# Patient Record
Sex: Female | Born: 1960 | Race: White | Hispanic: No | Marital: Married | Smoking: Current every day smoker
Health system: Southern US, Community
[De-identification: ages and names within clinical notes are randomized; demographics above are authoritative.]

## PROBLEM LIST (undated history)

## (undated) DIAGNOSIS — K219 Gastro-esophageal reflux disease without esophagitis: Secondary | ICD-10-CM

## (undated) DIAGNOSIS — E039 Hypothyroidism, unspecified: Secondary | ICD-10-CM

## (undated) DIAGNOSIS — Z8719 Personal history of other diseases of the digestive system: Secondary | ICD-10-CM

## (undated) DIAGNOSIS — IMO0002 Reserved for concepts with insufficient information to code with codable children: Secondary | ICD-10-CM

## (undated) DIAGNOSIS — N823 Fistula of vagina to large intestine: Secondary | ICD-10-CM

## (undated) DIAGNOSIS — N183 Chronic kidney disease, stage 3 unspecified: Secondary | ICD-10-CM

## (undated) DIAGNOSIS — T451X5A Adverse effect of antineoplastic and immunosuppressive drugs, initial encounter: Secondary | ICD-10-CM

## (undated) DIAGNOSIS — M4302 Spondylolysis, cervical region: Secondary | ICD-10-CM

## (undated) DIAGNOSIS — F1120 Opioid dependence, uncomplicated: Secondary | ICD-10-CM

## (undated) DIAGNOSIS — R234 Changes in skin texture: Secondary | ICD-10-CM

## (undated) DIAGNOSIS — I671 Cerebral aneurysm, nonruptured: Secondary | ICD-10-CM

## (undated) DIAGNOSIS — C50919 Malignant neoplasm of unspecified site of unspecified female breast: Secondary | ICD-10-CM

## (undated) DIAGNOSIS — M47816 Spondylosis without myelopathy or radiculopathy, lumbar region: Secondary | ICD-10-CM

## (undated) DIAGNOSIS — M542 Cervicalgia: Secondary | ICD-10-CM

## (undated) DIAGNOSIS — Z87442 Personal history of urinary calculi: Secondary | ICD-10-CM

## (undated) DIAGNOSIS — J189 Pneumonia, unspecified organism: Secondary | ICD-10-CM

## (undated) DIAGNOSIS — Z972 Presence of dental prosthetic device (complete) (partial): Secondary | ICD-10-CM

## (undated) DIAGNOSIS — R0600 Dyspnea, unspecified: Secondary | ICD-10-CM

## (undated) DIAGNOSIS — Z973 Presence of spectacles and contact lenses: Secondary | ICD-10-CM

## (undated) DIAGNOSIS — R239 Unspecified skin changes: Secondary | ICD-10-CM

## (undated) DIAGNOSIS — I2510 Atherosclerotic heart disease of native coronary artery without angina pectoris: Secondary | ICD-10-CM

## (undated) DIAGNOSIS — U071 COVID-19: Secondary | ICD-10-CM

## (undated) DIAGNOSIS — F419 Anxiety disorder, unspecified: Secondary | ICD-10-CM

## (undated) DIAGNOSIS — G620 Drug-induced polyneuropathy: Secondary | ICD-10-CM

## (undated) DIAGNOSIS — G43709 Chronic migraine without aura, not intractable, without status migrainosus: Secondary | ICD-10-CM

## (undated) DIAGNOSIS — J439 Emphysema, unspecified: Secondary | ICD-10-CM

## (undated) DIAGNOSIS — F17210 Nicotine dependence, cigarettes, uncomplicated: Secondary | ICD-10-CM

## (undated) DIAGNOSIS — Z9221 Personal history of antineoplastic chemotherapy: Secondary | ICD-10-CM

## (undated) HISTORY — PX: OTHER SURGICAL HISTORY: SHX169

## (undated) HISTORY — PX: EYE SURGERY: SHX253

## (undated) HISTORY — PX: CYSTOSCOPY/URETEROSCOPY/HOLMIUM LASER/STENT PLACEMENT: SHX6546

---

## 1990-07-20 HISTORY — PX: APPENDECTOMY: SHX54

## 1994-07-20 HISTORY — PX: TOTAL ABDOMINAL HYSTERECTOMY W/ BILATERAL SALPINGOOPHORECTOMY: SHX83

## 1997-07-20 HISTORY — PX: CHOLECYSTECTOMY: SHX55

## 1997-12-26 ENCOUNTER — Ambulatory Visit (HOSPITAL_COMMUNITY): Admission: RE | Admit: 1997-12-26 | Discharge: 1997-12-26 | Payer: Self-pay | Admitting: Obstetrics and Gynecology

## 1998-01-02 ENCOUNTER — Other Ambulatory Visit: Admission: RE | Admit: 1998-01-02 | Discharge: 1998-01-02 | Payer: Self-pay | Admitting: Obstetrics and Gynecology

## 1999-07-21 DIAGNOSIS — Z8719 Personal history of other diseases of the digestive system: Secondary | ICD-10-CM

## 1999-07-21 HISTORY — DX: Personal history of other diseases of the digestive system: Z87.19

## 1999-09-17 ENCOUNTER — Inpatient Hospital Stay (HOSPITAL_COMMUNITY): Admission: EM | Admit: 1999-09-17 | Discharge: 1999-09-18 | Payer: Self-pay | Admitting: Emergency Medicine

## 1999-11-07 ENCOUNTER — Ambulatory Visit (HOSPITAL_COMMUNITY): Admission: RE | Admit: 1999-11-07 | Discharge: 1999-11-07 | Payer: Self-pay | Admitting: Gastroenterology

## 1999-11-07 ENCOUNTER — Encounter: Payer: Self-pay | Admitting: Gastroenterology

## 1999-11-26 ENCOUNTER — Encounter: Payer: Self-pay | Admitting: Gastroenterology

## 1999-11-26 ENCOUNTER — Ambulatory Visit (HOSPITAL_COMMUNITY): Admission: RE | Admit: 1999-11-26 | Discharge: 1999-11-26 | Payer: Self-pay | Admitting: Gastroenterology

## 2000-01-04 ENCOUNTER — Emergency Department (HOSPITAL_COMMUNITY): Admission: EM | Admit: 2000-01-04 | Discharge: 2000-01-04 | Payer: Self-pay

## 2000-01-15 ENCOUNTER — Encounter: Admission: RE | Admit: 2000-01-15 | Discharge: 2000-01-15 | Payer: Self-pay | Admitting: Otolaryngology

## 2000-01-15 ENCOUNTER — Encounter: Payer: Self-pay | Admitting: Otolaryngology

## 2000-05-06 ENCOUNTER — Ambulatory Visit (HOSPITAL_COMMUNITY): Admission: RE | Admit: 2000-05-06 | Discharge: 2000-05-06 | Payer: Self-pay | Admitting: Gastroenterology

## 2000-05-19 ENCOUNTER — Encounter: Admission: RE | Admit: 2000-05-19 | Discharge: 2000-05-19 | Payer: Self-pay | Admitting: Family Medicine

## 2000-05-19 ENCOUNTER — Encounter: Payer: Self-pay | Admitting: Family Medicine

## 2000-08-16 ENCOUNTER — Ambulatory Visit (HOSPITAL_COMMUNITY): Admission: RE | Admit: 2000-08-16 | Discharge: 2000-08-16 | Payer: Self-pay | Admitting: Gastroenterology

## 2000-10-07 ENCOUNTER — Encounter: Payer: Self-pay | Admitting: Surgery

## 2000-10-11 HISTORY — PX: LAPAROSCOPIC NISSEN FUNDOPLICATION: SHX1932

## 2000-10-12 ENCOUNTER — Inpatient Hospital Stay (HOSPITAL_COMMUNITY): Admission: EM | Admit: 2000-10-12 | Discharge: 2000-10-13 | Payer: Self-pay | Admitting: Surgery

## 2001-04-18 ENCOUNTER — Encounter: Payer: Self-pay | Admitting: Otolaryngology

## 2001-04-18 ENCOUNTER — Encounter: Admission: RE | Admit: 2001-04-18 | Discharge: 2001-04-18 | Payer: Self-pay | Admitting: Otolaryngology

## 2001-04-19 ENCOUNTER — Encounter: Payer: Self-pay | Admitting: Internal Medicine

## 2001-04-19 ENCOUNTER — Encounter: Admission: RE | Admit: 2001-04-19 | Discharge: 2001-04-19 | Payer: Self-pay | Admitting: Internal Medicine

## 2001-04-21 ENCOUNTER — Ambulatory Visit (HOSPITAL_COMMUNITY): Admission: RE | Admit: 2001-04-21 | Discharge: 2001-04-21 | Payer: Self-pay | Admitting: Internal Medicine

## 2001-04-26 ENCOUNTER — Ambulatory Visit: Admission: RE | Admit: 2001-04-26 | Discharge: 2001-04-26 | Payer: Self-pay | Admitting: Pulmonary Disease

## 2001-07-07 ENCOUNTER — Ambulatory Visit: Admission: RE | Admit: 2001-07-07 | Discharge: 2001-07-07 | Payer: Self-pay | Admitting: Pulmonary Disease

## 2002-08-23 ENCOUNTER — Ambulatory Visit (HOSPITAL_BASED_OUTPATIENT_CLINIC_OR_DEPARTMENT_OTHER): Admission: RE | Admit: 2002-08-23 | Discharge: 2002-08-23 | Payer: Self-pay | Admitting: Internal Medicine

## 2003-01-18 ENCOUNTER — Inpatient Hospital Stay (HOSPITAL_COMMUNITY): Admission: EM | Admit: 2003-01-18 | Discharge: 2003-01-21 | Payer: Self-pay | Admitting: Emergency Medicine

## 2003-01-18 ENCOUNTER — Encounter: Payer: Self-pay | Admitting: Internal Medicine

## 2003-01-19 ENCOUNTER — Encounter: Payer: Self-pay | Admitting: Internal Medicine

## 2003-01-21 ENCOUNTER — Encounter (INDEPENDENT_AMBULATORY_CARE_PROVIDER_SITE_OTHER): Payer: Self-pay | Admitting: Specialist

## 2003-02-20 ENCOUNTER — Observation Stay (HOSPITAL_COMMUNITY): Admission: EM | Admit: 2003-02-20 | Discharge: 2003-02-21 | Payer: Self-pay | Admitting: Emergency Medicine

## 2005-01-28 ENCOUNTER — Inpatient Hospital Stay (HOSPITAL_COMMUNITY): Admission: AD | Admit: 2005-01-28 | Discharge: 2005-01-29 | Payer: Self-pay | Admitting: Internal Medicine

## 2005-01-29 ENCOUNTER — Ambulatory Visit: Payer: Self-pay | Admitting: Psychiatry

## 2005-12-25 ENCOUNTER — Emergency Department (HOSPITAL_COMMUNITY): Admission: EM | Admit: 2005-12-25 | Discharge: 2005-12-25 | Payer: Self-pay | Admitting: Emergency Medicine

## 2006-09-25 ENCOUNTER — Emergency Department (HOSPITAL_COMMUNITY): Admission: EM | Admit: 2006-09-25 | Discharge: 2006-09-25 | Payer: Self-pay | Admitting: Emergency Medicine

## 2009-07-28 ENCOUNTER — Emergency Department (HOSPITAL_COMMUNITY): Admission: EM | Admit: 2009-07-28 | Discharge: 2009-07-28 | Payer: Self-pay | Admitting: Family Medicine

## 2009-07-28 ENCOUNTER — Emergency Department (HOSPITAL_COMMUNITY): Admission: EM | Admit: 2009-07-28 | Discharge: 2009-07-28 | Payer: Self-pay | Admitting: Emergency Medicine

## 2010-12-05 NOTE — Procedures (Signed)
[place]  Patient:    Stacy Fernandez, Stacy Fernandez                  MRN: [id number] Proc. Date: 08/16/00 Adm. Date:  [id number] Disc. Date: [id number] Attending:  Charmaine Downs CC:         Ulyess Mort, M.D. [facility]   Procedure Report  PROCEDURE PERFORMED:  Esophageal manometry.  Upper esophageal sphincter:  There appears to be normal coordination between pharyngeal contractions and cricopharyngeal relaxation.  Lower esophageal sphincter:  Lower esophageal sphincter pressure is elevated to 49 mmHg with normal relaxation to swallowing.  Motility pattern:  There is a mixture of aperistaltic and peristaltic waves (approximately 50%) throughout the length of the esohpagus with wet and dry swallows.  Mean amplitude of contractions is elevated to 195 mmHg, normal being less than 180.  There were no repetitive, spontaneous, or prolonged contractions to suggest esophageal spasm.  ASSESSMENT:  This is a nonspecific esophageal motility disorder which is probably secondary to the patients using metoclopramide therapy. DD:  09/13/00 TD:  09/14/00 Job: [id number]

## 2010-12-05 NOTE — Procedures (Signed)
[facility]  Patient:    Stacy Fernandez, Stacy Fernandez Visit Number: [id number] MRN: [id number]          Service Type: DSU Location: CARD Attending Physician:  Merwyn Katos Dictated by:   Oley Balm Sung Amabile, M.D. [facility] Proc. Date: 07/07/01 Admit Date:  07/07/2001 Discharge Date: 07/07/2001                             Procedure Report  PROCEDURE:  Bronchoscopy.  DESCRIPTION OF PROCEDURE:  1960/08/08  INDICATIONS:  Persistent atelectasis versus scarring adjacent to the horizontal fissure--rule out endobronchial obstruction.  PREMEDICATION:  Fentanyl 100 mcg IV, Versed 4 mg IV.  ANESTHESIA:  Topical anesthesia was applied to the nose and throat. 60 cc of 1% lidocaine were used during the course of the procedure.  DESCRIPTION OF PROCEDURE:  After adequate sedation and anesthesia, the bronchoscope was introduced via the right naris and advanced into the posterior pharynx. This demonstrated a normal upper airway anatomy. The vocal cords moved symmetrically. Further anesthesia was achieved with 1% lidocaine and the scope was advanced into the trachea. Complete airway anesthesia was achieved with 1% lidocaine and a thorough airway examination was performed. This demonstrated normal segmental airway anatomy. Bronchial mucosal was notably normal. There were no endobronchial lesions such as tumors or foreign bodies. There was no evidence of bleeding. There were scant secretions. After completion of the airway examination, the procedure was terminated. No specimens were submitted. Dictated by:   Oley Balm Sung Amabile, M.D. [facility] Attending Physician:  Merwyn Katos DD:  07/15/01 TD:  07/16/01 Job: [id number]

## 2010-12-05 NOTE — Op Note (Signed)
Stacy Fernandez, Stacy Fernandez                  ACCOUNT NO.:  [id number]   MEDICAL RECORD NO.:  [id number]                   PATIENT TYPE:  INP   LOCATION:  0342                                 FACILITY:  [facility]   PHYSICIAN:  Lina Sar, M.D. [facility]               DATE OF BIRTH:  Nov 24, 1960   DATE OF PROCEDURE:  01/21/2003  DATE OF DISCHARGE:                                 OPERATIVE REPORT   NAME OF PROCEDURE:  Colonoscopy.   ENDOSCOPIST:  Hedwig Morton. Juanda Chance, M.D. [facility]   INDICATIONS:  This 50 year old white female was admitted with severe lower  abdominal pain mostly in the left lower quadrant and hematochezia which has  continued throughout the hospitalization.  A CT scan of the abdomen was  negative.  All her chemistries were negative.  She gave a history of  questionable Crohn's disease.  She is undergoing colonoscopy because of  continued abdominal pain, but negative laboratory tests with radiographic  studies.   ENDOSCOPE:  Olympus single-channel video endoscope.   SEDATION:  1. Versed 5 mg IV.  2. Fentanyl 50 mcg IV.   DESCRIPTION OF PROCEDURE:  The Olympus single-channel video endoscope was  passed under direct vision in the rectum to the sigmoid colon.  The patient  was monitored by pulse oximetry.  Oxygen saturations were 89-95% on 2 L of  nasal oxygen.  Her prep was excellent.   Ther anal canal showed erosions in the anal canal which were most likely  related to the prep.  There was no active bleeding.  Retroflexion of the  endoscope in the rectum showed no significant hemorrhoids.   The sigmoid colon mucosa was normal.  The colonoscope passed easily through  the sigmoid colon which showed normal appearing folds, no diverticulosis.  The descending colon, splenic flexure, transverse colon, and hepatic flexure  were unremarkable.  There was normal submucosal vascular pattern.  There was  no abnormal tortuosity.  The right colon was reached without difficulty.  The cecal  pouch appeared normal.  It was easily distensible.  The appendical  opening was identified.  The ileocecal valve appeared normal.  The terminal  ileum was not entered.   The colonoscope was then retracted and the colon decompressed.  Random  biopsies were taken from the right transverse colon to rule out microscopic  colitis.   The patient tolerated the procedure well.   IMPRESSION:  1. Essentially normal colonoscopy to the cecum, status post random biopsies     of the left and right colon.  2. Minor anal erosions.    PLAN:  There is no structural abnormality of the colon to explain the  patient's symptoms which may due to irritable bowel syndrome.  Since all her  chemistries sent and CT scan are normal, she will be treated for IBS with  antispasmodics, dietary modifications of high-fiber diet and fiber  supplements.  She will follow up with Dr. Lovell Sheehan.  Lina Sar, M.D. [facility]    DB/MEDQ  D:  01/21/2003  T:  01/21/2003  Job:  [id number]   cc:   Della Goo, M.D.

## 2010-12-05 NOTE — Op Note (Signed)
[facility]  Patient:    Stacy Fernandez, Stacy Fernandez               MRN: [id number] Proc. Date: 10/11/00 Adm. Date:  [id number] Attending:  Katha Cabal CC:         Tammy R. Collins Scotland, M.D.  Ulyess Mort, M.D. 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  PROCEDURE:  Laparoscopic Nissen fundoplication over a #56 dilator, closure of the crura.  SURGEON:  Dr. Daphine Deutscher.  ASSISTANT:  Dr. Derrell Lolling.  DESCRIPTION OF PROCEDURE:  The patient was taken to room one, given general anesthesia. The abdomen was prepped with Betadine and draped sterilely. A longitudinal incision was made just above the umbilicus and through a pursestring suture and using Hasson technique, the Hasson cannula was inserted. The abdomen was insufflated. There were some adhesions in the right upper quadrant from a previous cholecystectomy, these were taken down. A 5 mm was placed in the upper abdomen, 10/11 in the left upper quadrant, another 10/11 on the left side for the camera and a right sided port for the operation.  I incised the gastrohepatic ligament first and went up and then dissected the right crus. I carried this anteriorly across the esophagus and down and dissected the left crus.  Next I went over and took down short gastric vessels putting clips on some large short gastrics and then using the harmonic on the remaining portion. I carried this up to the EG junction and had completely dissected the hiatus at this point. I easily created a window and we enlarged the window and put a Penrose drain around this and used that to retract the esophagus. With that in place, I put a single suture through the crura but the knot broke in tying. A second one was placed in the same area. Again  this was a simple suture placed with the  endostitch and this was tied down extracorporeally and approximated the crura nicely. There was essentially no hiatal hernia present. A 56 dilator was passed by Dr. Dorathy Kinsman easily. The wrap was brought around and a contiguous portion of the fundus was identified. I then sutured this to itself and to the esophagus with three sutures using the endostitch. The upper two were tied intracorporeally and the third one was tied extracorporeally. These had clips put on each of these. There was noted to be a small capsular evulsion on the lower portion of the spleen and a piece of Surgicel had been placed on this and it was not bleeding. The wrap looked good and healthy and appeared to be in good position. The patient tolerated the procedure well. I then closed the Hasson port under direct vision. We then removed the remaining ports and injected them with Marcaine. The skin was closed with 4-0 Vicryl with benzoin and Steri-Strips. The patient seemed to tolerate the procedure well and was taken to the recovery room in satisfactory condition. DD:  10/11/00 TD:  10/11/00 Job: [id number]

## 2010-12-05 NOTE — Discharge Summary (Signed)
   Stacy Fernandez, Stacy Fernandez                  ACCOUNT NO.:  [id number]   MEDICAL RECORD NO.:  [id number]                   PATIENT TYPE:  INP   LOCATION:  0342                                 FACILITY:  [facility]   PHYSICIAN:  Della Goo, M.D.              DATE OF BIRTH:  02-Feb-1961   DATE OF ADMISSION:  01/18/2003  DATE OF DISCHARGE:  01/21/2003                                 DISCHARGE SUMMARY   FINAL DIAGNOSES:  1. Irritable bowel syndrome.  2. Rectal bleeding due to rectal area ulcers seen on colonoscopy January 21, 2003.  3. Anemia of iron deficiency.  4. Chronic obstructive pulmonary disease/emphysema.  5. Tobacco abuse.  6. Anxiety/depression.   HOSPITAL COURSE:  A 50 year old Caucasian female with complaints of  worsening abdominal pain and episodic rectal bleeding referred from the  office for direct admission due to her symptoms.  The patient also had  complaints of dizziness, weakness, and orthostasis and was unable to hold  down foods and liquids.  The patient was admitted to [facility].  A CT scan of the abdomen and KUB were performed.  KUB revealed nonspecific  bowel gas pattern, no ileus, no obstruction.  CT scan performed also was  unremarkable.  Lina Sar, M.D. [facility] was consulted for evaluation who saw  patient on January 18, 2003 and made recommendations and plans for colonoscopy.  The patient was placed on bowel rest, IV fluids, pain medications to control  pain symptoms.  She was also placed on nausea medications and a clear liquid  diet initially which was advanced as tolerated.  The patient was made n.p.o.  prior to her colonoscopy which was performed on the a.m. of January 21, 2003.  This procedure was performed without complications, findings of which were  negative except for findings of distal rectal area ulcerations x3.  Biopsies  were obtained and pathology was pending on discharge.  The patient was  discharged from the hospital on January 21, 2003  to follow up in the office for  reevaluation in five to seven days.  The patient's symptoms were consistent  with irritable bowel syndrome.  Medical therapy was started with  glycopyrrolate for antispasmodic therapy.  The patient was also placed on  Anusol hydrocortisone ointment for the rectal ulcerations.  The patient's  hemoglobin level was stable.  Her vitals were stable and she was tolerating  a regular diet and was discharged to home January 21, 2003.                                               Della Goo, M.D.    HJ/MEDQ  D:  02/08/2003  T:  02/08/2003  Job:  [id number]

## 2010-12-05 NOTE — Procedures (Signed)
[place]  Patient:    Stacy Fernandez, Stacy Fernandez                  MRN: [id number] Proc. Date: 08/23/00 Adm. Date:  [id number] Disc. Date: [id number] Attending:  Charmaine Downs CC:         Ulyess Mort, M.D. [facility]             Matthew B. Daphine Deutscher, M.D.                           Procedure Report  PROCEDURE:  Esophageal Manometry was performed in the usual pull-through technique.  FINDINGS: 1. Upper esophageal sphincter pressure, contractions, and relaxation were    normal. 2. In the esophageal body, there were normal peristaltic contractions in the    upper esophageal body.  In the lower esophageal body, there were four out    of eight nontransmitted contractions.  Three were retrograde.  Amplitude    and duration were normal. 3. LES resting pressure was 48.6 with 98% relaxation.  IMPRESSION:  Very nonspecific motor disorder of the esophagus. DD:  08/23/00 TD:  08/23/00 Job: [id number]

## 2012-01-27 ENCOUNTER — Other Ambulatory Visit: Payer: Self-pay | Admitting: Family Medicine

## 2012-01-27 DIAGNOSIS — Z1231 Encounter for screening mammogram for malignant neoplasm of breast: Secondary | ICD-10-CM

## 2012-02-09 ENCOUNTER — Ambulatory Visit
Admission: RE | Admit: 2012-02-09 | Discharge: 2012-02-09 | Disposition: A | Discharge status: Home / Self Care | Payer: BC Managed Care – PPO | Source: Ambulatory Visit | Attending: Family Medicine | Admitting: Family Medicine

## 2012-02-09 DIAGNOSIS — Z1231 Encounter for screening mammogram for malignant neoplasm of breast: Secondary | ICD-10-CM

## 2012-03-11 ENCOUNTER — Other Ambulatory Visit: Payer: Self-pay | Admitting: Gastroenterology

## 2012-09-29 ENCOUNTER — Other Ambulatory Visit: Payer: Self-pay | Admitting: Neurology

## 2012-10-03 ENCOUNTER — Other Ambulatory Visit: Payer: Self-pay | Admitting: Neurology

## 2012-10-03 DIAGNOSIS — R51 Headache: Secondary | ICD-10-CM

## 2012-10-06 ENCOUNTER — Ambulatory Visit (INDEPENDENT_AMBULATORY_CARE_PROVIDER_SITE_OTHER): Payer: BC Managed Care – PPO

## 2012-10-06 ENCOUNTER — Other Ambulatory Visit: Payer: Self-pay | Admitting: Neurology

## 2012-10-06 DIAGNOSIS — R51 Headache: Secondary | ICD-10-CM

## 2012-10-06 DIAGNOSIS — R42 Dizziness and giddiness: Secondary | ICD-10-CM

## 2012-10-06 DIAGNOSIS — Z0289 Encounter for other administrative examinations: Secondary | ICD-10-CM

## 2012-10-07 ENCOUNTER — Telehealth: Payer: Self-pay | Admitting: Neurology

## 2012-10-07 NOTE — Procedures (Signed)
[facility]  NEUROIMAGING REPORT   STUDY DATE: 10/06/12 PATIENT NAME: Stacy Fernandez DOB: [birth year] MRN: [id number]  ORDERING CLINICIAN: York Spaniel, MD  CLINICAL HISTORY: 52 year old female with headaches and dizziness.  EXAM: MRA head (without)  TECHNIQUE: MR angiogram of the head was obtained utilizing 3D time of flight sequences from below the vertebrobasilar junction up to the intracranial vasculature without contrast.  Computerized reconstructions were obtained. CONTRAST: no IMAGING SITE: [facility] [address]   FINDINGS:  This study is of adequate technical quality. Flow signal of the bilateral internal carotid arteries have no stenosis. The bilateral middle and anterior cerebral arteries have no stenosis. Trifurcation of the anterior cerebral arteries (A2 segments) is a normal variant. The bilateral vertebral, basilar, bilateral posterior cerebral arteries have no stenosis. Small 2mm projection near the left supraclinoid internal carotid artery may represent small aneurysm vs. infundibulum of hypoplastic posterior communicating artery.   IMPRESSION:  Abnormal MRA head (without) demonstrating: 1. Small 2mm projection of the left supraclinoid internal carotid artery may represent small aneurysm vs. infundibulum.  2. Remainder of medium-large sized intracranial arteries are unremarkable.   INTERPRETING PHYSICIAN:  Suanne Marker, MD Certified in Neurology, Neurophysiology and Neuroimaging  [facility] [address], [address] [place], [place] [address] [phone]

## 2012-10-07 NOTE — Procedures (Signed)
[facility]  NEUROIMAGING REPORT   STUDY DATE: 10/06/12 PATIENT NAME: Stacy Fernandez DOB: 10-10-1960 MRN: [id number]  ORDERING CLINICIAN: York Spaniel, MD  CLINICAL HISTORY: 52 year old female with headaches and dizziness.  EXAM: MRI brain (without)  TECHNIQUE: MRI of the brain without contrast was obtained utilizing 5 mm axial slices with T1, T2, T2 flair, T2 star gradient echo and diffusion weighted views.  T1 sagittal and T2 coronal views were obtained. CONTRAST: no IMAGING SITE: [facility] [address]   FINDINGS:  No abnormal lesions are seen on diffusion-weighted views to suggest acute ischemia. The cortical sulci, fissures and cisterns are normal in size and appearance. Lateral, third and fourth ventricle are normal in size and appearance. No extra-axial fluid collections are seen. No evidence of mass effect or midline shift.    On sagittal views the posterior fossa, pituitary gland and corpus callosum are unremarkable. No evidence of intracranial hemorrhage on gradient-echo views. The orbits and their contents, paranasal sinuses and calvarium are unremarkable.  Intracranial flow voids are present.  IMPRESSION:  Normal MRI brain (without).   INTERPRETING PHYSICIAN:  Suanne Marker, MD Certified in Neurology, Neurophysiology and Neuroimaging  [facility] [address], [address] [place], [place] [address] [phone]

## 2012-10-07 NOTE — Telephone Encounter (Signed)
I called the patient. She has had MRI evaluation of the brain, and this is normal. The patient is to have a carotid Doppler study in the near future. I'll contact her when I get that result.

## 2012-10-08 ENCOUNTER — Telehealth: Payer: Self-pay | Admitting: Neurology

## 2012-10-08 NOTE — Telephone Encounter (Signed)
I called the patient. The MRI brain is normal. 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 Doppler is pending.

## 2012-10-11 ENCOUNTER — Other Ambulatory Visit: Payer: BC Managed Care – PPO

## 2012-10-18 ENCOUNTER — Ambulatory Visit (INDEPENDENT_AMBULATORY_CARE_PROVIDER_SITE_OTHER): Payer: Self-pay

## 2012-10-18 DIAGNOSIS — R42 Dizziness and giddiness: Secondary | ICD-10-CM

## 2012-10-24 ENCOUNTER — Telehealth: Payer: Self-pay | Admitting: Neurology

## 2012-10-24 MED ORDER — PROPRANOLOL HCL 10 MG PO TABS: ORAL_TABLET | ORAL | Status: DC

## 2012-10-24 NOTE — Telephone Encounter (Signed)
I called patient. The carotid Doppler study is unremarkable. The patient continues to have headaches 2 or 3 times a week. I will start propranolol. The patient was on Topamax without benefit. She is also on gabapentin. The increase in the Effexor has not helped her.

## 2012-11-15 ENCOUNTER — Telehealth: Payer: Self-pay

## 2012-11-15 MED ORDER — PROPRANOLOL HCL 40 MG PO TABS: 40.0000 mg | ORAL_TABLET | Freq: Two times a day (BID) | ORAL | Status: DC

## 2012-11-15 NOTE — Telephone Encounter (Signed)
I called patient. The patient continues to have relatively frequent headaches. The patient also has some left-sided neck and shoulder discomfort. The patient got some benefit from the low-dose propranolol, I'll increase the dose to 40 mg twice daily. The patient may be a candidate for Botox injections in the future.

## 2012-11-15 NOTE — Telephone Encounter (Signed)
Patient called and left message in clinic saying the medication prescribed is not helping and she is having frequent headaches.  She would like something else prescribed or suggestions as to what else she needs to do.

## 2012-12-13 ENCOUNTER — Encounter (INDEPENDENT_AMBULATORY_CARE_PROVIDER_SITE_OTHER): Payer: BC Managed Care – PPO

## 2012-12-13 DIAGNOSIS — R079 Chest pain, unspecified: Secondary | ICD-10-CM

## 2012-12-13 DIAGNOSIS — R0602 Shortness of breath: Secondary | ICD-10-CM

## 2012-12-13 LAB — PULMONARY FUNCTION TEST

## 2012-12-22 ENCOUNTER — Encounter: Payer: Self-pay | Admitting: Cardiovascular Disease

## 2012-12-25 ENCOUNTER — Inpatient Hospital Stay (HOSPITAL_COMMUNITY)
Admission: EM | Admit: 2012-12-25 | Discharge: 2012-12-27 | DRG: 124 | Disposition: A | Discharge status: Home / Self Care | Payer: BC Managed Care – PPO | Attending: Cardiovascular Disease | Admitting: Cardiovascular Disease

## 2012-12-25 ENCOUNTER — Inpatient Hospital Stay (HOSPITAL_COMMUNITY): Payer: BC Managed Care – PPO

## 2012-12-25 ENCOUNTER — Emergency Department (HOSPITAL_COMMUNITY): Payer: BC Managed Care – PPO

## 2012-12-25 ENCOUNTER — Encounter (HOSPITAL_COMMUNITY): Payer: Self-pay | Admitting: Emergency Medicine

## 2012-12-25 DIAGNOSIS — E039 Hypothyroidism, unspecified: Secondary | ICD-10-CM | POA: Diagnosis present

## 2012-12-25 DIAGNOSIS — I200 Unstable angina: Principal | ICD-10-CM | POA: Diagnosis present

## 2012-12-25 DIAGNOSIS — Z8249 Family history of ischemic heart disease and other diseases of the circulatory system: Secondary | ICD-10-CM

## 2012-12-25 DIAGNOSIS — R079 Chest pain, unspecified: Secondary | ICD-10-CM

## 2012-12-25 DIAGNOSIS — Z8679 Personal history of other diseases of the circulatory system: Secondary | ICD-10-CM

## 2012-12-25 DIAGNOSIS — F17200 Nicotine dependence, unspecified, uncomplicated: Secondary | ICD-10-CM | POA: Diagnosis present

## 2012-12-25 DIAGNOSIS — Z720 Tobacco use: Secondary | ICD-10-CM | POA: Diagnosis present

## 2012-12-25 DIAGNOSIS — Z7982 Long term (current) use of aspirin: Secondary | ICD-10-CM

## 2012-12-25 HISTORY — DX: Hypothyroidism, unspecified: E03.9

## 2012-12-25 LAB — URINALYSIS, ROUTINE W REFLEX MICROSCOPIC
Bilirubin Urine: NEGATIVE
Glucose, UA: NEGATIVE mg/dL
Ketones, ur: NEGATIVE mg/dL
Nitrite: NEGATIVE
Protein, ur: NEGATIVE mg/dL
Specific Gravity, Urine: 1.027 (ref 1.005–1.030)
Urobilinogen, UA: 0.2 mg/dL (ref 0.0–1.0)
pH: 5 (ref 5.0–8.0)

## 2012-12-25 LAB — POCT I-STAT, CHEM 8
Chloride: 108 mEq/L (ref 96–112)
Glucose, Bld: 93 mg/dL (ref 70–99)
HCT: 33 % — ABNORMAL LOW (ref 36.0–46.0)
Potassium: 3.8 mEq/L (ref 3.5–5.1)
Sodium: 142 mEq/L (ref 135–145)

## 2012-12-25 LAB — COMPREHENSIVE METABOLIC PANEL
ALT: 15 U/L (ref 0–35)
AST: 19 U/L (ref 0–37)
CO2: 25 mEq/L (ref 19–32)
Calcium: 9.2 mg/dL (ref 8.4–10.5)
Chloride: 108 mEq/L (ref 96–112)
GFR calc non Af Amer: 79 mL/min — ABNORMAL LOW (ref >90)
Sodium: 140 mEq/L (ref 135–145)

## 2012-12-25 LAB — CBC
HCT: 34.1 % — ABNORMAL LOW (ref 36.0–46.0)
Hemoglobin: 12.2 g/dL (ref 12.0–15.0)
MCH: 32.3 pg (ref 26.0–34.0)
MCHC: 35.8 g/dL (ref 30.0–36.0)
MCV: 90.2 fL (ref 78.0–100.0)
Platelets: 179 10*3/uL (ref 150–400)
RBC: 3.78 MIL/uL — ABNORMAL LOW (ref 3.87–5.11)
RDW: 12.4 % (ref 11.5–15.5)
WBC: 8.5 10*3/uL (ref 4.0–10.5)

## 2012-12-25 LAB — POCT I-STAT TROPONIN I
Troponin i, poc: 0.01 ng/mL (ref 0.00–0.08)
Troponin i, poc: 0.01 ng/mL (ref 0.00–0.08)

## 2012-12-25 LAB — APTT: aPTT: 30 seconds (ref 24–37)

## 2012-12-25 LAB — URINE MICROSCOPIC-ADD ON

## 2012-12-25 LAB — TROPONIN I: Troponin I: <0.3 ng/mL (ref <0.30)

## 2012-12-25 MED ORDER — HEPARIN (PORCINE) IN NACL 100-0.45 UNIT/ML-% IJ SOLN
800.0000 [IU]/h | INTRAMUSCULAR | Status: DC
  Administered 2012-12-26: 750 [IU]/h via INTRAVENOUS
  Filled 2012-12-25 (×2): qty 250

## 2012-12-25 MED ORDER — NITROGLYCERIN IN D5W 200-5 MCG/ML-% IV SOLN
5.0000 ug/min | INTRAVENOUS | Status: DC
  Administered 2012-12-25: 5 ug/min via INTRAVENOUS
  Administered 2012-12-26: 20 ug/min via INTRAVENOUS
  Filled 2012-12-25: qty 250

## 2012-12-25 MED ORDER — HEPARIN BOLUS VIA INFUSION
3000.0000 [IU] | Freq: Once | INTRAVENOUS | Status: AC
  Administered 2012-12-26: 3000 [IU] via INTRAVENOUS

## 2012-12-25 MED ORDER — MORPHINE SULFATE 4 MG/ML IJ SOLN
4.0000 mg | Freq: Once | INTRAMUSCULAR | Status: AC
  Administered 2012-12-25: 4 mg via INTRAVENOUS
  Filled 2012-12-25: qty 1

## 2012-12-25 MED ORDER — NITROGLYCERIN 0.4 MG SL SUBL
0.4000 mg | SUBLINGUAL_TABLET | SUBLINGUAL | Status: DC | PRN
  Filled 2012-12-25: qty 25

## 2012-12-25 MED ORDER — ASPIRIN 81 MG PO CHEW: 324.0000 mg | CHEWABLE_TABLET | Freq: Once | ORAL | Status: DC

## 2012-12-25 MED ORDER — SODIUM CHLORIDE 0.9 % IV SOLN
1000.0000 mL | INTRAVENOUS | Status: DC
  Administered 2012-12-25: 1000 mL via INTRAVENOUS

## 2012-12-25 NOTE — ED Provider Notes (Signed)
History    CSN: [id number] Arrival date & time 12/25/12  1941 First MD Initiated Contact with Patient 12/25/12 1954      Chief Complaint  Patient presents with  . Chest Pain    HPI Comments: Chest pain started about 6:30 pm suddenly while at rest.  Patient is a 52 y.o. female presenting with chest pain. The history is provided by the patient.  Chest Pain Pain location:  L chest Pain radiates to:  Upper back and L arm Pain radiates to the back: yes   Pain severity:  Severe Onset quality:  Sudden Timing:  Constant Progression:  Worsening Chronicity:  Recurrent (Pt had a stress test two weeks ago.  sHe is scheduled to follow up with her cardiologist on tuesday.) Relieved by:  Nothing Ineffective treatments:  Aspirin Associated symptoms: nausea and shortness of breath   Associated symptoms: no cough and not vomiting   Risk factors: smoking   Risk factors: no coronary artery disease and no hypertension    the symptoms have been constant today. Nothing seems to make it any better.   Past Medical History  Diagnosis Date  . Hypothyroidism     No past surgical history on file.  No family history on file.  History  Substance Use Topics  . Smoking status: Pt smokes cigarettes  . Smokeless tobacco: Not on file  . Alcohol Use: Not on file    OB History   Grav Para Term Preterm Abortions TAB SAB Ect Mult Living                  Review of Systems  Respiratory: Positive for shortness of breath. Negative for cough.   Cardiovascular: Positive for chest pain.  Gastrointestinal: Positive for nausea. Negative for vomiting.  All other systems reviewed and are negative.    Allergies  Contrast media  Home Medications   Current Outpatient Rx  Name  Route  Sig  Dispense  Refill  . gabapentin (NEURONTIN) 100 MG capsule   Oral   Take 300 mg by mouth at bedtime.         Marland Kitchen HYDROcodone-acetaminophen (NORCO/VICODIN) 5-325 MG per tablet   Oral   Take 1-2 tablets by mouth  every 6 (six) hours as needed for pain.         Marland Kitchen levothyroxine (SYNTHROID, LEVOTHROID) 75 MCG tablet   Oral   Take 75 mcg by mouth daily before breakfast.         . nitroGLYCERIN (NITROSTAT) 0.4 MG SL tablet   Sublingual   Place 0.4 mg under the tongue every 5 (five) minutes as needed for chest pain.         . pantoprazole (PROTONIX) 40 MG tablet   Oral   Take 40 mg by mouth 2 (two) times daily.         . propranolol (INDERAL) 40 MG tablet   Oral   Take 1 tablet (40 mg total) by mouth 2 (two) times daily.   60 tablet   1   . venlafaxine XR (EFFEXOR-XR) 150 MG 24 hr capsule   Oral   Take 150 mg by mouth daily.           BP 118/77  Pulse 69  Temp(Src) 97.6 F (36.4 C) (Oral)  Resp 16  Ht 5' 6.5" (1.689 m)  Wt 130 lb (58.968 kg)  BMI 20.67 kg/m2  SpO2 100%  Physical Exam  Nursing note and vitals reviewed. Constitutional: She appears well-developed and well-nourished. No  distress.  HENT:  Head: Normocephalic and atraumatic.  Right Ear: External ear normal.  Left Ear: External ear normal.  Eyes: Conjunctivae are normal. Right eye exhibits no discharge. Left eye exhibits no discharge. No scleral icterus.  Neck: Neck supple. No tracheal deviation present.  Cardiovascular: Normal rate, regular rhythm and intact distal pulses.   Pulmonary/Chest: Effort normal and breath sounds normal. No stridor. No respiratory distress. She has no wheezes. She has no rales.  Abdominal: Soft. Bowel sounds are normal. She exhibits no distension. There is no tenderness. There is no rebound and no guarding.  Musculoskeletal: She exhibits no edema and no tenderness.  Neurological: She is alert. She has normal strength. No sensory deficit. Cranial nerve deficit:  no gross defecits noted. She exhibits normal muscle tone. She displays no seizure activity. Coordination normal.  Skin: Skin is warm and dry. No rash noted. She is not diaphoretic.  Psychiatric: She has a normal mood and  affect.    ED Course  Procedures (including critical care time)  Rate: 70  Rhythm: normal sinus rhythm  QRS Axis: normal  Intervals: normal  ST/T Wave abnormalities: normal  Conduction Disutrbances:none  Narrative Interpretation: nl  Old EKG Reviewed: none available Medications  0.9 %  sodium chloride infusion (1,000 mLs Intravenous New Bag/Given 12/25/12 2033)  aspirin chewable tablet 324 mg (324 mg Oral Not Given 12/25/12 2034)  nitroGLYCERIN (NITROSTAT) SL tablet 0.4 mg (not administered)  morphine 4 MG/ML injection 4 mg (not administered)  nitroGLYCERIN 0.2 mg/mL in dextrose 5 % infusion (not administered)  morphine 4 MG/ML injection 4 mg (4 mg Intravenous Given 12/25/12 2034)    Labs Reviewed  CBC - Abnormal; Notable for the following:    RBC 3.78 (*)    HCT 34.1 (*)    All other components within normal limits  COMPREHENSIVE METABOLIC PANEL - Abnormal; Notable for the following:    Total Bilirubin 0.2 (*)    GFR calc non Af Amer 79 (*)    All other components within normal limits  POCT I-STAT, CHEM 8 - Abnormal; Notable for the following:    Hemoglobin 11.2 (*)    HCT 33.0 (*)    All other components within normal limits  PROTIME-INR  APTT  D-DIMER, QUANTITATIVE  URINALYSIS, ROUTINE W REFLEX MICROSCOPIC  POCT I-STAT TROPONIN I   Dg Chest Portable 1 View  12/25/2012   *RADIOLOGY REPORT*  Clinical Data: Chest pain and shortness of breath.  PORTABLE CHEST - 1 VIEW  Comparison: 02/11/2012  Findings: Stable emphysematous lung disease.  No infiltrate, edema, pneumothorax or pleural fluid is identified.  Heart size and mediastinal contours are within normal limits.  IMPRESSION: Stable emphysema.  No acute findings.   Original Report Authenticated By: Irish Lack, M.D.    1. Chest pain     MDM  Her initial evaluation in the emergency Department is unremarkable. However her symptoms are concerning for acute coronary syndrome. Patient has been given nitroglycerin, morphine  and I will start her on heparin infusion. I will add on a d-dimer to screen for pulmonary embolism,  She'll be admitted to the hospital with plans for cardiac consultation.        Celene Kras, MD 12/25/12 2236

## 2012-12-25 NOTE — H&P (Signed)
[facility] History and Physical  Stacy Fernandez ZOX:[id number] DOB: Mar 19, 1961 DOA: 12/25/2012  Referring physician: Dr. Lynelle Doctor. PCP: Herb Grays, MD  Specialists: [facility].  Chief Complaint: Chest pain.  HPI: Stacy Fernandez is a 52 y.o. female with known history of hypothyroidism and ongoing tobacco abuse presented to the ER because of chest pain. Patient states she has been having chest pain off and on for last month and a half. She gets pressure-like symptoms of her known but this evening she started having chest pain radiating to the back and left arm with dizziness and diaphoresis. Since the pain is persistent and more than usual patient came to the ER. Chest x-ray EKG and cardiac enzymes are unremarkable. Due to persistent chest pain patient was given morphine and at this time patient has been started on nitroglycerin infusion and heparin will be started after CT head is negative. Patient states she has had a stress test done 2 weeks ago at [facility] during which she had chest pain and had to be stopped. Her primary care physician had told her that she may need cardiac catheter last week and patient is to follow with a cardiologist in 2 days. Patient otherwise denies any nausea vomiting abdominal pain diarrhea focal deficits.  Review of Systems: As presented in the history of presenting illness, rest negative.  Past Medical History  Diagnosis Date  . Hypothyroidism    Past Surgical History  Procedure Laterality Date  . Cholecystectomy    . Abdominal hysterectomy    . Appendectomy     Social History:  reports that she has been smoking.  She does not have any smokeless tobacco history on file. She reports that she does not drink alcohol or use illicit drugs. At home. where does patient live-- Can do ADLs. Can patient participate in ADLs?  Allergies  Allergen Reactions  . Contrast Media (Iodinated Diagnostic Agents)     unknown     Family History  Problem Relation Age of Onset  . CAD Mother   . Colon cancer Sister   . Lung cancer Sister   . Lung cancer Brother       Prior to Admission medications   Medication Sig Start Date End Date Taking? Authorizing Provider  gabapentin (NEURONTIN) 100 MG capsule Take 300 mg by mouth at bedtime.   Yes Historical Provider, MD  HYDROcodone-acetaminophen (NORCO/VICODIN) 5-325 MG per tablet Take 1-2 tablets by mouth every 6 (six) hours as needed for pain.   Yes Historical Provider, MD  levothyroxine (SYNTHROID, LEVOTHROID) 75 MCG tablet Take 75 mcg by mouth daily before breakfast.   Yes Historical Provider, MD  nitroGLYCERIN (NITROSTAT) 0.4 MG SL tablet Place 0.4 mg under the tongue every 5 (five) minutes as needed for chest pain.   Yes Historical Provider, MD  pantoprazole (PROTONIX) 40 MG tablet Take 40 mg by mouth 2 (two) times daily.   Yes Historical Provider, MD  propranolol (INDERAL) 40 MG tablet Take 1 tablet (40 mg total) by mouth 2 (two) times daily. 11/15/12  Yes York Spaniel, MD  venlafaxine XR (EFFEXOR-XR) 150 MG 24 hr capsule Take 150 mg by mouth daily.   Yes Historical Provider, MD   Physical Exam: Filed Vitals:   12/25/12 2115 12/25/12 2130 12/25/12 2215 12/25/12 2245  BP: 104/81 109/76 118/77 111/69  Pulse: 72 71 69 67  Temp:      TempSrc:      Resp: 19 18 16 14   Height:  Weight:      SpO2: 100% 100% 100% 100%     General:  Well-developed and nourished.  Eyes: Anicteric no pallor.  ENT: No discharge from the ears eyes nose mouth.  Neck: No mass felt.  Cardiovascular: S1-S2 heard.  Respiratory: No rhonchi or crepitations.  Abdomen: Soft nontender bowel sounds present. No guarding rigidity.  Skin: No rash.  Musculoskeletal: No edema.  Psychiatric: Appears normal.  Neurologic: Alert awake oriented to time place and person. Moves all extremities.  Labs on Admission:  Basic Metabolic Panel:  Recent Labs Lab 12/25/12 2017  12/25/12 2030  NA 140 142  K 3.7 3.8  CL 108 108  CO2 25  --   GLUCOSE 96 93  BUN 18 18  CREATININE 0.84 0.90  CALCIUM 9.2  --    Liver Function Tests:  Recent Labs Lab 12/25/12 2017  AST 19  ALT 15  ALKPHOS 103  BILITOT 0.2*  PROT 6.6  ALBUMIN 3.7   No results found for this basename: LIPASE, AMYLASE,  in the last 168 hours No results found for this basename: AMMONIA,  in the last 168 hours CBC:  Recent Labs Lab 12/25/12 1956 12/25/12 2030  WBC 8.5  --   HGB 12.2 11.2*  HCT 34.1* 33.0*  MCV 90.2  --   PLT 179  --    Cardiac Enzymes: No results found for this basename: CKTOTAL, CKMB, CKMBINDEX, TROPONINI,  in the last 168 hours  BNP (last 3 results) No results found for this basename: PROBNP,  in the last 8760 hours CBG: No results found for this basename: GLUCAP,  in the last 168 hours  Radiological Exams on Admission: Dg Chest Portable 1 View  12/25/2012   *RADIOLOGY REPORT*  Clinical Data: Chest pain and shortness of breath.  PORTABLE CHEST - 1 VIEW  Comparison: 02/11/2012  Findings: Stable emphysematous lung disease.  No infiltrate, edema, pneumothorax or pleural fluid is identified.  Heart size and mediastinal contours are within normal limits.  IMPRESSION: Stable emphysema.  No acute findings.   Original Report Authenticated By: Irish Lack, M.D.    EKG: Independently reviewed. Normal sinus rhythm.  Assessment/Plan Principal Problem:   Unstable angina Active Problems:   Hypothyroidism   Tobacco abuse   1. Persistent chest pain concerning for unstable angina - patient will be placed on nitroglycerin infusion and heparin infusion(once CT head is negative for bleed given her history of aneurysm in the brain and dizziness today). Cycle cardiac markers. Aspirin. Patient will be kept n.p.o. in anticipation of possible cardiac catheter. I have consulted on call cardiologist Dr. Tresa Endo. 2. Hypothyroidism - continue Synthroid. Check TSH. 3. Tobacco abuse -  Tobacco cessation counseling requested. 4. History of brain aneurysm - followed by Dr. Anne Hahn of neurology. Check CT head given that patient had some dizziness today and patient is going to be started heparin infusion.    Code Status: Full code.  Family Communication: Patient's husband and son and daughter at the bedside.  Disposition Plan: Admit to inpatient.    Kajuan Guyton N. [facility] Pager [phone].  If 7PM-7AM, please contact night-coverage [email or web address] Password [facility] 12/25/2012, 11:09 PM

## 2012-12-25 NOTE — Consult Note (Signed)
Reason for Consult: Chest Pain Referring Physician: Dr. Armandina Stammer is an 52 y.o. female.  HPI: Stacy Fernandez is a 52 yo woman with PMH of hypothyroidism and tobacco abuse who has been having intermittent chest pain for the last 1.5 months. She recently had a CPET on 12/13/12 when dr. Tresa Endo came in because she has CP and told her she might need a LHC. Today, she presents she started having chest pain radiating to her left arm and back with associated lightheadedness and diaphoresis beginning around 17:xx.   In the ER, her initial troponin and ECG were unremarkable and she was admitted to the hospitalist service; however, she continued to have some pain and when a nitroglycerin gtt was initiated, cardiology was consulted. She has had mention of questionable 2mm aneurysm vs. Other structure so pending CT head ordered by primary team prior to initiation of heparin. CTH unremarkable and heparin started. Her pain was easing off when I was evaluating her with NTG gtt up to 20 mcg. The pain is pressure-like, substernal with some radiation to left arm and upper shoulder/back as described above. She has had 6 days without cigarettes but did some today. She is requesting 14 mg nicotine patch.    Past Medical History  Diagnosis Date  . Hypothyroidism     Past Surgical History  Procedure Laterality Date  . Cholecystectomy    . Abdominal hysterectomy    . Appendectomy      Family History  Problem Relation Age of Onset  . CAD Mother   . Colon cancer Sister   . Lung cancer Sister   . Lung cancer Brother     Social History:  reports that she has been smoking.  She does not have any smokeless tobacco history on file. She reports that she does not drink alcohol or use illicit drugs.  Allergies:  Allergies  Allergen Reactions  . Contrast Media (Iodinated Diagnostic Agents)     unknown    Medications:  I have reviewed the patient's current medications.  Prior to Admission:  Prescriptions prior  to admission  Medication Sig Dispense Refill  . gabapentin (NEURONTIN) 100 MG capsule Take 300 mg by mouth at bedtime.      Marland Kitchen HYDROcodone-acetaminophen (NORCO/VICODIN) 5-325 MG per tablet Take 1-2 tablets by mouth every 6 (six) hours as needed for pain.      Marland Kitchen levothyroxine (SYNTHROID, LEVOTHROID) 75 MCG tablet Take 75 mcg by mouth daily before breakfast.      . nitroGLYCERIN (NITROSTAT) 0.4 MG SL tablet Place 0.4 mg under the tongue every 5 (five) minutes as needed for chest pain.      . pantoprazole (PROTONIX) 40 MG tablet Take 40 mg by mouth 2 (two) times daily.      . propranolol (INDERAL) 40 MG tablet Take 1 tablet (40 mg total) by mouth 2 (two) times daily.  60 tablet  1  . venlafaxine XR (EFFEXOR-XR) 150 MG 24 hr capsule Take 150 mg by mouth daily.       Scheduled: . aspirin  324 mg Oral Once  . aspirin EC  325 mg Oral Daily  . gabapentin  300 mg Oral QHS  . levothyroxine  75 mcg Oral QAC breakfast  . nicotine  14 mg Transdermal Daily  . pantoprazole  40 mg Oral BID  . propranolol  40 mg Oral BID  . sodium chloride  3 mL Intravenous Q12H  . venlafaxine XR  150 mg Oral Daily  Results for orders placed during the hospital encounter of 12/25/12 (from the past 48 hour(s))  CBC     Status: Abnormal   Collection Time    12/25/12  7:56 PM      Result Value Range   WBC 8.5  4.0 - 10.5 K/uL   RBC 3.78 (*) 3.87 - 5.11 MIL/uL   Hemoglobin 12.2  12.0 - 15.0 g/dL   HCT 29.5 (*) 62.1 - 30.8 %   MCV 90.2  78.0 - 100.0 fL   MCH 32.3  26.0 - 34.0 pg   MCHC 35.8  30.0 - 36.0 g/dL   RDW 65.7  84.6 - 96.2 %   Platelets 179  150 - 400 K/uL  COMPREHENSIVE METABOLIC PANEL     Status: Abnormal   Collection Time    12/25/12  8:17 PM      Result Value Range   Sodium 140  135 - 145 mEq/L   Potassium 3.7  3.5 - 5.1 mEq/L   Chloride 108  96 - 112 mEq/L   CO2 25  19 - 32 mEq/L   Glucose, Bld 96  70 - 99 mg/dL   BUN 18  6 - 23 mg/dL   Creatinine, Ser 9.52  0.50 - 1.10 mg/dL   Calcium 9.2   8.4 - 84.1 mg/dL   Total Protein 6.6  6.0 - 8.3 g/dL   Albumin 3.7  3.5 - 5.2 g/dL   AST 19  0 - 37 U/L   ALT 15  0 - 35 U/L   Alkaline Phosphatase 103  39 - 117 U/L   Total Bilirubin 0.2 (*) 0.3 - 1.2 mg/dL   GFR calc non Af Amer 79 (*) >90 mL/min   GFR calc Af Amer >90  >90 mL/min   Comment:            The eGFR has been calculated     using the CKD EPI equation.     This calculation has not been     validated in all clinical     situations.     eGFR's persistently     <90 mL/min signify     possible Chronic Kidney Disease.  PROTIME-INR     Status: None   Collection Time    12/25/12  8:17 PM      Result Value Range   Prothrombin Time 13.1  11.6 - 15.2 seconds   INR 1.00  0.00 - 1.49  APTT     Status: None   Collection Time    12/25/12  8:17 PM      Result Value Range   aPTT 30  24 - 37 seconds  D-DIMER, QUANTITATIVE     Status: None   Collection Time    12/25/12  8:17 PM      Result Value Range   D-Dimer, Quant <0.27  0.00 - 0.48 ug/mL-FEU   Comment:            AT THE INHOUSE ESTABLISHED CUTOFF     VALUE OF 0.48 ug/mL FEU,     THIS ASSAY HAS BEEN DOCUMENTED     IN THE LITERATURE TO HAVE     A SENSITIVITY AND NEGATIVE     PREDICTIVE VALUE OF AT LEAST     98 TO 99%.  THE TEST RESULT     SHOULD BE CORRELATED WITH     AN ASSESSMENT OF THE CLINICAL     PROBABILITY OF DVT / VTE.  POCT I-STAT TROPONIN I  Status: None   Collection Time    12/25/12  8:29 PM      Result Value Range   Troponin i, poc 0.01  0.00 - 0.08 ng/mL   Comment 3            Comment: Due to the release kinetics of cTnI,     a negative result within the first hours     of the onset of symptoms does not rule out     myocardial infarction with certainty.     If myocardial infarction is still suspected,     repeat the test at appropriate intervals.  POCT I-STAT, CHEM 8     Status: Abnormal   Collection Time    12/25/12  8:30 PM      Result Value Range   Sodium 142  135 - 145 mEq/L   Potassium  3.8  3.5 - 5.1 mEq/L   Chloride 108  96 - 112 mEq/L   BUN 18  6 - 23 mg/dL   Creatinine, Ser 4.09  0.50 - 1.10 mg/dL   Glucose, Bld 93  70 - 99 mg/dL   Calcium, Ion 8.11  1.12 - 1.23 mmol/L   TCO2 24  0 - 100 mmol/L   Hemoglobin 11.2 (*) 12.0 - 15.0 g/dL   HCT 91.4 (*) 78.2 - 95.6 %  URINALYSIS, ROUTINE W REFLEX MICROSCOPIC     Status: Abnormal   Collection Time    12/25/12 10:25 PM      Result Value Range   Color, Urine YELLOW  YELLOW   APPearance CLOUDY (*) CLEAR   Specific Gravity, Urine 1.027  1.005 - 1.030   pH 5.0  5.0 - 8.0   Glucose, UA NEGATIVE  NEGATIVE mg/dL   Hgb urine dipstick LARGE (*) NEGATIVE   Bilirubin Urine NEGATIVE  NEGATIVE   Ketones, ur NEGATIVE  NEGATIVE mg/dL   Protein, ur NEGATIVE  NEGATIVE mg/dL   Urobilinogen, UA 0.2  0.0 - 1.0 mg/dL   Nitrite NEGATIVE  NEGATIVE   Leukocytes, UA TRACE (*) NEGATIVE  URINE MICROSCOPIC-ADD ON     Status: Abnormal   Collection Time    12/25/12 10:25 PM      Result Value Range   Squamous Epithelial / LPF FEW (*) RARE   WBC, UA 7-10  <3 WBC/hpf   RBC / HPF 11-20  <3 RBC/hpf   Bacteria, UA RARE  RARE   Casts HYALINE CASTS (*) NEGATIVE   Urine-Other MUCOUS PRESENT    TROPONIN I     Status: None   Collection Time    12/25/12 10:57 PM      Result Value Range   Troponin I <0.30  <0.30 ng/mL   Comment:            Due to the release kinetics of cTnI,     a negative result within the first hours     of the onset of symptoms does not rule out     myocardial infarction with certainty.     If myocardial infarction is still suspected,     repeat the test at appropriate intervals.  POCT I-STAT TROPONIN I     Status: None   Collection Time    12/25/12 11:02 PM      Result Value Range   Troponin i, poc 0.01  0.00 - 0.08 ng/mL   Comment 3            Comment: Due to the release kinetics of cTnI,  a negative result within the first hours     of the onset of symptoms does not rule out     myocardial infarction with  certainty.     If myocardial infarction is still suspected,     repeat the test at appropriate intervals.    Ct Head Wo Contrast  12/25/2012   *RADIOLOGY REPORT*  Clinical Data: Dizziness and left frontal headache.  CT HEAD WITHOUT CONTRAST  Technique:  Contiguous axial images were obtained from the base of the skull through the vertex without contrast.  Comparison: 07/28/2009  Findings: The brain demonstrates no evidence of hemorrhage, infarction, edema, mass effect, extra-axial fluid collection, hydrocephalus or mass lesion.  The skull is unremarkable.  IMPRESSION: Normal head CT.   Original Report Authenticated By: Irish Lack, M.D.   Dg Chest Portable 1 View  12/25/2012   *RADIOLOGY REPORT*  Clinical Data: Chest pain and shortness of breath.  PORTABLE CHEST - 1 VIEW  Comparison: 02/11/2012  Findings: Stable emphysematous lung disease.  No infiltrate, edema, pneumothorax or pleural fluid is identified.  Heart size and mediastinal contours are within normal limits.  IMPRESSION: Stable emphysema.  No acute findings.   Original Report Authenticated By: Irish Lack, M.D.    Review of Systems  Constitutional: Negative for fever and chills.  HENT: Negative for hearing loss and tinnitus.   Eyes: Negative for double vision, photophobia and pain.  Respiratory: Positive for shortness of breath. Negative for cough and hemoptysis.   Cardiovascular: Positive for chest pain. Negative for orthopnea, claudication and leg swelling.  Gastrointestinal: Negative for heartburn, nausea and vomiting.  Genitourinary: Negative for dysuria and frequency.  Musculoskeletal: Negative for myalgias and falls.  Skin: Negative for itching and rash.  Neurological: Negative for tingling, tremors and sensory change.  Endo/Heme/Allergies: Negative for environmental allergies and polydipsia. Does not bruise/bleed easily.  Psychiatric/Behavioral: Negative for suicidal ideas, hallucinations and substance abuse.   Blood  pressure 110/66, pulse 68, temperature 97.6 F (36.4 C), temperature source Oral, resp. rate 18, height 5' 6.5" (1.689 m), weight 58.968 kg (130 lb), SpO2 100.00%. Physical Exam  Nursing note and vitals reviewed. Constitutional: She is oriented to person, place, and time. She appears well-developed and well-nourished. No distress.  HENT:  Head: Normocephalic and atraumatic.  Nose: Nose normal.  Mouth/Throat: No oropharyngeal exudate.  Eyes: Conjunctivae and EOM are normal. Pupils are equal, round, and reactive to light. No scleral icterus.  Neck: Normal range of motion. Neck supple. No tracheal deviation present. No thyromegaly present.  JVP 1-2 cm above clavicle at 45 degrees  Cardiovascular: Normal rate, regular rhythm and normal heart sounds.  Exam reveals no gallop.   No murmur heard. Respiratory: Effort normal. No respiratory distress. She has no wheezes. She has rales.  Scattered occasional rales at the bases  GI: Soft. Bowel sounds are normal. She exhibits no distension. There is no tenderness. There is no rebound.  Musculoskeletal: Normal range of motion. She exhibits no edema and no tenderness.  Neurological: She is alert and oriented to person, place, and time. She has normal reflexes. No cranial nerve deficit.  Skin: Skin is warm and dry. No rash noted. She is not diaphoretic. No erythema.  Psychiatric: She has a normal mood and affect. Her behavior is normal.    Labs reviewed; wbc 8.5, h/h 11.2/32, plt 179, na 142, K 3.8, bun/cr 18/0.9, troponin < 0.3, urinalysis with 7-10 wbc, 11-20 rbc, 1.027 sp gravity, rare bacteria Chest x-ray: no acute process ECG: NSR  Problem List Chest Pain Tobacco use Anemia Hypothyroidism  Assessment/Plan: 52 yo woman with hypothyroidism, intermittent CP over the last 1.5 months and ongoing tobacco use now being treated for unstable angina by the hospitalist service. Differential diagnosis continues to be UA/ACS, atypical chest pain,  musculoskeletal chest pain, GERD, esophageal spasm among other etiologies. I favor a diagnosis of unstable angina given pain responsive to NTG, tobacco use. Trend troponins overnight, on telemetry, heparin/nitroglycerin gtt, large aspirin received.  - aspirin/heparin gtt - trend troponins - smoking cessation counseling; nicotine patch ordered by me - NPO for potential LHC - NTG gtt for pain - atorva 80 mg qHS - tsh, bnp, hba1c, lipid panel - on beta-blocker - could switch to metoprolol (on propranolol for headaches) - consider iron studies if not evaluated in recent past  Elisea Khader 12/26/2012, 1:00 AM

## 2012-12-25 NOTE — ED Notes (Signed)
If CT shows no hemophage. Start Heparin.

## 2012-12-25 NOTE — Progress Notes (Signed)
ANTICOAGULATION CONSULT NOTE - Initial Consult  Pharmacy Consult for heparin Indication: chest pain/ACS  Allergies  Allergen Reactions  . Contrast Media (Iodinated Diagnostic Agents)     unknown    Patient Measurements: Height: 5' 6.5" (168.9 cm) Weight: 130 lb (58.968 kg) IBW/kg (Calculated) : 60.45  Vital Signs: Temp: 97.6 F (36.4 C) (06/08 1952) Temp src: Oral (06/08 1952) BP: 118/77 mmHg (06/08 2215) Pulse Rate: 69 (06/08 2215)  Labs:  Recent Labs  12/25/12 1956 12/25/12 2017 12/25/12 2030  HGB 12.2  --  11.2*  HCT 34.1*  --  33.0*  PLT 179  --   --   APTT  --  30  --   LABPROT  --  13.1  --   INR  --  1.00  --   CREATININE  --  0.84 0.90    Estimated Creatinine Clearance: 68.9 ml/min (by C-G formula based on Cr of 0.9).   Medical History: Past Medical History  Diagnosis Date  . Hypothyroidism     Assessment: 52yo female c/o sudden onset of CP at rest, had a stress test 2wk ago with plan to f/u w/ cards this week, D-dimer negative, to begin heparin for possible ACS.  Goal of Therapy:  Heparin level 0.3-0.7 units/ml Monitor platelets by anticoagulation protocol: Yes   Plan:  Will give heparin 3000 units IV bolus x1 followed by gtt at 750 units/hr and monitor heparin levels and CBC.  Vernard Gambles, PharmD, BCPS  12/25/2012,10:59 PM

## 2012-12-25 NOTE — ED Notes (Signed)
1 month ago she had a a adenosine stress test which showed she probably had a blockage. She is for Cath lab on Tues. With a Dr. Tresa Endo.

## 2012-12-25 NOTE — ED Notes (Signed)
Pt. Was in car that had [place] over in a church parking lot d/t Pt. C/O centralize chest pain radiating to her left arm with SOB, and lightheadedness.

## 2012-12-25 NOTE — ED Notes (Signed)
EMS gave her 324mg  of Asprin and held nitro due to B'P of 104-118 Sysytolic.

## 2012-12-26 ENCOUNTER — Encounter (HOSPITAL_COMMUNITY)
Admission: EM | Disposition: A | Discharge status: Home / Self Care | Payer: Self-pay | Source: Home / Self Care | Attending: Cardiovascular Disease

## 2012-12-26 ENCOUNTER — Encounter (HOSPITAL_COMMUNITY): Payer: Self-pay | Admitting: *Deleted

## 2012-12-26 DIAGNOSIS — R079 Chest pain, unspecified: Secondary | ICD-10-CM

## 2012-12-26 DIAGNOSIS — I2510 Atherosclerotic heart disease of native coronary artery without angina pectoris: Secondary | ICD-10-CM

## 2012-12-26 HISTORY — PX: LEFT HEART CATHETERIZATION WITH CORONARY ANGIOGRAM: SHX5451

## 2012-12-26 LAB — COMPREHENSIVE METABOLIC PANEL
ALT: 26 U/L (ref 0–35)
Alkaline Phosphatase: 83 U/L (ref 39–117)
CO2: 24 mEq/L (ref 19–32)
Calcium: 8.8 mg/dL (ref 8.4–10.5)
GFR calc Af Amer: >90 mL/min (ref >90)
GFR calc non Af Amer: >90 mL/min (ref >90)
Glucose, Bld: 89 mg/dL (ref 70–99)
Sodium: 140 mEq/L (ref 135–145)

## 2012-12-26 LAB — MRSA PCR SCREENING: MRSA by PCR: NEGATIVE

## 2012-12-26 LAB — CBC WITH DIFFERENTIAL/PLATELET
Basophils Absolute: 0 10*3/uL (ref 0.0–0.1)
Basophils Relative: 0 % (ref 0–1)
Hemoglobin: 10.6 g/dL — ABNORMAL LOW (ref 12.0–15.0)
MCHC: 35.5 g/dL (ref 30.0–36.0)
Neutro Abs: 2.6 10*3/uL (ref 1.7–7.7)
Neutrophils Relative %: 52 % (ref 43–77)
RDW: 12.3 % (ref 11.5–15.5)

## 2012-12-26 LAB — LIPID PANEL
Cholesterol: 149 mg/dL (ref 0–200)
HDL: 34 mg/dL — ABNORMAL LOW (ref >39)
Triglycerides: 86 mg/dL (ref <150)

## 2012-12-26 LAB — PRO B NATRIURETIC PEPTIDE: Pro B Natriuretic peptide (BNP): 326.1 pg/mL — ABNORMAL HIGH (ref 0–125)

## 2012-12-26 LAB — HEMOGLOBIN A1C
Hgb A1c MFr Bld: 5 % (ref <5.7)
Mean Plasma Glucose: 97 mg/dL (ref <117)

## 2012-12-26 SURGERY — LEFT HEART CATHETERIZATION WITH CORONARY ANGIOGRAM
Anesthesia: LOCAL

## 2012-12-26 MED ORDER — PROPRANOLOL HCL 40 MG PO TABS
40.0000 mg | ORAL_TABLET | Freq: Two times a day (BID) | ORAL | Status: DC
  Administered 2012-12-26 – 2012-12-27 (×2): 40 mg via ORAL
  Filled 2012-12-26 (×6): qty 1

## 2012-12-26 MED ORDER — PANTOPRAZOLE SODIUM 40 MG PO TBEC
40.0000 mg | DELAYED_RELEASE_TABLET | Freq: Two times a day (BID) | ORAL | Status: DC
  Administered 2012-12-26 – 2012-12-27 (×3): 40 mg via ORAL
  Filled 2012-12-26 (×3): qty 1

## 2012-12-26 MED ORDER — VENLAFAXINE HCL ER 150 MG PO CP24
150.0000 mg | ORAL_CAPSULE | Freq: Every day | ORAL | Status: DC
Start: 2012-12-26 — End: 2012-12-27
  Administered 2012-12-26 – 2012-12-27 (×2): 150 mg via ORAL
  Filled 2012-12-26 (×2): qty 1

## 2012-12-26 MED ORDER — SODIUM CHLORIDE 0.9 % IV SOLN: 250.0000 mL | INTRAVENOUS | Status: DC | PRN

## 2012-12-26 MED ORDER — SODIUM CHLORIDE 0.9 % IJ SOLN: 3.0000 mL | Freq: Two times a day (BID) | INTRAMUSCULAR | Status: DC

## 2012-12-26 MED ORDER — FAMOTIDINE IN NACL 20-0.9 MG/50ML-% IV SOLN
20.0000 mg | INTRAVENOUS | Status: AC
  Administered 2012-12-26: 20 mg via INTRAVENOUS
  Filled 2012-12-26: qty 50

## 2012-12-26 MED ORDER — ATORVASTATIN CALCIUM 80 MG PO TABS
80.0000 mg | ORAL_TABLET | Freq: Every day | ORAL | Status: DC
  Administered 2012-12-26: 22:00:00 80 mg via ORAL
  Filled 2012-12-26 (×4): qty 1

## 2012-12-26 MED ORDER — SODIUM CHLORIDE 0.9 % IV SOLN
INTRAVENOUS | Status: AC
  Administered 2012-12-26: 14:00:00 via INTRAVENOUS

## 2012-12-26 MED ORDER — SODIUM CHLORIDE 0.9 % IV SOLN
INTRAVENOUS | Status: DC
  Administered 2012-12-26: via INTRAVENOUS

## 2012-12-26 MED ORDER — NICOTINE 14 MG/24HR TD PT24
14.0000 mg | MEDICATED_PATCH | Freq: Every day | TRANSDERMAL | Status: DC
  Administered 2012-12-27: 14 mg via TRANSDERMAL
  Filled 2012-12-26 (×3): qty 1

## 2012-12-26 MED ORDER — ASPIRIN 81 MG PO CHEW: 324.0000 mg | CHEWABLE_TABLET | ORAL | Status: DC

## 2012-12-26 MED ORDER — SODIUM CHLORIDE 0.9 % IJ SOLN: 3.0000 mL | INTRAMUSCULAR | Status: DC | PRN

## 2012-12-26 MED ORDER — ALPRAZOLAM 0.25 MG PO TABS
0.2500 mg | ORAL_TABLET | Freq: Once | ORAL | Status: AC
  Administered 2012-12-26: 0.25 mg via ORAL
  Filled 2012-12-26: qty 1

## 2012-12-26 MED ORDER — ONDANSETRON HCL 4 MG PO TABS: 4.0000 mg | ORAL_TABLET | Freq: Four times a day (QID) | ORAL | Status: DC | PRN

## 2012-12-26 MED ORDER — MORPHINE SULFATE 2 MG/ML IJ SOLN
2.0000 mg | INTRAMUSCULAR | Status: DC | PRN
  Administered 2012-12-26: 2 mg via INTRAVENOUS
  Filled 2012-12-26: qty 1

## 2012-12-26 MED ORDER — LEVOTHYROXINE SODIUM 75 MCG PO TABS
75.0000 ug | ORAL_TABLET | Freq: Every day | ORAL | Status: DC
  Administered 2012-12-26 – 2012-12-27 (×2): 75 ug via ORAL
  Filled 2012-12-26 (×4): qty 1

## 2012-12-26 MED ORDER — HYDROCODONE-ACETAMINOPHEN 5-325 MG PO TABS
1.0000 | ORAL_TABLET | Freq: Four times a day (QID) | ORAL | Status: DC | PRN
  Administered 2012-12-26: 2 via ORAL
  Filled 2012-12-26: qty 2

## 2012-12-26 MED ORDER — VERAPAMIL HCL 2.5 MG/ML IV SOLN
INTRAVENOUS | Status: AC
  Filled 2012-12-26: qty 2

## 2012-12-26 MED ORDER — MIDAZOLAM HCL 2 MG/2ML IJ SOLN
INTRAMUSCULAR | Status: AC
  Filled 2012-12-26: qty 2

## 2012-12-26 MED ORDER — ASPIRIN EC 325 MG PO TBEC
325.0000 mg | DELAYED_RELEASE_TABLET | Freq: Every day | ORAL | Status: DC
  Filled 2012-12-26: qty 1

## 2012-12-26 MED ORDER — MORPHINE SULFATE 2 MG/ML IJ SOLN: 1.0000 mg | INTRAMUSCULAR | Status: DC | PRN

## 2012-12-26 MED ORDER — LIDOCAINE HCL (PF) 1 % IJ SOLN
INTRAMUSCULAR | Status: AC
  Filled 2012-12-26: qty 30

## 2012-12-26 MED ORDER — HEPARIN SODIUM (PORCINE) 1000 UNIT/ML IJ SOLN
INTRAMUSCULAR | Status: AC
  Filled 2012-12-26: qty 1

## 2012-12-26 MED ORDER — ACETAMINOPHEN 325 MG PO TABS: 650.0000 mg | ORAL_TABLET | ORAL | Status: DC | PRN

## 2012-12-26 MED ORDER — GABAPENTIN 300 MG PO CAPS
300.0000 mg | ORAL_CAPSULE | Freq: Every day | ORAL | Status: DC
  Administered 2012-12-26: 300 mg via ORAL
  Filled 2012-12-26 (×4): qty 1

## 2012-12-26 MED ORDER — METHYLPREDNISOLONE SODIUM SUCC 125 MG IJ SOLR
125.0000 mg | INTRAMUSCULAR | Status: AC
  Administered 2012-12-26: 125 mg via INTRAVENOUS
  Filled 2012-12-26: qty 2

## 2012-12-26 MED ORDER — ASPIRIN EC 325 MG PO TBEC
325.0000 mg | DELAYED_RELEASE_TABLET | Freq: Every day | ORAL | Status: DC
  Administered 2012-12-27: 10:00:00 325 mg via ORAL
  Filled 2012-12-26: qty 1

## 2012-12-26 MED ORDER — ACETAMINOPHEN 325 MG PO TABS: 650.0000 mg | ORAL_TABLET | Freq: Four times a day (QID) | ORAL | Status: DC | PRN

## 2012-12-26 MED ORDER — ACETAMINOPHEN 650 MG RE SUPP: 650.0000 mg | Freq: Four times a day (QID) | RECTAL | Status: DC | PRN

## 2012-12-26 MED ORDER — HEPARIN (PORCINE) IN NACL 2-0.9 UNIT/ML-% IJ SOLN
INTRAMUSCULAR | Status: AC
  Filled 2012-12-26: qty 1000

## 2012-12-26 MED ORDER — ASPIRIN 81 MG PO CHEW
324.0000 mg | CHEWABLE_TABLET | ORAL | Status: AC
  Administered 2012-12-26: 324 mg via ORAL
  Filled 2012-12-26: qty 4

## 2012-12-26 MED ORDER — ONDANSETRON HCL 4 MG/2ML IJ SOLN: 4.0000 mg | Freq: Four times a day (QID) | INTRAMUSCULAR | Status: DC | PRN

## 2012-12-26 MED ORDER — DIPHENHYDRAMINE HCL 50 MG/ML IJ SOLN
25.0000 mg | INTRAMUSCULAR | Status: AC
  Administered 2012-12-26: 25 mg via INTRAVENOUS
  Filled 2012-12-26: qty 1

## 2012-12-26 NOTE — Progress Notes (Signed)
ANTICOAGULATION CONSULT NOTE - Follow Up Consult  Pharmacy Consult for heparin Indication: chest pain/ACS  Labs:  Recent Labs  12/25/12 1956 12/25/12 2017 12/25/12 2030 12/25/12 2257 12/26/12 0119 12/26/12 0340 12/26/12 0553  HGB 12.2  --  11.2*  --   --  10.6*  --   HCT 34.1*  --  33.0*  --   --  29.9*  --   PLT 179  --   --   --   --  156  --   APTT  --  30  --   --   --   --   --   LABPROT  --  13.1  --   --   --   --   --   INR  --  1.00  --   --   --   --   --   CREATININE  --  0.84 0.90  --   --  0.77  --   TROPONINI  --   --   --  <0.30 <0.30  --  <0.30    Assessment: 52yo female slightly subtherapeutic on heparin with initial dosing for CP.  Goal of Therapy:  Heparin level 0.3-0.7 units/ml   Plan:  Will increase heparin gtt slightly to 800 units/hr and check level in 6hr.  Vernard Gambles, PharmD, BCPS  12/26/2012,6:47 AM

## 2012-12-26 NOTE — CV Procedure (Signed)
Stacy Fernandez is a 52 y.o. female    [id number] LOCATION:  FACILITY: [facility]  PHYSICIAN: Nanetta Batty, M.D. [birth year]   DATE OF PROCEDURE:  12/26/2012  DATE OF DISCHARGE:   CARDIAC CATHETERIZATION     History obtained from chart review.[name] is a 52 yo woman with PMH of hypothyroidism and tobacco abuse who has been having intermittent chest pain for the last 1.5 months. She recently had a CPET on 12/13/12 when dr. Tresa Endo came in because she has CP and told her she might need a LHC. Today, she presents she started having chest pain radiating to her left arm and back with associated lightheadedness and diaphoresis beginning around 17:xx.  In the ER, her initial troponin and ECG were unremarkable and she was admitted to the hospitalist service; however, she continued to have some pain and when a nitroglycerin gtt was initiated, cardiology was consulted. She has had mention of questionable 2mm aneurysm vs. Other structure so pending CT head ordered by primary team prior to initiation of heparin. CTH unremarkable and heparin started. Her pain was easing off when I was evaluating her with NTG gtt up to 20 mcg. The pain is pressure-like, substernal with some radiation to left arm and upper shoulder/back as described above.      PROCEDURE DESCRIPTION:    The patient was brought to the second floor [facility] cath lab in the postabsorptive state. She was premedicated with Valium 5 mg by mouth, and IV Versed. She also received 25 mg of Benadryl, 20 mg of Pepcid and 125 mm milligrams of Solu-Medrol for contrast allergy prophylaxis. Her right wrist was prepped and shaved in usual sterile fashion. Xylocaine 1% was used for local anesthesia. A 5 French sheath was inserted into the right radial  artery using standard Seldinger technique. The patient received 3000 units  of heparin  intravenously.  A 5 [place] TIG catheter as well as a 5 French pigtail catheter were used for selective coronary  angiography and left ventriculography respectively. Visipaque dye was used for the entirety of the case. Retrograde aorta, left ventricular and pulmonary pressures were recorded.    HEMODYNAMICS:    AO SYSTOLIC/AO DIASTOLIC: 98/60   LV SYSTOLIC/LV DIASTOLIC: 100/9  ANGIOGRAPHIC RESULTS:   1. Left main; normal  2. LAD; normal, there was a 40-50% ostial first diagonal branch stenosis 3. Left circumflex; normal.  4. Right coronary artery; dominant and normal 5. Left ventriculography; RAO left ventriculogram was performed using  25 mL of Visipaque dye at 12 mL/second. The overall LVEF estimated  60 %  With wall motion abnormalities  IMPRESSION:[name] has essentially normal coronary arteries and normal left ventricular function. I believe her chest pain is noncardiac. The sheath was removed and a TR band  was placed on the right wrist to achieve patent hemostasis. The patient left the Cath Lab in stable condition. The the plan will be removed in 2 hours, the patient will be gently hydrated and sent home. She'll see mid-level provider in one to 2 weeks.  Runell Gess MD, [facility] 12/26/2012 2:10 PM

## 2012-12-26 NOTE — Progress Notes (Signed)
TR BAND REMOVAL  LOCATION:  right radial  DEFLATED PER PROTOCOL:  yes  TIME BAND OFF / DRESSING APPLIED:   1800   SITE UPON ARRIVAL:   Level 1  SITE AFTER BAND REMOVAL:  Level 1  REVERSE ALLEN'S TEST:    positive  CIRCULATION SENSATION AND MOVEMENT:  Within Normal Limits  yes  COMMENTS:  bruise

## 2012-12-26 NOTE — ED Notes (Signed)
Spoke with Admit MD RE: results of Head CT. Ok to start heparin orders at this time.

## 2012-12-26 NOTE — Progress Notes (Signed)
[facility]  Subjective: No further chest pain.   Objective: Vital signs in last 24 hours: Temp:  [97.6 F (36.4 C)-98.1 F (36.7 C)] 98.1 F (36.7 C) (06/09 0100) Pulse Rate:  [65-97] 68 (06/09 0700) Resp:  [11-28] 15 (06/09 0700) BP: (82-135)/(55-81) 82/56 mmHg (06/09 0700) SpO2:  [99 %-100 %] 100 % (06/09 0700) FiO2 (%):  [28 %] 28 % (06/08 2017) Weight:  [130 lb (58.968 kg)-141 lb 15.6 oz (64.4 kg)] 141 lb 15.6 oz (64.4 kg) (06/09 0100)    Intake/Output from previous day: 06/08 0701 - 06/09 0700 In: 191.8 [I.V.:191.8] Out: -  Intake/Output this shift:    Medications Current Facility-Administered Medications  Medication Dose Route Frequency Provider Last Rate Last Dose  . 0.9 %  sodium chloride infusion   Intravenous Continuous Eduard Clos, MD 20 mL/hr at 12/26/12 0028    . acetaminophen (TYLENOL) tablet 650 mg  650 mg Oral Q6H PRN Eduard Clos, MD       Or  . acetaminophen (TYLENOL) suppository 650 mg  650 mg Rectal Q6H PRN Eduard Clos, MD      . aspirin chewable tablet 324 mg  324 mg Oral Once Celene Kras, MD      . aspirin EC tablet 325 mg  325 mg Oral Daily Eduard Clos, MD      . atorvastatin (LIPITOR) tablet 80 mg  80 mg Oral q1800 Leeann Must, MD      . gabapentin (NEURONTIN) capsule 300 mg  300 mg Oral QHS Eduard Clos, MD      . heparin ADULT infusion 100 units/mL (25000 units/250 mL)  800 Units/hr Intravenous Continuous Colleen Can, [facility] 8 mL/hr at 12/26/12 0644 800 Units/hr at 12/26/12 0644  . HYDROcodone-acetaminophen (NORCO/VICODIN) 5-325 MG per tablet 1-2 tablet  1-2 tablet Oral Q6H PRN Eduard Clos, MD      . levothyroxine (SYNTHROID, LEVOTHROID) tablet 75 mcg  75 mcg Oral QAC breakfast Eduard Clos, MD      . morphine 2 MG/ML injection 2 mg  2 mg Intravenous Q3H PRN Leeann Must, MD   2 mg at 12/26/12 0200  . nicotine (NICODERM CQ - dosed in mg/24 hours) patch 14 mg   14 mg Transdermal Daily Leeann Must, MD      . nitroGLYCERIN 0.2 mg/mL in dextrose 5 % infusion  5 mcg/min Intravenous Titrated Celene Kras, MD 7.5 mL/hr at 12/26/12 0100 25 mcg/min at 12/26/12 0100  . ondansetron (ZOFRAN) tablet 4 mg  4 mg Oral Q6H PRN Eduard Clos, MD       Or  . ondansetron [facility]) injection 4 mg  4 mg Intravenous Q6H PRN Eduard Clos, MD      . pantoprazole (PROTONIX) EC tablet 40 mg  40 mg Oral BID Eduard Clos, MD      . propranolol (INDERAL) tablet 40 mg  40 mg Oral BID Eduard Clos, MD      . sodium chloride 0.9 % injection 3 mL  3 mL Intravenous Q12H Eduard Clos, MD      . venlafaxine XR (EFFEXOR-XR) 24 hr capsule 150 mg  150 mg Oral Daily Eduard Clos, MD        PE: General appearance: alert, cooperative and no distress Lungs: clear to auscultation bilaterally Heart: regular rate and rhythm, S1, S2 normal, no murmur, click, rub or gallop Extremities: no LEE Pulses: 2+ and symmetric Skin:  warm and dry Neurologic: Grossly normal  Lab Results:   Recent Labs  12/25/12 1956 12/25/12 2030 12/26/12 0340  WBC 8.5  --  5.1  HGB 12.2 11.2* 10.6*  HCT 34.1* 33.0* 29.9*  PLT 179  --  156   BMET  Recent Labs  12/25/12 2017 12/25/12 2030 12/26/12 0340  NA 140 142 140  K 3.7 3.8 4.0  CL 108 108 110  CO2 25  --  24  GLUCOSE 96 93 89  BUN 18 18 14   CREATININE 0.84 0.90 0.77  CALCIUM 9.2  --  8.8   PT/INR  Recent Labs  12/25/12 2017  LABPROT 13.1  INR 1.00   Cholesterol  Recent Labs  12/26/12 0340  CHOL 149   Cardiac Enzymes Cardiac Panel (last 3 results)  Recent Labs  12/25/12 2257 12/26/12 0119 12/26/12 0553  TROPONINI <0.30 <0.30 <0.30    Assessment/Plan  Principal Problem:   Unstable angina Active Problems:   Hypothyroidism   Tobacco abuse  Plan: Troponin's negative x 3. No current chest pain. Plan for diagnostic cath this afternoon to evaluate for unstable angina. Pt may have a  clear liquid diet for breakfast, followed by NPO. Continue on IV heparin and NTG. Renal function is stable. INR is WNL. Plan to do cath radially.  Pt states that she has had a severe reaction to contrast dye in the past that led to a CODE Blue. Will notify Dr. Allyson Sabal and cath lab. Will order contrast dye prophylaxis.     LOS: 1 day    Brittainy M. Delmer Islam 12/26/2012 8:02 AM  Agree with note written by Boyce Medici  [facility]  Admitted with CP worrisome for [place]. Enz neg. EKG w/o acute changes. Recent + MET test in our office. Exam benign. For cath today.   Runell Gess 12/26/2012 3:33 PM

## 2012-12-26 NOTE — Care Management Note (Signed)
    Page 1 of 1   12/26/2012     8:44:38 AM   CARE MANAGEMENT NOTE 12/26/2012  Patient:  Stacy Fernandez, Stacy Fernandez   Account Number:  [id number]  Date Initiated:  12/26/2012  Documentation initiated by:  Junius Creamer  Subjective/Objective Assessment:   adm w angina     Action/Plan:   lives w fam, pcp dr Babette Relic spear   Anticipated DC Date:     Anticipated DC Plan:        DC Planning Services  CM consult      Choice offered to / List presented to:             Status of service:   Medicare Important Message given?   (If response is "NO", the following Medicare IM given date fields will be blank) Date Medicare IM given:   Date Additional Medicare IM given:    Discharge Disposition:    Per UR Regulation:  Reviewed for med. necessity/level of care/duration of stay  If discussed at Long Length of Stay Meetings, dates discussed:    Comments:

## 2012-12-26 NOTE — Progress Notes (Signed)
ANTICOAGULATION CONSULT NOTE - Follow Up Consult  Pharmacy Consult for heparin Indication: chest pain/ACS  Labs:  Recent Labs  12/25/12 1956 12/25/12 2017 12/25/12 2030  12/26/12 0119 12/26/12 0340 12/26/12 0540 12/26/12 0553 12/26/12 1210  HGB 12.2  --  11.2*  --   --  10.6*  --   --   --   HCT 34.1*  --  33.0*  --   --  29.9*  --   --   --   PLT 179  --   --   --   --  156  --   --   --   APTT  --  30  --   --   --   --   --   --   --   LABPROT  --  13.1  --   --   --   --   --   --   --   INR  --  1.00  --   --   --   --   --   --   --   HEPARINUNFRC  --   --   --   --   --   --  0.29*  --  0.19*  CREATININE  --  0.84 0.90  --   --  0.77  --   --   --   TROPONINI  --   --   --   < > <0.30  --   --  <0.30 <0.30  < > = values in this interval not displayed.  . sodium chloride 20 mL/hr at 12/26/12 0028  . heparin 800 Units/hr (12/26/12 0644)  . [facility] HOLD] nitroGLYCERIN 25 mcg/min (12/26/12 0100)    Assessment: 52yo female slightly subtherapeutic on heparin at 800 units/hr.  Now in cath lab.  Goal of Therapy:  Heparin level 0.3-0.7 units/ml   Plan:  1. Will f/u plans for anticoagulation after cath lab.  Tad Moore, BCPS  Clinical Pharmacist Pager [phone]  12/26/2012 2:11 PM

## 2012-12-26 NOTE — ED Notes (Addendum)
Cardiology at bedside to see Pt. Wanted Nitro Gtt. Set at 17mcg/min.Stacy Fernandez Pt. States her C/P was 6 prior to adjustment.

## 2012-12-26 NOTE — Progress Notes (Signed)
[facility] TEAM 1 - Stepdown/ICU TEAM  Noted attending changed to Dr. Nanetta Batty w/ [facility].    Hx reviewed.  No active complicated medical issues outside of presenting complaint of chest pain.   Will sign off.  Please call if we can assist in the care of this patient in any way.  Lonia Blood, MD [facility] Office  [phone] Pager [phone]  On-Call/Text Page:      [email or web address]      password [facility]

## 2012-12-26 NOTE — H&P (Signed)
    Pt was reexamined and existing H & P reviewed. No changes found.  Runell Gess, MD [facility] 12/26/2012 1:41 PM

## 2012-12-27 ENCOUNTER — Ambulatory Visit: Payer: BC Managed Care – PPO | Admitting: Cardiovascular Disease

## 2012-12-27 LAB — CBC
Hemoglobin: 11.3 g/dL — ABNORMAL LOW (ref 12.0–15.0)
RBC: 3.57 MIL/uL — ABNORMAL LOW (ref 3.87–5.11)

## 2012-12-27 MED ORDER — ATORVASTATIN CALCIUM 40 MG PO TABS: 40.0000 mg | ORAL_TABLET | Freq: Every day | ORAL | Status: DC

## 2012-12-27 MED ORDER — ASPIRIN EC 81 MG PO TBEC: 81.0000 mg | DELAYED_RELEASE_TABLET | Freq: Every day | ORAL | Status: DC

## 2012-12-27 MED ORDER — ASPIRIN 325 MG PO TBEC: 81.0000 mg | DELAYED_RELEASE_TABLET | Freq: Every day | ORAL | Status: DC

## 2012-12-27 NOTE — Progress Notes (Signed)
Subjective: No Complaints  Objective: Vital signs in last 24 hours: Temp:  [97.7 F (36.5 C)-98.1 F (36.7 C)] 97.7 F (36.5 C) (06/10 0813) Pulse Rate:  [68-85] 78 (06/10 0813) Resp:  [12-20] 18 (06/10 0813) BP: (82-123)/(52-69) 123/60 mmHg (06/10 0813) SpO2:  [93 %-100 %] 100 % (06/10 0813) Weight:  [143 lb 4.8 oz (65 kg)] 143 lb 4.8 oz (65 kg) (06/10 0020) Last BM Date: 03/27/13  Intake/Output from previous day: 06/09 0701 - 06/10 0700 In: 2108.8 [P.O.:1680; I.V.:428.8] Out: 3700 [Urine:3700] Intake/Output this shift:    Medications Current Facility-Administered Medications  Medication Dose Route Frequency Provider Last Rate Last Dose  . acetaminophen (TYLENOL) tablet 650 mg  650 mg Oral Q6H PRN Eduard Clos, MD       Or  . acetaminophen (TYLENOL) suppository 650 mg  650 mg Rectal Q6H PRN Eduard Clos, MD      . aspirin chewable tablet 324 mg  324 mg Oral Once Celene Kras, MD      . aspirin EC tablet 325 mg  325 mg Oral Daily Runell Gess, MD      . atorvastatin (LIPITOR) tablet 80 mg  80 mg Oral q1800 Leeann Must, MD   80 mg at 12/26/12 2130  . gabapentin (NEURONTIN) capsule 300 mg  300 mg Oral QHS Eduard Clos, MD   300 mg at 12/26/12 2130  . HYDROcodone-acetaminophen (NORCO/VICODIN) 5-325 MG per tablet 1-2 tablet  1-2 tablet Oral Q6H PRN Eduard Clos, MD   2 tablet at 12/26/12 0825  . levothyroxine (SYNTHROID, LEVOTHROID) tablet 75 mcg  75 mcg Oral QAC breakfast Eduard Clos, MD   75 mcg at 12/26/12 0825  . nicotine (NICODERM CQ - dosed in mg/24 hours) patch 14 mg  14 mg Transdermal Daily Leeann Must, MD      . ondansetron [facility]) tablet 4 mg  4 mg Oral Q6H PRN Eduard Clos, MD       Or  . ondansetron (ZOFRAN) injection 4 mg  4 mg Intravenous Q6H PRN Eduard Clos, MD      . pantoprazole (PROTONIX) EC tablet 40 mg  40 mg Oral BID Eduard Clos, MD   40 mg at 12/26/12 2130  . propranolol (INDERAL) tablet 40  mg  40 mg Oral BID Eduard Clos, MD   40 mg at 12/26/12 2130  . venlafaxine XR (EFFEXOR-XR) 24 hr capsule 150 mg  150 mg Oral Daily Eduard Clos, MD   150 mg at 12/26/12 0959    PE: General appearance: alert, cooperative and no distress Lungs: clear to auscultation bilaterally Heart: regular rate and rhythm, S1, S2 normal, no murmur, click, rub or gallop Extremities: No LEE Pulses: 2+ and symmetric Neurologic: Grossly normal Skin:  Right wrist- no hematoma or ecchymosis.  Lab Results:   Recent Labs  12/25/12 1956 12/25/12 2030 12/26/12 0340 12/27/12 0610  WBC 8.5  --  5.1 6.7  HGB 12.2 11.2* 10.6* 11.3*  HCT 34.1* 33.0* 29.9* 32.2*  PLT 179  --  156 165   BMET  Recent Labs  12/25/12 2017 12/25/12 2030 12/26/12 0340  NA 140 142 140  K 3.7 3.8 4.0  CL 108 108 110  CO2 25  --  24  GLUCOSE 96 93 89  BUN 18 18 14   CREATININE 0.84 0.90 0.77  CALCIUM 9.2  --  8.8   PT/INR  Recent Labs  12/25/12 2017  LABPROT 13.1  INR 1.00   Cholesterol  Recent Labs  12/26/12 0340  CHOL 149   Lipid Panel     Component Value Date/Time   CHOL 149 12/26/2012 0340   TRIG 86 12/26/2012 0340   HDL 34* 12/26/2012 0340   CHOLHDL 4.4 12/26/2012 0340   VLDL 17 12/26/2012 0340   LDLCALC 98 12/26/2012 0340    Cardiac Enzymes Cardiac Panel (last 3 results)  Recent Labs  12/26/12 0119 12/26/12 0553 12/26/12 1210  TROPONINI <0.30 <0.30 <0.30   Studies/Results: Coronary angiogram  HEMODYNAMICS:  AO SYSTOLIC/AO DIASTOLIC: 98/60  LV SYSTOLIC/LV DIASTOLIC: 100/9  ANGIOGRAPHIC RESULTS:  1. Left main; normal  2. LAD; normal, there was a 40-50% ostial first diagonal branch stenosis  3. Left circumflex; normal.  4. Right coronary artery; dominant and normal  5. Left ventriculography; RAO left ventriculogram was performed using  25 mL of Visipaque dye at 12 mL/second. The overall LVEF estimated  60 % With wall motion abnormalities  IMPRESSION:Mrs. Gruhn has essentially  normal coronary arteries and normal left ventricular function. I believe her chest pain is noncardiac. The sheath was removed and a TR band was placed on the right wrist to achieve patent hemostasis. The patient left the Cath Lab in stable condition. The the plan will be removed in 2 hours, the patient will be gently hydrated and sent home. She'll see mid-level provider in one to 2 weeks.  Runell Gess MD, [facility]  12/26/2012  Assessment/Plan   Principal Problem:   Unstable angina Active Problems:   Hypothyroidism   Tobacco abuse  Plan:   SP left heart cath revealing 40-50% ostial first diag and EF of 60%.  BP and HR stable.  Groin stable.  DC home to day with follow up in 2 weeks.    LOS: 2 days    HAGER, BRYAN 12/27/2012 9:27 AM   Agree with note written by Jones Skene PAC  S/P LHC done radially revealing essentially nl cors and LV. Etiology of CP non cardiac. Exam benign. No further symptoms. OK to D/C home today on PPI. ROV with MLP 1-2 weeks. Pt to F/U with PCP (Dr Collins [place]) to discuss other potential diagnoses.   Runell Gess 12/27/2012 11:31 AM

## 2012-12-27 NOTE — Discharge Summary (Signed)
Physician Discharge Summary  Patient ID: Stacy Fernandez MRN: [id number] DOB/AGE: May 24, 1961 52 y.o.  Admit date: 12/25/2012 Discharge date: 12/27/2012  Admission Diagnoses: Unstable angina  Discharge Diagnoses:  Principal Problem:   Chest pain Active Problems:   Hypothyroidism   Tobacco abuse   Discharged Condition: stable  Hospital Course:  Stacy Fernandez is a 52 yo woman with PMH of hypothyroidism and tobacco abuse who has been having intermittent chest pain for the last 1.5 months. She recently had a CPET on 12/13/12 when dr. Tresa Endo came in because she has CP and told her she might need a LHC.  She presented with chest pain radiating to her left arm and back with associated lightheadedness and diaphoresis beginning around 1700hrs.   In the ER, her initial troponin and ECG were unremarkable and she was admitted to the hospitalist service; however, she continued to have some pain and a nitroglycerin gtt was initiated. She has had mention of questionable 2mm aneurysm vs. other structure so pending CT head ordered by primary team prior to initiation of heparin.  Heparin started.   The patient ruled out for MI with three negative troponins.  She was taken to the cath lab on 12/26/12 for coronary angiography.  This revealed essentially normal coronary arteries with the exception of a 40-50% ostial first diagonal branch stenosis.  LVEF was estimated at 60%.  Lipitor was increased.  The patient was seen by Dr. Allyson Sabal who felt she was stable for DC home.   Consults: None  Significant Diagnostic Studies:   Left heart cath HEMODYNAMICS:  AO SYSTOLIC/AO DIASTOLIC: 98/60  LV SYSTOLIC/LV DIASTOLIC: 100/9  ANGIOGRAPHIC RESULTS:  1. Left main; normal  2. LAD; normal, there was a 40-50% ostial first diagonal branch stenosis  3. Left circumflex; normal.  4. Right coronary artery; dominant and normal  5. Left ventriculography; RAO left ventriculogram was performed using  25 mL of Visipaque dye at 12  mL/second. The overall LVEF estimated  60 % With wall motion abnormalities  IMPRESSION:Stacy Fernandez has essentially normal coronary arteries and normal left ventricular function. I believe her chest pain is noncardiac. The sheath was removed and a TR band was placed on the right wrist to achieve patent hemostasis. The patient left the Cath Lab in stable condition. The the plan will be removed in 2 hours, the patient will be gently hydrated and sent home. She'll see mid-level provider in one to 2 weeks.  Runell Gess MD, [facility]  12/26/2012  Lipid Panel     Component Value Date/Time   CHOL 149 12/26/2012 0340   TRIG 86 12/26/2012 0340   HDL 34* 12/26/2012 0340   CHOLHDL 4.4 12/26/2012 0340   VLDL 17 12/26/2012 0340   LDLCALC 98 12/26/2012 0340   CBC    Component Value Date/Time   WBC 6.7 12/27/2012 0610   RBC 3.57* 12/27/2012 0610   HGB 11.3* 12/27/2012 0610   HCT 32.2* 12/27/2012 0610   PLT 165 12/27/2012 0610   MCV 90.2 12/27/2012 0610   MCH 31.7 12/27/2012 0610   MCHC 35.1 12/27/2012 0610   RDW 12.2 12/27/2012 0610   LYMPHSABS 2.1 12/26/2012 0340   MONOABS 0.3 12/26/2012 0340   EOSABS 0.1 12/26/2012 0340   BASOSABS 0.0 12/26/2012 0340    BMET    Component Value Date/Time   NA 140 12/26/2012 0340   K 4.0 12/26/2012 0340   CL 110 12/26/2012 0340   CO2 24 12/26/2012 0340   GLUCOSE 89 12/26/2012 0340  BUN 14 12/26/2012 0340   CREATININE 0.77 12/26/2012 0340   CALCIUM 8.8 12/26/2012 0340   GFRNONAA >90 12/26/2012 0340   GFRAA >90 12/26/2012 0340    Treatments: See above.  Discharge Exam: Blood pressure 123/60, pulse 78, temperature 97.7 F (36.5 C), temperature source Oral, resp. rate 18, height 5\' 6"  (1.676 m), weight 143 lb 4.8 oz (65 kg), SpO2 100.00%.   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    Increase activity slowly  As directed         Medication List    TAKE these medications       aspirin EC 81 MG tablet  Take 1 tablet (81 mg total) by mouth daily.     atorvastatin 40 MG tablet  Commonly known as:  LIPITOR  Take 1 tablet (40 mg total) by mouth daily at 6 PM.     gabapentin 100 MG capsule  Commonly known as:  NEURONTIN  Take 300 mg by mouth at bedtime.     HYDROcodone-acetaminophen 5-325 MG per tablet  Commonly known as:  NORCO/VICODIN  Take 1-2 tablets by mouth every 6 (six) hours as needed for pain.     levothyroxine 75 MCG tablet  Commonly known as:  SYNTHROID, LEVOTHROID  Take 75 mcg by mouth daily before breakfast.     nitroGLYCERIN 0.4 MG SL tablet  Commonly known as:  NITROSTAT  Place 0.4 mg under the tongue every 5 (five) minutes as needed for chest pain.     pantoprazole 40 MG tablet  Commonly known as:  PROTONIX  Take 40 mg by mouth 2 (two) times daily.     propranolol 40 MG tablet  Commonly known as:  INDERAL  Take 1 tablet (40 mg total) by mouth 2 (two) times daily.     venlafaxine XR 150 MG 24 hr capsule  Commonly known as:  EFFEXOR-XR  Take 150 mg by mouth daily.       Follow-up Information   Follow up with Abelino Derrick, PA-C On 01/06/2013. (3:00 pm)    Contact information:   [address] [place] [address] [phone]      Duration of Discharge Encounter: Less than 30 minutes including physician and PA time.  SignedWilburt Finlay 12/27/2012, 1:59 PM

## 2013-01-02 ENCOUNTER — Institutional Professional Consult (permissible substitution): Payer: BC Managed Care – PPO | Admitting: Pulmonary Disease

## 2013-01-02 ENCOUNTER — Telehealth: Payer: Self-pay | Admitting: Pulmonary Disease

## 2013-01-02 NOTE — Telephone Encounter (Signed)
appt scheduled with RA in [place] 01/03/13. Pt aware of directions. Nothing further was needed

## 2013-01-03 ENCOUNTER — Encounter: Payer: Self-pay | Admitting: Pulmonary Disease

## 2013-01-03 ENCOUNTER — Ambulatory Visit (INDEPENDENT_AMBULATORY_CARE_PROVIDER_SITE_OTHER): Payer: BC Managed Care – PPO | Admitting: Pulmonary Disease

## 2013-01-03 VITALS — BP 112/70 | HR 86 | Temp 98.3°F | Ht 66.5 in | Wt 142.0 lb

## 2013-01-03 DIAGNOSIS — R079 Chest pain, unspecified: Secondary | ICD-10-CM

## 2013-01-03 DIAGNOSIS — F17200 Nicotine dependence, unspecified, uncomplicated: Secondary | ICD-10-CM

## 2013-01-03 DIAGNOSIS — J449 Chronic obstructive pulmonary disease, unspecified: Secondary | ICD-10-CM | POA: Insufficient documentation

## 2013-01-03 DIAGNOSIS — Z720 Tobacco use: Secondary | ICD-10-CM

## 2013-01-03 NOTE — Assessment & Plan Note (Signed)
Smoking cessation primary intervention for COPD

## 2013-01-03 NOTE — Progress Notes (Signed)
Subjective:    Patient ID: Stacy Fernandez, female    DOB: May 30, 1961, 52 y.o.   MRN: [id number]  HPI  PCP- Tammy Spears  52 year old heavy smoker referred for evaluation of chest pain. She reports intermittent substernal chest pain radiating to left shoulder for the last 6 weeks. This is not related to exertion or meals or body position. She reports associated lightheadedness and diaphoresis. She was hospitalized from 6/8-6/10/14, cardiac Showed essentially normal coronary arteries, LVEF was estimated at 60%, troponins were negative. Of note, she has an allergy listed as anaphylaxis to contrast dye and was premedicated prior to the procedure. Nitroglycerin does not provide any relief, he uses propranolol for migraine prophylaxis and Protonix twice a day. She is a remote history of Nissen's fundoplication in 2001 but denies obvious heartburn She underwent cardiopulmonary stress testing in 12/13/12 showed mildly reduced functional status with O2 of 76% predicted. The test was stopped due to chest pain after 8 minutes. There was cardiovascular limitation to exercise with a peak heart rate of 169 and slightly decreased stroke output with increasing heart rate. His is no ventilatory limitation to exercise, but there is mildly increased space Spirometry showed moderate airway obstruction with FEV1 of 58% FVC of 71% and ratio 65, diffusion was 38% EKG was normal and chest x-ray showed hyperinflation without any infiltrates. She also reports chronic headaches for which she sees Dr. Anne Hahn She is currently on medical leave, he is a heavy smoker about one pack per day since her teenage years and has now cut down to half pack per day using an electronic cigarette She did not desaturate on walking 3 laps around the office today      Past Medical History  Diagnosis Date  . Hypothyroidism   . Shortness of breath     2D ECHO, 04/20/2012 - EF >55%, normal; EXERCISE STRESS TEST, 04/20/2012 - normal, EKG  negative for ischemia, no ECG changes  . COPD (chronic obstructive pulmonary disease)   . Emphysema     Past Surgical History  Procedure Laterality Date  . Cholecystectomy    . Abdominal hysterectomy    . Appendectomy      Allergies  Allergen Reactions  . Contrast Media (Iodinated Diagnostic Agents)     Heart stops and breathing stops    History   Social History  . Marital Status: Widowed    Spouse Name: N/A    Number of Children: 4  . Years of Education: N/A   Occupational History  .     Social History Main Topics  . Smoking status: Current Every Day Smoker -- 0.50 packs/day for 37 years    Types: Cigarettes  . Smokeless tobacco: Not on file     Comment: uses e-cig 01/03/13  . Alcohol Use: No  . Drug Use: No  . Sexually Active: Not on file   Other Topics Concern  . Not on file   Social History Narrative  . No narrative on file    Family History  Problem Relation Age of Onset  . Heart failure Mother   . Colon cancer Father   . Throat cancer Brother   . Heart attack Maternal Grandmother   . Lung cancer Maternal Grandfather   . Colon cancer Paternal Grandmother   . Cancer Paternal Grandfather     kidney and bladder  . Lung cancer Brother   . Throat cancer Brother   . Lung cancer Brother      Review of Systems  Constitutional:  Positive for unexpected weight change. Negative for appetite change.  HENT: Positive for sore throat and trouble swallowing. Negative for ear pain, congestion, sneezing and dental problem.   Respiratory: Positive for cough and shortness of breath.   Cardiovascular: Positive for chest pain. Negative for palpitations and leg swelling.  Gastrointestinal: Negative for abdominal pain.  Musculoskeletal: Positive for joint swelling.  Neurological: Positive for headaches.  Psychiatric/Behavioral: The patient is not nervous/anxious.        Objective:   Physical Exam  Gen. Pleasant, well-nourished, in no distress, normal affect ENT  - no lesions, no post nasal drip Neck: No JVD, no thyromegaly, no carotid bruits Lungs: no use of accessory muscles, no dullness to percussion, clear without rales or rhonchi  Cardiovascular: Rhythm regular, heart sounds  normal, no murmurs or gallops, no peripheral edema Abdomen: soft and non-tender, no hepatosplenomegaly, BS normal. Musculoskeletal: No deformities, no cyanosis or clubbing Neuro:  alert, non focal        Assessment & Plan:

## 2013-01-03 NOTE — Patient Instructions (Addendum)
You have moderate COPD Trial of dulera -2 puffs twice daily Swallowing study for motility of food pipe Blood work, if abnormal, will schedule scan of lungs

## 2013-01-03 NOTE — Assessment & Plan Note (Signed)
Unclear cause - coronary disease ruled out. Doubt pleurisy or pulmonary embolism - no risk factors. No clear zosteriform rash. Esophageal dysmotility - years after Nissen's may be a possibility.  Swallowing study for motility of food pipe - GI referral is planned Chk d-dimer, if abnormal, will schedule scan of lungs, If Ct angio - will need pre-medication prior to contrast - have asked her to hydrate well since she received dye 1 wk ago for cath

## 2013-01-03 NOTE — Assessment & Plan Note (Signed)
You have moderate COPD Trial of dulera -2 puffs twice daily

## 2013-01-04 ENCOUNTER — Telehealth: Payer: Self-pay | Admitting: Internal Medicine

## 2013-01-04 LAB — D-DIMER, QUANTITATIVE: D-Dimer, Quant: 0.95 ug/mL-FEU — ABNORMAL HIGH (ref 0.00–0.48)

## 2013-01-04 NOTE — Telephone Encounter (Signed)
Pt states she was in the hospital recently and also seen by her pulmonary doc. Per pt she states she needs to be seen due to possible esophageal motility problems. Pt scheduled to see Mike Gip PA tomorrow at 10am. Pt aware of appt date and time.

## 2013-01-05 ENCOUNTER — Ambulatory Visit (HOSPITAL_COMMUNITY)
Admission: RE | Admit: 2013-01-05 | Discharge: 2013-01-05 | Disposition: A | Discharge status: Home / Self Care | Payer: BC Managed Care – PPO | Source: Ambulatory Visit | Attending: Adult Health | Admitting: Adult Health

## 2013-01-05 ENCOUNTER — Encounter: Payer: Self-pay | Admitting: Physician Assistant

## 2013-01-05 ENCOUNTER — Encounter (HOSPITAL_COMMUNITY): Payer: Self-pay

## 2013-01-05 ENCOUNTER — Other Ambulatory Visit: Payer: Self-pay | Admitting: Adult Health

## 2013-01-05 ENCOUNTER — Observation Stay (HOSPITAL_COMMUNITY)
Admission: AD | Admit: 2013-01-05 | Discharge: 2013-01-06 | Disposition: A | Discharge status: Home / Self Care | Payer: BC Managed Care – PPO | Source: Ambulatory Visit | Attending: Pulmonary Disease | Admitting: Pulmonary Disease

## 2013-01-05 ENCOUNTER — Other Ambulatory Visit: Payer: Self-pay

## 2013-01-05 ENCOUNTER — Ambulatory Visit (INDEPENDENT_AMBULATORY_CARE_PROVIDER_SITE_OTHER): Payer: BC Managed Care – PPO | Admitting: Physician Assistant

## 2013-01-05 ENCOUNTER — Other Ambulatory Visit (HOSPITAL_COMMUNITY): Payer: Self-pay

## 2013-01-05 VITALS — BP 90/60 | HR 72 | Ht 66.5 in | Wt 142.0 lb

## 2013-01-05 DIAGNOSIS — I2699 Other pulmonary embolism without acute cor pulmonale: Secondary | ICD-10-CM

## 2013-01-05 DIAGNOSIS — Z720 Tobacco use: Secondary | ICD-10-CM

## 2013-01-05 DIAGNOSIS — E785 Hyperlipidemia, unspecified: Secondary | ICD-10-CM | POA: Insufficient documentation

## 2013-01-05 DIAGNOSIS — K219 Gastro-esophageal reflux disease without esophagitis: Secondary | ICD-10-CM | POA: Insufficient documentation

## 2013-01-05 DIAGNOSIS — Z9889 Other specified postprocedural states: Secondary | ICD-10-CM | POA: Insufficient documentation

## 2013-01-05 DIAGNOSIS — Z09 Encounter for follow-up examination after completed treatment for conditions other than malignant neoplasm: Secondary | ICD-10-CM

## 2013-01-05 DIAGNOSIS — R079 Chest pain, unspecified: Secondary | ICD-10-CM

## 2013-01-05 DIAGNOSIS — J4489 Other specified chronic obstructive pulmonary disease: Secondary | ICD-10-CM | POA: Insufficient documentation

## 2013-01-05 DIAGNOSIS — Z79899 Other long term (current) drug therapy: Secondary | ICD-10-CM | POA: Insufficient documentation

## 2013-01-05 DIAGNOSIS — R0609 Other forms of dyspnea: Secondary | ICD-10-CM

## 2013-01-05 DIAGNOSIS — R0989 Other specified symptoms and signs involving the circulatory and respiratory systems: Secondary | ICD-10-CM

## 2013-01-05 DIAGNOSIS — J449 Chronic obstructive pulmonary disease, unspecified: Secondary | ICD-10-CM

## 2013-01-05 DIAGNOSIS — F17200 Nicotine dependence, unspecified, uncomplicated: Secondary | ICD-10-CM | POA: Insufficient documentation

## 2013-01-05 DIAGNOSIS — R1310 Dysphagia, unspecified: Secondary | ICD-10-CM

## 2013-01-05 DIAGNOSIS — R0600 Dyspnea, unspecified: Secondary | ICD-10-CM

## 2013-01-05 DIAGNOSIS — E039 Hypothyroidism, unspecified: Secondary | ICD-10-CM

## 2013-01-05 LAB — CBC WITH DIFFERENTIAL/PLATELET
Basophils Absolute: 0 10*3/uL (ref 0.0–0.1)
Basophils Relative: 1 % (ref 0–1)
Eosinophils Relative: 1 % (ref 0–5)
HCT: 33.8 % — ABNORMAL LOW (ref 36.0–46.0)
Hemoglobin: 11.8 g/dL — ABNORMAL LOW (ref 12.0–15.0)
MCHC: 34.9 g/dL (ref 30.0–36.0)
MCV: 90.6 fL (ref 78.0–100.0)
Monocytes Absolute: 0.5 10*3/uL (ref 0.1–1.0)
Monocytes Relative: 9 % (ref 3–12)
Neutro Abs: 3.2 10*3/uL (ref 1.7–7.7)
RDW: 12.7 % (ref 11.5–15.5)

## 2013-01-05 LAB — BASIC METABOLIC PANEL
BUN: 15 mg/dL (ref 6–23)
CO2: 27 mEq/L (ref 19–32)
Chloride: 104 mEq/L (ref 96–112)
Creatinine, Ser: 0.81 mg/dL (ref 0.50–1.10)
Glucose, Bld: 89 mg/dL (ref 70–99)
Potassium: 4.4 mEq/L (ref 3.5–5.1)

## 2013-01-05 MED ORDER — BUDESONIDE-FORMOTEROL FUMARATE 80-4.5 MCG/ACT IN AERO
2.0000 | INHALATION_SPRAY | Freq: Two times a day (BID) | RESPIRATORY_TRACT | Status: DC
Start: 2013-01-06 — End: 2013-01-06
  Filled 2013-01-05: qty 6.9

## 2013-01-05 MED ORDER — HEPARIN BOLUS VIA INFUSION
4000.0000 [IU] | Freq: Once | INTRAVENOUS | Status: AC
  Administered 2013-01-05: 4000 [IU] via INTRAVENOUS
  Filled 2013-01-05: qty 4000

## 2013-01-05 MED ORDER — TECHNETIUM TO 99M ALBUMIN AGGREGATED
5.1000 | Freq: Once | INTRAVENOUS | Status: AC | PRN
  Administered 2013-01-05: 5.1 via INTRAVENOUS

## 2013-01-05 MED ORDER — HYDROCODONE-ACETAMINOPHEN 10-325 MG PO TABS
1.0000 | ORAL_TABLET | ORAL | Status: DC | PRN
  Filled 2013-01-05: qty 1

## 2013-01-05 MED ORDER — NICOTINE 21 MG/24HR TD PT24
21.0000 mg | MEDICATED_PATCH | Freq: Every day | TRANSDERMAL | Status: DC
  Administered 2013-01-05 – 2013-01-06 (×2): 21 mg via TRANSDERMAL
  Filled 2013-01-05 (×2): qty 1

## 2013-01-05 MED ORDER — DIPHENHYDRAMINE HCL 50 MG PO CAPS
50.0000 mg | ORAL_CAPSULE | Freq: Once | ORAL | Status: AC
  Administered 2013-01-06: 50 mg via ORAL
  Filled 2013-01-05: qty 1

## 2013-01-05 MED ORDER — METHYLPREDNISOLONE SODIUM SUCC 40 MG IJ SOLR
40.0000 mg | Freq: Four times a day (QID) | INTRAMUSCULAR | Status: AC
  Administered 2013-01-05 – 2013-01-06 (×3): 40 mg via INTRAVENOUS
  Filled 2013-01-05 (×3): qty 1

## 2013-01-05 MED ORDER — TECHNETIUM TC 99M DIETHYLENETRIAME-PENTAACETIC ACID: 45.0000 | Freq: Once | INTRAVENOUS | Status: DC | PRN

## 2013-01-05 MED ORDER — HEPARIN (PORCINE) IN NACL 100-0.45 UNIT/ML-% IJ SOLN
1000.0000 [IU]/h | INTRAMUSCULAR | Status: DC
  Administered 2013-01-05: 900 [IU]/h via INTRAVENOUS
  Filled 2013-01-05 (×2): qty 250

## 2013-01-05 MED ORDER — DIPHENHYDRAMINE HCL 50 MG PO CAPS
50.0000 mg | ORAL_CAPSULE | Freq: Once | ORAL | Status: DC
  Filled 2013-01-05: qty 1

## 2013-01-05 NOTE — Patient Instructions (Addendum)
You have been scheduled for an endoscopy with propofol. Please follow written instructions given to you at your visit today. If you use inhalers (even only as needed), please bring them with you on the day of your procedure. Your physician has requested that you go to www.startemmi.com and enter the access code given to you at your visit today. This web site gives a general overview about your procedure. However, you should still follow specific instructions given to you by our office regarding your preparation for the procedure. Go to Pulmonary 2nd floor and see Tammy Belia Heman

## 2013-01-05 NOTE — H&P (Signed)
PULMONARY  / CRITICAL CARE MEDICINE  Name: Stacy Fernandez MRN: [id number] DOB: August 02, 1960    ADMISSION DATE:  01/05/2013 CONSULTATION DATE:  01/05/2013  REFERRING MD :  Vassie Loll PRIMARY SERVICE: PCCM  CHIEF COMPLAINT:  Chest pain  BRIEF PATIENT DESCRIPTION: 52 y/o female with COPD was admitted on 6/19 for probably non-massive pulmonary embolism.  SIGNIFICANT EVENTS / STUDIES:  2002 CT chest >> extensive emphysema in the RUL, some in the LUL, plate-like atelectasis in the RML 6/19 V/Q scan > intermediate prob PE  LINES / TUBES:   CULTURES:   ANTIBIOTICS:   HISTORY OF PRESENT ILLNESS:  52 y/o female with COPD was admitted on 6/19 for probably non-massive pulmonary embolism. She noted 6 weeks of chest pain when seen by Dr. Vassie Loll in the office on 6/17.  A cardiac catheterization had been performed earlier in June without evidence of obstructive CAD or LV dysfunction.  She describes the chest pain as substernal and radiating to the left chest.  She had some "stiffness" and pain in the right chest as well, but this has not been as prominent.  She underwent a cardiopulmonary stress test during which was stopped for chest pain after the patient only reached 80 watts (150 predicted) and VO2 max of 77% predicted.   After seeing Dr. Vassie Loll she had a d-dimer which was positive.  A V/Q scan performed on 6/19 showed intermediate probability for PE so she was admitted and started on heparin.  PAST MEDICAL HISTORY :  Past Medical History  Diagnosis Date  . Hypothyroidism   . Shortness of breath     2D ECHO, 04/20/2012 - EF >55%, normal; EXERCISE STRESS TEST, 04/20/2012 - normal, EKG negative for ischemia, no ECG changes  . COPD (chronic obstructive pulmonary disease)   . Emphysema    Past Surgical History  Procedure Laterality Date  . Cholecystectomy    . Abdominal hysterectomy    . Appendectomy    . Endo wrap     Prior to Admission medications   Medication Sig Start Date End Date Taking?  Authorizing Provider  aspirin 81 MG tablet Take 1 tablet (81 mg total) by mouth daily. 12/27/12   Wilburt Finlay, PA-C  atorvastatin (LIPITOR) 40 MG tablet Take 1 tablet (40 mg total) by mouth daily at 6 PM. 12/27/12   Wilburt Finlay, PA-C  gabapentin (NEURONTIN) 100 MG capsule Take 300 mg by mouth at bedtime.    Historical Provider, MD  HYDROcodone-acetaminophen (NORCO/VICODIN) 5-325 MG per tablet Take 1-2 tablets by mouth every 6 (six) hours as needed for pain.    Historical Provider, MD  ketorolac (TORADOL) 10 MG tablet  11/28/12   Historical Provider, MD  levothyroxine (SYNTHROID, LEVOTHROID) 75 MCG tablet Take 75 mcg by mouth daily before breakfast.    Historical Provider, MD  meclizine (ANTIVERT) 25 MG tablet Take 25 mg by mouth 3 (three) times daily as needed.    Historical Provider, MD  nitroGLYCERIN (NITROSTAT) 0.4 MG SL tablet Place 0.4 mg under the tongue every 5 (five) minutes as needed for chest pain.    Historical Provider, MD  pantoprazole (PROTONIX) 40 MG tablet Take 40 mg by mouth 2 (two) times daily.    Historical Provider, MD  propranolol (INDERAL) 40 MG tablet Take 1 tablet (40 mg total) by mouth 2 (two) times daily. 11/15/12   York Spaniel, MD  venlafaxine XR (EFFEXOR-XR) 150 MG 24 hr capsule Take 150 mg by mouth daily.    Historical Provider, MD  Allergies  Allergen Reactions  . Contrast Media (Iodinated Diagnostic Agents)     Heart stops and breathing stops    FAMILY HISTORY:  Family History  Problem Relation Age of Onset  . Heart failure Mother   . Colon cancer Father   . Throat cancer Brother   . Heart attack Maternal Grandmother   . Lung cancer Maternal Grandfather   . Colon cancer Paternal Grandmother   . Cancer Paternal Grandfather     kidney and bladder  . Lung cancer Brother   . Throat cancer Brother   . Lung cancer Brother    SOCIAL HISTORY:  reports that she has been smoking Cigarettes.  She has a 18.5 pack-year smoking history. She uses smokeless  tobacco. She reports that she does not drink alcohol or use illicit drugs.  REVIEW OF SYSTEMS:   Gen: Denies fever, chills, weight change, fatigue, night sweats HEENT: Denies blurred vision, double vision, hearing loss, tinnitus, sinus congestion, rhinorrhea, sore throat, neck stiffness, dysphagia PULM: per HPI CV: + chest pain, edema, denies orthopnea, paroxysmal nocturnal dyspnea, palpitations GI: Denies abdominal pain, nausea, vomiting, diarrhea, hematochezia, melena, constipation, change in bowel habits GU: Denies dysuria, hematuria, polyuria, oliguria, urethral discharge Endocrine: Denies hot or cold intolerance, polyuria, polyphagia or appetite change Derm: Denies rash, dry skin, scaling or peeling skin change Heme: Denies easy bruising, bleeding, bleeding gums Neuro: Denies headache, numbness, weakness, slurred speech, loss of memory or consciousness   SUBJECTIVE:   VITAL SIGNS: Temp:  [98 F (36.7 C)-98.5 F (36.9 C)] 98 F (36.7 C) (06/19 2100) Pulse Rate:  [71-73] 73 (06/19 2100) Resp:  [18] 18 (06/19 2100) BP: (90-101)/(60-68) 101/60 mmHg (06/19 2100) SpO2:  [98 %-99 %] 99 % (06/19 2100) Weight:  [64.411 kg (142 lb)] 64.411 kg (142 lb) (06/19 1001) HEMODYNAMICS:   VENTILATOR SETTINGS:   INTAKE / OUTPUT: Intake/Output   None     PHYSICAL EXAMINATION:  Gen: well appearing, no acute distress HEENT: NCAT, PERRL, EOMi, OP clear, neck supple without masses PULM: Poor air movement RUL, normal otherwise CV: RRR, no mgr, no JVD AB: BS+, soft, nontender, no hsm Ext: warm, no edema, no clubbing, no cyanosis Derm: no rash or skin breakdown Neuro: A&Ox4, CN II-XII intact, strength 5/5 in all 4 extremities   LABS:  Recent Labs Lab 01/05/13 1823 01/05/13 1830  HGB  --  11.8*  WBC  --  6.0  PLT  --  257  NA  --  139  K  --  4.4  CL  --  104  CO2  --  27  GLUCOSE  --  89  BUN  --  15  CREATININE  --  0.81  CALCIUM  --  9.5  TROPONINI <0.30  --    No  results found for this basename: GLUCAP,  in the last 168 hours  CXR: Emphysema RUL EKG: NSR, No ST wave changes  ASSESSMENT / PLAN:  PULMONARY A: Matched ventilatory and perfusion deficit in the RUL likely due to severe emphysema;  Elevated D-dimer without definitive evidence of pulmonary embolism at this point Modified wells criteria for PE is low Moderate COPD without exacerbation Ongoing tobacco abuse P:   -CT angio chest 6/20 am after premedication for contrast dye allergy -heparin gtt until results of CT angio back -LE doppler ultrasound to look for DVT -d/c heparin if no PE -symbicort bid in hospital, resume dulera as outpatient  CARDIOVASCULAR A: Chest pain, etiology uncertain P:  -if CT angio  chest negative, consider GI evaluation   GASTROINTESTINAL A:  GERD, s/p NISSEN P:   -outpatient GI evaluation/swallow evaluation  TODAY'S SUMMARY: 52 y/o female with upper lobe emphysema admitted for possible pulmonary embolism after intermediate prob V/Q scan.  Doubt PE, but reasonable to treat with heparin until CT angio chest back.  Needs pre-medications for CT angio.  May need outpatient GI evaluation if no PE.  MCQUAID, DOUGLAS,MD Pulmonary and Critical Care Medicine [facility] Pager: [phone]  01/05/2013, 10:35 PM

## 2013-01-05 NOTE — Progress Notes (Signed)
Subjective:    Patient ID: Stacy Fernandez, female    DOB: 09/10/1960, 52 y.o.   MRN: [id number]  HPI  Stacy Fernandez is a pleasant 52 year old white female referred today by [facility] for evaluation of chest pain. She has history of a remote Nissen fundoplication done in 2001.. She reports very remote EGD with Dr. Marina Fernandez or Dr. Corinda Fernandez  just prior to that time She also has mild COPD. Patient says she has been having intermittent chest pain over the past 2 months without any discrete pattern. She does not feel that the pain is necessarily exertional or related to eating movement et Karie Soda. She complains of pain in her mid and left chest with radiation up into her left shoulder. She says over the past 2 weeks her pain has been constant. She has also had an associated sense of air hunger or shortness of breath. He has not had any cough or sputum production no fever or chills. She has no complaints of abdominal pain appear she was hospitalized 12/26/2011 12/27/2012 and underwent cardiac workup to rule out unstable angina. Troponin was negative as was her initial EKG. She ruled out for MI and then had cardiac catheter on 12/26/2012 which showed essentially normal coronary arteries with the exception of a 40-50% ostial first diagonal branch stenosis left ventricular ejection fraction was estimated at 60%. She was discharged home to pursue remainder of workup outpatient. After seeing Stacy Fernandez  yesterday she was scheduled for barium swallow and had a d-dimer drawn. Plan was for CT angio  Chest if  d-dimer was elevated. Patient has been on twice-daily Protonix over the past couple of weeks with absolutely no changes in her symptoms She does mention that she has a sensation of food sticking at times and this is occurring with solids and liquids though not on a daily basis. She has not had any episodes of regurgitation.    Review of Systems  Constitutional: Positive for activity change.  HENT: Positive for trouble swallowing.    Eyes: Negative.   Respiratory: Positive for chest tightness and shortness of breath.   Cardiovascular: Positive for chest pain.  Gastrointestinal: Negative.   Endocrine: Negative.   Genitourinary: Negative.   Musculoskeletal: Negative.   Skin: Negative.   Allergic/Immunologic: Negative.   Neurological: Negative.   Hematological: Negative.   Psychiatric/Behavioral: Negative.    Outpatient Prescriptions Prior to Visit  Medication Sig Dispense Refill  . aspirin 81 MG tablet Take 1 tablet (81 mg total) by mouth daily.  30 tablet  0  . atorvastatin (LIPITOR) 40 MG tablet Take 1 tablet (40 mg total) by mouth daily at 6 PM.  30 tablet  5  . gabapentin (NEURONTIN) 100 MG capsule Take 300 mg by mouth at bedtime.      Marland Kitchen HYDROcodone-acetaminophen (NORCO/VICODIN) 5-325 MG per tablet Take 1-2 tablets by mouth every 6 (six) hours as needed for pain.      Marland Kitchen levothyroxine (SYNTHROID, LEVOTHROID) 75 MCG tablet Take 75 mcg by mouth daily before breakfast.      . nitroGLYCERIN (NITROSTAT) 0.4 MG SL tablet Place 0.4 mg under the tongue every 5 (five) minutes as needed for chest pain.      . pantoprazole (PROTONIX) 40 MG tablet Take 40 mg by mouth 2 (two) times daily.      . propranolol (INDERAL) 40 MG tablet Take 1 tablet (40 mg total) by mouth 2 (two) times daily.  60 tablet  1  . venlafaxine XR (EFFEXOR-XR) 150 MG 24 hr  capsule Take 150 mg by mouth daily.       No facility-administered medications prior to visit.   Allergies  Allergen Reactions  . Contrast Media (Iodinated Diagnostic Agents)     Heart stops and breathing stops     Patient Active Problem List   Diagnosis Date Noted  . S/P Nissen fundoplication (without gastrostomy tube) procedure 01/05/2013  . COPD (chronic obstructive pulmonary disease) 01/03/2013  . Chest pain 12/25/2012  . Hypothyroidism 12/25/2012  . Tobacco abuse 12/25/2012   History  Substance Use Topics  . Smoking status: Current Every Day Smoker -- 0.50 packs/day  for 37 years    Types: Cigarettes  . Smokeless tobacco: Current User     Comment: uses e-cig 01/03/13  . Alcohol Use: No   family history includes Cancer in her paternal grandfather; Colon cancer in her father and paternal grandmother; Heart attack in her maternal grandmother; Heart failure in her mother; Lung cancer in her brothers and maternal grandfather; and Throat cancer in her brothers.     Objective:   Physical Exam  Well-developed white female in no acute distress, uncomfortable appearing blood pressure 90/60 pulse 72 height 5 foot 6 weight 142. HEENT; nontraumatic normocephalic EOMI PERRLA sclera anicteric, Neck; supple no JVD, Cardiovascular ;regular rate and rhythm with S1-S2 no murmur or gallop there is no tenderness to palpation of the chest wall or anterior ribs., Pulmonary; clear bilaterally, Abdomen soft nontender no palpable mass or hepatosplenomegaly bowel sounds are active, Rectal; exam not done, Extremities; no clubbing cyanosis or edema skin warm and dry, Psych; mood and affect normal and appropriate       Assessment & Plan:  #88  52 year old white female with 2 month history of intermittent chest pain and two-week history of constant chest pain with associated dyspnea Cardiac workup negative including cardiac cath She did have an elevated d-dimer yesterday raising the possibility of pulmonary embolism. I have discussed with Stacy Spearing NP  In  Pulmonary; regarding proceeding with VQ scan versus CT angio and  They will pursue  this within the next 24 hours Patient will continue Protonix 40 mg by mouth twice daily Have tentatively scheduled for upper endoscopy with Dr. Marina Fernandez with possible esophageal dilation-this may need to be canceled  Or delayed if she has positive findings on CT  Angio.

## 2013-01-05 NOTE — Progress Notes (Addendum)
ANTICOAGULATION CONSULT NOTE - Initial Consult  Pharmacy Consult for Heparin Indication: rule out pulmonary embolus  Allergies  Allergen Reactions  . Contrast Media (Iodinated Diagnostic Agents)     Heart stops and breathing stops    Patient Measurements:  Weight 64.4 kg on 01/05/13 Height 169 cm on 01/05/13 IBW 60.5 kg Heparin Dosing Weight: 64 kg  Vital Signs: BP: 90/60 mmHg (06/19 1001) Pulse Rate: 72 (06/19 1001)  Labs: No results found for this basename: HGB, HCT, PLT, APTT, LABPROT, INR, HEPARINUNFRC, CREATININE, CKTOTAL, CKMB, TROPONINI,  in the last 72 hours  The CrCl is unknown because both a height and weight (above a minimum accepted value) are required for this calculation.   Medical History: Past Medical History  Diagnosis Date  . Hypothyroidism   . Shortness of breath     2D ECHO, 04/20/2012 - EF >55%, normal; EXERCISE STRESS TEST, 04/20/2012 - normal, EKG negative for ischemia, no ECG changes  . COPD (chronic obstructive pulmonary disease)   . Emphysema     Medications:  Prescriptions prior to admission  Medication Sig Dispense Refill  . aspirin 81 MG tablet Take 1 tablet (81 mg total) by mouth daily.  30 tablet  0  . atorvastatin (LIPITOR) 40 MG tablet Take 1 tablet (40 mg total) by mouth daily at 6 PM.  30 tablet  5  . gabapentin (NEURONTIN) 100 MG capsule Take 300 mg by mouth at bedtime.      Marland Kitchen HYDROcodone-acetaminophen (NORCO/VICODIN) 5-325 MG per tablet Take 1-2 tablets by mouth every 6 (six) hours as needed for pain.      Marland Kitchen ketorolac (TORADOL) 10 MG tablet       . levothyroxine (SYNTHROID, LEVOTHROID) 75 MCG tablet Take 75 mcg by mouth daily before breakfast.      . meclizine (ANTIVERT) 25 MG tablet Take 25 mg by mouth 3 (three) times daily as needed.      . nitroGLYCERIN (NITROSTAT) 0.4 MG SL tablet Place 0.4 mg under the tongue every 5 (five) minutes as needed for chest pain.      . pantoprazole (PROTONIX) 40 MG tablet Take 40 mg by mouth 2 (two)  times daily.      . propranolol (INDERAL) 40 MG tablet Take 1 tablet (40 mg total) by mouth 2 (two) times daily.  60 tablet  1  . venlafaxine XR (EFFEXOR-XR) 150 MG 24 hr capsule Take 150 mg by mouth daily.        Assessment: 52 YO female s/p recent admission from 6/8-6/11/14 for chest pain evaluation in which patient received IV heparin now readmitted to start IV heparin for rule out PE due to persistent chest pain and newly elevated d-dimer. In previous admission, patient never reached therapeutic level on 800 units/hr prior to drug discontinuation and cardiac workup was negative. INR on last admission was 1. Last CBC from 6/10- H/H were slightly low but stable and platelets were 165. Patient has not taken anticoagulants as an outpatient prior to this admission. Plan for CT Angio of chest and LE Dopplers.   Goal of Therapy:  Heparin level 0.3-0.7 units/ml Monitor platelets by anticoagulation protocol: Yes   Plan:  1. Heparin bolus of 4000 units IV x1 followed by a heparin drip rate of 900 units/hr. 2. Heparin level  in 6 hours to assess. 3. Daily heparin level and CBC while on therapy.  4. Follow-up CT Angio of chest and LE dopplers.  Link Snuffer, PharmD, BCPS Clinical Pharmacist [phone]  01/05/2013,6:06 PM

## 2013-01-06 ENCOUNTER — Ambulatory Visit: Payer: BC Managed Care – PPO | Admitting: Cardiology

## 2013-01-06 ENCOUNTER — Observation Stay (HOSPITAL_COMMUNITY): Payer: BC Managed Care – PPO

## 2013-01-06 ENCOUNTER — Encounter (HOSPITAL_COMMUNITY): Payer: Self-pay | Admitting: *Deleted

## 2013-01-06 ENCOUNTER — Encounter: Payer: Self-pay | Admitting: Internal Medicine

## 2013-01-06 ENCOUNTER — Other Ambulatory Visit (HOSPITAL_COMMUNITY): Payer: BC Managed Care – PPO

## 2013-01-06 DIAGNOSIS — Z09 Encounter for follow-up examination after completed treatment for conditions other than malignant neoplasm: Secondary | ICD-10-CM

## 2013-01-06 DIAGNOSIS — R079 Chest pain, unspecified: Secondary | ICD-10-CM

## 2013-01-06 LAB — HEPARIN LEVEL (UNFRACTIONATED): Heparin Unfractionated: 0.47 IU/mL (ref 0.30–0.70)

## 2013-01-06 MED ORDER — HEPARIN SODIUM (PORCINE) 5000 UNIT/ML IJ SOLN
5000.0000 [IU] | Freq: Three times a day (TID) | INTRAMUSCULAR | Status: DC
  Filled 2013-01-06 (×3): qty 1

## 2013-01-06 MED ORDER — KETOROLAC TROMETHAMINE 10 MG PO TABS: 10.0000 mg | ORAL_TABLET | Freq: Four times a day (QID) | ORAL | Status: DC | PRN

## 2013-01-06 MED ORDER — SUCRALFATE 1 GM/10ML PO SUSP
1.0000 g | Freq: Four times a day (QID) | ORAL | Status: DC
  Filled 2013-01-06 (×3): qty 10

## 2013-01-06 MED ORDER — SUCRALFATE 1 GM/10ML PO SUSP: 1.0000 g | Freq: Four times a day (QID) | ORAL | Status: DC

## 2013-01-06 MED ORDER — IOHEXOL 350 MG/ML SOLN
100.0000 mL | Freq: Once | INTRAVENOUS | Status: AC | PRN
  Administered 2013-01-06: 78 mL via INTRAVENOUS

## 2013-01-06 NOTE — Progress Notes (Signed)
ANTICOAGULATION CONSULT NOTE - Initial Consult  Pharmacy Consult for Heparin Indication: rule out pulmonary embolus  Allergies  Allergen Reactions  . Contrast Media (Iodinated Diagnostic Agents)     Heart stops and breathing stops    Patient Measurements: Weight: 143 lb 15.4 oz (65.3 kg)Weight 64.4 kg on 01/05/13 Height 169 cm on 01/05/13 IBW 60.5 kg Heparin Dosing Weight: 64 kg  Vital Signs: Temp: 97.8 F (36.6 C) (06/20 0500) Temp src: Oral (06/20 0500) BP: 101/65 mmHg (06/20 0500) Pulse Rate: 74 (06/20 0500)  Labs:  Recent Labs  01/05/13 1823 01/05/13 1830 01/06/13 0050 01/06/13 0853  HGB  --  11.8*  --   --   HCT  --  33.8*  --   --   PLT  --  257  --   --   HEPARINUNFRC  --   --  0.35 0.47  CREATININE  --  0.81  --   --   TROPONINI <0.30  --   --   --     The CrCl is unknown because both a height and weight (above a minimum accepted value) are required for this calculation.   Medical History: Past Medical History  Diagnosis Date  . Hypothyroidism   . Shortness of breath     2D ECHO, 04/20/2012 - EF >55%, normal; EXERCISE STRESS TEST, 04/20/2012 - normal, EKG negative for ischemia, no ECG changes  . COPD (chronic obstructive pulmonary disease)   . Emphysema     Medications:  Prescriptions prior to admission  Medication Sig Dispense Refill  . aspirin 81 MG tablet Take 1 tablet (81 mg total) by mouth daily.  30 tablet  0  . atorvastatin (LIPITOR) 40 MG tablet Take 1 tablet (40 mg total) by mouth daily at 6 PM.  30 tablet  5  . gabapentin (NEURONTIN) 100 MG capsule Take 300 mg by mouth at bedtime.      Marland Kitchen HYDROcodone-acetaminophen (NORCO/VICODIN) 5-325 MG per tablet Take 1-2 tablets by mouth every 6 (six) hours as needed for pain.      Marland Kitchen ketorolac (TORADOL) 10 MG tablet       . levothyroxine (SYNTHROID, LEVOTHROID) 75 MCG tablet Take 75 mcg by mouth daily before breakfast.      . meclizine (ANTIVERT) 25 MG tablet Take 25 mg by mouth 3 (three) times daily as  needed.      . nitroGLYCERIN (NITROSTAT) 0.4 MG SL tablet Place 0.4 mg under the tongue every 5 (five) minutes as needed for chest pain.      . pantoprazole (PROTONIX) 40 MG tablet Take 40 mg by mouth 2 (two) times daily.      . propranolol (INDERAL) 40 MG tablet Take 1 tablet (40 mg total) by mouth 2 (two) times daily.  60 tablet  1  . venlafaxine XR (EFFEXOR-XR) 150 MG 24 hr capsule Take 150 mg by mouth daily.        Assessment: 52 YO female s/p recent admission from 6/8-6/11/14 for chest pain evaluation which was negative, now readmitted to start IV heparin for rule out PE due to persistent chest pain and newly elevated d-dimer.  VQ revealing intermediate probability for PE.  HL=0.47, at goal.  No bleeding noted.    Goal of Therapy:  Heparin level 0.3-0.7 units/ml Monitor platelets by anticoagulation protocol: Yes   Plan:  Continue IV heparin at 1000units/hr. Check heparin level/cbc daily.  Wendie Simmer, PharmD, BCPS Clinical Pharmacist  Pager: [phone]

## 2013-01-06 NOTE — Progress Notes (Signed)
IV heparin discontinued per orders. Pt discharged per orders. IV sites discontinued. All discharge orders/instructions discussed with patient. Pt verbalized understanding of orders/instructions including her follow up appt. At [facility] long for her endoscopic procedure. Time allowed for questions and concerns. Pt requested information regarding paperwork to submit for her disability claim. Pt advised to contact medical records Monday to obtain necessary documents.  Alwyn Ren RN

## 2013-01-06 NOTE — Progress Notes (Signed)
ANTICOAGULATION CONSULT NOTE - Follow Up Consult  Pharmacy Consult for heparin Indication: r/o PE  Labs:  Recent Labs  01/05/13 1823 01/05/13 1830 01/06/13 0050  HGB  --  11.8*  --   HCT  --  33.8*  --   PLT  --  257  --   HEPARINUNFRC  --   --  0.35  CREATININE  --  0.81  --   TROPONINI <0.30  --   --     Assessment: 52yo female therapeutic on heparin with initial dosing though at low end of goal with VQ revealing intermediate probability for PE.  Goal of Therapy:  Heparin level 0.3-0.7 units/ml   Plan:  Will increase heparin gtt to 1000 units/hr and check level in 6hr.  Vernard Gambles, PharmD, BCPS  01/06/2013,1:39 AM

## 2013-01-06 NOTE — Progress Notes (Signed)
PULMONARY  / CRITICAL CARE MEDICINE  Name: Stacy Fernandez MRN: [id number] DOB: [birth year]    ADMISSION DATE:  01/05/2013 CONSULTATION DATE:  01/05/2013  REFERRING MD :  Vassie Loll PRIMARY SERVICE: PCCM  CHIEF COMPLAINT:  Chest pain  BRIEF PATIENT DESCRIPTION: 52 y/o female with COPD was admitted on 6/19 for probably non-massive pulmonary embolism.  SIGNIFICANT EVENTS / STUDIES:  2002 CT chest >> extensive emphysema in the RUL, some in the LUL, plate-like atelectasis in the RML 6/19 V/Q scan > intermediate prob PE  LINES / TUBES: PIV  SUBJECTIVE:  Still having chest pain  VITAL SIGNS: Temp:  [97.8 F (36.6 C)-98.5 F (36.9 C)] 97.8 F (36.6 C) (06/20 0500) Pulse Rate:  [71-74] 74 (06/20 0500) Resp:  [18] 18 (06/20 0500) BP: (90-101)/(60-68) 101/65 mmHg (06/20 0500) SpO2:  [98 %-99 %] 99 % (06/20 0500) Weight:  [64.411 kg (142 lb)-65.3 kg (143 lb 15.4 oz)] 65.3 kg (143 lb 15.4 oz) (06/20 0500)   INTAKE / OUTPUT: Intake/Output     06/19 0701 - 06/20 0700 06/20 0701 - 06/21 0700   Urine (mL/kg/hr) 300    Total Output 300     Net -300            PHYSICAL EXAMINATION:  Gen: well appearing, no acute distress HEENT: NCAT, PERRL, EOMi, OP clear, neck supple without masses PULM: Poor air movement RUL, normal otherwise CV: RRR, no mgr, no JVD AB: BS+, soft, nontender, no hsm Ext: warm, no edema, no clubbing, no cyanosis Derm: no rash or skin breakdown Neuro: A&Ox4, CN II-XII intact, strength 5/5 in all 4 extremities   LABS:  Recent Labs Lab 01/05/13 1823 01/05/13 1830  HGB  --  11.8*  WBC  --  6.0  PLT  --  257  NA  --  139  K  --  4.4  CL  --  104  CO2  --  27  GLUCOSE  --  89  BUN  --  15  CREATININE  --  0.81  CALCIUM  --  9.5  TROPONINI <0.30  --    No results found for this basename: GLUCAP,  in the last 168 hours  CXR: Emphysema RUL EKG: NSR, No ST wave changes  ASSESSMENT / PLAN:  PULMONARY A: Matched ventilatory and perfusion deficit in the RUL  likely due to severe emphysema;  Elevated D-dimer without definitive evidence of pulmonary embolism at this point Modified wells criteria for PE is low Moderate COPD without exacerbation Ongoing tobacco abuse P:   -CT angio chest 6/20 after premedication for contrast dye allergy -heparin gtt until results of CT angio back -LE doppler ultrasound to look for DVT -d/c heparin if no PE -symbicort bid in hospital, resume dulera as outpatient  CARDIOVASCULAR A: Chest pain, etiology uncertain P:  -if CT angio chest negative, consider GI evaluation   GASTROINTESTINAL A:  GERD, s/p NISSEN P:   -outpatient GI evaluation/swallow evaluation  TODAY'S SUMMARY: 52 y/o female with upper lobe emphysema admitted for possible pulmonary embolism after intermediate prob V/Q scan.  Doubt PE, but reasonable to treat with heparin until CT angio chest back.  Needs pre-medications for CT angio.  May need outpatient GI evaluation if no PE.  Adella Hare Beeper  [phone]  Cell  [phone]  If no response or cell goes to voicemail, call beeper [phone]  Pulmonary and Critical Care Medicine [facility] Pager: [phone]  01/06/2013, 9:59 AM

## 2013-01-06 NOTE — Progress Notes (Signed)
Dr Delford Field asked if GI coudl do EGD with esophageal dilatation over the weekend.  Pt's chest pain, rule out PE workup is complete. CT angio negative, DVT study negative.  Now off Heparin.   Dr Loreta Ave is covering the GI service and the procedure is not emergent, so will not be done tomorrow.  I added Carafate slurry to the BID Protonix. Pt has EGD with dilatation with Propofol sedation set up for 7/2.  She is to keep this appt.    Jennye Moccasin PA-C  Elective EGD/possible dilation with Dr. Yancey Flemings as previously scheduled is appropriate.   Venita Lick. Russella Dar MD

## 2013-01-06 NOTE — Progress Notes (Signed)
*  PRELIMINARY RESULTS* Vascular Ultrasound Lower extremity venous duplex has been completed.  Preliminary findings: Negative for DVT.    Farrel Demark, RDMS, RVT  01/06/2013, 11:04 AM

## 2013-01-06 NOTE — Progress Notes (Signed)
PCCM   CT Angio Neg for PE.  DVT study neg.  Heparin stopped.  I will ask GI to see the pt in hosp.    If they wish to delay EGD til next week will plan d/c home this PM  [facility]

## 2013-01-06 NOTE — Discharge Summary (Signed)
Physician Discharge Summary  Patient ID: Stacy Fernandez MRN: [id number] DOB/AGE: 1961/04/27 52 y.o.  Admit date: 01/05/2013 Discharge date: 01/06/2013    Discharge Diagnoses:  Matched ventilatory and perfusion deficit in the RUL likely due to severe emphysema; neg CTA Chest. Moderate COPD without exacerbation  Ongoing tobacco abuse Chest Pain GERD Hx of Nissen Fundoplication  Hypothyroidism  Hyperlipidemia                                                                      DISCHARGE PLAN BY DIAGNOSIS     Matched ventilatory and perfusion deficit in the RUL likely due to severe emphysema; neg CTA Chest. Moderate COPD without exacerbation  Ongoing tobacco abuse Chest Pain - negative chest, thought likely related to GI stricture  Discharge Plan: -continue previous home dulera  -tobacco cessation -Toradol for pain    GERD Hx of Nissen Fundoplication   Discharge Plan: -plan for follow up with GI as outpatient for EGD and possible dilatation  -BID protonix -Carafate Q6    Hypothyroidism  Hyperlipidemia -No changes made during this admit to medications.                  DISCHARGE SUMMARY    Stacy Fernandez is a 52 y.o. y/o female with a PMH of COPD who was admitted on 6/19 for questionable non-massive pulmonary embolism.  She endorsed 6 weeks of chest pain when seen by Dr. Vassie Loll in the office on 6/17.  A cardiac catheterization had been performed earlier in June without evidence of obstructive CAD or LV dysfunction. She described the chest pain as substernal and radiating to the left chest. She had some "stiffness" and pain in the right chest as well. She underwent a cardiopulmonary stress test during which was stopped for chest pain after the patient only reached 80 watts (150 predicted) and VO2 max of 77% predicted.  After seeing Dr. Vassie Loll she had a d-dimer which was positive. A V/Q scan performed on 6/19 showed intermediate probability for PE so she was admitted and  started on heparin.  Post admission, she was pre-medicated in the setting of contrast media allergy and then CTA of chest was completed which ruled out pulmonary embolism.  She was seen by GI and felt her chest pain could be related to esophageal stricture.  She carries a hx of stricture post Nissen and has required dilation in past.               SIGNIFICANT DIAGNOSTIC STUDIES 6/19 VQ Scan>>intermediate probability PE 6/20 CTA Chest >>No evidence of pulmonary embolism. Severe emphysematous change, most severely involving the right upper lobe. Indeterminate punctate (<3 mm) nodules within the trachea and proximal aspect of the right main stem bronchus. Further evaluation with bronchoscopy may be performed as clinically indicated.   CONSULTS GI - Dr. Marina Goodell    Discharge Exam: Gen: well appearing, no acute distress  HEENT: NCAT, PERRL, EOMi, OP clear, neck supple without masses  PULM: Poor air movement RUL, normal otherwise  CV: RRR, no mgr, no JVD  AB: BS+, soft, nontender, no hsm  Ext: warm, no edema, no clubbing, no cyanosis  Derm: no rash or skin breakdown  Neuro: A&Ox4, CN II-XII intact, strength 5/5 in all  4 extremities   Filed Vitals:   01/05/13 2100 01/06/13 0500 01/06/13 1403 01/06/13 1500  BP: 101/60 101/65 103/48   Pulse: 73 74 95   Temp: 98 F (36.7 C) 97.8 F (36.6 C) 98.7 F (37.1 C)   TempSrc: Oral Oral Oral   Resp: 18 18 16    Height:    5' 6.5" (1.689 m)  Weight:  143 lb 15.4 oz (65.3 kg)  143 lb 15.4 oz (65.3 kg)  SpO2: 99% 99% 97%      Discharge Labs  BMET  Recent Labs Lab 01/05/13 1830  NA 139  K 4.4  CL 104  CO2 27  GLUCOSE 89  BUN 15  CREATININE 0.81  CALCIUM 9.5    CBC  Recent Labs Lab 01/05/13 1830  HGB 11.8*  HCT 33.8*  WBC 6.0  PLT 257        Discharge Orders   Future Appointments Provider Department Dept Phone   01/10/2013 9:45 AM Oretha Milch, MD [facility] [place] [phone]   01/18/2013 3:30 PM Hilarie Fredrickson, MD  [facility] [phone]   Future Orders Complete By Expires     Call MD for:  difficulty breathing, headache or visual disturbances  As directed     Call MD for:  persistant dizziness or light-headedness  As directed     Call MD for:  persistant nausea and vomiting  As directed     Call MD for:  severe uncontrolled pain  As directed     Diet - low sodium heart healthy  As directed     Increase activity slowly  As directed          Medication List    TAKE these medications       aspirin EC 81 MG tablet  Take 1 tablet (81 mg total) by mouth daily.     atorvastatin 40 MG tablet  Commonly known as:  LIPITOR  Take 1 tablet (40 mg total) by mouth daily at 6 PM.     DULERA 100-5 MCG/ACT Aero  Generic drug:  mometasone-formoterol  Inhale 2 puffs into the lungs 2 (two) times daily.     gabapentin 100 MG capsule  Commonly known as:  NEURONTIN  Take 200 mg by mouth at bedtime.     HYDROcodone-acetaminophen 5-325 MG per tablet  Commonly known as:  NORCO/VICODIN  Take 1-2 tablets by mouth every 6 (six) hours as needed for pain.     ketorolac 10 MG tablet  Commonly known as:  TORADOL  Take 1 tablet (10 mg total) by mouth every 6 (six) hours as needed (headache).     levothyroxine 75 MCG tablet  Commonly known as:  SYNTHROID, LEVOTHROID  Take 75 mcg by mouth daily before breakfast.     meclizine 25 MG tablet  Commonly known as:  ANTIVERT  Take 25 mg by mouth 3 (three) times daily as needed.     nicotine 14 mg/24hr patch  Commonly known as:  NICODERM CQ - dosed in mg/24 hours  Place 1 patch onto the skin daily.     nitroGLYCERIN 0.4 MG SL tablet  Commonly known as:  NITROSTAT  Place 0.4 mg under the tongue every 5 (five) minutes as needed for chest pain.     pantoprazole 40 MG tablet  Commonly known as:  PROTONIX  Take 40 mg by mouth 2 (two) times daily.     propranolol 40 MG tablet  Commonly known as:  INDERAL  Take 1 tablet (40 mg  total) by mouth 2 (two) times daily.     sucralfate 1 GM/10ML suspension  Commonly known as:  CARAFATE  Take 10 mLs (1 g total) by mouth every 6 (six) hours.     venlafaxine XR 150 MG 24 hr capsule  Commonly known as:  EFFEXOR-XR  Take 150 mg by mouth daily.          Disposition: Discharge to Home.   Discharged Condition: Stacy Fernandez has met maximum benefit of inpatient care and is medically stable and cleared for discharge.  Patient is pending follow up as above.      Time spent on disposition:  Greater than 35 minutes.   Signed: Canary Brim, NP-C  Pulmonary & Critical Care Pgr: [phone] Office: [phone]    I have seen and examined this patient and agree with this discharge plan Caryl Bis  [phone]  Cell  [phone]  If no response or cell goes to voicemail, call beeper [phone]

## 2013-01-09 ENCOUNTER — Telehealth: Payer: Self-pay | Admitting: Internal Medicine

## 2013-01-09 NOTE — Telephone Encounter (Signed)
Spoke with pt and she is aware.

## 2013-01-09 NOTE — Telephone Encounter (Signed)
Pt was in the hospital and discharged Friday. Pt is currently scheduled for an EGD with Dr. Marina Goodell 01/18/13. Pt states she was told to call the office and see if she could be worked in sooner for the procedure. Reviewed hospital notes and did not see anything stating this. Dr. Marina Goodell do you want to leave pt scheduled 01/18/13? Please advise.

## 2013-01-09 NOTE — Telephone Encounter (Signed)
I reviewed chart as well. I appreciate her call. Keep on schedule as is. Thanks

## 2013-01-09 NOTE — Progress Notes (Signed)
Agree with initial assessment and plans as outlined 

## 2013-01-10 ENCOUNTER — Encounter: Payer: Self-pay | Admitting: *Deleted

## 2013-01-10 ENCOUNTER — Ambulatory Visit (INDEPENDENT_AMBULATORY_CARE_PROVIDER_SITE_OTHER): Payer: BC Managed Care – PPO | Admitting: Pulmonary Disease

## 2013-01-10 ENCOUNTER — Encounter: Payer: Self-pay | Admitting: Pulmonary Disease

## 2013-01-10 VITALS — BP 100/62 | HR 67 | Temp 98.0°F | Ht 66.5 in | Wt 143.0 lb

## 2013-01-10 DIAGNOSIS — J449 Chronic obstructive pulmonary disease, unspecified: Secondary | ICD-10-CM

## 2013-01-10 DIAGNOSIS — R079 Chest pain, unspecified: Secondary | ICD-10-CM

## 2013-01-10 MED ORDER — NICOTINE 21 MG/24HR TD PT24: 1.0000 | MEDICATED_PATCH | TRANSDERMAL | Status: DC

## 2013-01-10 NOTE — Assessment & Plan Note (Signed)
Appears to be esophageal origin - by exlcusion EGD planned for 7/2 - may need manometry studies if dysmotility Letter for work given until 7/7

## 2013-01-10 NOTE — Assessment & Plan Note (Addendum)
Smoking cessation -most important intervention here Unclear what tracheal lesions represent -? Papillomas - schedule bscopy to assess - discussed risks & benefits This is not cause of pain

## 2013-01-10 NOTE — Patient Instructions (Addendum)
Bronchoscopy scheduled for Monday 01/16/13 at 0800 - we discussed risks  Smoking cessation Note for work until July 7- Further notes from GI & Dr Collins [place]

## 2013-01-10 NOTE — Progress Notes (Signed)
  Subjective:    Patient ID: Stacy Fernandez, female    DOB: 23-Aug-1960, 52 y.o.   MRN: [id number]  HPI  PCP- Tammy Spears  52 year old heavy smoker referred for evaluation of chest pain.  She reports intermittent substernal chest pain radiating to left shoulder for the last 6 weeks. This is not related to exertion or meals or body position. She reports associated lightheadedness and diaphoresis. She was hospitalized from 6/8-6/10/14, cardiac Showed essentially normal coronary arteries, LVEF was estimated at 60%, troponins were negative.  Of note, she has an allergy listed as anaphylaxis to contrast dye and was premedicated prior to the procedure.  Nitroglycerin does not provide any relief, he uses propranolol for migraine prophylaxis and Protonix twice a day. She is a remote history of Nissen's fundoplication in 2001 but denies obvious heartburn  She underwent cardiopulmonary stress testing in 12/13/12 showed mildly reduced functional status with O2 of 76% predicted. The test was stopped due to chest pain after 8 minutes. There was cardiovascular limitation to exercise with a peak heart rate of 169 and slightly decreased stroke output with increasing heart rate. His is no ventilatory limitation to exercise, but there is mildly increased space  Spirometry showed moderate airway obstruction with FEV1 of 58% FVC of 71% and ratio 65, diffusion was 38%  EKG was normal and chest x-ray showed hyperinflation without any infiltrates.  She also reports chronic headaches for which she sees Dr. Anne Hahn  She is currently on medical leave, he is a heavy smoker about one pack per day since her teenage years and has now cut down to half pack per day using an electronic cigarette  She did not desaturate on walking 3 laps around the office today  01/10/2013 Admitted for chest pain, intermed prob VQ scan, CT angio was neg (after premedication)-severe centrilobular and paraseptal emphysema noted, Indeterminate punctate (<3  mm) nodules within the trachea and  proximal aspect of the right main stem bronchus   Pt still has some CP but not as worse. She stated she has notice it after she eats something. the liquid coated carafate has helped. She is scheduled for  EGD 01/18/13. Pt is needing work until her EGD is done.  Review of Systems neg for any significant sore throat, dysphagia, itching, sneezing, nasal congestion or excess/ purulent secretions, fever, chills, sweats, unintended wt loss, pleuritic or exertional cp, hempoptysis, orthopnea pnd or change in chronic leg swelling. Also denies presyncope, palpitations, heartburn, abdominal pain, nausea, vomiting, diarrhea or change in bowel or urinary habits, dysuria,hematuria, rash, arthralgias, visual complaints, headache, numbness weakness or ataxia.     Objective:   Physical Exam  Gen. Pleasant, well-nourished, in no distress ENT - no lesions, no post nasal drip Neck: No JVD, no thyromegaly, no carotid bruits Lungs: no use of accessory muscles, no dullness to percussion, clear without rales or rhonchi  Cardiovascular: Rhythm regular, heart sounds  normal, no murmurs or gallops, no peripheral edema Musculoskeletal: No deformities, no cyanosis or clubbing        Assessment & Plan:

## 2013-01-13 ENCOUNTER — Encounter (HOSPITAL_COMMUNITY): Payer: Self-pay

## 2013-01-16 ENCOUNTER — Ambulatory Visit (HOSPITAL_COMMUNITY)
Admission: RE | Admit: 2013-01-16 | Discharge: 2013-01-16 | Disposition: A | Discharge status: Home / Self Care | Payer: BC Managed Care – PPO | Source: Ambulatory Visit | Attending: Pulmonary Disease | Admitting: Pulmonary Disease

## 2013-01-16 ENCOUNTER — Encounter (HOSPITAL_COMMUNITY): Payer: Self-pay | Admitting: Respiratory Therapy

## 2013-01-16 ENCOUNTER — Encounter (HOSPITAL_COMMUNITY)
Admission: RE | Disposition: A | Discharge status: Home / Self Care | Payer: Self-pay | Source: Ambulatory Visit | Attending: Pulmonary Disease

## 2013-01-16 DIAGNOSIS — J398 Other specified diseases of upper respiratory tract: Secondary | ICD-10-CM | POA: Insufficient documentation

## 2013-01-16 DIAGNOSIS — F17200 Nicotine dependence, unspecified, uncomplicated: Secondary | ICD-10-CM | POA: Insufficient documentation

## 2013-01-16 DIAGNOSIS — J988 Other specified respiratory disorders: Secondary | ICD-10-CM

## 2013-01-16 DIAGNOSIS — R079 Chest pain, unspecified: Secondary | ICD-10-CM | POA: Insufficient documentation

## 2013-01-16 DIAGNOSIS — Z91041 Radiographic dye allergy status: Secondary | ICD-10-CM | POA: Insufficient documentation

## 2013-01-16 HISTORY — PX: VIDEO BRONCHOSCOPY: SHX5072

## 2013-01-16 SURGERY — VIDEO BRONCHOSCOPY WITHOUT FLUORO
Anesthesia: Moderate Sedation | Laterality: Bilateral

## 2013-01-16 MED ORDER — FENTANYL CITRATE 0.05 MG/ML IJ SOLN
INTRAMUSCULAR | Status: AC
  Filled 2013-01-16: qty 4

## 2013-01-16 MED ORDER — LIDOCAINE HCL 2 % EX GEL
CUTANEOUS | Status: DC | PRN
  Administered 2013-01-16: 1

## 2013-01-16 MED ORDER — PHENYLEPHRINE HCL 0.25 % NA SOLN
NASAL | Status: DC | PRN
  Administered 2013-01-16: 1 via NASAL

## 2013-01-16 MED ORDER — MIDAZOLAM HCL 10 MG/2ML IJ SOLN
INTRAMUSCULAR | Status: DC | PRN
  Administered 2013-01-16 (×3): 1 mg via INTRAVENOUS

## 2013-01-16 MED ORDER — LIDOCAINE HCL 1 % IJ SOLN
INTRAMUSCULAR | Status: DC | PRN
  Administered 2013-01-16: 6 mL via RESPIRATORY_TRACT

## 2013-01-16 MED ORDER — MIDAZOLAM HCL 10 MG/2ML IJ SOLN
INTRAMUSCULAR | Status: AC
  Filled 2013-01-16: qty 4

## 2013-01-16 MED ORDER — SODIUM CHLORIDE 0.9 % IV SOLN
INTRAVENOUS | Status: DC
  Administered 2013-01-16: 07:00:00 via INTRAVENOUS

## 2013-01-16 MED ORDER — FENTANYL CITRATE 0.05 MG/ML IJ SOLN
INTRAMUSCULAR | Status: DC | PRN
  Administered 2013-01-16 (×3): 25 ug via INTRAVENOUS

## 2013-01-16 NOTE — H&P (View-Only) (Signed)
  Subjective:    Patient ID: Stacy Fernandez, female    DOB: Jan 09, 1961, 52 y.o.   MRN: [id number]  HPI  PCP- Tammy Spears  52 year old heavy smoker referred for evaluation of chest pain.  She reports intermittent substernal chest pain radiating to left shoulder for the last 6 weeks. This is not related to exertion or meals or body position. She reports associated lightheadedness and diaphoresis. She was hospitalized from 6/8-6/10/14, cardiac Showed essentially normal coronary arteries, LVEF was estimated at 60%, troponins were negative.  Of note, she has an allergy listed as anaphylaxis to contrast dye and was premedicated prior to the procedure.  Nitroglycerin does not provide any relief, he uses propranolol for migraine prophylaxis and Protonix twice a day. She is a remote history of Nissen's fundoplication in 2001 but denies obvious heartburn  She underwent cardiopulmonary stress testing in 12/13/12 showed mildly reduced functional status with O2 of 76% predicted. The test was stopped due to chest pain after 8 minutes. There was cardiovascular limitation to exercise with a peak heart rate of 169 and slightly decreased stroke output with increasing heart rate. His is no ventilatory limitation to exercise, but there is mildly increased space  Spirometry showed moderate airway obstruction with FEV1 of 58% FVC of 71% and ratio 65, diffusion was 38%  EKG was normal and chest x-ray showed hyperinflation without any infiltrates.  She also reports chronic headaches for which she sees Dr. Anne Hahn  She is currently on medical leave, he is a heavy smoker about one pack per day since her teenage years and has now cut down to half pack per day using an electronic cigarette  She did not desaturate on walking 3 laps around the office today  01/10/2013 Admitted for chest pain, intermed prob VQ scan, CT angio was neg (after premedication)-severe centrilobular and paraseptal emphysema noted, Indeterminate punctate (<3  mm) nodules within the trachea and  proximal aspect of the right main stem bronchus   Pt still has some CP but not as worse. She stated she has notice it after she eats something. the liquid coated carafate has helped. She is scheduled for  EGD 01/18/13. Pt is needing work until her EGD is done.  Review of Systems neg for any significant sore throat, dysphagia, itching, sneezing, nasal congestion or excess/ purulent secretions, fever, chills, sweats, unintended wt loss, pleuritic or exertional cp, hempoptysis, orthopnea pnd or change in chronic leg swelling. Also denies presyncope, palpitations, heartburn, abdominal pain, nausea, vomiting, diarrhea or change in bowel or urinary habits, dysuria,hematuria, rash, arthralgias, visual complaints, headache, numbness weakness or ataxia.     Objective:   Physical Exam  Gen. Pleasant, well-nourished, in no distress ENT - no lesions, no post nasal drip Neck: No JVD, no thyromegaly, no carotid bruits Lungs: no use of accessory muscles, no dullness to percussion, clear without rales or rhonchi  Cardiovascular: Rhythm regular, heart sounds  normal, no murmurs or gallops, no peripheral edema Musculoskeletal: No deformities, no cyanosis or clubbing        Assessment & Plan:

## 2013-01-16 NOTE — Op Note (Signed)
Indication:  Indeterminate punctate (<3 mm) nodules within the trachea and  proximal aspect of the right main stem bronchus seen on Ct chest in the 52 -year-old smoker with chest pain  Written informed consent was obtained from the patient prior to the procedure. The risks of the procedure including coughing, bleeding and a small chance of lung cancer requiring a chest tube were discussed with the patient in great detail and evidenced understanding.  3 mg of Versed and  of fentanyl were used in divided doses during the procedure. Bronchoscope was inserted from the right Nare. The upper airway appeared normal. Vocal cord showed normal appearance in motion. The trachea bronchial tree was then examined to the subsegmental level. Mucosa had an inflamed appearance. Minimal secretions were noted. Nodular appearing whitich sputum noted in the right main stem which washed away with saline. Similar tag noted in the posterior wall of trachea which was brushed.   Bronchoalveolar lavage was obtained from the right lower lobe with good return.  The patient tolerated procedure well with minimal bleeding. There was some nasal bleeding.  She was awake and alert in the end of the procedure.

## 2013-01-16 NOTE — Progress Notes (Signed)
Video Bronchoscopy Done  Intervention bronchial brushing done  Procedure tolerated well

## 2013-01-16 NOTE — Interval H&P Note (Signed)
History and Physical Interval Note:  01/16/2013 7:20 AM  Stacy Fernandez  has presented today for surgery, with the diagnosis of RUL perfusion  The various methods of treatment have been discussed with the patient and family. After consideration of risks, benefits and other options for treatment, the patient has consented to  Procedure(s): VIDEO BRONCHOSCOPY WITHOUT FLUORO (Bilateral) as a surgical intervention .  The patient's history has been reviewed, patient examined, no change in status, stable for surgery.  I have reviewed the patient's chart and labs.  Questions were answered to the patient's satisfaction.     Casper Pagliuca V.

## 2013-01-17 ENCOUNTER — Encounter (HOSPITAL_COMMUNITY): Payer: Self-pay | Admitting: Pulmonary Disease

## 2013-01-17 ENCOUNTER — Other Ambulatory Visit: Payer: Self-pay

## 2013-01-17 MED ORDER — PANTOPRAZOLE SODIUM 40 MG PO TBEC: 40.0000 mg | DELAYED_RELEASE_TABLET | Freq: Two times a day (BID) | ORAL | Status: DC

## 2013-01-17 NOTE — Telephone Encounter (Signed)
Rx was sent to pharmacy electronically. 

## 2013-01-18 ENCOUNTER — Other Ambulatory Visit: Payer: Self-pay

## 2013-01-18 ENCOUNTER — Ambulatory Visit (AMBULATORY_SURGERY_CENTER): Payer: BC Managed Care – PPO | Admitting: Internal Medicine

## 2013-01-18 ENCOUNTER — Encounter: Payer: Self-pay | Admitting: Internal Medicine

## 2013-01-18 VITALS — BP 119/76 | HR 73 | Temp 97.0°F | Resp 20 | Ht 66.0 in | Wt 142.0 lb

## 2013-01-18 DIAGNOSIS — R1310 Dysphagia, unspecified: Secondary | ICD-10-CM

## 2013-01-18 DIAGNOSIS — R079 Chest pain, unspecified: Secondary | ICD-10-CM

## 2013-01-18 HISTORY — PX: ESOPHAGOGASTRODUODENOSCOPY (EGD) WITH PROPOFOL: SHX5813

## 2013-01-18 LAB — CULTURE, BAL-QUANTITATIVE W GRAM STAIN

## 2013-01-18 MED ORDER — PROPRANOLOL HCL 40 MG PO TABS: 40.0000 mg | ORAL_TABLET | Freq: Two times a day (BID) | ORAL | Status: DC

## 2013-01-18 MED ORDER — SODIUM CHLORIDE 0.9 % IV SOLN: 500.0000 mL | INTRAVENOUS | Status: DC

## 2013-01-18 MED ORDER — VENLAFAXINE HCL ER 150 MG PO CP24: 150.0000 mg | ORAL_CAPSULE | Freq: Every day | ORAL | Status: DC

## 2013-01-18 NOTE — Progress Notes (Signed)
Patient did not have preoperative order for IV antibiotic SSI prophylaxis. (G8918)   

## 2013-01-18 NOTE — Progress Notes (Signed)
Brennan Bailey, CRNA walked back into the recovery room and asked Weston Brass, RN how the pt was doing.  She said her cough had subsided. Maw

## 2013-01-18 NOTE — Patient Instructions (Addendum)
Stop smoking packet given to pt.  Return to your care of your primary care physician.  You may resume other medications today.  Please call if any questions or concerns.    YOU HAD AN ENDOSCOPIC PROCEDURE TODAY AT THE [facility]: Refer to the procedure report that was given to you for any specific questions about what was found during the examination.  If the procedure report does not answer your questions, please call your gastroenterologist to clarify.  If you requested that your care partner not be given the details of your procedure findings, then the procedure report has been included in a sealed envelope for you to review at your convenience later.  YOU SHOULD EXPECT: Some feelings of bloating in the abdomen. Passage of more gas than usual.  Walking can help get rid of the air that was put into your GI tract during the procedure and reduce the bloating. If you had a lower endoscopy (such as a colonoscopy or flexible sigmoidoscopy) you may notice spotting of blood in your stool or on the toilet paper. If you underwent a bowel prep for your procedure, then you may not have a normal bowel movement for a few days.  DIET: Your first meal following the procedure should be a light meal and then it is ok to progress to your normal diet.  A half-sandwich or bowl of soup is an example of a good first meal.  Heavy or fried foods are harder to digest and may make you feel nauseous or bloated.  Likewise meals heavy in dairy and vegetables can cause extra gas to form and this can also increase the bloating.  Drink plenty of fluids but you should avoid alcoholic beverages for 24 hours.  ACTIVITY: Your care partner should take you home directly after the procedure.  You should plan to take it easy, moving slowly for the rest of the day.  You can resume normal activity the day after the procedure however you should NOT DRIVE or use heavy machinery for 24 hours (because of the sedation medicines used  during the test).    SYMPTOMS TO REPORT IMMEDIATELY: A gastroenterologist can be reached at any hour.  During normal business hours, 8:30 AM to 5:00 PM Monday through Friday, call [phone].  After hours and on weekends, please call the GI answering service at [phone] who will take a message and have the physician on call contact you.     Following upper endoscopy (EGD)  Vomiting of blood or coffee ground material  New chest pain or pain under the shoulder blades  Painful or persistently difficult swallowing  New shortness of breath  Fever of 100F or higher  Black, tarry-looking stools  FOLLOW UP: If any biopsies were taken you will be contacted by phone or by letter within the next 1-3 weeks.  Call your gastroenterologist if you have not heard about the biopsies in 3 weeks.  Our staff will call the home number listed on your records the next business day following your procedure to check on you and address any questions or concerns that you may have at that time regarding the information given to you following your procedure. This is a courtesy call and so if there is no answer at the home number and we have not heard from you through the emergency physician on call, we will assume that you have returned to your regular daily activities without incident.  SIGNATURES/CONFIDENTIALITY: You and/or your care partner have signed  paperwork which will be entered into your electronic medical record.  These signatures attest to the fact that that the information above on your After Visit Summary has been reviewed and is understood.  Full responsibility of the confidentiality of this discharge information lies with you and/or your care-partner.

## 2013-01-18 NOTE — Progress Notes (Signed)
Pt's coughing spell subsided.  O2 sats are above 92%. Pt sitting in high fowlers position and encouraged her to take slow, deep breaths.  She is doing so. Maw

## 2013-01-18 NOTE — Progress Notes (Signed)
Pt wet her underwear and pants from urination.  We cleaned her with baby wipes and pt wore 2 gowns home with a depend.  She agreed to this.  No complaints noted at discharge.  She thanked everyone for giving her great care.  Maw

## 2013-01-18 NOTE — Progress Notes (Signed)
Weston Brass, RN gave the pt a stop smoking packet.  She accepted it. Maw

## 2013-01-18 NOTE — Progress Notes (Signed)
Pt has her dulera inhaler with her today.  She is coughing repeatedly.  She did cough up small amount of clear colored mucous.  She points to her throat and I asked if it felt like it was a tickle in her throat.  Pt nodded yes.  Dr. Marina Goodell in to speak with the pt and her family.   Weston Brass, RN asked Brennan Bailey, CRNA to come in to see the pt.  He asked from [facility], RN's office if she wanted the pt to use her [facility] inhaler, and Clydie Braun said yes.  He replied from [place] office yes she may use her in haler.   Maw   4:18 the pt used her dulera inhaler 2 puffs. maw

## 2013-01-18 NOTE — Progress Notes (Signed)
Lidocaine-40mg IV prior to Propofol InductionPropofol given over incremental dosages 

## 2013-01-18 NOTE — Progress Notes (Signed)
Tympanic temperature taken 98.0, VSS while pt has been in the recovery room. Maw

## 2013-01-18 NOTE — Op Note (Signed)
[facility] [address] [place], [address]   ENDOSCOPY PROCEDURE REPORT  PATIENT: Stacy Fernandez, Stacy Fernandez  MR#: [id number] BIRTHDATE: 1961/03/31 , 52  yrs. old GENDER: Female ENDOSCOPIST: Roxy Cedar, MD REFERRED BY:  Oretha Milch, M.D. PROCEDURE DATE:  01/18/2013 PROCEDURE:  EGD, diagnostic ASA CLASS:     Class II INDICATIONS:  Chest pain, constant. Negative cardiac and pulmonary evaluations. Recent GI office visit. No response to PPI.  Dysphagia (solid and liquid - mild / infrequent) MEDICATIONS: MAC sedation, administered by CRNA and propofol (Diprivan) 100mg  IV TOPICAL ANESTHETIC: none  DESCRIPTION OF PROCEDURE: After the risks benefits and alternatives of the procedure were thoroughly explained, informed consent was obtained.  The LB [id number] endoscope was introduced through the mouth and advanced to the second portion of the duodenum. Without limitations.  The instrument was slowly withdrawn as the mucosa was fully examined.     EXAM: The upper, middle and distal third of the esophagus were carefully inspected and no abnormalities were noted.  The z-line was well seen at the GEJ.  The endoscope was pushed into the fundus which was normal including a retroflexed view.  The antrum, gastric body, first and second part of the duodenum were unremarkable. Retroflexed views revealed no abnormalities with prior [employer] wrap noted.     The scope was then withdrawn from the patient and the procedure completed.  COMPLICATIONS: There were no complications. ENDOSCOPIC IMPRESSION: 1. Normal EGD post fundoplication 2. Chest pain. No GI cause found or suspected  RECOMMENDATIONS: 1. Stop smoking 2. Reurn to the care of your PCP  REPEAT EXAM:  eSigned:  Roxy Cedar, MD 01/18/2013 4:05 PM   FA:[id number] Wallace Keller, MD, Herb Grays, MD, and The Patient

## 2013-01-18 NOTE — Progress Notes (Signed)
Patient did not have preoperative order for IV antibiotic SSI prophylaxis. (G8918)  Patient did not experience any of the following events: a burn prior to discharge; a fall within the facility; wrong site/side/patient/procedure/implant event; or a hospital transfer or hospital admission upon discharge from the facility. (G8907)  

## 2013-01-19 ENCOUNTER — Telehealth: Payer: Self-pay

## 2013-01-19 NOTE — Telephone Encounter (Signed)
  Follow up Call-  Call back number 01/18/2013  Post procedure Call Back phone  # [phone]  Permission to leave phone message Yes     Patient questions:  Do you have a fever, pain , or abdominal swelling? no Pain Score  0 *  Have you tolerated food without any problems? yes  Have you been able to return to your normal activities? yes  Do you have any questions about your discharge instructions: Diet   no Medications  no Follow up visit  no  Do you have questions or concerns about your Care? no  Actions: * If pain score is 4 or above: No action needed, pain <4.

## 2013-01-23 ENCOUNTER — Encounter: Payer: Self-pay | Admitting: Pulmonary Disease

## 2013-01-23 ENCOUNTER — Telehealth: Payer: Self-pay | Admitting: Pulmonary Disease

## 2013-01-23 NOTE — Telephone Encounter (Signed)
Spoke with patient,reqesting biopsy results Notes Recorded by Oretha Milch, MD on 01/19/2013 at 3:15 PM bscopy results neg      Results given as listed above, however patient wants to know exactly what was found Dr. Vassie Loll please advise thank you

## 2013-01-24 NOTE — Telephone Encounter (Signed)
No evidence of bacteria, mold or cancer

## 2013-01-24 NOTE — Telephone Encounter (Signed)
Pt was made aware this AM of this. Will sign message

## 2013-01-31 ENCOUNTER — Telehealth: Payer: Self-pay | Admitting: Pulmonary Disease

## 2013-01-31 NOTE — Telephone Encounter (Signed)
Pl have her see TP or other MD tomorrow

## 2013-01-31 NOTE — Telephone Encounter (Signed)
Patient Information:  Caller Name: Stacy Fernandez  Phone: [phone]  Patient: Stacy Fernandez, Stacy Fernandez  Gender: Female  DOB: 30-Apr-1961  Age: 52 Years  PCP: Stacy Fernandez (Adults only)  Pregnant: No  Office Follow Up:  Does the office need to follow up with this patient?: Yes  Instructions For The Office: She is waiting for direction from Stacy Fernandez in regards to MODERATE SOB and refused ER, which RN/CAN advised.  RN Note:  She called earlier stating she had increase SOB, cough w/ brown phlem, slight wheezing, rib cage sore from all the coughing, "I am fighting to breath".Pt went to PCP last week and was giving Augmentin (now finished), proair (doing 8-10 puffs daily), and cough syrup. Pt stated this has been going on since the 2 procedures she had done in the same week. Stacy Fernandez still have not called back and looking for direction. She considers herself in the MODERATE category for SOB. "Stacy Fernandez" from the office will ask Stacy Fernandez for direction and will call back Stacy Fernandez. RN/CAN advised Stacy Fernandez to dial 911 if any respiratory distress.  Symptoms  Reason For Call & Symptoms: cough, wheezing, SOB, diagnosed with Emphysema  Reviewed Health History In EMR: Yes  Reviewed Medications In EMR: Yes  Reviewed Allergies In EMR: Yes  Reviewed Surgeries / Procedures: Yes  Date of Onset of Symptoms: 01/31/2013  Treatments Tried: Codeine syrup and inhaler  Treatments Tried Worked: No OB / GYN:  LMP: Unknown  Guideline(s) Used:  Breathing Difficulty  Disposition Per Guideline:   Go to ED Now  Reason For Disposition Reached:   Moderate difficulty breathing (e.g., speaks in phrases, SOB even at rest, pulse 100-120) of new onset or worse than normal  Advice Given:  General Care Advice for Breathing Difficulty:  Find position of greatest comfort. For most patients the best position is semi-upright (e.g., sitting up in a comfortable chair or lying back against pillows).  Elevate head of bed (e.g., use pillows  or place blocks under bed).  Avoid smoke or fume exposure.  Limit activities or space activities apart during the day. Prioritize activities.  Use a humidifier.  Call Back If:  Severe difficulty breathing occurs  You become worse.  RN Overrode Recommendation:  Follow Up With Office Later  "Stacy Fernandez" from the office is aware RN/CAN has an ER disposition due to moderate breathing issues, same as she noted earlier on her note. She states she will talk to Stacy Fernandez now and will call back Stacy Fernandez  herself. Stacy Fernandez aware to dial 911 with any distress.

## 2013-01-31 NOTE — Telephone Encounter (Signed)
I spoke with pt. She is scheduled to see KC in the AM at 9:15 at the [facility] office. She is aware of the location to the [facility] office. She needed nothing further

## 2013-01-31 NOTE — Telephone Encounter (Signed)
I spoke with pt. She c/o increase SOB, cough w/ brown phlem, slight wheezing, rib cage sore from all the coughing, chest sore from coughing. Pt went to PCP last week and was giving augmentin (now finished), proair (doing 8-10 puffs daily), and hycodan cough syrup. Pt stated this has been going on since the 2 procedures she had done in the same week. Pt is requesting recs. Please advise Dr. Vassie Loll thanks  Allergies  Allergen Reactions  . Contrast Media (Iodinated Diagnostic Agents)     Heart stops and breathing stops

## 2013-01-31 NOTE — Telephone Encounter (Signed)
appt made to see dr Shelle Iron 7/16

## 2013-02-01 ENCOUNTER — Ambulatory Visit (INDEPENDENT_AMBULATORY_CARE_PROVIDER_SITE_OTHER)
Admission: RE | Admit: 2013-02-01 | Discharge: 2013-02-01 | Disposition: A | Discharge status: Home / Self Care | Payer: BC Managed Care – PPO | Source: Ambulatory Visit | Attending: Pulmonary Disease | Admitting: Pulmonary Disease

## 2013-02-01 ENCOUNTER — Encounter: Payer: Self-pay | Admitting: Pulmonary Disease

## 2013-02-01 ENCOUNTER — Telehealth: Payer: Self-pay | Admitting: Pulmonary Disease

## 2013-02-01 ENCOUNTER — Ambulatory Visit (INDEPENDENT_AMBULATORY_CARE_PROVIDER_SITE_OTHER): Payer: BC Managed Care – PPO | Admitting: Pulmonary Disease

## 2013-02-01 VITALS — BP 120/80 | HR 74 | Temp 97.6°F | Ht 66.0 in | Wt 144.6 lb

## 2013-02-01 DIAGNOSIS — Z720 Tobacco use: Secondary | ICD-10-CM

## 2013-02-01 DIAGNOSIS — J441 Chronic obstructive pulmonary disease with (acute) exacerbation: Secondary | ICD-10-CM

## 2013-02-01 DIAGNOSIS — F17200 Nicotine dependence, unspecified, uncomplicated: Secondary | ICD-10-CM

## 2013-02-01 MED ORDER — LEVOFLOXACIN 750 MG PO TABS: 750.0000 mg | ORAL_TABLET | Freq: Every day | ORAL | Status: DC

## 2013-02-01 MED ORDER — METHYLPREDNISOLONE 16 MG PO TABS: ORAL_TABLET | ORAL | Status: DC

## 2013-02-01 NOTE — Telephone Encounter (Signed)
ATC patient-- no answer msg on machine that this has been taken care of  OV note from today has been faxed to provided number  Nothing further needed

## 2013-02-01 NOTE — Progress Notes (Signed)
  Subjective:    Patient ID: Stacy Fernandez, female    DOB: October 22, 1960, 52 y.o.   MRN: [id number]  HPI The patient comes in today for an acute sick visit.  She is normally followed by Dr. Vassie Loll with mild COPD, as well as ongoing tobacco abuse.  She recently had bronchoscopy as well as upper endoscopy, and states 2 weeks ago began to have increasing cough with congestion, as well as purulent mucus.  She also developed worsening shortness of breath.  She has been treated by her primary doctor with Augmentin, but has continued to bring up purulent mucus.  She also feels that her breathing is worsening with exertion and at rest.  Because of all of her coughing, she has developed left-sided rib discomfort.  She has a cough medicine from her primary physician.  She denies any hemoptysis or chest pressure.  Unfortunately she has continued to smoke.  She is supposed to be on dulera for maintenance, but ran out of her sample inhaler   Review of Systems  Constitutional: Negative for fever and unexpected weight change.  HENT: Negative for ear pain, nosebleeds, congestion, sore throat, rhinorrhea, sneezing, trouble swallowing, dental problem, postnasal drip and sinus pressure.   Eyes: Negative for redness and itching.  Respiratory: Positive for cough, chest tightness, shortness of breath and wheezing.   Cardiovascular: Positive for chest pain ( rib pain from SOB and cough). Negative for palpitations and leg swelling.  Gastrointestinal: Negative for nausea and vomiting.  Genitourinary: Negative for dysuria.  Musculoskeletal: Negative for joint swelling.  Skin: Negative for rash.  Neurological: Negative for headaches.  Hematological: Does not bruise/bleed easily.  Psychiatric/Behavioral: Negative for dysphoric mood. The patient is not nervous/anxious.        Objective:   Physical Exam Well-developed female in no acute distress Nose without purulence or discharge noted Oropharynx clear Neck is supple  without lymphadenopathy or thyromegaly Chest with decreased breath sounds, and a few rhonchi.  No active wheezing or crackles Cardiac exam with regular rate and rhythm Lower extremities without edema, no cyanosis Alert and oriented, moves all 4 extremities.       Assessment & Plan:

## 2013-02-01 NOTE — Telephone Encounter (Signed)
I spoke with lauren and will forward to her. She knows what pt is talking about.

## 2013-02-01 NOTE — Patient Instructions (Addendum)
Will treat with levaquin 750mg  one a day for 5 days Will treat with a short course of medrol to help with airway inflammation Will check chest xray today, and call you with results. Smoking cessation is the most important treatment for you at this point.  Start back on dulera 100/5  2 inhalations am and pm everyday.  Rinse mouth well.

## 2013-02-01 NOTE — Assessment & Plan Note (Addendum)
The patient's history is most consistent with a COPD exacerbation.  She may have an acute bronchitis that did not respond to Augmentin, and we'll therefore brought in her coverage to Levaquin and treat her with a course of steroids.  She believes that she is intolerant to prednisone, and we'll therefore use Medrol.  I will also get her back on her maintenance inhaler, and check a chest x-ray for completeness.  Finally, I stressed to her importance of total smoking cessation, and that she will not improve until she is able to quit.  She is currently using a nicotine patch, and may have to consider Chantix.  Finally, I noted that she is on propranolol, and this may need to be changed to a more selective beta blocker.  She will discuss this with her neurologist.

## 2013-02-15 LAB — FUNGUS CULTURE W SMEAR

## 2013-02-16 ENCOUNTER — Ambulatory Visit (INDEPENDENT_AMBULATORY_CARE_PROVIDER_SITE_OTHER): Payer: BC Managed Care – PPO | Admitting: Neurology

## 2013-02-16 ENCOUNTER — Encounter: Payer: Self-pay | Admitting: Neurology

## 2013-02-16 VITALS — BP 93/64 | HR 65 | Ht 65.0 in | Wt 142.0 lb

## 2013-02-16 DIAGNOSIS — G43019 Migraine without aura, intractable, without status migrainosus: Secondary | ICD-10-CM

## 2013-02-16 DIAGNOSIS — R079 Chest pain, unspecified: Secondary | ICD-10-CM

## 2013-02-16 MED ORDER — VERAPAMIL HCL ER 120 MG PO TBCR: 120.0000 mg | EXTENDED_RELEASE_TABLET | Freq: Every day | ORAL | Status: DC

## 2013-02-16 NOTE — Progress Notes (Signed)
Reason for visit: Chest pains  Stacy Fernandez is a 52 y.o. female  History of present illness:  Stacy Fernandez is a 52 year old right-handed white female who has been seen through this office previously for headaches. The patient is referred back to this office for evaluation of chest pains that developed suddenly on 12/25/2012. The patient was sitting on a couch, and she had sudden onset of a chest pressure and pain sensation coming up the middle of the chest, and then going into the left shoulder and down into the upper part of the left arm. The patient developed sweats, and shortness of breath at the same time. The patient immediately went to the hospital, and she was worked up for possible angina. The patient has undergone a coronary catheterization that was unremarkable. A CT scan of the chest was done, and this revealed emphysema, but no evidence of a pulmonary embolism was noted. The patient has undergone upper endoscopy that did not show evidence of significant reflux issues. The patient has had bronchoscopy. The etiology of the chest pain has not been determined. The patient does report some neck discomfort, and some discomfort into the left shoulder. The chest pain, and shortness of breath has persisted, and it is present at all times. The severity of the pain has improved slightly since its onset. The patient continues to have headaches once or twice a week. The patient has gained essentially no benefit from propranolol or Effexor or Topamax. The patient is on low-dose gabapentin without much benefit. The patient is unable to tolerate doses greater than 200 mg at night. The patient denies that any particular activity worsens or ameliorates the chest pain. The patient denies any rash on the neck, shoulder, or arm.  Past Medical History  Diagnosis Date  . Hypothyroidism   . Shortness of breath     2D ECHO, 04/20/2012 - EF >55%, normal; EXERCISE STRESS TEST, 04/20/2012 - normal, EKG negative for  ischemia, no ECG changes  . COPD (chronic obstructive pulmonary disease)   . Emphysema   . Migraine   . Hematuria     Past Surgical History  Procedure Laterality Date  . Cholecystectomy    . Abdominal hysterectomy    . Appendectomy    . Endo wrap    . Video bronchoscopy Bilateral 01/16/2013    Procedure: VIDEO BRONCHOSCOPY WITHOUT FLUORO;  Surgeon: Oretha Milch, MD;  Location: [facility];  Service: Cardiopulmonary;  Laterality: Bilateral;    Family History  Problem Relation Age of Onset  . Heart failure Mother   . Colon cancer Father   . Throat cancer Brother   . Heart attack Maternal Grandmother   . Lung cancer Maternal Grandfather   . Colon cancer Paternal Grandmother   . Cancer Paternal Grandfather     kidney and bladder  . Lung cancer Brother   . Throat cancer Brother   . Lung cancer Brother     Social history:  reports that she has been smoking Cigarettes.  She has a 18.5 pack-year smoking history. She uses smokeless tobacco. She reports that she does not drink alcohol or use illicit drugs.  Medications:  Current Outpatient Prescriptions on File Prior to Visit  Medication Sig Dispense Refill  . aspirin 81 MG tablet Take 1 tablet (81 mg total) by mouth daily.  30 tablet  0  . atorvastatin (LIPITOR) 40 MG tablet Take 1 tablet (40 mg total) by mouth daily at 6 PM.  30 tablet  5  .  gabapentin (NEURONTIN) 100 MG capsule Take 200 mg by mouth at bedtime.       Marland Kitchen ketorolac (TORADOL) 10 MG tablet Take 1 tablet (10 mg total) by mouth every 6 (six) hours as needed (headache).  30 tablet  0  . levothyroxine (SYNTHROID, LEVOTHROID) 75 MCG tablet Take 75 mcg by mouth daily before breakfast.      . meclizine (ANTIVERT) 25 MG tablet Take 25 mg by mouth 3 (three) times daily as needed.      . mometasone-formoterol (DULERA) 100-5 MCG/ACT AERO Inhale 2 puffs into the lungs 2 (two) times daily.      . nitroGLYCERIN (NITROSTAT) 0.4 MG SL tablet Place 0.4 mg under the tongue every 5  (five) minutes as needed for chest pain.      Marland Kitchen PROAIR HFA 108 (90 BASE) MCG/ACT inhaler Inhale 2 puffs into the lungs every 4 (four) hours as needed.      . propranolol (INDERAL) 40 MG tablet Take 1 tablet (40 mg total) by mouth 2 (two) times daily.  180 tablet  1  . venlafaxine XR (EFFEXOR-XR) 150 MG 24 hr capsule Take 1 capsule (150 mg total) by mouth daily.  90 capsule  1   No current facility-administered medications on file prior to visit.    Allergies:  Allergies  Allergen Reactions  . Contrast Media (Iodinated Diagnostic Agents)     Heart stops and breathing stops    ROS:  Out of a complete 14 system review of symptoms, the patient complains only of the following symptoms, and all other reviewed systems are negative.  Weight gain, fatigue Chest pains, swelling in the legs Shortness of breath, cough, wheezing, snoring Blood in the urine Easy bruising Joint pain, achy muscles Headache, weakness, sleepiness  Blood pressure 93/64, pulse 65, height 5\' 5"  (1.651 m), weight 142 lb (64.411 kg).  Physical Exam  General: The patient is alert and cooperative at the time of the examination.  Head: Pupils are equal, round, and reactive to light. Discs are flat bilaterally.  Neck: The neck is supple, no carotid bruits are noted.  Respiratory: The respiratory examination is clear.  Cardiovascular: The cardiovascular examination reveals a regular rate and rhythm, no obvious murmurs or rubs are noted.  Neuromuscular: The patient has good range of movement of the cervical spine.  Skin: Extremities are without significant edema.  Neurologic Exam  Mental status:  Cranial nerves: Facial symmetry is present. There is good sensation of the face to pinprick and soft touch bilaterally. The strength of the facial muscles and the muscles to head turning and shoulder shrug are normal bilaterally. Speech is well enunciated, no aphasia or dysarthria is noted. Extraocular movements are  full. Visual fields are full.  Motor: The motor testing reveals 5 over 5 strength of all 4 extremities. Good symmetric motor tone is noted throughout.  Sensory: Sensory testing is intact to pinprick, soft touch, vibration sensation, and position sense on all 4 extremities. No evidence of extinction is noted.  Coordination: Cerebellar testing reveals good finger-nose-finger and heel-to-shin bilaterally.  Gait and station: Gait is normal. Tandem gait is normal. Romberg is negative. No drift is seen.  Reflexes: Deep tendon reflexes are symmetric, but are slightly depressed bilaterally. Toes are downgoing bilaterally.   Assessment/Plan:  1. Chest pain, left shoulder and arm discomfort  2. History of headache  Previously, the patient has had a very thorough workup for the chest pain, and an etiology has not been determined. The patient will undergo  MRI of the cervical spine to exclude this as a possible etiology. The description of onset, type of symptoms with shortness of breath, diaphoresis, and chest pressure would be unlikely to be secondary to a neurologic cause. Cervical spine disease however, can sometimes result in shoulder pain and upper chest pain. The patient continues to have ongoing headaches. The patient has failed multiple medications, and she may be a candidate for Botox injections in the future. The patient will be taken off of the propranolol given her shortness of breath, and she will be given a trial on Calan at 120 mg daily. The patient will followup in 3-4 months. MRI evaluation the brain done previously was normal. 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 A carotid Doppler study done previously was normal. The patient was given a note to keep her at work until 03/06/2013.  Marlan Palau MD 02/16/2013 9:07 AM  [facility] [address] [place], [place] [address]  Phone [phone] Fax [phone]

## 2013-02-16 NOTE — Patient Instructions (Signed)
Reduce the propranolol to 40 mg daily for one week, and then stop the medication. THEN start the calan (verapamil) 120 mg daily.

## 2013-02-23 ENCOUNTER — Ambulatory Visit (INDEPENDENT_AMBULATORY_CARE_PROVIDER_SITE_OTHER): Payer: BC Managed Care – PPO

## 2013-02-23 DIAGNOSIS — G43019 Migraine without aura, intractable, without status migrainosus: Secondary | ICD-10-CM

## 2013-02-23 DIAGNOSIS — R079 Chest pain, unspecified: Secondary | ICD-10-CM

## 2013-02-24 ENCOUNTER — Telehealth: Payer: Self-pay | Admitting: Neurology

## 2013-02-24 NOTE — Telephone Encounter (Signed)
I called the patient. The MRI the cervical spine shows minimal spondylosis, no spinal cord compression or nerve root compression. This does not explain her chest pain. The patient will be converted to Calan, and we will see if this helps her chest pain. Questionable Prinzmetal's angina. I do not think the chest pain is related to a neurologic problem.

## 2013-02-27 ENCOUNTER — Telehealth: Payer: Self-pay | Admitting: Neurology

## 2013-02-28 NOTE — Telephone Encounter (Signed)
Patient is requesting a work release note. She says her job will let her go back on the 14th rather than the 18th if MD feels that is fine. Patient says she just started med on 02/25/13 and job needs this work release note to confirm ok to return and when.

## 2013-02-28 NOTE — Telephone Encounter (Signed)
I'll write a note indicating that the patient may return to work and full capacity on 03/02/2013.

## 2013-03-02 LAB — AFB CULTURE WITH SMEAR (NOT AT ARMC): Acid Fast Smear: NONE SEEN

## 2013-03-04 ENCOUNTER — Other Ambulatory Visit: Payer: Self-pay | Admitting: *Deleted

## 2013-03-04 MED ORDER — VERAPAMIL HCL ER 120 MG PO TBCR: 120.0000 mg | EXTENDED_RELEASE_TABLET | Freq: Every day | ORAL | Status: DC

## 2013-04-13 ENCOUNTER — Telehealth: Payer: Self-pay | Admitting: Neurology

## 2013-04-13 ENCOUNTER — Encounter: Payer: Self-pay | Admitting: Cardiovascular Disease

## 2013-04-13 ENCOUNTER — Ambulatory Visit (INDEPENDENT_AMBULATORY_CARE_PROVIDER_SITE_OTHER): Payer: BC Managed Care – PPO | Admitting: Cardiovascular Disease

## 2013-04-13 VITALS — BP 120/80 | HR 74 | Ht 66.0 in | Wt 151.9 lb

## 2013-04-13 DIAGNOSIS — R079 Chest pain, unspecified: Secondary | ICD-10-CM

## 2013-04-13 DIAGNOSIS — J449 Chronic obstructive pulmonary disease, unspecified: Secondary | ICD-10-CM

## 2013-04-13 DIAGNOSIS — F17200 Nicotine dependence, unspecified, uncomplicated: Secondary | ICD-10-CM

## 2013-04-13 DIAGNOSIS — Z720 Tobacco use: Secondary | ICD-10-CM

## 2013-04-13 DIAGNOSIS — I2510 Atherosclerotic heart disease of native coronary artery without angina pectoris: Secondary | ICD-10-CM

## 2013-04-13 NOTE — Patient Instructions (Addendum)
Your physician recommends that you schedule a follow-up appointment in: 6 months  

## 2013-04-14 MED ORDER — TIZANIDINE HCL 2 MG PO TABS: 2.0000 mg | ORAL_TABLET | Freq: Two times a day (BID) | ORAL | Status: DC

## 2013-04-14 NOTE — Telephone Encounter (Signed)
Patient would like to try Botox.

## 2013-04-14 NOTE — Telephone Encounter (Signed)
I called patient. The patient is still having very frequent headaches. The patient is averaging about 12-15 headaches a month. I will start tizanidine. The patient has failed multiple medications for her headache. The patient will come back to signed consent form for Botox injections.

## 2013-04-16 ENCOUNTER — Encounter: Payer: Self-pay | Admitting: Cardiovascular Disease

## 2013-04-16 DIAGNOSIS — I2510 Atherosclerotic heart disease of native coronary artery without angina pectoris: Secondary | ICD-10-CM | POA: Insufficient documentation

## 2013-04-16 NOTE — Progress Notes (Signed)
Patient ID: Stacy Fernandez, female   DOB: 1960/08/19, 52 y.o.   MRN: [id number]     HPI: Stacy Fernandez, is a 52 y.o. female who presents for cardiology evaluation.  Ms. Stacy Fernandez is a 52 year old female who has a long-standing history of tobacco use in excess of 36 years. She also has a history of migraine headaches as well as COPD/emphysema. In October 2013 a nuclear perfusion study showed normal perfusion and function. Additionally she also has a history of GERD with reflux and remotely had undergone esophageal manometry and had undergone a procedure to her esophagus she had recently undergone a cardiopulmonary met  In May and became significantly short of breath during the procedure per apparently recently, she [facility] with chest pain radiating down her left arm and back associated with lightheadedness and diaphoresis on 12/25/2012 29 2014 she underwent cardiac catheterization by Dr. Allyson Sabal which showed an a 40% ostial first diagonal branch stenosis of the LAD. Her vessels were normal. Ejection fraction was normal at 60%. She continues to smoke cigarettes. She denies further chest pain. She does admit to a cough. She presents for evaluation.  Past Medical History  Diagnosis Date  . Hypothyroidism   . Shortness of breath     2D ECHO, 04/20/2012 - EF >55%, normal; EXERCISE STRESS TEST, 04/20/2012 - normal, EKG negative for ischemia, no ECG changes  . COPD (chronic obstructive pulmonary disease)   . Emphysema   . Migraine   . Hematuria     Past Surgical History  Procedure Laterality Date  . Cholecystectomy    . Abdominal hysterectomy    . Appendectomy    . Endo wrap    . Video bronchoscopy Bilateral 01/16/2013    Procedure: VIDEO BRONCHOSCOPY WITHOUT FLUORO;  Surgeon: Oretha Milch, MD;  Location: [facility];  Service: Cardiopulmonary;  Laterality: Bilateral;    Allergies  Allergen Reactions  . Contrast Media [Iodinated Diagnostic Agents]     Heart stops and  breathing stops    Current Outpatient Prescriptions  Medication Sig Dispense Refill  . aspirin 81 MG tablet Take 1 tablet (81 mg total) by mouth daily.  30 tablet  0  . gabapentin (NEURONTIN) 100 MG capsule Take 200 mg by mouth at bedtime.       Marland Kitchen ketorolac (TORADOL) 10 MG tablet Take 1 tablet (10 mg total) by mouth every 6 (six) hours as needed (headache).  30 tablet  0  . levothyroxine (SYNTHROID, LEVOTHROID) 75 MCG tablet Take 75 mcg by mouth daily before breakfast.      . meclizine (ANTIVERT) 25 MG tablet Take 25 mg by mouth 3 (three) times daily as needed.      . mometasone-formoterol (DULERA) 100-5 MCG/ACT AERO Inhale 2 puffs into the lungs 2 (two) times daily.      . nitroGLYCERIN (NITROSTAT) 0.4 MG SL tablet Place 0.4 mg under the tongue every 5 (five) minutes as needed for chest pain.      Marland Kitchen PROAIR HFA 108 (90 BASE) MCG/ACT inhaler Inhale 2 puffs into the lungs every 4 (four) hours as needed.      . venlafaxine XR (EFFEXOR-XR) 150 MG 24 hr capsule Take 1 capsule (150 mg total) by mouth daily.  90 capsule  1  . verapamil (CALAN SR) 120 MG CR tablet Take 1 tablet (120 mg total) by mouth at bedtime.  90 tablet  2  . tiZANidine (ZANAFLEX) 2 MG tablet Take 1 tablet (2 mg total) by  mouth 2 (two) times daily.  60 tablet  2   No current facility-administered medications for this visit.    History   Social History  . Marital Status: Widowed    Spouse Name: N/A    Number of Children: 4  . Years of Education: GED   Occupational History  .     Social History Main Topics  . Smoking status: Current Every Day Smoker -- 0.50 packs/day for 37 years    Types: Cigarettes  . Smokeless tobacco: Current User     Comment: uses e-cig 01/03/13  . Alcohol Use: No  . Drug Use: No  . Sexual Activity: Not on file   Other Topics Concern  . Not on file   Social History Narrative  . No narrative on file    Family History  Problem Relation Age of Onset  . Heart failure Mother   . Colon  cancer Father   . Throat cancer Brother   . Heart attack Maternal Grandmother   . Lung cancer Maternal Grandfather   . Colon cancer Paternal Grandmother   . Cancer Paternal Grandfather     kidney and bladder  . Lung cancer Brother   . Throat cancer Brother   . Lung cancer Brother    Socially she is married. She has been smoking cigarettes for many years. There is no alcohol. She has 4 children 5 grandchildren.  ROS is negative for fevers, chills or night sweats. She does admit to a cough. She sees Dr. Hilary Hertz for pulmonary care. She also seen Dr. Anne Hahn of neurology. She does have GERD intermittently. She denies palpitations. There is no presyncope. She denies exertional chest tightness. She denies abdominal pain. She denies claudication. She denies edema. There is no tremor.   Other system review is negative.  PE BP 120/80  Pulse 74  Ht 5\' 6"  (1.676 m)  Wt 151 lb 14.4 oz (68.901 kg)  BMI 24.53 kg/m2  General: Alert, oriented, no distress.  Skin: normal turgor, no rashes HEENT: Normocephalic, atraumatic. Pupils round and reactive; sclera anicteric;no lid lag.  Nose without nasal septal hypertrophy Mouth/Parynx benign; Mallinpatti scale 2 Neck: No JVD, no carotid briuts Lungs: Decreased breath no wheezing or rales Heart: RRR, s1 s2 normal 1/6 systolic murmur Abdomen: soft, nontender; no hepatosplenomehaly, BS+; abdominal aorta nontender and not dilated by palpation. Pulses 2+ Extremities: no clubbing cyanosis or edema, Homan's sign negative  Neurologic: grossly nonfocal  ECG: Sinus rhythm at 74 beats per minute.  LABS:  BMET    Component Value Date/Time   NA 139 01/05/2013 1830   K 4.4 01/05/2013 1830   CL 104 01/05/2013 1830   CO2 27 01/05/2013 1830   GLUCOSE 89 01/05/2013 1830   BUN 15 01/05/2013 1830   CREATININE 0.81 01/05/2013 1830   CALCIUM 9.5 01/05/2013 1830   GFRNONAA 83* 01/05/2013 1830   GFRAA >90 01/05/2013 1830     Hepatic Function Panel     Component Value  Date/Time   PROT 5.7* 12/26/2012 0340   ALBUMIN 3.3* 12/26/2012 0340   AST 50* 12/26/2012 0340   ALT 26 12/26/2012 0340   ALKPHOS 83 12/26/2012 0340   BILITOT 0.3 12/26/2012 0340     CBC    Component Value Date/Time   WBC 6.0 01/05/2013 1830   RBC 3.73* 01/05/2013 1830   HGB 11.8* 01/05/2013 1830   HCT 33.8* 01/05/2013 1830   PLT 257 01/05/2013 1830   MCV 90.6 01/05/2013 1830   MCH 31.6 01/05/2013  1830   MCHC 34.9 01/05/2013 1830   RDW 12.7 01/05/2013 1830   LYMPHSABS 2.1 01/05/2013 1830   MONOABS 0.5 01/05/2013 1830   EOSABS 0.1 01/05/2013 1830   BASOSABS 0.0 01/05/2013 1830     BNP    Component Value Date/Time   PROBNP 326.1* 12/26/2012 0553    Lipid Panel     Component Value Date/Time   CHOL 149 12/26/2012 0340   TRIG 86 12/26/2012 0340   HDL 34* 12/26/2012 0340   CHOLHDL 4.4 12/26/2012 0340   VLDL 17 12/26/2012 0340   LDLCALC 98 12/26/2012 0340     RADIOLOGY: No results found.    ASSESSMENT AND PLAN: I did review Ms. Burge recent cardiac catheterization. She was found to have 45% ostial first diagonal branch stenosis. It was felt that her chest pain most likely was noncardiac. She does have a history of GE RD as well as esophageal issues. She also has COPD and continues to smoke. She now is on verapamil 120 mg. I recommended she discontinue the propranolol 40 twice a day regimen. She does have a cough. She's not on any sedation. Is possible this may be related to reflux or her COPD. I discussed the importance of smoking cessation. I will see her in 6 months followup evaluation.     Lennette Bihari, MD, [facility]  04/16/2013 8:42 PM

## 2013-05-03 ENCOUNTER — Ambulatory Visit (INDEPENDENT_AMBULATORY_CARE_PROVIDER_SITE_OTHER): Payer: BC Managed Care – PPO | Admitting: Neurology

## 2013-05-03 ENCOUNTER — Encounter: Payer: Self-pay | Admitting: Neurology

## 2013-05-03 VITALS — BP 103/70 | HR 84 | Ht 65.0 in | Wt 151.0 lb

## 2013-05-03 DIAGNOSIS — R51 Headache: Secondary | ICD-10-CM

## 2013-05-03 DIAGNOSIS — G43719 Chronic migraine without aura, intractable, without status migrainosus: Secondary | ICD-10-CM

## 2013-05-03 MED ORDER — ONABOTULINUMTOXINA 100 UNITS IJ SOLR
150.0000 [IU] | Freq: Once | INTRAMUSCULAR | Status: AC
  Administered 2013-05-03: 150 [IU] via INTRAMUSCULAR

## 2013-05-03 NOTE — Progress Notes (Signed)
History of present illness:  Stacy Fernandez is a 52 year old right-handed white female referred by Dr. Anne Hahn for Botox injection as migraine prevention  She had frequent headaches since May 2013, she woke up in May, notice left-sided headache, across her left forehead, feel likes that her left eye is going to pop out, she has no visual loss, first severe headache lasts about 2 days, tried numerous over-the-counter medications, including Excedrin Migraine, Tylenol, Aleve without help,  Ever since the initial episode, she has frequent headaches, most on the left side, occasionally on the right side, excruciating pounding headache, with associated light noise sensitivity, nauseous, she often woke up from sleep with headaches  CT head without contrast was normal, MRI cervical showed mild spondylitic disease, but no acute lesions MRI of the brain done previously was normal. 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 A carotid Doppler study done previously was normal.    Over the past 2 years, she has tried different preventative medications this including propranolol, Topamax, Effexor, without helping,  She still has 6 headaches in the weeks, moderately severe, lasting for few days  Past Medical History  Diagnosis Date  . Hypothyroidism   . Shortness of breath     2D ECHO, 04/20/2012 - EF >55%, normal; EXERCISE STRESS TEST, 04/20/2012 - normal, EKG negative for ischemia, no ECG changes  . COPD (chronic obstructive pulmonary disease)   . Emphysema   . Migraine   . Hematuria     Past Surgical History  Procedure Laterality Date  . Cholecystectomy    . Abdominal hysterectomy    . Appendectomy    . Endo wrap    . Video bronchoscopy Bilateral 01/16/2013    Procedure: VIDEO BRONCHOSCOPY WITHOUT FLUORO;  Surgeon: Oretha Milch, MD;  Location: [facility];  Service: Cardiopulmonary;  Laterality: Bilateral;    Family History  Problem Relation Age of Onset  . Heart  failure Mother   . Colon cancer Father   . Throat cancer Brother   . Heart attack Maternal Grandmother   . Lung cancer Maternal Grandfather   . Colon cancer Paternal Grandmother   . Cancer Paternal Grandfather     kidney and bladder  . Lung cancer Brother   . Throat cancer Brother   . Lung cancer Brother     Social history:  reports that she has been smoking Cigarettes.  She has a 18.5 pack-year smoking history. She uses smokeless tobacco. She reports that she does not drink alcohol or use illicit drugs.  Medications:  Current Outpatient Prescriptions on File Prior to Visit  Medication Sig Dispense Refill  . aspirin 81 MG tablet Take 1 tablet (81 mg total) by mouth daily.  30 tablet  0  . gabapentin (NEURONTIN) 100 MG capsule Take 200 mg by mouth at bedtime.       Marland Kitchen ketorolac (TORADOL) 10 MG tablet Take 1 tablet (10 mg total) by mouth every 6 (six) hours as needed (headache).  30 tablet  0  . levothyroxine (SYNTHROID, LEVOTHROID) 75 MCG tablet Take 75 mcg by mouth daily before breakfast.      . mometasone-formoterol (DULERA) 100-5 MCG/ACT AERO Inhale 2 puffs into the lungs 2 (two) times daily.      . nitroGLYCERIN (NITROSTAT) 0.4 MG SL tablet Place 0.4 mg under the tongue every 5 (five) minutes as needed for chest pain.      Marland Kitchen PROAIR HFA 108 (90 BASE) MCG/ACT inhaler Inhale 2 puffs into the  lungs every 4 (four) hours as needed.      Marland Kitchen tiZANidine (ZANAFLEX) 2 MG tablet Take 1 tablet (2 mg total) by mouth 2 (two) times daily.  60 tablet  2  . venlafaxine XR (EFFEXOR-XR) 150 MG 24 hr capsule Take 1 capsule (150 mg total) by mouth daily.  90 capsule  1  . verapamil (CALAN SR) 120 MG CR tablet Take 1 tablet (120 mg total) by mouth at bedtime.  90 tablet  2   No current facility-administered medications on file prior to visit.    Allergies:  Allergies  Allergen Reactions  . Contrast Media [Iodinated Diagnostic Agents]     Heart stops and breathing stops    ROS:  Out of a complete  14 system review of symptoms, the patient complains only of the following symptoms, and all other reviewed systems are negative.  Weight gain, fatigue Chest pains, swelling in the legs Shortness of breath, cough, wheezing, snoring Blood in the urine Easy bruising Joint pain, achy muscles Headache, weakness, sleepiness  Blood pressure 103/70, pulse 84, height 5\' 5"  (1.651 m), weight 151 lb (68.493 kg).  PHYSICAL EXAMINATOINS:  Generalized: In no acute distress  Neck: Supple, no carotid bruits   Cardiac: Regular rate rhythm  Pulmonary: Clear to auscultation bilaterally  Musculoskeletal: No deformity  Neurological examination  Mentation: Alert oriented to time, place, history taking, and causual conversation  Cranial nerve II-XII: Pupils were equal round reactive to light extraocular movements were full, visual field were full on confrontational test. facial sensation and strength were normal. hearing was intact to finger rubbing bilaterally. Uvula tongue midline.  head turning and shoulder shrug and were normal and symmetric.Tongue protrusion into cheek strength was normal.  Motor: normal tone, bulk and strength.  Sensory: Intact to fine touch, pinprick, preserved vibratory sensation, and proprioception at toes.  Coordination: Normal finger to nose, heel-to-shin bilaterally there was no truncal ataxia  Gait: Rising up from seated position without assistance, normal stance, without trunk ataxia, moderate stride, good arm swing, smooth turning, able to perform tiptoe, and heel walking without difficulty.   Romberg signs: Negative  Deep tendon reflexes: Brachioradialis 2/2, biceps 2/2, triceps 2/2, patellar 2/2, Achilles 2/2, plantar responses were flexor bilaterally.  Assessment/Plan:  52 years old Caucasian female, with frequent headaches, with migraine features, came in for Botox injection as migraine prevention. BOTOX injection was performed according to protocol by  [employer]. 100 units of BOTOX was dissolved into 2 cc NS. (Lot No.[id number] May 2017).  Total of 150 units   Corrugator 2 sites, 10 units Frontalis 4 sites,  20 units, Temporalis 8 sites,  40 units  Occipitalis 6 sites, 30 units Cervical Paraspinal, 4 sites, 20 units Trapezius, 6 sites, 30 units  Patient tolerate the injection well. Will return for repeat injection in 3 months.

## 2013-05-25 ENCOUNTER — Other Ambulatory Visit: Payer: Self-pay

## 2013-06-05 ENCOUNTER — Other Ambulatory Visit: Payer: Self-pay | Admitting: Neurology

## 2013-06-16 ENCOUNTER — Other Ambulatory Visit: Payer: Self-pay | Admitting: Neurology

## 2013-07-17 ENCOUNTER — Other Ambulatory Visit: Payer: Self-pay | Admitting: Neurology

## 2013-08-09 ENCOUNTER — Telehealth: Payer: Self-pay | Admitting: *Deleted

## 2013-08-09 ENCOUNTER — Encounter: Payer: Self-pay | Admitting: Neurology

## 2013-08-09 ENCOUNTER — Ambulatory Visit (INDEPENDENT_AMBULATORY_CARE_PROVIDER_SITE_OTHER): Payer: BC Managed Care – PPO | Admitting: Neurology

## 2013-08-09 VITALS — BP 118/85 | HR 85 | Ht 65.0 in | Wt 160.0 lb

## 2013-08-09 DIAGNOSIS — J441 Chronic obstructive pulmonary disease with (acute) exacerbation: Secondary | ICD-10-CM

## 2013-08-09 DIAGNOSIS — G43719 Chronic migraine without aura, intractable, without status migrainosus: Secondary | ICD-10-CM

## 2013-08-09 DIAGNOSIS — I2510 Atherosclerotic heart disease of native coronary artery without angina pectoris: Secondary | ICD-10-CM

## 2013-08-09 DIAGNOSIS — E039 Hypothyroidism, unspecified: Secondary | ICD-10-CM

## 2013-08-09 DIAGNOSIS — G43019 Migraine without aura, intractable, without status migrainosus: Secondary | ICD-10-CM

## 2013-08-09 DIAGNOSIS — J449 Chronic obstructive pulmonary disease, unspecified: Secondary | ICD-10-CM

## 2013-08-09 MED ORDER — ONABOTULINUMTOXINA 100 UNITS IJ SOLR
150.0000 [IU] | Freq: Once | INTRAMUSCULAR | Status: AC
  Administered 2013-08-09: 150 [IU] via INTRAMUSCULAR

## 2013-08-09 MED ORDER — RIZATRIPTAN BENZOATE 5 MG PO TBDP: 5.0000 mg | ORAL_TABLET | ORAL | Status: DC | PRN

## 2013-08-09 NOTE — Progress Notes (Signed)
History of present illness:  Stacy Fernandez is a 53 year old right-handed white female referred by Dr. Jannifer Franklin for Botox injection as migraine prevention  She had frequent headaches since May 2013, she woke up in May, notice left-sided headache, across her left forehead, feel likes that her left eye is going to pop out, she has no visual loss, first severe headache lasts about 2 days, tried numerous over-the-counter medications, including Excedrin Migraine, Tylenol, Aleve without help,  Ever since the initial episode, she has frequent headaches, most on the left side, occasionally on the right side, excruciating pounding headache, with associated light noise sensitivity, nauseous, she often woke up from sleep with headaches  CT head without contrast was normal, MRI cervical showed mild spondylitic disease, but no acute lesions MRI of the brain done previously was normal. 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    Over the past 2 years, she has tried different preventative medications this including propranolol, Topamax, Effexor, without helping,  She still has 6 headaches in a week , moderately severe, lasting for few days  UPDATE Aug 09, 2013:  She received first Botox injection as migraine prevention in May 03 2013, responded very well, reported 50% improvement, she has only has half of the headache she used to have, but is more painful, she is taking Toradol 10 mg as needed for migraine treatment, per patient, she has never tried triptan treatment in the past, she denies history of strokes, coronary artery disease,    Past Medical History  Diagnosis Date  . Hypothyroidism   . Shortness of breath     2D ECHO, 04/20/2012 - EF >55%, normal; EXERCISE STRESS TEST, 04/20/2012 - normal, EKG negative for  ischemia, no ECG changes  . COPD (chronic obstructive pulmonary disease)   . Emphysema   . Migraine   . Hematuria     Past Surgical History  Procedure Laterality Date  . Cholecystectomy    . Abdominal hysterectomy    . Appendectomy    . Endo wrap    . Video bronchoscopy Bilateral 01/16/2013    Procedure: VIDEO BRONCHOSCOPY WITHOUT FLUORO;  Surgeon: [name], MD;  Location: [facility];  Service: Cardiopulmonary;  Laterality: Bilateral;    Family History  Problem Relation Age of Onset  . Heart failure Mother   . Colon cancer Father   . Throat cancer Brother   . Heart attack Maternal Grandmother   . Lung cancer Maternal Grandfather   . Colon cancer Paternal Grandmother   . Cancer Paternal Grandfather     kidney and bladder  . Lung cancer Brother   . Throat cancer Brother   . Lung cancer Brother     Social history:  reports that she has been smoking Cigarettes.  She has a 18.5 pack-year smoking history. She uses smokeless tobacco. She reports that she does not drink alcohol or use illicit drugs.  Medications:  Current Outpatient Prescriptions on File Prior to Visit  Medication Sig Dispense Refill  . aspirin 81 MG tablet Take 1 tablet (81 mg total) by mouth daily.  30 tablet  0  . gabapentin (NEURONTIN) 100 MG capsule Take 200 mg by mouth at bedtime.       Marland Kitchen ketorolac (TORADOL) 10 MG tablet Take 1 tablet (10 mg total) by mouth every 6 (six) hours as needed (headache).  30 tablet  0  . levothyroxine (SYNTHROID, LEVOTHROID) 75 MCG tablet Take 75 mcg by mouth daily before breakfast.      . mometasone-formoterol (DULERA) 100-5 MCG/ACT AERO Inhale 2 puffs into the lungs 2 (two) times daily.      . nitroGLYCERIN (NITROSTAT) 0.4 MG SL tablet Place 0.4 mg under the tongue every 5 (five) minutes as needed for chest pain.      Marland Kitchen PROAIR HFA 108 (90 BASE) MCG/ACT inhaler Inhale 2 puffs into the lungs every 4 (four) hours as needed.      Marland Kitchen tiZANidine (ZANAFLEX) 2 MG tablet Take 1  tablet (2 mg total) by mouth 2 (two) times daily.  60 tablet  2  . venlafaxine XR (EFFEXOR-XR) 150 MG 24 hr capsule Take 1 capsule (150 mg total) by mouth daily.  90 capsule  1  . verapamil (CALAN SR) 120 MG CR tablet Take 1 tablet (120 mg total) by mouth at bedtime.  90 tablet  2   No current facility-administered medications on file prior to visit.    Allergies:  Allergies  Allergen Reactions  . Contrast Media [Iodinated Diagnostic Agents]     Heart stops and breathing stops    ROS:  Out of a complete 14 system review of symptoms, the patient complains only of the following symptoms, and all other reviewed systems are negative.  Headaches  Blood pressure 118/85, pulse 85, height 5\' 5"  (1.651 m), weight 160 lb (72.576 kg).  PHYSICAL EXAMINATOINS:  Generalized: In no acute distress  Neck: Supple, no carotid bruits   Cardiac: Regular rate rhythm  Pulmonary: Clear to auscultation bilaterally  Musculoskeletal: No deformity  Neurological examination  Mentation: Alert oriented to time, place, history taking, and causual conversation  Cranial nerve II-XII: Pupils were equal round reactive to light extraocular movements were full, visual field were full on confrontational test. facial sensation and strength were normal. hearing was intact to finger rubbing bilaterally. Uvula tongue midline.  head turning and shoulder shrug and were normal and symmetric.Tongue protrusion into cheek strength was normal.  Motor: normal tone, bulk and strength.  Sensory: Intact to fine touch, pinprick, preserved vibratory sensation, and proprioception at toes.  Coordination: Normal finger to nose, heel-to-shin bilaterally there was no truncal ataxia  Gait: Rising up from seated position without assistance, normal stance, without trunk ataxia, moderate stride, good arm swing, smooth turning, able to perform tiptoe, and heel walking without difficulty.   Romberg signs: Negative  Deep tendon  reflexes: Brachioradialis 2/2, biceps 2/2, triceps 2/2, patellar 2/2, Achilles 2/2, plantar responses were flexor bilaterally.  Assessment/Plan:  53 years old Caucasian female, with frequent headaches, with migraine features, came in for Botox injection as migraine prevention. BOTOX injection was performed according to protocol by [employer]. 100 units of BOTOX was dissolved into 2 cc NS. (Lot [id number] June 2017).  Total of 150 units   Corrugator 2 sites, 10 units Frontalis 4 sites,  20 units, Temporalis 8 sites,  40 units  Occipitalis 6 sites, 30 units Cervical Paraspinal, 4 sites, 20 units Trapezius, 6 sites, 30 units  Patient tolerate the injection well. Will return for repeat injection in 3 months.

## 2013-08-09 NOTE — Telephone Encounter (Signed)
Pt here for botox and Dr. Krista Blue was going to give her medication under her tongue for her migraines.  Consulted Dr. Krista Blue, Maxalt sublingual. She will prescribe and place prescription.  I told her that this is a triptan, insurance may give her 9-10 tabs/ month.  Use at onset of migraine may repeat in 2 hours prn.   Pt verbalized understanding.

## 2013-08-11 ENCOUNTER — Telehealth: Payer: Self-pay | Admitting: Neurology

## 2013-08-11 NOTE — Telephone Encounter (Signed)
Patient would like a call back regarding new prescription Dr. Krista Blue gave her and possible interaction with another medication she is taking (venlafaxine). Pharmacy told her to call Dr. Krista Blue.  Please call to advise

## 2013-08-11 NOTE — Telephone Encounter (Signed)
I have called her, she is taking Effexor 150 mg 5:30 AM every morning, which has been effective for her depression anxiety, I gave her prescription of Maxalt 5 mg as needed,  She should have very little chance of developed serotonin syndrome, with occasionally mild to moderate dose of Maxalt for migraine headaches,

## 2013-09-06 ENCOUNTER — Ambulatory Visit: Payer: BC Managed Care – PPO | Admitting: Neurology

## 2013-09-13 ENCOUNTER — Encounter: Payer: Self-pay | Admitting: *Deleted

## 2013-09-13 ENCOUNTER — Ambulatory Visit (INDEPENDENT_AMBULATORY_CARE_PROVIDER_SITE_OTHER): Payer: BC Managed Care – PPO

## 2013-09-13 ENCOUNTER — Ambulatory Visit (INDEPENDENT_AMBULATORY_CARE_PROVIDER_SITE_OTHER): Payer: BC Managed Care – PPO | Admitting: Podiatrist

## 2013-09-13 ENCOUNTER — Encounter: Payer: Self-pay | Admitting: Podiatrist

## 2013-09-13 ENCOUNTER — Telehealth: Payer: Self-pay | Admitting: *Deleted

## 2013-09-13 VITALS — BP 116/71 | HR 86 | Resp 16 | Ht 66.0 in | Wt 148.0 lb

## 2013-09-13 DIAGNOSIS — M2040 Other hammer toe(s) (acquired), unspecified foot: Secondary | ICD-10-CM

## 2013-09-13 DIAGNOSIS — M7150 Other bursitis, not elsewhere classified, unspecified site: Secondary | ICD-10-CM

## 2013-09-13 DIAGNOSIS — Q828 Other specified congenital malformations of skin: Secondary | ICD-10-CM

## 2013-09-13 DIAGNOSIS — M799 Soft tissue disorder, unspecified: Secondary | ICD-10-CM

## 2013-09-13 DIAGNOSIS — L84 Corns and callosities: Secondary | ICD-10-CM

## 2013-09-13 NOTE — Progress Notes (Signed)
   Subjective:    Patient ID: Stacy Fernandez, female    DOB: 1960-10-03, 53 y.o.   MRN: [id number]  HPI Comments: "I have these places that are so sore"  Patient c/o burning sensations 4th toe left (medial side) and 3rd (lateral side) and 4th (medial side) toes right for about 3 months. The areas are callused and slightly red. States they feel raw. Shoes are aggravating. The toes rub together when she walks. Dr. Greta Doom office Rx'd Lanacaine cream to use. Helps temporarily. She has also been using medicated pads.  Patient also has a callused area plantar forefoot left. She says the area has been frozen twice. Uncomfortable walking.     Review of Systems  Constitutional: Positive for fatigue and unexpected weight change.  Respiratory: Positive for cough, chest tightness, shortness of breath and wheezing.   Cardiovascular: Positive for chest pain and leg swelling.       Calf pain with walking  Gastrointestinal: Positive for abdominal distention.  Endocrine: Positive for cold intolerance.  Genitourinary: Positive for hematuria.  Musculoskeletal: Positive for gait problem.  Neurological: Positive for headaches.  Hematological: Bruises/bleeds easily.  All other systems reviewed and are negative.       Objective:   Physical Exam GENERAL APPEARANCE: Alert, conversant. Appropriately groomed. No acute distress.  VASCULAR: Pedal pulses palpable at 2/4 DP and PT bilateral.  Capillary refill time is immediate to all digits,  Proximal to distal cooling it warm to warm.  Digital hair growth is present bilateral  NEUROLOGIC: sensation is intact epicritically and protectively to 5.07 monofilament at 5/5 sites bilateral.  Light touch is intact bilateral, vibratory sensation intact bilateral, achilles tendon reflex is intact bilateral.  MUSCULOSKELETAL: acceptable muscle strength, tone and stability bilateral. Hammertoe contracture of the left 3,4 digits and right 3,4 are noted.  Otherwise rectus foot  type is seen.   DERMATOLOGIC: hyperkeratotic lesion present on the medial side of the right 4th toe, lateral side of the left 3rd toe and medial side of the left 4th toe.  An intractable hyperkeratotic lesion is also present submet 3 left foot- possible verrucous lesion.  Otherwise, skin color, texture, and turger are within normal limits.    Assessment & Plan:  Hammertoe contracture with reactive hyperkeratotic lesions present.  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  I injected the left and right digits with dexamethasone and marcaine mixture to decrease the pain and inflammation of the digits.  Debrided the lesions with a 15 blade without complication.  I recommended a curretage of the lesion of the plantar left foot and the patient agreed.  I anesthetized sublesionally with lidocaine plain and prepped with skin with betadine the lesion was then excised with a 15 blade and sent to pathology for diagnosis.  I recommended wider steel toe shoes or changing the shoes to a tennis shoe with a steel toe outer protective cover.  She will be seen back in 1 month for follow up-- may be a surgical candidate at that time if conservative therapies fail.

## 2013-09-13 NOTE — Progress Notes (Deleted)
Left 4th medail,  Right 4 medil, right 3rd toe laterla,  submet 3 left  Dex, marcaine, debride-- back in 1 month .Marland Kitchen Possible surgery.

## 2013-09-13 NOTE — Telephone Encounter (Signed)
Skin wedge left 4th MPJ, sent to [facility] for definitive diagnosis r/o wart.

## 2013-09-13 NOTE — Patient Instructions (Signed)
Purchase some LAMBSWOOL-- usually found at [facility] to go between the toes to cushion in your steel toe shoes  Corns and Calluses A thickening of the skin layer (usually over bony areas, such as toe joints) is known as a corn. Two types of corns exist: hard corns and soft corns. Calluses are painless areas of skin thickening that are caused by repeated pressure or irritation. Corns tend to affect toe joints and the skin between the toes; whereas, a callus can appear on any part of the body (especially the hands, feet, or knees).  SYMPTOMS   Corn:  Presence of a small (1/8 to 3/8 inch [3 to 10 mm in diameter]), painful bump on the side or over the joint of a toe.  Hard corns are more common on the outer portion of the little (fifth) toe at the joint.  Soft corns are more common between bony bumps (prominences), usually between the fourth and fifth toes or between the second and third toes.  Callus:  A rough, thickened area of skin that appears after repeated pressure or irritation. CAUSES  The purpose of corns and calluses is to protect an area of skin from injury caused by repeated irritation (rubbing or squeezing). The presence of pressure causes the skin cells to grow at a faster rate than the cells of unaffected areas. This leads to an overgrowth (corn or callus). As apposed to hard corns, soft corns tend to develop between toes, because there is more moisture. Soft corns are often the result of prolonged shoe wear, which leads to increased perspiration and moisture.  RISK INCREASES WITH:  Shoes that are too tight.  Occupations or sports that involve repetitive pressure on the hands (racquetball and baseball) or sudden stops on hard surfaces (track and tennis).  Sports that require the athlete to wear shoes, perspire, or wear clothing or protective gear that causes the production of heat and friction. PREVENTION  Properly fitted shoes and equipment.  Modify activities to prevent constant  pressure on specific areas of skin.  If possible, wear padding over areas of skin that are exposed to repeated pressure or irritation.  Keep the area between the toes dry (with powder or by removing shoes often).  Relieve shoe pressure by stretching the areas of the shoe that cause the pressure and or use ointments to soften leather shoes. PROGNOSIS  Corns and calluses typically subside if the activity that causes them is eliminated. Recovery may take up to 3 weeks. Recurrence is likely even with treatment if the cause is not removed.  RELATED COMPLICATIONS  If one overcompensates in an attempt to avoid pain, he or she may experience pain in other areas due to the changes in body movements (mechanics). TREATMENT  The best way to treat corns and calluses is to remove the source of pressure. Corn and callus pads may be helpful in reducing pressure on the affected skin. For soft corns, try to keep the affected area dry. If you cannot find shoes that fit properly, a shoe repair shop may be able to alter your shoes to reduce pressure. Occasionally a cushion for the bottom of the foot (metatarsal bar) worn within the shoe may relieve pressure on corns or calluses of the foot. For calluses, you may be able to peel or rub the thickened area with a pumice stone, sandstone, callus file, or with sandpaper to remove the callus; wetting the affected area may make this process more effective. Do not cut the corn or callus  with a razor or knife. If the corn or callus must be removed, then a medically trained person should perform the procedure. After peeling away the upper layers of a corn once or twice a day, it may be recommended to apply a non-prescription 5% to 91% salicylic ointment and cover the area with a bandage. It very uncommon to have the bony bumps (at toe joints) surgically removed. MEDICATION   If pain medication is necessary, nonsteroidal anti-inflammatory medications, such as aspirin and ibuprofen,  or other minor pain relievers, such as acetaminophen, are often recommended. Contact your caregiver immediately if any bleeding, stomach upset, or signs of an allergic reaction occur.  Topical salicylic ointments (5% to 10%) may be of benefit.  Prescription pain medications may be given by a caregiver. Use only as directed and only as much as you need.  Soak the foot for 20 minutes, twice a day, in a gallon of warm water. This may help to soften corns and calluses. Care should be taken to thoroughly dry the foot, especially between the toes, after soaking. SEEK MEDICAL CARE IF:   Symptoms get worse or do not improve in 2 weeks despite treatment.  Any signs of infection develop, including redness, swelling, increased pain or tenderness, or increased warmth around the corn or callus.  New, unexplained symptoms develop (drugs used in treatment may produce side effects). Document Released: 07/06/2005 Document Revised: 09/28/2011 Document Reviewed: 10/18/2008 [facility] Patient Information 2014 [place], [place].

## 2013-09-14 ENCOUNTER — Ambulatory Visit: Payer: BC Managed Care – PPO | Admitting: Podiatrist

## 2013-10-13 ENCOUNTER — Ambulatory Visit (INDEPENDENT_AMBULATORY_CARE_PROVIDER_SITE_OTHER): Payer: BC Managed Care – PPO | Admitting: Podiatrist

## 2013-10-13 ENCOUNTER — Encounter: Payer: Self-pay | Admitting: Podiatrist

## 2013-10-13 VITALS — BP 129/77 | HR 79 | Resp 15 | Ht 66.0 in | Wt 146.0 lb

## 2013-10-13 DIAGNOSIS — Q828 Other specified congenital malformations of skin: Secondary | ICD-10-CM

## 2013-10-13 DIAGNOSIS — M216X9 Other acquired deformities of unspecified foot: Secondary | ICD-10-CM

## 2013-10-13 DIAGNOSIS — M216X2 Other acquired deformities of left foot: Secondary | ICD-10-CM

## 2013-10-13 NOTE — Progress Notes (Signed)
Pt presents for follow up of debrided skin lesions, and to discuss other options.  Subjective: Patient presents today for followup of skin lesions bilateral feet and corns between her toes. At the last visit we deeply removed the lesion submetatarsal 2 of the left foot and it has since returned. The patient would like to know other options as she states she's been a significant amount of pain.  Objective: The patient continues to have a intractable plantar keratosis submetatarsal 2 of the left foot and corns between the third and fourth toes bilaterally. Prominent plantarflexed metatarsals are present and contracture of digits are seen.  Assessment: Soft tissue corns and calluses bilateral  Plan: Recommended orthotic inserts to try and offload the areas of discomfort submetatarsal 2 of the left foot. She was scanned at today's visit. We will make accommodative insert that she can wear with her steel toe shoes. Besides the orthotics  If she would like to consider surgery I will recommend she speak with Dr. Milinda Pointer or Dr. Paulla Dolly I will see her back when orthotics are ready for pick up.

## 2013-10-28 ENCOUNTER — Other Ambulatory Visit: Payer: Self-pay | Admitting: Neurology

## 2013-11-15 ENCOUNTER — Encounter: Payer: Self-pay | Admitting: Neurology

## 2013-11-15 ENCOUNTER — Ambulatory Visit (INDEPENDENT_AMBULATORY_CARE_PROVIDER_SITE_OTHER): Payer: BC Managed Care – PPO | Admitting: Neurology

## 2013-11-15 VITALS — BP 121/74 | HR 90 | Ht 66.0 in | Wt 161.0 lb

## 2013-11-15 DIAGNOSIS — G43019 Migraine without aura, intractable, without status migrainosus: Secondary | ICD-10-CM

## 2013-11-15 DIAGNOSIS — G43719 Chronic migraine without aura, intractable, without status migrainosus: Secondary | ICD-10-CM

## 2013-11-15 DIAGNOSIS — I2510 Atherosclerotic heart disease of native coronary artery without angina pectoris: Secondary | ICD-10-CM

## 2013-11-15 MED ORDER — ONABOTULINUMTOXINA 100 UNITS IJ SOLR: 150.0000 [IU] | Freq: Once | INTRAMUSCULAR | Status: DC

## 2013-11-15 MED ORDER — SUMATRIPTAN SUCCINATE 6 MG/0.5ML ~~LOC~~ SOSY: 6.0000 mg | PREFILLED_SYRINGE | SUBCUTANEOUS | Status: DC | PRN

## 2013-11-15 NOTE — Progress Notes (Signed)
History of present illness:  Ms. Stacy Fernandez is a 53 year old right-handed white female referred by Dr. Jannifer Franklin for Botox injection as migraine prevention  She had frequent headaches since May 2013, she woke up in May, notice left-sided headache, across her left forehead, feel likes that her left eye is going to pop out, she has no visual loss, first severe headache lasts about 2 days, tried numerous over-the-counter medications, including Excedrin Migraine, Tylenol, Aleve without help,  Ever since the initial episode, she has frequent headaches, most on the left side, occasionally on the right side, excruciating pounding headache, with associated light noise sensitivity, nauseous, she often woke up from sleep with headaches  CT head without contrast was normal, MRI cervical showed mild spondylitic disease, but no acute lesions MRI of the brain done previously was normal. 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    Over the past 2 years, she has tried different preventative medications this including propranolol, Topamax, Effexor, without helping,  She still has 6 headaches in a week , moderately severe, lasting for few days  UPDATE April 29th, 2015:  She received last Botox injection as migraine prevention in Jan 2015, responded very well,  She suffered a severe episode of upper respiratory infection in the past couple few weeks, now complains of increased frequency of headaches, had 5 headaches in one week.  She denies significant side effect from previous Botox injection, it did help her significantly in the first 2 months,   Past Medical History  Diagnosis Date  . Hypothyroidism   . Shortness of breath     2D ECHO, 04/20/2012 - EF >55%, normal; EXERCISE STRESS TEST, 04/20/2012 - normal, EKG negative  for ischemia, no ECG changes  . COPD (chronic obstructive pulmonary disease)   . Emphysema   . Migraine   . Hematuria     Past Surgical History  Procedure Laterality Date  . Cholecystectomy    . Abdominal hysterectomy    . Appendectomy    . Endo wrap    . Video bronchoscopy Bilateral 01/16/2013    Procedure: VIDEO BRONCHOSCOPY WITHOUT FLUORO;  Surgeon: [name], MD;  Location: [facility];  Service: Cardiopulmonary;  Laterality: Bilateral;    Family History  Problem Relation Age of Onset  . Heart failure Mother   . Colon cancer Father   . Throat cancer Brother   . Heart attack Maternal Grandmother   . Lung cancer Maternal Grandfather   . Colon cancer Paternal Grandmother   . Cancer Paternal Grandfather     kidney and bladder  . Lung cancer Brother   . Throat cancer Brother   . Lung cancer Brother     Social history:  reports that she has been smoking Cigarettes.  She has a 18.5 pack-year smoking history. She uses smokeless tobacco. She reports that she does not drink alcohol or use illicit drugs.  Medications:  Current Outpatient Prescriptions on File Prior to Visit  Medication Sig Dispense Refill  . aspirin 81 MG tablet Take 1 tablet (81 mg total) by mouth daily.  30 tablet  0  . gabapentin (NEURONTIN) 100 MG capsule Take 200 mg by mouth at bedtime.       Marland Kitchen ketorolac (TORADOL) 10 MG tablet Take 1 tablet (10 mg total) by mouth every 6 (six) hours as needed (headache).  30 tablet  0  . levothyroxine (SYNTHROID, LEVOTHROID) 75 MCG tablet Take 75 mcg by mouth daily before breakfast.      . mometasone-formoterol (DULERA) 100-5 MCG/ACT AERO Inhale 2 puffs into the lungs 2 (two) times daily.      . nitroGLYCERIN (NITROSTAT) 0.4 MG SL tablet Place 0.4 mg under the tongue every 5 (five) minutes as needed for chest pain.      Marland Kitchen PROAIR HFA 108 (90 BASE) MCG/ACT inhaler Inhale 2 puffs into the lungs every 4 (four) hours as needed.      Marland Kitchen tiZANidine (ZANAFLEX) 2 MG tablet Take 1  tablet (2 mg total) by mouth 2 (two) times daily.  60 tablet  2  . venlafaxine XR (EFFEXOR-XR) 150 MG 24 hr capsule Take 1 capsule (150 mg total) by mouth daily.  90 capsule  1  . verapamil (CALAN SR) 120 MG CR tablet Take 1 tablet (120 mg total) by mouth at bedtime.  90 tablet  2   No current facility-administered medications on file prior to visit.    Allergies:  Allergies  Allergen Reactions  . Contrast Media [Iodinated Diagnostic Agents]     Heart stops and breathing stops    ROS:  Out of a complete 14 system review of symptoms, the patient complains only of the following symptoms, and all other reviewed systems are negative.  Headaches  Blood pressure 121/74, pulse 90, height 5\' 6"  (1.676 m), weight 161 lb (73.029 kg).  PHYSICAL EXAMINATOINS:  Generalized: In no acute distress  Neck: Supple, no carotid bruits   Cardiac: Regular rate rhythm  Pulmonary: Clear to auscultation bilaterally  Musculoskeletal: No deformity  Neurological examination  Mentation: Alert oriented to time, place, history taking, and causual conversation  Cranial nerve II-XII: Pupils were equal round reactive to light extraocular movements were full, visual field were full on confrontational test. facial sensation and strength were normal. hearing was intact to finger rubbing bilaterally. Uvula tongue midline.  head turning and shoulder shrug and were normal and symmetric.Tongue protrusion into cheek strength was normal.  Motor: normal tone, bulk and strength.  Sensory: Intact to fine touch, pinprick, preserved vibratory sensation, and proprioception at toes.  Coordination: Normal finger to nose, heel-to-shin bilaterally there was no truncal ataxia  Gait: Rising up from seated position without assistance, normal stance, without trunk ataxia, moderate stride, good arm swing, smooth turning, able to perform tiptoe, and heel walking without difficulty.   Romberg signs: Negative  Deep tendon  reflexes: Brachioradialis 2/2, biceps 2/2, triceps 2/2, patellar 2/2, Achilles 2/2, plantar responses were flexor bilaterally.  Assessment/Plan:  53 years old Caucasian female, with frequent headaches, with migraine features, came in for Botox injection as migraine prevention. BOTOX injection was performed according to protocol by [employer]. 100 units of BOTOX /2 cc NS.  Total of 150 units   Corrugator 2 sites, 5 units Temporalis 8 sites,  40 units  Occipitalis 6 sites, 30 units Cervical Paraspinal, 4 sites, 20 units Trapezius, 8 sites, 40 units  Extra cervical paraspinals 15 units  Patient tolerate the injection well. Will return for repeat injection in 3 months.

## 2013-11-24 ENCOUNTER — Other Ambulatory Visit: Payer: Self-pay | Admitting: Neurology

## 2013-12-15 ENCOUNTER — Encounter: Payer: Self-pay | Admitting: Podiatrist

## 2014-02-16 ENCOUNTER — Other Ambulatory Visit: Payer: Self-pay | Admitting: Nurse Practitioner

## 2014-02-16 DIAGNOSIS — N644 Mastodynia: Secondary | ICD-10-CM

## 2014-02-17 DIAGNOSIS — C50912 Malignant neoplasm of unspecified site of left female breast: Secondary | ICD-10-CM

## 2014-02-17 HISTORY — DX: Malignant neoplasm of unspecified site of left female breast: C50.912

## 2014-02-22 ENCOUNTER — Other Ambulatory Visit: Payer: Self-pay | Admitting: Radiology

## 2014-02-23 ENCOUNTER — Other Ambulatory Visit: Payer: Self-pay | Admitting: Radiology

## 2014-02-23 DIAGNOSIS — D0592 Unspecified type of carcinoma in situ of left breast: Secondary | ICD-10-CM

## 2014-02-26 ENCOUNTER — Telehealth: Payer: Self-pay | Admitting: *Deleted

## 2014-02-26 DIAGNOSIS — Z171 Estrogen receptor negative status [ER-]: Secondary | ICD-10-CM | POA: Insufficient documentation

## 2014-02-26 DIAGNOSIS — C50412 Malignant neoplasm of upper-outer quadrant of left female breast: Secondary | ICD-10-CM | POA: Insufficient documentation

## 2014-02-26 NOTE — Telephone Encounter (Signed)
Confirmed [facility] for 03/07/14 at 8am .  Instructions and contact information given.

## 2014-02-28 ENCOUNTER — Ambulatory Visit
Admission: RE | Admit: 2014-02-28 | Discharge: 2014-02-28 | Disposition: A | Discharge status: Home / Self Care | Payer: No Typology Code available for payment source | Source: Ambulatory Visit | Attending: Radiology | Admitting: Radiology

## 2014-02-28 DIAGNOSIS — D0592 Unspecified type of carcinoma in situ of left breast: Secondary | ICD-10-CM

## 2014-02-28 MED ORDER — GADOBENATE DIMEGLUMINE 529 MG/ML IV SOLN
13.0000 mL | Freq: Once | INTRAVENOUS | Status: AC | PRN
  Administered 2014-02-28: 13 mL via INTRAVENOUS

## 2014-03-01 ENCOUNTER — Other Ambulatory Visit: Payer: Self-pay | Admitting: Radiology

## 2014-03-01 DIAGNOSIS — R928 Other abnormal and inconclusive findings on diagnostic imaging of breast: Secondary | ICD-10-CM

## 2014-03-02 ENCOUNTER — Ambulatory Visit: Payer: BC Managed Care – PPO | Admitting: Neurology

## 2014-03-07 ENCOUNTER — Ambulatory Visit
Admission: RE | Admit: 2014-03-07 | Discharge: 2014-03-07 | Disposition: A | Discharge status: Home / Self Care | Payer: BC Managed Care – PPO | Source: Ambulatory Visit | Attending: Radiation Oncology | Admitting: Radiation Oncology

## 2014-03-07 ENCOUNTER — Telehealth (INDEPENDENT_AMBULATORY_CARE_PROVIDER_SITE_OTHER): Payer: Self-pay

## 2014-03-07 ENCOUNTER — Other Ambulatory Visit (HOSPITAL_BASED_OUTPATIENT_CLINIC_OR_DEPARTMENT_OTHER): Payer: BC Managed Care – PPO

## 2014-03-07 ENCOUNTER — Ambulatory Visit (HOSPITAL_BASED_OUTPATIENT_CLINIC_OR_DEPARTMENT_OTHER): Payer: BC Managed Care – PPO | Admitting: Genetic Counselor

## 2014-03-07 ENCOUNTER — Ambulatory Visit (HOSPITAL_BASED_OUTPATIENT_CLINIC_OR_DEPARTMENT_OTHER): Payer: BC Managed Care – PPO

## 2014-03-07 ENCOUNTER — Encounter: Payer: Self-pay | Admitting: Oncology

## 2014-03-07 ENCOUNTER — Ambulatory Visit (HOSPITAL_BASED_OUTPATIENT_CLINIC_OR_DEPARTMENT_OTHER): Payer: BC Managed Care – PPO | Admitting: Oncology

## 2014-03-07 ENCOUNTER — Ambulatory Visit (HOSPITAL_BASED_OUTPATIENT_CLINIC_OR_DEPARTMENT_OTHER): Payer: BC Managed Care – PPO | Admitting: General Surgery

## 2014-03-07 ENCOUNTER — Encounter (INDEPENDENT_AMBULATORY_CARE_PROVIDER_SITE_OTHER): Payer: Self-pay | Admitting: General Surgery

## 2014-03-07 ENCOUNTER — Encounter: Payer: Self-pay | Admitting: *Deleted

## 2014-03-07 ENCOUNTER — Ambulatory Visit: Payer: BC Managed Care – PPO

## 2014-03-07 ENCOUNTER — Encounter: Payer: Self-pay | Admitting: Dietician

## 2014-03-07 VITALS — BP 115/71 | HR 86 | Temp 98.2°F | Resp 20 | Ht 66.5 in | Wt 154.3 lb

## 2014-03-07 DIAGNOSIS — C50412 Malignant neoplasm of upper-outer quadrant of left female breast: Secondary | ICD-10-CM

## 2014-03-07 DIAGNOSIS — G4700 Insomnia, unspecified: Secondary | ICD-10-CM

## 2014-03-07 DIAGNOSIS — Z803 Family history of malignant neoplasm of breast: Secondary | ICD-10-CM | POA: Insufficient documentation

## 2014-03-07 DIAGNOSIS — Z8041 Family history of malignant neoplasm of ovary: Secondary | ICD-10-CM | POA: Insufficient documentation

## 2014-03-07 DIAGNOSIS — C50419 Malignant neoplasm of upper-outer quadrant of unspecified female breast: Secondary | ICD-10-CM

## 2014-03-07 DIAGNOSIS — C50919 Malignant neoplasm of unspecified site of unspecified female breast: Secondary | ICD-10-CM

## 2014-03-07 DIAGNOSIS — Z800 Family history of malignant neoplasm of digestive organs: Secondary | ICD-10-CM | POA: Insufficient documentation

## 2014-03-07 DIAGNOSIS — F17200 Nicotine dependence, unspecified, uncomplicated: Secondary | ICD-10-CM

## 2014-03-07 DIAGNOSIS — IMO0002 Reserved for concepts with insufficient information to code with codable children: Secondary | ICD-10-CM

## 2014-03-07 DIAGNOSIS — Z720 Tobacco use: Secondary | ICD-10-CM

## 2014-03-07 LAB — CBC WITH DIFFERENTIAL/PLATELET
BASO%: 1 % (ref 0.0–2.0)
Basophils Absolute: 0.1 10*3/uL (ref 0.0–0.1)
EOS%: 1.1 % (ref 0.0–7.0)
Eosinophils Absolute: 0.1 10*3/uL (ref 0.0–0.5)
HEMATOCRIT: 36.1 % (ref 34.8–46.6)
HGB: 12.5 g/dL (ref 11.6–15.9)
LYMPH#: 1.4 10*3/uL (ref 0.9–3.3)
LYMPH%: 24.7 % (ref 14.0–49.7)
MCH: 30.8 pg (ref 25.1–34.0)
MCHC: 34.5 g/dL (ref 31.5–36.0)
MCV: 89.4 fL (ref 79.5–101.0)
MONO#: 0.4 10*3/uL (ref 0.1–0.9)
MONO%: 6.3 % (ref 0.0–14.0)
NEUT%: 66.9 % (ref 38.4–76.8)
NEUTROS ABS: 3.9 10*3/uL (ref 1.5–6.5)
Platelets: 233 10*3/uL (ref 145–400)
RBC: 4.04 10*6/uL (ref 3.70–5.45)
RDW: 13 % (ref 11.2–14.5)
WBC: 5.8 10*3/uL (ref 3.9–10.3)

## 2014-03-07 LAB — COMPREHENSIVE METABOLIC PANEL (CC13)
ALT: 13 U/L (ref 0–55)
ANION GAP: 10 meq/L (ref 3–11)
AST: 15 U/L (ref 5–34)
Albumin: 3.9 g/dL (ref 3.5–5.0)
Alkaline Phosphatase: 108 U/L (ref 40–150)
BUN: 15.1 mg/dL (ref 7.0–26.0)
CO2: 22 meq/L (ref 22–29)
CREATININE: 1 mg/dL (ref 0.6–1.1)
Calcium: 9.3 mg/dL (ref 8.4–10.4)
Chloride: 109 mEq/L (ref 98–109)
Glucose: 96 mg/dl (ref 70–140)
Potassium: 3.9 mEq/L (ref 3.5–5.1)
SODIUM: 141 meq/L (ref 136–145)
TOTAL PROTEIN: 7.3 g/dL (ref 6.4–8.3)
Total Bilirubin: 0.24 mg/dL (ref 0.20–1.20)

## 2014-03-07 MED ORDER — TRAZODONE HCL 50 MG PO TABS: 50.0000 mg | ORAL_TABLET | Freq: Every day | ORAL | Status: DC

## 2014-03-07 NOTE — Progress Notes (Signed)
HISTORY OF PRESENT ILLNESS: Dr. Modena Morrow requested a cancer genetics consultation for Stacy Fernandez, a 53 y.o. female, due to a personal and family history of cancer.  Stacy Fernandez presents to clinic today to discuss the possibility of a hereditary predisposition to cancer, genetic testing, and to further clarify her future cancer risks, as well as potential cancer risk for family members. Stacy Fernandez was recently diagnosed with left breast cancer at age 47. She is planning surgery and adjuvant chemotherapy. Her surgery date is still pending as she would like to use BRCA genetic test results for surgical decisions. She has no history of other cancer. She has had a TAH-BSO due to benign reasons.   Past Medical History  Diagnosis Date   Hypothyroidism    Shortness of breath     2D ECHO, 04/20/2012 - EF >55%, normal; EXERCISE STRESS TEST, 04/20/2012 - normal, EKG negative for ischemia, no ECG changes   COPD (chronic obstructive pulmonary disease)    Emphysema    Migraine    Hematuria    Brain aneurysm    Hot flashes     Past Surgical History  Procedure Laterality Date   Cholecystectomy     Abdominal hysterectomy     Appendectomy     Endo wrap     Video bronchoscopy Bilateral 01/16/2013    Procedure: VIDEO BRONCHOSCOPY WITHOUT FLUORO;  Surgeon: [name], MD;  Location: [facility];  Service: Cardiopulmonary;  Laterality: Bilateral;    History   Social History   Marital Status: Married    Spouse Name: N/A    Number of Children: 4   Years of Education: GED   Occupational History       Social History Main Topics   Smoking status: Current Every Day Smoker -- 1.00 packs/day for 37 years    Types: Cigarettes   Smokeless tobacco: Current User     Comment: uses e-cig 01/03/13, starting using Chantix 11/13/13   Alcohol Use: No   Drug Use: No   Sexual Activity: Not on file   Other Topics Concern   Not on file   Social History Narrative   Patient lives at home  with her husband Stacy Fernandez) Patient works full time.   Education- GED   Right handed.   Caffeine- one cup of coffee daily.     FAMILY HISTORY:  During the visit, a 4-generation pedigree was obtained. Significant diagnoses include the following:  Family History  Problem Relation Age of Onset   Heart failure Mother    Colon cancer Father 77    stomach cancer also in 102s   Throat cancer Brother 39    smoker   Heart attack Maternal Grandmother    Colon cancer Paternal Grandmother 30   Cancer Paternal Grandfather     kidney and bladder   Throat cancer Brother 54    throat cancer, smoker   Ovarian cancer Sister 83    ovarian cancer at 65, colorectal cancer at 21   Breast cancer Paternal Aunt 11   Ovarian cancer Other 27    niece with ovarian cancer    Stacy Fernandez's ancestry is of Caucasian descent. There is no known Jewish ancestry or consanguinity.  GENETIC COUNSELING ASSESSMENT: Stacy Fernandez is a 53 y.o. female with a personal and family history of cancer suggestive of a hereditary predisposition to cancer. We, therefore, discussed and recommended the following at today's visit.   DISCUSSION: We reviewed the characteristics, features and inheritance patterns of hereditary cancer syndromes.  We also discussed genetic testing, including the appropriate family members to test, the process of testing, insurance coverage and turn-around-time for results. We discussed the implications of a negative, positive and/or variant of uncertain significant result. We recommended Stacy Fernandez pursue genetic testing for BRCA1 and BCRA2. We also recommended genetic testing for the OvaNext gene panel due to the possibility of Lynch syndrome in this family.   PLAN: Based on our above recommendation, Stacy Fernandez wished to pursue genetic testing and the blood sample was drawn and will be sent to [facility] for analysis. Results for BCRA are typically available within approximately 2-3 weeks time,  at which point they will be disclosed by telephone to Stacy Fernandez, as will any additional recommendations warranted by these results. However, we have asked the lab to rush the BRCA results for surgical decisions. The OvaNext gene panel typically takes 4-6 weeks. We also encouraged Stacy Fernandez to remain in contact with cancer genetics annually so that we can continuously update the family history and inform her of any changes in cancer genetics and testing that may be of benefit for this family. Ms.  Fernandez questions were answered to her satisfaction today. Our contact information was provided should additional questions or concerns arise.   Thank you for the referral and allowing [place] to share in the care of your patient.   The patient was seen for a total of 45 minutes in face-to-face genetic counseling.  This patient was discussed with Dr. Jana Hakim who agrees with the above.    _______________________________________________________________________ For Office Staff:  Number of people involved in session: 2 Was an Intern/ student involved with case: not applicable

## 2014-03-07 NOTE — Progress Notes (Signed)
[place] Psychosocial Distress Screening Clinical Social Work  Patient completed distress screening protocol, and scored a 8 on the Psychosocial Distress Thermometer which indicates severe distress. Clinical Social Worker met with patient and patient's family in [facility] to assess for distress and other psychosocial needs.  Patient was tearful and expressed feeling overwhelmed by her diagnosis and treatment plan.  Patient has a long family history of cancer and has lost many family members as a result.  CSW validated patients feeling and provided a space to process her emotions.  CSW and patient discussed taking treatment one step at a time and focusing on self care.  Patient reported that she usually takes on the role of the caregiver.  CSW and patient discussed the importance of emotional support. CSW informed patient of the support services and support team at [facility].  CSW provided patient with contact information and encouraged her to call with any questions or concerns.        ONCBCN DISTRESS SCREENING 03/07/2014  Screening Type Initial Screening  Elta [place] the number that describes how much distress you have been experiencing in the past week 8  Practical problem type Work/school  Emotional problem type Nervousness/Anxiety;Adjusting to illness;Isolation/feeling alone  Information Concerns Type Lack of info about diagnosis;Lack of info about treatment  Physical Problem type Pain;Nausea/vomiting;Sleep/insomnia;Loss of appetitie  Physician notified of physical symptoms Yes  Referral to clinical psychology No  Referral to clinical social work Yes  Referral to dietition No  Referral to financial advocate No  Referral to support programs No  Referral to palliative care No   Johnnye Lana, MSW, LCSW, OSW-C Clinical Social Worker [place] [phone]

## 2014-03-07 NOTE — Progress Notes (Unsigned)
Patient was seen by RD during [facility] on 03/07/2014  Provided pt with folder of educational materials regarding general nutrition recommendations for breast cancer patients, plant-based diets, antioxidants, cancer facts vs myths, and information on organic foods  Explained importance of healthy nutrition during treatments and encouraged pt to consume daily recommended amount of fruits and vegetables, emphasizing variety of intake for maximum antioxidant and synergistic health benefits. Promoted adequate fiber intake, with use of whole grain and whole wheat products, beans, and lentils. Encouraged patient to follow a low fat diet with use of heart healthy fats, and to opt for plant-based proteins weekly  Recommended pt maintain healthy weight during treatments, and encouraged gradual weight loss as warranted after procedures.  Diet recall indicated pt has been experiencing decreased appetite, which pt attributes to anxiety/restlessness. Pt noted that her doctor is going to provide her with medication to assist with resolving her anxiousness. Pt consuming snacks for lunch-trail mix, fruit, yogurt. Husband cooks dinner, and encourages pt to consume balanced meal of protein/vegetable/starch. Encouraged pt to trial [employer] or smoothies for breakfast as way to increase fruit/vegetable intake, an ensure proper nutrient intake.   Patient had questions regarding possible snacks and smoothie ideas. Encouraged intake of trail mixes that included almonds and/or walnuts, and discussed sample smoothie recipes that included alternative plant based proteins. Pt reported they consume minimal red meats/processed foods; however she was growing tired of consuming chicken/turkey for protein. Reviewed different ways to incorporate dairy products and/or beans/lentils into snacks and meals  Expect fair compliance. Pt willing to try to consume breakfast smoothie or supplement and was going to  consider alternative snacks that allowed her to meet the recommended daily serving for fruits and vegetables  Provided pt with outpatient oncology RD contact information. Encouraged pt to contact RD with additional follow up questions or nutrition-related concerns.  Atlee Abide MS RD LDN Clinical Dietitian [phone]

## 2014-03-07 NOTE — Progress Notes (Signed)
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 She brought it to her primary physician's attention him a and on 02/21/2014 she underwent bilateral diagnostic mammography with tomography at [facility]. The breast composition was category B. In the left breast at the 3:00 position there was an irregular mass associated with skin retraction. Ultrasound performed on the same day confirmed a 1.1 cm irregular solid mass in the left breast at the 3:00 position. There were no abnormalities by sonography in the left axilla or in the superior portion of the breast, which is for the patient experiences some tenderness.  Biopsy of the left breast area in question 02/22/2014 showed ([id number]) and invasive ductal carcinoma, grade 3, estrogen and progesterone receptor negative, with an MIB-1 of 83%, and HER-2 amplified, the signals ratio being 2.07, and a copy number per cell 4.75.  On 02/28/2014 the patient underwent bilateral breast MRI, which showed in the posterior third of the left breast at the 3:00 position an irregular spiculated mass measuring 1.8 cm. The left breast was unremarkable and there were no abnormal appearing lymph nodes.  The patient's subsequent history is as detailed below  INTERVAL HISTORY: Stacy Fernandez was evaluated in the multidisciplinary breast  cancer clinic eighth 19 2015 accompanied by her husband Stacy Fernandez, her daughter Stacy Fernandez, and a granddaughter. Her case was also discussed in the multidisciplinary breast cancer conference that same morning.  REVIEW OF SYSTEMS: Stacy Fernandez continues to experience some soreness in the upper outer quadrant area of the left breast. This actually may be musculoskeletal and related to her work, which involves heavy lifting. She complains of chills and night sweats, loss of sleep, fatigue, problems with her dentures, hoarseness, ankle swelling, shortness of breath when walking up a slope or uphill, a dry cough which is usually nonproductive, abdominal pain with some loose bowel movements recently, but no blood in her stool but she is aware of, some stress urinary incontinence, history of urinary tract infections, history of hematuria, severe migraines, and hot flashes. A detailed review of systems was otherwise noncontributory   PAST MEDICAL HISTORY: Past Medical History  Diagnosis Date  . Hypothyroidism   . Shortness of breath     2D ECHO, 04/20/2012 - EF >55%, normal; EXERCISE STRESS TEST, 04/20/2012 - normal, EKG negative for ischemia, no ECG changes  . COPD (chronic obstructive pulmonary disease)   . Emphysema   . Migraine   . Hematuria   . Brain aneurysm   . Hot flashes     PAST SURGICAL HISTORY: Past Surgical History  Procedure Laterality Date  . Cholecystectomy    . Abdominal hysterectomy    . Appendectomy    . Endo wrap    . Video bronchoscopy Bilateral 01/16/2013    Procedure: VIDEO BRONCHOSCOPY WITHOUT FLUORO;  Surgeon: Stacy Noel, MD;  Location: [facility];  Service: Cardiopulmonary;  Laterality: Bilateral;    FAMILY HISTORY Family History  Problem Relation Age of Onset  .  Heart failure Mother   . Colon cancer Father   . Throat cancer Brother   . Heart attack Maternal Grandmother   . Lung cancer Maternal Grandfather   . Colon cancer Paternal Grandmother   . Cancer Paternal Grandfather      kidney and bladder  . Lung cancer Brother   . Throat cancer Brother   . Lung cancer Brother    the patient's father died at the age of 23 from metastatic stomach cancer the patient's father's mother died from colon cancer at the age of 72. The patient's mother died at the age of 36. The patient had 2 brothers and 2 sisters. One brother died at the age of 60 from throat cancer metastatic to the lung. He was a smoker. A second brother Was diagnosed at age 42 with throat cancer metastatic to lung. He has been given 90 days to live" is not looking good". One sister died from colon cancer metastatic to bone. One sister died of a drug overdose. There is no history of breast or bearing cancer in the family to the patient's knowledge.  GYNECOLOGIC HISTORY:  No LMP recorded. Patient has had a hysterectomy. Menarche age 29, first live birth age 8, the patient is [place] P4. She underwent hysterectomy with bilateral salpingo-oophorectomy approximately 1990. She took hormone replacement for approximately 2 years.  SOCIAL HISTORY:      ADVANCED DIRECTIVES:    HEALTH MAINTENANCE: History  Substance Use Topics  . Smoking status: Current Every Day Smoker -- 1.00 packs/day for 37 years    Types: Cigarettes  . Smokeless tobacco: Current User     Comment: uses e-cig 01/03/13, starting using Chantix 11/13/13  . Alcohol Use: No     Colonoscopy: 2013/Dr. Mann  PAP: May 2013  Bone density:  Lipid panel:  Allergies  Allergen Reactions  . Contrast Media [Iodinated Diagnostic Agents] Anaphylaxis    Heart stops and breathing stops    Current Outpatient Prescriptions  Medication Sig Dispense Refill  . atorvastatin (LIPITOR) 10 MG tablet Take 10 mg by mouth daily.      . budesonide-formoterol (SYMBICORT) 160-4.5 MCG/ACT inhaler Inhale 2 puffs into the lungs 2 (two) times daily.      Marland Kitchen ketorolac (TORADOL) 10 MG tablet TAKE 1 TABLET BY MOUTH 3 TIMES A DAY AS NEEDED  30 tablet  2  . levothyroxine  (SYNTHROID, LEVOTHROID) 75 MCG tablet Take 75 mcg by mouth daily before breakfast.      . montelukast (SINGULAIR) 10 MG tablet       . PROAIR HFA 108 (90 BASE) MCG/ACT inhaler Inhale 2 puffs into the lungs every 4 (four) hours as needed.      . rizatriptan (MAXALT-MLT) 5 MG disintegrating tablet Take 1 tablet (5 mg total) by mouth as needed for migraine. May repeat in 2 hours if needed  15 tablet  12  . SUMAtriptan (IMITREX) 6 MG/0.5ML SOSY injection Inject 0.5 mLs (6 mg total) into the skin every 2 (two) hours as needed for migraine or headache. F  15 Syringe  12  . tiZANidine (ZANAFLEX) 2 MG tablet TAKE 1 TABLET (2 MG TOTAL) BY MOUTH 2 (TWO) TIMES DAILY.  60 tablet  2  . venlafaxine XR (EFFEXOR-XR) 150 MG 24 hr capsule Take 1 capsule (150 mg total) by mouth daily.  90 capsule  1  . verapamil (CALAN-SR) 120 MG CR tablet TAKE 1 TABLET BY MOUTH AT BEDTIME  90 tablet  3  . nitroGLYCERIN (NITROSTAT) 0.4 MG  SL tablet Place 0.4 mg under the tongue every 5 (five) minutes as needed for chest pain.       Current Facility-Administered Medications  Medication Dose Route Frequency Provider Last Rate Last Dose  . botulinum toxin Type A (BOTOX) injection 150 Units  150 Units Intramuscular Once Marcial Pacas, MD        OBJECTIVE: Middle-aged white woman who appears older than stated age 41 Vitals:   03/07/14 0840  BP: 115/71  Pulse: 86  Temp: 98.2 F (36.8 C)  Resp: 20     Body mass index is 24.53 kg/(m^2).    ECOG FS:1 - Symptomatic but completely ambulatory  Ocular: Sclerae unicteric, pupils equal, round and reactive to light Ear-nose-throat: Oropharynx clear and moist Lymphatic: No cervical or supraclavicular adenopathy Lungs no rales or rhonchi, good excursion bilaterally Heart regular rate and rhythm, no murmur appreciated Abd soft, nontender, positive bowel sounds MSK no focal spinal tenderness, no upper extremity edema; no masses or other abnormalities noted in the upper anterior left chest  which is the area of tenderness Neuro: non-focal, well-oriented, appropriate affect Breasts: The right breast is unremarkable. Left breast is status post recent biopsy, with a small ecchymosis. I do not palpate a well-defined mass, but there is a slight wrinkling and dimpling of the skin in the 3:00 position. I do not palpate any axillary lymph nodes on the left   LAB RESULTS:  CMP     Component Value Date/Time   NA 141 03/07/2014 0813   NA 139 01/05/2013 1830   K 3.9 03/07/2014 0813   K 4.4 01/05/2013 1830   CL 104 01/05/2013 1830   CO2 22 03/07/2014 0813   CO2 27 01/05/2013 1830   GLUCOSE 96 03/07/2014 0813   GLUCOSE 89 01/05/2013 1830   BUN 15.1 03/07/2014 0813   BUN 15 01/05/2013 1830   CREATININE 1.0 03/07/2014 0813   CREATININE 0.81 01/05/2013 1830   CALCIUM 9.3 03/07/2014 0813   CALCIUM 9.5 01/05/2013 1830   PROT 7.3 03/07/2014 0813   PROT 5.7* 12/26/2012 0340   ALBUMIN 3.9 03/07/2014 0813   ALBUMIN 3.3* 12/26/2012 0340   AST 15 03/07/2014 0813   AST 50* 12/26/2012 0340   ALT 13 03/07/2014 0813   ALT 26 12/26/2012 0340   ALKPHOS 108 03/07/2014 0813   ALKPHOS 83 12/26/2012 0340   BILITOT 0.24 03/07/2014 0813   BILITOT 0.3 12/26/2012 0340   GFRNONAA 83* 01/05/2013 1830   GFRAA >90 01/05/2013 1830    I No results found for this basename: SPEP,  UPEP,   kappa and lambda light chains    Lab Results  Component Value Date   WBC 5.8 03/07/2014   NEUTROABS 3.9 03/07/2014   HGB 12.5 03/07/2014   HCT 36.1 03/07/2014   MCV 89.4 03/07/2014   PLT 233 03/07/2014      Chemistry      Component Value Date/Time   NA 141 03/07/2014 0813   NA 139 01/05/2013 1830   K 3.9 03/07/2014 0813   K 4.4 01/05/2013 1830   CL 104 01/05/2013 1830   CO2 22 03/07/2014 0813   CO2 27 01/05/2013 1830   BUN 15.1 03/07/2014 0813   BUN 15 01/05/2013 1830   CREATININE 1.0 03/07/2014 0813   CREATININE 0.81 01/05/2013 1830      Component Value Date/Time   CALCIUM 9.3 03/07/2014 0813   CALCIUM 9.5 01/05/2013 1830   ALKPHOS 108  03/07/2014 0813   ALKPHOS 83 12/26/2012 0340  AST 15 03/07/2014 0813   AST 50* 12/26/2012 0340   ALT 13 03/07/2014 0813   ALT 26 12/26/2012 0340   BILITOT 0.24 03/07/2014 0813   BILITOT 0.3 12/26/2012 0340       No results found for this basename: LABCA2    No components found with this basename: [id number]    No results found for this basename: INR,  in the last 168 hours  Urinalysis    Component Value Date/Time   COLORURINE YELLOW 12/25/2012 2225   APPEARANCEUR CLOUDY* 12/25/2012 2225   LABSPEC 1.027 12/25/2012 2225   PHURINE 5.0 12/25/2012 2225   GLUCOSEU NEGATIVE 12/25/2012 2225   HGBUR LARGE* 12/25/2012 [place] 12/25/2012 [place] 12/25/2012 2225   PROTEINUR NEGATIVE 12/25/2012 2225   UROBILINOGEN 0.2 12/25/2012 2225   NITRITE NEGATIVE 12/25/2012 2225   LEUKOCYTESUR TRACE* 12/25/2012 2225    STUDIES: Mr Breast Bilateral W Wo Contrast  03/07/2014   ADDENDUM REPORT: 03/07/2014 10:49  ADDENDUM: In the body of the report, Right breast: should have been Left breast: and vise versa. The 1.8 cm mass and a linear enhancement is in the left breast.   Electronically Signed   By: Lillia Mountain M.D.   On: 03/07/2014 10:49   03/07/2014   CLINICAL DATA:  53 year old female with biopsy proven invasive ductal carcinoma in the 3 o'clock region of the left breast.  LABS:  None obtained at the time of MRI.  EXAM: BILATERAL BREAST MRI WITH AND WITHOUT CONTRAST  TECHNIQUE: Multiplanar, multisequence MR images of both breasts were obtained prior to and following the intravenous administration of 81m of MultiHance.  THREE-DIMENSIONAL MR IMAGE RENDERING ON INDEPENDENT WORKSTATION:  Three-dimensional MR images were rendered by post-processing of the original MR data on an independent workstation. The three-dimensional MR images were interpreted, and findings are reported in the following complete MRI report for this study. Three dimensional images were evaluated at the independent DynaCad  workstation  COMPARISON:  Prior mammograms dated 02/21/2014 and 02/10/2012.  FINDINGS: Breast composition: b.  Scattered fibroglandular tissue.  Background parenchymal enhancement: Moderate  Right breast: In the posterior third of the 3 o'clock region of the left breast there is an irregular spiculated mass measuring 1.8 x 1.3 x 1.0 cm. Signal void artifact is seen in the mass from the biopsy clip. 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  Left breast: No mass or abnormal enhancement.  Lymph nodes: No abnormal appearing lymph nodes.  Ancillary findings:  None.  IMPRESSION: 1.8 cm mass in the 3 o'clock region of the left breast corresponding with the known invasive ductal carcinoma. 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  RECOMMENDATION: MR guided core biopsy of the linear enhancement extending anteriorly from the left breast mass is recommended to prove the extent of disease.  BI-RADS CATEGORY  6: Known biopsy-proven malignancy.  Electronically Signed: By: DLillia MountainM.D. On: 02/28/2014 15:56    ASSESSMENT: 53y.o. [place] woman status post left breast biopsy 02/22/2014 for a clinical T2/T3 NX, stage II or 3 invasive ductal carcinoma, grade 3, estrogen and progesterone receptor negative, with an MIB-1 of 83%, and HER-2 amplified with a signals a ratio of 2.07and a copy number per cell of 4.75  (1) biopsy of an additional area of enhancement in the left breast scheduled for 03/08/2014  PLAN: We spent the better  part of today's hour-long appointment discussing the biology of breast cancer in general, and the specifics of the patient's tumor in particular. Stacy Fernandez and her family understand that she has an aggressive and fast growing tumor, which is not estrogen dependent. In terms of systemic therapy, she will need both chemotherapy and anti-HER-2 treatment  The local  therapy decision is more complex. In my opinion it will be difficult for her to avoid a left mastectomy. We are going to obtain a biopsy of the additional suspicious area in the left breast tomorrow and possibly that will help clarify that choice. The patient also will undergo genetics testing, but in my opinion she is very unlikely to be positive for the BRCA one or 2 gene mutations and I don't think those findings will affect her local treatment decisions. Whether or not she will need postmastectomy radiation, if she undergoes a mastectomy, of course will depend on her final pathology.  We are going to obtain an echo and also a PET scan to complete staging on this patient who may well have stage III disease, and who has a significant history of tobacco abuse. Today we did discuss that issue and she was strongly urged to discontinue smoking at least for the next 6 months. If she does undergo mastectomy and desires reconstruction, she understands she will not be able to get that accomplished unless she quits smoking. The patient is very motivated.   She requested something to help her sleep. I am putting in a prescription for trazodone to take at bedtime. Otherwise she will return to see me in approximately 5 weeks. At that time we will discuss the specific choice of chemotherapy as well as the possible side effects, toxicities and complications of both the chemotherapy agents and the anti-HER-2 treatment.  The patient has a good understanding of the overall plan. She agrees with it. She knows the goal of treatment in her case is cure. She will call with any problems that may develop before her next visit here.  Stacy Cruel, MD   03/07/2014 11:36 AM

## 2014-03-07 NOTE — Addendum Note (Signed)
Addended by: [name] on: 03/07/2014 11:58 AM   Modules accepted: Orders

## 2014-03-07 NOTE — Progress Notes (Addendum)
Patient ID: Stacy Fernandez, female   DOB: [birth year], 53 y.o.   MRN: [id number]  No chief complaint on file.   HPI Stacy Fernandez is a 53 y.o. female.  She is referred by Dr. Shelah Lewandowsky [facility] for evaluation and management of an invasive cancer of the left breast laterally at the 9:00 position with possible additional DCIS anterior to that. Dr. Charleston Poot is her PCP. Dr. Elsworth Soho is her pulmonologist. Dr. Floyde Parkins is her neurologist.    She is being evaluated in the [facility] today by Dr. Pablo Ledger, Dr. Jana Hakim, and me  The patient's last mammogram was 2 years ago. She's had some pain and dimpling in the lateral aspect of the left breast for a little while. Recent mammograms and ultrasound show a 1.1 cm mass in the left breast laterally some skin dimpling was noted. Image guided biopsy showed grade 3 invasive ductal carcinoma, HER-2 positive, receptor negative. Subsequent MRI showed a 1.8 cm mass in the left breast at 3:00 position and a 4.5 cm area of additional linear enhancement without mass extending anteriorly from the primary cancer, suspicious for DCIS. Lymph nodes are negative.  Family history reveals ovarian cancer in a sister and a niece. Stomach cancer in her father. THe  sister had colon cancer. Has  head and neck cancer in the family. Nobody has had breast cancer  Comorbidities include aneurysm in the brain followed by Dr. Jannifer Franklin. Migraine headaches necessitating Botox injections from time to time. Coronary artery Disease. COPD. Hypertension. Benign hematuria followed by Dr. Diona Fanti.  Social history reveals that she is married and here with her husband. She smokes cigarettes but denies alcohol.  HPI  Past Medical History  Diagnosis Date  . Hypothyroidism   . Shortness of breath     2D ECHO, 04/20/2012 - EF >55%, normal; EXERCISE STRESS TEST, 04/20/2012 - normal, EKG negative for ischemia, no ECG changes  . COPD (chronic obstructive pulmonary disease)   .  Emphysema   . Migraine   . Hematuria   . Brain aneurysm   . Hot flashes     Past Surgical History  Procedure Laterality Date  . Cholecystectomy    . Abdominal hysterectomy    . Appendectomy    . Endo wrap    . Video bronchoscopy Bilateral 01/16/2013    Procedure: VIDEO BRONCHOSCOPY WITHOUT FLUORO;  Surgeon: [name], MD;  Location: [facility];  Service: Cardiopulmonary;  Laterality: Bilateral;    Family History  Problem Relation Age of Onset  . Heart failure Mother   . Colon cancer Father   . Throat cancer Brother   . Heart attack Maternal Grandmother   . Lung cancer Maternal Grandfather   . Colon cancer Paternal Grandmother   . Cancer Paternal Grandfather     kidney and bladder  . Lung cancer Brother   . Throat cancer Brother   . Lung cancer Brother     Social History History  Substance Use Topics  . Smoking status: Current Every Day Smoker -- 1.00 packs/day for 37 years    Types: Cigarettes  . Smokeless tobacco: Current User     Comment: uses e-cig 01/03/13, starting using Chantix 11/13/13  . Alcohol Use: No    Allergies  Allergen Reactions  . Contrast Media [Iodinated Diagnostic Agents] Anaphylaxis    Heart stops and breathing stops    Current Outpatient Prescriptions  Medication Sig Dispense Refill  . atorvastatin (LIPITOR) 10 MG tablet Take 10 mg by  mouth daily.      . budesonide-formoterol (SYMBICORT) 160-4.5 MCG/ACT inhaler Inhale 2 puffs into the lungs 2 (two) times daily.      Marland Kitchen ketorolac (TORADOL) 10 MG tablet TAKE 1 TABLET BY MOUTH 3 TIMES A DAY AS NEEDED  30 tablet  2  . levothyroxine (SYNTHROID, LEVOTHROID) 75 MCG tablet Take 75 mcg by mouth daily before breakfast.      . montelukast (SINGULAIR) 10 MG tablet       . nitroGLYCERIN (NITROSTAT) 0.4 MG SL tablet Place 0.4 mg under the tongue every 5 (five) minutes as needed for chest pain.      Marland Kitchen PROAIR HFA 108 (90 BASE) MCG/ACT inhaler Inhale 2 puffs into the lungs every 4 (four) hours as needed.       . rizatriptan (MAXALT-MLT) 5 MG disintegrating tablet Take 1 tablet (5 mg total) by mouth as needed for migraine. May repeat in 2 hours if needed  15 tablet  12  . SUMAtriptan (IMITREX) 6 MG/0.5ML SOSY injection Inject 0.5 mLs (6 mg total) into the skin every 2 (two) hours as needed for migraine or headache. F  15 Syringe  12  . tiZANidine (ZANAFLEX) 2 MG tablet TAKE 1 TABLET (2 MG TOTAL) BY MOUTH 2 (TWO) TIMES DAILY.  60 tablet  2  . venlafaxine XR (EFFEXOR-XR) 150 MG 24 hr capsule Take 1 capsule (150 mg total) by mouth daily.  90 capsule  1  . verapamil (CALAN-SR) 120 MG CR tablet TAKE 1 TABLET BY MOUTH AT BEDTIME  90 tablet  3   Current Facility-Administered Medications  Medication Dose Route Frequency Provider Last Rate Last Dose  . botulinum toxin Type A (BOTOX) injection 150 Units  150 Units Intramuscular Once Marcial Pacas, MD        Review of Systems Review of Systems  Constitutional: Negative for fever, chills and unexpected weight change.  HENT: Negative for congestion, hearing loss, sore throat, trouble swallowing and voice change.   Eyes: Negative for visual disturbance.  Respiratory: Positive for shortness of breath. Negative for cough and wheezing.   Cardiovascular: Negative for chest pain, palpitations and leg swelling.  Gastrointestinal: Negative for nausea, vomiting, abdominal pain, diarrhea, constipation, blood in stool, abdominal distention and anal bleeding.  Genitourinary: Positive for hematuria. Negative for vaginal bleeding and difficulty urinating.  Musculoskeletal: Negative for arthralgias.  Skin: Negative for rash and wound.  Neurological: Positive for headaches. Negative for seizures and syncope.  Hematological: Negative for adenopathy. Does not bruise/bleed easily.  Psychiatric/Behavioral: Negative for confusion.    There were no vitals taken for this visit.  Physical Exam Physical Exam  Constitutional: She is oriented to person, place, and time. She  appears well-developed and well-nourished. No distress.  HENT:  Head: Normocephalic and atraumatic.  Nose: Nose normal.  Mouth/Throat: No oropharyngeal exudate.  Eyes: Conjunctivae and EOM are normal. Pupils are equal, round, and reactive to light. Left eye exhibits no discharge. No scleral icterus.  Neck: Neck supple. No JVD present. No tracheal deviation present. No thyromegaly present.  Cardiovascular: Normal rate, regular rhythm, normal heart sounds and intact distal pulses.   No murmur heard. Pulmonary/Chest: Effort normal and breath sounds normal. No respiratory distress. She has no wheezes. She has no rales. She exhibits no tenderness.  Bra size 38C.  Left breast reveals a little bit of skin dimpling at the 3:30 position but the skin otherwise looks healthy. There is a 1.5 cm tender mass at the 3:00 position of the left breast.  I do not  see any other skin changes feel any other masses on either side. There is no axillary adenopathy.  Abdominal: Soft. Bowel sounds are normal. She exhibits no distension and no mass. There is no tenderness. There is no rebound and no guarding.  Multiple laparoscopy scars. Pfannenstiel incision. No mass.  Musculoskeletal: She exhibits no edema and no tenderness.  Lymphadenopathy:    She has no cervical adenopathy.  Neurological: She is alert and oriented to person, place, and time. She exhibits normal muscle tone. Coordination normal.  Skin: Skin is warm. No rash noted. She is not diaphoretic. No erythema. No pallor.  Psychiatric: She has a normal mood and affect. Her behavior is normal. Judgment and thought content normal.    Data Reviewed I reviewed all of her imaging studies, histology, and breast diagnostic profile. I reviewed her case at breast conference this morning. I have coordinated her care with Dr. Jana Hakim and Dr. Pablo Ledger.  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  Clinical stage TI C., NX  Suspicious non-mass enhancing extending anteriorly from the primary tumor, suspicious but not diagnostic for DCIS.  Migraine headaches  Coronary artery disease  COPD  Ongoing tobacco use  Hypertension  Cerebral aneurysm  Benign hematuria  Strong family history for multiple cancers     Plan    She will be seen by genetic counseling today, and hopefully have blood drawn for  testing. At this time she states that the results of her genetic testing will not seriously influence her decision about extent of  surgery.  We spent a long time talking about need for second biopsy if lumpectomy is desired, whether a  lumpectomy will be possible if she has more extensive disease and cosmetic consequences of that, mastectomy, sentinel node biopsy. Her desires are to simply choose the best surgical treatment that gives her the best chance of controlling her disease and she is open minded about these decisions..  She is scheduled for MRI guided biopsy of the anterior extent of the linear enhancement in the left breast tomorrow  She knows that she is going to be offered adjuvant herceptin and she is comfortable with that. She knows that she will need a Port-A-Cath and we discussed that.  She knows that we would like to proceed with surgery first and adjuvant treatments after that and she comfortable with that  She will return to see me in one week following the second biopsy for final surgical decision-making.        Edsel Petrin. Dalbert Batman, M.D., [facility] General and Minimally invasive Surgery Breast and Colorectal Surgery Office:   [phone] Pager:   [phone]  03/07/2014, 10:33 AM

## 2014-03-07 NOTE — Telephone Encounter (Signed)
appt with Dr. Dalbert Batman [id number]@4p  to discuss plan ,date/  time could change due to Dr. Dalbert Batman in [place] next week

## 2014-03-07 NOTE — Patient Instructions (Signed)
You have been diagnosed with an invasive cancer of the left breast, laterally and posteriorly. Your MRI also shows an area of enhancement that trails anteriorly and may or may not represent noninvasive cancer. All of your lymph nodes looked normal. Your right breast looks normal.  We have discussed numerous aspects of your care, with emphasis on surgical options. You may be a candidate for lumpectomy, although if  the cancer is more extensive then that may not be a good option for cosmetic reasons. You are also a candidate for mastectomy. YOu will need a lymph node biopsy called sentinel lymph node biopsy.  YOu will be referred for genetic counseling and genetic testing due to your family history  YOu will likely be offered chemotherapy by Dr. Jana Hakim.  Go ahead with the MRI guided biopsy of your left breast tomorrow.  Return to see Dr. Dalbert Batman next week. We will try to make final surgical decisions at that time. The next step in your treatment is the surgery. We will discuss the extent of surgery and Port-A-Cath insertion at that time.    Lumpectomy A lumpectomy is a form of "breast conserving" or "breast preservation" surgery. It may also be referred to as a partial mastectomy. During a lumpectomy, the portion of the breast that contains the cancerous tumor or breast mass (the lump) is removed. Some normal tissue around the lump may also be removed to make sure all of the tumor has been removed.  LET [facility] CARE PROVIDER KNOW ABOUT:  Any allergies you have.  All medicines you are taking, including vitamins, herbs, eye drops, creams, and over-the-counter medicines.  Previous problems you or members of your family have had with the use of anesthetics.  Any blood disorders you have.  Previous surgeries you have had.  Medical conditions you have. RISKS AND COMPLICATIONS Generally, this is a safe procedure. However, problems can occur and  include:  Bleeding.  Infection.  Pain.  Temporary swelling.  Change in the shape of the breast, particularly if a large portion is removed. BEFORE THE PROCEDURE  Ask your health care provider about changing or stopping your regular medicines. This is especially important if you are taking diabetes medicines or blood thinners.  Do not eat or drink anything after midnight on the night before the procedure or as directed by your health care provider. Ask your health care provider if you can take a sip of water with any approved medicines.  On the day of surgery, your health care provider will use a mammogram or ultrasound to locate and mark the tumor in your breast. These markings on your breast will show where the cut (incision) will be made. PROCEDURE   An IV tube will be put into one of your veins.  You may be given medicine to help you relax before the surgery (sedative). You will be given one of the following:  A medicine that numbs the area (local anesthetic).  A medicine that makes you fall asleep (general anesthetic).  Your health care provider will use a kind of electric scalpel that uses heat to minimize bleeding (electrocautery knife).  A curved incision (like a smile or frown) that follows the natural curve of your breast is made, to allow for minimal scarring and better healing.  The tumor will be removed with some of the surrounding tissue. This will be sent to the lab for analysis. Your health care provider may also remove your lymph nodes at this time if needed.  Sometimes,  but not always, a rubber tube called a drain will be surgically inserted into your breast area or armpit to collect excess fluid that may accumulate in the space where the tumor was. This drain is connected to a plastic bulb on the outside of your body. This drain creates suction to help remove the fluid.  The incisions will be closed with stitches (sutures).  A bandage may be placed over the  incisions. AFTER THE PROCEDURE  You will be taken to the recovery area.  You will be given medicine for pain.  A small rubber drain may be placed in the breast for 2-3 days to prevent a collection of blood (hematoma) from developing in the breast. You will be given instructions on caring for the drain before you go home.  A pressure bandage (dressing) will be applied for 1-2 days to prevent bleeding. Ask your health care provider how to care for your bandage at home. Document Released: 08/17/2006 Document Revised: 11/20/2013 Document Reviewed: 12/09/2012 [facility] Patient Information 2015 [place], [place]. This information is not intended to replace advice given to you by your health care provider. Make sure you discuss any questions you have with your health care provider.      Mastectomy, With or Without Reconstruction Mastectomy (removal of the breast) is a procedure most commonly used to treat cancer (tumor) of the breast. Different procedures are available for treatment. This depends on the stage of the tumor (abnormal growths). Discuss this with your caregiver, surgeon (a specialist for performing operations such as this), or oncologist (someone specialized in the treatment of cancer). With proper information, you can decide which treatment is best for you. Although the sound of the word cancer is frightening to all of [place], the new treatments and medications can be a source of reassurance and comfort. If there are things you are worried about, discuss them with your caregiver. He or she can help comfort you and your family. Some of the different procedures for treating breast cancer are:  Radical (extensive) mastectomy. This is an operation used to remove the entire breast, the muscles under the breast, and all of the glands (lymph nodes) under the arm. With all of the new treatments available for cancer of the breast, this procedure has become less common.  Modified radical mastectomy. This  is a similar operation to the radical mastectomy described above. In the modified radical mastectomy, the muscles of the chest wall are not removed unless one of the lessor muscles is removed. One of the lessor muscles may be removed to allow better removal of the lymph nodes. The axillary lymph nodes are also removed. Rarely, during an axillary node dissection nerves to this area are damaged. Radiation therapy is then often used to the area following this surgery.  A total mastectomy also known as a complete or simple mastectomy. It involves removal of only the breast. The lymph nodes and the muscles are left in place.  In a lumpectomy, the lump is removed from the breast. This is the simplest form of surgical treatment. A sentinel lymph node biopsy may also be done. Additional treatment may be required. RISKS AND COMPLICATIONS The main problems that follow removal of the breast include:  Infection (germs start growing in the wound). This can usually be treated with antibiotics (medications that kill germs).  Lymphedema. This means the arm on the side of the breast that was operated on swells because the lymph (tissue fluid) cannot follow the main channels back into the  body. This only occurs when the lymph nodes have had to be removed under the arm.  There may be some areas of numbness to the upper arm and around the incision (cut by the surgeon) in the breast. This happens because of the cutting of or damage to some of the nerves in the area. This is most often unavoidable.  There may be difficulty moving the arm in a full range of motion (moving in all directions) following surgery. This usually improves with time following use and exercise.  Recurrence of breast cancer may happen with the very best of surgery and follow up treatment. Sometimes small cancer cells that cannot be seen with the naked eye have already spread at the time of surgery. When this happens other treatment is available. This  treatment may be radiation, medications or a combination of both. RECONSTRUCTION Reconstruction of the breast may be done immediately if there is not going to be post-operative radiation. This surgery is done for cosmetic (improve appearance) purposes to improve the physical appearance after the operation. This may be done in two ways:  It can be done using a saline filled prosthetic (an artificial breast which is filled with salt water). Silicone breast implants are now re-approved by the FDA and are being commonly used.  Reconstruction can be done using the body's own muscle/fat/skin. Your caregiver will discuss your options with you. Depending upon your needs or choice, together you will be able to determine which procedure is best for you. Document Released: 03/31/2001 Document Revised: 03/30/2012 Document Reviewed: 11/22/2007 [facility] Patient Information 2015 [place], [place]. This information is not intended to replace advice given to you by your health care provider. Make sure you discuss any questions you have with your health care provider.

## 2014-03-07 NOTE — Progress Notes (Signed)
Radiation Oncology         [phone]) [phone] ________________________________  Initial outpatient Consultation - Date: 03/07/2014   Name: Stacy Fernandez MRN: [id number]   DOB: 05-23-1961  REFERRING PHYSICIAN: Adin Hector, MD  DIAGNOSIS: T1cN0 Invasive Ductal Carcinoma (ER-PR-HEr2+)  HISTORY OF PRESENT ILLNESS::Stacy Fernandez is a 53 y.o. female  Who presented with left breast pain radiating to the left axilla and skin retraction. A mass was noted in the upper outer quadrant. This measured 1.1 cm on ultrasound. MRI showed a 1.8cm x 1.3 cm x 1.0 cm mass. Enhancement was noted extending anteriorly 4.5 cm. A biopsy of this was recommended and scheduled for 8/20. Biopsy showed a Grade 3 IDC with ER-PR-HER2+ Ki67 was 83%. She is GXP4 with her first live birth at 58. Menses began at 15 and she is post menopausal.  She used HRT for 2 years but is not using it now She does continue to smoke. She is accompanied by her husband and daughter.   PREVIOUS RADIATION THERAPY: No  PAST MEDICAL HISTORY:  has a past medical history of Hypothyroidism; Shortness of breath; COPD (chronic obstructive pulmonary disease); Emphysema; Migraine; and Hematuria.    PAST SURGICAL HISTORY: Past Surgical History  Procedure Laterality Date  . Cholecystectomy    . Abdominal hysterectomy    . Appendectomy    . Endo wrap    . Video bronchoscopy Bilateral 01/16/2013    Procedure: VIDEO BRONCHOSCOPY WITHOUT FLUORO;  Surgeon: [name], MD;  Location: [facility];  Service: Cardiopulmonary;  Laterality: Bilateral;    FAMILY HISTORY:  Family History  Problem Relation Age of Onset  . Heart failure Mother   . Colon cancer Father   . Throat cancer Brother   . Heart attack Maternal Grandmother   . Lung cancer Maternal Grandfather   . Colon cancer Paternal Grandmother   . Cancer Paternal Grandfather     kidney and bladder  . Lung cancer Brother   . Throat cancer Brother   . Lung cancer Brother     SOCIAL  HISTORY:  History  Substance Use Topics  . Smoking status: Current Every Day Smoker -- 0.50 packs/day for 37 years    Types: Cigarettes  . Smokeless tobacco: Current User     Comment: uses e-cig 01/03/13, starting using Chantix 11/13/13  . Alcohol Use: No    ALLERGIES: Contrast media  MEDICATIONS:  Current Outpatient Prescriptions  Medication Sig Dispense Refill  . atorvastatin (LIPITOR) 10 MG tablet Take 10 mg by mouth daily.      . budesonide-formoterol (SYMBICORT) 160-4.5 MCG/ACT inhaler Inhale 2 puffs into the lungs 2 (two) times daily.      Marland Kitchen ketorolac (TORADOL) 10 MG tablet TAKE 1 TABLET BY MOUTH 3 TIMES A DAY AS NEEDED  30 tablet  2  . levothyroxine (SYNTHROID, LEVOTHROID) 75 MCG tablet Take 75 mcg by mouth daily before breakfast.      . montelukast (SINGULAIR) 10 MG tablet       . nitroGLYCERIN (NITROSTAT) 0.4 MG SL tablet Place 0.4 mg under the tongue every 5 (five) minutes as needed for chest pain.      Marland Kitchen PROAIR HFA 108 (90 BASE) MCG/ACT inhaler Inhale 2 puffs into the lungs every 4 (four) hours as needed.      . rizatriptan (MAXALT-MLT) 5 MG disintegrating tablet Take 1 tablet (5 mg total) by mouth as needed for migraine. May repeat in 2 hours if needed  15 tablet  12  .  SUMAtriptan (IMITREX) 6 MG/0.5ML SOSY injection Inject 0.5 mLs (6 mg total) into the skin every 2 (two) hours as needed for migraine or headache. F  15 Syringe  12  . tiZANidine (ZANAFLEX) 2 MG tablet TAKE 1 TABLET (2 MG TOTAL) BY MOUTH 2 (TWO) TIMES DAILY.  60 tablet  2  . venlafaxine XR (EFFEXOR-XR) 150 MG 24 hr capsule Take 1 capsule (150 mg total) by mouth daily.  90 capsule  1  . verapamil (CALAN-SR) 120 MG CR tablet TAKE 1 TABLET BY MOUTH AT BEDTIME  90 tablet  3   Current Facility-Administered Medications  Medication Dose Route Frequency Provider Last Rate Last Dose  . botulinum toxin Type A (BOTOX) injection 150 Units  150 Units Intramuscular Once Marcial Pacas, MD        REVIEW OF SYSTEMS:  A 15 point  review of systems is documented in the electronic medical record. This was obtained by the nursing staff. However, I reviewed this with the patient to discuss relevant findings and make appropriate changes.  Pertinent items are noted in HPI.  PHYSICAL EXAM: There were no vitals filed for this visit.. . Thin female. Tan skin. No palpable adenopathy in the cervical, supraclavicular or axillary regions. No palpable right breast masses. Her left breast is tender with minimal biopsy change palapable over the upper outer quadrant. She is alert and oriented x 3.   LABORATORY DATA:  Lab Results  Component Value Date   WBC 5.8 03/07/2014   HGB 12.5 03/07/2014   HCT 36.1 03/07/2014   MCV 89.4 03/07/2014   PLT 233 03/07/2014   Lab Results  Component Value Date   NA 141 03/07/2014   K 3.9 03/07/2014   CL 104 01/05/2013   CO2 22 03/07/2014   Lab Results  Component Value Date   ALT 13 03/07/2014   AST 15 03/07/2014   ALKPHOS 108 03/07/2014   BILITOT 0.24 03/07/2014     RADIOGRAPHY: Mr Breast Bilateral W Wo Contrast  02/28/2014   CLINICAL DATA:  53 year old female with biopsy proven invasive ductal carcinoma in the 3 o'clock region of the left breast.  LABS:  None obtained at the time of MRI.  EXAM: BILATERAL BREAST MRI WITH AND WITHOUT CONTRAST  TECHNIQUE: Multiplanar, multisequence MR images of both breasts were obtained prior to and following the intravenous administration of 59m of MultiHance.  THREE-DIMENSIONAL MR IMAGE RENDERING ON INDEPENDENT WORKSTATION:  Three-dimensional MR images were rendered by post-processing of the original MR data on an independent workstation. The three-dimensional MR images were interpreted, and findings are reported in the following complete MRI report for this study. Three dimensional images were evaluated at the independent DynaCad workstation  COMPARISON:  Prior mammograms dated 02/21/2014 and 02/10/2012.  FINDINGS: Breast composition: b.  Scattered fibroglandular tissue.   Background parenchymal enhancement: Moderate  Right breast: In the posterior third of the 3 o'clock region of the left breast there is an irregular spiculated mass measuring 1.8 x 1.3 x 1.0 cm. Signal void artifact is seen in the mass from the biopsy clip. 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  Left breast: No mass or abnormal enhancement.  Lymph nodes: No abnormal appearing lymph nodes.  Ancillary findings:  None.  IMPRESSION: 1.8 cm mass in the 3 o'clock region of the left breast corresponding with the known invasive ductal carcinoma. There is 4.5 cm of linear an enhancement extending anteriorly from the mass concerning for ductal carcinoma  in situ.  RECOMMENDATION: MR guided core biopsy of the linear enhancement extending anteriorly from the left breast mass is recommended to prove the extent of disease.  BI-RADS CATEGORY  6: Known biopsy-proven malignancy.   Electronically Signed   By: Lillia Mountain M.D.   On: 02/28/2014 15:56     IMPRESSION: T1cN0 Invasive Ductal Carcinoma of the left breast  PLAN: I spoke to the patient today regarding her diagnosis and options for treatment. We discussed the equivalence in terms of survival and local failure between mastectomy and breast conservation. We discussed the role of radiation in decreasing local failures in patients who undergo mastectomy and have positive lymph nodes or tumors greater than 5 cm. We discussed the role of radiation and decreasing local failures in patients who undergo lumpectomy.We discussed the possible side effects including but not limited to asymptomatic rib, heart and lung damage, heart disease, skin redness, fatigue, permanent skin darkening, and chest wall swelling. We discussed increased complications that can occur with reconstruction after radiation. We discussed the process of simulation and the placement tattoos. We discussed the low likelihood of secondary malignancies.      We will know more in terms of her surgery after her biopsy. She will require chemotherapy due to her HER2 positive state. I will see her back if needed after surgery and chemotherapy.   I spent 60 minutes  face to face with the patient and more than 50% of that time was spent in counseling and/or coordination of care.   ------------------------------------------------  [name], MD

## 2014-03-08 ENCOUNTER — Ambulatory Visit
Admission: RE | Admit: 2014-03-08 | Discharge: 2014-03-08 | Disposition: A | Discharge status: Home / Self Care | Payer: BC Managed Care – PPO | Source: Ambulatory Visit | Attending: Radiology | Admitting: Radiology

## 2014-03-08 DIAGNOSIS — R928 Other abnormal and inconclusive findings on diagnostic imaging of breast: Secondary | ICD-10-CM

## 2014-03-08 MED ORDER — GADOBENATE DIMEGLUMINE 529 MG/ML IV SOLN
13.0000 mL | Freq: Once | INTRAVENOUS | Status: AC | PRN
  Administered 2014-03-08: 13 mL via INTRAVENOUS

## 2014-03-12 ENCOUNTER — Ambulatory Visit (HOSPITAL_COMMUNITY)
Admission: RE | Admit: 2014-03-12 | Discharge: 2014-03-12 | Disposition: A | Discharge status: Home / Self Care | Payer: BC Managed Care – PPO | Source: Ambulatory Visit | Attending: Oncology | Admitting: Oncology

## 2014-03-12 ENCOUNTER — Encounter (HOSPITAL_COMMUNITY): Payer: Self-pay

## 2014-03-12 DIAGNOSIS — I2510 Atherosclerotic heart disease of native coronary artery without angina pectoris: Secondary | ICD-10-CM | POA: Diagnosis not present

## 2014-03-12 DIAGNOSIS — I700 Atherosclerosis of aorta: Secondary | ICD-10-CM | POA: Diagnosis not present

## 2014-03-12 DIAGNOSIS — J438 Other emphysema: Secondary | ICD-10-CM | POA: Insufficient documentation

## 2014-03-12 DIAGNOSIS — R928 Other abnormal and inconclusive findings on diagnostic imaging of breast: Secondary | ICD-10-CM | POA: Diagnosis not present

## 2014-03-12 DIAGNOSIS — C50419 Malignant neoplasm of upper-outer quadrant of unspecified female breast: Secondary | ICD-10-CM | POA: Diagnosis not present

## 2014-03-12 DIAGNOSIS — Z9889 Other specified postprocedural states: Secondary | ICD-10-CM | POA: Diagnosis not present

## 2014-03-12 DIAGNOSIS — C50412 Malignant neoplasm of upper-outer quadrant of left female breast: Secondary | ICD-10-CM

## 2014-03-12 LAB — GLUCOSE, CAPILLARY: Glucose-Capillary: 88 mg/dL (ref 70–99)

## 2014-03-12 MED ORDER — FLUDEOXYGLUCOSE F - 18 (FDG) INJECTION: 7.6800 | Freq: Once | INTRAVENOUS | Status: AC | PRN

## 2014-03-13 ENCOUNTER — Other Ambulatory Visit: Payer: Self-pay | Admitting: Neurology

## 2014-03-13 ENCOUNTER — Other Ambulatory Visit (INDEPENDENT_AMBULATORY_CARE_PROVIDER_SITE_OTHER): Payer: Self-pay

## 2014-03-13 ENCOUNTER — Ambulatory Visit (INDEPENDENT_AMBULATORY_CARE_PROVIDER_SITE_OTHER): Payer: BC Managed Care – PPO | Admitting: General Surgery

## 2014-03-13 ENCOUNTER — Encounter (INDEPENDENT_AMBULATORY_CARE_PROVIDER_SITE_OTHER): Payer: Self-pay | Admitting: General Surgery

## 2014-03-13 ENCOUNTER — Encounter: Payer: Self-pay | Admitting: *Deleted

## 2014-03-13 VITALS — BP 128/72 | HR 68 | Temp 98.0°F | Resp 18 | Ht 64.0 in | Wt 156.0 lb

## 2014-03-13 DIAGNOSIS — C50419 Malignant neoplasm of upper-outer quadrant of unspecified female breast: Secondary | ICD-10-CM

## 2014-03-13 DIAGNOSIS — D0592 Unspecified type of carcinoma in situ of left breast: Secondary | ICD-10-CM

## 2014-03-13 DIAGNOSIS — C50412 Malignant neoplasm of upper-outer quadrant of left female breast: Secondary | ICD-10-CM

## 2014-03-13 NOTE — Telephone Encounter (Signed)
Per phone note 04/13/2013

## 2014-03-13 NOTE — Progress Notes (Addendum)
Patient ID: Stacy Fernandez, female   DOB: Dec 29, 1960, 53 y.o.   MRN: [id number]  History: This patient returns with her husband for further discussion regarding her left breast cancer. Her initial presentation is summarized below: Stacy Fernandez is a 53 y.o. female. She is referred by Dr. Shelah Lewandowsky [facility] for evaluation and management of an invasive cancer of the left breast laterally at the 9:00 position with possible additional DCIS anterior to that. Dr. Charleston Poot is her PCP. Dr. Elsworth Soho is her pulmonologist. Dr. Floyde Parkins is her neurologist.  She is being evaluated in the [facility] today by Dr. Pablo Ledger, Dr. Jana Hakim, and me  The patient's last mammogram was 2 years ago. She's had some pain and dimpling in the lateral aspect of the left breast for a little while. Recent mammograms and ultrasound show a 1.1 cm mass in the left breast laterally some skin dimpling was noted. Image guided biopsy showed grade 3 invasive ductal carcinoma, HER-2 positive, receptor negative. Subsequent MRI showed a 1.8 cm mass in the left breast at 3:00 position and a 4.5 cm area of additional linear enhancement without mass extending anteriorly from the primary cancer, suspicious for DCIS. Lymph nodes are negative.  She was interested in considering breast conservation and so she will went back and underwent MRI guided biopsy of the anterior linear enhancement. An area of linear enhancement 4.5 cm anterior to the primary cancer revealed ductal carcinoma in situ.  Family history reveals ovarian cancer in a sister and a niece. Stomach cancer in her father. THe sister had colon cancer. Has head and neck cancer in the family. Nobody has had breast cancer Genetic testing was obtained but the results are pending.  Comorbidities include aneurysm in the brain followed by Dr. Jannifer Franklin. Migraine headaches necessitating Botox injections from time to time. Coronary artery Disease. COPD. Hypertension. Benign hematuria  followed by Dr. Diona Fanti.  Social history reveals that she is married and here with her husband. She smokes cigarettes but denies alcohol.   Past history, family history, social history, and review of systems are documented on the chart, unchanged, and noncontributory except as described above.  Current Outpatient Prescriptions    atorvastatin (LIPITOR) 10 MG tablet  Take 10 mg by mouth daily.   budesonide-formoterol (SYMBICORT) 160-4.5 MCG/ACT inhaler  Inhale 2 puffs into the lungs 2 (two) times daily.   ketorolac (TORADOL) 10 MG tablet  TAKE 1 TABLET BY MOUTH 3 TIMES A DAY AS NEEDED   levothyroxine (SYNTHROID, LEVOTHROID) 75 MCG tablet  Take 75 mcg by mouth daily before breakfast.   montelukast (SINGULAIR) 10 MG tablet    nitroGLYCERIN (NITROSTAT) 0.4 MG SL tablet  Place 0.4 mg under the tongue every 5 (five) minutes as needed for chest pain.   PROAIR HFA 108 (90 BASE) MCG/ACT inhaler  Inhale 2 puffs into the lungs every 4 (four) hours as needed.   rizatriptan (MAXALT-MLT) 5 MG disintegrating tablet  Take 1 tablet (5 mg total) by mouth as needed for migraine. May repeat in 2 hours if needed  SUMAtriptan (IMITREX) 6 MG/0.5ML SOSY injection  Inject 0.5 mLs (6 mg total) into the skin every 2 (two) hours as needed for migraine or headache. F   tiZANidine (ZANAFLEX) 2 MG tablet  TAKE 1 TABLET (2 MG TOTAL) BY MOUTH 2 (TWO) TIMES DAILY.  venlafaxine XR (EFFEXOR-XR) 150 MG 24 hr capsule  Take 1 capsule (150 mg total) by mouth daily.  verapamil (CALAN-SR) 120 MG CR tablet  TAKE 1 TABLET BY MOUTH AT BEDTIME   Physical Exam  Constitutional: She is oriented to person, place, and time. She appears well-developed and well-nourished. No distress.  HENT:  Head: Normocephalic and atraumatic.  Nose: Nose normal.  Mouth/Throat: No oropharyngeal exudate.  Eyes: Conjunctivae and EOM are normal. Pupils are equal, round, and reactive to light. Left eye exhibits no discharge. No scleral  icterus.  Neck: Neck supple. No JVD present. No tracheal deviation present. No thyromegaly present.  Cardiovascular: Normal rate, regular rhythm, normal heart sounds and intact distal pulses.  No murmur heard.  Pulmonary/Chest: Effort normal and breath sounds normal. No respiratory distress. She has no wheezes. She has no rales. She exhibits no tenderness.  Bra size 38C. Left breast reveals a little bit of skin dimpling at the 3:30 position but the skin otherwise looks healthy. There is a 1.5 cm tender mass at the 3:00 position of the left breast. I do not see any other skin changes feel any other masses on either side. There is no axillary adenopathy.  Abdominal: Soft. Bowel sounds are normal. She exhibits no distension and no mass. There is no tenderness. There is no rebound and no guarding.  Multiple laparoscopy scars. Pfannenstiel incision. No mass.  Musculoskeletal: She exhibits no edema and no tenderness.  Lymphadenopathy:  She has no cervical adenopathy.  Neurological: She is alert and oriented to person, place, and time. She exhibits normal muscle tone. Coordination normal.  Skin: Skin is warm. No rash noted. She is not diaphoretic. No erythema. No pallor.  Psychiatric: She has a normal mood and affect. Her behavior is normal. Judgment and thought content normal.   Assessment  Invasive ductal carcinoma left breast, 3:00 position, grade 3, HER-2 positive, receptor negative. Clinical stage TI C., NX  Suspicious non-mass enhancing extending 4.5 anteriorly from the primary tumor, . 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 Migraine headaches  Coronary artery disease  COPD  Ongoing tobacco use  Hypertension  Cerebral aneurysm  Benign hematuria Strong family history for multiple cancers    Plan  We spent the better part of an hour discussing her options. I told her that her options are mastectomy, partial mastectomy with double wire localization, sentinel node  biopsy, and Port-A-Cath insertion. I told her that if she chooses mastectomy then immediate  or delayed reconstruction should be considered.   We talked about the extent of breast removal with lumpectomy withthe extent of tumor being at least  6-1/2 cm from anterior to posterior requiring  a sizable lumpectomy. I told her we could probably get this done with 2 wires and plastic surgical revision and symmetry procedures. I told her that if she had positive margins she might have to have a reexcision of margins. At the end of discussion she wanted to proceed with left total mastectomy, axillary sentinel lymph biopsy for Port-A-Cath insertion. She does want to consider reconstruction.  She will be referred for plastic surgical consultation preop  Genetic testing is pending. She states that she might want bilateral mastectomies if she is genetic for positive. I told her that was reasonable but that was her choice and is not mandatory ADDENDUM (03/22/2014):  BRCA1/BRCA2 genetic testing NEGATIVE.  She will tentatively be scheduled for Port-A-Cath insertion, left total mastectomy and left axilla sentinel node biopsy, with or without immediate reconstruction at the hospital the near future. We will wait for plastic surgical opinion And genetic testing.we will schedule as soon as possible thereafter.  ADDENDUM (  03/27/2014):  The patient was seen by Dr. Iran Planas on 03/22/2014. She was concerned about recent tobacco use. Patient desires autologous reconstruction. For this reason she recommends that we proceed with mastectomy alone and this time and that the patient have delayed reconstruction. I have instructed my office to proceed with scheduling of her breast surgery as outlined.  I discussed the indications, details, techniques, and numerous risks of all the surgery. She was strongly urge to stop smoking immediately because of possible reconstruction. She's aware of the risk of bleeding, infection, arm swelling,  malfunction of the Port-A-Cath, pneumothorax, skin necrosis, shoulder disability, delayed chemotherapy if there are wound healing problems, and other unforeseen problems. She understands all these issues well. At this time all of her questions are . She agrees with this plan.   Edsel Petrin. Dalbert Batman, M.D., [facility] General and Minimally invasive Surgery Breast and Colorectal Surgery Office:   [phone] Pager:   [phone]

## 2014-03-13 NOTE — Patient Instructions (Signed)
Your second left breast biopsy shows ductal carcinoma in situ, 4.5 cm anterior to the original invasive cancer. Your PET scan shows no evidence of cancer outside of the breast, which is good news.  We have talked about all the different options including mastectomy, lumpectomy with 2 wire placement, plastic surgical involvement with symmetry procedures, mastectomy with immediate or delayed reconstruction. At the end of a long discussion you have decided he would like to go ahead with left total mastectomy, axillary sentinel node biopsy and Port-A-Cath insertion and possible immediate reconstruction.  You'll be referred to a plastic surgeon immediately  It is important to stop smoking immediately  We will await your genetic testing to see if that makes a difference to you       Mastectomy, With or Without Reconstruction Mastectomy (removal of the breast) is a procedure most commonly used to treat cancer (tumor) of the breast. Different procedures are available for treatment. This depends on the stage of the tumor (abnormal growths). Discuss this with your caregiver, surgeon (a specialist for performing operations such as this), or oncologist (someone specialized in the treatment of cancer). With proper information, you can decide which treatment is best for you. Although the sound of the word cancer is frightening to all of [place], the new treatments and medications can be a source of reassurance and comfort. If there are things you are worried about, discuss them with your caregiver. He or she can help comfort you and your family. Some of the different procedures for treating breast cancer are:  Radical (extensive) mastectomy. This is an operation used to remove the entire breast, the muscles under the breast, and all of the glands (lymph nodes) under the arm. With all of the new treatments available for cancer of the breast, this procedure has become less common.  Modified radical mastectomy. This  is a similar operation to the radical mastectomy described above. In the modified radical mastectomy, the muscles of the chest wall are not removed unless one of the lessor muscles is removed. One of the lessor muscles may be removed to allow better removal of the lymph nodes. The axillary lymph nodes are also removed. Rarely, during an axillary node dissection nerves to this area are damaged. Radiation therapy is then often used to the area following this surgery.  A total mastectomy also known as a complete or simple mastectomy. It involves removal of only the breast. The lymph nodes and the muscles are left in place.  In a lumpectomy, the lump is removed from the breast. This is the simplest form of surgical treatment. A sentinel lymph node biopsy may also be done. Additional treatment may be required. RISKS AND COMPLICATIONS The main problems that follow removal of the breast include:  Infection (germs start growing in the wound). This can usually be treated with antibiotics (medications that kill germs).  Lymphedema. This means the arm on the side of the breast that was operated on swells because the lymph (tissue fluid) cannot follow the main channels back into the body. This only occurs when the lymph nodes have had to be removed under the arm.  There may be some areas of numbness to the upper arm and around the incision (cut by the surgeon) in the breast. This happens because of the cutting of or damage to some of the nerves in the area. This is most often unavoidable.  There may be difficulty moving the arm in a full range of motion (moving in all directions)  following surgery. This usually improves with time following use and exercise.  Recurrence of breast cancer may happen with the very best of surgery and follow up treatment. Sometimes small cancer cells that cannot be seen with the naked eye have already spread at the time of surgery. When this happens other treatment is available. This  treatment may be radiation, medications or a combination of both. RECONSTRUCTION Reconstruction of the breast may be done immediately if there is not going to be post-operative radiation. This surgery is done for cosmetic (improve appearance) purposes to improve the physical appearance after the operation. This may be done in two ways:  It can be done using a saline filled prosthetic (an artificial breast which is filled with salt water). Silicone breast implants are now re-approved by the FDA and are being commonly used.  Reconstruction can be done using the body's own muscle/fat/skin. Your caregiver will discuss your options with you. Depending upon your needs or choice, together you will be able to determine which procedure is best for you. Document Released: 03/31/2001 Document Revised: 03/30/2012 Document Reviewed: 11/22/2007 [facility] Patient Information 2015 [place], [place]. This information is not intended to replace advice given to you by your health care provider. Make sure you discuss any questions you have with your health care provider.

## 2014-03-14 ENCOUNTER — Telehealth: Payer: Self-pay | Admitting: *Deleted

## 2014-03-14 ENCOUNTER — Ambulatory Visit (INDEPENDENT_AMBULATORY_CARE_PROVIDER_SITE_OTHER): Payer: BC Managed Care – PPO | Admitting: Neurology

## 2014-03-14 ENCOUNTER — Encounter: Payer: Self-pay | Admitting: Neurology

## 2014-03-14 ENCOUNTER — Ambulatory Visit: Payer: BC Managed Care – PPO | Admitting: Neurology

## 2014-03-14 VITALS — BP 120/76 | HR 95 | Ht 66.5 in | Wt 155.0 lb

## 2014-03-14 DIAGNOSIS — G43719 Chronic migraine without aura, intractable, without status migrainosus: Secondary | ICD-10-CM

## 2014-03-14 DIAGNOSIS — G43909 Migraine, unspecified, not intractable, without status migrainosus: Secondary | ICD-10-CM

## 2014-03-14 MED ORDER — SUMATRIPTAN SUCCINATE 6 MG/0.5ML ~~LOC~~ SOSY: PREFILLED_SYRINGE | SUBCUTANEOUS | Status: DC

## 2014-03-14 MED ORDER — RIZATRIPTAN BENZOATE 10 MG PO TBDP: 10.0000 mg | ORAL_TABLET | ORAL | Status: DC | PRN

## 2014-03-14 MED ORDER — TIZANIDINE HCL 2 MG PO TABS: 2.0000 mg | ORAL_TABLET | Freq: Two times a day (BID) | ORAL | Status: DC | PRN

## 2014-03-14 MED ORDER — VERAPAMIL HCL ER 120 MG PO TBCR: 120.0000 mg | EXTENDED_RELEASE_TABLET | Freq: Every day | ORAL | Status: DC

## 2014-03-14 NOTE — Telephone Encounter (Signed)
Left vm for pt to call regarding [place] from 03/07/14. Contact information given.

## 2014-03-15 ENCOUNTER — Telehealth: Payer: Self-pay | Admitting: *Deleted

## 2014-03-15 ENCOUNTER — Telehealth: Payer: Self-pay | Admitting: Oncology

## 2014-03-15 NOTE — Telephone Encounter (Signed)
Spoke to pt concerning Stacy Fernandez from 03/07/14. Pt denies questions or concerns regarding her dx or treatment care plan. Pt completed genetic testing and is awaiting on results. Met with Dr. Dalbert Batman with the plan of Left Mastectomy with immediate reconstruction. Pt relayed if she does have positive genetic results she will choose bilateral mastectomies with reconstruction. Pt waiting for appt with plastics. Encourage pt to call with further needs. Received verbal understanding. Contact information given.

## 2014-03-15 NOTE — Telephone Encounter (Signed)
, °

## 2014-03-16 ENCOUNTER — Encounter (INDEPENDENT_AMBULATORY_CARE_PROVIDER_SITE_OTHER): Payer: Self-pay | Admitting: *Deleted

## 2014-03-16 ENCOUNTER — Telehealth (INDEPENDENT_AMBULATORY_CARE_PROVIDER_SITE_OTHER): Payer: Self-pay | Admitting: *Deleted

## 2014-03-16 MED ORDER — ONABOTULINUMTOXINA 100 UNITS IJ SOLR
150.0000 [IU] | Freq: Once | INTRAMUSCULAR | Status: AC
  Administered 2014-03-16: 150 [IU] via INTRAMUSCULAR

## 2014-03-16 NOTE — Progress Notes (Signed)
History of present illness:  Ms. Gilden is a 53 year old right-handed white female referred by Dr. Jannifer Franklin for Botox injection as migraine prevention  She had frequent headaches since May 2013, she woke up in May, notice left-sided headache, across her left forehead, feel likes that her left eye is going to pop out, she has no visual loss, first severe headache lasts about 2 days, tried numerous over-the-counter medications, including Excedrin Migraine, Tylenol, Aleve without help,  Ever since the initial episode, she has frequent headaches, most on the left side, occasionally on the right side, excruciating pounding headache, with associated light noise sensitivity, nauseous, she often woke up from sleep with headaches  CT head without contrast was normal, MRI cervical showed mild spondylitic disease, but no acute lesions MRI of the brain done previously was normal. 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    Over the past 2 years, she has tried different preventative medications this including propranolol, Topamax, Effexor, without helping,  She still has 6 headaches in a week , moderately severe, lasting for few days  UPDATE August 2015:  She received last Botox injection as migraine prevention since Jan 2015, responded very well,  last injection was in April 2015    Past Medical History  Diagnosis Date  . Hypothyroidism   . Shortness of breath     2D ECHO, 04/20/2012 - EF >55%, normal; EXERCISE STRESS TEST, 04/20/2012 - normal, EKG negative for ischemia, no ECG changes  . COPD (chronic obstructive pulmonary disease)   . Emphysema   . Migraine   . Hematuria     Past Surgical History  Procedure Laterality Date  . Cholecystectomy    . Abdominal hysterectomy    . Appendectomy    . Endo  wrap    . Video bronchoscopy Bilateral 01/16/2013    Procedure: VIDEO BRONCHOSCOPY WITHOUT FLUORO;  Surgeon: [name], MD;  Location: [facility];  Service: Cardiopulmonary;  Laterality: Bilateral;    Family History  Problem Relation Age of Onset  . Heart failure Mother   . Colon cancer Father   . Throat cancer Brother   . Heart attack Maternal Grandmother   . Lung cancer Maternal Grandfather   . Colon cancer Paternal Grandmother   . Cancer Paternal Grandfather     kidney and bladder  . Lung cancer Brother   . Throat cancer Brother   . Lung cancer Brother     Social history:  reports that she has been smoking Cigarettes.  She has a 37 pack-year smoking history. She uses smokeless tobacco. She reports that she does not drink alcohol or use illicit drugs.  Medications:  Current Outpatient Prescriptions on File Prior to Visit  Medication Sig Dispense Refill  . aspirin 81 MG tablet Take 1 tablet (81 mg total) by mouth daily.  30 tablet  0  . gabapentin (NEURONTIN) 100 MG capsule Take 200 mg by mouth at bedtime.       Marland Kitchen ketorolac (TORADOL) 10 MG tablet Take 1 tablet (10 mg total) by mouth every 6 (six) hours as needed (headache).  30 tablet  0  . levothyroxine (SYNTHROID, LEVOTHROID) 75 MCG tablet Take 75 mcg by mouth daily before breakfast.      . mometasone-formoterol (DULERA) 100-5 MCG/ACT AERO Inhale 2 puffs into the lungs 2 (two) times daily.      Marland Kitchen  nitroGLYCERIN (NITROSTAT) 0.4 MG SL tablet Place 0.4 mg under the tongue every 5 (five) minutes as needed for chest pain.      Marland Kitchen PROAIR HFA 108 (90 BASE) MCG/ACT inhaler Inhale 2 puffs into the lungs every 4 (four) hours as needed.      Marland Kitchen tiZANidine (ZANAFLEX) 2 MG tablet Take 1 tablet (2 mg total) by mouth 2 (two) times daily.  60 tablet  2  . venlafaxine XR (EFFEXOR-XR) 150 MG 24 hr capsule Take 1 capsule (150 mg total) by mouth daily.  90 capsule  1  . verapamil (CALAN SR) 120 MG CR tablet Take 1 tablet (120 mg total) by mouth at  bedtime.  90 tablet  2   No current facility-administered medications on file prior to visit.    Allergies:  Allergies  Allergen Reactions  . Contrast Media [Iodinated Diagnostic Agents]     Heart stops and breathing stops    ROS:  Out of a complete 14 system review of symptoms, the patient complains only of the following symptoms, and all other reviewed systems are negative.  Headaches  Blood pressure 120/76, pulse 95, height 5' 6.5" (1.689 m), weight 155 lb (70.308 kg).  PHYSICAL EXAMINATOINS:  Generalized: In no acute distress  Neck: Supple, no carotid bruits   Cardiac: Regular rate rhythm  Pulmonary: Clear to auscultation bilaterally  Musculoskeletal: No deformity  Neurological examination  Mentation: Alert oriented to time, place, history taking, and causual conversation  Cranial nerve II-XII: Pupils were equal round reactive to light extraocular movements were full, visual field were full on confrontational test. facial sensation and strength were normal. hearing was intact to finger rubbing bilaterally. Uvula tongue midline.  head turning and shoulder shrug and were normal and symmetric.Tongue protrusion into cheek strength was normal.  Motor: normal tone, bulk and strength.  Sensory: Intact to fine touch, pinprick, preserved vibratory sensation, and proprioception at toes.  Coordination: Normal finger to nose, heel-to-shin bilaterally there was no truncal ataxia  Gait: Rising up from seated position without assistance, normal stance, without trunk ataxia, moderate stride, good arm swing, smooth turning, able to perform tiptoe, and heel walking without difficulty.   Romberg signs: Negative  Deep tendon reflexes: Brachioradialis 2/2, biceps 2/2, triceps 2/2, patellar 2/2, Achilles 2/2, plantar responses were flexor bilaterally.  Assessment/Plan:  53 years old Caucasian female, with frequent headaches, with migraine features, came in for Botox injection as  migraine prevention. BOTOX injection was performed according to protocol by [employer]. 100 units of BOTOX /2 cc NS.  Total of 150 units   Corrugator 2 sites, 5 units Temporalis 8 sites,  40 units  Occipitalis 6 sites, 30 units Cervical Paraspinal, 4 sites, 20 units Trapezius, 8 sites, 40 units  Extra cervical paraspinals 15 units  Patient tolerate the injection well. Will return for repeat injection in 3 months.

## 2014-03-16 NOTE — Telephone Encounter (Signed)
LMOM for pt to return my call regarding her FMLA paperwork.  Anderson Malta

## 2014-03-21 ENCOUNTER — Ambulatory Visit (HOSPITAL_COMMUNITY)
Admission: RE | Admit: 2014-03-21 | Discharge: 2014-03-21 | Disposition: A | Discharge status: Home / Self Care | Payer: BC Managed Care – PPO | Source: Ambulatory Visit | Attending: Oncology | Admitting: Oncology

## 2014-03-21 ENCOUNTER — Other Ambulatory Visit: Payer: BC Managed Care – PPO

## 2014-03-21 ENCOUNTER — Telehealth: Payer: Self-pay | Admitting: *Deleted

## 2014-03-21 ENCOUNTER — Encounter: Payer: Self-pay | Admitting: *Deleted

## 2014-03-21 ENCOUNTER — Encounter: Payer: Self-pay | Admitting: Oncology

## 2014-03-21 DIAGNOSIS — C50419 Malignant neoplasm of upper-outer quadrant of unspecified female breast: Secondary | ICD-10-CM | POA: Insufficient documentation

## 2014-03-21 DIAGNOSIS — Z01818 Encounter for other preprocedural examination: Secondary | ICD-10-CM | POA: Diagnosis present

## 2014-03-21 DIAGNOSIS — R079 Chest pain, unspecified: Secondary | ICD-10-CM | POA: Diagnosis not present

## 2014-03-21 DIAGNOSIS — F17200 Nicotine dependence, unspecified, uncomplicated: Secondary | ICD-10-CM | POA: Insufficient documentation

## 2014-03-21 DIAGNOSIS — I519 Heart disease, unspecified: Secondary | ICD-10-CM

## 2014-03-21 DIAGNOSIS — C50412 Malignant neoplasm of upper-outer quadrant of left female breast: Secondary | ICD-10-CM

## 2014-03-21 DIAGNOSIS — J449 Chronic obstructive pulmonary disease, unspecified: Secondary | ICD-10-CM | POA: Insufficient documentation

## 2014-03-21 DIAGNOSIS — I2510 Atherosclerotic heart disease of native coronary artery without angina pectoris: Secondary | ICD-10-CM | POA: Diagnosis not present

## 2014-03-21 DIAGNOSIS — J4489 Other specified chronic obstructive pulmonary disease: Secondary | ICD-10-CM | POA: Insufficient documentation

## 2014-03-21 NOTE — Progress Notes (Signed)
No date as of today for chemo.

## 2014-03-21 NOTE — Progress Notes (Signed)
  Echocardiogram 2D Echocardiogram has been performed.  Donata Clay 03/21/2014, 10:01 AM

## 2014-03-21 NOTE — Telephone Encounter (Signed)
Called spoke with pt. appt scheduled for 9/16 at 4:15. Nothing further needed

## 2014-03-21 NOTE — Telephone Encounter (Signed)
LMTCB x 1 

## 2014-03-21 NOTE — Telephone Encounter (Signed)
Message copied by Inge Rise on Wed Mar 21, 2014  8:40 AM ------      Message from: [name]      Created: Sun Mar 11, 2014  7:43 PM       Needs 1 y FU ------

## 2014-03-21 NOTE — Telephone Encounter (Signed)
Patient returning call.  [phone]

## 2014-03-22 ENCOUNTER — Encounter: Payer: Self-pay | Admitting: Genetic Counselor

## 2014-03-22 DIAGNOSIS — Z800 Family history of malignant neoplasm of digestive organs: Secondary | ICD-10-CM

## 2014-03-22 DIAGNOSIS — C50919 Malignant neoplasm of unspecified site of unspecified female breast: Secondary | ICD-10-CM

## 2014-03-22 DIAGNOSIS — Z803 Family history of malignant neoplasm of breast: Secondary | ICD-10-CM

## 2014-03-22 DIAGNOSIS — Z8041 Family history of malignant neoplasm of ovary: Secondary | ICD-10-CM

## 2014-03-22 NOTE — Progress Notes (Signed)
Stacy Fernandez recently had cancer genetic counseling at [facility] on 03/07/2014. At that time, it was recommended she pursue genetic testing. Her BRCA1 and BRCA2 gene test, which was performed at [facility], has returned and is negative for mutations. These results were disclosed to her today. Per her request, reflex testing for the OvaNext gene panel at [facility] was initiated. Results for the gene panel should be available in 2-3 more weeks and we will contact her to discuss these results and recommendations warranted by these results.

## 2014-03-23 ENCOUNTER — Telehealth: Payer: Self-pay | Admitting: *Deleted

## 2014-03-23 NOTE — Telephone Encounter (Signed)
Spoke with patient and she saw Dr. Iran Planas and they decided it was best to wait on getting reconstruction until patient is done with treatment.  She stated that she informed [facility] of her decision and they will contact her with an appointment.

## 2014-03-27 ENCOUNTER — Encounter: Payer: Self-pay | Admitting: Oncology

## 2014-03-30 ENCOUNTER — Encounter (HOSPITAL_COMMUNITY): Payer: Self-pay | Admitting: *Deleted

## 2014-04-01 MED ORDER — CHLORHEXIDINE GLUCONATE 4 % EX LIQD
1.0000 "application " | Freq: Once | CUTANEOUS | Status: DC
  Filled 2014-04-01: qty 15

## 2014-04-01 MED ORDER — CEFAZOLIN SODIUM-DEXTROSE 2-3 GM-% IV SOLR
2.0000 g | INTRAVENOUS | Status: AC
  Administered 2014-04-02 (×2): 2 g via INTRAVENOUS
  Filled 2014-04-01: qty 50

## 2014-04-01 NOTE — H&P (Signed)
Stacy Fernandez    MRN:  [id number]   Description: 53 year old female  Provider: [name], MD  Department: [facility]         Diagnoses      Breast cancer of upper-outer quadrant of left female breast    -  Primary      ICD-9-CM: 174.4 ICD-10-CM: C50.412                BP Pulse Temp(Src) Resp Ht Wt      128/72 68 98 F (36.7 C) 18 '5\' 4"'  (1.626 m) 156 lb (70.761 kg)      BMI - 26.76 kg/m2                  History and Physical     [name], MD    Status: Addendum            Patient ID: Stacy Fernandez, female   DOB: 02-24-1961, 53 y.o.   MRN: [id number]        History: This patient returns with her husband for further discussion regarding her left breast cancer. Her initial presentation is summarized below: Stacy Fernandez is a 53 y.o. female. She is referred by Dr. Shelah Lewandowsky [facility] for evaluation and management of an invasive cancer of the left breast laterally at the 9:00 position with possible additional DCIS anterior to that. Dr. Charleston Poot is her PCP. Dr. Elsworth Soho is her pulmonologist. Dr. Floyde Parkins is her neurologist.   She is being evaluated in the [facility] today by Dr. Pablo Ledger, Dr. Jana Hakim, and me   The patient's last mammogram was 2 years ago. She's had some pain and dimpling in the lateral aspect of the left breast for a little while. Recent mammograms and ultrasound show a 1.1 cm mass in the left breast laterally some skin dimpling was noted. Image guided biopsy showed grade 3 invasive ductal carcinoma, HER-2 positive, receptor negative. Subsequent MRI showed a 1.8 cm mass in the left breast at 3:00 position and a 4.5 cm area of additional linear enhancement without mass extending anteriorly from the primary cancer, suspicious for DCIS. Lymph nodes are negative.   She was interested in considering breast conservation and so she will went back and underwent MRI guided biopsy of the anterior linear enhancement. An area of  linear enhancement 4.5 cm anterior to the primary cancer revealed ductal carcinoma in situ.   Family history reveals ovarian cancer in a sister and a niece. Stomach cancer in her father. THe sister had colon cancer. Has head and neck cancer in the family. Nobody has had breast cancer Genetic testing was obtained but the results are pending.   Comorbidities include aneurysm in the brain followed by Dr. Jannifer Franklin. Migraine headaches necessitating Botox injections from time to time. Coronary artery Disease. COPD. Hypertension. Benign hematuria followed by Dr. Diona Fanti.   Social history reveals that she is married and here with her husband. She smokes cigarettes but denies alcohol.    Past history, family history, social history, and review of systems are documented on the chart, unchanged, and noncontributory except as described above.   Current Outpatient Prescriptions      atorvastatin (LIPITOR) 10 MG tablet   Take 10 mg by mouth daily.     budesonide-formoterol (SYMBICORT) 160-4.5 MCG/ACT inhaler   Inhale 2 puffs into the lungs 2 (two) times daily.     ketorolac (TORADOL) 10 MG tablet   TAKE 1 TABLET  BY MOUTH 3 TIMES A DAY AS NEEDED    levothyroxine (SYNTHROID, LEVOTHROID) 75 MCG tablet   Take 75 mcg by mouth daily before breakfast.     montelukast (SINGULAIR) 10 MG tablet      nitroGLYCERIN (NITROSTAT) 0.4 MG SL tablet   Place 0.4 mg under the tongue every 5 (five) minutes as needed for chest pain.     PROAIR HFA 108 (90 BASE) MCG/ACT inhaler   Inhale 2 puffs into the lungs every 4 (four) hours as needed.     rizatriptan (MAXALT-MLT) 5 MG disintegrating tablet   Take 1 tablet (5 mg total) by mouth as needed for migraine. May repeat in 2 hours if needed   SUMAtriptan (IMITREX) 6 MG/0.5ML SOSY injection   Inject 0.5 mLs (6 mg total) into the skin every 2 (two) hours as needed for migraine or headache. F     tiZANidine (ZANAFLEX) 2 MG tablet   TAKE 1 TABLET (2 MG TOTAL)  BY MOUTH 2 (TWO) TIMES DAILY.   venlafaxine XR (EFFEXOR-XR) 150 MG 24 hr capsule   Take 1 capsule (150 mg total) by mouth daily.   verapamil (CALAN-SR) 120 MG CR tablet   TAKE 1 TABLET BY MOUTH AT BEDTIME      Physical Exam  Constitutional: She is oriented to person, place, and time. She appears well-developed and well-nourished. No distress.   HENT:   Head: Normocephalic and atraumatic.   Nose: Nose normal.   Mouth/Throat: No oropharyngeal exudate.   Eyes: Conjunctivae and EOM are normal. Pupils are equal, round, and reactive to light. Left eye exhibits no discharge. No scleral icterus.   Neck: Neck supple. No JVD present. No tracheal deviation present. No thyromegaly present.   Cardiovascular: Normal rate, regular rhythm, normal heart sounds and intact distal pulses.   No murmur heard.   Pulmonary/Chest: Effort normal and breath sounds normal. No respiratory distress. She has no wheezes. She has no rales. She exhibits no tenderness.  Bra size 38C. Left breast reveals a little bit of skin dimpling at the 3:30 position but the skin otherwise looks healthy. There is a 1.5 cm tender mass at the 3:00 position of the left breast. I do not see any other skin changes feel any other masses on either side. There is no axillary adenopathy.   Abdominal: Soft. Bowel sounds are normal. She exhibits no distension and no mass. There is no tenderness. There is no rebound and no guarding.  Multiple laparoscopy scars. Pfannenstiel incision. No mass.  Musculoskeletal: She exhibits no edema and no tenderness.  Lymphadenopathy:  She has no cervical adenopathy.  Neurological: She is alert and oriented to person, place, and time. She exhibits normal muscle tone. Coordination normal.   Skin: Skin is warm. No rash noted. She is not diaphoretic. No erythema. No pallor.  Psychiatric: She has a normal mood and affect. Her behavior is normal. Judgment and thought content normal.    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 Clinical stage TI C., NX   Suspicious non-mass enhancing extending 4.5 anteriorly from the primary tumor, . 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 Migraine headaches   Coronary artery disease   COPD   Ongoing tobacco use   Hypertension   Cerebral aneurysm   Benign hematuria Strong family history for multiple cancers      Plan   We spent the better part of an hour discussing her options.  I told her that her options are mastectomy, partial mastectomy with double wire localization, sentinel node biopsy, and Port-A-Cath insertion. I told her that if she chooses mastectomy then immediate  or delayed reconstruction should be considered.   We talked about the extent of breast removal with lumpectomy withthe extent of tumor being at least  6-1/2 cm from anterior to posterior requiring  a sizable lumpectomy. I told her we could probably get this done with 2 wires and plastic surgical revision and symmetry procedures. I told her that if she had positive margins she might have to have a reexcision of margins. At the end of discussion she wanted to proceed with left total mastectomy, axillary sentinel lymph biopsy for Port-A-Cath insertion. She does want to consider reconstruction.   She will be referred for plastic surgical consultation preop   Genetic testing is pending. She states that she might want bilateral mastectomies if she is genetic for positive. I told her that was reasonable but that was her choice and is not mandatory ADDENDUM (03/22/2014):  BRCA1/BRCA2 genetic testing NEGATIVE.   She will tentatively be scheduled for Port-A-Cath insertion, left total mastectomy and left axilla sentinel node biopsy, with or without immediate reconstruction at the hospital the near future. We will wait for plastic surgical opinion And genetic testing.we will schedule as soon as possible  thereafter.   ADDENDUM (03/27/2014):  The patient was seen by Dr. Iran Planas on 03/22/2014. She was concerned about recent tobacco use. Patient desires autologous reconstruction. For this reason she recommends that we proceed with mastectomy alone and this time and that the patient have delayed reconstruction. I have instructed my office to proceed with scheduling of her breast surgery as outlined.   I discussed the indications, details, techniques, and numerous risks of all the surgery. She was strongly urge to stop smoking immediately because of possible reconstruction. She's aware of the risk of bleeding, infection, arm swelling, malfunction of the Port-A-Cath, pneumothorax, skin necrosis, shoulder disability, delayed chemotherapy if there are wound healing problems, and other unforeseen problems. She understands all these issues well. At this time all of her questions are . She agrees with this plan.     Edsel Petrin. Dalbert Batman, M.D., [facility] General and Minimally invasive Surgery Breast and Colorectal Surgery Office:   [phone] Pager:   [phone]

## 2014-04-02 ENCOUNTER — Ambulatory Visit (HOSPITAL_COMMUNITY): Payer: BC Managed Care – PPO

## 2014-04-02 ENCOUNTER — Encounter (HOSPITAL_COMMUNITY)
Admission: RE | Disposition: A | Discharge status: Home / Self Care | Payer: Self-pay | Source: Ambulatory Visit | Attending: General Surgery

## 2014-04-02 ENCOUNTER — Ambulatory Visit (HOSPITAL_COMMUNITY)
Admission: RE | Admit: 2014-04-02 | Discharge: 2014-04-03 | Disposition: A | Discharge status: Home / Self Care | Payer: BC Managed Care – PPO | Source: Ambulatory Visit | Attending: General Surgery | Admitting: General Surgery

## 2014-04-02 ENCOUNTER — Encounter (HOSPITAL_COMMUNITY): Payer: Self-pay | Admitting: *Deleted

## 2014-04-02 ENCOUNTER — Encounter (HOSPITAL_COMMUNITY): Payer: BC Managed Care – PPO | Admitting: Certified Registered"

## 2014-04-02 ENCOUNTER — Ambulatory Visit (HOSPITAL_COMMUNITY): Payer: BC Managed Care – PPO | Admitting: Certified Registered"

## 2014-04-02 ENCOUNTER — Ambulatory Visit (HOSPITAL_COMMUNITY)
Admission: RE | Admit: 2014-04-02 | Discharge: 2014-04-02 | Disposition: A | Discharge status: Home / Self Care | Payer: BC Managed Care – PPO | Source: Ambulatory Visit | Attending: General Surgery | Admitting: General Surgery

## 2014-04-02 DIAGNOSIS — Z800 Family history of malignant neoplasm of digestive organs: Secondary | ICD-10-CM | POA: Insufficient documentation

## 2014-04-02 DIAGNOSIS — I2510 Atherosclerotic heart disease of native coronary artery without angina pectoris: Secondary | ICD-10-CM | POA: Insufficient documentation

## 2014-04-02 DIAGNOSIS — Z4001 Encounter for prophylactic removal of breast: Secondary | ICD-10-CM | POA: Diagnosis not present

## 2014-04-02 DIAGNOSIS — J4489 Other specified chronic obstructive pulmonary disease: Secondary | ICD-10-CM | POA: Insufficient documentation

## 2014-04-02 DIAGNOSIS — C50419 Malignant neoplasm of upper-outer quadrant of unspecified female breast: Secondary | ICD-10-CM | POA: Diagnosis present

## 2014-04-02 DIAGNOSIS — Z8041 Family history of malignant neoplasm of ovary: Secondary | ICD-10-CM | POA: Insufficient documentation

## 2014-04-02 DIAGNOSIS — G43909 Migraine, unspecified, not intractable, without status migrainosus: Secondary | ICD-10-CM | POA: Diagnosis not present

## 2014-04-02 DIAGNOSIS — J449 Chronic obstructive pulmonary disease, unspecified: Secondary | ICD-10-CM | POA: Insufficient documentation

## 2014-04-02 DIAGNOSIS — F17200 Nicotine dependence, unspecified, uncomplicated: Secondary | ICD-10-CM | POA: Diagnosis not present

## 2014-04-02 DIAGNOSIS — I10 Essential (primary) hypertension: Secondary | ICD-10-CM | POA: Insufficient documentation

## 2014-04-02 DIAGNOSIS — Z171 Estrogen receptor negative status [ER-]: Secondary | ICD-10-CM | POA: Diagnosis present

## 2014-04-02 DIAGNOSIS — C50412 Malignant neoplasm of upper-outer quadrant of left female breast: Secondary | ICD-10-CM

## 2014-04-02 HISTORY — DX: Gastro-esophageal reflux disease without esophagitis: K21.9

## 2014-04-02 HISTORY — PX: SIMPLE MASTECTOMY WITH AXILLARY SENTINEL NODE BIOPSY: SHX6098

## 2014-04-02 HISTORY — DX: Anxiety disorder, unspecified: F41.9

## 2014-04-02 HISTORY — PX: PORTACATH PLACEMENT: SHX2246

## 2014-04-02 LAB — CBC WITH DIFFERENTIAL/PLATELET
BASOS ABS: 0 10*3/uL (ref 0.0–0.1)
BASOS PCT: 1 % (ref 0–1)
EOS PCT: 2 % (ref 0–5)
Eosinophils Absolute: 0.1 10*3/uL (ref 0.0–0.7)
HCT: 34 % — ABNORMAL LOW (ref 36.0–46.0)
Hemoglobin: 11.5 g/dL — ABNORMAL LOW (ref 12.0–15.0)
LYMPHS PCT: 25 % (ref 12–46)
Lymphs Abs: 1 10*3/uL (ref 0.7–4.0)
MCH: 30.7 pg (ref 26.0–34.0)
MCHC: 33.8 g/dL (ref 30.0–36.0)
MCV: 90.9 fL (ref 78.0–100.0)
Monocytes Absolute: 0.6 10*3/uL (ref 0.1–1.0)
Monocytes Relative: 14 % — ABNORMAL HIGH (ref 3–12)
Neutro Abs: 2.4 10*3/uL (ref 1.7–7.7)
Neutrophils Relative %: 58 % (ref 43–77)
PLATELETS: 203 10*3/uL (ref 150–400)
RBC: 3.74 MIL/uL — ABNORMAL LOW (ref 3.87–5.11)
RDW: 13.6 % (ref 11.5–15.5)
WBC: 4.1 10*3/uL (ref 4.0–10.5)

## 2014-04-02 LAB — COMPREHENSIVE METABOLIC PANEL
ALBUMIN: 3.4 g/dL — AB (ref 3.5–5.2)
ALT: 21 U/L (ref 0–35)
AST: 20 U/L (ref 0–37)
Alkaline Phosphatase: 112 U/L (ref 39–117)
Anion gap: 11 (ref 5–15)
BUN: 13 mg/dL (ref 6–23)
CALCIUM: 8.7 mg/dL (ref 8.4–10.5)
CO2: 23 meq/L (ref 19–32)
Chloride: 107 mEq/L (ref 96–112)
Creatinine, Ser: 0.9 mg/dL (ref 0.50–1.10)
GFR calc Af Amer: 84 mL/min — ABNORMAL LOW (ref >90)
GFR, EST NON AFRICAN AMERICAN: 72 mL/min — AB (ref >90)
Glucose, Bld: 93 mg/dL (ref 70–99)
Potassium: 4.1 mEq/L (ref 3.7–5.3)
Sodium: 141 mEq/L (ref 137–147)
Total Bilirubin: 0.3 mg/dL (ref 0.3–1.2)
Total Protein: 6.5 g/dL (ref 6.0–8.3)

## 2014-04-02 SURGERY — SIMPLE MASTECTOMY WITH AXILLARY SENTINEL NODE BIOPSY
Anesthesia: General | Site: Chest

## 2014-04-02 MED ORDER — MONTELUKAST SODIUM 10 MG PO TABS
10.0000 mg | ORAL_TABLET | Freq: Every day | ORAL | Status: DC
  Administered 2014-04-02: 10 mg via ORAL
  Filled 2014-04-02 (×2): qty 1

## 2014-04-02 MED ORDER — LIDOCAINE HCL (CARDIAC) 20 MG/ML IV SOLN
INTRAVENOUS | Status: DC | PRN
  Administered 2014-04-02: 80 mg via INTRAVENOUS

## 2014-04-02 MED ORDER — LACTATED RINGERS IV SOLN
INTRAVENOUS | Status: DC | PRN
  Administered 2014-04-02 (×2): via INTRAVENOUS

## 2014-04-02 MED ORDER — DEXAMETHASONE SODIUM PHOSPHATE 4 MG/ML IJ SOLN
INTRAMUSCULAR | Status: AC
  Filled 2014-04-02: qty 2

## 2014-04-02 MED ORDER — NITROGLYCERIN 0.4 MG SL SUBL: 0.4000 mg | SUBLINGUAL_TABLET | SUBLINGUAL | Status: DC | PRN

## 2014-04-02 MED ORDER — FENTANYL CITRATE 0.05 MG/ML IJ SOLN
INTRAMUSCULAR | Status: DC | PRN
  Administered 2014-04-02 (×8): 50 ug via INTRAVENOUS

## 2014-04-02 MED ORDER — TRAZODONE HCL 50 MG PO TABS
50.0000 mg | ORAL_TABLET | Freq: Every day | ORAL | Status: DC
  Administered 2014-04-02: 50 mg via ORAL
  Filled 2014-04-02 (×2): qty 1

## 2014-04-02 MED ORDER — DEXMEDETOMIDINE HCL IN NACL 200 MCG/50ML IV SOLN
INTRAVENOUS | Status: AC
  Filled 2014-04-02: qty 50

## 2014-04-02 MED ORDER — OXYCODONE-ACETAMINOPHEN 5-325 MG PO TABS
1.0000 | ORAL_TABLET | ORAL | Status: DC | PRN
  Administered 2014-04-03 (×2): 2 via ORAL
  Filled 2014-04-02 (×2): qty 2

## 2014-04-02 MED ORDER — HEPARIN SOD (PORK) LOCK FLUSH 100 UNIT/ML IV SOLN
INTRAVENOUS | Status: DC | PRN
  Administered 2014-04-02: 500 [IU] via INTRAVENOUS

## 2014-04-02 MED ORDER — MIDAZOLAM HCL 5 MG/5ML IJ SOLN
INTRAMUSCULAR | Status: DC | PRN
  Administered 2014-04-02: 2 mg via INTRAVENOUS

## 2014-04-02 MED ORDER — LACTATED RINGERS IV SOLN
INTRAVENOUS | Status: DC
  Administered 2014-04-02: 09:00:00 via INTRAVENOUS

## 2014-04-02 MED ORDER — SODIUM CHLORIDE 0.9 % IJ SOLN
INTRAMUSCULAR | Status: AC
  Filled 2014-04-02: qty 10

## 2014-04-02 MED ORDER — FENTANYL CITRATE 0.05 MG/ML IJ SOLN
INTRAMUSCULAR | Status: AC
  Filled 2014-04-02: qty 5

## 2014-04-02 MED ORDER — RIZATRIPTAN BENZOATE 5 MG PO TBDP
10.0000 mg | ORAL_TABLET | ORAL | Status: DC | PRN
  Filled 2014-04-02: qty 2

## 2014-04-02 MED ORDER — DEXAMETHASONE SODIUM PHOSPHATE 10 MG/ML IJ SOLN
INTRAMUSCULAR | Status: DC | PRN
  Administered 2014-04-02: 10 mg via INTRAVENOUS

## 2014-04-02 MED ORDER — ROCURONIUM BROMIDE 50 MG/5ML IV SOLN
INTRAVENOUS | Status: AC
  Filled 2014-04-02: qty 1

## 2014-04-02 MED ORDER — PHENYLEPHRINE 40 MCG/ML (10ML) SYRINGE FOR IV PUSH (FOR BLOOD PRESSURE SUPPORT)
PREFILLED_SYRINGE | INTRAVENOUS | Status: AC
  Filled 2014-04-02: qty 10

## 2014-04-02 MED ORDER — HEPARIN SOD (PORK) LOCK FLUSH 100 UNIT/ML IV SOLN
INTRAVENOUS | Status: AC
  Filled 2014-04-02: qty 5

## 2014-04-02 MED ORDER — FENTANYL CITRATE 0.05 MG/ML IJ SOLN
INTRAMUSCULAR | Status: AC
  Administered 2014-04-02: 50 ug via INTRAVENOUS
  Filled 2014-04-02: qty 2

## 2014-04-02 MED ORDER — IPRATROPIUM-ALBUTEROL 0.5-2.5 (3) MG/3ML IN SOLN: 3.0000 mL | Freq: Four times a day (QID) | RESPIRATORY_TRACT | Status: DC | PRN

## 2014-04-02 MED ORDER — METHYLENE BLUE 1 % INJ SOLN
INTRAMUSCULAR | Status: DC | PRN
  Administered 2014-04-02: 10:00:00

## 2014-04-02 MED ORDER — PROPOFOL 10 MG/ML IV BOLUS
INTRAVENOUS | Status: AC
  Filled 2014-04-02: qty 20

## 2014-04-02 MED ORDER — LEVOTHYROXINE SODIUM 75 MCG PO TABS
75.0000 ug | ORAL_TABLET | Freq: Every day | ORAL | Status: DC
  Administered 2014-04-03: 75 ug via ORAL
  Filled 2014-04-02 (×3): qty 1

## 2014-04-02 MED ORDER — ALBUTEROL SULFATE HFA 108 (90 BASE) MCG/ACT IN AERS: 2.0000 | INHALATION_SPRAY | RESPIRATORY_TRACT | Status: DC | PRN

## 2014-04-02 MED ORDER — MIDAZOLAM HCL 2 MG/2ML IJ SOLN
1.0000 mg | INTRAMUSCULAR | Status: DC | PRN
  Administered 2014-04-02: 2 mg via INTRAVENOUS

## 2014-04-02 MED ORDER — 0.9 % SODIUM CHLORIDE (POUR BTL) OPTIME
TOPICAL | Status: DC | PRN
  Administered 2014-04-02 (×2): 1000 mL

## 2014-04-02 MED ORDER — TIZANIDINE HCL 2 MG PO TABS
2.0000 mg | ORAL_TABLET | Freq: Two times a day (BID) | ORAL | Status: DC | PRN
  Filled 2014-04-02: qty 1

## 2014-04-02 MED ORDER — MIDAZOLAM HCL 2 MG/2ML IJ SOLN
INTRAMUSCULAR | Status: AC
  Administered 2014-04-02: 2 mg via INTRAVENOUS
  Filled 2014-04-02: qty 2

## 2014-04-02 MED ORDER — FENTANYL CITRATE 0.05 MG/ML IJ SOLN
INTRAMUSCULAR | Status: AC
  Administered 2014-04-02: 100 ug via INTRAVENOUS
  Filled 2014-04-02: qty 2

## 2014-04-02 MED ORDER — PNEUMOCOCCAL VAC POLYVALENT 25 MCG/0.5ML IJ INJ
0.5000 mL | INJECTION | INTRAMUSCULAR | Status: DC
  Filled 2014-04-02: qty 0.5

## 2014-04-02 MED ORDER — SODIUM CHLORIDE 0.9 % IR SOLN
Status: DC | PRN
  Administered 2014-04-02: 10:00:00

## 2014-04-02 MED ORDER — LIDOCAINE-EPINEPHRINE (PF) 1 %-1:200000 IJ SOLN
INTRAMUSCULAR | Status: AC
  Filled 2014-04-02: qty 10

## 2014-04-02 MED ORDER — ENOXAPARIN SODIUM 40 MG/0.4ML ~~LOC~~ SOLN
40.0000 mg | SUBCUTANEOUS | Status: DC
  Administered 2014-04-03: 40 mg via SUBCUTANEOUS
  Filled 2014-04-02 (×2): qty 0.4

## 2014-04-02 MED ORDER — CEFAZOLIN SODIUM-DEXTROSE 2-3 GM-% IV SOLR
2.0000 g | Freq: Three times a day (TID) | INTRAVENOUS | Status: DC
  Administered 2014-04-02 – 2014-04-03 (×2): 2 g via INTRAVENOUS
  Filled 2014-04-02 (×4): qty 50

## 2014-04-02 MED ORDER — SUCCINYLCHOLINE CHLORIDE 20 MG/ML IJ SOLN
INTRAMUSCULAR | Status: AC
  Filled 2014-04-02: qty 1

## 2014-04-02 MED ORDER — BUPROPION HCL ER (XL) 300 MG PO TB24
300.0000 mg | ORAL_TABLET | Freq: Every day | ORAL | Status: DC
  Administered 2014-04-03: 300 mg via ORAL
  Filled 2014-04-02: qty 1

## 2014-04-02 MED ORDER — ONDANSETRON HCL 4 MG/2ML IJ SOLN
INTRAMUSCULAR | Status: DC | PRN
  Administered 2014-04-02: 4 mg via INTRAVENOUS

## 2014-04-02 MED ORDER — MIDAZOLAM HCL 2 MG/2ML IJ SOLN
INTRAMUSCULAR | Status: AC
  Filled 2014-04-02: qty 2

## 2014-04-02 MED ORDER — FENTANYL CITRATE 0.05 MG/ML IJ SOLN
25.0000 ug | INTRAMUSCULAR | Status: DC | PRN
  Administered 2014-04-02: 50 ug via INTRAVENOUS

## 2014-04-02 MED ORDER — ALBUTEROL SULFATE (2.5 MG/3ML) 0.083% IN NEBU: 2.5000 mg | INHALATION_SOLUTION | RESPIRATORY_TRACT | Status: DC | PRN

## 2014-04-02 MED ORDER — INFLUENZA VAC SPLIT QUAD 0.5 ML IM SUSY
0.5000 mL | PREFILLED_SYRINGE | INTRAMUSCULAR | Status: DC
  Filled 2014-04-02: qty 0.5

## 2014-04-02 MED ORDER — ONDANSETRON HCL 4 MG PO TABS: 4.0000 mg | ORAL_TABLET | Freq: Four times a day (QID) | ORAL | Status: DC | PRN

## 2014-04-02 MED ORDER — SUCCINYLCHOLINE CHLORIDE 20 MG/ML IJ SOLN
INTRAMUSCULAR | Status: DC | PRN
  Administered 2014-04-02: 100 mg via INTRAVENOUS

## 2014-04-02 MED ORDER — GLYCOPYRROLATE 0.2 MG/ML IJ SOLN
INTRAMUSCULAR | Status: DC | PRN
  Administered 2014-04-02: .2 mg via INTRAVENOUS

## 2014-04-02 MED ORDER — MORPHINE SULFATE 2 MG/ML IJ SOLN
1.0000 mg | INTRAMUSCULAR | Status: DC | PRN
  Administered 2014-04-02 – 2014-04-03 (×4): 2 mg via INTRAVENOUS
  Filled 2014-04-02 (×4): qty 1

## 2014-04-02 MED ORDER — SUMATRIPTAN SUCCINATE 6 MG/0.5ML ~~LOC~~ SOLN
6.0000 mg | SUBCUTANEOUS | Status: DC | PRN
  Filled 2014-04-02: qty 0.5

## 2014-04-02 MED ORDER — TECHNETIUM TC 99M SULFUR COLLOID FILTERED
1.0000 | Freq: Once | INTRAVENOUS | Status: AC | PRN
  Administered 2014-04-02: 1 via INTRADERMAL

## 2014-04-02 MED ORDER — HYDROMORPHONE HCL PF 1 MG/ML IJ SOLN: 0.2500 mg | INTRAMUSCULAR | Status: DC | PRN

## 2014-04-02 MED ORDER — DEXMEDETOMIDINE HCL 200 MCG/2ML IV SOLN
INTRAVENOUS | Status: DC | PRN
  Administered 2014-04-02 (×4): 4 ug via INTRAVENOUS

## 2014-04-02 MED ORDER — ATORVASTATIN CALCIUM 10 MG PO TABS
10.0000 mg | ORAL_TABLET | Freq: Every day | ORAL | Status: DC
  Administered 2014-04-02 – 2014-04-03 (×2): 10 mg via ORAL
  Filled 2014-04-02 (×2): qty 1

## 2014-04-02 MED ORDER — ONDANSETRON HCL 4 MG/2ML IJ SOLN: 4.0000 mg | Freq: Four times a day (QID) | INTRAMUSCULAR | Status: DC | PRN

## 2014-04-02 MED ORDER — ONDANSETRON HCL 4 MG/2ML IJ SOLN
INTRAMUSCULAR | Status: AC
  Filled 2014-04-02: qty 2

## 2014-04-02 MED ORDER — PROPOFOL 10 MG/ML IV BOLUS
INTRAVENOUS | Status: DC | PRN
  Administered 2014-04-02: 100 mg via INTRAVENOUS

## 2014-04-02 MED ORDER — BUDESONIDE-FORMOTEROL FUMARATE 160-4.5 MCG/ACT IN AERO
2.0000 | INHALATION_SPRAY | Freq: Two times a day (BID) | RESPIRATORY_TRACT | Status: DC
  Administered 2014-04-02: 2 via RESPIRATORY_TRACT
  Filled 2014-04-02: qty 6

## 2014-04-02 MED ORDER — NEOSTIGMINE METHYLSULFATE 10 MG/10ML IV SOLN
INTRAVENOUS | Status: DC | PRN
Start: 2014-04-02 — End: 2014-04-02
  Administered 2014-04-02: 2 mg via INTRAVENOUS

## 2014-04-02 MED ORDER — PHENYLEPHRINE HCL 10 MG/ML IJ SOLN
INTRAMUSCULAR | Status: DC | PRN
  Administered 2014-04-02 (×2): 80 ug via INTRAVENOUS

## 2014-04-02 MED ORDER — ALBUMIN HUMAN 5 % IV SOLN
INTRAVENOUS | Status: DC | PRN
  Administered 2014-04-02: 10:00:00 via INTRAVENOUS

## 2014-04-02 MED ORDER — ROCURONIUM BROMIDE 100 MG/10ML IV SOLN
INTRAVENOUS | Status: DC | PRN
  Administered 2014-04-02: 20 mg via INTRAVENOUS

## 2014-04-02 MED ORDER — METHYLENE BLUE 1 % INJ SOLN
INTRAMUSCULAR | Status: AC
  Filled 2014-04-02: qty 10

## 2014-04-02 MED ORDER — PHENYLEPHRINE HCL 10 MG/ML IJ SOLN
10.0000 mg | INTRAVENOUS | Status: DC | PRN
  Administered 2014-04-02: 25 ug/min via INTRAVENOUS

## 2014-04-02 MED ORDER — VERAPAMIL HCL ER 120 MG PO TBCR
120.0000 mg | EXTENDED_RELEASE_TABLET | Freq: Every day | ORAL | Status: DC
  Administered 2014-04-02 – 2014-04-03 (×2): 120 mg via ORAL
  Filled 2014-04-02 (×2): qty 1

## 2014-04-02 MED ORDER — FENTANYL CITRATE 0.05 MG/ML IJ SOLN
50.0000 ug | INTRAMUSCULAR | Status: DC | PRN
  Administered 2014-04-02: 100 ug via INTRAVENOUS

## 2014-04-02 SURGICAL SUPPLY — 96 items
ADH SKN CLS APL DERMABOND .7 (GAUZE/BANDAGES/DRESSINGS) ×6
APPLIER CLIP 9.375 MED OPEN (MISCELLANEOUS) ×3
APR CLP MED 9.3 20 MLT OPN (MISCELLANEOUS) ×2
BAG DECANTER FOR FLEXI CONT (MISCELLANEOUS) ×3 IMPLANT
BINDER BREAST LRG (GAUZE/BANDAGES/DRESSINGS) IMPLANT
BINDER BREAST XLRG (GAUZE/BANDAGES/DRESSINGS) ×1 IMPLANT
BLADE SURG 10 STRL SS (BLADE) ×1 IMPLANT
BLADE SURG 11 STRL SS (BLADE) ×1 IMPLANT
BLADE SURG 15 STRL LF DISP TIS (BLADE) IMPLANT
BLADE SURG 15 STRL SS (BLADE) ×3
BLADE SURG ROTATE 9660 (MISCELLANEOUS) IMPLANT
CANISTER SUCTION 2500CC (MISCELLANEOUS) ×3 IMPLANT
CHLORAPREP W/TINT 10.5 ML (MISCELLANEOUS) ×2 IMPLANT
CHLORAPREP W/TINT 26ML (MISCELLANEOUS) ×4 IMPLANT
CLIP APPLIE 9.375 MED OPEN (MISCELLANEOUS) ×2 IMPLANT
CONT SPEC 4OZ CLIKSEAL STRL BL (MISCELLANEOUS) ×3 IMPLANT
CONT SPECI 4OZ STER CLIK (MISCELLANEOUS) ×3 IMPLANT
COVER MAYO STAND STRL (DRAPES) ×1 IMPLANT
COVER PROBE W GEL 5X96 (DRAPES) ×4 IMPLANT
COVER SURGICAL LIGHT HANDLE (MISCELLANEOUS) ×3 IMPLANT
CRADLE DONUT ADULT HEAD (MISCELLANEOUS) ×3 IMPLANT
DECANTER SPIKE VIAL GLASS SM (MISCELLANEOUS) ×2 IMPLANT
DERMABOND ADVANCED (GAUZE/BANDAGES/DRESSINGS) ×3
DERMABOND ADVANCED .7 DNX12 (GAUZE/BANDAGES/DRESSINGS) ×2 IMPLANT
DRAIN CHANNEL 19F RND (DRAIN) ×6 IMPLANT
DRAPE C-ARM 42X72 X-RAY (DRAPES) ×3 IMPLANT
DRAPE CHEST BREAST 15X10 FENES (DRAPES) ×3 IMPLANT
DRAPE LAPAROSCOPIC ABDOMINAL (DRAPES) ×3 IMPLANT
DRAPE PROXIMA HALF (DRAPES) ×2 IMPLANT
DRAPE UTILITY 15X26 W/TAPE STR (DRAPE) ×8 IMPLANT
DRSG PAD ABDOMINAL 8X10 ST (GAUZE/BANDAGES/DRESSINGS) ×4 IMPLANT
ELECT BLADE 4.0 EZ CLEAN MEGAD (MISCELLANEOUS) ×3
ELECT CAUTERY BLADE 6.4 (BLADE) ×4 IMPLANT
ELECT REM PT RETURN 9FT ADLT (ELECTROSURGICAL) ×3
ELECTRODE BLDE 4.0 EZ CLN MEGD (MISCELLANEOUS) ×2 IMPLANT
ELECTRODE REM PT RTRN 9FT ADLT (ELECTROSURGICAL) ×2 IMPLANT
EVACUATOR SILICONE 100CC (DRAIN) ×6 IMPLANT
GAUZE SPONGE 4X4 16PLY XRAY LF (GAUZE/BANDAGES/DRESSINGS) ×3 IMPLANT
GLOVE BIO SURGEON STRL SZ7 (GLOVE) ×3 IMPLANT
GLOVE BIO SURGEON STRL SZ7.5 (GLOVE) ×1 IMPLANT
GLOVE BIOGEL PI IND STRL 6 (GLOVE) IMPLANT
GLOVE BIOGEL PI IND STRL 6.5 (GLOVE) IMPLANT
GLOVE BIOGEL PI IND STRL 7.0 (GLOVE) IMPLANT
GLOVE BIOGEL PI IND STRL 7.5 (GLOVE) IMPLANT
GLOVE BIOGEL PI INDICATOR 6 (GLOVE) ×2
GLOVE BIOGEL PI INDICATOR 6.5 (GLOVE) ×1
GLOVE BIOGEL PI INDICATOR 7.0 (GLOVE) ×3
GLOVE BIOGEL PI INDICATOR 7.5 (GLOVE) ×1
GLOVE EUDERMIC 7 POWDERFREE (GLOVE) ×5 IMPLANT
GOWN STRL REUS W/ TWL LRG LVL3 (GOWN DISPOSABLE) ×4 IMPLANT
GOWN STRL REUS W/ TWL XL LVL3 (GOWN DISPOSABLE) ×2 IMPLANT
GOWN STRL REUS W/TWL LRG LVL3 (GOWN DISPOSABLE) ×12
GOWN STRL REUS W/TWL XL LVL3 (GOWN DISPOSABLE) ×9
INTRODUCER 13FR (MISCELLANEOUS) IMPLANT
INTRODUCER COOK 11FR (CATHETERS) IMPLANT
KIT BASIN OR (CUSTOM PROCEDURE TRAY) ×3 IMPLANT
KIT PORT POWER 8FR ISP CVUE (Catheter) ×1 IMPLANT
KIT PORT POWER 9.6FR MRI PREA (Catheter) IMPLANT
KIT PORT POWER ISP 8FR (Catheter) IMPLANT
KIT POWER CATH 8FR (Catheter) IMPLANT
KIT ROOM TURNOVER OR (KITS) ×3 IMPLANT
NDL 18GX1X1/2 (RX/OR ONLY) (NEEDLE) ×2 IMPLANT
NDL HYPO 25GX1X1/2 BEV (NEEDLE) ×2 IMPLANT
NEEDLE 18GX1X1/2 (RX/OR ONLY) (NEEDLE) ×3 IMPLANT
NEEDLE HYPO 25GX1X1/2 BEV (NEEDLE) ×3 IMPLANT
NS IRRIG 1000ML POUR BTL (IV SOLUTION) ×5 IMPLANT
PACK GENERAL/GYN (CUSTOM PROCEDURE TRAY) ×3 IMPLANT
PACK SURGICAL SETUP 50X90 (CUSTOM PROCEDURE TRAY) ×2 IMPLANT
PAD ARMBOARD 7.5X6 YLW CONV (MISCELLANEOUS) ×3 IMPLANT
PENCIL BUTTON HOLSTER BLD 10FT (ELECTRODE) ×3 IMPLANT
SET INTRODUCER 12FR PACEMAKER (SHEATH) IMPLANT
SET SHEATH INTRODUCER 10FR (MISCELLANEOUS) IMPLANT
SHEATH COOK PEEL AWAY SET 9F (SHEATH) IMPLANT
SPECIMEN JAR X LARGE (MISCELLANEOUS) ×4 IMPLANT
SPONGE GAUZE 4X4 12PLY STER LF (GAUZE/BANDAGES/DRESSINGS) ×2 IMPLANT
SPONGE LAP 18X18 X RAY DECT (DISPOSABLE) ×2 IMPLANT
STAPLER VISISTAT 35W (STAPLE) ×2 IMPLANT
SURGILUBE 3G PEEL PACK STRL (MISCELLANEOUS) IMPLANT
SUT ETHILON 3 0 FSL (SUTURE) ×6 IMPLANT
SUT MNCRL AB 4-0 PS2 18 (SUTURE) ×6 IMPLANT
SUT PROLENE 2 0 CT2 30 (SUTURE) ×4 IMPLANT
SUT SILK 2 0 FS (SUTURE) ×4 IMPLANT
SUT VIC AB 3-0 SH 18 (SUTURE) ×5 IMPLANT
SUT VIC AB 3-0 SH 27 (SUTURE) ×3
SUT VIC AB 3-0 SH 27XBRD (SUTURE) IMPLANT
SYR 20ML ECCENTRIC (SYRINGE) ×2 IMPLANT
SYR 5ML LUER SLIP (SYRINGE) ×3 IMPLANT
SYR BULB 3OZ (MISCELLANEOUS) ×2 IMPLANT
SYR CONTROL 10ML LL (SYRINGE) ×5 IMPLANT
SYRINGE 10CC LL (SYRINGE) ×2 IMPLANT
TOWEL OR 17X24 6PK STRL BLUE (TOWEL DISPOSABLE) ×2 IMPLANT
TOWEL OR 17X26 10 PK STRL BLUE (TOWEL DISPOSABLE) ×3 IMPLANT
TRAY FOLEY CATH 14FR (SET/KITS/TRAYS/PACK) ×1 IMPLANT
TUBE CONNECTING 12X1/4 (SUCTIONS) IMPLANT
WATER STERILE IRR 1000ML POUR (IV SOLUTION) IMPLANT
YANKAUER SUCT BULB TIP NO VENT (SUCTIONS) IMPLANT

## 2014-04-02 NOTE — Interval H&P Note (Signed)
History and Physical Interval Note:  04/02/2014 8:48 AM  Stacy Fernandez  has presented today for surgery, with the diagnosis of LEFT BREAST CANCER  The goals and the various methods of treatment have been discussed with the patient and family. After consideration of risks, benefits and other options for treatment, the patient has consented to  Procedure(s): TOTAL LEFT  MASTECTOMY WITH LEFT AXILLARY SENTINEL NODE BIOPSY (Left) INSERTION PORT-A-CATH (N/A) ,   RIGHT PROPHYLACTIC MASTECTOMY  as a surgical intervention .  She is strongly motivated to have bilateral mastectomies.   Long discussion with patient and husband about extent of surgery and bilateral procedures.   Risks and outcomes again reviewed.  This is reasonable approach in her individual situation.   The patient's history has been reviewed, patient is examined today, no change in status, stable for surgery.  I have reviewed the patient's chart and labs.  Questions were answered to the patient's satisfaction.     Adin Hector

## 2014-04-02 NOTE — Anesthesia Preprocedure Evaluation (Addendum)
Anesthesia Evaluation  Patient identified by MRN, date of birth, ID band Patient awake    Reviewed: Allergy & Precautions, H&P , NPO status , Patient's Chart, lab work & pertinent test results, reviewed documented beta blocker date and time   Airway Mallampati: III TM Distance: >3 FB     Dental  (+) Teeth Intact, Dental Advisory Given   Pulmonary shortness of breath and with exertion, COPD COPD inhaler, Current Smoker,  breath sounds clear to auscultation        Cardiovascular + angina with exertion + CAD and + Peripheral Vascular Disease Rhythm:Regular     Neuro/Psych  Headaches, Anxiety    GI/Hepatic negative GI ROS, Neg liver ROS,   Endo/Other  negative endocrine ROSHypothyroidism   Renal/GU negative Renal ROS     Musculoskeletal negative musculoskeletal ROS (+)   Abdominal (+)  Abdomen: soft. Bowel sounds: normal.  Peds  Hematology negative hematology ROS (+)   Anesthesia Other Findings   Reproductive/Obstetrics negative OB ROS                        Anesthesia Physical Anesthesia Plan  ASA: III  Anesthesia Plan: General   Post-op Pain Management:    Induction: Intravenous  Airway Management Planned: Oral ETT  Additional Equipment:   Intra-op Plan:   Post-operative Plan:   Informed Consent:   Dental advisory given  Plan Discussed with: CRNA and Anesthesiologist  Anesthesia Plan Comments:         Anesthesia Quick Evaluation

## 2014-04-02 NOTE — Op Note (Signed)
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 Clinical stage TI C., NX   non-mass enhancement on MRI  extending 4.5 anteriorly from the primary tumor, . 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 BRCA1/BRCA2 negative   Post op Diagnosis:    Same   Procedure:                 Insertion of 8 [place] [employer] tunneled venous vascular access device Use of fluoroscopy for guidance and positioning Inject blue dye left breast Left total mastectomy Left axillary sentinel lymph node biopsy Prophylactic right mastectomy  Surgeon:                     Edsel Petrin. Dalbert Batman, M.D., FACS  Assistant:                      Sharyn Dross, RNFA  Operative Indications:   Stacy Fernandez is a 53 y.o. female. She is referred by Dr. Elige Radon at [facility] for evaluation and management of an invasive cancer of the left breast laterally at the 9:00 position with possible additional DCIS anterior to that. Dr. Charleston Poot is her PCP. Dr. Elsworth Soho is her pulmonologist. Dr. Floyde Parkins is her neurologist.  She was evaluated recently in the [facility] today by Dr. Pablo Ledger, Dr. Jana Hakim, and me  The patient's last mammogram was 2 years ago. She's had some pain and dimpling in the lateral aspect of the left breast for a little while. Recent mammograms and ultrasound show a 1.1 cm mass in the left breast laterally some skin dimpling was noted. Image guided biopsy showed grade 3 invasive ductal carcinoma, HER-2 positive, receptor negative. Subsequent MRI showed a 1.8 cm mass in the left breast at 3:00 position and a 4.5 cm area of additional linear enhancement without mass extending anteriorly from the primary cancer, The most anterior aspect of this linear enhancement that was biopsied under MRI guidance and shows ductal carcinoma in situ.  Lymph nodes are negative radiographically.      Family history reveals ovarian cancer in a sister and a niece. Stomach cancer in her father. THe sister had colon cancer. Has head and neck cancer in the family. Nobody has had breast cancer          Comorbidities include aneurysm in the brain followed by Dr. Jannifer Franklin. Migraine headaches necessitating Botox injections from time to time. Coronary artery Disease. COPD. Hypertension.      She smokes cigarettes but denies alcohol  Operative Findings:       The port was placed through the right subclavian vein without apparent difficulty and fluoroscopic imaging showed proper positioning of the catheter tip in the superior vena cava near the right atrial junction. On the left side I found 3 sentinel lymph nodes which were hot and blue. Both the left and the right breast were grossly normal during the dissection.  Procedure in Detail:          The patient underwent injection of radionuclide into the left breast by the nuclear medicine technician in the holding area. The patient was then brought to the operating room and underwent general endotracheal anesthesia. She was positioned with a roll  behind her shoulders and her arms tucked at her sides. The neck and chest were prepped and draped in a sterile fashion. Intravenous antibiotics were given. Surgical time out was performed.     A right subclavian venipuncture was performed. We had good blood return and the guidewire threaded easily. Fluoroscopy confirmed the tip of the wire within the vena cava. A small incision was made at  the wire insertion site. A transverse incision was made about 2 cm below the medial clavicle. Subcutaneous pocket was created. Using a tunneling device I passed the catheter from the wire insertion site to the port pocket site. Fluoroscopy was used to draw a template on the chest wall to help guide  positioning of the catheter. Using the template as a guide I then cut the catheter 20.5 cm in  length and secured the catheter to the port with a locking device and flushed it with heparinized saline. The port was sutured to the pectoralis fascia with 3 interrupted sutures of 2-0 Prolene. The dilator and peel-away sheath were inserted over the guidewire into the central venous circulation without difficulty. The dilator and guidewire were removed and the catheter threaded into the superior vena cava without difficulty and the peel-away sheath was removed. The catheter flushed easily and had excellent blood return. Fluoroscopy  confirmed good positioning of the catheter tip in the superior vena cava near the right atrial junction with no deformity of the catheter along its course. The port and catheter were flushed with concentrated heparinized saline. Subcutaneous tissue was closed with 3-0 Vicryl sutures and the skin closed with subcuticular 4-0 Monocryl and Dermabond.     We then repositioned the patient with her arms out at her sides we then prepped and draped the neck and chest and axilla and chest wall again. Another surgical time out performed.     Using a marking pen I carefully drew anatomical boundaries of the mastectomy and drew mirror-image transverse elliptical incisions around the nipple areolar complexes. I will describe the dissection once since the mastectomy technique was essentially identical on each side. Transverse elliptical incisions were made. Skin flaps were raised superiorly to the infraclavicular area, medially to the parasternal area, inferiorly  to the anterior rectus sheath and laterally to the latissimus dorsi muscle. On each side the breast was dissected off of the pectoralis major and minor muscles including the tail of Spence. On each side the lateral skin margin was marked with a silk suture. The mastectomy specimens were sent separately. The left side using the neoprobe I dissected up into the axilla and found 3 sentinel lymph nodes, all of which were very blue and hot  these were sent separately to the lab as separate specimens. On each side was were copiously irrigated. Hemostasis was excellent and had been achieved with electrocautery and metal clips. On each side I placed two 19 [place] [employer] drains. One drain was passed up into the axilla and one across the skin flaps. On each side these were brought out through separate stab incisions inferolaterally, sutured to the skin with nylon sutures and connected to suction bulbs. Both mastectomy incisions were closed in 2 layers. An inner layer of interrupted 3-0 Vicryl for the subcutaneous tissue and a running 4-0 Monocryl for the skin. Dermabond was placed. The drains worked  well and had a good charge and there was no air leak or significant bleeding. Clean banages and a  breast binder were placed. The patient will be taken to the recovery  room where chest x-ray will be obtained. Estimated blood loss 150 cc. Counts correct. Complications none.     Edsel Petrin. Dalbert Batman, M.D., FACS General and Minimally Invasive Surgery Breast and Colorectal Surgery  04/02/2014 1:10 PM

## 2014-04-02 NOTE — Anesthesia Postprocedure Evaluation (Signed)
  Anesthesia Post-op Note  Patient: Stacy Fernandez  Procedure(s) Performed: Procedure(s): LEFT TOTAL MASTECTOMY WITH LEFT AXILLARY SENTINEL NODE BIOPSY, RIGHT PROPHYLACTIC MASTETCTOMY (Bilateral) INSERTION PORT-A-CATH (N/A)  Patient Location: PACU  Anesthesia Type:General  Level of Consciousness: awake  Airway and Oxygen Therapy: Patient Spontanous Breathing  Post-op Pain: mild  Post-op Assessment: Post-op Vital signs reviewed  Post-op Vital Signs: Reviewed  Last Vitals:  Filed Vitals:   04/02/14 1530  BP: 113/63  Pulse: 93  Temp: 36.4 C  Resp: 17    Complications: No apparent anesthesia complications

## 2014-04-02 NOTE — Transfer of Care (Signed)
Immediate Anesthesia Transfer of Care Note  Patient: Stacy Fernandez  Procedure(s) Performed: Procedure(s): LEFT TOTAL MASTECTOMY WITH LEFT AXILLARY SENTINEL NODE BIOPSY, RIGHT PROPHYLACTIC MASTETCTOMY (Bilateral) INSERTION PORT-A-CATH (N/A)  Patient Location: PACU  Anesthesia Type:General  Level of Consciousness: awake, alert  and oriented  Airway & Oxygen Therapy: Patient connected to face mask oxygen  Post-op Assessment: Report given to PACU RN  Post vital signs: stable  Complications: No apparent anesthesia complications

## 2014-04-02 NOTE — Progress Notes (Signed)
Report given to [facility], CRNA she is aware of patient's BP

## 2014-04-03 ENCOUNTER — Telehealth (INDEPENDENT_AMBULATORY_CARE_PROVIDER_SITE_OTHER): Payer: Self-pay

## 2014-04-03 DIAGNOSIS — C50419 Malignant neoplasm of upper-outer quadrant of unspecified female breast: Secondary | ICD-10-CM | POA: Diagnosis not present

## 2014-04-03 LAB — CBC
HEMATOCRIT: 29.9 % — AB (ref 36.0–46.0)
Hemoglobin: 10.1 g/dL — ABNORMAL LOW (ref 12.0–15.0)
MCH: 31.1 pg (ref 26.0–34.0)
MCHC: 33.8 g/dL (ref 30.0–36.0)
MCV: 92 fL (ref 78.0–100.0)
PLATELETS: 194 10*3/uL (ref 150–400)
RBC: 3.25 MIL/uL — ABNORMAL LOW (ref 3.87–5.11)
RDW: 13.5 % (ref 11.5–15.5)
WBC: 7.2 10*3/uL (ref 4.0–10.5)

## 2014-04-03 LAB — BASIC METABOLIC PANEL
Anion gap: 17 — ABNORMAL HIGH (ref 5–15)
BUN: 8 mg/dL (ref 6–23)
CO2: 19 meq/L (ref 19–32)
CREATININE: 0.71 mg/dL (ref 0.50–1.10)
Calcium: 8.6 mg/dL (ref 8.4–10.5)
Chloride: 106 mEq/L (ref 96–112)
GFR calc Af Amer: >90 mL/min (ref >90)
GLUCOSE: 162 mg/dL — AB (ref 70–99)
Potassium: 4.1 mEq/L (ref 3.7–5.3)
Sodium: 142 mEq/L (ref 137–147)

## 2014-04-03 MED ORDER — OXYCODONE-ACETAMINOPHEN 7.5-325 MG PO TABS: 1.0000 | ORAL_TABLET | ORAL | Status: DC | PRN

## 2014-04-03 MED ORDER — INFLUENZA VAC SPLIT QUAD 0.5 ML IM SUSY
0.5000 mL | PREFILLED_SYRINGE | Freq: Once | INTRAMUSCULAR | Status: AC
  Administered 2014-04-03: 0.5 mL via INTRAMUSCULAR
  Filled 2014-04-03: qty 0.5

## 2014-04-03 MED ORDER — PNEUMOCOCCAL VAC POLYVALENT 25 MCG/0.5ML IJ INJ
0.5000 mL | INJECTION | Freq: Once | INTRAMUSCULAR | Status: AC
  Administered 2014-04-03: 0.5 mL via INTRAMUSCULAR

## 2014-04-03 NOTE — Discharge Instructions (Signed)
See above

## 2014-04-03 NOTE — Telephone Encounter (Signed)
Called patient with appointment scheduled for 04/11/14 @ 1:00pm as discussed.  Patient to arrive at 12:30 for registration.

## 2014-04-03 NOTE — Discharge Summary (Signed)
Patient ID: Stacy Fernandez [id number] 53 y.o. [birth year]  Admit date: 04/02/2014  Discharge date and time: 04/03/2014  Admitting Physician: Adin Hector  Discharge Physician: Adin Hector  Admission Diagnoses: Left breast cancer Migraine headaches  Coronary artery disease  COPD  Ongoing tobacco use  Hypertension  Cerebral aneurysm  Benign hematuria  Strong family history for multiple cancers    Discharge Diagnoses: Invasive ductal carcinoma left breast, 3:00 position, grade 3, her-2  positive, receptor negative. Clinical stage T1c, Nx.. 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  Operations: Procedure(s): LEFT TOTAL MASTECTOMY WITH LEFT AXILLARY SENTINEL NODE BIOPSY, RIGHT PROPHYLACTIC MASTETCTOMY INSERTION PORT-A-CATH  Admission Condition: good  Discharged Condition: good  Indication for Admission: Stacy Fernandez is a 53 y.o. female. She is referred by Dr. 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 Dr. Charleston Poot is her PCP. Dr. Elsworth Soho is her pulmonologist. Dr. Floyde Parkins is her neurologist.  She was evaluated recently in the [facility]  by Dr. Pablo Ledger, Dr. Jana Hakim, and me      The patient's last mammogram was 2 years ago. She's had some pain and dimpling in the lateral aspect of the left breast for a little while. Recent mammograms and ultrasound show a 1.1 cm mass in the left breast laterally some skin dimpling was noted. Image guided biopsy showed grade 3 invasive ductal carcinoma, HER-2 positive, receptor negative. Subsequent MRI showed a 1.8 cm mass in the left breast at 3:00 position and a 4.5 cm area of additional linear enhancement without mass extending anteriorly from the primary cancer, The most anterior aspect of this linear enhancement that was biopsied under MRI guidance and  shows ductal carcinoma in situ. Lymph nodes are negative radiographically.     Genetic testing is negative.    She has been evaluated by plastic surgery who advised that she have delayed reconstruction.    The patient is strongly motivated for bilateral mastectomy. We have discussed her options and approaches extensively. She is brought to the operating room electively for Port-A-Cath insertion, left mastectomy with sentinel lymph biopsy, right prophylactic mastectomy.    Family history reveals ovarian cancer in a sister and a niece. Stomach cancer in her father. THe sister had colon cancer. Has head and neck cancer in the family. Nobody has had breast cancer  Comorbidities include aneurysm in the brain followed by Dr. Jannifer Franklin. Migraine headaches necessitating Botox injections from time to time. Coronary artery Disease. COPD. Hypertension.  She smokes cigarettes but denies alcohol   Hospital Course: On the day of admission the patient was taken to the operating room. She underwent Port-A-Cath insertion, left total mastectomy with sentinel lymph node biopsy, right prophylactic mastectomy. The surgery was uneventful. The postop chest x-ray shows good positioning of the Port-A-Cath. Patient was observed overnight and did very well. No nausea or vomiting. She was able to tolerate diet. Pain was controlled. She was ambulatory. She requested discharge home on postop day 1.    On postop day one all 4 drains were functioning within serosanguineous fluid. And the Port-A-Cath site looks good. Both mastectomy skin flaps were healthy and viable.  There was no retained fluid or hematoma.  She  had no complaints of swelling or numbness. She was given a prescription for Percocet for pain. Diet and activities were discussed. Drain care was taught. She will  see me in the office in one week for wound and drain check.  Consults: None  Significant Diagnostic Studies: Surgical pathology, pending  Treatments: surgery:  Port-A-Cath insertion, left total mastectomy, left axillary sentinel lead biopsy, right prophylactic mastectomy  Disposition: Home  Patient Instructions:    Medication List         atorvastatin 10 MG tablet  Commonly known as:  LIPITOR  Take 10 mg by mouth daily.     budesonide-formoterol 160-4.5 MCG/ACT inhaler  Commonly known as:  SYMBICORT  Inhale 2 puffs into the lungs 2 (two) times daily.     buPROPion 150 MG 24 hr tablet  Commonly known as:  WELLBUTRIN XL  Take 300 mg by mouth daily.     HYDROcodone-homatropine 5-1.5 MG/5ML syrup  Commonly known as:  HYCODAN  Take 5 mLs by mouth every 6 (six) hours as needed for cough.     ipratropium-albuterol 0.5-2.5 (3) MG/3ML Soln  Commonly known as:  DUONEB  Take 3 mLs by nebulization every 6 (six) hours as needed.     levothyroxine 75 MCG tablet  Commonly known as:  SYNTHROID, LEVOTHROID  Take 75 mcg by mouth daily before breakfast.     montelukast 10 MG tablet  Commonly known as:  SINGULAIR  Take 10 mg by mouth at bedtime.     nitroGLYCERIN 0.4 MG SL tablet  Commonly known as:  NITROSTAT  Place 0.4 mg under the tongue every 5 (five) minutes as needed for chest pain.     oxyCODONE-acetaminophen 7.5-325 MG per tablet  Commonly known as:  PERCOCET  Take 1 tablet by mouth every 4 (four) hours as needed for pain.     PROAIR HFA 108 (90 BASE) MCG/ACT inhaler  Generic drug:  albuterol  Inhale 2 puffs into the lungs every 4 (four) hours as needed for wheezing or shortness of breath.     rizatriptan 10 MG disintegrating tablet  Commonly known as:  MAXALT-MLT  Take 1 tablet (10 mg total) by mouth as needed for migraine. May repeat in 2 hours if needed     SUMAtriptan 6 MG/0.5ML Soln injection  Commonly known as:  IMITREX  Inject 6 mg into the skin every 2 (two) hours as needed for migraine or headache. May repeat in 2 hours if headache persists or recurs. Max 2 doses in 24 hours     tiZANidine 2 MG tablet  Commonly known  as:  ZANAFLEX  Take 1 tablet (2 mg total) by mouth 2 (two) times daily as needed for muscle spasms.     traZODone 50 MG tablet  Commonly known as:  DESYREL  Take 1 tablet (50 mg total) by mouth at bedtime.     verapamil 120 MG CR tablet  Commonly known as:  CALAN-SR  Take 1 tablet (120 mg total) by mouth daily.        Activity: activity as tolerated Diet: low fat, low cholesterol diet Wound Care: as directed  Follow-up:  With Dr. Dalbert Batman in 1 week.  Signed: Edsel Petrin. Dalbert Batman, M.D., FACS General and minimally invasive surgery Breast and Colorectal Surgery  04/03/2014, 6:24 AM

## 2014-04-03 NOTE — Progress Notes (Signed)
Discussed discharge summary with patient. Reviewed all medications with patient. Educated patient and husband on JP drainage. Provided patient teach back. Patient received Rx. Patient ready for discharge.

## 2014-04-04 ENCOUNTER — Ambulatory Visit: Payer: BC Managed Care – PPO | Admitting: Pulmonary Disease

## 2014-04-05 ENCOUNTER — Encounter (HOSPITAL_COMMUNITY): Payer: Self-pay | Admitting: General Surgery

## 2014-04-05 NOTE — Progress Notes (Signed)
Quick Note:  Inform patient of Pathology report,.Tell her that the invasive cancer was 1.5 cm. Diameter plus there was DCIS, which we knew. Nodes are negative. Margins are negative. There was no cancer in the contralateral prophylactic mastectomy. All good news. Begin referral back to medical oncology in 3-4 weeks.    hmi ______

## 2014-04-15 ENCOUNTER — Encounter: Payer: Self-pay | Admitting: Genetic Counselor

## 2014-04-15 DIAGNOSIS — C50919 Malignant neoplasm of unspecified site of unspecified female breast: Secondary | ICD-10-CM

## 2014-04-15 DIAGNOSIS — Z800 Family history of malignant neoplasm of digestive organs: Secondary | ICD-10-CM

## 2014-04-15 DIAGNOSIS — Z8041 Family history of malignant neoplasm of ovary: Secondary | ICD-10-CM

## 2014-04-15 DIAGNOSIS — Z803 Family history of malignant neoplasm of breast: Secondary | ICD-10-CM

## 2014-04-15 NOTE — Progress Notes (Signed)
HPI:  Stacy Fernandez was previously seen in the [facility] due to a personal and family history of cancer and concerns regarding a hereditary predisposition to cancer. Please refer to our prior cancer genetics clinic note for more information regarding Stacy Fernandez's medical, social and family histories, and our assessment and recommendations, at the time. Stacy Fernandez recent genetic test results were disclosed to her, as were recommendations warranted by these results. These results and recommendations are discussed in more detail below.  GENETIC TEST RESULTS: At the time of Stacy Fernandez visit, we recommended she pursue genetic testing of the [employer] gene panel. This test, which included sequencing and deletion/duplication analysis of the genes, was performed at [facility]. Genetic testing was normal, and did not reveal a deleterious mutation in these genes. A complete list of all genes tested is located on the test report scanned into [employer].    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 At this time, it is unknown if this variant is associated with an increased risk for cancer or if this is a normal finding. With time, we suspect the lab will reclassify this variant and when they do, we will try to re-contact Stacy Fernandez to discuss the reclassification further.   We discussed with Stacy Fernandez that since the current genetic testing is not perfect, it is possible there may be a gene mutation in one of these genes that current testing cannot detect, but that chance is small.  We also discussed, that it is possible that another gene that has not yet been discovered, or that we have not yet tested, is responsible for the cancer diagnoses in the family, and it is, therefore, important to remain in touch with cancer genetics in the future so that we can continue to offer Stacy Fernandez the most up to date genetic testing.   CANCER SCREENING  RECOMMENDATIONS: This result is generally reassuring for her and her children and suggests that Stacy Fernandez's cancer was most likely not due to an inherited predisposition associated with one of these genes.  Most cancers happen by chance and this negative test suggests that her cancer falls into this category.  We, therefore, recommended she continue to follow the cancer management and screening guidelines provided by her oncology and primary providers.   RECOMMENDATIONS FOR FAMILY MEMBERS:  We recommended further genetic testing in Stacy Fernandez's family as such testing might help [place] be even more confident in interpreting Stacy Fernandez's own results. We recommended Stacy Fernandez niece, who was recently diagnosed with ovarian cancer at age 52, have genetic counseling and testing. Please let us know if we can help facilitate testing. Genetic counselors can be located in other cities, by visiting the website of the [employer] ([employer]) and Field seismologist for a Dietitian by zip code.  FOLLOW-UP: Lastly, we discussed with Stacy Fernandez that cancer genetics is a rapidly advancing field and it is possible that new genetic tests will be appropriate for her and/or her family members in the future. We encouraged her to remain in contact with cancer genetics on an annual basis so we can update her personal and family histories and let her know of advances in cancer genetics that may benefit this family.   Our contact number was provided. Stacy Fernandez questions were answered to her satisfaction, and she knows she is welcome to call us at anytime with additional questions or concerns.   Catherine A. Fine,  MS, CGC Certified Genetic Counseor [email or web address]

## 2014-04-18 ENCOUNTER — Ambulatory Visit (HOSPITAL_BASED_OUTPATIENT_CLINIC_OR_DEPARTMENT_OTHER): Payer: BC Managed Care – PPO | Admitting: Oncology

## 2014-04-18 ENCOUNTER — Ambulatory Visit: Payer: BC Managed Care – PPO | Admitting: Neurology

## 2014-04-18 VITALS — BP 94/69 | HR 75 | Temp 98.1°F | Resp 18 | Wt 160.4 lb

## 2014-04-18 DIAGNOSIS — C50919 Malignant neoplasm of unspecified site of unspecified female breast: Secondary | ICD-10-CM

## 2014-04-18 DIAGNOSIS — Z171 Estrogen receptor negative status [ER-]: Secondary | ICD-10-CM

## 2014-04-18 MED ORDER — TOBRAMYCIN-DEXAMETHASONE 0.3-0.1 % OP SUSP: 1.0000 [drp] | OPHTHALMIC | Status: DC

## 2014-04-18 MED ORDER — LORAZEPAM 0.5 MG PO TABS: 0.5000 mg | ORAL_TABLET | Freq: Every evening | ORAL | Status: DC | PRN

## 2014-04-18 MED ORDER — OXYCODONE-ACETAMINOPHEN 7.5-325 MG PO TABS: 1.0000 | ORAL_TABLET | Freq: Three times a day (TID) | ORAL | Status: DC | PRN

## 2014-04-18 MED ORDER — ONDANSETRON HCL 8 MG PO TABS: 8.0000 mg | ORAL_TABLET | Freq: Two times a day (BID) | ORAL | Status: DC

## 2014-04-18 MED ORDER — LIDOCAINE-PRILOCAINE 2.5-2.5 % EX CREA
1.0000 | TOPICAL_CREAM | CUTANEOUS | Status: DC | PRN
Start: 2014-04-18 — End: 2014-06-19

## 2014-04-18 MED ORDER — PROCHLORPERAZINE MALEATE 10 MG PO TABS: 10.0000 mg | ORAL_TABLET | Freq: Four times a day (QID) | ORAL | Status: DC | PRN

## 2014-04-18 MED ORDER — DEXAMETHASONE 4 MG PO TABS: 8.0000 mg | ORAL_TABLET | Freq: Two times a day (BID) | ORAL | Status: DC

## 2014-04-18 NOTE — Progress Notes (Signed)
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 She brought it to her primary physician's attention him a and on 02/21/2014 she underwent bilateral diagnostic mammography with tomography at [facility]. The breast composition was category B. In the left breast at the 3:00 position there was an irregular mass associated with skin retraction. Ultrasound performed on the same day confirmed a 1.1 cm irregular solid mass in the left breast at the 3:00 position. There were no abnormalities by sonography in the left axilla or in the superior portion of the breast, which is for the patient experiences some tenderness.  Biopsy of the left breast area in question 02/22/2014 showed ([id number]) and invasive ductal carcinoma, grade 3, estrogen and progesterone receptor negative, with an MIB-1 of 83%, and HER-2 amplified, the signals ratio being 2.07, and a copy number per cell 4.75.  On 02/28/2014 the patient underwent bilateral breast MRI, which showed in the posterior third of the left breast at the 3:00 position an irregular spiculated mass measuring 1.8 cm. The left breast was unremarkable and there were no abnormal appearing lymph nodes.  The patient's subsequent history is as  detailed below  INTERVAL HISTORY: Stacy Fernandez returns today for followup of her breast cancer accompanied by her husband [name]. 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 Accordingly, since she was going to need a left mastectomy in any case, and given the family history of cancer, she opted for bilateral mastectomies. These were performed on 04/02/2014. The final pathology ([id number]) showed the right simple mastectomy 2 show no evidence of malignancy. There was one lymph node included in the sample which was benign.   The left mastectomy and sentinel lymph node sampling showed invasive ductal carcinoma measuring 1.5 cm. This was a grade 3 tumor. Margins were negative. A total of 3 sentinel and one non-sentinel lymph node were sample and all were clear. Accordingly the final stage was pT1c pN0 or stage IA. --The patient is here today to discuss adjuvant chemotherapy.  REVIEW OF SYSTEMS: Stacy Fernandez feels she did quite well with the surgery. There was no fever, unusual pain, or unusual bleeding. 2 of her drains were removed after the first week and the final drain was removed yesterday. She still very sore, and is taking Percocet 2 or 3 times daily. She is not constipated by this. She has met with Dr. Iran Planas and the plan will be for TRAM flap reconstruction after the completion of chemotherapy. The patient will not need radiation therapy. She has not yet resumed her walking program. She is on temporary disability and the plan is to continue that until chemotherapy has been completed. A detailed review of systems today was otherwise stable  PAST MEDICAL HISTORY:  Past Medical History  Diagnosis Date  . Hypothyroidism   . Shortness of breath     2D ECHO, 04/20/2012 - EF >55%, normal; EXERCISE STRESS TEST, 04/20/2012 - normal, EKG negative for  ischemia, no ECG changes  . COPD (chronic obstructive pulmonary disease)   . Emphysema   . Hematuria     cause unknow- saw Urologist  . Hot flashes   . Anginal pain     Dr Claiborne Billings cardiologist- test normal.  " think it is from breast cancer"  . Brain aneurysm      Dr Jannifer Franklin neurologist is monitoring  . Anxiety     Due to situation, CA breast  . Chronic bronchitis     "I've had it quite a few times" (04/02/2014)  . GERD (gastroesophageal reflux disease)   . Migraine     "treated w/botox for them" (04/02/2014)    PAST SURGICAL HISTORY: Past Surgical History  Procedure Laterality Date  . Cholecystectomy    . Appendectomy    . Laparoscopic nissen fundoplication  11/3612    for GERD- not a problem  . Video bronchoscopy Bilateral 01/16/2013    Procedure: VIDEO BRONCHOSCOPY WITHOUT FLUORO;  Surgeon: [name], MD;  Location: [facility];  Service: Cardiopulmonary;  Laterality: Bilateral;  . Mastectomy complete / simple w/ sentinel node biopsy Left 04/02/2014    axillary SLN  . Mastectomy complete / simple Right 04/02/2014    PROPHYLACTIC  . Portacath placement Right 04/02/2014  . Breast biopsy Left 11/2013 X 3  . Total abdominal hysterectomy    . Cardiac catheterization  2014  . Simple mastectomy with axillary sentinel node biopsy Bilateral 04/02/2014    Procedure: LEFT TOTAL MASTECTOMY WITH LEFT AXILLARY SENTINEL NODE BIOPSY, RIGHT PROPHYLACTIC MASTETCTOMY;  Surgeon: [name], MD;  Location: [facility];  Service: General;  Laterality: Bilateral;  . Portacath placement N/A 04/02/2014    Procedure: INSERTION PORT-A-CATH;  Surgeon: [name], MD;  Location: [facility] OR;  Service: General;  Laterality: N/A;    FAMILY HISTORY Family History  Problem Relation Age of Onset  . Heart failure Mother   . Colon cancer Father 84    stomach cancer also in 59s  . Throat cancer Brother 72    smoker  . Heart attack Maternal Grandmother   . Colon cancer Paternal Grandmother 30  . Cancer Paternal  Grandfather     kidney and bladder  . Throat cancer Brother 37    throat cancer, smoker  . Ovarian cancer Sister 44    ovarian cancer at 68, colorectal cancer at 71  . Breast cancer Paternal Aunt 51  . Ovarian cancer Other 66    niece with ovarian cancer   the patient's father died at the age of 15 from metastatic stomach cancer the patient's father's mother died from colon cancer at the age of 37. The patient's mother died at the age of 81. The patient had 2 brothers and 2 sisters. One brother died at the age of 7 from throat cancer metastatic to the lung. He was a smoker. A second brother Was diagnosed at age 87 with throat cancer metastatic to lung. He has been given 90 days to live" is not looking good". One sister died from colon cancer metastatic to bone. One sister died of a drug overdose. There is no history of breast or bearing cancer in the family to the patient's knowledge.  GYNECOLOGIC HISTORY:  No LMP recorded. Patient has had a hysterectomy. Menarche age 71, first  live birth age 26, the patient is Carnuel P4. She underwent hysterectomy with bilateral salpingo-oophorectomy approximately 1990. She took hormone replacement for approximately 2 years.  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    ADVANCED DIRECTIVES:    HEALTH MAINTENANCE: History  Substance Use Topics  . Smoking status: Former Smoker -- 1.00 packs/day for 37 years    Types: Cigarettes    Quit date: 03/29/2014  . Smokeless tobacco: Never Used     Comment: uses e-cig 01/03/13, starting using Chantix 11/13/13  . Alcohol Use: No     Colonoscopy: August 2015  PAP: May 2013  Bone density:  Lipid panel:  Allergies  Allergen Reactions  . Contrast Media [Iodinated Diagnostic Agents] Anaphylaxis    Heart stops and breathing stops    Current Outpatient Prescriptions  Medication Sig Dispense  Refill  . atorvastatin (LIPITOR) 10 MG tablet Take 10 mg by mouth daily.      . budesonide-formoterol (SYMBICORT) 160-4.5 MCG/ACT inhaler Inhale 2 puffs into the lungs 2 (two) times daily.      Marland Kitchen buPROPion (WELLBUTRIN XL) 150 MG 24 hr tablet Take 300 mg by mouth daily.       Marland Kitchen dexamethasone (DECADRON) 4 MG tablet Take 2 tablets (8 mg total) by mouth 2 (two) times daily. Start the day before Taxotere. Then again the day after chemo for 3 days.  30 tablet  1  . HYDROcodone-homatropine (HYCODAN) 5-1.5 MG/5ML syrup Take 5 mLs by mouth every 6 (six) hours as needed for cough.       Marland Kitchen ipratropium-albuterol (DUONEB) 0.5-2.5 (3) MG/3ML SOLN Take 3 mLs by nebulization every 6 (six) hours as needed.      Marland Kitchen levothyroxine (SYNTHROID, LEVOTHROID) 75 MCG tablet Take 75 mcg by mouth daily before breakfast.      . lidocaine-prilocaine (EMLA) cream Apply 1 application topically as needed. Apply over port area 1-2 hours before chemo and cover with plastic wrap  30 g  0  . LORazepam (ATIVAN) 0.5 MG tablet Take 1 tablet (0.5 mg total) by mouth at bedtime as needed (Nausea or vomiting).  30 tablet  0  . montelukast (SINGULAIR) 10 MG tablet Take 10 mg by mouth at bedtime.       . nitroGLYCERIN (NITROSTAT) 0.4 MG SL tablet Place 0.4 mg under the tongue every 5 (five) minutes as needed for chest pain.      Marland Kitchen ondansetron (ZOFRAN) 8 MG tablet Take 1 tablet (8 mg total) by mouth 2 (two) times daily. Start the day after chemo for 3 days. Then take as needed for nausea or vomiting.  30 tablet  1  . oxyCODONE-acetaminophen (PERCOCET) 7.5-325 MG per tablet Take 1 tablet by mouth every 8 (eight) hours as needed for pain.  30 tablet  0  . PROAIR HFA 108 (90 BASE) MCG/ACT inhaler Inhale 2 puffs into the lungs every 4 (four) hours as needed for wheezing or shortness of breath.       . prochlorperazine (COMPAZINE) 10 MG tablet Take 1 tablet (10 mg total) by mouth every 6 (six) hours as needed (Nausea or vomiting).  30 tablet  1  .  rizatriptan (MAXALT-MLT) 10 MG disintegrating tablet Take 1 tablet (10 mg total) by mouth as needed for migraine. May repeat in 2 hours if needed  15 tablet  11  . SUMAtriptan (IMITREX) 6 MG/0.5ML SOLN injection  Inject 6 mg into the skin every 2 (two) hours as needed for migraine or headache. May repeat in 2 hours if headache persists or recurs. Max 2 doses in 24 hours      . tiZANidine (ZANAFLEX) 2 MG tablet Take 1 tablet (2 mg total) by mouth 2 (two) times daily as needed for muscle spasms.  60 tablet  11  . tobramycin-dexamethasone (TOBRADEX) ophthalmic solution Place 1 drop into both eyes every 4 (four) hours while awake.  5 mL  0  . traZODone (DESYREL) 50 MG tablet Take 1 tablet (50 mg total) by mouth at bedtime.  30 tablet  4  . verapamil (CALAN-SR) 120 MG CR tablet Take 1 tablet (120 mg total) by mouth daily.  90 tablet  3   Current Facility-Administered Medications  Medication Dose Route Frequency Provider Last Rate Last Dose  . botulinum toxin Type A (BOTOX) injection 150 Units  150 Units Intramuscular Once Marcial Pacas, MD        OBJECTIVE: Middle-aged white woman who appears older than stated age There were no vitals filed for this visit.   There is no weight on file to calculate BMI.    ECOG FS:1 - Symptomatic but completely ambulatory Vitals - 1 value per visit 01/21/8888  SYSTOLIC 169  DIASTOLIC 63  Pulse 88  Temperature 97.7  Respirations 17  Weight (lb) 154  Height _0   BMI   VISIT REPORT    Sclerae unicteric, pupils equal and reactive Oropharynx clear and moist No cervical or supraclavicular adenopathy Lungs no rales or rhonchi Heart regular rate and rhythm Abd soft, nontender, positive bowel sounds MSK no focal spinal tenderness, no upper extremity lymphedema Neuro: nonfocal, well oriented, appropriate affect Breasts: Status post bilateral mastectomies. The incisions are healing very nicely. There is no significant dehiscence, erythema, or unusual swelling. Both  axillae are benign.  LAB RESULTS:  CMP     Component Value Date/Time   NA 142 04/03/2014 0420   NA 141 03/07/2014 0813   K 4.1 04/03/2014 0420   K 3.9 03/07/2014 0813   CL 106 04/03/2014 0420   CO2 19 04/03/2014 0420   CO2 22 03/07/2014 0813   GLUCOSE 162* 04/03/2014 0420   GLUCOSE 96 03/07/2014 0813   BUN 8 04/03/2014 0420   BUN 15.1 03/07/2014 0813   CREATININE 0.71 04/03/2014 0420   CREATININE 1.0 03/07/2014 0813   CALCIUM 8.6 04/03/2014 0420   CALCIUM 9.3 03/07/2014 0813   PROT 6.5 04/02/2014 0804   PROT 7.3 03/07/2014 0813   ALBUMIN 3.4* 04/02/2014 0804   ALBUMIN 3.9 03/07/2014 0813   AST 20 04/02/2014 0804   AST 15 03/07/2014 0813   ALT 21 04/02/2014 0804   ALT 13 03/07/2014 0813   ALKPHOS 112 04/02/2014 0804   ALKPHOS 108 03/07/2014 0813   BILITOT 0.3 04/02/2014 0804   BILITOT 0.24 03/07/2014 0813   GFRNONAA >90 04/03/2014 0420   GFRAA >90 04/03/2014 0420    I No results found for this basename: SPEP,  UPEP,   kappa and lambda light chains    Lab Results  Component Value Date   WBC 7.2 04/03/2014   NEUTROABS 2.4 04/02/2014   HGB 10.1* 04/03/2014   HCT 29.9* 04/03/2014   MCV 92.0 04/03/2014   PLT 194 04/03/2014      Chemistry      Component Value Date/Time   NA 142 04/03/2014 0420   NA 141 03/07/2014 0813   K 4.1 04/03/2014 0420  K 3.9 03/07/2014 0813   CL 106 04/03/2014 0420   CO2 19 04/03/2014 0420   CO2 22 03/07/2014 0813   BUN 8 04/03/2014 0420   BUN 15.1 03/07/2014 0813   CREATININE 0.71 04/03/2014 0420   CREATININE 1.0 03/07/2014 0813      Component Value Date/Time   CALCIUM 8.6 04/03/2014 0420   CALCIUM 9.3 03/07/2014 0813   ALKPHOS 112 04/02/2014 0804   ALKPHOS 108 03/07/2014 0813   AST 20 04/02/2014 0804   AST 15 03/07/2014 0813   ALT 21 04/02/2014 0804   ALT 13 03/07/2014 0813   BILITOT 0.3 04/02/2014 0804   BILITOT 0.24 03/07/2014 0813       No results found for this basename: LABCA2    No components found with this basename: LABCA125    No results found for this  basename: INR,  in the last 168 hours  Urinalysis    Component Value Date/Time   COLORURINE YELLOW 12/25/2012 2225   APPEARANCEUR CLOUDY* 12/25/2012 2225   LABSPEC 1.027 12/25/2012 2225   PHURINE 5.0 12/25/2012 2225   GLUCOSEU NEGATIVE 12/25/2012 2225   HGBUR LARGE* 12/25/2012 [place] 12/25/2012 [place] 12/25/2012 [place] 12/25/2012 2225   UROBILINOGEN 0.2 12/25/2012 2225   NITRITE NEGATIVE 12/25/2012 2225   LEUKOCYTESUR TRACE* 12/25/2012 2225    STUDIES: Chest 2 View  04/02/2014   CLINICAL DATA:  Preoperative exam for Port-A-Cath placement. Planned left total mastectomy for breast cancer.  EXAM: CHEST  2 VIEW  COMPARISON:  PET-CT 03/12/2014, chest radiograph 02/01/2013  FINDINGS: The heart size and mediastinal contours are within normal limits. Both lungs are clear. The visualized skeletal structures are unremarkable. Mild hyperinflation is reidentified suggesting emphysema. Upper abdominal clips are noted.  IMPRESSION: No active cardiopulmonary disease. Mild hyperinflation again suggesting emphysema.   Electronically Signed   By: Conchita Paris M.D.   On: 04/02/2014 08:18   Nm Sentinel Node Inj-no Rpt (breast)  04/02/2014   CLINICAL DATA: Cancer left breast   Sulfur colloid was injected intradermally by the nuclear medicine  technologist for breast cancer sentinel node localization.    Dg Chest Portable 1 View  04/02/2014   CLINICAL DATA:  Status post port catheter insertion  EXAM: PORTABLE CHEST - 1 VIEW  COMPARISON:  April 02, 2014  FINDINGS: The heart size and mediastinal contours are stable. Both lungs are clear. Lungs are hyperinflated. There is mild atelectasis of right lung base. Right porta catheter is identified with distal tip in the superior vena cava. There is no pneumothorax. The visualized skeletal structures are stable. Surgical clips are identified within bilateral wall.  IMPRESSION: Right porta catheter with distal tip in the superior  vena cava. There is no pneumothorax. Slight hyperinflated lungs.   Electronically Signed   By: Abelardo Diesel M.D.   On: 04/02/2014 14:42   Dg Fluoro Guide Cv Line-no Report  04/02/2014   CLINICAL DATA: PORT-A-CATH FOR LEFT BREAST CANCER   FLOURO GUIDE CV LINE  Fluoroscopy was utilized by the requesting physician.  No radiographic  interpretation.       ASSESSMENT: 53 y.o. BRCA negative [place] woman status post left breast biopsy 02/22/2014 for a clinical T2/T3 NX, stage II or 3 invasive ductal carcinoma, grade 3, estrogen and progesterone receptor negative, with an MIB-1 of 83%, and HER-2 amplified with a signals a ratio of 2.07and a copy number per cell of 4.75  (1) biopsy of an additional area  of enhancement in the left breast 03/08/2014 showed ductal carcinoma in situ, estrogen and progesterone receptor negative.  (2) status post bilateral mastectomies with left sentinel lymph node sampling 04/02/2014, showing:  (a) on the right, benign breast tissue including a single negative lymph node  (b) On  the left, a  pT1c pN0, stage IA invasive ductal carcinoma, grade 3, with negative margins  (3) adjuvant chemotherapy to consist of carboplatin and docetaxel given every 3 weeks x6, together with trastuzumab   (4) trastuzumab to be continued to complete a year. Most recent echocardiogram 03/21/2014 showed a normal ejection fraction  (5) reconstruction to follow after completion of chemotherapy  PLAN: Today we spent approximately one hour reviewing [name]'s pathology and genetic data. She understands there is no evidence of a BRCA mutation. Her tumor was small. However it was aggressive. She already has a port in her echocardiogram earlier this month was very favorable.  We're going to proceed to carboplatin and docetaxel even every 21 days x6 together with trastuzumab. We discussed the possible toxicities, side effects and complications of this combination. In particular she was alerted to  possible heart problems from the trastuzumab and possible neuropathy problems from the docetaxel.  We're going to start chemotherapy on October 20. This will work well as far as thanksgivings is concerned, but does put 8 treatments on December 22. We may want to postpone.1 for a week, but we will make that decision as we come closer to it. She will receive the trastuzumab every 3 weeks instead of weekly for convenience, since she has to drive all the way from [place].  I gave her a "roadmap" of how to take her medication and put in all her prescriptions for her. She also requested additional Percocet since she is having still significant pain from her recent surgery. I gave her a 30 additional tablets with no refills.  She will return to see [place] shortly before her first treatment, to review all we went over today and make sure that she has appropriate appointments for all her therapies.  Stacy Fernandez has a good understanding of the overall plan. She agrees with it. She knows to goal of treatment in her case is cure. She will call with any problems that may develop before her next visit here.  [name], MD   04/18/2014 6:13 PM

## 2014-04-19 ENCOUNTER — Telehealth: Payer: Self-pay | Admitting: Oncology

## 2014-04-19 NOTE — Telephone Encounter (Signed)
s.w. pt and advised on OCT appts....pt ok and aware....pt will pick up new sched at visit

## 2014-05-07 ENCOUNTER — Encounter: Payer: Self-pay | Admitting: Nurse Practitioner

## 2014-05-07 ENCOUNTER — Other Ambulatory Visit (HOSPITAL_BASED_OUTPATIENT_CLINIC_OR_DEPARTMENT_OTHER): Payer: BC Managed Care – PPO

## 2014-05-07 ENCOUNTER — Other Ambulatory Visit: Payer: Self-pay | Admitting: Oncology

## 2014-05-07 ENCOUNTER — Ambulatory Visit (HOSPITAL_BASED_OUTPATIENT_CLINIC_OR_DEPARTMENT_OTHER): Payer: BC Managed Care – PPO | Admitting: Nurse Practitioner

## 2014-05-07 VITALS — BP 99/52 | HR 85 | Temp 98.4°F | Resp 18 | Ht 66.0 in | Wt 166.1 lb

## 2014-05-07 DIAGNOSIS — C50412 Malignant neoplasm of upper-outer quadrant of left female breast: Secondary | ICD-10-CM

## 2014-05-07 DIAGNOSIS — C50812 Malignant neoplasm of overlapping sites of left female breast: Secondary | ICD-10-CM

## 2014-05-07 DIAGNOSIS — G8918 Other acute postprocedural pain: Secondary | ICD-10-CM | POA: Insufficient documentation

## 2014-05-07 DIAGNOSIS — C50419 Malignant neoplasm of upper-outer quadrant of unspecified female breast: Secondary | ICD-10-CM

## 2014-05-07 LAB — COMPREHENSIVE METABOLIC PANEL (CC13)
ALK PHOS: 104 U/L (ref 40–150)
ALT: 15 U/L (ref 0–55)
AST: 16 U/L (ref 5–34)
Albumin: 3.7 g/dL (ref 3.5–5.0)
Anion Gap: 7 mEq/L (ref 3–11)
BILIRUBIN TOTAL: 0.34 mg/dL (ref 0.20–1.20)
BUN: 17.4 mg/dL (ref 7.0–26.0)
CO2: 23 mEq/L (ref 22–29)
CREATININE: 1.1 mg/dL (ref 0.6–1.1)
Calcium: 9.3 mg/dL (ref 8.4–10.4)
Chloride: 110 mEq/L — ABNORMAL HIGH (ref 98–109)
Glucose: 121 mg/dl (ref 70–140)
Potassium: 4.5 mEq/L (ref 3.5–5.1)
Sodium: 140 mEq/L (ref 136–145)
Total Protein: 6.7 g/dL (ref 6.4–8.3)

## 2014-05-07 LAB — CBC WITH DIFFERENTIAL/PLATELET
BASO%: 0.5 % (ref 0.0–2.0)
Basophils Absolute: 0 10*3/uL (ref 0.0–0.1)
EOS ABS: 0 10*3/uL (ref 0.0–0.5)
EOS%: 0.3 % (ref 0.0–7.0)
HEMATOCRIT: 33.8 % — AB (ref 34.8–46.6)
HEMOGLOBIN: 11.2 g/dL — AB (ref 11.6–15.9)
LYMPH%: 11.9 % — AB (ref 14.0–49.7)
MCH: 30.5 pg (ref 25.1–34.0)
MCHC: 33.3 g/dL (ref 31.5–36.0)
MCV: 91.6 fL (ref 79.5–101.0)
MONO#: 0.1 10*3/uL (ref 0.1–0.9)
MONO%: 2.1 % (ref 0.0–14.0)
NEUT%: 85.2 % — AB (ref 38.4–76.8)
NEUTROS ABS: 4.2 10*3/uL (ref 1.5–6.5)
PLATELETS: 192 10*3/uL (ref 145–400)
RBC: 3.69 10*6/uL — ABNORMAL LOW (ref 3.70–5.45)
RDW: 13.1 % (ref 11.2–14.5)
WBC: 5 10*3/uL (ref 3.9–10.3)
lymph#: 0.6 10*3/uL — ABNORMAL LOW (ref 0.9–3.3)

## 2014-05-07 MED ORDER — OXYCODONE-ACETAMINOPHEN 7.5-325 MG PO TABS: 1.0000 | ORAL_TABLET | Freq: Three times a day (TID) | ORAL | Status: DC | PRN

## 2014-05-07 NOTE — Progress Notes (Signed)
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 She brought it to her primary physician's attention him a and on 02/21/2014 she underwent bilateral diagnostic mammography with tomography at [facility]. The breast composition was category B. In the left breast at the 3:00 position there was an irregular mass associated with skin retraction. Ultrasound performed on the same day confirmed a 1.1 cm irregular solid mass in the left breast at the 3:00 position. There were no abnormalities by sonography in the left axilla or in the superior portion of the breast, which is for the patient experiences some tenderness.  Biopsy of the left breast area in question 02/22/2014 showed ([id number]) and invasive ductal carcinoma, grade 3, estrogen and progesterone receptor negative, with an MIB-1 of 83%, and HER-2 amplified, the signals ratio being 2.07, and a copy number per cell 4.75.  On 02/28/2014 the patient underwent bilateral breast MRI, which showed in the posterior third of the left breast at the 3:00 position an irregular spiculated mass measuring 1.8 cm. The left breast was unremarkable and there were no abnormal appearing lymph nodes.  The patient's subsequent history is as  detailed below  INTERVAL HISTORY: Stacy Fernandez returns today for follow up of her breast cancer accompanied by her husband [name]. She is to begin carboplatin and docetaxel every 21 days x 6 along with trastuzumab given on the same schedule. She spoke with Dr. Jana Hakim a few weeks ago about the toxicities, side effects, and complications of these drugs. They also reviewed the anti-emetic "road map" and she has picked up these prescriptions up from her pharmacy.   REVIEW OF SYSTEMS: Stacy Fernandez denies fevers, chills, nausea, vomiting, or changes in bowel or bladder habits. At this time she is most concerned about her residual surgical pain. She is using less percocet than previously documented, maybe 1 tablet per day. However, she wants to know how long this soreness will last. She does reaching and stretching exercises daily to regain her strength in her upper extremities and chest. She is on temporary disability, and feels bored at home. She is tearful during this portion of the visit and endorses depression, but denies suicidal or homicidal ideation. A detailed review of systems is otherwise noncontributory.   PAST MEDICAL HISTORY: Past Medical History  Diagnosis Date  . Hypothyroidism   . Shortness of breath     2D ECHO, 04/20/2012 - EF >55%, normal; EXERCISE STRESS TEST, 04/20/2012 - normal, EKG negative for ischemia, no ECG changes  . COPD (chronic obstructive pulmonary disease)   . Emphysema   . Hematuria     cause unknow- saw Urologist  . Hot flashes   . Anginal pain     Dr Claiborne Billings cardiologist- test normal.  " think it is from breast cancer"  . Brain aneurysm      Dr Jannifer Franklin neurologist is monitoring  .  Anxiety     Due to situation, CA breast  . Chronic bronchitis     "I've had it quite a few times" (04/02/2014)  . GERD (gastroesophageal reflux disease)   . Migraine     "treated w/botox for them" (04/02/2014)    PAST SURGICAL HISTORY: Past Surgical History  Procedure Laterality Date  .  Cholecystectomy    . Appendectomy    . Laparoscopic nissen fundoplication  0/3491    for GERD- not a problem  . Video bronchoscopy Bilateral 01/16/2013    Procedure: VIDEO BRONCHOSCOPY WITHOUT FLUORO;  Surgeon: [name], MD;  Location: [facility];  Service: Cardiopulmonary;  Laterality: Bilateral;  . Mastectomy complete / simple w/ sentinel node biopsy Left 04/02/2014    axillary SLN  . Mastectomy complete / simple Right 04/02/2014    PROPHYLACTIC  . Portacath placement Right 04/02/2014  . Breast biopsy Left 11/2013 X 3  . Total abdominal hysterectomy    . Cardiac catheterization  2014  . Simple mastectomy with axillary sentinel node biopsy Bilateral 04/02/2014    Procedure: LEFT TOTAL MASTECTOMY WITH LEFT AXILLARY SENTINEL NODE BIOPSY, RIGHT PROPHYLACTIC MASTETCTOMY;  Surgeon: [name], MD;  Location: [facility];  Service: General;  Laterality: Bilateral;  . Portacath placement N/A 04/02/2014    Procedure: INSERTION PORT-A-CATH;  Surgeon: [name], MD;  Location: [facility] OR;  Service: General;  Laterality: N/A;    FAMILY HISTORY Family History  Problem Relation Age of Onset  . Heart failure Mother   . Colon cancer Father 55    stomach cancer also in 26s  . Throat cancer Brother 53    smoker  . Heart attack Maternal Grandmother   . Colon cancer Paternal Grandmother 56  . Cancer Paternal Grandfather     kidney and bladder  . Throat cancer Brother 37    throat cancer, smoker  . Ovarian cancer Sister 25    ovarian cancer at 72, colorectal cancer at 23  . Breast cancer Paternal Aunt 54  . Ovarian cancer Other 21    niece with ovarian cancer   the patient's father died at the age of 21 from metastatic stomach cancer the patient's father's mother died from colon cancer at the age of 51. The patient's mother died at the age of 75. The patient had 2 brothers and 2 sisters. One brother died at the age of 21 from throat cancer metastatic to the lung. He was a smoker. A second brother  Was diagnosed at age 89 with throat cancer metastatic to lung. He has been given 90 days to live" is not looking good". One sister died from colon cancer metastatic to bone. One sister died of a drug overdose. There is no history of breast or bearing cancer in the family to the patient's knowledge.  GYNECOLOGIC HISTORY:  No LMP recorded. Patient has had a hysterectomy. Menarche age 8, first live birth age 89, the patient is [place] P4. She underwent hysterectomy with bilateral salpingo-oophorectomy approximately 1990. She took hormone replacement for approximately 2 years.  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    ADVANCED DIRECTIVES:    HEALTH MAINTENANCE: History  Substance Use Topics  . Smoking status: Former Smoker -- 1.00 packs/day for 37 years    Types: Cigarettes    Quit date: 03/29/2014  . Smokeless tobacco: Never Used     Comment: uses  e-cig 01/03/13, starting using Chantix 11/13/13  . Alcohol Use: No     Colonoscopy: August 2015  PAP: May 2013  Bone density:  Lipid panel:  Allergies  Allergen Reactions  . Contrast Media [Iodinated Diagnostic Agents] Anaphylaxis    Heart stops and breathing stops    Current Outpatient Prescriptions  Medication Sig Dispense Refill  . atorvastatin (LIPITOR) 10 MG tablet Take 10 mg by mouth daily.      Marland Kitchen buPROPion (WELLBUTRIN XL) 150 MG 24 hr tablet Take 300 mg by mouth daily.       Marland Kitchen dexamethasone (DECADRON) 4 MG tablet Take 2 tablets (8 mg total) by mouth 2 (two) times daily. Start the day before Taxotere. Then again the day after chemo for 3 days.  30 tablet  1  . ipratropium-albuterol (DUONEB) 0.5-2.5 (3) MG/3ML SOLN Take 3 mLs by nebulization every 6 (six) hours as needed.      Marland Kitchen levothyroxine (SYNTHROID, LEVOTHROID) 75 MCG tablet Take 75 mcg by mouth daily before breakfast.      . lidocaine-prilocaine (EMLA)  cream Apply 1 application topically as needed. Apply over port area 1-2 hours before chemo and cover with plastic wrap  30 g  0  . montelukast (SINGULAIR) 10 MG tablet Take 10 mg by mouth at bedtime.       Marland Kitchen oxyCODONE-acetaminophen (PERCOCET) 7.5-325 MG per tablet Take 1 tablet by mouth every 8 (eight) hours as needed for pain.  30 tablet  0  . rizatriptan (MAXALT-MLT) 10 MG disintegrating tablet Take 1 tablet (10 mg total) by mouth as needed for migraine. May repeat in 2 hours if needed  15 tablet  11  . tiZANidine (ZANAFLEX) 2 MG tablet Take 1 tablet (2 mg total) by mouth 2 (two) times daily as needed for muscle spasms.  60 tablet  11  . traZODone (DESYREL) 50 MG tablet Take 1 tablet (50 mg total) by mouth at bedtime.  30 tablet  4  . verapamil (CALAN-SR) 120 MG CR tablet Take 1 tablet (120 mg total) by mouth daily.  90 tablet  3  . budesonide-formoterol (SYMBICORT) 160-4.5 MCG/ACT inhaler Inhale 2 puffs into the lungs 2 (two) times daily.      Marland Kitchen HYDROcodone-homatropine (HYCODAN) 5-1.5 MG/5ML syrup Take 5 mLs by mouth every 6 (six) hours as needed for cough.       Marland Kitchen LORazepam (ATIVAN) 0.5 MG tablet Take 1 tablet (0.5 mg total) by mouth at bedtime as needed (Nausea or vomiting).  30 tablet  0  . nitroGLYCERIN (NITROSTAT) 0.4 MG SL tablet Place 0.4 mg under the tongue every 5 (five) minutes as needed for chest pain.      Marland Kitchen ondansetron (ZOFRAN) 8 MG tablet Take 1 tablet (8 mg total) by mouth 2 (two) times daily. Start the day after chemo for 3 days. Then take as needed for nausea or vomiting.  30 tablet  1  . PROAIR HFA 108 (90 BASE) MCG/ACT inhaler Inhale 2 puffs into the lungs every 4 (four) hours as needed for wheezing or shortness of breath.       . prochlorperazine (COMPAZINE) 10 MG tablet Take 1 tablet (10 mg total) by mouth every 6 (six) hours as needed (Nausea or vomiting).  30 tablet  1  . SUMAtriptan (IMITREX) 6 MG/0.5ML SOLN injection Inject 6 mg into the skin every 2 (two) hours as needed  for migraine or headache. May repeat in 2 hours if headache persists or recurs. Max 2 doses in 24  hours      . tobramycin-dexamethasone (TOBRADEX) ophthalmic solution Place 1 drop into both eyes every 4 (four) hours while awake.  5 mL  0   Current Facility-Administered Medications  Medication Dose Route Frequency Provider Last Rate Last Dose  . botulinum toxin Type A (BOTOX) injection 150 Units  150 Units Intramuscular Once Marcial Pacas, MD        OBJECTIVE: Middle-aged white woman who appears older than stated age 55 Vitals:   05/07/14 1504  BP: 99/52  Pulse: 85  Temp: 98.4 F (36.9 C)  Resp: 18     Body mass index is 26.82 kg/(m^2).    ECOG FS:1 - Symptomatic but completely ambulatory Vitals - 1 value per visit 01/18/6377  SYSTOLIC 588  DIASTOLIC 63  Pulse 88  Temperature 97.7  Respirations 17  Weight (lb) 154  Height '5\' 6"'   BMI   VISIT REPORT    Skin: warm, dry  HEENT: sclerae anicteric, conjunctivae pink, oropharynx clear. No thrush or mucositis.  Lymph Nodes: No cervical or supraclavicular lymphadenopathy  Lungs: clear to auscultation bilaterally, no rales, wheezes, or rhonci  Heart: regular rate and rhythm  Abdomen: round, soft, non tender, positive bowel sounds  Musculoskeletal: No focal spinal tenderness, no peripheral edema  Neuro: non focal, well oriented, positive affect  Breasts: status post bilateral mastectomies 5 weeks ago. Incisions continue to heal well, clean dry and intact, well approximated. Bilateral axillae benign.   LAB RESULTS:  CMP     Component Value Date/Time   NA 140 05/07/2014 1453   NA 142 04/03/2014 0420   K 4.5 05/07/2014 1453   K 4.1 04/03/2014 0420   CL 106 04/03/2014 0420   CO2 23 05/07/2014 1453   CO2 19 04/03/2014 0420   GLUCOSE 121 05/07/2014 1453   GLUCOSE 162* 04/03/2014 0420   BUN 17.4 05/07/2014 1453   BUN 8 04/03/2014 0420   CREATININE 1.1 05/07/2014 1453   CREATININE 0.71 04/03/2014 0420   CALCIUM 9.3 05/07/2014 1453    CALCIUM 8.6 04/03/2014 0420   PROT 6.7 05/07/2014 1453   PROT 6.5 04/02/2014 0804   ALBUMIN 3.7 05/07/2014 1453   ALBUMIN 3.4* 04/02/2014 0804   AST 16 05/07/2014 1453   AST 20 04/02/2014 0804   ALT 15 05/07/2014 1453   ALT 21 04/02/2014 0804   ALKPHOS 104 05/07/2014 1453   ALKPHOS 112 04/02/2014 0804   BILITOT 0.34 05/07/2014 1453   BILITOT 0.3 04/02/2014 0804   GFRNONAA >90 04/03/2014 0420   GFRAA >90 04/03/2014 0420    I No results found for this basename: SPEP,  UPEP,   kappa and lambda light chains    Lab Results  Component Value Date   WBC 5.0 05/07/2014   NEUTROABS 4.2 05/07/2014   HGB 11.2* 05/07/2014   HCT 33.8* 05/07/2014   MCV 91.6 05/07/2014   PLT 192 05/07/2014      Chemistry      Component Value Date/Time   NA 140 05/07/2014 1453   NA 142 04/03/2014 0420   K 4.5 05/07/2014 1453   K 4.1 04/03/2014 0420   CL 106 04/03/2014 0420   CO2 23 05/07/2014 1453   CO2 19 04/03/2014 0420   BUN 17.4 05/07/2014 1453   BUN 8 04/03/2014 0420   CREATININE 1.1 05/07/2014 1453   CREATININE 0.71 04/03/2014 0420      Component Value Date/Time   CALCIUM 9.3 05/07/2014 1453   CALCIUM 8.6 04/03/2014 0420   ALKPHOS 104 05/07/2014 1453  ALKPHOS 112 04/02/2014 0804   AST 16 05/07/2014 1453   AST 20 04/02/2014 0804   ALT 15 05/07/2014 1453   ALT 21 04/02/2014 0804   BILITOT 0.34 05/07/2014 1453   BILITOT 0.3 04/02/2014 0804       No results found for this basename: LABCA2    No components found with this basename: LABCA125    No results found for this basename: INR,  in the last 168 hours  Urinalysis    Component Value Date/Time   COLORURINE YELLOW 12/25/2012 2225   APPEARANCEUR CLOUDY* 12/25/2012 2225   LABSPEC 1.027 12/25/2012 2225   PHURINE 5.0 12/25/2012 2225   GLUCOSEU NEGATIVE 12/25/2012 2225   HGBUR LARGE* 12/25/2012 [place] 12/25/2012 [place] 12/25/2012 2225   PROTEINUR NEGATIVE 12/25/2012 2225   UROBILINOGEN 0.2 12/25/2012 2225   NITRITE  NEGATIVE 12/25/2012 2225   LEUKOCYTESUR TRACE* 12/25/2012 2225    STUDIES: Most recent echocardiogram on 03/21/14 showed an ejection fraction of 66%  ASSESSMENT: 53 y.o. BRCA negative [place] woman status post left breast biopsy 02/22/2014 for a clinical T2/T3 NX, stage II or 3 invasive ductal carcinoma, grade 3, estrogen and progesterone receptor negative, with an MIB-1 of 83%, and HER-2 amplified with a signals a ratio of 2.07and a copy number per cell of 4.75  (1) biopsy of an additional area of enhancement in the left breast 03/08/2014 showed ductal carcinoma in situ, estrogen and progesterone receptor negative.  (2) status post bilateral mastectomies with left sentinel lymph node sampling 04/02/2014, showing:  (a) on the right, benign breast tissue including a single negative lymph node  (b) On  the left, a  pT1c pN0, stage IA invasive ductal carcinoma, grade 3, with negative margins  (3) adjuvant chemotherapy to consist of carboplatin and docetaxel given every 3 weeks x6, together with trastuzumab   (4) trastuzumab to be continued to complete a year. Most recent echocardiogram 03/21/2014 showed a normal ejection fraction  (5) reconstruction to follow after completion of chemotherapy  PLAN: Stacy Fernandez and I spent about 30 minutes reviewing what to expect as far as her first chemo treatment tomorrow. I am confident that she will be able to manage side effects such as nausea, constipation, diarrhea, or fatigue well at home. She is well acquainted with the use of her anti-emetic drugs.   As far as her pain, she would like to get off of the percocet, but still demonstrates a need for them for breakthrough pain. I have refilled this prescription with 30 pills only, no refills. She will work towards using OTC analgesics for the pain instead. She may be a candidate for physical therapy in the near future, as her left arm is still weak during some movements.   Stacy Fernandez will proceed with day 1 cycle 1 of  carboplatin and docetaxel along with herceptin tomorrow. She understands and agrees with this plan. She knows the goal of treatment in her case is cure. She has been encouraged to call with any issues that might arise before her next visit here.  Marcelino Duster, NP   05/07/2014 5:01 PM

## 2014-05-08 ENCOUNTER — Ambulatory Visit (HOSPITAL_BASED_OUTPATIENT_CLINIC_OR_DEPARTMENT_OTHER): Payer: BC Managed Care – PPO

## 2014-05-08 VITALS — BP 125/70 | HR 86 | Temp 98.3°F | Resp 18

## 2014-05-08 DIAGNOSIS — C50412 Malignant neoplasm of upper-outer quadrant of left female breast: Secondary | ICD-10-CM

## 2014-05-08 DIAGNOSIS — Z5112 Encounter for antineoplastic immunotherapy: Secondary | ICD-10-CM

## 2014-05-08 DIAGNOSIS — C50812 Malignant neoplasm of overlapping sites of left female breast: Secondary | ICD-10-CM

## 2014-05-08 DIAGNOSIS — C50419 Malignant neoplasm of upper-outer quadrant of unspecified female breast: Secondary | ICD-10-CM

## 2014-05-08 DIAGNOSIS — Z5111 Encounter for antineoplastic chemotherapy: Secondary | ICD-10-CM

## 2014-05-08 MED ORDER — DIPHENHYDRAMINE HCL 25 MG PO CAPS
25.0000 mg | ORAL_CAPSULE | Freq: Once | ORAL | Status: AC
  Administered 2014-05-08: 25 mg via ORAL

## 2014-05-08 MED ORDER — TRASTUZUMAB CHEMO INJECTION 440 MG
8.0000 mg/kg | Freq: Once | INTRAVENOUS | Status: AC
  Administered 2014-05-08: 609 mg via INTRAVENOUS
  Filled 2014-05-08: qty 29

## 2014-05-08 MED ORDER — SODIUM CHLORIDE 0.9 % IV SOLN
480.0000 mg | Freq: Once | INTRAVENOUS | Status: AC
  Administered 2014-05-08: 480 mg via INTRAVENOUS
  Filled 2014-05-08: qty 48

## 2014-05-08 MED ORDER — HEPARIN SOD (PORK) LOCK FLUSH 100 UNIT/ML IV SOLN
500.0000 [IU] | Freq: Once | INTRAVENOUS | Status: AC | PRN
  Administered 2014-05-08: 500 [IU]
  Filled 2014-05-08: qty 5

## 2014-05-08 MED ORDER — SODIUM CHLORIDE 0.9 % IV SOLN
Freq: Once | INTRAVENOUS | Status: AC
  Administered 2014-05-08: 09:00:00 via INTRAVENOUS

## 2014-05-08 MED ORDER — DOCETAXEL CHEMO INJECTION 160 MG/16ML
75.0000 mg/m2 | Freq: Once | INTRAVENOUS | Status: AC
  Administered 2014-05-08: 140 mg via INTRAVENOUS
  Filled 2014-05-08: qty 14

## 2014-05-08 MED ORDER — ACETAMINOPHEN 325 MG PO TABS
ORAL_TABLET | ORAL | Status: AC
  Filled 2014-05-08: qty 2

## 2014-05-08 MED ORDER — DIPHENHYDRAMINE HCL 25 MG PO CAPS
ORAL_CAPSULE | ORAL | Status: AC
  Filled 2014-05-08: qty 1

## 2014-05-08 MED ORDER — ONDANSETRON 16 MG/50ML IVPB (CHCC)
16.0000 mg | Freq: Once | INTRAVENOUS | Status: AC
  Administered 2014-05-08: 16 mg via INTRAVENOUS

## 2014-05-08 MED ORDER — DEXAMETHASONE SODIUM PHOSPHATE 20 MG/5ML IJ SOLN
20.0000 mg | Freq: Once | INTRAMUSCULAR | Status: AC
  Administered 2014-05-08: 20 mg via INTRAVENOUS

## 2014-05-08 MED ORDER — DEXAMETHASONE SODIUM PHOSPHATE 20 MG/5ML IJ SOLN
INTRAMUSCULAR | Status: AC
  Filled 2014-05-08: qty 5

## 2014-05-08 MED ORDER — SODIUM CHLORIDE 0.9 % IJ SOLN
10.0000 mL | INTRAMUSCULAR | Status: DC | PRN
  Administered 2014-05-08: 10 mL
  Filled 2014-05-08: qty 10

## 2014-05-08 MED ORDER — ONDANSETRON 16 MG/50ML IVPB (CHCC)
INTRAVENOUS | Status: AC
  Filled 2014-05-08: qty 16

## 2014-05-08 MED ORDER — ACETAMINOPHEN 325 MG PO TABS
650.0000 mg | ORAL_TABLET | Freq: Once | ORAL | Status: AC
  Administered 2014-05-08: 650 mg via ORAL

## 2014-05-08 NOTE — Patient Instructions (Signed)
Stacy Fernandez Discharge Instructions for Patients Receiving Chemotherapy  Today you received the following chemotherapy agents: Herceptin, Taxotere, Carboplatin.  To help prevent nausea and vomiting after your treatment, we encourage you to take your nausea medication as prescribed.   If you develop nausea and vomiting that is not controlled by your nausea medication, call the clinic.   BELOW ARE SYMPTOMS THAT SHOULD BE REPORTED IMMEDIATELY:  *FEVER GREATER THAN 100.5 F  *CHILLS WITH OR WITHOUT FEVER  NAUSEA AND VOMITING THAT IS NOT CONTROLLED WITH YOUR NAUSEA MEDICATION  *UNUSUAL SHORTNESS OF BREATH  *UNUSUAL BRUISING OR BLEEDING  TENDERNESS IN MOUTH AND THROAT WITH OR WITHOUT PRESENCE OF ULCERS  *URINARY PROBLEMS  *BOWEL PROBLEMS  UNUSUAL RASH Items with * indicate a potential emergency and should be followed up as soon as possible.  Feel free to call the clinic you have any questions or concerns. The clinic phone number is [phone].

## 2014-05-09 ENCOUNTER — Telehealth: Payer: Self-pay | Admitting: *Deleted

## 2014-05-09 ENCOUNTER — Ambulatory Visit (HOSPITAL_BASED_OUTPATIENT_CLINIC_OR_DEPARTMENT_OTHER): Payer: BC Managed Care – PPO

## 2014-05-09 ENCOUNTER — Telehealth: Payer: Self-pay

## 2014-05-09 VITALS — BP 107/62 | HR 79 | Temp 97.4°F

## 2014-05-09 DIAGNOSIS — C50419 Malignant neoplasm of upper-outer quadrant of unspecified female breast: Secondary | ICD-10-CM

## 2014-05-09 DIAGNOSIS — C50812 Malignant neoplasm of overlapping sites of left female breast: Secondary | ICD-10-CM

## 2014-05-09 DIAGNOSIS — Z5189 Encounter for other specified aftercare: Secondary | ICD-10-CM

## 2014-05-09 MED ORDER — PEGFILGRASTIM INJECTION 6 MG/0.6ML
6.0000 mg | Freq: Once | SUBCUTANEOUS | Status: AC
  Administered 2014-05-09: 6 mg via SUBCUTANEOUS
  Filled 2014-05-09: qty 0.6

## 2014-05-09 NOTE — Telephone Encounter (Signed)
Called Stacy Fernandez for chemotherapy F/U.  Patient is doing well.  Denies vomiting.  "I had an esophageal wrap and I can't throw up  I took my medicine and the nausea passed.  Denies any new side effects or symptoms.  Bladder is functioning well.  Took laxative and bowels moved today.  Eating and drinking well and I instructed to drink 64 oz minimum daily or at least the day before, of and after treatment.  Denies questions at this time and encouraged to call if needed.  Reviewed how to call after hours in the case of an emergency.

## 2014-05-09 NOTE — Patient Instructions (Signed)
Pegfilgrastim injection What is this medicine? PEGFILGRASTIM (peg fil GRA stim) is a long-acting granulocyte colony-stimulating factor that stimulates the growth of neutrophils, a type of white blood cell important in the body's fight against infection. It is used to reduce the incidence of fever and infection in patients with certain types of cancer who are receiving chemotherapy that affects the bone marrow. This medicine may be used for other purposes; ask your health care provider or pharmacist if you have questions. COMMON BRAND NAME(S): Neulasta What should I tell my health care provider before I take this medicine? They need to know if you have any of these conditions: -latex allergy -ongoing radiation therapy -sickle cell disease -skin reactions to acrylic adhesives (On-Body Injector only) -an unusual or allergic reaction to pegfilgrastim, filgrastim, other medicines, foods, dyes, or preservatives -pregnant or trying to get pregnant -breast-feeding How should I use this medicine? This medicine is for injection under the skin. If you get this medicine at home, you will be taught how to prepare and give the pre-filled syringe or how to use the On-body Injector. Refer to the patient Instructions for Use for detailed instructions. Use exactly as directed. Take your medicine at regular intervals. Do not take your medicine more often than directed. It is important that you put your used needles and syringes in a special sharps container. Do not put them in a trash can. If you do not have a sharps container, call your pharmacist or healthcare provider to get one. Talk to your pediatrician regarding the use of this medicine in children. Special care may be needed. Overdosage: If you think you have taken too much of this medicine contact a poison control center or emergency room at once. NOTE: This medicine is only for you. Do not share this medicine with others. What if I miss a dose? It is  important not to miss your dose. Call your doctor or health care professional if you miss your dose. If you miss a dose due to an On-body Injector failure or leakage, a new dose should be administered as soon as possible using a single prefilled syringe for manual use. What may interact with this medicine? Interactions have not been studied. Give your health care provider a list of all the medicines, herbs, non-prescription drugs, or dietary supplements you use. Also tell them if you smoke, drink alcohol, or use illegal drugs. Some items may interact with your medicine. This list may not describe all possible interactions. Give your health care provider a list of all the medicines, herbs, non-prescription drugs, or dietary supplements you use. Also tell them if you smoke, drink alcohol, or use illegal drugs. Some items may interact with your medicine. What should I watch for while using this medicine? You may need blood work done while you are taking this medicine. If you are going to need a MRI, CT scan, or other procedure, tell your doctor that you are using this medicine (On-Body Injector only). What side effects may I notice from receiving this medicine? Side effects that you should report to your doctor or health care professional as soon as possible: -allergic reactions like skin rash, itching or hives, swelling of the face, lips, or tongue -dizziness -fever -pain, redness, or irritation at site where injected -pinpoint red spots on the skin -shortness of breath or breathing problems -stomach or side pain, or pain at the shoulder -swelling -tiredness -trouble passing urine Side effects that usually do not require medical attention (report to your doctor   or health care professional if they continue or are bothersome): -bone pain -muscle pain This list may not describe all possible side effects. Call your doctor for medical advice about side effects. You may report side effects to FDA at  1-800-FDA-1088. Where should I keep my medicine? Keep out of the reach of children. Store pre-filled syringes in a refrigerator between 2 and 8 degrees C (36 and 46 degrees F). Do not freeze. Keep in carton to protect from light. Throw away this medicine if it is left out of the refrigerator for more than 48 hours. Throw away any unused medicine after the expiration date. NOTE: This sheet is a summary. It may not cover all possible information. If you have questions about this medicine, talk to your doctor, pharmacist, or health care provider.  2015, Elsevier/Gold Standard. (2013-10-05 16:14:05)  

## 2014-05-09 NOTE — Telephone Encounter (Signed)
Message copied by Cherylynn Ridges on Wed May 09, 2014  3:24 PM ------      Message from: Oliver Hum      Created: Tue May 08, 2014  2:51 PM      Regarding: chemo follow up call       First time Herceptin, Taxotere, and Carbo. Dr. Jana Hakim. ------

## 2014-05-09 NOTE — Telephone Encounter (Signed)
Patient denies and mouth sores, change in bowel habits, pain.  States she has had some slight nausea but the nausea medications are helping it.  Patient aware she is to call with any fever higher than 100.5.  Patient will call with any questions or concerns.

## 2014-05-10 ENCOUNTER — Other Ambulatory Visit: Payer: Self-pay | Admitting: Neurology

## 2014-05-14 ENCOUNTER — Other Ambulatory Visit: Payer: Self-pay | Admitting: *Deleted

## 2014-05-14 ENCOUNTER — Telehealth: Payer: Self-pay | Admitting: *Deleted

## 2014-05-14 DIAGNOSIS — C50412 Malignant neoplasm of upper-outer quadrant of left female breast: Secondary | ICD-10-CM

## 2014-05-14 MED ORDER — MAGIC MOUTHWASH W/LIDOCAINE: 5.0000 mL | Freq: Four times a day (QID) | ORAL | Status: DC | PRN

## 2014-05-14 NOTE — Telephone Encounter (Signed)
Spoke with pt and informed pt that prescription for Magic Mouthwash was called in to pt's pharmacy as per Nira Conn, NP.  Pt voiced understanding.

## 2014-05-14 NOTE — Telephone Encounter (Signed)
Pt called stating that her throat was very raw and painful.  Pt stated she could drink some liquids, not able to eat much.  Pt also stated she was feeling achiness - flu-like symptoms.  Informed pt that flu-like symptom could be related to Neulasta injection.  Pt agreed and acknowledged that she was informed of side effects of Neulasta. Pt had chemo on 05/08/14.  Pt stated mouth and throat very painful despite pt using Biotene mouthwash with no relief.   Message to Dr. Jana Hakim and Tivis Ringer, desk nurse for review.  Pt uses  [facility] in [place]. Pt's  Phone   [phone].

## 2014-05-15 ENCOUNTER — Ambulatory Visit (HOSPITAL_BASED_OUTPATIENT_CLINIC_OR_DEPARTMENT_OTHER): Payer: BC Managed Care – PPO | Admitting: Oncology

## 2014-05-15 ENCOUNTER — Other Ambulatory Visit (HOSPITAL_BASED_OUTPATIENT_CLINIC_OR_DEPARTMENT_OTHER): Payer: BC Managed Care – PPO

## 2014-05-15 VITALS — BP 111/68 | HR 94 | Temp 98.2°F | Resp 18 | Ht 66.0 in | Wt 157.1 lb

## 2014-05-15 DIAGNOSIS — R0789 Other chest pain: Secondary | ICD-10-CM

## 2014-05-15 DIAGNOSIS — C50812 Malignant neoplasm of overlapping sites of left female breast: Secondary | ICD-10-CM

## 2014-05-15 DIAGNOSIS — Z720 Tobacco use: Secondary | ICD-10-CM

## 2014-05-15 DIAGNOSIS — J438 Other emphysema: Secondary | ICD-10-CM

## 2014-05-15 DIAGNOSIS — C50412 Malignant neoplasm of upper-outer quadrant of left female breast: Secondary | ICD-10-CM

## 2014-05-15 DIAGNOSIS — Z87891 Personal history of nicotine dependence: Secondary | ICD-10-CM

## 2014-05-15 DIAGNOSIS — C50419 Malignant neoplasm of upper-outer quadrant of unspecified female breast: Secondary | ICD-10-CM

## 2014-05-15 DIAGNOSIS — Z171 Estrogen receptor negative status [ER-]: Secondary | ICD-10-CM

## 2014-05-15 LAB — CBC WITH DIFFERENTIAL/PLATELET
BASO%: 0.3 % (ref 0.0–2.0)
Basophils Absolute: 0 10*3/uL (ref 0.0–0.1)
EOS ABS: 0 10*3/uL (ref 0.0–0.5)
EOS%: 0.3 % (ref 0.0–7.0)
HCT: 36.8 % (ref 34.8–46.6)
HEMOGLOBIN: 12.2 g/dL (ref 11.6–15.9)
LYMPH%: 25.5 % (ref 14.0–49.7)
MCH: 30.1 pg (ref 25.1–34.0)
MCHC: 33.2 g/dL (ref 31.5–36.0)
MCV: 90.8 fL (ref 79.5–101.0)
MONO#: 0.8 10*3/uL (ref 0.1–0.9)
MONO%: 12.5 % (ref 0.0–14.0)
NEUT%: 61.4 % (ref 38.4–76.8)
NEUTROS ABS: 3.8 10*3/uL (ref 1.5–6.5)
PLATELETS: 183 10*3/uL (ref 145–400)
RBC: 4.05 10*6/uL (ref 3.70–5.45)
RDW: 13.1 % (ref 11.2–14.5)
WBC: 6.2 10*3/uL (ref 3.9–10.3)
lymph#: 1.6 10*3/uL (ref 0.9–3.3)

## 2014-05-15 MED ORDER — GABAPENTIN 300 MG PO CAPS: 300.0000 mg | ORAL_CAPSULE | Freq: Four times a day (QID) | ORAL | Status: DC

## 2014-05-15 MED ORDER — HYDROCODONE-IBUPROFEN 5-200 MG PO TABS: 1.0000 | ORAL_TABLET | Freq: Four times a day (QID) | ORAL | Status: DC | PRN

## 2014-05-15 NOTE — Progress Notes (Signed)
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 She brought it to her primary physician's attention him a and on 02/21/2014 she underwent bilateral diagnostic mammography with tomography at [facility]. The breast composition was category B. In the left breast at the 3:00 position there was an irregular mass associated with skin retraction. Ultrasound performed on the same day confirmed a 1.1 cm irregular solid mass in the left breast at the 3:00 position. There were no abnormalities by sonography in the left axilla or in the superior portion of the breast, which is for the patient experiences some tenderness.  Biopsy of the left breast area in question 02/22/2014 showed ([id number]) and invasive ductal carcinoma, grade 3, estrogen and progesterone receptor negative, with an MIB-1 of 83%, and HER-2 amplified, the signals ratio being 2.07, and a copy number per cell 4.75.  On 02/28/2014 the patient underwent bilateral breast MRI, which showed in the posterior third of the left breast at the 3:00 position an irregular spiculated mass measuring 1.8 cm. The left breast was unremarkable and there were no abnormal appearing lymph nodes.  The patient's subsequent history is as  detailed below  INTERVAL HISTORY: Stacy Fernandez returns today for follow up of her breast cancer accompanied by her husband Stacy Fernandez. Today is day 8 cycle 1 of her cyclophosphamide, docetaxel and trastuzumab, which she receives every 21 days, with Neulasta on day 2.  REVIEW OF SYSTEMS: Stacy Fernandez had very little nausea. She had some mouth sores, for which she used magic mouthwash successfully. She had diarrhea for approximately 3 or 4 days, up to 4 times a day, fairly loose. She was able to keep herself well-hydrated. She did not use Imodium. The big problem she had however is continuing chest wall pain. She describes this as constant. She has seen Dr. Dalbert Batman regarding this and he could not find any abnormality in the examination of her chest. He is wonders if there may have been some nerve entrapment or nerve damage from the surgery. He suggested tramadol, but she found it just did not work for her. Aside from these issues, I detailed review of systems today was negative.  PAST MEDICAL HISTORY: Past Medical History  Diagnosis Date  . Hypothyroidism   . Shortness of breath     2D ECHO, 04/20/2012 - EF >55%, normal; EXERCISE STRESS TEST, 04/20/2012 - normal, EKG negative for ischemia, no ECG changes  . COPD (chronic obstructive pulmonary disease)   . Emphysema   . Hematuria     cause unknow- saw Urologist  . Hot flashes   . Anginal pain     Dr Claiborne Billings cardiologist- test normal.  " think it is from breast cancer"  . Brain aneurysm      Dr Jannifer Franklin neurologist is monitoring  . Anxiety     Due to situation, CA breast  . Chronic bronchitis     "  I've had it quite a few times" (04/02/2014)  . GERD (gastroesophageal reflux disease)   . Migraine     "treated w/botox for them" (04/02/2014)    PAST SURGICAL HISTORY: Past Surgical History  Procedure Laterality Date  . Cholecystectomy    . Appendectomy    . Laparoscopic nissen fundoplication  [id number]/[id number]    for GERD- not a problem  . Video bronchoscopy Bilateral  01/16/2013    Procedure: VIDEO BRONCHOSCOPY WITHOUT FLUORO;  Surgeon: [name], MD;  Location: [facility];  Service: Cardiopulmonary;  Laterality: Bilateral;  . Mastectomy complete / simple w/ sentinel node biopsy Left 04/02/2014    axillary SLN  . Mastectomy complete / simple Right 04/02/2014    PROPHYLACTIC  . Portacath placement Right 04/02/2014  . Breast biopsy Left 11/2013 X 3  . Total abdominal hysterectomy    . Cardiac catheterization  2014  . Simple mastectomy with axillary sentinel node biopsy Bilateral 04/02/2014    Procedure: LEFT TOTAL MASTECTOMY WITH LEFT AXILLARY SENTINEL NODE BIOPSY, RIGHT PROPHYLACTIC MASTETCTOMY;  Surgeon: [name], MD;  Location: [place];  Service: General;  Laterality: Bilateral;  . Portacath placement N/A 04/02/2014    Procedure: INSERTION PORT-A-CATH;  Surgeon: [name], MD;  Location: [facility] OR;  Service: General;  Laterality: N/A;    FAMILY HISTORY Family History  Problem Relation Age of Onset  . Heart failure Mother   . Colon cancer Father 102    stomach cancer also in 58s  . Throat cancer Brother 51    smoker  . Heart attack Maternal Grandmother   . Colon cancer Paternal Grandmother 49  . Cancer Paternal Grandfather     kidney and bladder  . Throat cancer Brother 37    throat cancer, smoker  . Ovarian cancer Sister 83    ovarian cancer at 10, colorectal cancer at 25  . Breast cancer Paternal Aunt 31  . Ovarian cancer Other 66    niece with ovarian cancer   the patient's father died at the age of 21 from metastatic stomach cancer the patient's father's mother died from colon cancer at the age of 85. The patient's mother died at the age of 29. The patient had 2 brothers and 2 sisters. One brother died at the age of 42 from throat cancer metastatic to the lung. He was a smoker. A second brother Was diagnosed at age 38 with throat cancer metastatic to lung. He has been given 90 days to live" is not looking good". One sister died from colon  cancer metastatic to bone. One sister died of a drug overdose. There is no history of breast or bearing cancer in the family to the patient's knowledge.  GYNECOLOGIC HISTORY:  No LMP recorded. Patient has had a hysterectomy. Menarche age 24, first live birth age 89, the patient is Franquez P4. She underwent hysterectomy with bilateral salpingo-oophorectomy approximately 1990. She took hormone replacement for approximately 2 years.  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    ADVANCED DIRECTIVES: Not in place   HEALTH MAINTENANCE: History  Substance Use Topics  . Smoking status: Former Smoker -- 1.00 packs/day for 37 years    Types: Cigarettes    Quit date: 03/29/2014  . Smokeless tobacco: Never Used     Comment: uses e-cig 01/03/13, starting using Chantix 11/13/13  . Alcohol Use: No     Colonoscopy:  August 2015  PAP: May 2013  Bone density:  Lipid panel:  Allergies  Allergen Reactions  . Contrast Media [Iodinated Diagnostic Agents] Anaphylaxis    Heart stops and breathing stops    Current Outpatient Prescriptions  Medication Sig Dispense Refill  . Alum & Mag Hydroxide-Simeth (MAGIC MOUTHWASH W/LIDOCAINE) SOLN Take 5-10 mLs by mouth 4 (four) times daily as needed for mouth pain. Swish and  Swallow  Or   Swish  And  Spit  If unable to swallow.  240 mL  1  . atorvastatin (LIPITOR) 10 MG tablet Take 10 mg by mouth daily.      . budesonide-formoterol (SYMBICORT) 160-4.5 MCG/ACT inhaler Inhale 2 puffs into the lungs 2 (two) times daily.      Marland Kitchen buPROPion (WELLBUTRIN XL) 150 MG 24 hr tablet Take 300 mg by mouth daily.       Marland Kitchen dexamethasone (DECADRON) 4 MG tablet Take 2 tablets (8 mg total) by mouth 2 (two) times daily. Start the day before Taxotere. Then again the day after chemo for 3 days.  30 tablet  1  . HYDROcodone-homatropine (HYCODAN) 5-1.5 MG/5ML syrup Take 5  mLs by mouth every 6 (six) hours as needed for cough.       Marland Kitchen ipratropium-albuterol (DUONEB) 0.5-2.5 (3) MG/3ML SOLN Take 3 mLs by nebulization every 6 (six) hours as needed.      Marland Kitchen levothyroxine (SYNTHROID, LEVOTHROID) 75 MCG tablet Take 75 mcg by mouth daily before breakfast.      . lidocaine-prilocaine (EMLA) cream Apply 1 application topically as needed. Apply over port area 1-2 hours before chemo and cover with plastic wrap  30 g  0  . LORazepam (ATIVAN) 0.5 MG tablet Take 1 tablet (0.5 mg total) by mouth at bedtime as needed (Nausea or vomiting).  30 tablet  0  . montelukast (SINGULAIR) 10 MG tablet Take 10 mg by mouth at bedtime.       . nitroGLYCERIN (NITROSTAT) 0.4 MG SL tablet Place 0.4 mg under the tongue every 5 (five) minutes as needed for chest pain.      Marland Kitchen ondansetron (ZOFRAN) 8 MG tablet Take 1 tablet (8 mg total) by mouth 2 (two) times daily. Start the day after chemo for 3 days. Then take as needed for nausea or vomiting.  30 tablet  1  . oxyCODONE-acetaminophen (PERCOCET) 7.5-325 MG per tablet Take 1 tablet by mouth every 8 (eight) hours as needed for pain.  30 tablet  0  . PROAIR HFA 108 (90 BASE) MCG/ACT inhaler Inhale 2 puffs into the lungs every 4 (four) hours as needed for wheezing or shortness of breath.       . prochlorperazine (COMPAZINE) 10 MG tablet Take 1 tablet (10 mg total) by mouth every 6 (six) hours as needed (Nausea or vomiting).  30 tablet  1  . rizatriptan (MAXALT-MLT) 10 MG disintegrating tablet Take 1 tablet (10 mg total) by mouth as needed for migraine. May repeat in 2 hours if needed  15 tablet  11  . SUMAtriptan (IMITREX) 6 MG/0.5ML SOLN injection Inject 6 mg into the skin every 2 (two) hours as needed for migraine or headache. May repeat in 2 hours if headache persists or recurs. Max 2 doses in 24 hours      . tiZANidine (ZANAFLEX) 2 MG tablet Take 1 tablet (2 mg total) by mouth 2 (two) times daily as needed for muscle spasms.  60 tablet  11  .  tobramycin-dexamethasone (TOBRADEX) ophthalmic solution Place 1  drop into both eyes every 4 (four) hours while awake.  5 mL  0  . traZODone (DESYREL) 50 MG tablet Take 1 tablet (50 mg total) by mouth at bedtime.  30 tablet  4  . verapamil (CALAN-SR) 120 MG CR tablet Take 1 tablet (120 mg total) by mouth daily.  90 tablet  3  . verapamil (CALAN-SR) 120 MG CR tablet TAKE 1 TABLET BY MOUTH AT BEDTIME ( MAX 30 DAY SUPPLY PER INSURANCE)  30 tablet  11   Current Facility-Administered Medications  Medication Dose Route Frequency Provider Last Rate Last Dose  . botulinum toxin Type A (BOTOX) injection 150 Units  150 Units Intramuscular Once Marcial Pacas, MD        OBJECTIVE: Middle-aged white woman who was tearful during today's visit Filed Vitals:   05/15/14 1626  BP: 111/68  Pulse: 94  Temp: 98.2 F (36.8 C)  Resp: 18     Body mass index is 25.37 kg/(m^2).    ECOG FS:2 - Symptomatic, <50% confined to bed Vitals - 1 value per visit 04/03/2014   Sclerae unicteric, pupils equal and reactive Oropharynx clear and slightly dry; no thrush or other lesions noted No cervical or supraclavicular adenopathy Lungs no rales or rhonchi Heart regular rate and rhythm Abd soft, nontender, positive bowel sounds MSK no focal spinal tenderness, no upper extremity lymphedema Neuro: nonfocal, well oriented, appropriate affect Breasts: Status post bilateral mastectomies. The incisions look quite good. There is no swelling, erythema, or dehiscence. There is significant tenderness particularly on the left side to palpation and auscultation. Both axillae are benign.  LAB RESULTS:  CMP     Component Value Date/Time   NA 140 05/07/2014 1453   NA 142 04/03/2014 0420   K 4.5 05/07/2014 1453   K 4.1 04/03/2014 0420   CL 106 04/03/2014 0420   CO2 23 05/07/2014 1453   CO2 19 04/03/2014 0420   GLUCOSE 121 05/07/2014 1453   GLUCOSE 162* 04/03/2014 0420   BUN 17.4 05/07/2014 1453   BUN 8 04/03/2014 0420   CREATININE 1.1  05/07/2014 1453   CREATININE 0.71 04/03/2014 0420   CALCIUM 9.3 05/07/2014 1453   CALCIUM 8.6 04/03/2014 0420   PROT 6.7 05/07/2014 1453   PROT 6.5 04/02/2014 0804   ALBUMIN 3.7 05/07/2014 1453   ALBUMIN 3.4* 04/02/2014 0804   AST 16 05/07/2014 1453   AST 20 04/02/2014 0804   ALT 15 05/07/2014 1453   ALT 21 04/02/2014 0804   ALKPHOS 104 05/07/2014 1453   ALKPHOS 112 04/02/2014 0804   BILITOT 0.34 05/07/2014 1453   BILITOT 0.3 04/02/2014 0804   GFRNONAA >90 04/03/2014 0420   GFRAA >90 04/03/2014 0420    I No results found for this basename: SPEP,  UPEP,   kappa and lambda light chains    Lab Results  Component Value Date   WBC 6.2 05/15/2014   NEUTROABS 3.8 05/15/2014   HGB 12.2 05/15/2014   HCT 36.8 05/15/2014   MCV 90.8 05/15/2014   PLT 183 05/15/2014      Chemistry      Component Value Date/Time   NA 140 05/07/2014 1453   NA 142 04/03/2014 0420   K 4.5 05/07/2014 1453   K 4.1 04/03/2014 0420   CL 106 04/03/2014 0420   CO2 23 05/07/2014 1453   CO2 19 04/03/2014 0420   BUN 17.4 05/07/2014 1453   BUN 8 04/03/2014 0420   CREATININE 1.1 05/07/2014 1453   CREATININE 0.71 04/03/2014 0420  Component Value Date/Time   CALCIUM 9.3 05/07/2014 1453   CALCIUM 8.6 04/03/2014 0420   ALKPHOS 104 05/07/2014 1453   ALKPHOS 112 04/02/2014 0804   AST 16 05/07/2014 1453   AST 20 04/02/2014 0804   ALT 15 05/07/2014 1453   ALT 21 04/02/2014 0804   BILITOT 0.34 05/07/2014 1453   BILITOT 0.3 04/02/2014 0804       No results found for this basename: LABCA2    No components found with this basename: LABCA125    No results found for this basename: INR,  in the last 168 hours  Urinalysis    Component Value Date/Time   COLORURINE YELLOW 12/25/2012 2225   APPEARANCEUR CLOUDY* 12/25/2012 2225   LABSPEC 1.027 12/25/2012 2225   PHURINE 5.0 12/25/2012 2225   GLUCOSEU NEGATIVE 12/25/2012 2225   HGBUR LARGE* 12/25/2012 [place] 12/25/2012 [place] 12/25/2012 2225    PROTEINUR NEGATIVE 12/25/2012 2225   UROBILINOGEN 0.2 12/25/2012 2225   NITRITE NEGATIVE 12/25/2012 2225   LEUKOCYTESUR TRACE* 12/25/2012 2225    STUDIES: Most recent echocardiogram on 03/21/14 showed an ejection fraction of 66%  ASSESSMENT: 53 y.o. BRCA negative [place] woman status post left breast biopsy 02/22/2014 for a clinical T2/T3 NX, stage II or 3 invasive ductal carcinoma, grade 3, estrogen and progesterone receptor negative, with an MIB-1 of 83%, and HER-2 amplified with a signals a ratio of 2.07and a copy number per cell of 4.75  (1) biopsy of an additional area of enhancement in the left breast 03/08/2014 showed ductal carcinoma in situ, estrogen and progesterone receptor negative.  (2) status post bilateral mastectomies with left sentinel lymph node sampling 04/02/2014, showing:  (a) on the right, benign breast tissue including a single negative lymph node  (b) On  the left, a  pT1c pN0, stage IA invasive ductal carcinoma, grade 3, with negative margins  (3) adjuvant chemotherapy started 05/08/2014, to consist of carboplatin and docetaxel given every 3 weeks x6, together with trastuzumab   (4) trastuzumab to be continued to complete a year. Most recent echocardiogram 03/21/2014 showed a normal ejection fraction  (5) reconstruction to follow after completion of chemotherapy  (6) chest wall pain  (7) chest wall pain: The patient quit smoking 03/30/2014  PLAN: I spent approximately one hour today with [name] and her husband going over her situation. [name] did moderately well with her first cycle of chemotherapy. Her counts are excellent. She had minimal to no nausea. The bony aches and pains lasted only a couple of days. She did have some diarrhea, and she did not take Imodium for this. We discussed the appropriate use of Imodium next time she starts having loose bowel movements and the importance of keeping herself well hydrated.  The main problem however remains her chest wall pain.  The chest wall looks and by palpation feels pretty much like it supposed to feel. It is possible as Dr. Dalbert Batman suggests that there has been some nerve entrapment or nerve damage. For that reason I think gabapentin may be a particularly good choice of pain medication for her. We discussed the possible toxicities, side effects and complications of this agent and she will take 300 mg 4 times a day.   For breakthrough pain she will use Hydrea oh/ibuprofen up to 4 times a day and I gave her 60 tablets of those with no refills. She understands that that will constipate her and that, when she is not having the diarrhea from the  chemotherapy, she needs to be on stool softeners and if necessary laxatives while taking the hydrocodone.  She will continue to use the Magic mouthwash, antinausea medications, and eyedrops as prescribed. We are proceeding with cycle 2 on November 10, and she will see [place] again a week after that to make sure that went better area however I have encouraged her to give [place] a call in 2-3 days and let us no other gabapentin is working for her.  She also brought [place] some disability papers today that need to be faxed back before November 3  Stacy Fernandez  agrees with this plan. She knows the goal of treatment in her case is cure. She will let us know of any issues that might arise before her next visit here.  [name], MD   05/15/2014 5:18 PM

## 2014-05-16 NOTE — Addendum Note (Signed)
Addended by: Laureen Abrahams on: 05/16/2014 05:22 PM   Modules accepted: Orders, Medications

## 2014-05-21 ENCOUNTER — Encounter: Payer: Self-pay | Admitting: Oncology

## 2014-05-21 NOTE — Progress Notes (Signed)
Put disability form on nurse's desk. °

## 2014-05-23 ENCOUNTER — Encounter: Payer: Self-pay | Admitting: Oncology

## 2014-05-23 ENCOUNTER — Emergency Department (HOSPITAL_BASED_OUTPATIENT_CLINIC_OR_DEPARTMENT_OTHER): Payer: BC Managed Care – PPO

## 2014-05-23 ENCOUNTER — Emergency Department (HOSPITAL_BASED_OUTPATIENT_CLINIC_OR_DEPARTMENT_OTHER)
Admission: EM | Admit: 2014-05-23 | Discharge: 2014-05-23 | Disposition: A | Discharge status: Home / Self Care | Payer: BC Managed Care – PPO | Attending: Emergency Medicine | Admitting: Emergency Medicine

## 2014-05-23 ENCOUNTER — Encounter (HOSPITAL_BASED_OUTPATIENT_CLINIC_OR_DEPARTMENT_OTHER): Payer: Self-pay

## 2014-05-23 DIAGNOSIS — F419 Anxiety disorder, unspecified: Secondary | ICD-10-CM | POA: Insufficient documentation

## 2014-05-23 DIAGNOSIS — J449 Chronic obstructive pulmonary disease, unspecified: Secondary | ICD-10-CM | POA: Diagnosis not present

## 2014-05-23 DIAGNOSIS — S92511A Displaced fracture of proximal phalanx of right lesser toe(s), initial encounter for closed fracture: Secondary | ICD-10-CM | POA: Insufficient documentation

## 2014-05-23 DIAGNOSIS — Z79899 Other long term (current) drug therapy: Secondary | ICD-10-CM | POA: Diagnosis not present

## 2014-05-23 DIAGNOSIS — S99921A Unspecified injury of right foot, initial encounter: Secondary | ICD-10-CM | POA: Diagnosis present

## 2014-05-23 DIAGNOSIS — E039 Hypothyroidism, unspecified: Secondary | ICD-10-CM | POA: Diagnosis not present

## 2014-05-23 DIAGNOSIS — K219 Gastro-esophageal reflux disease without esophagitis: Secondary | ICD-10-CM | POA: Insufficient documentation

## 2014-05-23 DIAGNOSIS — G43909 Migraine, unspecified, not intractable, without status migrainosus: Secondary | ICD-10-CM | POA: Diagnosis not present

## 2014-05-23 DIAGNOSIS — T1490XA Injury, unspecified, initial encounter: Secondary | ICD-10-CM

## 2014-05-23 DIAGNOSIS — Z7952 Long term (current) use of systemic steroids: Secondary | ICD-10-CM | POA: Insufficient documentation

## 2014-05-23 DIAGNOSIS — Z8742 Personal history of other diseases of the female genital tract: Secondary | ICD-10-CM | POA: Diagnosis not present

## 2014-05-23 DIAGNOSIS — Z87891 Personal history of nicotine dependence: Secondary | ICD-10-CM | POA: Diagnosis not present

## 2014-05-23 DIAGNOSIS — Y9289 Other specified places as the place of occurrence of the external cause: Secondary | ICD-10-CM | POA: Diagnosis not present

## 2014-05-23 DIAGNOSIS — S92911A Unspecified fracture of right toe(s), initial encounter for closed fracture: Secondary | ICD-10-CM

## 2014-05-23 DIAGNOSIS — Z8679 Personal history of other diseases of the circulatory system: Secondary | ICD-10-CM | POA: Diagnosis not present

## 2014-05-23 DIAGNOSIS — Y9389 Activity, other specified: Secondary | ICD-10-CM | POA: Insufficient documentation

## 2014-05-23 DIAGNOSIS — W228XXA Striking against or struck by other objects, initial encounter: Secondary | ICD-10-CM | POA: Insufficient documentation

## 2014-05-23 NOTE — ED Provider Notes (Signed)
CSN: [id number]     Arrival date & time 05/23/14  2048 History   First MD Initiated Contact with Patient 05/23/14 2112     Chief Complaint  Patient presents with  . Toe Injury     (Consider location/radiation/quality/duration/timing/severity/associated sxs/prior Treatment) HPI Comments: Pt comes in today with c/o right small toe pain after hitting on a couch tonight. States that she tried to get a post op shoe at the store but they wouldn't give her one so she decided to come in because the bruising and swelling have increased. Is on pain medication for oncology treatment. She hasn't tried anything on this injury  The history is provided by the patient. No language interpreter was used.    Past Medical History  Diagnosis Date  . Hypothyroidism   . Shortness of breath     2D ECHO, 04/20/2012 - EF >55%, normal; EXERCISE STRESS TEST, 04/20/2012 - normal, EKG negative for ischemia, no ECG changes  . COPD (chronic obstructive pulmonary disease)   . Emphysema   . Hematuria     cause unknow- saw Urologist  . Hot flashes   . Anginal pain     Dr Claiborne Billings cardiologist- test normal.  " think it is from breast cancer"  . Brain aneurysm      Dr Jannifer Franklin neurologist is monitoring  . Anxiety     Due to situation, CA breast  . Chronic bronchitis     "I've had it quite a few times" (04/02/2014)  . GERD (gastroesophageal reflux disease)   . Migraine     "treated w/botox for them" (04/02/2014)   Past Surgical History  Procedure Laterality Date  . Cholecystectomy    . Appendectomy    . Laparoscopic nissen fundoplication  08/1222    for GERD- not a problem  . Video bronchoscopy Bilateral 01/16/2013    Procedure: VIDEO BRONCHOSCOPY WITHOUT FLUORO;  Surgeon: [name], MD;  Location: [facility];  Service: Cardiopulmonary;  Laterality: Bilateral;  . Mastectomy complete / simple w/ sentinel node biopsy Left 04/02/2014    axillary SLN  . Mastectomy complete / simple Right 04/02/2014    PROPHYLACTIC   . Portacath placement Right 04/02/2014  . Breast biopsy Left 11/2013 X 3  . Total abdominal hysterectomy    . Cardiac catheterization  2014  . Simple mastectomy with axillary sentinel node biopsy Bilateral 04/02/2014    Procedure: LEFT TOTAL MASTECTOMY WITH LEFT AXILLARY SENTINEL NODE BIOPSY, RIGHT PROPHYLACTIC MASTETCTOMY;  Surgeon: [name], MD;  Location: [place];  Service: General;  Laterality: Bilateral;  . Portacath placement N/A 04/02/2014    Procedure: INSERTION PORT-A-CATH;  Surgeon: [name], MD;  Location: [facility];  Service: General;  Laterality: N/A;   Family History  Problem Relation Age of Onset  . Heart failure Mother   . Colon cancer Father 9    stomach cancer also in 78s  . Throat cancer Brother 45    smoker  . Heart attack Maternal Grandmother   . Colon cancer Paternal Grandmother 78  . Cancer Paternal Grandfather     kidney and bladder  . Throat cancer Brother 37    throat cancer, smoker  . Ovarian cancer Sister 80    ovarian cancer at 41, colorectal cancer at 70  . Breast cancer Paternal Aunt 4  . Ovarian cancer Other 15    niece with ovarian cancer   History  Substance Use Topics  . Smoking status: Former Smoker -- 0.00 packs/day for 37 years  Quit date: 03/29/2014  . Smokeless tobacco: Never Used     Comment: uses e-cig 01/03/13, starting using Chantix 11/13/13  . Alcohol Use: No   OB History    No data available     Review of Systems  All other systems reviewed and are negative.     Allergies  Contrast media  Home Medications   Prior to Admission medications   Medication Sig Start Date End Date Taking? Authorizing Provider  Alum & Mag Hydroxide-Simeth (MAGIC MOUTHWASH W/LIDOCAINE) SOLN Take 5-10 mLs by mouth 4 (four) times daily as needed for mouth pain. Swish and  Swallow  Or   Swish  And  Spit  If unable to swallow. 05/14/14   Marcelino Duster, NP  atorvastatin (LIPITOR) 10 MG tablet Take 10 mg by mouth daily. 07/12/13    Historical Provider, MD  buPROPion (WELLBUTRIN XL) 150 MG 24 hr tablet Take 300 mg by mouth daily.     Historical Provider, MD  dexamethasone (DECADRON) 4 MG tablet Take 2 tablets (8 mg total) by mouth 2 (two) times daily. Start the day before Taxotere. Then again the day after chemo for 3 days. 04/18/14   [name], MD  gabapentin (NEURONTIN) 300 MG capsule Take 1 capsule (300 mg total) by mouth 4 (four) times daily. 05/15/14   [name], MD  hydrocodone-ibuprofen (VICOPROFEN) 5-200 MG per tablet Take 1 tablet by mouth every 6 (six) hours as needed for pain. 05/15/14   [name], MD  ipratropium-albuterol (DUONEB) 0.5-2.5 (3) MG/3ML SOLN Take 3 mLs by nebulization every 6 (six) hours as needed.    Historical Provider, MD  levothyroxine (SYNTHROID, LEVOTHROID) 75 MCG tablet Take 75 mcg by mouth daily before breakfast.    Historical Provider, MD  lidocaine-prilocaine (EMLA) cream Apply 1 application topically as needed. Apply over port area 1-2 hours before chemo and cover with plastic wrap 04/18/14   [name], MD  LORazepam (ATIVAN) 0.5 MG tablet Take 1 tablet (0.5 mg total) by mouth at bedtime as needed (Nausea or vomiting). 04/18/14   [name], MD  montelukast (SINGULAIR) 10 MG tablet Take 10 mg by mouth at bedtime.  11/09/13   Historical Provider, MD  ondansetron (ZOFRAN) 8 MG tablet Take 1 tablet (8 mg total) by mouth 2 (two) times daily. Start the day after chemo for 3 days. Then take as needed for nausea or vomiting. 04/18/14   [name], MD  PROAIR HFA 108 [phone] BASE) MCG/ACT inhaler Inhale 2 puffs into the lungs every 4 (four) hours as needed for wheezing or shortness of breath.  01/19/13   Historical Provider, MD  prochlorperazine (COMPAZINE) 10 MG tablet Take 1 tablet (10 mg total) by mouth every 6 (six) hours as needed (Nausea or vomiting). 04/18/14   [name], MD  rizatriptan (MAXALT-MLT) 10 MG disintegrating tablet Take 1 tablet (10 mg total)  by mouth as needed for migraine. May repeat in 2 hours if needed 03/14/14   Marcial Pacas, MD  SUMAtriptan (IMITREX) 6 MG/0.5ML SOLN injection Inject 6 mg into the skin every 2 (two) hours as needed for migraine or headache. May repeat in 2 hours if headache persists or recurs. Max 2 doses in 24 hours    Historical Provider, MD  tiZANidine (ZANAFLEX) 2 MG tablet Take 1 tablet (2 mg total) by mouth 2 (two) times daily as needed for muscle spasms. 03/14/14   Marcial Pacas, MD  tobramycin-dexamethasone [facility]) ophthalmic solution Place 1  drop into both eyes every 4 (four) hours while awake. 04/18/14   [name], MD  traZODone (DESYREL) 50 MG tablet Take 1 tablet (50 mg total) by mouth at bedtime. 03/07/14   [name], MD  verapamil (CALAN-SR) 120 MG CR tablet TAKE 1 TABLET BY MOUTH AT BEDTIME ( MAX 30 DAY SUPPLY PER INSURANCE) 05/10/14   Marcial Pacas, MD   BP 108/57 mmHg  Pulse 88  Temp(Src) 97.9 F (36.6 C) (Oral)  Ht 5' 6.5" (1.689 m)  Wt 152 lb (68.947 kg)  BMI 24.17 kg/m2  SpO2 98% Physical Exam  Constitutional: She appears well-developed and well-nourished.  Cardiovascular: Normal rate and regular rhythm.   Pulmonary/Chest: Effort normal and breath sounds normal.  Musculoskeletal:  Bruising swelling and tenderness noted to the right small toe  Nursing note and vitals reviewed.   ED Course  Procedures (including critical care time) Labs Review Labs Reviewed - No data to display  Imaging Review Dg Toe 5th Right  05/23/2014   CLINICAL DATA:  Patient hit toe upon corner of couch  EXAM: RIGHT FIFTH TOE  COMPARISON:  None.  FINDINGS: Frontal, oblique, and lateral views were obtained. There is a fracture of the distal aspect of the fifth proximal phalanx with slight lateral and dorsal displacement of the distal fracture fragment with respect proximal fragment. No other fracture. No dislocation. There is evidence of old trauma with remodeling involving the distal fourth proximal phalanx.   IMPRESSION: Acute fracture distal aspect fifth proximal phalanx, mildly displaced.   Electronically Signed   By: Lowella Grip M.D.   On: 05/23/2014 21:21     EKG Interpretation None      MDM   Final diagnoses:  Toe fracture, right, closed, initial encounter    Toe buddy taped and placed in post op shoe. Already has pain medication    Glendell Docker, NP 05/23/14 2142  Wandra Arthurs, MD 05/23/14 [phone]

## 2014-05-23 NOTE — ED Notes (Signed)
Right pinky toe injury this am-bruising noted

## 2014-05-23 NOTE — Progress Notes (Signed)
Faxed disability form to [phone]

## 2014-05-23 NOTE — Discharge Instructions (Signed)
Toe Fracture °A toe fracture is a break in the bone of a toe. It may take 6 to 8 weeks for the toe injury to heal. °HOME CARE °· "Buddy taping" is taping the broken toe to the toe next to it. Leave the toes taped together for at least 1 week or as told by your doctor. Change the tape after bathing. Always use a small piece of gauze or cotton between the toes when taping them together. °· Put ice on the injured area. °¨ Put ice in a plastic bag. °¨ Place a towel between your skin and the bag. °¨ Leave the ice on for 15-20 minutes, 03-04 times a day. °· After the first 2 days, put heat on the injured area. Use heat for the next 2 to 3 days. Put a heating pad on the foot or soak the foot in warm water as told by your doctor. °· Keep the foot raised (elevated) above the level of your heart. °· Wear sturdy, supportive shoes. The shoes should not pinch the toes or fit tightly against the toes. °· Use a cast shoe (if prescribed) if the foot is very puffy (swollen). °· Use crutches if you have pain or it hurts too much to walk. °· Only take medicine as told by your doctor. °· Follow up with your doctor as told. °GET HELP RIGHT AWAY IF:  °· There is pain or puffiness that is not helped by medicine. °· The pain does not get better after 1 week. °· The toe is cold when the others are warm. °· The toe loses feeling (numb) or turns white. °· The toe becomes hot and red (inflamed). °MAKE SURE YOU:  °· Understand these instructions. °· Will watch this condition. °· Will get help right away if you are not doing well or get worse. °Document Released: 12/23/2007 Document Revised: 09/28/2011 Document Reviewed: 11/29/2009 °[employer]® Patient Information ©2015 [employer]. This information is not intended to replace advice given to you by your health care provider. Make sure you discuss any questions you have with your health care provider. ° °

## 2014-05-29 ENCOUNTER — Encounter: Payer: Self-pay | Admitting: Nurse Practitioner

## 2014-05-29 ENCOUNTER — Ambulatory Visit (HOSPITAL_BASED_OUTPATIENT_CLINIC_OR_DEPARTMENT_OTHER): Payer: BC Managed Care – PPO | Admitting: Nurse Practitioner

## 2014-05-29 ENCOUNTER — Ambulatory Visit (HOSPITAL_BASED_OUTPATIENT_CLINIC_OR_DEPARTMENT_OTHER): Payer: BC Managed Care – PPO

## 2014-05-29 ENCOUNTER — Other Ambulatory Visit (HOSPITAL_BASED_OUTPATIENT_CLINIC_OR_DEPARTMENT_OTHER): Payer: BC Managed Care – PPO

## 2014-05-29 ENCOUNTER — Other Ambulatory Visit: Payer: Self-pay | Admitting: *Deleted

## 2014-05-29 ENCOUNTER — Other Ambulatory Visit: Payer: Self-pay | Admitting: Oncology

## 2014-05-29 VITALS — BP 106/66 | HR 82 | Temp 97.7°F | Resp 18 | Ht 66.5 in | Wt 163.6 lb

## 2014-05-29 DIAGNOSIS — Z5111 Encounter for antineoplastic chemotherapy: Secondary | ICD-10-CM

## 2014-05-29 DIAGNOSIS — C50412 Malignant neoplasm of upper-outer quadrant of left female breast: Secondary | ICD-10-CM

## 2014-05-29 DIAGNOSIS — Z5112 Encounter for antineoplastic immunotherapy: Secondary | ICD-10-CM

## 2014-05-29 DIAGNOSIS — Z171 Estrogen receptor negative status [ER-]: Secondary | ICD-10-CM

## 2014-05-29 DIAGNOSIS — C50812 Malignant neoplasm of overlapping sites of left female breast: Secondary | ICD-10-CM

## 2014-05-29 DIAGNOSIS — C50419 Malignant neoplasm of upper-outer quadrant of unspecified female breast: Secondary | ICD-10-CM

## 2014-05-29 DIAGNOSIS — R197 Diarrhea, unspecified: Secondary | ICD-10-CM

## 2014-05-29 DIAGNOSIS — D6481 Anemia due to antineoplastic chemotherapy: Secondary | ICD-10-CM

## 2014-05-29 DIAGNOSIS — R0789 Other chest pain: Secondary | ICD-10-CM

## 2014-05-29 LAB — CBC WITH DIFFERENTIAL/PLATELET
BASO%: 0.6 % (ref 0.0–2.0)
Basophils Absolute: 0 10*3/uL (ref 0.0–0.1)
EOS ABS: 0 10*3/uL (ref 0.0–0.5)
EOS%: 0.1 % (ref 0.0–7.0)
HCT: 30 % — ABNORMAL LOW (ref 34.8–46.6)
HGB: 10 g/dL — ABNORMAL LOW (ref 11.6–15.9)
LYMPH%: 18.7 % (ref 14.0–49.7)
MCH: 30.9 pg (ref 25.1–34.0)
MCHC: 33.5 g/dL (ref 31.5–36.0)
MCV: 92.2 fL (ref 79.5–101.0)
MONO#: 0.7 10*3/uL (ref 0.1–0.9)
MONO%: 8.5 % (ref 0.0–14.0)
NEUT%: 72.1 % (ref 38.4–76.8)
NEUTROS ABS: 5.7 10*3/uL (ref 1.5–6.5)
PLATELETS: 282 10*3/uL (ref 145–400)
RBC: 3.25 10*6/uL — AB (ref 3.70–5.45)
RDW: 14 % (ref 11.2–14.5)
WBC: 7.9 10*3/uL (ref 3.9–10.3)
lymph#: 1.5 10*3/uL (ref 0.9–3.3)

## 2014-05-29 LAB — COMPREHENSIVE METABOLIC PANEL (CC13)
ALK PHOS: 93 U/L (ref 40–150)
ALT: 13 U/L (ref 0–55)
AST: 10 U/L (ref 5–34)
Albumin: 3.7 g/dL (ref 3.5–5.0)
Anion Gap: 6 mEq/L (ref 3–11)
BUN: 19.2 mg/dL (ref 7.0–26.0)
CO2: 24 mEq/L (ref 22–29)
Calcium: 9.2 mg/dL (ref 8.4–10.4)
Chloride: 112 mEq/L — ABNORMAL HIGH (ref 98–109)
Creatinine: 1 mg/dL (ref 0.6–1.1)
GLUCOSE: 111 mg/dL (ref 70–140)
Potassium: 3.9 mEq/L (ref 3.5–5.1)
Sodium: 142 mEq/L (ref 136–145)
TOTAL PROTEIN: 6.6 g/dL (ref 6.4–8.3)
Total Bilirubin: 0.23 mg/dL (ref 0.20–1.20)

## 2014-05-29 MED ORDER — SODIUM CHLORIDE 0.9 % IV SOLN
510.0000 mg | Freq: Once | INTRAVENOUS | Status: AC
  Administered 2014-05-29: 510 mg via INTRAVENOUS
  Filled 2014-05-29: qty 51

## 2014-05-29 MED ORDER — DIPHENHYDRAMINE HCL 25 MG PO CAPS
ORAL_CAPSULE | ORAL | Status: AC
  Filled 2014-05-29: qty 1

## 2014-05-29 MED ORDER — TOBRAMYCIN-DEXAMETHASONE 0.3-0.1 % OP SUSP: 1.0000 [drp] | OPHTHALMIC | Status: DC

## 2014-05-29 MED ORDER — TRASTUZUMAB CHEMO INJECTION 440 MG
6.0000 mg/kg | Freq: Once | INTRAVENOUS | Status: AC
  Administered 2014-05-29: 462 mg via INTRAVENOUS
  Filled 2014-05-29: qty 22

## 2014-05-29 MED ORDER — HEPARIN SOD (PORK) LOCK FLUSH 100 UNIT/ML IV SOLN
500.0000 [IU] | Freq: Once | INTRAVENOUS | Status: DC | PRN
Start: 2014-05-29 — End: 2014-05-29
  Filled 2014-05-29: qty 5

## 2014-05-29 MED ORDER — ONDANSETRON 16 MG/50ML IVPB (CHCC)
INTRAVENOUS | Status: AC
  Filled 2014-05-29: qty 16

## 2014-05-29 MED ORDER — ONDANSETRON 16 MG/50ML IVPB (CHCC)
16.0000 mg | Freq: Once | INTRAVENOUS | Status: AC
  Administered 2014-05-29: 16 mg via INTRAVENOUS

## 2014-05-29 MED ORDER — SODIUM CHLORIDE 0.9 % IJ SOLN
10.0000 mL | INTRAMUSCULAR | Status: DC | PRN
  Administered 2014-05-29: 10 mL
  Filled 2014-05-29: qty 10

## 2014-05-29 MED ORDER — SODIUM CHLORIDE 0.9 % IJ SOLN
10.0000 mL | INTRAMUSCULAR | Status: DC | PRN
  Filled 2014-05-29: qty 10

## 2014-05-29 MED ORDER — DEXAMETHASONE SODIUM PHOSPHATE 20 MG/5ML IJ SOLN
INTRAMUSCULAR | Status: AC
  Filled 2014-05-29: qty 5

## 2014-05-29 MED ORDER — SODIUM CHLORIDE 0.9 % IV SOLN
Freq: Once | INTRAVENOUS | Status: AC
  Administered 2014-05-29: 15:00:00 via INTRAVENOUS

## 2014-05-29 MED ORDER — ACETAMINOPHEN 325 MG PO TABS
650.0000 mg | ORAL_TABLET | Freq: Once | ORAL | Status: AC
  Administered 2014-05-29: 650 mg via ORAL

## 2014-05-29 MED ORDER — ONDANSETRON HCL 8 MG PO TABS: 8.0000 mg | ORAL_TABLET | Freq: Two times a day (BID) | ORAL | Status: DC

## 2014-05-29 MED ORDER — ACETAMINOPHEN 325 MG PO TABS
ORAL_TABLET | ORAL | Status: AC
Start: 2014-05-29 — End: 2014-05-29
  Filled 2014-05-29: qty 2

## 2014-05-29 MED ORDER — SODIUM CHLORIDE 0.9 % IV SOLN: Freq: Once | INTRAVENOUS | Status: AC

## 2014-05-29 MED ORDER — DEXTROSE 5 % IV SOLN
75.0000 mg/m2 | Freq: Once | INTRAVENOUS | Status: AC
  Administered 2014-05-29: 140 mg via INTRAVENOUS
  Filled 2014-05-29: qty 14

## 2014-05-29 MED ORDER — LIDOCAINE-PRILOCAINE 2.5-2.5 % EX CREA: 1.0000 "application " | TOPICAL_CREAM | CUTANEOUS | Status: DC | PRN

## 2014-05-29 MED ORDER — DEXAMETHASONE SODIUM PHOSPHATE 20 MG/5ML IJ SOLN
20.0000 mg | Freq: Once | INTRAMUSCULAR | Status: AC
  Administered 2014-05-29: 20 mg via INTRAVENOUS

## 2014-05-29 MED ORDER — HEPARIN SOD (PORK) LOCK FLUSH 100 UNIT/ML IV SOLN
500.0000 [IU] | Freq: Once | INTRAVENOUS | Status: AC | PRN
  Administered 2014-05-29: 500 [IU]
  Filled 2014-05-29: qty 5

## 2014-05-29 MED ORDER — DIPHENHYDRAMINE HCL 25 MG PO CAPS
25.0000 mg | ORAL_CAPSULE | Freq: Once | ORAL | Status: AC
  Administered 2014-05-29: 25 mg via ORAL

## 2014-05-29 NOTE — Progress Notes (Signed)
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 She brought it to her primary physician's attention him a and on 02/21/2014 she underwent bilateral diagnostic mammography with tomography at [facility]. The breast composition was category B. In the left breast at the 3:00 position there was an irregular mass associated with skin retraction. Ultrasound performed on the same day confirmed a 1.1 cm irregular solid mass in the left breast at the 3:00 position. There were no abnormalities by sonography in the left axilla or in the superior portion of the breast, which is for the patient experiences some tenderness.  Biopsy of the left breast area in question 02/22/2014 showed ([id number]) and invasive ductal carcinoma, grade 3, estrogen and progesterone receptor negative, with an MIB-1 of 83%, and HER-2 amplified, the signals ratio being 2.07, and a copy number per cell 4.75.  On 02/28/2014 the patient underwent bilateral breast MRI, which showed in the posterior third of the left breast at the 3:00 position an irregular spiculated mass measuring 1.8 cm. The left breast was unremarkable and there were no abnormal appearing lymph nodes.  The patient's subsequent history is as  detailed below  INTERVAL HISTORY: Stacy Fernandez returns today for follow up of her breast cancer accompanied by her husband Stacy Fernandez. Today is day 1 cycle 2 of her cyclophosphamide, docetaxel and trastuzumab, which she receives every 21 days, with Neulasta on day 2.  The interval history is remarkable for an ED visit last week for pain to her right small toe after hitting it on a couch. It turns out this toe is broken and she is now wearing a post op shoe. The hydrocodone Dr. Jana Hakim prescribed for her chest wall pain is sufficient to cover her toe pain at this time. Her chest wall pain is much improved on the hydrocodone and gabapentin, and the patient is pleased with this regimen.  REVIEW OF SYSTEMS:  Stacy Fernandez denies fevers, or chills. The nausea she experienced acutely after chemo was well managed with her PRN antiemetics. Her appetite has decreased some, but she is staying well hydrated. She has diarrhea just about every time she eats, but has been afraid to use the imodium because she thought she would be constipated from the pain meds. Her mouth sores have resolved and she is no longer in need of the magic mouth wash. She denies peripheral neuropathy symptoms. She has some fatigue, but this is manageable. She is losing her hair and becomes tearful during this part of her office visit. She endorses some anxiety and depression related to her diagnosis. She has a brother with head/neck cancer who is battling his 3rd recurrence and there is not much else that can be done. A detailed review of systems is otherwise noncontributory.   PAST MEDICAL HISTORY: Past Medical History  Diagnosis Date  . Hypothyroidism   . Shortness of breath  2D ECHO, 04/20/2012 - EF >55%, normal; EXERCISE STRESS TEST, 04/20/2012 - normal, EKG negative for ischemia, no ECG changes  . COPD (chronic obstructive pulmonary disease)   . Emphysema   . Hematuria     cause unknow- saw Urologist  . Hot flashes   . Anginal pain     Dr Claiborne Billings  cardiologist- test normal.  " think it is from breast cancer"  . Brain aneurysm      Dr Jannifer Franklin neurologist is monitoring  . Anxiety     Due to situation, CA breast  . Chronic bronchitis     "I've had it quite a few times" (04/02/2014)  . GERD (gastroesophageal reflux disease)   . Migraine     "treated w/botox for them" (04/02/2014)    PAST SURGICAL HISTORY: Past Surgical History  Procedure Laterality Date  . Cholecystectomy    . Appendectomy    . Laparoscopic nissen fundoplication  01/2535    for GERD- not a problem  . Video bronchoscopy Bilateral 01/16/2013    Procedure: VIDEO BRONCHOSCOPY WITHOUT FLUORO;  Surgeon: [name], MD;  Location: [facility];  Service: Cardiopulmonary;  Laterality: Bilateral;  . Mastectomy complete / simple w/ sentinel node biopsy Left 04/02/2014    axillary SLN  . Mastectomy complete / simple Right 04/02/2014    PROPHYLACTIC  . Portacath placement Right 04/02/2014  . Breast biopsy Left 11/2013 X 3  . Total abdominal hysterectomy    . Cardiac catheterization  2014  . Simple mastectomy with axillary sentinel node biopsy Bilateral 04/02/2014    Procedure: LEFT TOTAL MASTECTOMY WITH LEFT AXILLARY SENTINEL NODE BIOPSY, RIGHT PROPHYLACTIC MASTETCTOMY;  Surgeon: [name], MD;  Location: [place];  Service: General;  Laterality: Bilateral;  . Portacath placement N/A 04/02/2014    Procedure: INSERTION PORT-A-CATH;  Surgeon: [name], MD;  Location: [facility] OR;  Service: General;  Laterality: N/A;    FAMILY HISTORY Family History  Problem Relation Age of Onset  . Heart failure Mother   . Colon cancer Father 63    stomach cancer also in 72s  . Throat cancer Brother 6    smoker  . Heart attack Maternal Grandmother   . Colon cancer Paternal Grandmother 77  . Cancer Paternal Grandfather     kidney and bladder  . Throat cancer Brother 37    throat cancer, smoker  . Ovarian cancer Sister 40    ovarian cancer at 37, colorectal cancer at 108  . Breast  cancer Paternal Aunt 87  . Ovarian cancer Other 3    niece with ovarian cancer   the patient's father died at the age of 29 from metastatic stomach cancer the patient's father's mother died from colon cancer at the age of 19. The patient's mother died at the age of 71. The patient had 2 brothers and 2 sisters. One brother died at the age of 80 from throat cancer metastatic to the lung. He was a smoker. A second brother Was diagnosed at age 33 with throat cancer metastatic to lung. He has been given 90 days to live" is not looking good". One sister died from colon cancer metastatic to bone. One sister died of a drug overdose. There is no history of breast or bearing cancer in the family to the patient's knowledge.  GYNECOLOGIC HISTORY:  No LMP recorded. Patient has had a hysterectomy. Menarche age 62, first live birth age 35, the patient is Emm[place] P4. She underwent hysterectomy with bilateral salpingo-oophorectomy approximately 1990. She took  hormone replacement for approximately 2 years.  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    ADVANCED DIRECTIVES: Not in place   HEALTH MAINTENANCE: History  Substance Use Topics  . Smoking status: Former Smoker -- 0.00 packs/day for 37 years    Quit date: 03/29/2014  . Smokeless tobacco: Never Used     Comment: uses e-cig 01/03/13, starting using Chantix 11/13/13  . Alcohol Use: No     Colonoscopy: August 2015  PAP: May 2013  Bone density:  Lipid panel:  Allergies  Allergen Reactions  . Contrast Media [Iodinated Diagnostic Agents] Anaphylaxis    Heart stops and breathing stops    Current Outpatient Prescriptions  Medication Sig Dispense Refill  . buPROPion (WELLBUTRIN XL) 150 MG 24 hr tablet Take 300 mg by mouth daily.     Marland Kitchen dexamethasone (DECADRON) 4 MG tablet Take 2 tablets (8 mg total) by mouth 2 (two) times daily. Start  the day before Taxotere. Then again the day after chemo for 3 days. 30 tablet 1  . gabapentin (NEURONTIN) 300 MG capsule Take 1 capsule (300 mg total) by mouth 4 (four) times daily. 120 capsule 6  . hydrocodone-ibuprofen (VICOPROFEN) 5-200 MG per tablet Take 1 tablet by mouth every 6 (six) hours as needed for pain. 60 tablet 0  . ipratropium-albuterol (DUONEB) 0.5-2.5 (3) MG/3ML SOLN Take 3 mLs by nebulization every 6 (six) hours as needed.    Marland Kitchen levothyroxine (SYNTHROID, LEVOTHROID) 75 MCG tablet Take 75 mcg by mouth daily before breakfast.    . lidocaine-prilocaine (EMLA) cream Apply 1 application topically as needed. Apply over port area 1-2 hours before chemo and cover with plastic wrap 30 g 0  . LORazepam (ATIVAN) 0.5 MG tablet Take 1 tablet (0.5 mg total) by mouth at bedtime as needed (Nausea or vomiting). 30 tablet 0  . montelukast (SINGULAIR) 10 MG tablet Take 10 mg by mouth at bedtime.     . ondansetron (ZOFRAN) 8 MG tablet Take 1 tablet (8 mg total) by mouth 2 (two) times daily. Start the day after chemo for 3 days. Then take as needed for nausea or vomiting. 30 tablet 1  . prochlorperazine (COMPAZINE) 10 MG tablet Take 1 tablet (10 mg total) by mouth every 6 (six) hours as needed (Nausea or vomiting). 30 tablet 1  . rizatriptan (MAXALT-MLT) 10 MG disintegrating tablet Take 1 tablet (10 mg total) by mouth as needed for migraine. May repeat in 2 hours if needed 15 tablet 11  . tiZANidine (ZANAFLEX) 2 MG tablet Take 1 tablet (2 mg total) by mouth 2 (two) times daily as needed for muscle spasms. 60 tablet 11  . tobramycin-dexamethasone (TOBRADEX) ophthalmic solution Place 1 drop into both eyes every 4 (four) hours while awake. 5 mL 0  . traZODone (DESYREL) 50 MG tablet Take 1 tablet (50 mg total) by mouth at bedtime. 30 tablet 4  . verapamil (CALAN-SR) 120 MG CR tablet TAKE 1 TABLET BY MOUTH AT BEDTIME ( MAX 30 DAY SUPPLY PER INSURANCE) 30 tablet 11  . Alum & Mag Hydroxide-Simeth (MAGIC  MOUTHWASH W/LIDOCAINE) SOLN Take 5-10 mLs by mouth 4 (four) times daily as needed for mouth pain. Swish and  Swallow  Or   Swish  And  Spit  If unable to swallow. 240 mL 1  . atorvastatin (LIPITOR) 10 MG tablet Take 10 mg by mouth daily.    Marland Kitchen  PROAIR HFA 108 (90 BASE) MCG/ACT inhaler Inhale 2 puffs into the lungs every 4 (four) hours as needed for wheezing or shortness of breath.     . SUMAtriptan (IMITREX) 6 MG/0.5ML SOLN injection Inject 6 mg into the skin every 2 (two) hours as needed for migraine or headache. May repeat in 2 hours if headache persists or recurs. Max 2 doses in 24 hours     Current Facility-Administered Medications  Medication Dose Route Frequency Provider Last Rate Last Dose  . botulinum toxin Type A (BOTOX) injection 150 Units  150 Units Intramuscular Once Marcial Pacas, MD        OBJECTIVE: Middle-aged white woman who was tearful during today's visit Filed Vitals:   05/29/14 1338  BP: 106/66  Pulse: 82  Temp: 97.7 F (36.5 C)  Resp: 18     Body mass index is 26.01 kg/(m^2).    ECOG FS:2 - Symptomatic, <50% confined to bed Vitals - 1 value per visit 04/03/2014   Skin: warm, dry  HEENT: sclerae anicteric, conjunctivae pink, oropharynx clear. No thrush or mucositis.  Lymph Nodes: No cervical or supraclavicular lymphadenopathy  Lungs: clear to auscultation bilaterally, no rales, wheezes, or rhonci  Heart: regular rate and rhythm  Abdomen: round, soft, non tender, positive bowel sounds  Musculoskeletal: No focal spinal tenderness, no peripheral edema  Neuro: non focal, well oriented, positive affect  Breasts: deferred  LAB RESULTS:  CMP     Component Value Date/Time   NA 142 05/29/2014 1321   NA 142 04/03/2014 0420   K 3.9 05/29/2014 1321   K 4.1 04/03/2014 0420   CL 106 04/03/2014 0420   CO2 24 05/29/2014 1321   CO2 19 04/03/2014 0420   GLUCOSE 111 05/29/2014 1321   GLUCOSE 162* 04/03/2014 0420   BUN 19.2 05/29/2014 1321   BUN 8 04/03/2014 0420   CREATININE  1.0 05/29/2014 1321   CREATININE 0.71 04/03/2014 0420   CALCIUM 9.2 05/29/2014 1321   CALCIUM 8.6 04/03/2014 0420   PROT 6.6 05/29/2014 1321   PROT 6.5 04/02/2014 0804   ALBUMIN 3.7 05/29/2014 1321   ALBUMIN 3.4* 04/02/2014 0804   AST 10 05/29/2014 1321   AST 20 04/02/2014 0804   ALT 13 05/29/2014 1321   ALT 21 04/02/2014 0804   ALKPHOS 93 05/29/2014 1321   ALKPHOS 112 04/02/2014 0804   BILITOT 0.23 05/29/2014 1321   BILITOT 0.3 04/02/2014 0804   GFRNONAA >90 04/03/2014 0420   GFRAA >90 04/03/2014 0420    I No results found for: SPEP  Lab Results  Component Value Date   WBC 7.9 05/29/2014   NEUTROABS 5.7 05/29/2014   HGB 10.0* 05/29/2014   HCT 30.0* 05/29/2014   MCV 92.2 05/29/2014   PLT 282 05/29/2014      Chemistry      Component Value Date/Time   NA 142 05/29/2014 1321   NA 142 04/03/2014 0420   K 3.9 05/29/2014 1321   K 4.1 04/03/2014 0420   CL 106 04/03/2014 0420   CO2 24 05/29/2014 1321   CO2 19 04/03/2014 0420   BUN 19.2 05/29/2014 1321   BUN 8 04/03/2014 0420   CREATININE 1.0 05/29/2014 1321   CREATININE 0.71 04/03/2014 0420      Component Value Date/Time   CALCIUM 9.2 05/29/2014 1321   CALCIUM 8.6 04/03/2014 0420   ALKPHOS 93 05/29/2014 1321   ALKPHOS 112 04/02/2014 0804   AST 10 05/29/2014 1321   AST 20 04/02/2014 0804   ALT  13 05/29/2014 1321   ALT 21 04/02/2014 0804   BILITOT 0.23 05/29/2014 1321   BILITOT 0.3 04/02/2014 0804       No results found for: LABCA2  No components found for: LABCA125  No results for input(s): INR in the last 168 hours.  Urinalysis    Component Value Date/Time   COLORURINE YELLOW 12/25/2012 2225   APPEARANCEUR CLOUDY* 12/25/2012 2225   LABSPEC 1.027 12/25/2012 2225   PHURINE 5.0 12/25/2012 2225   GLUCOSEU NEGATIVE 12/25/2012 2225   HGBUR LARGE* 12/25/2012 2225   BILIRUBINUR NEGATIVE 12/25/2012 2225   KETONESUR NEGATIVE 12/25/2012 2225   PROTEINUR NEGATIVE 12/25/2012 2225   UROBILINOGEN 0.2  12/25/2012 2225   NITRITE NEGATIVE 12/25/2012 2225   LEUKOCYTESUR TRACE* 12/25/2012 2225    STUDIES: Most recent echocardiogram on 03/21/14 showed an ejection fraction of 66%  ASSESSMENT: 53 y.o. BRCA negative [place] woman status post left breast biopsy 02/22/2014 for a clinical T2/T3 NX, stage II or 3 invasive ductal carcinoma, grade 3, estrogen and progesterone receptor negative, with an MIB-1 of 83%, and HER-2 amplified with a signals a ratio of 2.07and a copy number per cell of 4.75  (1) biopsy of an additional area of enhancement in the left breast 03/08/2014 showed ductal carcinoma in situ, estrogen and progesterone receptor negative.  (2) status post bilateral mastectomies with left sentinel lymph node sampling 04/02/2014, showing:  (a) on the right, benign breast tissue including a single negative lymph node  (b) On  the left, a  pT1c pN0, stage IA invasive ductal carcinoma, grade 3, with negative margins  (3) adjuvant chemotherapy started 05/08/2014, to consist of carboplatin and docetaxel given every 3 weeks x6, together with trastuzumab   (4) trastuzumab to be continued to complete a year. Most recent echocardiogram 03/21/2014 showed a normal ejection fraction  (5) reconstruction to follow after completion of chemotherapy  (6) chest wall pain  (7) chest wall pain: The patient quit smoking 03/30/2014  PLAN: [name] is doing well today. The labs were reviewed in detail and were stable. She has treatment related anemia with a hgb of 10.0, but beside fatigue the patient is asymptomatic. We will proceed with day 1, cycle 2 of carboplatin, docetaxel, and trastuzumab today.   We discussed her diarrhea, and I have encouraged her to give the imodium a try. She is staying well hydrated but was likely not eating as frequently, especially in public, for fear of diarrhea.   [name] will return next week for her nadir visit. I have completed a POF to fill out her schedule through the end of  the year. Her next echocardiogram will be due in early January. She understands and agrees with this plan. She knows the goal of treatment in her case is cure. She has been encouraged to call with any issues that might arise before her next visit here.   Marcelino Duster, NP   05/29/2014 2:45 PM

## 2014-05-29 NOTE — Patient Instructions (Signed)
[facility] Discharge Instructions for Patients Receiving Chemotherapy  Today you received the following chemotherapy agents Herceptin, Carboplatin, Taxotere.  To help prevent nausea and vomiting after your treatment, we encourage you to take your nausea medication as directed.   If you develop nausea and vomiting that is not controlled by your nausea medication, call the clinic.   BELOW ARE SYMPTOMS THAT SHOULD BE REPORTED IMMEDIATELY:  *FEVER GREATER THAN 100.5 F  *CHILLS WITH OR WITHOUT FEVER  NAUSEA AND VOMITING THAT IS NOT CONTROLLED WITH YOUR NAUSEA MEDICATION  *UNUSUAL SHORTNESS OF BREATH  *UNUSUAL BRUISING OR BLEEDING  TENDERNESS IN MOUTH AND THROAT WITH OR WITHOUT PRESENCE OF ULCERS  *URINARY PROBLEMS  *BOWEL PROBLEMS  UNUSUAL RASH Items with * indicate a potential emergency and should be followed up as soon as possible.  Feel free to call the clinic you have any questions or concerns. The clinic phone number is [phone].

## 2014-05-30 ENCOUNTER — Telehealth: Payer: Self-pay | Admitting: Nurse Practitioner

## 2014-05-30 ENCOUNTER — Ambulatory Visit (HOSPITAL_BASED_OUTPATIENT_CLINIC_OR_DEPARTMENT_OTHER): Payer: BC Managed Care – PPO

## 2014-05-30 DIAGNOSIS — C50812 Malignant neoplasm of overlapping sites of left female breast: Secondary | ICD-10-CM

## 2014-05-30 DIAGNOSIS — C50412 Malignant neoplasm of upper-outer quadrant of left female breast: Secondary | ICD-10-CM

## 2014-05-30 DIAGNOSIS — Z5189 Encounter for other specified aftercare: Secondary | ICD-10-CM

## 2014-05-30 MED ORDER — PEGFILGRASTIM INJECTION 6 MG/0.6ML ~~LOC~~
6.0000 mg | PREFILLED_SYRINGE | Freq: Once | SUBCUTANEOUS | Status: AC
  Administered 2014-05-30: 6 mg via SUBCUTANEOUS
  Filled 2014-05-30: qty 0.6

## 2014-05-30 NOTE — Telephone Encounter (Signed)
, °

## 2014-05-30 NOTE — Patient Instructions (Signed)
Pegfilgrastim injection What is this medicine? PEGFILGRASTIM (peg fil GRA stim) is a long-acting granulocyte colony-stimulating factor that stimulates the growth of neutrophils, a type of white blood cell important in the body's fight against infection. It is used to reduce the incidence of fever and infection in patients with certain types of cancer who are receiving chemotherapy that affects the bone marrow. This medicine may be used for other purposes; ask your health care provider or pharmacist if you have questions. COMMON BRAND NAME(S): Neulasta What should I tell my health care provider before I take this medicine? They need to know if you have any of these conditions: -latex allergy -ongoing radiation therapy -sickle cell disease -skin reactions to acrylic adhesives (On-Body Injector only) -an unusual or allergic reaction to pegfilgrastim, filgrastim, other medicines, foods, dyes, or preservatives -pregnant or trying to get pregnant -breast-feeding How should I use this medicine? This medicine is for injection under the skin. If you get this medicine at home, you will be taught how to prepare and give the pre-filled syringe or how to use the On-body Injector. Refer to the patient Instructions for Use for detailed instructions. Use exactly as directed. Take your medicine at regular intervals. Do not take your medicine more often than directed. It is important that you put your used needles and syringes in a special sharps container. Do not put them in a trash can. If you do not have a sharps container, call your pharmacist or healthcare provider to get one. Talk to your pediatrician regarding the use of this medicine in children. Special care may be needed. Overdosage: If you think you have taken too much of this medicine contact a poison control center or emergency room at once. NOTE: This medicine is only for you. Do not share this medicine with others. What if I miss a dose? It is  important not to miss your dose. Call your doctor or health care professional if you miss your dose. If you miss a dose due to an On-body Injector failure or leakage, a new dose should be administered as soon as possible using a single prefilled syringe for manual use. What may interact with this medicine? Interactions have not been studied. Give your health care provider a list of all the medicines, herbs, non-prescription drugs, or dietary supplements you use. Also tell them if you smoke, drink alcohol, or use illegal drugs. Some items may interact with your medicine. This list may not describe all possible interactions. Give your health care provider a list of all the medicines, herbs, non-prescription drugs, or dietary supplements you use. Also tell them if you smoke, drink alcohol, or use illegal drugs. Some items may interact with your medicine. What should I watch for while using this medicine? You may need blood work done while you are taking this medicine. If you are going to need a MRI, CT scan, or other procedure, tell your doctor that you are using this medicine (On-Body Injector only). What side effects may I notice from receiving this medicine? Side effects that you should report to your doctor or health care professional as soon as possible: -allergic reactions like skin rash, itching or hives, swelling of the face, lips, or tongue -dizziness -fever -pain, redness, or irritation at site where injected -pinpoint red spots on the skin -shortness of breath or breathing problems -stomach or side pain, or pain at the shoulder -swelling -tiredness -trouble passing urine Side effects that usually do not require medical attention (report to your doctor   or health care professional if they continue or are bothersome): -bone pain -muscle pain This list may not describe all possible side effects. Call your doctor for medical advice about side effects. You may report side effects to FDA at  1-800-FDA-1088. Where should I keep my medicine? Keep out of the reach of children. Store pre-filled syringes in a refrigerator between 2 and 8 degrees C (36 and 46 degrees F). Do not freeze. Keep in carton to protect from light. Throw away this medicine if it is left out of the refrigerator for more than 48 hours. Throw away any unused medicine after the expiration date. NOTE: This sheet is a summary. It may not cover all possible information. If you have questions about this medicine, talk to your doctor, pharmacist, or health care provider.  2015, Elsevier/Gold Standard. (2013-10-05 16:14:05)  

## 2014-05-31 ENCOUNTER — Telehealth: Payer: Self-pay | Admitting: *Deleted

## 2014-05-31 NOTE — Telephone Encounter (Signed)
Per staff message and POF I have scheduled appts. Advised scheduler of appts. JMW  

## 2014-06-05 ENCOUNTER — Ambulatory Visit (HOSPITAL_BASED_OUTPATIENT_CLINIC_OR_DEPARTMENT_OTHER): Payer: BC Managed Care – PPO | Admitting: Nurse Practitioner

## 2014-06-05 ENCOUNTER — Encounter: Payer: Self-pay | Admitting: Nurse Practitioner

## 2014-06-05 ENCOUNTER — Other Ambulatory Visit (HOSPITAL_BASED_OUTPATIENT_CLINIC_OR_DEPARTMENT_OTHER): Payer: BC Managed Care – PPO

## 2014-06-05 VITALS — BP 103/73 | HR 88 | Temp 98.2°F | Resp 18 | Ht 66.5 in | Wt 159.4 lb

## 2014-06-05 DIAGNOSIS — C50419 Malignant neoplasm of upper-outer quadrant of unspecified female breast: Secondary | ICD-10-CM

## 2014-06-05 DIAGNOSIS — C50812 Malignant neoplasm of overlapping sites of left female breast: Secondary | ICD-10-CM

## 2014-06-05 DIAGNOSIS — C50412 Malignant neoplasm of upper-outer quadrant of left female breast: Secondary | ICD-10-CM

## 2014-06-05 DIAGNOSIS — K1231 Oral mucositis (ulcerative) due to antineoplastic therapy: Secondary | ICD-10-CM | POA: Insufficient documentation

## 2014-06-05 DIAGNOSIS — R197 Diarrhea, unspecified: Secondary | ICD-10-CM

## 2014-06-05 LAB — CBC WITH DIFFERENTIAL/PLATELET
BASO%: 0.6 % (ref 0.0–2.0)
Basophils Absolute: 0 10*3/uL (ref 0.0–0.1)
EOS ABS: 0 10*3/uL (ref 0.0–0.5)
EOS%: 0.2 % (ref 0.0–7.0)
HCT: 34.8 % (ref 34.8–46.6)
HGB: 11.4 g/dL — ABNORMAL LOW (ref 11.6–15.9)
LYMPH%: 26 % (ref 14.0–49.7)
MCH: 30 pg (ref 25.1–34.0)
MCHC: 32.9 g/dL (ref 31.5–36.0)
MCV: 91.2 fL (ref 79.5–101.0)
MONO#: 1.2 10*3/uL — AB (ref 0.1–0.9)
MONO%: 19.3 % — ABNORMAL HIGH (ref 0.0–14.0)
NEUT%: 53.9 % (ref 38.4–76.8)
NEUTROS ABS: 3.3 10*3/uL (ref 1.5–6.5)
PLATELETS: 262 10*3/uL (ref 145–400)
RBC: 3.81 10*6/uL (ref 3.70–5.45)
RDW: 13.8 % (ref 11.2–14.5)
WBC: 6.1 10*3/uL (ref 3.9–10.3)
lymph#: 1.6 10*3/uL (ref 0.9–3.3)

## 2014-06-05 LAB — COMPREHENSIVE METABOLIC PANEL (CC13)
ALT: 13 U/L (ref 0–55)
ANION GAP: 8 meq/L (ref 3–11)
AST: 12 U/L (ref 5–34)
Albumin: 4.1 g/dL (ref 3.5–5.0)
Alkaline Phosphatase: 117 U/L (ref 40–150)
BILIRUBIN TOTAL: 0.57 mg/dL (ref 0.20–1.20)
BUN: 10.8 mg/dL (ref 7.0–26.0)
CO2: 25 meq/L (ref 22–29)
CREATININE: 1 mg/dL (ref 0.6–1.1)
Calcium: 9.3 mg/dL (ref 8.4–10.4)
Chloride: 103 mEq/L (ref 98–109)
GLUCOSE: 93 mg/dL (ref 70–140)
Potassium: 3.9 mEq/L (ref 3.5–5.1)
Sodium: 136 mEq/L (ref 136–145)
Total Protein: 6.8 g/dL (ref 6.4–8.3)

## 2014-06-05 MED ORDER — HYDROCODONE-ACETAMINOPHEN 5-325 MG PO TABS: 1.0000 | ORAL_TABLET | Freq: Four times a day (QID) | ORAL | Status: DC | PRN

## 2014-06-05 MED ORDER — ONDANSETRON HCL 8 MG PO TABS: 8.0000 mg | ORAL_TABLET | Freq: Two times a day (BID) | ORAL | Status: DC

## 2014-06-05 MED ORDER — NYSTATIN 100000 UNIT/ML MT SUSP: 5.0000 mL | Freq: Four times a day (QID) | OROMUCOSAL | Status: DC

## 2014-06-05 MED ORDER — CHOLESTYRAMINE 4 G PO PACK: 4.0000 g | PACK | Freq: Three times a day (TID) | ORAL | Status: DC | PRN

## 2014-06-05 NOTE — Progress Notes (Signed)
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 She brought it to her primary physician's attention him a and on 02/21/2014 she underwent bilateral diagnostic mammography with tomography at [facility]. The breast composition was category B. In the left breast at the 3:00 position there was an irregular mass associated with skin retraction. Ultrasound performed on the same day confirmed a 1.1 cm irregular solid mass in the left breast at the 3:00 position. There were no abnormalities by sonography in the left axilla or in the superior portion of the breast, which is for the patient experiences some tenderness.  Biopsy of the left breast area in question 02/22/2014 showed ([id number]) and invasive ductal carcinoma, grade 3, estrogen and progesterone receptor negative, with an MIB-1 of 83%, and HER-2 amplified, the signals ratio being 2.07, and a copy number per cell 4.75.  On 02/28/2014 the patient underwent bilateral breast MRI, which showed in the posterior third of the left breast at the 3:00 position an irregular spiculated mass measuring 1.8 cm. The left breast was unremarkable and there were no abnormal appearing lymph nodes.  The patient's subsequent history is as  detailed below  INTERVAL HISTORY: Stacy Fernandez returns today for follow up of her breast cancer accompanied by her husband [name]. Today is day 8 cycle 2 of her cyclophosphamide, docetaxel and trastuzumab, which she receives every 21 days, with Neulasta on day 2.  This week she is having increased lower back and hip pain. She has been taking her hydrocodone-ibuprofen for her right toe and chest wall pain, but it does not completely cover the back pain. She has had some mild nausea, but this is managed with with her PRN antiemetics. What bothers her the most is the recurrent diarrhea after treatment. She took imodium a few times, but this seems to stop her up. As soon as that is out of her system, she has diarrhea again. It tends to happen after eating, and often times she sees the remnants of pills and other whole parcels of her meal. She has shaved her head and purchased a few wigs to try. She is excited that she has gone the duration of chemo without wanting a cigarette.  REVIEW OF SYSTEMS:  Stacy Fernandez denies fevers, or chills. Her appetite continues to be decreased, but she is staying well hydrated. Her mouth soreness has returned. She denies peripheral neuropathy symptoms. She has some fatigue, but this is manageable.  A detailed review of systems is otherwise noncontributory.   PAST MEDICAL HISTORY: Past Medical History  Diagnosis Date  . Hypothyroidism   . Shortness of breath     2D ECHO, 04/20/2012 - EF >55%, normal; EXERCISE STRESS TEST, 04/20/2012 - normal, EKG negative for ischemia, no ECG changes  . COPD (chronic obstructive pulmonary disease)   . Emphysema   . Hematuria     cause unknow-  saw Urologist  . Hot flashes   . Anginal pain     Dr Claiborne Billings cardiologist- test normal.  " think it is from breast cancer"  . Brain aneurysm      Dr Jannifer Franklin neurologist is monitoring  . Anxiety     Due to situation, CA breast  . Chronic bronchitis     "I've had it quite a few times" (04/02/2014)  . GERD  (gastroesophageal reflux disease)   . Migraine     "treated w/botox for them" (04/02/2014)    PAST SURGICAL HISTORY: Past Surgical History  Procedure Laterality Date  . Cholecystectomy    . Appendectomy    . Laparoscopic nissen fundoplication  10/6268    for GERD- not a problem  . Video bronchoscopy Bilateral 01/16/2013    Procedure: VIDEO BRONCHOSCOPY WITHOUT FLUORO;  Surgeon: [name], MD;  Location: [facility];  Service: Cardiopulmonary;  Laterality: Bilateral;  . Mastectomy complete / simple w/ sentinel node biopsy Left 04/02/2014    axillary SLN  . Mastectomy complete / simple Right 04/02/2014    PROPHYLACTIC  . Portacath placement Right 04/02/2014  . Breast biopsy Left 11/2013 X 3  . Total abdominal hysterectomy    . Cardiac catheterization  2014  . Simple mastectomy with axillary sentinel node biopsy Bilateral 04/02/2014    Procedure: LEFT TOTAL MASTECTOMY WITH LEFT AXILLARY SENTINEL NODE BIOPSY, RIGHT PROPHYLACTIC MASTETCTOMY;  Surgeon: [name], MD;  Location: [place];  Service: General;  Laterality: Bilateral;  . Portacath placement N/A 04/02/2014    Procedure: INSERTION PORT-A-CATH;  Surgeon: [name], MD;  Location[facility] OR;  Service: General;  Laterality: N/A;    FAMILY HISTORY Family History  Problem Relation Age of Onset  . Heart failure Mother   . Colon cancer Father 4    stomach cancer also in 24s  . Throat cancer Brother 31    smoker  . Heart attack Maternal Grandmother   . Colon cancer Paternal Grandmother 15  . Cancer Paternal Grandfather     kidney and bladder  . Throat cancer Brother 37    throat cancer, smoker  . Ovarian cancer Sister 1    ovarian cancer at 5, colorectal cancer at 41  . Breast cancer Paternal Aunt 30  . Ovarian cancer Other 1    niece with ovarian cancer   the patient's father died at the age of 55 from metastatic stomach cancer the patient's father's mother died from colon cancer at the age of 18. The patient's mother  died at the age of 17. The patient had 2 brothers and 2 sisters. One brother died at the age of 52 from throat cancer metastatic to the lung. He was a smoker. A second brother Was diagnosed at age 33 with throat cancer metastatic to lung. He has been given 90 days to live" is not looking good". One sister died from colon cancer metastatic to bone. One sister died of a drug overdose. There is no history of breast or bearing cancer in the family to the patient's knowledge.  GYNECOLOGIC HISTORY:  No LMP recorded. Patient has had a hysterectomy. Menarche age 17, first live birth age 39, the patient is [place] P4. She underwent hysterectomy with bilateral salpingo-oophorectomy approximately 1990. She took hormone replacement for approximately 2 years.  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    ADVANCED DIRECTIVES: Not  in place   HEALTH MAINTENANCE: History  Substance Use Topics  . Smoking status: Former Smoker -- 0.00 packs/day for 37 years    Quit date: 03/29/2014  . Smokeless tobacco: Never Used     Comment: uses e-cig 01/03/13, starting using Chantix 11/13/13  . Alcohol Use: No     Colonoscopy: August 2015  PAP: May 2013  Bone density:  Lipid panel:  Allergies  Allergen Reactions  . Contrast Media [Iodinated Diagnostic Agents] Anaphylaxis    Heart stops and breathing stops    Current Outpatient Prescriptions  Medication Sig Dispense Refill  . buPROPion (WELLBUTRIN SR) 150 MG 12 hr tablet Take 150 mg by mouth daily. Pt takes 150 mg in the evening.    Marland Kitchen buPROPion (WELLBUTRIN XL) 150 MG 24 hr tablet Take 300 mg by mouth daily.     Marland Kitchen dexamethasone (DECADRON) 4 MG tablet Take 2 tablets (8 mg total) by mouth 2 (two) times daily. Start the day before Taxotere. Then again the day after chemo for 3 days. 30 tablet 1  . gabapentin (NEURONTIN) 300 MG capsule Take 1 capsule  (300 mg total) by mouth 4 (four) times daily. 120 capsule 6  . ipratropium-albuterol (DUONEB) 0.5-2.5 (3) MG/3ML SOLN Take 3 mLs by nebulization every 6 (six) hours as needed.    Marland Kitchen levothyroxine (SYNTHROID, LEVOTHROID) 75 MCG tablet Take 75 mcg by mouth daily before breakfast.    . lidocaine-prilocaine (EMLA) cream Apply 1 application topically as needed. Apply over port area 1-2 hours before chemo and cover with plastic wrap 30 g 0  . lidocaine-prilocaine (EMLA) cream Apply 1 application topically as needed. Apply over port area 1-2 hours before chemo and cover with plastic wrap 30 g 0  . LORazepam (ATIVAN) 0.5 MG tablet Take 1 tablet (0.5 mg total) by mouth at bedtime as needed (Nausea or vomiting). 30 tablet 0  . montelukast (SINGULAIR) 10 MG tablet Take 10 mg by mouth at bedtime.     . ondansetron (ZOFRAN) 8 MG tablet Take 1 tablet (8 mg total) by mouth 2 (two) times daily. 60 tablet 1  . rizatriptan (MAXALT-MLT) 10 MG disintegrating tablet Take 1 tablet (10 mg total) by mouth as needed for migraine. May repeat in 2 hours if needed 15 tablet 11  . tiZANidine (ZANAFLEX) 2 MG tablet Take 1 tablet (2 mg total) by mouth 2 (two) times daily as needed for muscle spasms. 60 tablet 11  . tobramycin-dexamethasone (TOBRADEX) ophthalmic solution Place 1 drop into both eyes every 4 (four) hours while awake. 5 mL 0  . traZODone (DESYREL) 50 MG tablet Take 1 tablet (50 mg total) by mouth at bedtime. 30 tablet 4  . verapamil (CALAN-SR) 120 MG CR tablet TAKE 1 TABLET BY MOUTH AT BEDTIME ( MAX 30 DAY SUPPLY PER INSURANCE) 30 tablet 11  . atorvastatin (LIPITOR) 10 MG tablet Take 10 mg by mouth daily.    . cholestyramine (QUESTRAN) 4 G packet Take 1 packet (4 g total) by mouth 3 (three) times daily as needed. 60 each 12  . HYDROcodone-acetaminophen (NORCO/VICODIN) 5-325 MG per tablet Take 1 tablet by mouth every 6 (six) hours as needed for moderate pain. 60 tablet 0  . nystatin (MYCOSTATIN) 100000 UNIT/ML  suspension Take 5 mLs (500,000 Units total) by mouth 4 (four) times daily. Swish and swallow 240 mL 1  . PROAIR HFA 108 (90 BASE) MCG/ACT inhaler Inhale 2 puffs into the lungs every 4 (four) hours as needed for wheezing or shortness of  breath.     . prochlorperazine (COMPAZINE) 10 MG tablet Take 1 tablet (10 mg total) by mouth every 6 (six) hours as needed (Nausea or vomiting). 30 tablet 1  . SUMAtriptan (IMITREX) 6 MG/0.5ML SOLN injection Inject 6 mg into the skin every 2 (two) hours as needed for migraine or headache. May repeat in 2 hours if headache persists or recurs. Max 2 doses in 24 hours     Current Facility-Administered Medications  Medication Dose Route Frequency Provider Last Rate Last Dose  . botulinum toxin Type A (BOTOX) injection 150 Units  150 Units Intramuscular Once Marcial Pacas, MD        OBJECTIVE: Middle-aged white woman who was tearful during today's visit Filed Vitals:   06/05/14 1506  BP: 103/73  Pulse: 88  Temp: 98.2 F (36.8 C)  Resp: 18     Body mass index is 25.35 kg/(m^2).    ECOG FS:2 - Symptomatic, <50% confined to bed Vitals - 1 value per visit 04/03/2014   Sclerae unicteric, pupils equal and reactive Oropharynx clear, tongue red and mildly swollen, grade 1 mucositis No cervical or supraclavicular adenopathy Lungs no rales or rhonchi Heart regular rate and rhythm Abd soft, nontender, positive bowel sounds MSK no focal spinal tenderness, no upper extremity lymphedema Neuro: nonfocal, well oriented, appropriate affect Breasts: deferred  LAB RESULTS:  CMP     Component Value Date/Time   NA 136 06/05/2014 1434   NA 142 04/03/2014 0420   K 3.9 06/05/2014 1434   K 4.1 04/03/2014 0420   CL 106 04/03/2014 0420   CO2 25 06/05/2014 1434   CO2 19 04/03/2014 0420   GLUCOSE 93 06/05/2014 1434   GLUCOSE 162* 04/03/2014 0420   BUN 10.8 06/05/2014 1434   BUN 8 04/03/2014 0420   CREATININE 1.0 06/05/2014 1434   CREATININE 0.71 04/03/2014 0420   CALCIUM 9.3  06/05/2014 1434   CALCIUM 8.6 04/03/2014 0420   PROT 6.8 06/05/2014 1434   PROT 6.5 04/02/2014 0804   ALBUMIN 4.1 06/05/2014 1434   ALBUMIN 3.4* 04/02/2014 0804   AST 12 06/05/2014 1434   AST 20 04/02/2014 0804   ALT 13 06/05/2014 1434   ALT 21 04/02/2014 0804   ALKPHOS 117 06/05/2014 1434   ALKPHOS 112 04/02/2014 0804   BILITOT 0.57 06/05/2014 1434   BILITOT 0.3 04/02/2014 0804   GFRNONAA >90 04/03/2014 0420   GFRAA >90 04/03/2014 0420    I No results found for: SPEP  Lab Results  Component Value Date   WBC 6.1 06/05/2014   NEUTROABS 3.3 06/05/2014   HGB 11.4* 06/05/2014   HCT 34.8 06/05/2014   MCV 91.2 06/05/2014   PLT 262 06/05/2014      Chemistry      Component Value Date/Time   NA 136 06/05/2014 1434   NA 142 04/03/2014 0420   K 3.9 06/05/2014 1434   K 4.1 04/03/2014 0420   CL 106 04/03/2014 0420   CO2 25 06/05/2014 1434   CO2 19 04/03/2014 0420   BUN 10.8 06/05/2014 1434   BUN 8 04/03/2014 0420   CREATININE 1.0 06/05/2014 1434   CREATININE 0.71 04/03/2014 0420      Component Value Date/Time   CALCIUM 9.3 06/05/2014 1434   CALCIUM 8.6 04/03/2014 0420   ALKPHOS 117 06/05/2014 1434   ALKPHOS 112 04/02/2014 0804   AST 12 06/05/2014 1434   AST 20 04/02/2014 0804   ALT 13 06/05/2014 1434   ALT 21 04/02/2014 0804   BILITOT  0.57 06/05/2014 1434   BILITOT 0.3 04/02/2014 0804       No results found for: LABCA2  No components found for: XVQMG867  No results for input(s): INR in the last 168 hours.  Urinalysis    Component Value Date/Time   COLORURINE YELLOW 12/25/2012 2225   APPEARANCEUR CLOUDY* 12/25/2012 2225   LABSPEC 1.027 12/25/2012 2225   PHURINE 5.0 12/25/2012 2225   GLUCOSEU NEGATIVE 12/25/2012 2225   HGBUR LARGE* 12/25/2012 2225   BILIRUBINUR NEGATIVE 12/25/2012 2225   KETONESUR NEGATIVE 12/25/2012 2225   PROTEINUR NEGATIVE 12/25/2012 2225   UROBILINOGEN 0.2 12/25/2012 2225   NITRITE NEGATIVE 12/25/2012 2225   LEUKOCYTESUR TRACE*  12/25/2012 2225    STUDIES: Most recent echocardiogram on 03/21/14 showed an ejection fraction of 66%  ASSESSMENT: 53 y.o. BRCA negative [place] woman status post left breast biopsy 02/22/2014 for a clinical T2/T3 NX, stage II or 3 invasive ductal carcinoma, grade 3, estrogen and progesterone receptor negative, with an MIB-1 of 83%, and HER-2 amplified with a signals a ratio of 2.07and a copy number per cell of 4.75  (1) biopsy of an additional area of enhancement in the left breast 03/08/2014 showed ductal carcinoma in situ, estrogen and progesterone receptor negative.  (2) status post bilateral mastectomies with left sentinel lymph node sampling 04/02/2014, showing:  (a) on the right, benign breast tissue including a single negative lymph node  (b) On  the left, a  pT1c pN0, stage IA invasive ductal carcinoma, grade 3, with negative margins  (3) adjuvant chemotherapy started 05/08/2014, to consist of carboplatin and docetaxel given every 3 weeks x6, together with trastuzumab   (4) trastuzumab to be continued to complete a year. Most recent echocardiogram 03/21/2014 showed a normal ejection fraction  (5) reconstruction to follow after completion of chemotherapy  (6) chest wall pain  (7) chest wall pain: The patient quit smoking 03/30/2014  PLAN: Stacy Fernandez is doing moderately well today. The labs were reviewed in detail and were stable. I have refilled her zofran and hydrocodone, switching the compound to hydrocodone-APAP vs. hyrdocodone-ibuprofen, per her request. For the diarrhea, I am prescribing her questran powder to use instead of the imodium. She has a history of IBS and may find this to be more effective. I also wrote for a nystatin suspension to be swished/swallowed as she has throat irritation along with tongue soreness.   Stacy Fernandez will return in 2 weeks for the start of cycle 3 of carboplatin, docetaxel, and trastuzumab. She understands and agrees with this plan. She knows the goal of  treatment in her case is cure. She has been encouraged to call with any issues that might arise before her next visit here.   Marcelino Duster, NP   06/05/2014 3:52 PM

## 2014-06-08 ENCOUNTER — Telehealth: Payer: Self-pay | Admitting: Nurse Practitioner

## 2014-06-08 NOTE — Telephone Encounter (Signed)
, °

## 2014-06-12 ENCOUNTER — Encounter (INDEPENDENT_AMBULATORY_CARE_PROVIDER_SITE_OTHER): Payer: Self-pay

## 2014-06-12 NOTE — Progress Notes (Signed)
Per Dr. Dalbert Batman No further Narcotics to be prescribed by our office.  Patient receiving narcotic pain relievers from multiple physicians.

## 2014-06-13 ENCOUNTER — Ambulatory Visit: Payer: BC Managed Care – PPO | Admitting: Neurology

## 2014-06-18 ENCOUNTER — Other Ambulatory Visit: Payer: Self-pay | Admitting: *Deleted

## 2014-06-18 DIAGNOSIS — C50412 Malignant neoplasm of upper-outer quadrant of left female breast: Secondary | ICD-10-CM

## 2014-06-19 ENCOUNTER — Encounter: Payer: Self-pay | Admitting: Nurse Practitioner

## 2014-06-19 ENCOUNTER — Other Ambulatory Visit (HOSPITAL_BASED_OUTPATIENT_CLINIC_OR_DEPARTMENT_OTHER): Payer: BC Managed Care – PPO

## 2014-06-19 ENCOUNTER — Other Ambulatory Visit: Payer: Self-pay | Admitting: Oncology

## 2014-06-19 ENCOUNTER — Telehealth: Payer: Self-pay | Admitting: Nurse Practitioner

## 2014-06-19 ENCOUNTER — Other Ambulatory Visit: Payer: Self-pay | Admitting: *Deleted

## 2014-06-19 ENCOUNTER — Ambulatory Visit (HOSPITAL_BASED_OUTPATIENT_CLINIC_OR_DEPARTMENT_OTHER): Payer: BC Managed Care – PPO | Admitting: Nurse Practitioner

## 2014-06-19 ENCOUNTER — Ambulatory Visit (HOSPITAL_BASED_OUTPATIENT_CLINIC_OR_DEPARTMENT_OTHER): Payer: BC Managed Care – PPO

## 2014-06-19 ENCOUNTER — Telehealth: Payer: Self-pay | Admitting: *Deleted

## 2014-06-19 VITALS — BP 104/60 | HR 84 | Temp 98.0°F | Resp 19 | Ht 66.0 in | Wt 164.9 lb

## 2014-06-19 DIAGNOSIS — C50419 Malignant neoplasm of upper-outer quadrant of unspecified female breast: Secondary | ICD-10-CM

## 2014-06-19 DIAGNOSIS — Z171 Estrogen receptor negative status [ER-]: Secondary | ICD-10-CM

## 2014-06-19 DIAGNOSIS — C50812 Malignant neoplasm of overlapping sites of left female breast: Secondary | ICD-10-CM

## 2014-06-19 DIAGNOSIS — C50412 Malignant neoplasm of upper-outer quadrant of left female breast: Secondary | ICD-10-CM

## 2014-06-19 DIAGNOSIS — R0789 Other chest pain: Secondary | ICD-10-CM

## 2014-06-19 DIAGNOSIS — Z5112 Encounter for antineoplastic immunotherapy: Secondary | ICD-10-CM

## 2014-06-19 DIAGNOSIS — Z5111 Encounter for antineoplastic chemotherapy: Secondary | ICD-10-CM

## 2014-06-19 LAB — CBC WITH DIFFERENTIAL/PLATELET
BASO%: 0.2 % (ref 0.0–2.0)
Basophils Absolute: 0 10*3/uL (ref 0.0–0.1)
EOS%: 0.2 % (ref 0.0–7.0)
Eosinophils Absolute: 0 10*3/uL (ref 0.0–0.5)
HEMATOCRIT: 30.6 % — AB (ref 34.8–46.6)
HGB: 10.1 g/dL — ABNORMAL LOW (ref 11.6–15.9)
LYMPH#: 1.1 10*3/uL (ref 0.9–3.3)
LYMPH%: 23.5 % (ref 14.0–49.7)
MCH: 31.2 pg (ref 25.1–34.0)
MCHC: 33 g/dL (ref 31.5–36.0)
MCV: 94.4 fL (ref 79.5–101.0)
MONO#: 0.5 10*3/uL (ref 0.1–0.9)
MONO%: 11.5 % (ref 0.0–14.0)
NEUT#: 3 10*3/uL (ref 1.5–6.5)
NEUT%: 64.6 % (ref 38.4–76.8)
Platelets: 175 10*3/uL (ref 145–400)
RBC: 3.24 10*6/uL — ABNORMAL LOW (ref 3.70–5.45)
RDW: 16.3 % — ABNORMAL HIGH (ref 11.2–14.5)
WBC: 4.7 10*3/uL (ref 3.9–10.3)

## 2014-06-19 LAB — COMPREHENSIVE METABOLIC PANEL (CC13)
ALT: 15 U/L (ref 0–55)
ANION GAP: 9 meq/L (ref 3–11)
AST: 13 U/L (ref 5–34)
Albumin: 3.9 g/dL (ref 3.5–5.0)
Alkaline Phosphatase: 83 U/L (ref 40–150)
BILIRUBIN TOTAL: 0.36 mg/dL (ref 0.20–1.20)
BUN: 17.3 mg/dL (ref 7.0–26.0)
CALCIUM: 9.6 mg/dL (ref 8.4–10.4)
CHLORIDE: 109 meq/L (ref 98–109)
CO2: 23 mEq/L (ref 22–29)
Creatinine: 0.9 mg/dL (ref 0.6–1.1)
Glucose: 82 mg/dl (ref 70–140)
Potassium: 4.1 mEq/L (ref 3.5–5.1)
SODIUM: 141 meq/L (ref 136–145)
TOTAL PROTEIN: 6.5 g/dL (ref 6.4–8.3)

## 2014-06-19 MED ORDER — DIPHENHYDRAMINE HCL 25 MG PO CAPS
ORAL_CAPSULE | ORAL | Status: AC
  Filled 2014-06-19: qty 1

## 2014-06-19 MED ORDER — TRASTUZUMAB CHEMO INJECTION 440 MG
6.0000 mg/kg | Freq: Once | INTRAVENOUS | Status: AC
  Administered 2014-06-19: 462 mg via INTRAVENOUS
  Filled 2014-06-19: qty 22

## 2014-06-19 MED ORDER — DEXAMETHASONE SODIUM PHOSPHATE 20 MG/5ML IJ SOLN
20.0000 mg | Freq: Once | INTRAMUSCULAR | Status: AC
  Administered 2014-06-19: 20 mg via INTRAVENOUS

## 2014-06-19 MED ORDER — DEXAMETHASONE SODIUM PHOSPHATE 20 MG/5ML IJ SOLN
INTRAMUSCULAR | Status: AC
  Filled 2014-06-19: qty 5

## 2014-06-19 MED ORDER — SODIUM CHLORIDE 0.9 % IV SOLN: Freq: Once | INTRAVENOUS | Status: AC

## 2014-06-19 MED ORDER — DIPHENHYDRAMINE HCL 25 MG PO CAPS
25.0000 mg | ORAL_CAPSULE | Freq: Once | ORAL | Status: AC
  Administered 2014-06-19: 25 mg via ORAL

## 2014-06-19 MED ORDER — SODIUM CHLORIDE 0.9 % IJ SOLN
10.0000 mL | INTRAMUSCULAR | Status: DC | PRN
  Administered 2014-06-19: 10 mL
  Filled 2014-06-19: qty 10

## 2014-06-19 MED ORDER — ACETAMINOPHEN 325 MG PO TABS
ORAL_TABLET | ORAL | Status: AC
  Filled 2014-06-19: qty 2

## 2014-06-19 MED ORDER — SODIUM CHLORIDE 0.9 % IJ SOLN
10.0000 mL | INTRAMUSCULAR | Status: DC | PRN
  Filled 2014-06-19: qty 10

## 2014-06-19 MED ORDER — SODIUM CHLORIDE 0.9 % IV SOLN
528.5000 mg | Freq: Once | INTRAVENOUS | Status: AC
  Administered 2014-06-19: 530 mg via INTRAVENOUS
  Filled 2014-06-19: qty 53

## 2014-06-19 MED ORDER — ONDANSETRON 16 MG/50ML IVPB (CHCC)
16.0000 mg | Freq: Once | INTRAVENOUS | Status: AC
  Administered 2014-06-19: 16 mg via INTRAVENOUS

## 2014-06-19 MED ORDER — DEXAMETHASONE 4 MG PO TABS: 8.0000 mg | ORAL_TABLET | Freq: Two times a day (BID) | ORAL | Status: DC

## 2014-06-19 MED ORDER — HEPARIN SOD (PORK) LOCK FLUSH 100 UNIT/ML IV SOLN
500.0000 [IU] | Freq: Once | INTRAVENOUS | Status: AC | PRN
  Administered 2014-06-19: 500 [IU]
  Filled 2014-06-19: qty 5

## 2014-06-19 MED ORDER — DOCETAXEL CHEMO INJECTION 160 MG/16ML
75.0000 mg/m2 | Freq: Once | INTRAVENOUS | Status: AC
  Administered 2014-06-19: 140 mg via INTRAVENOUS
  Filled 2014-06-19: qty 14

## 2014-06-19 MED ORDER — SODIUM CHLORIDE 0.9 % IV SOLN
Freq: Once | INTRAVENOUS | Status: AC
  Administered 2014-06-19: 11:00:00 via INTRAVENOUS

## 2014-06-19 MED ORDER — HEPARIN SOD (PORK) LOCK FLUSH 100 UNIT/ML IV SOLN
500.0000 [IU] | Freq: Once | INTRAVENOUS | Status: DC | PRN
  Filled 2014-06-19: qty 5

## 2014-06-19 MED ORDER — ACETAMINOPHEN 325 MG PO TABS
650.0000 mg | ORAL_TABLET | Freq: Once | ORAL | Status: AC
  Administered 2014-06-19: 650 mg via ORAL

## 2014-06-19 MED ORDER — ONDANSETRON 16 MG/50ML IVPB (CHCC)
INTRAVENOUS | Status: AC
  Filled 2014-06-19: qty 16

## 2014-06-19 NOTE — Telephone Encounter (Signed)
Per staff message and POF I have scheduled appts. Advised scheduler of appts. JMW  

## 2014-06-19 NOTE — Progress Notes (Signed)
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 She brought it to her primary physician's attention him a and on 02/21/2014 she underwent bilateral diagnostic mammography with tomography at [facility]. The breast composition was category B. In the left breast at the 3:00 position there was an irregular mass associated with skin retraction. Ultrasound performed on the same day confirmed a 1.1 cm irregular solid mass in the left breast at the 3:00 position. There were no abnormalities by sonography in the left axilla or in the superior portion of the breast, which is for the patient experiences some tenderness.  Biopsy of the left breast area in question 02/22/2014 showed ([id number]) and invasive ductal carcinoma, grade 3, estrogen and progesterone receptor negative, with an MIB-1 of 83%, and HER-2 amplified, the signals ratio being 2.07, and a copy number per cell 4.75.  On 02/28/2014 the patient underwent bilateral breast MRI, which showed in the posterior third of the left breast at the 3:00 position an irregular spiculated mass measuring 1.8 cm. The left breast was unremarkable and there were no abnormal appearing lymph nodes.  The patient's subsequent history is as  detailed below  INTERVAL HISTORY: Stacy Fernandez returns today for follow up of her breast cancer accompanied by her husband Stacy Fernandez. Today is day 1 cycle 3 of her cyclophosphamide, docetaxel and trastuzumab, which she receives every 21 days, with Neulasta on day 2.  The interval history is generally unremarkable. She has no new complaints to offer. Stacy Fernandez denies fevers or chills. Her nausea is managed well with PRN anti-emetics. She has had no more diarrhea since using the questran powder. She denies mouth sores or rashes. Her appetite has improved, though her sense of taste changes acutely after treatment. Her energy level waxes and wanes but this is manageable. She does not always sleep well because of back and joint pain. She denies peripheral neuropathy symptoms.    REVIEW OF SYSTEMS:  A detailed review of systems is otherwise noncontributory, except where noted above.   PAST MEDICAL HISTORY: Past Medical History  Diagnosis Date  . Hypothyroidism   . Shortness of breath     2D ECHO, 04/20/2012 - EF >55%, normal; EXERCISE STRESS TEST, 04/20/2012 - normal, EKG negative for ischemia, no ECG changes  . COPD (chronic obstructive pulmonary disease)   . Emphysema   . Hematuria     cause unknow- saw Urologist  . Hot flashes   . Anginal pain     Dr Claiborne Billings cardiologist- test normal.  " think it is from breast cancer"  . Brain aneurysm      Dr Jannifer Franklin neurologist is monitoring  . Anxiety     Due to situation, CA breast  . Chronic bronchitis     "I've had it quite a few times" (04/02/2014)  . GERD (gastroesophageal reflux disease)   . Migraine     "  treated w/botox for them" (04/02/2014)    PAST SURGICAL HISTORY: Past Surgical History  Procedure Laterality Date  . Cholecystectomy    . Appendectomy    . Laparoscopic nissen fundoplication  11/4625    for GERD- not a problem  . Video bronchoscopy Bilateral 01/16/2013    Procedure: VIDEO BRONCHOSCOPY WITHOUT FLUORO;  Surgeon: [name], MD;  Location:  [facility];  Service: Cardiopulmonary;  Laterality: Bilateral;  . Mastectomy complete / simple w/ sentinel node biopsy Left 04/02/2014    axillary SLN  . Mastectomy complete / simple Right 04/02/2014    PROPHYLACTIC  . Portacath placement Right 04/02/2014  . Breast biopsy Left 11/2013 X 3  . Total abdominal hysterectomy    . Cardiac catheterization  2014  . Simple mastectomy with axillary sentinel node biopsy Bilateral 04/02/2014    Procedure: LEFT TOTAL MASTECTOMY WITH LEFT AXILLARY SENTINEL NODE BIOPSY, RIGHT PROPHYLACTIC MASTETCTOMY;  Surgeon: [name], MD;  Location: [place];  Service: General;  Laterality: Bilateral;  . Portacath placement N/A 04/02/2014    Procedure: INSERTION PORT-A-CATH;  Surgeon: [name], MD;  Location: [facility] OR;  Service: General;  Laterality: N/A;    FAMILY HISTORY Family History  Problem Relation Age of Onset  . Heart failure Mother   . Colon cancer Father 30    stomach cancer also in 75s  . Throat cancer Brother 81    smoker  . Heart attack Maternal Grandmother   . Colon cancer Paternal Grandmother 86  . Cancer Paternal Grandfather     kidney and bladder  . Throat cancer Brother 37    throat cancer, smoker  . Ovarian cancer Sister 67    ovarian cancer at 9, colorectal cancer at 63  . Breast cancer Paternal Aunt 50  . Ovarian cancer Other 70    niece with ovarian cancer   the patient's father died at the age of 57 from metastatic stomach cancer the patient's father's mother died from colon cancer at the age of 68. The patient's mother died at the age of 44. The patient had 2 brothers and 2 sisters. One brother died at the age of 72 from throat cancer metastatic to the lung. He was a smoker. A second brother Was diagnosed at age 4 with throat cancer metastatic to lung. He has been given 90 days to live" is not looking good". One sister died from colon cancer metastatic to bone. One sister died of a drug overdose. There is no history of breast or  bearing cancer in the family to the patient's knowledge.  GYNECOLOGIC HISTORY:  No LMP recorded. Patient has had a hysterectomy. Menarche age 58, first live birth age 47, the patient is [place] P4. She underwent hysterectomy with bilateral salpingo-oophorectomy approximately 1990. She took hormone replacement for approximately 2 years.  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    ADVANCED DIRECTIVES: Not in place   HEALTH MAINTENANCE: History  Substance Use Topics  . Smoking status: Former Smoker -- 0.00 packs/day for 37 years    Quit date: 03/29/2014  . Smokeless tobacco: Never Used     Comment: uses e-cig 01/03/13, starting using Chantix 11/13/13  . Alcohol Use: No     Colonoscopy: August 2015  PAP: May 2013  Bone density:  Lipid panel:  Allergies  Allergen Reactions  . Contrast Media [Iodinated Diagnostic Agents] Anaphylaxis  Heart stops and breathing stops    Current Outpatient Prescriptions  Medication Sig Dispense Refill  . buPROPion (WELLBUTRIN XL) 150 MG 24 hr tablet Take 300 mg by mouth daily.     Marland Kitchen gabapentin (NEURONTIN) 300 MG capsule Take 1 capsule (300 mg total) by mouth 4 (four) times daily. 120 capsule 6  . HYDROcodone-acetaminophen (NORCO/VICODIN) 5-325 MG per tablet Take 1 tablet by mouth every 6 (six) hours as needed for moderate pain. 60 tablet 0  . ipratropium-albuterol (DUONEB) 0.5-2.5 (3) MG/3ML SOLN Take 3 mLs by nebulization every 6 (six) hours as needed.    Marland Kitchen levothyroxine (SYNTHROID, LEVOTHROID) 75 MCG tablet Take 75 mcg by mouth daily before breakfast.    . lidocaine-prilocaine (EMLA) cream Apply 1 application topically as needed. Apply over port area 1-2 hours before chemo and cover with plastic wrap 30 g 0  . LORazepam (ATIVAN) 0.5 MG tablet Take 1 tablet (0.5 mg total) by mouth at bedtime as needed (Nausea or vomiting). 30 tablet  0  . montelukast (SINGULAIR) 10 MG tablet Take 10 mg by mouth at bedtime.     . ondansetron (ZOFRAN) 8 MG tablet Take 1 tablet (8 mg total) by mouth 2 (two) times daily. 60 tablet 1  . prochlorperazine (COMPAZINE) 10 MG tablet Take 1 tablet (10 mg total) by mouth every 6 (six) hours as needed (Nausea or vomiting). 30 tablet 1  . rizatriptan (MAXALT-MLT) 10 MG disintegrating tablet Take 1 tablet (10 mg total) by mouth as needed for migraine. May repeat in 2 hours if needed 15 tablet 11  . tiZANidine (ZANAFLEX) 2 MG tablet Take 1 tablet (2 mg total) by mouth 2 (two) times daily as needed for muscle spasms. 60 tablet 11  . tobramycin-dexamethasone (TOBRADEX) ophthalmic solution Place 1 drop into both eyes every 4 (four) hours while awake. 5 mL 0  . traZODone (DESYREL) 50 MG tablet Take 1 tablet (50 mg total) by mouth at bedtime. 30 tablet 4  . verapamil (CALAN-SR) 120 MG CR tablet TAKE 1 TABLET BY MOUTH AT BEDTIME ( MAX 30 DAY SUPPLY PER INSURANCE) 30 tablet 11  . buPROPion (WELLBUTRIN SR) 150 MG 12 hr tablet Take 150 mg by mouth daily. Pt takes 150 mg in the evening.    . cholestyramine (QUESTRAN) 4 G packet Take 1 packet (4 g total) by mouth 3 (three) times daily as needed. (Patient not taking: Reported on 06/19/2014) 60 each 12  . dexamethasone (DECADRON) 4 MG tablet Take 2 tablets (8 mg total) by mouth 2 (two) times daily. Start the day before Taxotere. Then again the day after chemo for 3 days. (Patient not taking: Reported on 06/19/2014) 30 tablet 1  . nystatin (MYCOSTATIN) 100000 UNIT/ML suspension Take 5 mLs (500,000 Units total) by mouth 4 (four) times daily. Swish and swallow (Patient not taking: Reported on 06/19/2014) 240 mL 1  . PROAIR HFA 108 (90 BASE) MCG/ACT inhaler Inhale 2 puffs into the lungs every 4 (four) hours as needed for wheezing or shortness of breath.     . SUMAtriptan (IMITREX) 6 MG/0.5ML SOLN injection Inject 6 mg into the skin every 2 (two) hours as needed for migraine or  headache. May repeat in 2 hours if headache persists or recurs. Max 2 doses in 24 hours     Current Facility-Administered Medications  Medication Dose Route Frequency Provider Last Rate Last Dose  . botulinum toxin Type A (BOTOX) injection 150 Units  150 Units Intramuscular Once Marcial Pacas, MD  OBJECTIVE: Middle-aged white woman who was tearful during today's visit Filed Vitals:   06/19/14 0935  BP: 104/60  Pulse: 84  Temp: 98 F (36.7 C)  Resp: 19     Body mass index is 26.63 kg/(m^2).    ECOG FS:2 - Symptomatic, <50% confined to bed  Skin: warm, dry  HEENT: sclerae anicteric, conjunctivae pink, oropharynx clear. No thrush or mucositis.  Lymph Nodes: No cervical or supraclavicular lymphadenopathy  Lungs: clear to auscultation bilaterally, no rales, wheezes, or rhonci  Heart: regular rate and rhythm  Abdomen: round, soft, non tender, positive bowel sounds  Musculoskeletal: No focal spinal tenderness, no peripheral edema  Neuro: non focal, well oriented, positive affect  Breasts: deferred  LAB RESULTS:  CMP     Component Value Date/Time   NA 136 06/05/2014 1434   NA 142 04/03/2014 0420   K 3.9 06/05/2014 1434   K 4.1 04/03/2014 0420   CL 106 04/03/2014 0420   CO2 25 06/05/2014 1434   CO2 19 04/03/2014 0420   GLUCOSE 93 06/05/2014 1434   GLUCOSE 162* 04/03/2014 0420   BUN 10.8 06/05/2014 1434   BUN 8 04/03/2014 0420   CREATININE 1.0 06/05/2014 1434   CREATININE 0.71 04/03/2014 0420   CALCIUM 9.3 06/05/2014 1434   CALCIUM 8.6 04/03/2014 0420   PROT 6.8 06/05/2014 1434   PROT 6.5 04/02/2014 0804   ALBUMIN 4.1 06/05/2014 1434   ALBUMIN 3.4* 04/02/2014 0804   AST 12 06/05/2014 1434   AST 20 04/02/2014 0804   ALT 13 06/05/2014 1434   ALT 21 04/02/2014 0804   ALKPHOS 117 06/05/2014 1434   ALKPHOS 112 04/02/2014 0804   BILITOT 0.57 06/05/2014 1434   BILITOT 0.3 04/02/2014 0804   GFRNONAA >90 04/03/2014 0420   GFRAA >90 04/03/2014 0420    I No results found  for: SPEP  Lab Results  Component Value Date   WBC 4.7 06/19/2014   NEUTROABS 3.0 06/19/2014   HGB 10.1* 06/19/2014   HCT 30.6* 06/19/2014   MCV 94.4 06/19/2014   PLT 175 06/19/2014      Chemistry      Component Value Date/Time   NA 136 06/05/2014 1434   NA 142 04/03/2014 0420   K 3.9 06/05/2014 1434   K 4.1 04/03/2014 0420   CL 106 04/03/2014 0420   CO2 25 06/05/2014 1434   CO2 19 04/03/2014 0420   BUN 10.8 06/05/2014 1434   BUN 8 04/03/2014 0420   CREATININE 1.0 06/05/2014 1434   CREATININE 0.71 04/03/2014 0420      Component Value Date/Time   CALCIUM 9.3 06/05/2014 1434   CALCIUM 8.6 04/03/2014 0420   ALKPHOS 117 06/05/2014 1434   ALKPHOS 112 04/02/2014 0804   AST 12 06/05/2014 1434   AST 20 04/02/2014 0804   ALT 13 06/05/2014 1434   ALT 21 04/02/2014 0804   BILITOT 0.57 06/05/2014 1434   BILITOT 0.3 04/02/2014 0804       No results found for: LABCA2  No components found for: LABCA125  No results for input(s): INR in the last 168 hours.  Urinalysis    Component Value Date/Time   COLORURINE YELLOW 12/25/2012 2225   APPEARANCEUR CLOUDY* 12/25/2012 2225   LABSPEC 1.027 12/25/2012 2225   PHURINE 5.0 12/25/2012 2225   GLUCOSEU NEGATIVE 12/25/2012 2225   HGBUR LARGE* 12/25/2012 2225   BILIRUBINUR NEGATIVE 12/25/2012 2225   KETONESUR NEGATIVE 12/25/2012 2225   PROTEINUR NEGATIVE 12/25/2012 2225   UROBILINOGEN 0.2 12/25/2012 2225  NITRITE NEGATIVE 12/25/2012 2225   LEUKOCYTESUR TRACE* 12/25/2012 2225    STUDIES: Most recent echocardiogram on 03/21/14 showed an ejection fraction of 66%  ASSESSMENT: 53 y.o. BRCA negative [place] woman status post left breast biopsy 02/22/2014 for a clinical T2/T3 NX, stage II or 3 invasive ductal carcinoma, grade 3, estrogen and progesterone receptor negative, with an MIB-1 of 83%, and HER-2 amplified with a signals a ratio of 2.07and a copy number per cell of 4.75  (1) biopsy of an additional area of enhancement in  the left breast 03/08/2014 showed ductal carcinoma in situ, estrogen and progesterone receptor negative.  (2) status post bilateral mastectomies with left sentinel lymph node sampling 04/02/2014, showing:  (a) on the right, benign breast tissue including a single negative lymph node  (b) On  the left, a  pT1c pN0, stage IA invasive ductal carcinoma, grade 3, with negative margins  (3) adjuvant chemotherapy started 05/08/2014, to consist of carboplatin and docetaxel given every 3 weeks x6, together with trastuzumab   (4) trastuzumab to be continued to complete a year. Most recent echocardiogram 03/21/2014 showed a normal ejection fraction  (5) reconstruction to follow after completion of chemotherapy  (6) chest wall pain  (7) chest wall pain: The patient quit smoking 03/30/2014  PLAN: Stacy Fernandez is doing very well today. The labs were reviewed in detail and were completely stable. We will proceed with cycle 3 of carboplatin, docetaxel, and trastuzumab today. She will receive neulasta tomorrow.   Stacy Fernandez will return next week for labs and a nadir visit. Her next echocardiogram will be due in early January 2016. I have put in orders to fill out the rest of her schedule through February. She understands and agrees with this plan. She knows the goal of treatment in her case is cure. She has been encouraged to call with any issues that might arise before her next visit here.  Marcelino Duster, NP   06/19/2014 9:50 AM

## 2014-06-19 NOTE — Patient Instructions (Signed)
[place] Discharge Instructions for Patients Receiving Chemotherapy  Today you received the following chemotherapy agents: Taxotere, Carboplatin, and Herceptin.  To help prevent nausea and vomiting after your treatment, we encourage you to take your nausea medication as prescribed.   If you develop nausea and vomiting that is not controlled by your nausea medication, call the clinic.   BELOW ARE SYMPTOMS THAT SHOULD BE REPORTED IMMEDIATELY:  *FEVER GREATER THAN 100.5 F  *CHILLS WITH OR WITHOUT FEVER  NAUSEA AND VOMITING THAT IS NOT CONTROLLED WITH YOUR NAUSEA MEDICATION  *UNUSUAL SHORTNESS OF BREATH  *UNUSUAL BRUISING OR BLEEDING  TENDERNESS IN MOUTH AND THROAT WITH OR WITHOUT PRESENCE OF ULCERS  *URINARY PROBLEMS  *BOWEL PROBLEMS  UNUSUAL RASH Items with * indicate a potential emergency and should be followed up as soon as possible.  Feel free to call the clinic you have any questions or concerns. The clinic phone number is [phone].

## 2014-06-20 ENCOUNTER — Ambulatory Visit (HOSPITAL_BASED_OUTPATIENT_CLINIC_OR_DEPARTMENT_OTHER): Payer: BC Managed Care – PPO

## 2014-06-20 DIAGNOSIS — C50412 Malignant neoplasm of upper-outer quadrant of left female breast: Secondary | ICD-10-CM

## 2014-06-20 DIAGNOSIS — Z5189 Encounter for other specified aftercare: Secondary | ICD-10-CM

## 2014-06-20 DIAGNOSIS — C50812 Malignant neoplasm of overlapping sites of left female breast: Secondary | ICD-10-CM

## 2014-06-20 MED ORDER — PEGFILGRASTIM INJECTION 6 MG/0.6ML ~~LOC~~
6.0000 mg | PREFILLED_SYRINGE | Freq: Once | SUBCUTANEOUS | Status: AC
  Administered 2014-06-20: 6 mg via SUBCUTANEOUS
  Filled 2014-06-20: qty 0.6

## 2014-06-20 NOTE — Patient Instructions (Signed)
Pegfilgrastim injection What is this medicine? PEGFILGRASTIM (peg fil GRA stim) is a long-acting granulocyte colony-stimulating factor that stimulates the growth of neutrophils, a type of white blood cell important in the body's fight against infection. It is used to reduce the incidence of fever and infection in patients with certain types of cancer who are receiving chemotherapy that affects the bone marrow. This medicine may be used for other purposes; ask your health care provider or pharmacist if you have questions. COMMON BRAND NAME(S): Neulasta What should I tell my health care provider before I take this medicine? They need to know if you have any of these conditions: -latex allergy -ongoing radiation therapy -sickle cell disease -skin reactions to acrylic adhesives (On-Body Injector only) -an unusual or allergic reaction to pegfilgrastim, filgrastim, other medicines, foods, dyes, or preservatives -pregnant or trying to get pregnant -breast-feeding How should I use this medicine? This medicine is for injection under the skin. If you get this medicine at home, you will be taught how to prepare and give the pre-filled syringe or how to use the On-body Injector. Refer to the patient Instructions for Use for detailed instructions. Use exactly as directed. Take your medicine at regular intervals. Do not take your medicine more often than directed. It is important that you put your used needles and syringes in a special sharps container. Do not put them in a trash can. If you do not have a sharps container, call your pharmacist or healthcare provider to get one. Talk to your pediatrician regarding the use of this medicine in children. Special care may be needed. Overdosage: If you think you have taken too much of this medicine contact a poison control center or emergency room at once. NOTE: This medicine is only for you. Do not share this medicine with others. What if I miss a dose? It is  important not to miss your dose. Call your doctor or health care professional if you miss your dose. If you miss a dose due to an On-body Injector failure or leakage, a new dose should be administered as soon as possible using a single prefilled syringe for manual use. What may interact with this medicine? Interactions have not been studied. Give your health care provider a list of all the medicines, herbs, non-prescription drugs, or dietary supplements you use. Also tell them if you smoke, drink alcohol, or use illegal drugs. Some items may interact with your medicine. This list may not describe all possible interactions. Give your health care provider a list of all the medicines, herbs, non-prescription drugs, or dietary supplements you use. Also tell them if you smoke, drink alcohol, or use illegal drugs. Some items may interact with your medicine. What should I watch for while using this medicine? You may need blood work done while you are taking this medicine. If you are going to need a MRI, CT scan, or other procedure, tell your doctor that you are using this medicine (On-Body Injector only). What side effects may I notice from receiving this medicine? Side effects that you should report to your doctor or health care professional as soon as possible: -allergic reactions like skin rash, itching or hives, swelling of the face, lips, or tongue -dizziness -fever -pain, redness, or irritation at site where injected -pinpoint red spots on the skin -shortness of breath or breathing problems -stomach or side pain, or pain at the shoulder -swelling -tiredness -trouble passing urine Side effects that usually do not require medical attention (report to your doctor   or health care professional if they continue or are bothersome): -bone pain -muscle pain This list may not describe all possible side effects. Call your doctor for medical advice about side effects. You may report side effects to FDA at  1-800-FDA-1088. Where should I keep my medicine? Keep out of the reach of children. Store pre-filled syringes in a refrigerator between 2 and 8 degrees C (36 and 46 degrees F). Do not freeze. Keep in carton to protect from light. Throw away this medicine if it is left out of the refrigerator for more than 48 hours. Throw away any unused medicine after the expiration date. NOTE: This sheet is a summary. It may not cover all possible information. If you have questions about this medicine, talk to your doctor, pharmacist, or health care provider.  2015, Elsevier/Gold Standard. (2013-10-05 16:14:05)  

## 2014-06-26 ENCOUNTER — Other Ambulatory Visit (HOSPITAL_BASED_OUTPATIENT_CLINIC_OR_DEPARTMENT_OTHER): Payer: BC Managed Care – PPO

## 2014-06-26 ENCOUNTER — Encounter: Payer: Self-pay | Admitting: Oncology

## 2014-06-26 ENCOUNTER — Ambulatory Visit (HOSPITAL_BASED_OUTPATIENT_CLINIC_OR_DEPARTMENT_OTHER): Payer: BC Managed Care – PPO | Admitting: Nurse Practitioner

## 2014-06-26 VITALS — BP 112/58 | HR 110 | Temp 98.0°F | Resp 18 | Ht 66.0 in | Wt 160.6 lb

## 2014-06-26 DIAGNOSIS — C50419 Malignant neoplasm of upper-outer quadrant of unspecified female breast: Secondary | ICD-10-CM

## 2014-06-26 DIAGNOSIS — C50812 Malignant neoplasm of overlapping sites of left female breast: Secondary | ICD-10-CM

## 2014-06-26 DIAGNOSIS — R198 Other specified symptoms and signs involving the digestive system and abdomen: Secondary | ICD-10-CM

## 2014-06-26 DIAGNOSIS — G4700 Insomnia, unspecified: Secondary | ICD-10-CM

## 2014-06-26 DIAGNOSIS — R0789 Other chest pain: Secondary | ICD-10-CM

## 2014-06-26 DIAGNOSIS — Z171 Estrogen receptor negative status [ER-]: Secondary | ICD-10-CM

## 2014-06-26 DIAGNOSIS — C50412 Malignant neoplasm of upper-outer quadrant of left female breast: Secondary | ICD-10-CM

## 2014-06-26 DIAGNOSIS — K5900 Constipation, unspecified: Secondary | ICD-10-CM

## 2014-06-26 DIAGNOSIS — R300 Dysuria: Secondary | ICD-10-CM

## 2014-06-26 LAB — COMPREHENSIVE METABOLIC PANEL (CC13)
ALK PHOS: 98 U/L (ref 40–150)
ALT: 13 U/L (ref 0–55)
AST: 11 U/L (ref 5–34)
Albumin: 4.1 g/dL (ref 3.5–5.0)
Anion Gap: 12 mEq/L — ABNORMAL HIGH (ref 3–11)
BUN: 14 mg/dL (ref 7.0–26.0)
CO2: 21 mEq/L — ABNORMAL LOW (ref 22–29)
CREATININE: 1 mg/dL (ref 0.6–1.1)
Calcium: 9.4 mg/dL (ref 8.4–10.4)
Chloride: 106 mEq/L (ref 98–109)
EGFR: 63 mL/min/{1.73_m2} — ABNORMAL LOW (ref >90)
Glucose: 122 mg/dl (ref 70–140)
Potassium: 3.5 mEq/L (ref 3.5–5.1)
Sodium: 139 mEq/L (ref 136–145)
Total Bilirubin: 0.59 mg/dL (ref 0.20–1.20)
Total Protein: 6.7 g/dL (ref 6.4–8.3)

## 2014-06-26 LAB — CBC WITH DIFFERENTIAL/PLATELET
BASO%: 0.6 % (ref 0.0–2.0)
BASOS ABS: 0 10*3/uL (ref 0.0–0.1)
EOS%: 0.2 % (ref 0.0–7.0)
Eosinophils Absolute: 0 10*3/uL (ref 0.0–0.5)
HCT: 34.1 % — ABNORMAL LOW (ref 34.8–46.6)
HEMOGLOBIN: 11.4 g/dL — AB (ref 11.6–15.9)
LYMPH%: 27.1 % (ref 14.0–49.7)
MCH: 31.1 pg (ref 25.1–34.0)
MCHC: 33.4 g/dL (ref 31.5–36.0)
MCV: 93.1 fL (ref 79.5–101.0)
MONO#: 0.8 10*3/uL (ref 0.1–0.9)
MONO%: 14.9 % — AB (ref 0.0–14.0)
NEUT#: 3.2 10*3/uL (ref 1.5–6.5)
NEUT%: 57.2 % (ref 38.4–76.8)
Platelets: 214 10*3/uL (ref 145–400)
RBC: 3.66 10*6/uL — ABNORMAL LOW (ref 3.70–5.45)
RDW: 16.9 % — AB (ref 11.2–14.5)
WBC: 5.5 10*3/uL (ref 3.9–10.3)
lymph#: 1.5 10*3/uL (ref 0.9–3.3)

## 2014-06-26 MED ORDER — TRAZODONE HCL 50 MG PO TABS: 50.0000 mg | ORAL_TABLET | Freq: Every day | ORAL | Status: DC

## 2014-06-26 NOTE — Progress Notes (Signed)
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 She brought it to her primary physician's attention him a and on 02/21/2014 she underwent bilateral diagnostic mammography with tomography at [facility]. The breast composition was category B. In the left breast at the 3:00 position there was an irregular mass associated with skin retraction. Ultrasound performed on the same day confirmed a 1.1 cm irregular solid mass in the left breast at the 3:00 position. There were no abnormalities by sonography in the left axilla or in the superior portion of the breast, which is for the patient experiences some tenderness.  Biopsy of the left breast area in question 02/22/2014 showed ([id number]) and invasive ductal carcinoma, grade 3, estrogen and progesterone receptor negative, with an MIB-1 of 83%, and HER-2 amplified, the signals ratio being 2.07, and a copy number per cell 4.75.  On 02/28/2014 the patient underwent bilateral breast MRI, which showed in the posterior third of the left breast at the 3:00 position an irregular spiculated mass measuring 1.8 cm. The left breast was unremarkable and there were no abnormal appearing lymph nodes.  The patient's subsequent history is as  detailed below  INTERVAL HISTORY: Stacy Fernandez returns today for follow up of her breast cancer accompanied by her husband [name]. Today is day 8 cycle 3 of her cyclophosphamide, docetaxel and trastuzumab, which she receives every 21 days, with Neulasta on day 2.  Stacy Fernandez states that this past cycle of chemo was the worst of them all. Her main complaint is bloating and irregular bowel habits. On some days she has frequent stools immediately after eating. On other days she feels constipated and bloated. She is afraid to take any stool softeners or miralax for fear of runny bowels, and the imodium and questran powder stop her up after a single dose. She has a history of a laparoscopic nissen fundoplication and since then has always had sensitive bowels. She has some burning with urination, but she states she just feels as if her labia are "raw" and thus feels pain when in contact with the urine. She has been applying vaseline to this area. She has had UTIs in the past and this is not the same sensation. Her appetite is only fair and she can not taste anything. She has mild queeziness, but overall this is managed with there PRN anti-emetics. She is not sleeping with the 12m of trazodone any more. She feels on edge and is snapping at her family members due to not sleeping well the night prior. She has a history of migraines, but has been experiencing frontal headaches this past week. She was advised not to take excedrin or tylenol because of her history of a brain aneurysm. She has imitrex and maxalt that was prescribed for her migraines. She missed her botox injection that was due 2 weeks ago for her migraines.  She  is using the magic mouthwash for her mucosal irritation, but there are no sores present. She has no numbness or tingling to her fingertips or toes.   REVIEW OF SYSTEMS:  A detailed review of systems is otherwise negative, except where noted above.   PAST MEDICAL HISTORY: Past Medical History  Diagnosis  Date  . Hypothyroidism   . Shortness of breath     2D ECHO, 04/20/2012 - EF >55%, normal; EXERCISE STRESS TEST, 04/20/2012 - normal, EKG negative for ischemia, no ECG changes  . COPD (chronic obstructive pulmonary disease)   . Emphysema   . Hematuria     cause unknow- saw Urologist  . Hot flashes   . Anginal pain     Dr Claiborne Billings cardiologist- test normal.  " think it is from breast cancer"  . Brain aneurysm      Dr Jannifer Franklin neurologist is monitoring  . Anxiety     Due to situation, CA breast  . Chronic bronchitis     "I've had it quite a few times" (04/02/2014)  . GERD (gastroesophageal reflux disease)   . Migraine     "treated w/botox for them" (04/02/2014)    PAST SURGICAL HISTORY: Past Surgical History  Procedure Laterality Date  . Cholecystectomy    . Appendectomy    . Laparoscopic nissen fundoplication  12/107    for GERD- not a problem  . Video bronchoscopy Bilateral 01/16/2013    Procedure: VIDEO BRONCHOSCOPY WITHOUT FLUORO;  Surgeon: [name], MD;  Location: [facility];  Service: Cardiopulmonary;  Laterality: Bilateral;  . Mastectomy complete / simple w/ sentinel node biopsy Left 04/02/2014    axillary SLN  . Mastectomy complete / simple Right 04/02/2014    PROPHYLACTIC  . Portacath placement Right 04/02/2014  . Breast biopsy Left 11/2013 X 3  . Total abdominal hysterectomy    . Cardiac catheterization  2014  . Simple mastectomy with axillary sentinel node biopsy Bilateral 04/02/2014    Procedure: LEFT TOTAL MASTECTOMY WITH LEFT AXILLARY SENTINEL NODE BIOPSY, RIGHT PROPHYLACTIC MASTETCTOMY;  Surgeon: [name], MD;  Location: [facility];  Service: General;  Laterality: Bilateral;  . Portacath placement N/A 04/02/2014    Procedure: INSERTION PORT-A-CATH;  Surgeon: [name], MD;  Location: [facility] OR;  Service: General;  Laterality: N/A;    FAMILY HISTORY Family History  Problem Relation Age of Onset  . Heart failure Mother   . Colon cancer Father 23    stomach cancer  also in 53s  . Throat cancer Brother 15    smoker  . Heart attack Maternal Grandmother   . Colon cancer Paternal Grandmother 64  . Cancer Paternal Grandfather     kidney and bladder  . Throat cancer Brother 37    throat cancer, smoker  . Ovarian cancer Sister 81    ovarian cancer at 64, colorectal cancer at 70  . Breast cancer Paternal Aunt 71  . Ovarian cancer Other 6    niece with ovarian cancer   the patient's father died at the age of 60 from metastatic stomach cancer the patient's father's mother died from colon cancer at the age of 19. The patient's mother died at the age of 80. The patient had 2 brothers and 2 sisters. One brother died at the age of 46 from throat cancer metastatic to the lung. He was a smoker. A second brother Was diagnosed at age 29 with throat cancer metastatic to lung. He has been given 90 days to live" is not looking  good". One sister died from colon cancer metastatic to bone. One sister died of a drug overdose. There is no history of breast or bearing cancer in the family to the patient's knowledge.  GYNECOLOGIC HISTORY:  No LMP recorded. Patient has had a hysterectomy. Menarche age 58, first live birth age 54, the patient is [place] P4. She underwent hysterectomy with bilateral salpingo-oophorectomy approximately 1990. She took hormone replacement for approximately 2 years.  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    ADVANCED DIRECTIVES: Not in place   HEALTH MAINTENANCE: History  Substance Use Topics  . Smoking status: Former Smoker -- 0.00 packs/day for 37 years    Quit date: 03/29/2014  . Smokeless tobacco: Never Used     Comment: uses e-cig 01/03/13, starting using Chantix 11/13/13  . Alcohol Use: No     Colonoscopy: August 2015  PAP: May 2013  Bone density:  Lipid panel:  Allergies  Allergen Reactions  . Contrast Media  [Iodinated Diagnostic Agents] Anaphylaxis    Heart stops and breathing stops    Current Outpatient Prescriptions  Medication Sig Dispense Refill  . buPROPion (WELLBUTRIN SR) 150 MG 12 hr tablet Take 150 mg by mouth daily. Pt takes 150 mg in the evening.    Marland Kitchen buPROPion (WELLBUTRIN XL) 150 MG 24 hr tablet Take 300 mg by mouth daily.     . cholestyramine (QUESTRAN) 4 G packet Take 1 packet (4 g total) by mouth 3 (three) times daily as needed. 60 each 12  . dexamethasone (DECADRON) 4 MG tablet Take 2 tablets (8 mg total) by mouth 2 (two) times daily. Start the day before Taxotere. Then again the day after chemo for 3 days. 30 tablet 1  . gabapentin (NEURONTIN) 300 MG capsule Take 1 capsule (300 mg total) by mouth 4 (four) times daily. 120 capsule 6  . HYDROcodone-acetaminophen (NORCO/VICODIN) 5-325 MG per tablet Take 1 tablet by mouth every 6 (six) hours as needed for moderate pain. 60 tablet 0  . levothyroxine (SYNTHROID, LEVOTHROID) 75 MCG tablet Take 75 mcg by mouth daily before breakfast.    . lidocaine-prilocaine (EMLA) cream Apply 1 application topically as needed. Apply over port area 1-2 hours before chemo and cover with plastic wrap 30 g 0  . LORazepam (ATIVAN) 0.5 MG tablet Take 1 tablet (0.5 mg total) by mouth at bedtime as needed (Nausea or vomiting). 30 tablet 0  . montelukast (SINGULAIR) 10 MG tablet Take 10 mg by mouth at bedtime.     Marland Kitchen nystatin (MYCOSTATIN) 100000 UNIT/ML suspension Take 5 mLs (500,000 Units total) by mouth 4 (four) times daily. Swish and swallow 240 mL 1  . ondansetron (ZOFRAN) 8 MG tablet Take 1 tablet (8 mg total) by mouth 2 (two) times daily. 60 tablet 1  . rizatriptan (MAXALT-MLT) 10 MG disintegrating tablet Take 1 tablet (10 mg total) by mouth as needed for migraine. May repeat in 2 hours if needed 15 tablet 11  . SUMAtriptan (IMITREX) 6 MG/0.5ML SOLN injection Inject 6 mg into the skin every 2 (two) hours as needed for migraine or headache. May repeat in 2 hours  if headache persists or recurs. Max 2 doses in 24 hours    . tiZANidine (ZANAFLEX) 2 MG tablet Take 1 tablet (2 mg total) by mouth 2 (two) times daily as needed for muscle spasms. 60 tablet 11  . tobramycin-dexamethasone (TOBRADEX)  ophthalmic solution Place 1 drop into both eyes every 4 (four) hours while awake. 5 mL 0  . traZODone (DESYREL) 50 MG tablet Take 1 tablet (50 mg total) by mouth at bedtime. 30 tablet 4  . verapamil (CALAN-SR) 120 MG CR tablet TAKE 1 TABLET BY MOUTH AT BEDTIME ( MAX 30 DAY SUPPLY PER INSURANCE) 30 tablet 11  . ipratropium-albuterol (DUONEB) 0.5-2.5 (3) MG/3ML SOLN Take 3 mLs by nebulization every 6 (six) hours as needed.    Marland Kitchen PROAIR HFA 108 (90 BASE) MCG/ACT inhaler Inhale 2 puffs into the lungs every 4 (four) hours as needed for wheezing or shortness of breath.     . prochlorperazine (COMPAZINE) 10 MG tablet Take 1 tablet (10 mg total) by mouth every 6 (six) hours as needed (Nausea or vomiting). (Patient not taking: Reported on 06/26/2014) 30 tablet 1   Current Facility-Administered Medications  Medication Dose Route Frequency Provider Last Rate Last Dose  . botulinum toxin Type A (BOTOX) injection 150 Units  150 Units Intramuscular Once Marcial Pacas, MD        OBJECTIVE: Middle-aged white woman who was tearful during today's visit Filed Vitals:   06/26/14 1348  BP: 112/58  Pulse: 110  Temp: 98 F (36.7 C)  Resp: 18     Body mass index is 25.93 kg/(m^2).    ECOG FS:2 - Symptomatic, <50% confined to bed  .Sclerae unicteric, pupils equal and reactive Oropharynx clear and moist-- no thrush No cervical or supraclavicular adenopathy Lungs no rales or rhonchi Heart regular rate and rhythm Abd soft, nontender, positive bowel sounds MSK no focal spinal tenderness, no upper extremity lymphedema Neuro: nonfocal, well oriented, appropriate affect Breasts: deferred  LAB RESULTS:  CMP     Component Value Date/Time   NA 139 06/26/2014 1335   NA 142 04/03/2014 0420     K 3.5 06/26/2014 1335   K 4.1 04/03/2014 0420   CL 106 04/03/2014 0420   CO2 21* 06/26/2014 1335   CO2 19 04/03/2014 0420   GLUCOSE 122 06/26/2014 1335   GLUCOSE 162* 04/03/2014 0420   BUN 14.0 06/26/2014 1335   BUN 8 04/03/2014 0420   CREATININE 1.0 06/26/2014 1335   CREATININE 0.71 04/03/2014 0420   CALCIUM 9.4 06/26/2014 1335   CALCIUM 8.6 04/03/2014 0420   PROT 6.7 06/26/2014 1335   PROT 6.5 04/02/2014 0804   ALBUMIN 4.1 06/26/2014 1335   ALBUMIN 3.4* 04/02/2014 0804   AST 11 06/26/2014 1335   AST 20 04/02/2014 0804   ALT 13 06/26/2014 1335   ALT 21 04/02/2014 0804   ALKPHOS 98 06/26/2014 1335   ALKPHOS 112 04/02/2014 0804   BILITOT 0.59 06/26/2014 1335   BILITOT 0.3 04/02/2014 0804   GFRNONAA >90 04/03/2014 0420   GFRAA >90 04/03/2014 0420    I No results found for: SPEP  Lab Results  Component Value Date   WBC 5.5 06/26/2014   NEUTROABS 3.2 06/26/2014   HGB 11.4* 06/26/2014   HCT 34.1* 06/26/2014   MCV 93.1 06/26/2014   PLT 214 06/26/2014      Chemistry      Component Value Date/Time   NA 139 06/26/2014 1335   NA 142 04/03/2014 0420   K 3.5 06/26/2014 1335   K 4.1 04/03/2014 0420   CL 106 04/03/2014 0420   CO2 21* 06/26/2014 1335   CO2 19 04/03/2014 0420   BUN 14.0 06/26/2014 1335   BUN 8 04/03/2014 0420   CREATININE 1.0 06/26/2014 1335   CREATININE  0.71 04/03/2014 0420      Component Value Date/Time   CALCIUM 9.4 06/26/2014 1335   CALCIUM 8.6 04/03/2014 0420   ALKPHOS 98 06/26/2014 1335   ALKPHOS 112 04/02/2014 0804   AST 11 06/26/2014 1335   AST 20 04/02/2014 0804   ALT 13 06/26/2014 1335   ALT 21 04/02/2014 0804   BILITOT 0.59 06/26/2014 1335   BILITOT 0.3 04/02/2014 0804       No results found for: LABCA2  No components found for: LABCA125  No results for input(s): INR in the last 168 hours.  Urinalysis    Component Value Date/Time   COLORURINE YELLOW 12/25/2012 2225   APPEARANCEUR CLOUDY* 12/25/2012 2225   LABSPEC 1.027  12/25/2012 2225   PHURINE 5.0 12/25/2012 2225   GLUCOSEU NEGATIVE 12/25/2012 2225   HGBUR LARGE* 12/25/2012 2225   BILIRUBINUR NEGATIVE 12/25/2012 2225   KETONESUR NEGATIVE 12/25/2012 2225   PROTEINUR NEGATIVE 12/25/2012 2225   UROBILINOGEN 0.2 12/25/2012 2225   NITRITE NEGATIVE 12/25/2012 2225   LEUKOCYTESUR TRACE* 12/25/2012 2225    STUDIES: Most recent echocardiogram on 03/21/14 showed an ejection fraction of 66%  ASSESSMENT: 53 y.o. BRCA negative [place] woman status post left breast biopsy 02/22/2014 for a clinical T2/T3 NX, stage II or 3 invasive ductal carcinoma, grade 3, estrogen and progesterone receptor negative, with an MIB-1 of 83%, and HER-2 amplified with a signals a ratio of 2.07and a copy number per cell of 4.75  (1) biopsy of an additional area of enhancement in the left breast 03/08/2014 showed ductal carcinoma in situ, estrogen and progesterone receptor negative.  (2) status post bilateral mastectomies with left sentinel lymph node sampling 04/02/2014, showing:  (a) on the right, benign breast tissue including a single negative lymph node  (b) On  the left, a  pT1c pN0, stage IA invasive ductal carcinoma, grade 3, with negative margins  (3) adjuvant chemotherapy started 05/08/2014, to consist of carboplatin and docetaxel given every 3 weeks x6, together with trastuzumab   (4) trastuzumab to be continued to complete a year. Most recent echocardiogram 03/21/2014 showed a normal ejection fraction  (5) reconstruction to follow after completion of chemotherapy  (6) chest wall pain  (7) Hx of smoking: The patient quit smoking 03/30/2014  PLAN: Stacy Fernandez does not feel well today. The labs were reviewed in detail and were entirely stable. I suggested she begin the probiotic florastor to hopefully restore her gastrointestinal flora. This may alleviate the alternation between constipation and diarrhea. She refuses to try any of the anti-diarrheal or constipation meds for fear  of making things worse. In either case she states she makes every attempt to stay well hydrated. I put in orders for a urinalysis and culture to be collected to demonstrate that the dysuria she is experiencing is indeed not a UTI. For her insomnia she will increase her dose to 118m trazodone QHS and a new prescription was sent for this drug today.   LJeani Hawkingwill return in 2 weeks for cycle 3 of carboplatin and docetaxel. Her next echocardiogram will be due in early January 2016. She understands and agrees with this plan. She knows the goal of treatment in her case is cure. She has been encouraged to call with any issues that might arise before her next visit here.   FMarcelino Duster NP   06/26/2014 5:36 PM

## 2014-06-26 NOTE — Progress Notes (Signed)
Faxed clinical information to [employer] @ [phone] to extend patient's disability

## 2014-06-27 ENCOUNTER — Encounter: Payer: Self-pay | Admitting: Nurse Practitioner

## 2014-06-27 ENCOUNTER — Encounter: Payer: Self-pay | Admitting: *Deleted

## 2014-06-27 DIAGNOSIS — R198 Other specified symptoms and signs involving the digestive system and abdomen: Secondary | ICD-10-CM | POA: Insufficient documentation

## 2014-06-27 NOTE — Progress Notes (Signed)
Faxed updated medical visit notes to disability [phone] on 06/27/14.

## 2014-06-28 ENCOUNTER — Encounter (HOSPITAL_COMMUNITY): Payer: Self-pay | Admitting: Cardiovascular Disease

## 2014-07-10 ENCOUNTER — Ambulatory Visit (HOSPITAL_BASED_OUTPATIENT_CLINIC_OR_DEPARTMENT_OTHER): Payer: BC Managed Care – PPO

## 2014-07-10 ENCOUNTER — Ambulatory Visit (HOSPITAL_BASED_OUTPATIENT_CLINIC_OR_DEPARTMENT_OTHER): Payer: BC Managed Care – PPO | Admitting: Nurse Practitioner

## 2014-07-10 ENCOUNTER — Other Ambulatory Visit: Payer: Self-pay | Admitting: Oncology

## 2014-07-10 ENCOUNTER — Other Ambulatory Visit: Payer: Self-pay | Admitting: *Deleted

## 2014-07-10 ENCOUNTER — Telehealth: Payer: Self-pay | Admitting: Nurse Practitioner

## 2014-07-10 VITALS — BP 91/51 | HR 88 | Temp 98.3°F | Resp 18 | Ht 66.0 in | Wt 170.4 lb

## 2014-07-10 DIAGNOSIS — R350 Frequency of micturition: Secondary | ICD-10-CM

## 2014-07-10 DIAGNOSIS — C50412 Malignant neoplasm of upper-outer quadrant of left female breast: Secondary | ICD-10-CM

## 2014-07-10 DIAGNOSIS — C50419 Malignant neoplasm of upper-outer quadrant of unspecified female breast: Secondary | ICD-10-CM

## 2014-07-10 DIAGNOSIS — C50812 Malignant neoplasm of overlapping sites of left female breast: Secondary | ICD-10-CM

## 2014-07-10 DIAGNOSIS — M545 Low back pain, unspecified: Secondary | ICD-10-CM

## 2014-07-10 DIAGNOSIS — R3915 Urgency of urination: Secondary | ICD-10-CM

## 2014-07-10 DIAGNOSIS — R300 Dysuria: Secondary | ICD-10-CM

## 2014-07-10 DIAGNOSIS — Z5111 Encounter for antineoplastic chemotherapy: Secondary | ICD-10-CM

## 2014-07-10 LAB — CBC WITH DIFFERENTIAL/PLATELET
BASO%: 0.6 % (ref 0.0–2.0)
BASOS ABS: 0 10*3/uL (ref 0.0–0.1)
EOS ABS: 0 10*3/uL (ref 0.0–0.5)
EOS%: 0.9 % (ref 0.0–7.0)
HEMATOCRIT: 27.5 % — AB (ref 34.8–46.6)
HGB: 9 g/dL — ABNORMAL LOW (ref 11.6–15.9)
LYMPH#: 0.7 10*3/uL — AB (ref 0.9–3.3)
LYMPH%: 19.4 % (ref 14.0–49.7)
MCH: 31.7 pg (ref 25.1–34.0)
MCHC: 32.7 g/dL (ref 31.5–36.0)
MCV: 96.8 fL (ref 79.5–101.0)
MONO#: 0.4 10*3/uL (ref 0.1–0.9)
MONO%: 12.6 % (ref 0.0–14.0)
NEUT%: 66.5 % (ref 38.4–76.8)
NEUTROS ABS: 2.3 10*3/uL (ref 1.5–6.5)
Platelets: 120 10*3/uL — ABNORMAL LOW (ref 145–400)
RBC: 2.84 10*6/uL — ABNORMAL LOW (ref 3.70–5.45)
RDW: 18.7 % — AB (ref 11.2–14.5)
WBC: 3.4 10*3/uL — ABNORMAL LOW (ref 3.9–10.3)
nRBC: 0 % (ref 0–0)

## 2014-07-10 LAB — URINALYSIS, MICROSCOPIC - CHCC
Bilirubin (Urine): NEGATIVE
Glucose: NEGATIVE mg/dL
KETONES: NEGATIVE mg/dL
Leukocyte Esterase: NEGATIVE
Nitrite: NEGATIVE
PH: 6 (ref 4.6–8.0)
Protein: NEGATIVE mg/dL
Specific Gravity, Urine: 1.01 (ref 1.003–1.035)
Urobilinogen, UR: 0.2 mg/dL (ref 0.2–1)

## 2014-07-10 MED ORDER — TRASTUZUMAB CHEMO INJECTION 440 MG
6.0000 mg/kg | Freq: Once | INTRAVENOUS | Status: AC
  Administered 2014-07-10: 462 mg via INTRAVENOUS
  Filled 2014-07-10: qty 22

## 2014-07-10 MED ORDER — ONDANSETRON 16 MG/50ML IVPB (CHCC)
16.0000 mg | Freq: Once | INTRAVENOUS | Status: AC
  Administered 2014-07-10: 16 mg via INTRAVENOUS

## 2014-07-10 MED ORDER — DIPHENHYDRAMINE HCL 25 MG PO CAPS
ORAL_CAPSULE | ORAL | Status: AC
Start: 2014-07-10 — End: 2014-07-10
  Filled 2014-07-10: qty 1

## 2014-07-10 MED ORDER — ONDANSETRON 16 MG/50ML IVPB (CHCC)
INTRAVENOUS | Status: AC
  Filled 2014-07-10: qty 16

## 2014-07-10 MED ORDER — DEXAMETHASONE SODIUM PHOSPHATE 20 MG/5ML IJ SOLN
INTRAMUSCULAR | Status: AC
  Filled 2014-07-10: qty 5

## 2014-07-10 MED ORDER — ACETAMINOPHEN 325 MG PO TABS
650.0000 mg | ORAL_TABLET | Freq: Once | ORAL | Status: AC
  Administered 2014-07-10: 650 mg via ORAL

## 2014-07-10 MED ORDER — DEXAMETHASONE SODIUM PHOSPHATE 20 MG/5ML IJ SOLN
20.0000 mg | Freq: Once | INTRAMUSCULAR | Status: AC
  Administered 2014-07-10: 20 mg via INTRAVENOUS

## 2014-07-10 MED ORDER — SODIUM CHLORIDE 0.9 % IV SOLN
Freq: Once | INTRAVENOUS | Status: AC
  Administered 2014-07-10: 12:00:00 via INTRAVENOUS

## 2014-07-10 MED ORDER — SODIUM CHLORIDE 0.9 % IJ SOLN
10.0000 mL | INTRAMUSCULAR | Status: DC | PRN
  Administered 2014-07-10: 10 mL
  Filled 2014-07-10: qty 10

## 2014-07-10 MED ORDER — TRAZODONE HCL 100 MG PO TABS: 100.0000 mg | ORAL_TABLET | Freq: Every day | ORAL | Status: DC

## 2014-07-10 MED ORDER — DIPHENHYDRAMINE HCL 25 MG PO CAPS
25.0000 mg | ORAL_CAPSULE | Freq: Once | ORAL | Status: AC
  Administered 2014-07-10: 25 mg via ORAL

## 2014-07-10 MED ORDER — HEPARIN SOD (PORK) LOCK FLUSH 100 UNIT/ML IV SOLN
500.0000 [IU] | Freq: Once | INTRAVENOUS | Status: AC | PRN
  Administered 2014-07-10: 500 [IU]
  Filled 2014-07-10: qty 5

## 2014-07-10 MED ORDER — DEXTROSE 5 % IV SOLN
75.0000 mg/m2 | Freq: Once | INTRAVENOUS | Status: AC
  Administered 2014-07-10: 140 mg via INTRAVENOUS
  Filled 2014-07-10: qty 14

## 2014-07-10 MED ORDER — LORAZEPAM 0.5 MG PO TABS: 0.5000 mg | ORAL_TABLET | Freq: Every evening | ORAL | Status: DC | PRN

## 2014-07-10 MED ORDER — ACETAMINOPHEN 325 MG PO TABS
ORAL_TABLET | ORAL | Status: AC
  Filled 2014-07-10: qty 2

## 2014-07-10 MED ORDER — HYDROCODONE-ACETAMINOPHEN 5-325 MG PO TABS: 1.0000 | ORAL_TABLET | Freq: Four times a day (QID) | ORAL | Status: DC | PRN

## 2014-07-10 MED ORDER — SODIUM CHLORIDE 0.9 % IV SOLN
530.0000 mg | Freq: Once | INTRAVENOUS | Status: AC
  Administered 2014-07-10: 530 mg via INTRAVENOUS
  Filled 2014-07-10: qty 53

## 2014-07-10 NOTE — Patient Instructions (Signed)
[facility] Discharge Instructions for Patients Receiving Chemotherapy  Today you received the following chemotherapy agents taxotere/carboplatin/herceptin  To help prevent nausea and vomiting after your treatment, we encourage you to take your nausea medication as directed   If you develop nausea and vomiting that is not controlled by your nausea medication, call the clinic.   BELOW ARE SYMPTOMS THAT SHOULD BE REPORTED IMMEDIATELY:  *FEVER GREATER THAN 100.5 F  *CHILLS WITH OR WITHOUT FEVER  NAUSEA AND VOMITING THAT IS NOT CONTROLLED WITH YOUR NAUSEA MEDICATION  *UNUSUAL SHORTNESS OF BREATH  *UNUSUAL BRUISING OR BLEEDING  TENDERNESS IN MOUTH AND THROAT WITH OR WITHOUT PRESENCE OF ULCERS  *URINARY PROBLEMS  *BOWEL PROBLEMS  UNUSUAL RASH Items with * indicate a potential emergency and should be followed up as soon as possible.  Feel free to call the clinic you have any questions or concerns. The clinic phone number is [phone].

## 2014-07-10 NOTE — Telephone Encounter (Signed)
per pof to sch pt appt-CS will sch CT-gave pt contrast

## 2014-07-10 NOTE — Progress Notes (Signed)
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 She brought it to her primary physician's attention him a and on 02/21/2014 she underwent bilateral diagnostic mammography with tomography at [facility]. The breast composition was category B. In the left breast at the 3:00 position there was an irregular mass associated with skin retraction. Ultrasound performed on the same day confirmed a 1.1 cm irregular solid mass in the left breast at the 3:00 position. There were no abnormalities by sonography in the left axilla or in the superior portion of the breast, which is for the patient experiences some tenderness.  Biopsy of the left breast area in question 02/22/2014 showed ([id number]) and invasive ductal carcinoma, grade 3, estrogen and progesterone receptor negative, with an MIB-1 of 83%, and HER-2 amplified, the signals ratio being 2.07, and a copy number per cell 4.75.  On 02/28/2014 the patient underwent bilateral breast MRI, which showed in the posterior third of the left breast at the 3:00 position an irregular spiculated mass measuring 1.8 cm. The left breast was unremarkable and there were no abnormal appearing lymph nodes.  The patient's subsequent history is as  detailed below  INTERVAL HISTORY: [name] returns today for follow up of her breast cancer accompanied by her husband [name]. Today is day 1 cycle 4 of her cyclophosphamide, docetaxel and trastuzumab, which she receives every 21 days, with Neulasta on day 2.  [name] denies fevers or chills. She had some mild nausea managed with her PRN antiemetics. The florastor has helped her gut tremendously, she is moving her bowels well now. She continues to have lower back pain and urinary frequency, but no burning now. She has left the urine sample that I requested last week. She is sleeping better with 166m trazodone and melatonin. She has minimal swelling her her ankles and hands that resolves with elevation. Her appetite is fair and she is staying well hydrated. She denies mouth sores, rashes, or peripheral neuropathy symptoms.   REVIEW OF SYSTEMS:  A detailed review of systems is otherwise negative, except where noted above.   PAST MEDICAL HISTORY: Past Medical History  Diagnosis Date  . Hypothyroidism   . Shortness of breath     2D ECHO, 04/20/2012 - EF >55%, normal; EXERCISE STRESS TEST, 04/20/2012 - normal, EKG negative for ischemia, no ECG changes  . COPD (chronic obstructive pulmonary disease)   . Emphysema   . Hematuria     cause unknow- saw Urologist  . Hot flashes   . Anginal pain     Dr KClaiborne Billingscardiologist- test normal.  " think it is from breast cancer"  . Brain aneurysm      Dr WJannifer Franklinneurologist is monitoring  . Anxiety     Due to situation, CA breast  . Chronic bronchitis     "I've had it quite a few times" (04/02/2014)  .  GERD (gastroesophageal reflux disease)   . Migraine     "treated w/botox for them" (04/02/2014)    PAST SURGICAL HISTORY: Past Surgical History  Procedure Laterality Date  . Cholecystectomy    . Appendectomy    . Laparoscopic nissen fundoplication  09/2000    for GERD- not a problem  . Video bronchoscopy Bilateral 01/16/2013    Procedure: VIDEO BRONCHOSCOPY  WITHOUT FLUORO;  Surgeon: Oretha Milch, MD;  Location: [facility] ENDOSCOPY;  Service: Cardiopulmonary;  Laterality: Bilateral;  . Mastectomy complete / simple w/ sentinel node biopsy Left 04/02/2014    axillary SLN  . Mastectomy complete / simple Right 04/02/2014    PROPHYLACTIC  . Portacath placement Right 04/02/2014  . Breast biopsy Left 11/2013 X 3  . Total abdominal hysterectomy    . Cardiac catheterization  2014  . Simple mastectomy with axillary sentinel node biopsy Bilateral 04/02/2014    Procedure: LEFT TOTAL MASTECTOMY WITH LEFT AXILLARY SENTINEL NODE BIOPSY, RIGHT PROPHYLACTIC MASTETCTOMY;  Surgeon: Claud Kelp, MD;  Location: [facility] OR;  Service: General;  Laterality: Bilateral;  . Portacath placement N/A 04/02/2014    Procedure: INSERTION PORT-A-CATH;  Surgeon: Claud Kelp, MD;  Location: [facility] OR;  Service: General;  Laterality: N/A;  . Left heart catheterization with coronary angiogram N/A 12/26/2012    Procedure: LEFT HEART CATHETERIZATION WITH CORONARY ANGIOGRAM;  Surgeon: Runell Gess, MD;  Location: [facility] CATH LAB;  Service: Cardiovascular;  Laterality: N/A;    FAMILY HISTORY Family History  Problem Relation Age of Onset  . Heart failure Mother   . Colon cancer Father 22    stomach cancer also in 31s  . Throat cancer Brother 83    smoker  . Heart attack Maternal Grandmother   . Colon cancer Paternal Grandmother 61  . Cancer Paternal Grandfather     kidney and bladder  . Throat cancer Brother 37    throat cancer, smoker  . Ovarian cancer Sister 80    ovarian cancer at 78, colorectal cancer at 66  . Breast cancer Paternal Aunt 29  . Ovarian cancer Other 3    niece with ovarian cancer   the patient's father died at the age of 54 from metastatic stomach cancer the patient's father's mother died from colon cancer at the age of 76. The patient's mother died at the age of 55. The patient had 2 brothers and 2 sisters. One brother died at the age of 15 from throat cancer metastatic to  the lung. He was a smoker. A second brother Was diagnosed at age 73 with throat cancer metastatic to lung. He has been given 90 days to live" is not looking good". One sister died from colon cancer metastatic to bone. One sister died of a drug overdose. There is no history of breast or bearing cancer in the family to the patient's knowledge.  GYNECOLOGIC HISTORY:  No LMP recorded. Patient has had a hysterectomy. Menarche age 58, first live birth age 32, the patient is GX P4. She underwent hysterectomy with bilateral salpingo-oophorectomy approximately 1990. She took hormone replacement for approximately 2 years.  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    ADVANCED DIRECTIVES: Not in place   HEALTH MAINTENANCE: History  Substance Use Topics  . Smoking status: Former Smoker -- 0.00 packs/day for 37 years    Quit date: 03/29/2014  . Smokeless tobacco: Never Used  Comment: uses e-cig 01/03/13, starting using Chantix 11/13/13  . Alcohol Use: No     Colonoscopy: August 2015  PAP: May 2013  Bone density:  Lipid panel:  Allergies  Allergen Reactions  . Contrast Media [Iodinated Diagnostic Agents] Anaphylaxis    Heart stops and breathing stops    Current Outpatient Prescriptions  Medication Sig Dispense Refill  . botulinum toxin Type A (BOTOX) 100 UNITS SOLR injection 150 Units every 3 (three) months. Next injection date in Jan 2016.    Marland Kitchen buPROPion (WELLBUTRIN XL) 150 MG 24 hr tablet Take 300 mg by mouth daily.     . cholestyramine (QUESTRAN) 4 G packet Take 1 packet (4 g total) by mouth 3 (three) times daily as needed. 60 each 12  . dexamethasone (DECADRON) 4 MG tablet Take 2 tablets (8 mg total) by mouth 2 (two) times daily. Start the day before Taxotere. Then again the day after chemo for 3 days. 30 tablet 1  . gabapentin (NEURONTIN) 300 MG capsule Take 1  capsule (300 mg total) by mouth 4 (four) times daily. (Patient taking differently: Take 300 mg by mouth 3 (three) times daily. ) 120 capsule 6  . ipratropium-albuterol (DUONEB) 0.5-2.5 (3) MG/3ML SOLN Take 3 mLs by nebulization every 6 (six) hours as needed.    Marland Kitchen levothyroxine (SYNTHROID, LEVOTHROID) 75 MCG tablet Take 75 mcg by mouth daily before breakfast.    . lidocaine-prilocaine (EMLA) cream Apply 1 application topically as needed. Apply over port area 1-2 hours before chemo and cover with plastic wrap 30 g 0  . montelukast (SINGULAIR) 10 MG tablet Take 10 mg by mouth at bedtime.     . ondansetron (ZOFRAN) 8 MG tablet Take 1 tablet (8 mg total) by mouth 2 (two) times daily. 60 tablet 1  . PROAIR HFA 108 (90 BASE) MCG/ACT inhaler Inhale 2 puffs into the lungs every 4 (four) hours as needed for wheezing or shortness of breath.     . prochlorperazine (COMPAZINE) 10 MG tablet Take 1 tablet (10 mg total) by mouth every 6 (six) hours as needed (Nausea or vomiting). 30 tablet 1  . rizatriptan (MAXALT-MLT) 10 MG disintegrating tablet Take 1 tablet (10 mg total) by mouth as needed for migraine. May repeat in 2 hours if needed 15 tablet 11  . tobramycin-dexamethasone (TOBRADEX) ophthalmic solution Place 1 drop into both eyes every 4 (four) hours while awake. 5 mL 0  . verapamil (CALAN-SR) 120 MG CR tablet TAKE 1 TABLET BY MOUTH AT BEDTIME ( MAX 30 DAY SUPPLY PER INSURANCE) 30 tablet 11  . HYDROcodone-acetaminophen (NORCO/VICODIN) 5-325 MG per tablet Take 1 tablet by mouth every 6 (six) hours as needed for moderate pain. 60 tablet 0  . LORazepam (ATIVAN) 0.5 MG tablet Take 1 tablet (0.5 mg total) by mouth at bedtime as needed (Nausea or vomiting). 30 tablet 0  . nystatin (MYCOSTATIN) 100000 UNIT/ML suspension Take 5 mLs (500,000 Units total) by mouth 4 (four) times daily. Swish and swallow (Patient not taking: Reported on 07/10/2014) 240 mL 1  . SUMAtriptan (IMITREX) 6 MG/0.5ML SOLN injection Inject 6 mg  into the skin every 2 (two) hours as needed for migraine or headache. May repeat in 2 hours if headache persists or recurs. Max 2 doses in 24 hours    . tiZANidine (ZANAFLEX) 2 MG tablet Take 1 tablet (2 mg total) by mouth 2 (two) times daily as needed for muscle spasms. (Patient not taking: Reported on 07/10/2014) 60 tablet 11  . traZODone (  DESYREL) 100 MG tablet Take 1 tablet (100 mg total) by mouth at bedtime. 30 tablet 2   Current Facility-Administered Medications  Medication Dose Route Frequency Provider Last Rate Last Dose  . botulinum toxin Type A (BOTOX) injection 150 Units  150 Units Intramuscular Once Marcial Pacas, MD       Facility-Administered Medications Ordered in Other Visits  Medication Dose Route Frequency Provider Last Rate Last Dose  . 0.9 %  sodium chloride infusion   Intravenous Once [name], MD      . acetaminophen (TYLENOL) tablet 650 mg  650 mg Oral Once [name], MD      . CARBOplatin (PARAPLATIN) 460 mg in sodium chloride 0.9 % 250 mL chemo infusion  460 mg Intravenous Once [name], MD      . Dexamethasone Sodium Phosphate (DECADRON) injection 20 mg  20 mg Intravenous Once [name], MD      . diphenhydrAMINE (BENADRYL) capsule 25 mg  25 mg Oral Once [name], MD      . DOCEtaxel (TAXOTERE) 140 mg in dextrose 5 % 250 mL chemo infusion  75 mg/m2 (Treatment Plan Actual) Intravenous Once [name], MD      . heparin lock flush 100 unit/mL  500 Units Intracatheter Once PRN [name], MD      . ondansetron (ZOFRAN) IVPB 16 mg  16 mg Intravenous Once [name], MD      . sodium chloride 0.9 % injection 10 mL  10 mL Intracatheter PRN [name], MD      . trastuzumab (HERCEPTIN) 462 mg in sodium chloride 0.9 % 250 mL chemo infusion  6 mg/kg (Treatment Plan Actual) Intravenous Once [name], MD        OBJECTIVE: Middle-aged white woman who was tearful during today's visit Filed Vitals:    07/10/14 1051  BP: 91/51  Pulse: 88  Temp: 98.3 F (36.8 C)  Resp: 18     Body mass index is 27.52 kg/(m^2).    ECOG FS:1 - Symptomatic but completely ambulatory  Skin: warm, dry  HEENT: sclerae anicteric, conjunctivae pink, oropharynx clear. No thrush or mucositis.  Lymph Nodes: No cervical or supraclavicular lymphadenopathy  Lungs: clear to auscultation bilaterally, no rales, wheezes, or rhonci  Heart: regular rate and rhythm  Abdomen: round, soft, non tender, positive bowel sounds  Musculoskeletal: No focal spinal tenderness, trace edema to hands and bilateral lower extremities.  Neuro: non focal, well oriented, positive affect   LAB RESULTS:  CMP     Component Value Date/Time   NA 139 06/26/2014 1335   NA 142 04/03/2014 0420   K 3.5 06/26/2014 1335   K 4.1 04/03/2014 0420   CL 106 04/03/2014 0420   CO2 21* 06/26/2014 1335   CO2 19 04/03/2014 0420   GLUCOSE 122 06/26/2014 1335   GLUCOSE 162* 04/03/2014 0420   BUN 14.0 06/26/2014 1335   BUN 8 04/03/2014 0420   CREATININE 1.0 06/26/2014 1335   CREATININE 0.71 04/03/2014 0420   CALCIUM 9.4 06/26/2014 1335   CALCIUM 8.6 04/03/2014 0420   PROT 6.7 06/26/2014 1335   PROT 6.5 04/02/2014 0804   ALBUMIN 4.1 06/26/2014 1335   ALBUMIN 3.4* 04/02/2014 0804   AST 11 06/26/2014 1335   AST 20 04/02/2014 0804   ALT 13 06/26/2014 1335   ALT 21 04/02/2014 0804   ALKPHOS 98 06/26/2014 1335   ALKPHOS 112 04/02/2014 0804   BILITOT 0.59  06/26/2014 1335   BILITOT 0.3 04/02/2014 0804   GFRNONAA >90 04/03/2014 0420   GFRAA >90 04/03/2014 0420    I No results found for: SPEP  Lab Results  Component Value Date   WBC 3.4* 07/10/2014   NEUTROABS 2.3 07/10/2014   HGB 9.0* 07/10/2014   HCT 27.5* 07/10/2014   MCV 96.8 07/10/2014   PLT 120* 07/10/2014      Chemistry      Component Value Date/Time   NA 139 06/26/2014 1335   NA 142 04/03/2014 0420   K 3.5 06/26/2014 1335   K 4.1 04/03/2014 0420   CL 106 04/03/2014 0420    CO2 21* 06/26/2014 1335   CO2 19 04/03/2014 0420   BUN 14.0 06/26/2014 1335   BUN 8 04/03/2014 0420   CREATININE 1.0 06/26/2014 1335   CREATININE 0.71 04/03/2014 0420      Component Value Date/Time   CALCIUM 9.4 06/26/2014 1335   CALCIUM 8.6 04/03/2014 0420   ALKPHOS 98 06/26/2014 1335   ALKPHOS 112 04/02/2014 0804   AST 11 06/26/2014 1335   AST 20 04/02/2014 0804   ALT 13 06/26/2014 1335   ALT 21 04/02/2014 0804   BILITOT 0.59 06/26/2014 1335   BILITOT 0.3 04/02/2014 0804       No results found for: LABCA2  No components found for: LABCA125  No results for input(s): INR in the last 168 hours.  Urinalysis    Component Value Date/Time   COLORURINE YELLOW 12/25/2012 2225   APPEARANCEUR CLOUDY* 12/25/2012 2225   LABSPEC 1.010 07/10/2014 1004   LABSPEC 1.027 12/25/2012 2225   PHURINE 5.0 12/25/2012 2225   GLUCOSEU Negative 07/10/2014 1004   GLUCOSEU NEGATIVE 12/25/2012 2225   HGBUR LARGE* 12/25/2012 2225   BILIRUBINUR NEGATIVE 12/25/2012 2225   KETONESUR NEGATIVE 12/25/2012 2225   PROTEINUR NEGATIVE 12/25/2012 2225   UROBILINOGEN 0.2 07/10/2014 1004   UROBILINOGEN 0.2 12/25/2012 2225   NITRITE NEGATIVE 12/25/2012 2225   LEUKOCYTESUR TRACE* 12/25/2012 2225    STUDIES: Most recent echocardiogram on 03/21/14 showed an ejection fraction of 66%  ASSESSMENT: 53 y.o. BRCA negative [place] woman status post left breast biopsy 02/22/2014 for a clinical T2/T3 NX, stage II or 3 invasive ductal carcinoma, grade 3, estrogen and progesterone receptor negative, with an MIB-1 of 83%, and HER-2 amplified with a signals a ratio of 2.07and a copy number per cell of 4.75  (1) biopsy of an additional area of enhancement in the left breast 03/08/2014 showed ductal carcinoma in situ, estrogen and progesterone receptor negative.  (2) status post bilateral mastectomies with left sentinel lymph node sampling 04/02/2014, showing:  (a) on the right, benign breast tissue including a single  negative lymph node  (b) On  the left, a  pT1c pN0, stage IA invasive ductal carcinoma, grade 3, with negative margins  (3) adjuvant chemotherapy started 05/08/2014, to consist of carboplatin and docetaxel given every 3 weeks x6, together with trastuzumab   (4) trastuzumab to be continued to complete a year. Most recent echocardiogram 03/21/2014 showed a normal ejection fraction  (5) reconstruction to follow after completion of chemotherapy  (6) chest wall pain  (7) Hx of smoking: The patient quit smoking 03/30/2014  PLAN: [name] is doing slightly better this week. The labs were reviewed in detail were relatively stable. Her hgb has dropped to 9.0 but she is asymptomatic at this time so we will simply continue to observe this value. She will proceed with cycle 3 of carboplatin, docetaxel, and trastuzumab today.  I have asked that the docetaxel be infused at a slower rate, as the patient complains that when the drug is given to quickly her side effects are enhanced. Her next echocardiogram is scheduled for 07/24/14.   Her urinalysis was negative for a UTI but there was a moderate amount of blood present. This finding combined with her continued low back pain and frequency raises suspicion for a kidney stone. I have placed orders for at CT of the abdomen/pelvis to be performed. Because she is allergic to IV contrast, I have sent a prescription for a prednisone premed protocol and advised the patient on how to dose herself with this along with benadryl to prevent a reaction. She will take 62m prednisone 13 hours, 7 hours, and 1 hour before the scan along with benadryl 520m1 hr prior to the scan. In the meantime she will drink plenty of fluids and avoid caffeine.   LyJeani Hawkingill avoid sodium intake, elevate her legs, and wear compression hose to avoid bilateral lower extremity swelling. At present it is very minimal.   LyJeani Hawkingill return next week for labs and a nadir visit. She understands and agrees with  this plan. She knows the goal of treatment in her case is cure. She has been encouraged to call with any issues that might arise before her next visit here.   FeMarcelino DusterNP   07/10/2014 12:13 PM

## 2014-07-11 ENCOUNTER — Ambulatory Visit (HOSPITAL_BASED_OUTPATIENT_CLINIC_OR_DEPARTMENT_OTHER): Payer: BC Managed Care – PPO

## 2014-07-11 ENCOUNTER — Encounter: Payer: Self-pay | Admitting: Nurse Practitioner

## 2014-07-11 DIAGNOSIS — Z5189 Encounter for other specified aftercare: Secondary | ICD-10-CM

## 2014-07-11 DIAGNOSIS — C50812 Malignant neoplasm of overlapping sites of left female breast: Secondary | ICD-10-CM

## 2014-07-11 DIAGNOSIS — C50412 Malignant neoplasm of upper-outer quadrant of left female breast: Secondary | ICD-10-CM

## 2014-07-11 MED ORDER — PREDNISONE 50 MG PO TABS: ORAL_TABLET | ORAL | Status: DC

## 2014-07-11 MED ORDER — PEGFILGRASTIM INJECTION 6 MG/0.6ML ~~LOC~~
6.0000 mg | PREFILLED_SYRINGE | Freq: Once | SUBCUTANEOUS | Status: AC
  Administered 2014-07-11: 6 mg via SUBCUTANEOUS
  Filled 2014-07-11: qty 0.6

## 2014-07-11 NOTE — Patient Instructions (Signed)
Pegfilgrastim injection What is this medicine? PEGFILGRASTIM (peg fil GRA stim) is a long-acting granulocyte colony-stimulating factor that stimulates the growth of neutrophils, a type of white blood cell important in the body's fight against infection. It is used to reduce the incidence of fever and infection in patients with certain types of cancer who are receiving chemotherapy that affects the bone marrow. This medicine may be used for other purposes; ask your health care provider or pharmacist if you have questions. COMMON BRAND NAME(S): Neulasta What should I tell my health care provider before I take this medicine? They need to know if you have any of these conditions: -latex allergy -ongoing radiation therapy -sickle cell disease -skin reactions to acrylic adhesives (On-Body Injector only) -an unusual or allergic reaction to pegfilgrastim, filgrastim, other medicines, foods, dyes, or preservatives -pregnant or trying to get pregnant -breast-feeding How should I use this medicine? This medicine is for injection under the skin. If you get this medicine at home, you will be taught how to prepare and give the pre-filled syringe or how to use the On-body Injector. Refer to the patient Instructions for Use for detailed instructions. Use exactly as directed. Take your medicine at regular intervals. Do not take your medicine more often than directed. It is important that you put your used needles and syringes in a special sharps container. Do not put them in a trash can. If you do not have a sharps container, call your pharmacist or healthcare provider to get one. Talk to your pediatrician regarding the use of this medicine in children. Special care may be needed. Overdosage: If you think you have taken too much of this medicine contact a poison control center or emergency room at once. NOTE: This medicine is only for you. Do not share this medicine with others. What if I miss a dose? It is  important not to miss your dose. Call your doctor or health care professional if you miss your dose. If you miss a dose due to an On-body Injector failure or leakage, a new dose should be administered as soon as possible using a single prefilled syringe for manual use. What may interact with this medicine? Interactions have not been studied. Give your health care provider a list of all the medicines, herbs, non-prescription drugs, or dietary supplements you use. Also tell them if you smoke, drink alcohol, or use illegal drugs. Some items may interact with your medicine. This list may not describe all possible interactions. Give your health care provider a list of all the medicines, herbs, non-prescription drugs, or dietary supplements you use. Also tell them if you smoke, drink alcohol, or use illegal drugs. Some items may interact with your medicine. What should I watch for while using this medicine? You may need blood work done while you are taking this medicine. If you are going to need a MRI, CT scan, or other procedure, tell your doctor that you are using this medicine (On-Body Injector only). What side effects may I notice from receiving this medicine? Side effects that you should report to your doctor or health care professional as soon as possible: -allergic reactions like skin rash, itching or hives, swelling of the face, lips, or tongue -dizziness -fever -pain, redness, or irritation at site where injected -pinpoint red spots on the skin -shortness of breath or breathing problems -stomach or side pain, or pain at the shoulder -swelling -tiredness -trouble passing urine Side effects that usually do not require medical attention (report to your doctor   or health care professional if they continue or are bothersome): -bone pain -muscle pain This list may not describe all possible side effects. Call your doctor for medical advice about side effects. You may report side effects to FDA at  1-800-FDA-1088. Where should I keep my medicine? Keep out of the reach of children. Store pre-filled syringes in a refrigerator between 2 and 8 degrees C (36 and 46 degrees F). Do not freeze. Keep in carton to protect from light. Throw away this medicine if it is left out of the refrigerator for more than 48 hours. Throw away any unused medicine after the expiration date. NOTE: This sheet is a summary. It may not cover all possible information. If you have questions about this medicine, talk to your doctor, pharmacist, or health care provider.  2015, Elsevier/Gold Standard. (2013-10-05 16:14:05)  

## 2014-07-12 LAB — URINE CULTURE

## 2014-07-17 ENCOUNTER — Ambulatory Visit (HOSPITAL_BASED_OUTPATIENT_CLINIC_OR_DEPARTMENT_OTHER): Payer: BC Managed Care – PPO | Admitting: Oncology

## 2014-07-17 ENCOUNTER — Ambulatory Visit: Payer: BC Managed Care – PPO | Admitting: Nurse Practitioner

## 2014-07-17 ENCOUNTER — Telehealth: Payer: Self-pay | Admitting: Oncology

## 2014-07-17 ENCOUNTER — Ambulatory Visit (HOSPITAL_COMMUNITY)
Admission: RE | Admit: 2014-07-17 | Discharge: 2014-07-17 | Disposition: A | Discharge status: Home / Self Care | Payer: BC Managed Care – PPO | Source: Ambulatory Visit | Attending: Nurse Practitioner | Admitting: Nurse Practitioner

## 2014-07-17 ENCOUNTER — Other Ambulatory Visit (HOSPITAL_BASED_OUTPATIENT_CLINIC_OR_DEPARTMENT_OTHER): Payer: BC Managed Care – PPO

## 2014-07-17 ENCOUNTER — Other Ambulatory Visit: Payer: BC Managed Care – PPO

## 2014-07-17 VITALS — BP 106/65 | HR 93 | Temp 98.2°F | Resp 17 | Ht 66.0 in | Wt 165.0 lb

## 2014-07-17 DIAGNOSIS — Z9049 Acquired absence of other specified parts of digestive tract: Secondary | ICD-10-CM | POA: Insufficient documentation

## 2014-07-17 DIAGNOSIS — Z9013 Acquired absence of bilateral breasts and nipples: Secondary | ICD-10-CM | POA: Insufficient documentation

## 2014-07-17 DIAGNOSIS — I700 Atherosclerosis of aorta: Secondary | ICD-10-CM | POA: Diagnosis not present

## 2014-07-17 DIAGNOSIS — R319 Hematuria, unspecified: Secondary | ICD-10-CM | POA: Insufficient documentation

## 2014-07-17 DIAGNOSIS — Z720 Tobacco use: Secondary | ICD-10-CM

## 2014-07-17 DIAGNOSIS — Z90710 Acquired absence of both cervix and uterus: Secondary | ICD-10-CM | POA: Insufficient documentation

## 2014-07-17 DIAGNOSIS — R350 Frequency of micturition: Secondary | ICD-10-CM | POA: Diagnosis not present

## 2014-07-17 DIAGNOSIS — I7090 Unspecified atherosclerosis: Secondary | ICD-10-CM

## 2014-07-17 DIAGNOSIS — J9811 Atelectasis: Secondary | ICD-10-CM | POA: Diagnosis not present

## 2014-07-17 DIAGNOSIS — Z853 Personal history of malignant neoplasm of breast: Secondary | ICD-10-CM | POA: Insufficient documentation

## 2014-07-17 DIAGNOSIS — Z171 Estrogen receptor negative status [ER-]: Secondary | ICD-10-CM

## 2014-07-17 DIAGNOSIS — Z9089 Acquired absence of other organs: Secondary | ICD-10-CM | POA: Diagnosis not present

## 2014-07-17 DIAGNOSIS — C50812 Malignant neoplasm of overlapping sites of left female breast: Secondary | ICD-10-CM

## 2014-07-17 DIAGNOSIS — R109 Unspecified abdominal pain: Secondary | ICD-10-CM

## 2014-07-17 DIAGNOSIS — M545 Low back pain, unspecified: Secondary | ICD-10-CM

## 2014-07-17 DIAGNOSIS — Z87891 Personal history of nicotine dependence: Secondary | ICD-10-CM

## 2014-07-17 DIAGNOSIS — C50412 Malignant neoplasm of upper-outer quadrant of left female breast: Secondary | ICD-10-CM

## 2014-07-17 DIAGNOSIS — D630 Anemia in neoplastic disease: Secondary | ICD-10-CM

## 2014-07-17 DIAGNOSIS — C50419 Malignant neoplasm of upper-outer quadrant of unspecified female breast: Secondary | ICD-10-CM

## 2014-07-17 LAB — CBC WITH DIFFERENTIAL/PLATELET
BASO%: 0.3 % (ref 0.0–2.0)
Basophils Absolute: 0 10*3/uL (ref 0.0–0.1)
EOS%: 0 % (ref 0.0–7.0)
Eosinophils Absolute: 0 10*3/uL (ref 0.0–0.5)
HCT: 29.6 % — ABNORMAL LOW (ref 34.8–46.6)
HGB: 10.1 g/dL — ABNORMAL LOW (ref 11.6–15.9)
LYMPH%: 6.9 % — ABNORMAL LOW (ref 14.0–49.7)
MCH: 32.7 pg (ref 25.1–34.0)
MCHC: 34.1 g/dL (ref 31.5–36.0)
MCV: 95.9 fL (ref 79.5–101.0)
MONO#: 0.7 10*3/uL (ref 0.1–0.9)
MONO%: 14.4 % — ABNORMAL HIGH (ref 0.0–14.0)
NEUT#: 4 10*3/uL (ref 1.5–6.5)
NEUT%: 78.4 % — ABNORMAL HIGH (ref 38.4–76.8)
Platelets: 210 10*3/uL (ref 145–400)
RBC: 3.08 10*6/uL — ABNORMAL LOW (ref 3.70–5.45)
RDW: 19 % — ABNORMAL HIGH (ref 11.2–14.5)
WBC: 5.1 10*3/uL (ref 3.9–10.3)
lymph#: 0.4 10*3/uL — ABNORMAL LOW (ref 0.9–3.3)

## 2014-07-17 LAB — COMPREHENSIVE METABOLIC PANEL (CC13)
ALT: 11 U/L (ref 0–55)
AST: 8 U/L (ref 5–34)
Albumin: 3.8 g/dL (ref 3.5–5.0)
Alkaline Phosphatase: 110 U/L (ref 40–150)
Anion Gap: 11 mEq/L (ref 3–11)
BUN: 9.1 mg/dL (ref 7.0–26.0)
CO2: 21 mEq/L — ABNORMAL LOW (ref 22–29)
Calcium: 9 mg/dL (ref 8.4–10.4)
Chloride: 102 mEq/L (ref 98–109)
Creatinine: 0.8 mg/dL (ref 0.6–1.1)
EGFR: 87 mL/min/{1.73_m2} — ABNORMAL LOW (ref >90)
Glucose: 110 mg/dl (ref 70–140)
Potassium: 4.5 mEq/L (ref 3.5–5.1)
Sodium: 134 mEq/L — ABNORMAL LOW (ref 136–145)
Total Bilirubin: 0.56 mg/dL (ref 0.20–1.20)
Total Protein: 6.5 g/dL (ref 6.4–8.3)

## 2014-07-17 MED ORDER — NITROFURANTOIN MONOHYD MACRO 100 MG PO CAPS: ORAL_CAPSULE | ORAL | Status: DC

## 2014-07-17 MED ORDER — IOHEXOL 300 MG/ML  SOLN
100.0000 mL | Freq: Once | INTRAMUSCULAR | Status: AC | PRN
  Administered 2014-07-17: 100 mL via INTRAVENOUS

## 2014-07-17 NOTE — Progress Notes (Signed)
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 She brought it to her primary physician's attention him a and on 02/21/2014 she underwent bilateral diagnostic mammography with tomography at [facility]. The breast composition was category B. In the left breast at the 3:00 position there was an irregular mass associated with skin retraction. Ultrasound performed on the same day confirmed a 1.1 cm irregular solid mass in the left breast at the 3:00 position. There were no abnormalities by sonography in the left axilla or in the superior portion of the breast, which is for the patient experiences some tenderness.  Biopsy of the left breast area in question 02/22/2014 showed ([id number]) and invasive ductal carcinoma, grade 3, estrogen and progesterone receptor negative, with an MIB-1 of 83%, and HER-2 amplified, the signals ratio being 2.07, and a copy number per cell 4.75.  On 02/28/2014 the patient underwent bilateral breast MRI, which showed in the posterior third of the left breast at the 3:00 position an irregular spiculated mass measuring 1.8 cm. The left breast was unremarkable and there were no abnormal appearing lymph nodes.  The patient's subsequent history is as  detailed below  INTERVAL HISTORY: Stacy Fernandez returns today for follow up of her breast cancer accompanied by her husband Stacy Fernandez and their daughter.. Today is day 8 cycle 4 of 6 planned cycles of cyclophosphamide, docetaxel and trastuzumab, which she receives every 21 days, with Neulasta on day 2.  REVIEW OF SYSTEMS:  She loses weight and then again said back. She is having problems sleeping because there is a place in her throat that is very raw. She sleeps poorly. She has pain in her lower back at times, more towards the left than towards the right. She has sinus problems, hoarseness, rarely mouth sores, but no cough or shortness of breath except when she is walking up stairs. She is sleeping on 2 pillows. She still has some urinary symptoms including some dribbling in her urine and occasionally a little bit of blood in her urine and she says. She bruises easily. She has of course a history of severe headaches and is already scheduled for Botox treatment 07/25/2014. She feels depressed. Sometimes she has mild ankle swelling. This is inconstant. A detailed review of systems today was otherwise stable  PAST MEDICAL HISTORY: Past Medical History  Diagnosis Date  . Hypothyroidism   . Shortness of breath     2D ECHO, 04/20/2012 - EF >55%, normal; EXERCISE STRESS TEST, 04/20/2012 - normal, EKG negative for ischemia, no ECG changes  . COPD (chronic obstructive pulmonary disease)   . Emphysema   . Hematuria     cause unknow- saw Urologist  . Hot flashes   . Anginal pain     Dr Claiborne Billings cardiologist- test normal.  " think it is from breast cancer"  . Brain aneurysm  Dr Jannifer Franklin neurologist is monitoring  . Anxiety     Due to situation, CA breast  . Chronic bronchitis     "I've had it quite a few times" (04/02/2014)  . GERD (gastroesophageal reflux disease)   . Migraine     "treated w/botox for them" (04/02/2014)    PAST SURGICAL HISTORY: Past Surgical History  Procedure Laterality Date  .  Cholecystectomy    . Appendectomy    . Laparoscopic nissen fundoplication  11/3662    for GERD- not a problem  . Video bronchoscopy Bilateral 01/16/2013    Procedure: VIDEO BRONCHOSCOPY WITHOUT FLUORO;  Surgeon: [name], MD;  Location: [facility];  Service: Cardiopulmonary;  Laterality: Bilateral;  . Mastectomy complete / simple w/ sentinel node biopsy Left 04/02/2014    axillary SLN  . Mastectomy complete / simple Right 04/02/2014    PROPHYLACTIC  . Portacath placement Right 04/02/2014  . Breast biopsy Left 11/2013 X 3  . Total abdominal hysterectomy    . Cardiac catheterization  2014  . Simple mastectomy with axillary sentinel node biopsy Bilateral 04/02/2014    Procedure: LEFT TOTAL MASTECTOMY WITH LEFT AXILLARY SENTINEL NODE BIOPSY, RIGHT PROPHYLACTIC MASTETCTOMY;  Surgeon: [name], MD;  Location: [place];  Service: General;  Laterality: Bilateral;  . Portacath placement N/A 04/02/2014    Procedure: INSERTION PORT-A-CATH;  Surgeon: [name], MD;  Location: [place];  Service: General;  Laterality: N/A;  . Left heart catheterization with coronary angiogram N/A 12/26/2012    Procedure: LEFT HEART CATHETERIZATION WITH CORONARY ANGIOGRAM;  Surgeon: Lorretta Harp, MD;  Location: [facility] CATH LAB;  Service: Cardiovascular;  Laterality: N/A;    FAMILY HISTORY Family History  Problem Relation Age of Onset  . Heart failure Mother   . Colon cancer Father 71    stomach cancer also in 55s  . Throat cancer Brother 50    smoker  . Heart attack Maternal Grandmother   . Colon cancer Paternal Grandmother 26  . Cancer Paternal Grandfather     kidney and bladder  . Throat cancer Brother 37    throat cancer, smoker  . Ovarian cancer Sister 70    ovarian cancer at 50, colorectal cancer at 61  . Breast cancer Paternal Aunt 34  . Ovarian cancer Other 76    niece with ovarian cancer   the patient's father died at the age of 15 from metastatic stomach cancer the patient's father's mother died  from colon cancer at the age of 68. The patient's mother died at the age of 41. The patient had 2 brothers and 2 sisters. One brother died at the age of 68 from throat cancer metastatic to the lung. He was a smoker. A second brother Was diagnosed at age 53 with throat cancer metastatic to lung. He has been given 90 days to live" is not looking good". One sister died from colon cancer metastatic to bone. One sister died of a drug overdose. There is no history of breast or bearing cancer in the family to the patient's knowledge.  GYNECOLOGIC HISTORY:  No LMP recorded. Patient has had a hysterectomy. Menarche age 82, first live birth age 78, the patient is [place]ch[place] P4. She underwent hysterectomy with bilateral salpingo-oophorectomy approximately 1990. She took hormone replacement for approximately 2 years.  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    ADVANCED DIRECTIVES: Not  in place   HEALTH MAINTENANCE: History  Substance Use Topics  . Smoking status: Former Smoker -- 0.00 packs/day for 37 years    Quit date: 03/29/2014  . Smokeless tobacco: Never Used     Comment: uses e-cig 01/03/13, starting using Chantix 11/13/13  . Alcohol Use: No     Colonoscopy: August 2015  PAP: May 2013  Bone density:  Lipid panel:  Allergies  Allergen Reactions  . Contrast Media [Iodinated Diagnostic Agents] Anaphylaxis    Heart stops and breathing stops; patient ok with 13 hour prep    Current Outpatient Prescriptions  Medication Sig Dispense Refill  . botulinum toxin Type A (BOTOX) 100 UNITS SOLR injection 150 Units every 3 (three) months. Next injection date in Jan 2016.    Marland Kitchen buPROPion (WELLBUTRIN XL) 150 MG 24 hr tablet Take 300 mg by mouth daily.     . cholestyramine (QUESTRAN) 4 G packet Take 1 packet (4 g total) by mouth 3 (three) times daily as needed. 60 each 12  . dexamethasone  (DECADRON) 4 MG tablet Take 2 tablets (8 mg total) by mouth 2 (two) times daily. Start the day before Taxotere. Then again the day after chemo for 3 days. 30 tablet 1  . gabapentin (NEURONTIN) 300 MG capsule Take 1 capsule (300 mg total) by mouth 4 (four) times daily. (Patient taking differently: Take 300 mg by mouth 3 (three) times daily. ) 120 capsule 6  . HYDROcodone-acetaminophen (NORCO/VICODIN) 5-325 MG per tablet Take 1 tablet by mouth every 6 (six) hours as needed for moderate pain. 60 tablet 0  . ipratropium-albuterol (DUONEB) 0.5-2.5 (3) MG/3ML SOLN Take 3 mLs by nebulization every 6 (six) hours as needed.    Marland Kitchen levothyroxine (SYNTHROID, LEVOTHROID) 75 MCG tablet Take 75 mcg by mouth daily before breakfast.    . lidocaine-prilocaine (EMLA) cream Apply 1 application topically as needed. Apply over port area 1-2 hours before chemo and cover with plastic wrap 30 g 0  . LORazepam (ATIVAN) 0.5 MG tablet Take 1 tablet (0.5 mg total) by mouth at bedtime as needed (Nausea or vomiting). 30 tablet 0  . montelukast (SINGULAIR) 10 MG tablet Take 10 mg by mouth at bedtime.     Marland Kitchen nystatin (MYCOSTATIN) 100000 UNIT/ML suspension Take 5 mLs (500,000 Units total) by mouth 4 (four) times daily. Swish and swallow (Patient not taking: Reported on 07/10/2014) 240 mL 1  . ondansetron (ZOFRAN) 8 MG tablet Take 1 tablet (8 mg total) by mouth 2 (two) times daily. 60 tablet 1  . predniSONE (DELTASONE) 50 MG tablet 1 tablet 13 hours prior to CT scan, 7 hours prior, and 1 hour prior 3 tablet 0  . PROAIR HFA 108 (90 BASE) MCG/ACT inhaler Inhale 2 puffs into the lungs every 4 (four) hours as needed for wheezing or shortness of breath.     . prochlorperazine (COMPAZINE) 10 MG tablet Take 1 tablet (10 mg total) by mouth every 6 (six) hours as needed (Nausea or vomiting). 30 tablet 1  . rizatriptan (MAXALT-MLT) 10 MG disintegrating tablet Take 1 tablet (10 mg total) by mouth as needed for migraine. May repeat in 2 hours if  needed 15 tablet 11  . SUMAtriptan (IMITREX) 6 MG/0.5ML SOLN injection Inject 6 mg into the skin every 2 (two) hours as needed for migraine or headache. May repeat in 2 hours if headache persists or recurs. Max 2 doses in 24 hours    . tiZANidine (ZANAFLEX) 2 MG tablet Take 1 tablet (2 mg  total) by mouth 2 (two) times daily as needed for muscle spasms. (Patient not taking: Reported on 07/10/2014) 60 tablet 11  . tobramycin-dexamethasone (TOBRADEX) ophthalmic solution Place 1 drop into both eyes every 4 (four) hours while awake. 5 mL 0  . traZODone (DESYREL) 100 MG tablet Take 1 tablet (100 mg total) by mouth at bedtime. 30 tablet 2  . verapamil (CALAN-SR) 120 MG CR tablet TAKE 1 TABLET BY MOUTH AT BEDTIME ( MAX 30 DAY SUPPLY PER INSURANCE) 30 tablet 11   Current Facility-Administered Medications  Medication Dose Route Frequency Provider Last Rate Last Dose  . botulinum toxin Type A (BOTOX) injection 150 Units  150 Units Intramuscular Once Marcial Pacas, MD        OBJECTIVE: Middle-aged white woman who appears stated age 16 Vitals:   07/17/14 0946  BP: 106/65  Pulse: 93  Temp: 98.2 F (36.8 C)  Resp: 17     Body mass index is 26.64 kg/(m^2).    ECOG FS:1 - Symptomatic but completely ambulatory  Sclerae unicteric, pupils equal and reactive Oropharynx clear, full upper plate No cervical or supraclavicular adenopathy Lungs no rales or rhonchi Heart regular rate and rhythm Abd soft, nontender, positive bowel sounds, no masses palpated MSK no focal spinal tenderness, no upper extremity lymphedema Neuro: nonfocal, well oriented, appropriate affect Breasts: Deferred   LAB RESULTS:  CMP     Component Value Date/Time   NA 134* 07/17/2014 0911   NA 142 04/03/2014 0420   K 4.5 07/17/2014 0911   K 4.1 04/03/2014 0420   CL 106 04/03/2014 0420   CO2 21* 07/17/2014 0911   CO2 19 04/03/2014 0420   GLUCOSE 110 07/17/2014 0911   GLUCOSE 162* 04/03/2014 0420   BUN 9.1 07/17/2014 0911   BUN  8 04/03/2014 0420   CREATININE 0.8 07/17/2014 0911   CREATININE 0.71 04/03/2014 0420   CALCIUM 9.0 07/17/2014 0911   CALCIUM 8.6 04/03/2014 0420   PROT 6.5 07/17/2014 0911   PROT 6.5 04/02/2014 0804   ALBUMIN 3.8 07/17/2014 0911   ALBUMIN 3.4* 04/02/2014 0804   AST 8 07/17/2014 0911   AST 20 04/02/2014 0804   ALT 11 07/17/2014 0911   ALT 21 04/02/2014 0804   ALKPHOS 110 07/17/2014 0911   ALKPHOS 112 04/02/2014 0804   BILITOT 0.56 07/17/2014 0911   BILITOT 0.3 04/02/2014 0804   GFRNONAA >90 04/03/2014 0420   GFRAA >90 04/03/2014 0420    I No results found for: SPEP  Lab Results  Component Value Date   WBC 5.1 07/17/2014   NEUTROABS 4.0 07/17/2014   HGB 10.1* 07/17/2014   HCT 29.6* 07/17/2014   MCV 95.9 07/17/2014   PLT 210 07/17/2014      Chemistry      Component Value Date/Time   NA 134* 07/17/2014 0911   NA 142 04/03/2014 0420   K 4.5 07/17/2014 0911   K 4.1 04/03/2014 0420   CL 106 04/03/2014 0420   CO2 21* 07/17/2014 0911   CO2 19 04/03/2014 0420   BUN 9.1 07/17/2014 0911   BUN 8 04/03/2014 0420   CREATININE 0.8 07/17/2014 0911   CREATININE 0.71 04/03/2014 0420      Component Value Date/Time   CALCIUM 9.0 07/17/2014 0911   CALCIUM 8.6 04/03/2014 0420   ALKPHOS 110 07/17/2014 0911   ALKPHOS 112 04/02/2014 0804   AST 8 07/17/2014 0911   AST 20 04/02/2014 0804   ALT 11 07/17/2014 0911   ALT 21 04/02/2014 0804  BILITOT 0.56 07/17/2014 0911   BILITOT 0.3 04/02/2014 0804       No results found for: LABCA2  No components found for: [id number]  No results for input(s): INR in the last 168 hours.  Urinalysis    Component Value Date/Time   COLORURINE YELLOW 12/25/2012 2225   APPEARANCEUR CLOUDY* 12/25/2012 2225   LABSPEC 1.010 07/10/2014 1004   LABSPEC 1.027 12/25/2012 2225   PHURINE 5.0 12/25/2012 2225   GLUCOSEU Negative 07/10/2014 1004   GLUCOSEU NEGATIVE 12/25/2012 2225   HGBUR LARGE* 12/25/2012 2225   BILIRUBINUR NEGATIVE 12/25/2012 2225     KETONESUR NEGATIVE 12/25/2012 2225   PROTEINUR NEGATIVE 12/25/2012 2225   UROBILINOGEN 0.2 07/10/2014 1004   UROBILINOGEN 0.2 12/25/2012 2225   NITRITE NEGATIVE 12/25/2012 2225   LEUKOCYTESUR TRACE* 12/25/2012 2225    STUDIES: Ct Abdomen Pelvis W Contrast  07/17/2014   CLINICAL DATA:  Bilateral low back pain. Hematuria. Urinary frequency. Appendectomy. Cholecystectomy. Breast cancer in August. Bilateral mastectomies. Chemotherapy in progress. Possible kidney stones. ICD10: R 35.0  EXAM: CT ABDOMEN AND PELVIS WITH CONTRAST  TECHNIQUE: Multidetector CT imaging of the abdomen and pelvis was performed using the standard protocol following bolus administration of intravenous contrast.  CONTRAST:  17m OMNIPAQUE IOHEXOL 300 MG/ML  SOLN  COMPARISON:  PET 03/12/2014.  Prior diagnostic CT of 04/28/2012.  FINDINGS: Lower chest: Minimal atelectasis at the right lung base. Normal heart size without pericardial or pleural effusion. Surgical changes at the gastroesophageal junction.  Hepatobiliary: Normal liver. Cholecystectomy, without biliary ductal dilatation.  Pancreas: Normal, without mass or pancreatic ductal dilatation.  Spleen: Normal  Adrenals/Urinary Tract: Normal adrenal glands. No renal calculi or hydronephrosis. Too small to characterize lesions in the left kidney. No hydroureter. The urinary bladder extends superiorly and anteriorly towards the umbilicus, similar to on the prior PET. There is gas within this portion of the bladder, including on image 66 of series 2 and image 61 sagittal.  Stomach/Bowel: Normal stomach. The cecum is positioned within the right paracentral pelvis. Normal terminal ileum. Appendix not visualized.  The proximal transverse duodenum is mildly prominent on image 34 of series 2. More distally, it is normal in caliber. Small bowel is otherwise unremarkable.  Vascular/Lymphatic: Aortic and branch vessel atherosclerosis. No abdominopelvic adenopathy.  Reproductive: Hysterectomy.   No adnexal mass.  Other:  No significant free fluid.  Musculoskeletal: No focal osseous abnormality.  IMPRESSION: 1. No evidence of urinary tract calculi or urinary tract obstruction. Sensitivity for small stones is slightly decreased secondary to post-contrast technique. 2. Prominence of the proximal transverse duodenum. This could be incidental/transient (not persistent on kidney delayed images). In the appropriate clinical setting, a component of superior mesenteric artery syndrome could have a similar appearance. 3. Anterior and cephalad extension of the urinary bladder. This could be secondary to scarring from prior surgery or a urachal remnant (urachal diverticulum). There is gas within this extension. If the patient has undergone recent instrumentation, this is likely iatrogenic. If not, infection would be a consideration. 4. Advanced atherosclerosis for age. 5. No evidence of metastatic disease.   Electronically Signed   By: KAbigail MiyamotoM.D.   On: 07/17/2014 09:41     ASSESSMENT: 53y.o. BRCA negative [place] woman status post left breast biopsy 02/22/2014 for a clinical T2/T3 NX, stage II or 3 invasive ductal carcinoma, grade 3, estrogen and progesterone receptor negative, with an MIB-1 of 83%, and HER-2 amplified with a signals a ratio of 2.07and a copy number per cell of 4.75  (  1) biopsy of an additional area of enhancement in the left breast 03/08/2014 showed ductal carcinoma in situ, estrogen and progesterone receptor negative.  (2) status post bilateral mastectomies with left sentinel lymph node sampling 04/02/2014, showing:  (a) on the right, benign breast tissue including a single negative lymph node  (b) On  the left, a  pT1c pN0, stage IA invasive ductal carcinoma, grade 3, with negative margins  (3) adjuvant chemotherapy started 05/08/2014, consisting of carboplatin and docetaxel given every 3 weeks x6, together with trastuzumab, with neulasta day 2   (4) trastuzumab to be  continued to complete a year (through OCT 2016) Most recent echocardiogram 03/21/2014 showed a normal ejection fraction  (5) reconstruction to follow after completion of chemotherapy ([place])  (6) flank pain and coag-negative staph in urine: to start nitrofurantoin 07/17/2014  (7) Hx of smoking: The patient quit smoking 03/30/2014  PLAN: Stacy Fernandez is struggling to complete her adjuvant chemotherapy. She only has 2 more cycles to go. The CT scan of the abdomen and pelvis today is very reassuring. It does show some air in a portion of her bladder, and that, together with the recent symptoms and urinalysis results leads me to go ahead and treat an organism that I normally consider a contaminant. We are going to try 10 days of nitrofurantoin and see if that clears the symptoms.  She is very concerned about the fact that at her work she has to lift about 50 pounds. Apparently she is the only woman there are who does that on all the other ones get the first to do it. That might be something she needs to do. She has pain in her bones from the Neulasta, but knows that a deal with that and does not get constipated from the pain medication. Since she started fluoroscopy store her bowel movements have been pretty much close to normal  She is already beginning to think about reconstruction and is very proud as she should be of having quit smoking. We will see if we get her an appointment with Dr. them up for late February by which time she will be done with her chemotherapy. Of course she is going to be continuing trastuzumab into October 2016. She has her next echocardiogram scheduled for 07/24/2013  [name], MD   07/17/2014 10:01 AM

## 2014-07-23 ENCOUNTER — Telehealth: Payer: Self-pay | Admitting: Oncology

## 2014-07-23 NOTE — Telephone Encounter (Signed)
Faxed pt medical records to Dr. Iran Planas office [phone]

## 2014-07-24 ENCOUNTER — Ambulatory Visit (HOSPITAL_COMMUNITY)
Admission: RE | Admit: 2014-07-24 | Discharge: 2014-07-24 | Disposition: A | Discharge status: Home / Self Care | Payer: BLUE CROSS/BLUE SHIELD | Source: Ambulatory Visit | Attending: Nurse Practitioner | Admitting: Nurse Practitioner

## 2014-07-24 DIAGNOSIS — Z720 Tobacco use: Secondary | ICD-10-CM | POA: Insufficient documentation

## 2014-07-24 DIAGNOSIS — J449 Chronic obstructive pulmonary disease, unspecified: Secondary | ICD-10-CM | POA: Insufficient documentation

## 2014-07-24 DIAGNOSIS — C50412 Malignant neoplasm of upper-outer quadrant of left female breast: Secondary | ICD-10-CM | POA: Diagnosis not present

## 2014-07-24 DIAGNOSIS — R079 Chest pain, unspecified: Secondary | ICD-10-CM | POA: Insufficient documentation

## 2014-07-24 DIAGNOSIS — C50919 Malignant neoplasm of unspecified site of unspecified female breast: Secondary | ICD-10-CM

## 2014-07-24 DIAGNOSIS — I2510 Atherosclerotic heart disease of native coronary artery without angina pectoris: Secondary | ICD-10-CM | POA: Diagnosis not present

## 2014-07-24 NOTE — Progress Notes (Signed)
  Echocardiogram 2D Echocardiogram has been performed.  Stacy Fernandez 07/24/2014, 11:28 AM

## 2014-07-25 ENCOUNTER — Encounter: Payer: Self-pay | Admitting: Neurology

## 2014-07-25 ENCOUNTER — Ambulatory Visit (INDEPENDENT_AMBULATORY_CARE_PROVIDER_SITE_OTHER): Payer: BLUE CROSS/BLUE SHIELD | Admitting: Neurology

## 2014-07-25 DIAGNOSIS — G43019 Migraine without aura, intractable, without status migrainosus: Secondary | ICD-10-CM

## 2014-07-25 DIAGNOSIS — G43719 Chronic migraine without aura, intractable, without status migrainosus: Secondary | ICD-10-CM

## 2014-07-25 MED ORDER — ONABOTULINUMTOXINA 100 UNITS IJ SOLR
200.0000 [IU] | Freq: Once | INTRAMUSCULAR | Status: AC
  Administered 2014-07-25: 200 [IU] via INTRAMUSCULAR

## 2014-07-25 NOTE — Progress Notes (Signed)
History of present illness:  Stacy Fernandez is a 54 year old right-handed white female referred by Dr. Jannifer Franklin for Botox injection as migraine prevention  She had frequent headaches since May 2013, she woke up in May, notice left-sided headache, across her left forehead, feel likes that her left eye is going to pop out, she has no visual loss, first severe headache lasts about 2 days, tried numerous over-the-counter medications, including Excedrin Migraine, Tylenol, Aleve without help,  Ever since the initial episode, she has frequent headaches, most on the left side, occasionally on the right side, excruciating pounding headache, with associated light noise sensitivity, nauseous, she often woke up from sleep with headaches  CT head without contrast was normal, MRI cervical showed mild spondylitic disease, but no acute lesions MRI of the brain done previously was normal. 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    Over the past 2 years, she has tried different preventative medications this including propranolol, Topamax, Effexor, without helping,  She still has 6 headaches in a week , moderately severe, lasting for few days  She received last Botox injection as migraine prevention since Jan 2015, responded very well,  UPDATE Jan 6th 2016: She has double msectomectomy for left  brest cancer, chemotherapy, since last injection in August 2015, she has more frequent headaches during the past few months because of her treatment, Maxalt, Imitrex subcutaneous injection was not as helpful.   Past Medical History  Diagnosis Date  . Hypothyroidism   . Shortness of breath     2D ECHO, 04/20/2012 - EF >55%, normal; EXERCISE STRESS TEST, 04/20/2012 - normal, EKG negative for ischemia, no ECG changes  . COPD (chronic  obstructive pulmonary disease)   . Emphysema   . Migraine   . Hematuria     Past Surgical History  Procedure Laterality Date  . Cholecystectomy    . Abdominal hysterectomy    . Appendectomy    . Endo wrap    . Video bronchoscopy Bilateral 01/16/2013    Procedure: VIDEO BRONCHOSCOPY WITHOUT FLUORO;  Surgeon: [name], MD;  Location: [facility];  Service: Cardiopulmonary;  Laterality: Bilateral;    Family History  Problem Relation Age of Onset  . Heart failure Mother   . Colon cancer Father   . Throat cancer Brother   . Heart attack Maternal Grandmother   . Lung cancer Maternal Grandfather   . Colon cancer Paternal Grandmother   . Cancer Paternal Grandfather     kidney and bladder  . Lung cancer Brother   . Throat cancer Brother   . Lung cancer Brother     Social history:  reports that she quit smoking about 3 months ago. She has never used smokeless tobacco. She reports that she does not drink alcohol or use illicit drugs.  Medications:  Current Outpatient Prescriptions on File Prior to Visit  Medication Sig Dispense Refill  . aspirin 81 MG tablet Take 1 tablet (81 mg total) by mouth daily.  30 tablet  0  . gabapentin (NEURONTIN) 100 MG capsule Take 200 mg by mouth at bedtime.       Marland Kitchen ketorolac (TORADOL) 10 MG tablet Take 1 tablet (10 mg total) by mouth every 6 (six) hours as needed (headache).  30 tablet  0  . levothyroxine (SYNTHROID, LEVOTHROID) 75 MCG tablet Take 75 mcg by mouth  daily before breakfast.      . mometasone-formoterol (DULERA) 100-5 MCG/ACT AERO Inhale 2 puffs into the lungs 2 (two) times daily.      . nitroGLYCERIN (NITROSTAT) 0.4 MG SL tablet Place 0.4 mg under the tongue every 5 (five) minutes as needed for chest pain.      Marland Kitchen PROAIR HFA 108 (90 BASE) MCG/ACT inhaler Inhale 2 puffs into the lungs every 4 (four) hours as needed.      Marland Kitchen tiZANidine (ZANAFLEX) 2 MG tablet Take 1 tablet (2 mg total) by mouth 2 (two) times daily.  60 tablet  2  .  venlafaxine XR (EFFEXOR-XR) 150 MG 24 hr capsule Take 1 capsule (150 mg total) by mouth daily.  90 capsule  1  . verapamil (CALAN SR) 120 MG CR tablet Take 1 tablet (120 mg total) by mouth at bedtime.  90 tablet  2   No current facility-administered medications on file prior to visit.    Allergies:  Allergies  Allergen Reactions  . Contrast Media [Iodinated Diagnostic Agents]     Heart stops and breathing stops    ROS:  Out of a complete 14 system review of symptoms, the patient complains only of the following symptoms, and all other reviewed systems are negative.  Headaches  There were no vitals taken for this visit.  PHYSICAL EXAMINATOINS:  Generalized: In no acute distress  Neck: Supple, no carotid bruits   Cardiac: Regular rate rhythm  Pulmonary: Clear to auscultation bilaterally  Musculoskeletal: No deformity  Neurological examination  Mentation: Alert oriented to time, place, history taking, and causual conversation  Cranial nerve II-XII: Pupils were equal round reactive to light extraocular movements were full, visual field were full on confrontational test. facial sensation and strength were normal. hearing was intact to finger rubbing bilaterally. Uvula tongue midline.  head turning and shoulder shrug and were normal and symmetric.Tongue protrusion into cheek strength was normal.  Motor: normal tone, bulk and strength.  Sensory: Intact to fine touch, pinprick, preserved vibratory sensation, and proprioception at toes.  Coordination: Normal finger to nose, heel-to-shin bilaterally there was no truncal ataxia  Gait: Rising up from seated position without assistance, normal stance, without trunk ataxia, moderate stride, good arm swing, smooth turning, able to perform tiptoe, and heel walking without difficulty.   Romberg signs: Negative  Deep tendon reflexes: Brachioradialis 2/2, biceps 2/2, triceps 2/2, patellar 2/2, Achilles 2/2, plantar responses were flexor  bilaterally.  Assessment/Plan:  54 years old Caucasian female, with frequent headaches, with migraine features, came in for Botox injection as migraine prevention. BOTOX injection was performed according to protocol by [employer]. 100 units of BOTOX /2 cc NS.  Total of 155 units, discarded 45 units   Corrugator 2 sites, 5 units Temporalis 8 sites,  40 units  Occipitalis 6 sites, 30 units Cervical Paraspinal, 4 sites, 20 units Trapezius, 8 sites, 40 units  Extra cervical paraspinals 15 units  Patient tolerate the injection well. Will return for repeat injection in 3 months.  Marcial Pacas, M.D. Ph.D.  [facility] [place], [place] [address] Phone: [phone] Fax:      [phone]

## 2014-07-30 ENCOUNTER — Encounter: Payer: Self-pay | Admitting: Oncology

## 2014-07-30 NOTE — Progress Notes (Signed)
Faxed disability form to [employer] @

## 2014-07-31 ENCOUNTER — Ambulatory Visit (HOSPITAL_BASED_OUTPATIENT_CLINIC_OR_DEPARTMENT_OTHER): Payer: BLUE CROSS/BLUE SHIELD

## 2014-07-31 ENCOUNTER — Other Ambulatory Visit: Payer: Self-pay | Admitting: Oncology

## 2014-07-31 ENCOUNTER — Ambulatory Visit (HOSPITAL_BASED_OUTPATIENT_CLINIC_OR_DEPARTMENT_OTHER): Payer: BLUE CROSS/BLUE SHIELD | Admitting: Nurse Practitioner

## 2014-07-31 ENCOUNTER — Other Ambulatory Visit (HOSPITAL_BASED_OUTPATIENT_CLINIC_OR_DEPARTMENT_OTHER): Payer: BLUE CROSS/BLUE SHIELD

## 2014-07-31 ENCOUNTER — Encounter: Payer: Self-pay | Admitting: Nurse Practitioner

## 2014-07-31 VITALS — BP 117/70 | HR 98 | Temp 97.8°F | Resp 20 | Wt 168.7 lb

## 2014-07-31 DIAGNOSIS — C50812 Malignant neoplasm of overlapping sites of left female breast: Secondary | ICD-10-CM

## 2014-07-31 DIAGNOSIS — C50412 Malignant neoplasm of upper-outer quadrant of left female breast: Secondary | ICD-10-CM

## 2014-07-31 DIAGNOSIS — Z5112 Encounter for antineoplastic immunotherapy: Secondary | ICD-10-CM

## 2014-07-31 DIAGNOSIS — Z5111 Encounter for antineoplastic chemotherapy: Secondary | ICD-10-CM

## 2014-07-31 DIAGNOSIS — Z171 Estrogen receptor negative status [ER-]: Secondary | ICD-10-CM

## 2014-07-31 DIAGNOSIS — G4700 Insomnia, unspecified: Secondary | ICD-10-CM

## 2014-07-31 LAB — CBC WITH DIFFERENTIAL/PLATELET
BASO%: 0.4 % (ref 0.0–2.0)
Basophils Absolute: 0 10*3/uL (ref 0.0–0.1)
EOS%: 0 % (ref 0.0–7.0)
Eosinophils Absolute: 0 10*3/uL (ref 0.0–0.5)
HCT: 28.8 % — ABNORMAL LOW (ref 34.8–46.6)
HGB: 9.6 g/dL — ABNORMAL LOW (ref 11.6–15.9)
LYMPH%: 8.7 % — ABNORMAL LOW (ref 14.0–49.7)
MCH: 33.4 pg (ref 25.1–34.0)
MCHC: 33.3 g/dL (ref 31.5–36.0)
MCV: 100.3 fL (ref 79.5–101.0)
MONO#: 0.4 10*3/uL (ref 0.1–0.9)
MONO%: 6.2 % (ref 0.0–14.0)
NEUT#: 4.8 10*3/uL (ref 1.5–6.5)
NEUT%: 84.7 % — ABNORMAL HIGH (ref 38.4–76.8)
Platelets: 216 10*3/uL (ref 145–400)
RBC: 2.87 10*6/uL — ABNORMAL LOW (ref 3.70–5.45)
RDW: 19.3 % — ABNORMAL HIGH (ref 11.2–14.5)
WBC: 5.7 10*3/uL (ref 3.9–10.3)
lymph#: 0.5 10*3/uL — ABNORMAL LOW (ref 0.9–3.3)

## 2014-07-31 MED ORDER — DIPHENHYDRAMINE HCL 25 MG PO CAPS
ORAL_CAPSULE | ORAL | Status: AC
  Filled 2014-07-31: qty 1

## 2014-07-31 MED ORDER — HYDROCODONE-ACETAMINOPHEN 5-325 MG PO TABS: 1.0000 | ORAL_TABLET | Freq: Four times a day (QID) | ORAL | Status: DC | PRN

## 2014-07-31 MED ORDER — DOCETAXEL CHEMO INJECTION 160 MG/16ML
75.0000 mg/m2 | Freq: Once | INTRAVENOUS | Status: AC
  Administered 2014-07-31: 140 mg via INTRAVENOUS
  Filled 2014-07-31: qty 14

## 2014-07-31 MED ORDER — ACETAMINOPHEN 325 MG PO TABS
650.0000 mg | ORAL_TABLET | Freq: Once | ORAL | Status: AC
  Administered 2014-07-31: 650 mg via ORAL

## 2014-07-31 MED ORDER — SODIUM CHLORIDE 0.9 % IV SOLN
579.0000 mg | Freq: Once | INTRAVENOUS | Status: AC
  Administered 2014-07-31: 580 mg via INTRAVENOUS
  Filled 2014-07-31: qty 58

## 2014-07-31 MED ORDER — HEPARIN SOD (PORK) LOCK FLUSH 100 UNIT/ML IV SOLN
500.0000 [IU] | Freq: Once | INTRAVENOUS | Status: AC | PRN
  Administered 2014-07-31: 500 [IU]
  Filled 2014-07-31: qty 5

## 2014-07-31 MED ORDER — TRASTUZUMAB CHEMO INJECTION 440 MG
6.0000 mg/kg | Freq: Once | INTRAVENOUS | Status: AC
  Administered 2014-07-31: 462 mg via INTRAVENOUS
  Filled 2014-07-31: qty 22

## 2014-07-31 MED ORDER — DEXAMETHASONE SODIUM PHOSPHATE 20 MG/5ML IJ SOLN
20.0000 mg | Freq: Once | INTRAMUSCULAR | Status: AC
  Administered 2014-07-31: 20 mg via INTRAVENOUS

## 2014-07-31 MED ORDER — ONDANSETRON 16 MG/50ML IVPB (CHCC)
INTRAVENOUS | Status: AC
Start: 2014-07-31 — End: 2014-07-31
  Filled 2014-07-31: qty 16

## 2014-07-31 MED ORDER — ONDANSETRON 16 MG/50ML IVPB (CHCC)
16.0000 mg | Freq: Once | INTRAVENOUS | Status: AC
  Administered 2014-07-31: 16 mg via INTRAVENOUS

## 2014-07-31 MED ORDER — SODIUM CHLORIDE 0.9 % IJ SOLN
10.0000 mL | INTRAMUSCULAR | Status: DC | PRN
  Administered 2014-07-31: 10 mL
  Filled 2014-07-31: qty 10

## 2014-07-31 MED ORDER — DEXAMETHASONE SODIUM PHOSPHATE 20 MG/5ML IJ SOLN
INTRAMUSCULAR | Status: AC
Start: 2014-07-31 — End: 2014-07-31
  Filled 2014-07-31: qty 5

## 2014-07-31 MED ORDER — ACETAMINOPHEN 325 MG PO TABS
ORAL_TABLET | ORAL | Status: AC
  Filled 2014-07-31: qty 2

## 2014-07-31 MED ORDER — DIPHENHYDRAMINE HCL 25 MG PO CAPS
25.0000 mg | ORAL_CAPSULE | Freq: Once | ORAL | Status: AC
  Administered 2014-07-31: 25 mg via ORAL

## 2014-07-31 MED ORDER — SODIUM CHLORIDE 0.9 % IV SOLN
Freq: Once | INTRAVENOUS | Status: AC
  Administered 2014-07-31: 14:00:00 via INTRAVENOUS

## 2014-07-31 NOTE — Progress Notes (Signed)
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 She brought it to her primary physician's attention him a and on 02/21/2014 she underwent bilateral diagnostic mammography with tomography at [facility]. The breast composition was category B. In the left breast at the 3:00 position there was an irregular mass associated with skin retraction. Ultrasound performed on the same day confirmed a 1.1 cm irregular solid mass in the left breast at the 3:00 position. There were no abnormalities by sonography in the left axilla or in the superior portion of the breast, which is for the patient experiences some tenderness.  Biopsy of the left breast area in question 02/22/2014 showed ([id number]) and invasive ductal carcinoma, grade 3, estrogen and progesterone receptor negative, with an MIB-1 of 83%, and HER-2 amplified, the signals ratio being 2.07, and a copy number per cell 4.75.  On 02/28/2014 the patient underwent bilateral breast MRI, which showed in the posterior third of the left breast at the 3:00 position an irregular spiculated mass measuring 1.8 cm. The left breast was unremarkable and there were no abnormal appearing lymph nodes.  The patient's subsequent history is as  detailed below  INTERVAL HISTORY: Stacy Fernandez returns today for follow up of her breast cancer accompanied by her husband Stacy Fernandez. Today is day 1 cycle 5 of 6 planned cycles of cyclophosphamide, docetaxel and trastuzumab, which she receives every 21 days, with Neulasta on day 2.  REVIEW OF SYSTEMS:  Stacy Fernandez is ready for chemo to be over. She denies fever or chills. Her nausea is managed well with her PRN antiemetics. She is moving her bowels well. She was put on macrobid for 10 days to treat some urinary symptoms including flank pain and frequency. She states she has not noticed much difference, but she has not seen any more blood. She is eating as best she can. She was sleeping well with melatonin, but stopped after her niece sent her an article on Facebook warning about the dangers of this supplement. She mostly has leg and lower back pain that keeps her up, possibly from the neulasta injection. She uses her hydrocodone rarely. She is retaining fluids, which results in the swelling of her arms and legs. Per Stacy Fernandez, Dr. Jana Hakim is hesitant to prescribed lasix because her blood pressure can be in the 90s on its own at times. She received her botox treatments for her migraines last week which are controlling her headaches. Her energy level is fair. She denies mouth sores or rashes. A detailed review of systems is otherwise negative.   PAST MEDICAL HISTORY: Past Medical History  Diagnosis Date  . Hypothyroidism   . Shortness of breath     2D ECHO, 04/20/2012 - EF >55%, normal; EXERCISE STRESS TEST, 04/20/2012 - normal, EKG negative for ischemia, no ECG changes  . COPD (chronic obstructive pulmonary disease)   . Emphysema   .  Hematuria     cause unknow- saw Urologist  . Hot flashes   . Anginal pain     Dr Claiborne Billings cardiologist- test normal.  " think it is from breast cancer"  . Brain aneurysm      Dr Jannifer Franklin neurologist is monitoring  . Anxiety     Due to situation, CA breast  . Chronic bronchitis     "I've had  it quite a few times" (04/02/2014)  . GERD (gastroesophageal reflux disease)   . Migraine     "treated w/botox for them" (04/02/2014)    PAST SURGICAL HISTORY: Past Surgical History  Procedure Laterality Date  . Cholecystectomy    . Appendectomy    . Laparoscopic nissen fundoplication  11/4006    for GERD- not a problem  . Video bronchoscopy Bilateral 01/16/2013    Procedure: VIDEO BRONCHOSCOPY WITHOUT FLUORO;  Surgeon: [name], MD;  Location: [facility];  Service: Cardiopulmonary;  Laterality: Bilateral;  . Mastectomy complete / simple w/ sentinel node biopsy Left 04/02/2014    axillary SLN  . Mastectomy complete / simple Right 04/02/2014    PROPHYLACTIC  . Portacath placement Right 04/02/2014  . Breast biopsy Left 11/2013 X 3  . Total abdominal hysterectomy    . Cardiac catheterization  2014  . Simple mastectomy with axillary sentinel node biopsy Bilateral 04/02/2014    Procedure: LEFT TOTAL MASTECTOMY WITH LEFT AXILLARY SENTINEL NODE BIOPSY, RIGHT PROPHYLACTIC MASTETCTOMY;  Surgeon: [name], MD;  Location: [place];  Service: General;  Laterality: Bilateral;  . Portacath placement N/A 04/02/2014    Procedure: INSERTION PORT-A-CATH;  Surgeon: [name], MD;  Location: [place];  Service: General;  Laterality: N/A;  . Left heart catheterization with coronary angiogram N/A 12/26/2012    Procedure: LEFT HEART CATHETERIZATION WITH CORONARY ANGIOGRAM;  Surgeon: Lorretta Harp, MD;  Location: [facility] CATH LAB;  Service: Cardiovascular;  Laterality: N/A;    FAMILY HISTORY Family History  Problem Relation Age of Onset  . Heart failure Mother   . Colon cancer Father 68    stomach cancer also in 81s  . Throat cancer Brother 67    smoker  . Heart attack Maternal Grandmother   . Colon cancer Paternal Grandmother 42  . Cancer Paternal Grandfather     kidney and bladder  . Throat cancer Brother 37    throat cancer, smoker  . Ovarian cancer Sister 32    ovarian cancer at 62,  colorectal cancer at 25  . Breast cancer Paternal Aunt 48  . Ovarian cancer Other 58    niece with ovarian cancer   the patient's father died at the age of 65 from metastatic stomach cancer the patient's father's mother died from colon cancer at the age of 81. The patient's mother died at the age of 48. The patient had 2 brothers and 2 sisters. One brother died at the age of 47 from throat cancer metastatic to the lung. He was a smoker. A second brother Was diagnosed at age 52 with throat cancer metastatic to lung. He has been given 90 days to live" is not looking good". One sister died from colon cancer metastatic to bone. One sister died of a drug overdose. There is no history of breast or bearing cancer in the family to the patient's knowledge.  GYNECOLOGIC HISTORY:  No LMP recorded. Patient has had a hysterectomy. Menarche age 89, first live birth age 71, the patient is [place] P4. She underwent hysterectomy with bilateral salpingo-oophorectomy approximately  1990. She took hormone replacement for approximately 2 years.  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    ADVANCED DIRECTIVES: Not in place   HEALTH MAINTENANCE: History  Substance Use Topics  . Smoking status: Former Smoker -- 0.00 packs/day for 37 years    Quit date: 03/29/2014  . Smokeless tobacco: Never Used     Comment: uses e-cig 01/03/13, starting using Chantix 11/13/13  . Alcohol Use: No     Colonoscopy: August 2015  PAP: May 2013  Bone density:  Lipid panel:  Allergies  Allergen Reactions  . Contrast Media [Iodinated Diagnostic Agents] Anaphylaxis    Heart stops and breathing stops; patient ok with 13 hour prep    Current Outpatient Prescriptions  Medication Sig Dispense Refill  . botulinum toxin Type A (BOTOX) 100 UNITS SOLR injection 150 Units every 3 (three) months. Next injection date in Jan  2016.    Marland Kitchen buPROPion (WELLBUTRIN XL) 150 MG 24 hr tablet Take 300 mg by mouth daily.     Marland Kitchen dexamethasone (DECADRON) 4 MG tablet Take 2 tablets (8 mg total) by mouth 2 (two) times daily. Start the day before Taxotere. Then again the day after chemo for 3 days. 30 tablet 1  . gabapentin (NEURONTIN) 300 MG capsule Take 1 capsule (300 mg total) by mouth 4 (four) times daily. (Patient taking differently: Take 300 mg by mouth 2 (two) times daily. ) 120 capsule 6  . HYDROcodone-acetaminophen (NORCO/VICODIN) 5-325 MG per tablet Take 1 tablet by mouth every 6 (six) hours as needed for moderate pain. 60 tablet 0  . ipratropium-albuterol (DUONEB) 0.5-2.5 (3) MG/3ML SOLN Take 3 mLs by nebulization every 6 (six) hours as needed.    Marland Kitchen levothyroxine (SYNTHROID, LEVOTHROID) 75 MCG tablet Take 75 mcg by mouth daily before breakfast.    . lidocaine-prilocaine (EMLA) cream Apply 1 application topically as needed. Apply over port area 1-2 hours before chemo and cover with plastic wrap 30 g 0  . LORazepam (ATIVAN) 0.5 MG tablet Take 1 tablet (0.5 mg total) by mouth at bedtime as needed (Nausea or vomiting). 30 tablet 0  . montelukast (SINGULAIR) 10 MG tablet Take 10 mg by mouth at bedtime.     . ondansetron (ZOFRAN) 8 MG tablet Take 1 tablet (8 mg total) by mouth 2 (two) times daily. 60 tablet 1  . PROAIR HFA 108 (90 BASE) MCG/ACT inhaler Inhale 2 puffs into the lungs every 4 (four) hours as needed for wheezing or shortness of breath.     . rizatriptan (MAXALT-MLT) 10 MG disintegrating tablet Take 1 tablet (10 mg total) by mouth as needed for migraine. May repeat in 2 hours if needed 15 tablet 11  . SUMAtriptan (IMITREX) 6 MG/0.5ML SOLN injection Inject 6 mg into the skin every 2 (two) hours as needed for migraine or headache. May repeat in 2 hours if headache persists or recurs. Max 2 doses in 24 hours    . tobramycin-dexamethasone (TOBRADEX) ophthalmic solution Place 1 drop into both eyes every 4 (four) hours while  awake. 5 mL 0  . verapamil (CALAN-SR) 120 MG CR tablet TAKE 1 TABLET BY MOUTH AT BEDTIME ( MAX 30 DAY SUPPLY PER INSURANCE) 30 tablet 11  . cholestyramine (QUESTRAN) 4 G packet Take 1 packet (4 g total) by mouth 3 (three) times daily as needed. (Patient not taking: Reported on 07/31/2014) 60 each  12  . nystatin (MYCOSTATIN) 100000 UNIT/ML suspension Take 5 mLs (500,000 Units total) by mouth 4 (four) times daily. Swish and swallow (Patient not taking: Reported on 07/10/2014) 240 mL 1  . predniSONE (DELTASONE) 50 MG tablet 1 tablet 13 hours prior to CT scan, 7 hours prior, and 1 hour prior (Patient not taking: Reported on 07/31/2014) 3 tablet 0  . prochlorperazine (COMPAZINE) 10 MG tablet Take 1 tablet (10 mg total) by mouth every 6 (six) hours as needed (Nausea or vomiting). (Patient not taking: Reported on 07/31/2014) 30 tablet 1  . tiZANidine (ZANAFLEX) 2 MG tablet Take 1 tablet (2 mg total) by mouth 2 (two) times daily as needed for muscle spasms. (Patient not taking: Reported on 07/10/2014) 60 tablet 11  . traZODone (DESYREL) 100 MG tablet Take 1 tablet (100 mg total) by mouth at bedtime. (Patient not taking: Reported on 07/31/2014) 30 tablet 2   Current Facility-Administered Medications  Medication Dose Route Frequency Provider Last Rate Last Dose  . botulinum toxin Type A (BOTOX) injection 150 Units  150 Units Intramuscular Once Marcial Pacas, MD        OBJECTIVE: Middle-aged white woman who appears stated age 54 Vitals:   07/31/14 1131  BP: 117/70  Pulse: 98  Temp: 97.8 F (36.6 C)  Resp: 20     Body mass index is 27.23 kg/(m^2).    ECOG FS:1 - Symptomatic but completely ambulatory  Skin: warm, dry  HEENT: sclerae anicteric, conjunctivae pink, oropharynx clear. No thrush or mucositis.  Lymph Nodes: No cervical or supraclavicular lymphadenopathy  Lungs: clear to auscultation bilaterally, no rales, wheezes, or rhonci  Heart: regular rate and rhythm  Abdomen: round, soft, non tender,  positive bowel sounds  Musculoskeletal: No focal spinal tenderness, +2 edema to bilateral lower extremities, +1 edema to bilateral upper extremities Neuro: non focal, well oriented, positive affect  Breasts: deferred  LAB RESULTS:  CMP     Component Value Date/Time   NA 134* 07/17/2014 0911   NA 142 04/03/2014 0420   K 4.5 07/17/2014 0911   K 4.1 04/03/2014 0420   CL 106 04/03/2014 0420   CO2 21* 07/17/2014 0911   CO2 19 04/03/2014 0420   GLUCOSE 110 07/17/2014 0911   GLUCOSE 162* 04/03/2014 0420   BUN 9.1 07/17/2014 0911   BUN 8 04/03/2014 0420   CREATININE 0.8 07/17/2014 0911   CREATININE 0.71 04/03/2014 0420   CALCIUM 9.0 07/17/2014 0911   CALCIUM 8.6 04/03/2014 0420   PROT 6.5 07/17/2014 0911   PROT 6.5 04/02/2014 0804   ALBUMIN 3.8 07/17/2014 0911   ALBUMIN 3.4* 04/02/2014 0804   AST 8 07/17/2014 0911   AST 20 04/02/2014 0804   ALT 11 07/17/2014 0911   ALT 21 04/02/2014 0804   ALKPHOS 110 07/17/2014 0911   ALKPHOS 112 04/02/2014 0804   BILITOT 0.56 07/17/2014 0911   BILITOT 0.3 04/02/2014 0804   GFRNONAA >90 04/03/2014 0420   GFRAA >90 04/03/2014 0420    I No results found for: SPEP  Lab Results  Component Value Date   WBC 5.7 07/31/2014   NEUTROABS 4.8 07/31/2014   HGB 9.6* 07/31/2014   HCT 28.8* 07/31/2014   MCV 100.3 07/31/2014   PLT 216 07/31/2014      Chemistry      Component Value Date/Time   NA 134* 07/17/2014 0911   NA 142 04/03/2014 0420   K 4.5 07/17/2014 0911   K 4.1 04/03/2014 0420   CL 106 04/03/2014 0420  CO2 21* 07/17/2014 0911   CO2 19 04/03/2014 0420   BUN 9.1 07/17/2014 0911   BUN 8 04/03/2014 0420   CREATININE 0.8 07/17/2014 0911   CREATININE 0.71 04/03/2014 0420      Component Value Date/Time   CALCIUM 9.0 07/17/2014 0911   CALCIUM 8.6 04/03/2014 0420   ALKPHOS 110 07/17/2014 0911   ALKPHOS 112 04/02/2014 0804   AST 8 07/17/2014 0911   AST 20 04/02/2014 0804   ALT 11 07/17/2014 0911   ALT 21 04/02/2014 0804    BILITOT 0.56 07/17/2014 0911   BILITOT 0.3 04/02/2014 0804       No results found for: LABCA2  No components found for: LABCA125  No results for input(s): INR in the last 168 hours.  Urinalysis    Component Value Date/Time   COLORURINE YELLOW 12/25/2012 2225   APPEARANCEUR CLOUDY* 12/25/2012 2225   LABSPEC 1.010 07/10/2014 1004   LABSPEC 1.027 12/25/2012 2225   PHURINE 5.0 12/25/2012 2225   GLUCOSEU Negative 07/10/2014 1004   GLUCOSEU NEGATIVE 12/25/2012 2225   HGBUR LARGE* 12/25/2012 2225   BILIRUBINUR NEGATIVE 12/25/2012 2225   KETONESUR NEGATIVE 12/25/2012 2225   PROTEINUR NEGATIVE 12/25/2012 2225   UROBILINOGEN 0.2 07/10/2014 1004   UROBILINOGEN 0.2 12/25/2012 2225   NITRITE NEGATIVE 12/25/2012 2225   LEUKOCYTESUR TRACE* 12/25/2012 2225    STUDIES: Ct Abdomen Pelvis W Contrast  07/17/2014   CLINICAL DATA:  Bilateral low back pain. Hematuria. Urinary frequency. Appendectomy. Cholecystectomy. Breast cancer in August. Bilateral mastectomies. Chemotherapy in progress. Possible kidney stones. ICD10: R 35.0  EXAM: CT ABDOMEN AND PELVIS WITH CONTRAST  TECHNIQUE: Multidetector CT imaging of the abdomen and pelvis was performed using the standard protocol following bolus administration of intravenous contrast.  CONTRAST:  127m OMNIPAQUE IOHEXOL 300 MG/ML  SOLN  COMPARISON:  PET 03/12/2014.  Prior diagnostic CT of 04/28/2012.  FINDINGS: Lower chest: Minimal atelectasis at the right lung base. Normal heart size without pericardial or pleural effusion. Surgical changes at the gastroesophageal junction.  Hepatobiliary: Normal liver. Cholecystectomy, without biliary ductal dilatation.  Pancreas: Normal, without mass or pancreatic ductal dilatation.  Spleen: Normal  Adrenals/Urinary Tract: Normal adrenal glands. No renal calculi or hydronephrosis. Too small to characterize lesions in the left kidney. No hydroureter. The urinary bladder extends superiorly and anteriorly towards the umbilicus,  similar to on the prior PET. There is gas within this portion of the bladder, including on image 66 of series 2 and image 61 sagittal.  Stomach/Bowel: Normal stomach. The cecum is positioned within the right paracentral pelvis. Normal terminal ileum. Appendix not visualized.  The proximal transverse duodenum is mildly prominent on image 34 of series 2. More distally, it is normal in caliber. Small bowel is otherwise unremarkable.  Vascular/Lymphatic: Aortic and branch vessel atherosclerosis. No abdominopelvic adenopathy.  Reproductive: Hysterectomy.  No adnexal mass.  Other:  No significant free fluid.  Musculoskeletal: No focal osseous abnormality.  IMPRESSION: 1. No evidence of urinary tract calculi or urinary tract obstruction. Sensitivity for small stones is slightly decreased secondary to post-contrast technique. 2. Prominence of the proximal transverse duodenum. This could be incidental/transient (not persistent on kidney delayed images). In the appropriate clinical setting, a component of superior mesenteric artery syndrome could have a similar appearance. 3. Anterior and cephalad extension of the urinary bladder. This could be secondary to scarring from prior surgery or a urachal remnant (urachal diverticulum). There is gas within this extension. If the patient has undergone recent instrumentation, this is likely iatrogenic. If  not, infection would be a consideration. 4. Advanced atherosclerosis for age. 5. No evidence of metastatic disease.   Electronically Signed   By: Abigail Miyamoto M.D.   On: 07/17/2014 09:41     ASSESSMENT: 54 y.o. BRCA negative [place] woman status post left breast biopsy 02/22/2014 for a clinical T2/T3 NX, stage II or 3 invasive ductal carcinoma, grade 3, estrogen and progesterone receptor negative, with an MIB-1 of 83%, and HER-2 amplified with a signals a ratio of 2.07and a copy number per cell of 4.75  (1) biopsy of an additional area of enhancement in the left breast  03/08/2014 showed ductal carcinoma in situ, estrogen and progesterone receptor negative.  (2) status post bilateral mastectomies with left sentinel lymph node sampling 04/02/2014, showing:  (a) on the right, benign breast tissue including a single negative lymph node  (b) On  the left, a  pT1c pN0, stage IA invasive ductal carcinoma, grade 3, with negative margins  (3) adjuvant chemotherapy started 05/08/2014, consisting of carboplatin and docetaxel given every 3 weeks x6, together with trastuzumab, with neulasta day 2   (4) trastuzumab to be continued to complete a year (through OCT 2016) Most recent echocardiogram 03/21/2014 showed a normal ejection fraction  (5) reconstruction to follow after completion of chemotherapy ([place])  (6) flank pain and coag-negative staph in urine: to start nitrofurantoin 07/17/2014  (7) Hx of smoking: The patient quit smoking 03/30/2014  PLAN: Stacy Fernandez is doing moderately well today. The labs were reviewed in detail and were entirely stable. She will proceed with day 1, cycle 5 of [place] today. She had a repeat echocardiogram on 07/24/14 that showed an EF of 60-65%. This be due next in April.   I have refilled her hydrocodone today, and advised that she use this more often for her leg/back pain, especially at night if it is keeping her up. She will resume the melatonin nightly as it has been helpful to her in the past.   Stacy Fernandez will return next week for labs and a nadir visit. She understands and agrees with this plan. She knows the goal of treatment in her case is cure. She has been encouraged to call with any issues that might arise before her next visit here.   Marcelino Duster, NP   07/31/2014 12:12 PM

## 2014-07-31 NOTE — Patient Instructions (Signed)
[facility] Discharge Instructions for Patients Receiving Chemotherapy  Today you received the following chemotherapy agents:  Taxotere, Carboplatin and Herceptin  To help prevent nausea and vomiting after your treatment, we encourage you to take your nausea medication as ordered per MD.   If you develop nausea and vomiting that is not controlled by your nausea medication, call the clinic.   BELOW ARE SYMPTOMS THAT SHOULD BE REPORTED IMMEDIATELY:  *FEVER GREATER THAN 100.5 F  *CHILLS WITH OR WITHOUT FEVER  NAUSEA AND VOMITING THAT IS NOT CONTROLLED WITH YOUR NAUSEA MEDICATION  *UNUSUAL SHORTNESS OF BREATH  *UNUSUAL BRUISING OR BLEEDING  TENDERNESS IN MOUTH AND THROAT WITH OR WITHOUT PRESENCE OF ULCERS  *URINARY PROBLEMS  *BOWEL PROBLEMS  UNUSUAL RASH Items with * indicate a potential emergency and should be followed up as soon as possible.  Feel free to call the clinic you have any questions or concerns. The clinic phone number is [phone].

## 2014-08-01 ENCOUNTER — Encounter: Payer: Self-pay | Admitting: Oncology

## 2014-08-01 ENCOUNTER — Ambulatory Visit (HOSPITAL_BASED_OUTPATIENT_CLINIC_OR_DEPARTMENT_OTHER): Payer: BLUE CROSS/BLUE SHIELD

## 2014-08-01 DIAGNOSIS — C50812 Malignant neoplasm of overlapping sites of left female breast: Secondary | ICD-10-CM

## 2014-08-01 DIAGNOSIS — Z5189 Encounter for other specified aftercare: Secondary | ICD-10-CM

## 2014-08-01 DIAGNOSIS — C50412 Malignant neoplasm of upper-outer quadrant of left female breast: Secondary | ICD-10-CM

## 2014-08-01 MED ORDER — PEGFILGRASTIM INJECTION 6 MG/0.6ML ~~LOC~~
6.0000 mg | PREFILLED_SYRINGE | Freq: Once | SUBCUTANEOUS | Status: AC
  Administered 2014-08-01: 6 mg via SUBCUTANEOUS
  Filled 2014-08-01: qty 0.6

## 2014-08-01 NOTE — Progress Notes (Signed)
Faxed disability form to [facility] @ [phone]

## 2014-08-01 NOTE — Patient Instructions (Signed)
Pegfilgrastim injection What is this medicine? PEGFILGRASTIM (peg fil GRA stim) is a long-acting granulocyte colony-stimulating factor that stimulates the growth of neutrophils, a type of white blood cell important in the body's fight against infection. It is used to reduce the incidence of fever and infection in patients with certain types of cancer who are receiving chemotherapy that affects the bone marrow. This medicine may be used for other purposes; ask your health care provider or pharmacist if you have questions. COMMON BRAND NAME(S): Neulasta What should I tell my health care provider before I take this medicine? They need to know if you have any of these conditions: -latex allergy -ongoing radiation therapy -sickle cell disease -skin reactions to acrylic adhesives (On-Body Injector only) -an unusual or allergic reaction to pegfilgrastim, filgrastim, other medicines, foods, dyes, or preservatives -pregnant or trying to get pregnant -breast-feeding How should I use this medicine? This medicine is for injection under the skin. If you get this medicine at home, you will be taught how to prepare and give the pre-filled syringe or how to use the On-body Injector. Refer to the patient Instructions for Use for detailed instructions. Use exactly as directed. Take your medicine at regular intervals. Do not take your medicine more often than directed. It is important that you put your used needles and syringes in a special sharps container. Do not put them in a trash can. If you do not have a sharps container, call your pharmacist or healthcare provider to get one. Talk to your pediatrician regarding the use of this medicine in children. Special care may be needed. Overdosage: If you think you have taken too much of this medicine contact a poison control center or emergency room at once. NOTE: This medicine is only for you. Do not share this medicine with others. What if I miss a dose? It is  important not to miss your dose. Call your doctor or health care professional if you miss your dose. If you miss a dose due to an On-body Injector failure or leakage, a new dose should be administered as soon as possible using a single prefilled syringe for manual use. What may interact with this medicine? Interactions have not been studied. Give your health care provider a list of all the medicines, herbs, non-prescription drugs, or dietary supplements you use. Also tell them if you smoke, drink alcohol, or use illegal drugs. Some items may interact with your medicine. This list may not describe all possible interactions. Give your health care provider a list of all the medicines, herbs, non-prescription drugs, or dietary supplements you use. Also tell them if you smoke, drink alcohol, or use illegal drugs. Some items may interact with your medicine. What should I watch for while using this medicine? You may need blood work done while you are taking this medicine. If you are going to need a MRI, CT scan, or other procedure, tell your doctor that you are using this medicine (On-Body Injector only). What side effects may I notice from receiving this medicine? Side effects that you should report to your doctor or health care professional as soon as possible: -allergic reactions like skin rash, itching or hives, swelling of the face, lips, or tongue -dizziness -fever -pain, redness, or irritation at site where injected -pinpoint red spots on the skin -shortness of breath or breathing problems -stomach or side pain, or pain at the shoulder -swelling -tiredness -trouble passing urine Side effects that usually do not require medical attention (report to your doctor   or health care professional if they continue or are bothersome): -bone pain -muscle pain This list may not describe all possible side effects. Call your doctor for medical advice about side effects. You may report side effects to FDA at  1-800-FDA-1088. Where should I keep my medicine? Keep out of the reach of children. Store pre-filled syringes in a refrigerator between 2 and 8 degrees C (36 and 46 degrees F). Do not freeze. Keep in carton to protect from light. Throw away this medicine if it is left out of the refrigerator for more than 48 hours. Throw away any unused medicine after the expiration date. NOTE: This sheet is a summary. It may not cover all possible information. If you have questions about this medicine, talk to your doctor, pharmacist, or health care provider.  2015, Elsevier/Gold Standard. (2013-10-05 16:14:05)  

## 2014-08-07 ENCOUNTER — Ambulatory Visit (HOSPITAL_BASED_OUTPATIENT_CLINIC_OR_DEPARTMENT_OTHER): Payer: BLUE CROSS/BLUE SHIELD | Admitting: Nurse Practitioner

## 2014-08-07 ENCOUNTER — Encounter: Payer: Self-pay | Admitting: Nurse Practitioner

## 2014-08-07 ENCOUNTER — Telehealth: Payer: Self-pay | Admitting: Nurse Practitioner

## 2014-08-07 ENCOUNTER — Other Ambulatory Visit (HOSPITAL_BASED_OUTPATIENT_CLINIC_OR_DEPARTMENT_OTHER): Payer: BLUE CROSS/BLUE SHIELD

## 2014-08-07 VITALS — BP 110/65 | HR 103 | Temp 98.3°F | Resp 18 | Ht 66.0 in | Wt 163.3 lb

## 2014-08-07 DIAGNOSIS — C50912 Malignant neoplasm of unspecified site of left female breast: Secondary | ICD-10-CM

## 2014-08-07 DIAGNOSIS — T451X5A Adverse effect of antineoplastic and immunosuppressive drugs, initial encounter: Secondary | ICD-10-CM

## 2014-08-07 DIAGNOSIS — J449 Chronic obstructive pulmonary disease, unspecified: Secondary | ICD-10-CM

## 2014-08-07 DIAGNOSIS — D701 Agranulocytosis secondary to cancer chemotherapy: Secondary | ICD-10-CM

## 2014-08-07 DIAGNOSIS — G620 Drug-induced polyneuropathy: Secondary | ICD-10-CM

## 2014-08-07 DIAGNOSIS — E039 Hypothyroidism, unspecified: Secondary | ICD-10-CM

## 2014-08-07 DIAGNOSIS — C50412 Malignant neoplasm of upper-outer quadrant of left female breast: Secondary | ICD-10-CM

## 2014-08-07 DIAGNOSIS — C50419 Malignant neoplasm of upper-outer quadrant of unspecified female breast: Secondary | ICD-10-CM

## 2014-08-07 LAB — COMPREHENSIVE METABOLIC PANEL (CC13)
ALT: 9 U/L (ref 0–55)
AST: 10 U/L (ref 5–34)
Albumin: 3.9 g/dL (ref 3.5–5.0)
Alkaline Phosphatase: 101 U/L (ref 40–150)
Anion Gap: 8 mEq/L (ref 3–11)
BUN: 13.6 mg/dL (ref 7.0–26.0)
CO2: 25 mEq/L (ref 22–29)
Calcium: 8.7 mg/dL (ref 8.4–10.4)
Chloride: 102 mEq/L (ref 98–109)
Creatinine: 0.9 mg/dL (ref 0.6–1.1)
EGFR: 71 mL/min/{1.73_m2} — ABNORMAL LOW (ref >90)
Glucose: 87 mg/dl (ref 70–140)
Potassium: 3.7 mEq/L (ref 3.5–5.1)
Sodium: 136 mEq/L (ref 136–145)
Total Bilirubin: 0.86 mg/dL (ref 0.20–1.20)
Total Protein: 6.6 g/dL (ref 6.4–8.3)

## 2014-08-07 LAB — CBC WITH DIFFERENTIAL/PLATELET
BASO%: 0.5 % (ref 0.0–2.0)
Basophils Absolute: 0 10*3/uL (ref 0.0–0.1)
EOS ABS: 0 10*3/uL (ref 0.0–0.5)
EOS%: 0.7 % (ref 0.0–7.0)
HCT: 30 % — ABNORMAL LOW (ref 34.8–46.6)
HGB: 9.9 g/dL — ABNORMAL LOW (ref 11.6–15.9)
LYMPH%: 43.1 % (ref 14.0–49.7)
MCH: 33.7 pg (ref 25.1–34.0)
MCHC: 33.2 g/dL (ref 31.5–36.0)
MCV: 101.5 fL — ABNORMAL HIGH (ref 79.5–101.0)
MONO#: 0.3 10*3/uL (ref 0.1–0.9)
MONO%: 19 % — ABNORMAL HIGH (ref 0.0–14.0)
NEUT#: 0.7 10*3/uL — ABNORMAL LOW (ref 1.5–6.5)
NEUT%: 36.7 % — ABNORMAL LOW (ref 38.4–76.8)
Platelets: 156 10*3/uL (ref 145–400)
RBC: 2.95 10*6/uL — ABNORMAL LOW (ref 3.70–5.45)
RDW: 16.6 % — ABNORMAL HIGH (ref 11.2–14.5)
WBC: 1.8 10*3/uL — ABNORMAL LOW (ref 3.9–10.3)
lymph#: 0.8 10*3/uL — ABNORMAL LOW (ref 0.9–3.3)

## 2014-08-07 MED ORDER — DEXAMETHASONE 4 MG PO TABS: 8.0000 mg | ORAL_TABLET | Freq: Two times a day (BID) | ORAL | Status: DC

## 2014-08-07 NOTE — Telephone Encounter (Signed)
per pof to sch pt appt-sent MW email to sch pt trmt-pt aware of lab & appt

## 2014-08-07 NOTE — Progress Notes (Signed)
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 She brought it to her primary physician's attention him a and on 02/21/2014 she underwent bilateral diagnostic mammography with tomography at [facility]. The breast composition was category B. In the left breast at the 3:00 position there was an irregular mass associated with skin retraction. Ultrasound performed on the same day confirmed a 1.1 cm irregular solid mass in the left breast at the 3:00 position. There were no abnormalities by sonography in the left axilla or in the superior portion of the breast, which is for the patient experiences some tenderness.  Biopsy of the left breast area in question 02/22/2014 showed ([id number]) and invasive ductal carcinoma, grade 3, estrogen and progesterone receptor negative, with an MIB-1 of 83%, and HER-2 amplified, the signals ratio being 2.07, and a copy number per cell 4.75.  On 02/28/2014 the patient underwent bilateral breast MRI, which showed in the posterior third of the left breast at the 3:00 position an irregular spiculated mass measuring 1.8 cm. The left breast was unremarkable and there were no abnormal appearing lymph nodes.  The patient's subsequent history is as  detailed below  INTERVAL HISTORY: Stacy Fernandez returns today for follow up of her breast cancer accompanied by her husband Stacy Fernandez. Today is day 8 cycle 5 of 6 planned cycles of cyclophosphamide, docetaxel and trastuzumab, which she receives every 21 days, with Neulasta on day 2.  REVIEW OF SYSTEMS:  This is Stacy Fernandez's toughest week yet. She was very tearful during our visit. She just does not feel well. She denies fevers or chills, and her nausea is managed with her PRN antiemetic. She is moving her bowels well. Her main complaints include weakness and fatigue. She has been swishing and swallowing nystatin because her tongue and throat are raw, though there are no defined sores. Magic mouthwash burns. She is barely eating or drinking, but cold foods are soothing. She has new numbness to her left and right toes that extend the entire digit. She still uses hydrocodone for her pain. Luckily the swelling to her arms and legs have resolved. She started back on the melatonin but her sleep isnt as good. She had one migraine this week. A detailed review of systems is otherwise stable.   PAST MEDICAL HISTORY: Past Medical History  Diagnosis Date  . Hypothyroidism   . Shortness of breath     2D ECHO, 04/20/2012 - EF >55%, normal; EXERCISE STRESS TEST, 04/20/2012 - normal, EKG negative for ischemia, no ECG changes  . COPD (chronic obstructive pulmonary disease)   . Emphysema   . Hematuria     cause unknow- saw Urologist  . Hot flashes   . Anginal pain     Dr Stacy Fernandez cardiologist- test normal.  " think it is from breast cancer"  . Brain aneurysm      Dr Stacy Fernandez  neurologist is monitoring  . Anxiety     Due to situation, CA breast  . Chronic bronchitis     "I've had it quite a few times" (04/02/2014)  . GERD (gastroesophageal reflux disease)   . Migraine     "treated w/botox for them" (04/02/2014)    PAST SURGICAL HISTORY: Past Surgical History  Procedure Laterality Date  . Cholecystectomy    . Appendectomy    .  Laparoscopic nissen fundoplication  02/2955    for GERD- not a problem  . Video bronchoscopy Bilateral 01/16/2013    Procedure: VIDEO BRONCHOSCOPY WITHOUT FLUORO;  Surgeon: Stacy Noel, MD;  Location: [facility];  Service: Cardiopulmonary;  Laterality: Bilateral;  . Mastectomy complete / simple w/ sentinel node biopsy Left 04/02/2014    axillary SLN  . Mastectomy complete / simple Right 04/02/2014    PROPHYLACTIC  . Portacath placement Right 04/02/2014  . Breast biopsy Left 11/2013 X 3  . Total abdominal hysterectomy    . Cardiac catheterization  2014  . Simple mastectomy with axillary sentinel node biopsy Bilateral 04/02/2014    Procedure: LEFT TOTAL MASTECTOMY WITH LEFT AXILLARY SENTINEL NODE BIOPSY, RIGHT PROPHYLACTIC MASTETCTOMY;  Surgeon: Stacy Skates, MD;  Location: [facility];  Service: General;  Laterality: Bilateral;  . Portacath placement N/A 04/02/2014    Procedure: INSERTION PORT-A-CATH;  Surgeon: Stacy Skates, MD;  Location: [facility];  Service: General;  Laterality: N/A;  . Left heart catheterization with coronary angiogram N/A 12/26/2012    Procedure: LEFT HEART CATHETERIZATION WITH CORONARY ANGIOGRAM;  Surgeon: Stacy Harp, MD;  Location: [facility] CATH LAB;  Service: Cardiovascular;  Laterality: N/A;    FAMILY HISTORY Family History  Problem Relation Age of Onset  . Heart failure Mother   . Colon cancer Father 50    stomach cancer also in 61s  . Throat cancer Brother 23    smoker  . Heart attack Maternal Grandmother   . Colon cancer Paternal Grandmother 41  . Cancer Paternal Grandfather     kidney and bladder  . Throat cancer Brother 37    throat cancer, smoker  . Ovarian cancer Sister 57    ovarian cancer at 9, colorectal cancer at 38  . Breast cancer Paternal Aunt 70  . Ovarian cancer Other 69    niece with ovarian cancer   the patient's father died at the age of 36 from metastatic stomach cancer the patient's father's mother died from colon cancer at the age of 38. The  patient's mother died at the age of 53. The patient had 2 brothers and 2 sisters. One brother died at the age of 51 from throat cancer metastatic to the lung. He was a smoker. A second brother Was diagnosed at age 56 with throat cancer metastatic to lung. He has been given 90 days to live" is not looking good". One sister died from colon cancer metastatic to bone. One sister died of a drug overdose. There is no history of breast or bearing cancer in the family to the patient's knowledge.  GYNECOLOGIC HISTORY:  No LMP recorded. Patient has had a hysterectomy. Menarche age 10, first live birth age 89, the patient is [place] P4. She underwent hysterectomy with bilateral salpingo-oophorectomy approximately 1990. She took hormone replacement for approximately 2 years.  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    ADVANCED DIRECTIVES: Not in place  HEALTH MAINTENANCE: History  Substance Use Topics  . Smoking status: Former Smoker -- 0.00 packs/day for 37 years    Quit date: 03/29/2014  . Smokeless tobacco: Never Used     Comment: uses e-cig 01/03/13, starting using Chantix 11/13/13  . Alcohol Use: No     Colonoscopy: August 2015  PAP: May 2013  Bone density:  Lipid panel:  Allergies  Allergen Reactions  . Contrast Media [Iodinated Diagnostic Agents] Anaphylaxis    Heart stops and breathing stops; patient ok with 13 hour prep    Current Outpatient Prescriptions  Medication Sig Dispense Refill  . buPROPion (WELLBUTRIN XL) 150 MG 24 hr tablet Take 300 mg by mouth daily.     Marland Kitchen gabapentin (NEURONTIN) 300 MG capsule Take 1 capsule (300 mg total) by mouth 4 (four) times daily. (Patient taking differently: Take 300 mg by mouth 2 (two) times daily. ) 120 capsule 6  . HYDROcodone-acetaminophen (NORCO/VICODIN) 5-325 MG per tablet Take 1 tablet by mouth every 6 (six) hours as needed  for moderate pain. 60 tablet 0  . levothyroxine (SYNTHROID, LEVOTHROID) 75 MCG tablet Take 75 mcg by mouth daily before breakfast.    . lidocaine-prilocaine (EMLA) cream Apply 1 application topically as needed. Apply over port area 1-2 hours before chemo and cover with plastic wrap 30 g 0  . LORazepam (ATIVAN) 0.5 MG tablet Take 1 tablet (0.5 mg total) by mouth at bedtime as needed (Nausea or vomiting). 30 tablet 0  . montelukast (SINGULAIR) 10 MG tablet Take 10 mg by mouth at bedtime.     Marland Kitchen nystatin (MYCOSTATIN) 100000 UNIT/ML suspension Take 5 mLs (500,000 Units total) by mouth 4 (four) times daily. Swish and swallow 240 mL 1  . ondansetron (ZOFRAN) 8 MG tablet Take 1 tablet (8 mg total) by mouth 2 (two) times daily. 60 tablet 1  . rizatriptan (MAXALT-MLT) 10 MG disintegrating tablet Take 1 tablet (10 mg total) by mouth as needed for migraine. May repeat in 2 hours if needed 15 tablet 11  . tobramycin-dexamethasone (TOBRADEX) ophthalmic solution Place 1 drop into both eyes every 4 (four) hours while awake. 5 mL 0  . traZODone (DESYREL) 100 MG tablet Take 1 tablet (100 mg total) by mouth at bedtime. 30 tablet 2  . verapamil (CALAN-SR) 120 MG CR tablet TAKE 1 TABLET BY MOUTH AT BEDTIME ( MAX 30 DAY SUPPLY PER INSURANCE) 30 tablet 11  . botulinum toxin Type A (BOTOX) 100 UNITS SOLR injection 150 Units every 3 (three) months. Next injection date in Jan 2016.    . cholestyramine (QUESTRAN) 4 G packet Take 1 packet (4 g total) by mouth 3 (three) times daily as needed. (Patient not taking: Reported on 07/31/2014) 60 each 12  . dexamethasone (DECADRON) 4 MG tablet Take 2 tablets (8 mg total) by mouth 2 (two) times daily. Start the day before Taxotere. Then again the day after chemo for 3 days. (Patient not taking: Reported on 08/07/2014) 30 tablet 1  . ipratropium-albuterol (DUONEB) 0.5-2.5 (3) MG/3ML SOLN Take 3 mLs by nebulization every 6 (six) hours as needed.    . predniSONE (DELTASONE) 50 MG tablet 1  tablet 13 hours prior to CT scan, 7 hours prior, and 1 hour prior (Patient not taking: Reported on 07/31/2014) 3 tablet 0  . PROAIR HFA 108 (90 BASE) MCG/ACT inhaler Inhale 2 puffs into the lungs every 4 (four) hours as needed for wheezing or shortness of breath.     . prochlorperazine (COMPAZINE) 10 MG tablet  Take 1 tablet (10 mg total) by mouth every 6 (six) hours as needed (Nausea or vomiting). (Patient not taking: Reported on 07/31/2014) 30 tablet 1  . SUMAtriptan (IMITREX) 6 MG/0.5ML SOLN injection Inject 6 mg into the skin every 2 (two) hours as needed for migraine or headache. May repeat in 2 hours if headache persists or recurs. Max 2 doses in 24 hours    . tiZANidine (ZANAFLEX) 2 MG tablet Take 1 tablet (2 mg total) by mouth 2 (two) times daily as needed for muscle spasms. (Patient not taking: Reported on 07/10/2014) 60 tablet 11   Current Facility-Administered Medications  Medication Dose Route Frequency Provider Last Rate Last Dose  . botulinum toxin Type A (BOTOX) injection 150 Units  150 Units Intramuscular Once Marcial Pacas, MD        OBJECTIVE: Middle-aged white woman who appears stated age 54 Vitals:   08/07/14 1114  BP: 110/65  Pulse: 103  Temp: 98.3 F (36.8 C)  Resp: 18     Body mass index is 26.37 kg/(m^2).    ECOG FS:1 - Symptomatic but completely ambulatory  Sclerae unicteric, pupils equal and reactive Oropharynx clear and moist, tongue red, no sores or lesions noted. No cervical or supraclavicular adenopathy Lungs no rales or rhonchi Heart regular rate and rhythm Abd soft, nontender, positive bowel sounds MSK no focal spinal tenderness, no upper extremity lymphedema Neuro: nonfocal, well oriented, appropriate affect Breasts: deferred  LAB RESULTS:  CMP     Component Value Date/Time   NA 136 08/07/2014 1049   NA 142 04/03/2014 0420   K 3.7 08/07/2014 1049   K 4.1 04/03/2014 0420   CL 106 04/03/2014 0420   CO2 25 08/07/2014 1049   CO2 19 04/03/2014 0420    GLUCOSE 87 08/07/2014 1049   GLUCOSE 162* 04/03/2014 0420   BUN 13.6 08/07/2014 1049   BUN 8 04/03/2014 0420   CREATININE 0.9 08/07/2014 1049   CREATININE 0.71 04/03/2014 0420   CALCIUM 8.7 08/07/2014 1049   CALCIUM 8.6 04/03/2014 0420   PROT 6.6 08/07/2014 1049   PROT 6.5 04/02/2014 0804   ALBUMIN 3.9 08/07/2014 1049   ALBUMIN 3.4* 04/02/2014 0804   AST 10 08/07/2014 1049   AST 20 04/02/2014 0804   ALT 9 08/07/2014 1049   ALT 21 04/02/2014 0804   ALKPHOS 101 08/07/2014 1049   ALKPHOS 112 04/02/2014 0804   BILITOT 0.86 08/07/2014 1049   BILITOT 0.3 04/02/2014 0804   GFRNONAA >90 04/03/2014 0420   GFRAA >90 04/03/2014 0420    I No results found for: SPEP  Lab Results  Component Value Date   WBC 1.8* 08/07/2014   NEUTROABS 0.7* 08/07/2014   HGB 9.9* 08/07/2014   HCT 30.0* 08/07/2014   MCV 101.5* 08/07/2014   PLT 156 08/07/2014      Chemistry      Component Value Date/Time   NA 136 08/07/2014 1049   NA 142 04/03/2014 0420   K 3.7 08/07/2014 1049   K 4.1 04/03/2014 0420   CL 106 04/03/2014 0420   CO2 25 08/07/2014 1049   CO2 19 04/03/2014 0420   BUN 13.6 08/07/2014 1049   BUN 8 04/03/2014 0420   CREATININE 0.9 08/07/2014 1049   CREATININE 0.71 04/03/2014 0420      Component Value Date/Time   CALCIUM 8.7 08/07/2014 1049   CALCIUM 8.6 04/03/2014 0420   ALKPHOS 101 08/07/2014 1049   ALKPHOS 112 04/02/2014 0804   AST 10 08/07/2014 1049   AST  20 04/02/2014 0804   ALT 9 08/07/2014 1049   ALT 21 04/02/2014 0804   BILITOT 0.86 08/07/2014 1049   BILITOT 0.3 04/02/2014 0804       No results found for: LABCA2  No components found for: LABCA125  No results for input(s): INR in the last 168 hours.  Urinalysis    Component Value Date/Time   COLORURINE YELLOW 12/25/2012 2225   APPEARANCEUR CLOUDY* 12/25/2012 2225   LABSPEC 1.010 07/10/2014 1004   LABSPEC 1.027 12/25/2012 2225   PHURINE 5.0 12/25/2012 2225   GLUCOSEU Negative 07/10/2014 1004   GLUCOSEU  NEGATIVE 12/25/2012 2225   HGBUR LARGE* 12/25/2012 2225   BILIRUBINUR NEGATIVE 12/25/2012 2225   KETONESUR NEGATIVE 12/25/2012 2225   PROTEINUR NEGATIVE 12/25/2012 2225   UROBILINOGEN 0.2 07/10/2014 1004   UROBILINOGEN 0.2 12/25/2012 2225   NITRITE NEGATIVE 12/25/2012 2225   LEUKOCYTESUR TRACE* 12/25/2012 2225    STUDIES: Ct Abdomen Pelvis W Contrast  07/17/2014   CLINICAL DATA:  Bilateral low back pain. Hematuria. Urinary frequency. Appendectomy. Cholecystectomy. Breast cancer in August. Bilateral mastectomies. Chemotherapy in progress. Possible kidney stones. ICD10: R 35.0  EXAM: CT ABDOMEN AND PELVIS WITH CONTRAST  TECHNIQUE: Multidetector CT imaging of the abdomen and pelvis was performed using the standard protocol following bolus administration of intravenous contrast.  CONTRAST:  149m OMNIPAQUE IOHEXOL 300 MG/ML  SOLN  COMPARISON:  PET 03/12/2014.  Prior diagnostic CT of 04/28/2012.  FINDINGS: Lower chest: Minimal atelectasis at the right lung base. Normal heart size without pericardial or pleural effusion. Surgical changes at the gastroesophageal junction.  Hepatobiliary: Normal liver. Cholecystectomy, without biliary ductal dilatation.  Pancreas: Normal, without mass or pancreatic ductal dilatation.  Spleen: Normal  Adrenals/Urinary Tract: Normal adrenal glands. No renal calculi or hydronephrosis. Too small to characterize lesions in the left kidney. No hydroureter. The urinary bladder extends superiorly and anteriorly towards the umbilicus, similar to on the prior PET. There is gas within this portion of the bladder, including on image 66 of series 2 and image 61 sagittal.  Stomach/Bowel: Normal stomach. The cecum is positioned within the right paracentral pelvis. Normal terminal ileum. Appendix not visualized.  The proximal transverse duodenum is mildly prominent on image 34 of series 2. More distally, it is normal in caliber. Small bowel is otherwise unremarkable.  Vascular/Lymphatic:  Aortic and branch vessel atherosclerosis. No abdominopelvic adenopathy.  Reproductive: Hysterectomy.  No adnexal mass.  Other:  No significant free fluid.  Musculoskeletal: No focal osseous abnormality.  IMPRESSION: 1. No evidence of urinary tract calculi or urinary tract obstruction. Sensitivity for small stones is slightly decreased secondary to post-contrast technique. 2. Prominence of the proximal transverse duodenum. This could be incidental/transient (not persistent on kidney delayed images). In the appropriate clinical setting, a component of superior mesenteric artery syndrome could have a similar appearance. 3. Anterior and cephalad extension of the urinary bladder. This could be secondary to scarring from prior surgery or a urachal remnant (urachal diverticulum). There is gas within this extension. If the patient has undergone recent instrumentation, this is likely iatrogenic. If not, infection would be a consideration. 4. Advanced atherosclerosis for age. 5. No evidence of metastatic disease.   Electronically Signed   By: KAbigail MiyamotoM.D.   On: 07/17/2014 09:41   Most recent echocardiogram on 07/24/14 showed an EF of 60-65%  ASSESSMENT: 54y.o. BRCA negative [place] woman status post left breast biopsy 02/22/2014 for a clinical T2/T3 NX, stage II or 3 invasive ductal carcinoma, grade 3, estrogen and  progesterone receptor negative, with an MIB-1 of 83%, and HER-2 amplified with a signals a ratio of 2.07and a copy number per cell of 4.75  (1) biopsy of an additional area of enhancement in the left breast 03/08/2014 showed ductal carcinoma in situ, estrogen and progesterone receptor negative.  (2) status post bilateral mastectomies with left sentinel lymph node sampling 04/02/2014, showing:  (a) on the right, benign breast tissue including a single negative lymph node  (b) On  the left, a  pT1c pN0, stage IA invasive ductal carcinoma, grade 3, with negative margins  (3) adjuvant chemotherapy  started 05/08/2014, consisting of carboplatin and docetaxel given every 3 weeks x6, together with trastuzumab, with neulasta day 2   (4) trastuzumab to be continued to complete a year (through OCT 2016) Most recent echocardiogram 03/21/2014 showed a normal ejection fraction  (5) reconstruction to follow after completion of chemotherapy ([place])  (6) flank pain and coag-negative staph in urine: to start nitrofurantoin 07/17/2014  (7) Hx of smoking: The patient quit smoking 03/30/2014  PLAN: Stacy Fernandez feels terribly today. The labs were reviewed in detail and her [place] is down to 0.7. She received neulasta last week and this should rebound shortly. In the meantime she will maintain good hand hygiene, avoid sick contacts and crowds, and alert [place] of any temperatures above 100.5.   Stacy Fernandez is improving slowly. This is her best day so far and will likely continue to make physical gains. We will readdress her sudden neuropathy symptoms when she returns. We may have to make adjustments to the docetaxel or remove it altogether.   Stacy Fernandez will return in 2 weeks for labs, an office visit, and her final cycle of [place]. She understands and agrees with this plan. She knows the goal of treatment in her case is cure. She has been encouraged to call with any issues that might arise before her next visit here.   Marcelino Duster, NP   08/07/2014 12:01 PM

## 2014-08-21 ENCOUNTER — Encounter: Payer: Self-pay | Admitting: Nurse Practitioner

## 2014-08-21 ENCOUNTER — Ambulatory Visit (HOSPITAL_BASED_OUTPATIENT_CLINIC_OR_DEPARTMENT_OTHER): Payer: BLUE CROSS/BLUE SHIELD | Admitting: Nurse Practitioner

## 2014-08-21 ENCOUNTER — Telehealth: Payer: Self-pay | Admitting: Nurse Practitioner

## 2014-08-21 ENCOUNTER — Encounter: Payer: Self-pay | Admitting: Oncology

## 2014-08-21 ENCOUNTER — Ambulatory Visit (HOSPITAL_BASED_OUTPATIENT_CLINIC_OR_DEPARTMENT_OTHER): Payer: BLUE CROSS/BLUE SHIELD

## 2014-08-21 ENCOUNTER — Other Ambulatory Visit (HOSPITAL_BASED_OUTPATIENT_CLINIC_OR_DEPARTMENT_OTHER): Payer: BLUE CROSS/BLUE SHIELD

## 2014-08-21 ENCOUNTER — Telehealth: Payer: Self-pay | Admitting: *Deleted

## 2014-08-21 VITALS — BP 108/60 | HR 96 | Temp 98.1°F | Resp 18 | Ht 66.0 in | Wt 172.2 lb

## 2014-08-21 DIAGNOSIS — C50412 Malignant neoplasm of upper-outer quadrant of left female breast: Secondary | ICD-10-CM

## 2014-08-21 DIAGNOSIS — C50812 Malignant neoplasm of overlapping sites of left female breast: Secondary | ICD-10-CM

## 2014-08-21 DIAGNOSIS — Z5111 Encounter for antineoplastic chemotherapy: Secondary | ICD-10-CM

## 2014-08-21 DIAGNOSIS — C50419 Malignant neoplasm of upper-outer quadrant of unspecified female breast: Secondary | ICD-10-CM

## 2014-08-21 DIAGNOSIS — Z171 Estrogen receptor negative status [ER-]: Secondary | ICD-10-CM

## 2014-08-21 DIAGNOSIS — G620 Drug-induced polyneuropathy: Secondary | ICD-10-CM

## 2014-08-21 DIAGNOSIS — Z5112 Encounter for antineoplastic immunotherapy: Secondary | ICD-10-CM

## 2014-08-21 DIAGNOSIS — Z87891 Personal history of nicotine dependence: Secondary | ICD-10-CM

## 2014-08-21 DIAGNOSIS — D630 Anemia in neoplastic disease: Secondary | ICD-10-CM

## 2014-08-21 DIAGNOSIS — T451X5A Adverse effect of antineoplastic and immunosuppressive drugs, initial encounter: Secondary | ICD-10-CM

## 2014-08-21 LAB — CBC WITH DIFFERENTIAL/PLATELET
BASO%: 0.7 % (ref 0.0–2.0)
BASOS ABS: 0 10*3/uL (ref 0.0–0.1)
EOS%: 1.5 % (ref 0.0–7.0)
Eosinophils Absolute: 0 10*3/uL (ref 0.0–0.5)
HCT: 26.6 % — ABNORMAL LOW (ref 34.8–46.6)
HEMOGLOBIN: 8.8 g/dL — AB (ref 11.6–15.9)
LYMPH%: 26 % (ref 14.0–49.7)
MCH: 34.6 pg — ABNORMAL HIGH (ref 25.1–34.0)
MCHC: 33.1 g/dL (ref 31.5–36.0)
MCV: 104.7 fL — AB (ref 79.5–101.0)
MONO#: 0.3 10*3/uL (ref 0.1–0.9)
MONO%: 11.9 % (ref 0.0–14.0)
NEUT%: 59.9 % (ref 38.4–76.8)
NEUTROS ABS: 1.6 10*3/uL (ref 1.5–6.5)
NRBC: 0 % (ref 0–0)
Platelets: 138 10*3/uL — ABNORMAL LOW (ref 145–400)
RBC: 2.54 10*6/uL — AB (ref 3.70–5.45)
RDW: 16.8 % — AB (ref 11.2–14.5)
WBC: 2.7 10*3/uL — AB (ref 3.9–10.3)
lymph#: 0.7 10*3/uL — ABNORMAL LOW (ref 0.9–3.3)

## 2014-08-21 MED ORDER — ONDANSETRON 16 MG/50ML IVPB (CHCC)
16.0000 mg | Freq: Once | INTRAVENOUS | Status: AC
  Administered 2014-08-21: 16 mg via INTRAVENOUS

## 2014-08-21 MED ORDER — DEXAMETHASONE SODIUM PHOSPHATE 20 MG/5ML IJ SOLN
INTRAMUSCULAR | Status: AC
  Filled 2014-08-21: qty 5

## 2014-08-21 MED ORDER — SODIUM CHLORIDE 0.9 % IJ SOLN
10.0000 mL | INTRAMUSCULAR | Status: DC | PRN
  Administered 2014-08-21: 10 mL
  Filled 2014-08-21: qty 10

## 2014-08-21 MED ORDER — ACETAMINOPHEN 325 MG PO TABS
650.0000 mg | ORAL_TABLET | Freq: Once | ORAL | Status: AC
  Administered 2014-08-21: 650 mg via ORAL

## 2014-08-21 MED ORDER — ACETAMINOPHEN 325 MG PO TABS
ORAL_TABLET | ORAL | Status: AC
  Filled 2014-08-21: qty 2

## 2014-08-21 MED ORDER — DEXAMETHASONE SODIUM PHOSPHATE 20 MG/5ML IJ SOLN
20.0000 mg | Freq: Once | INTRAMUSCULAR | Status: AC
  Administered 2014-08-21: 20 mg via INTRAVENOUS

## 2014-08-21 MED ORDER — LIDOCAINE-PRILOCAINE 2.5-2.5 % EX CREA: 1.0000 "application " | TOPICAL_CREAM | CUTANEOUS | Status: DC | PRN

## 2014-08-21 MED ORDER — TRASTUZUMAB CHEMO INJECTION 440 MG
6.0000 mg/kg | Freq: Once | INTRAVENOUS | Status: AC
  Administered 2014-08-21: 462 mg via INTRAVENOUS
  Filled 2014-08-21: qty 22

## 2014-08-21 MED ORDER — SODIUM CHLORIDE 0.9 % IV SOLN
528.5000 mg | Freq: Once | INTRAVENOUS | Status: AC
  Administered 2014-08-21: 530 mg via INTRAVENOUS
  Filled 2014-08-21: qty 53

## 2014-08-21 MED ORDER — SODIUM CHLORIDE 0.9 % IV SOLN
Freq: Once | INTRAVENOUS | Status: AC
  Administered 2014-08-21: 12:00:00 via INTRAVENOUS

## 2014-08-21 MED ORDER — ONDANSETRON 16 MG/50ML IVPB (CHCC)
INTRAVENOUS | Status: AC
  Filled 2014-08-21: qty 16

## 2014-08-21 MED ORDER — DIPHENHYDRAMINE HCL 25 MG PO CAPS
ORAL_CAPSULE | ORAL | Status: AC
Start: 2014-08-21 — End: 2014-08-21
  Filled 2014-08-21: qty 1

## 2014-08-21 MED ORDER — DIPHENHYDRAMINE HCL 25 MG PO CAPS
25.0000 mg | ORAL_CAPSULE | Freq: Once | ORAL | Status: AC
  Administered 2014-08-21: 25 mg via ORAL

## 2014-08-21 MED ORDER — TOBRAMYCIN-DEXAMETHASONE 0.3-0.1 % OP SUSP: 1.0000 [drp] | OPHTHALMIC | Status: DC

## 2014-08-21 MED ORDER — HEPARIN SOD (PORK) LOCK FLUSH 100 UNIT/ML IV SOLN
500.0000 [IU] | Freq: Once | INTRAVENOUS | Status: AC | PRN
  Administered 2014-08-21: 500 [IU]
  Filled 2014-08-21: qty 5

## 2014-08-21 NOTE — Telephone Encounter (Signed)
Per staff message and POF I have scheduled appts. Advised scheduler of appts. JMW  

## 2014-08-21 NOTE — Progress Notes (Signed)
Faxed clinical information to Ulis Rias @[phone]  phone # [phone] claim # [phone]

## 2014-08-21 NOTE — Patient Instructions (Signed)
[place] Discharge Instructions for Patients Receiving Chemotherapy  Today you received the following chemotherapy agents: Herceptin and Carboplatin   To help prevent nausea and vomiting after your treatment, we encourage you to take your nausea medication AS PRESCRIBED.   If you develop nausea and vomiting that is not controlled by your nausea medication, call the clinic.   BELOW ARE SYMPTOMS THAT SHOULD BE REPORTED IMMEDIATELY:  *FEVER GREATER THAN 100.5 F  *CHILLS WITH OR WITHOUT FEVER  NAUSEA AND VOMITING THAT IS NOT CONTROLLED WITH YOUR NAUSEA MEDICATION  *UNUSUAL SHORTNESS OF BREATH  *UNUSUAL BRUISING OR BLEEDING  TENDERNESS IN MOUTH AND THROAT WITH OR WITHOUT PRESENCE OF ULCERS  *URINARY PROBLEMS  *BOWEL PROBLEMS  UNUSUAL RASH Items with * indicate a potential emergency and should be followed up as soon as possible.  Feel free to call the clinic you have any questions or concerns. The clinic phone number is [phone].

## 2014-08-21 NOTE — Progress Notes (Signed)
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 She brought it to her primary physician's attention him a and on 02/21/2014 she underwent bilateral diagnostic mammography with tomography at [facility]. The breast composition was category B. In the left breast at the 3:00 position there was an irregular mass associated with skin retraction. Ultrasound performed on the same day confirmed a 1.1 cm irregular solid mass in the left breast at the 3:00 position. There were no abnormalities by sonography in the left axilla or in the superior portion of the breast, which is for the patient experiences some tenderness.  Biopsy of the left breast area in question 02/22/2014 showed ([id number]) and invasive ductal carcinoma, grade 3, estrogen and progesterone receptor negative, with an MIB-1 of 83%, and HER-2 amplified, the signals ratio being 2.07, and a copy number per cell 4.75.  On 02/28/2014 the patient underwent bilateral breast MRI, which showed in the posterior third of the left breast at the 3:00 position an irregular spiculated mass measuring 1.8 cm. The left breast was unremarkable and there were no abnormal appearing lymph nodes.  The patient's subsequent history is as  detailed below  INTERVAL HISTORY: Stacy Fernandez returns today for follow up of her breast cancer accompanied by her husband [name]. Today is day 1 cycle 6 of 6 planned cycles of cyclophosphamide, docetaxel and trastuzumab, which she receives every 21 days, with Neulasta on day 2.  REVIEW OF SYSTEMS:  Stacy Fernandez denies fevers, chills, or changes in bowel or bladder habits. Her nausea is managed with PRN antiemetics. Fatigue continues to be an issue. Her appetite is decreased, but her mouth sores have resolved. She is using hydrocodone for her back pain after neulasta. She is not sleeping as well as she has even on the melatonin. Unfortunately this week, she has persistent numbness to the bottom of both feet. A detailed review of systems is otherwise stable.  PAST MEDICAL HISTORY: Past Medical History  Diagnosis Date  . Hypothyroidism   . Shortness of breath     2D ECHO, 04/20/2012 - EF >55%, normal; EXERCISE STRESS TEST, 04/20/2012 - normal, EKG negative for ischemia, no ECG changes  . COPD (chronic obstructive pulmonary disease)   . Emphysema   . Hematuria     cause unknow- saw Urologist  . Hot flashes   . Anginal pain     Dr Claiborne Billings cardiologist- test normal.  " think it is from breast cancer"  . Brain aneurysm      Dr Jannifer Franklin neurologist is monitoring  . Anxiety     Due to situation, CA breast  . Chronic bronchitis     "I've had it quite a few times" (04/02/2014)  . GERD (gastroesophageal reflux disease)   . Migraine     "treated w/botox for them" (04/02/2014)    PAST SURGICAL HISTORY: Past Surgical History  Procedure  Laterality Date  . Cholecystectomy    . Appendectomy    . Laparoscopic nissen fundoplication  11/3974    for GERD- not a problem  . Video bronchoscopy Bilateral 01/16/2013    Procedure: VIDEO BRONCHOSCOPY WITHOUT FLUORO;  Surgeon: [name], MD;  Location: [facility];  Service: Cardiopulmonary;  Laterality: Bilateral;  . Mastectomy complete / simple w/ sentinel node biopsy Left  04/02/2014    axillary SLN  . Mastectomy complete / simple Right 04/02/2014    PROPHYLACTIC  . Portacath placement Right 04/02/2014  . Breast biopsy Left 11/2013 X 3  . Total abdominal hysterectomy    . Cardiac catheterization  2014  . Simple mastectomy with axillary sentinel node biopsy Bilateral 04/02/2014    Procedure: LEFT TOTAL MASTECTOMY WITH LEFT AXILLARY SENTINEL NODE BIOPSY, RIGHT PROPHYLACTIC MASTETCTOMY;  Surgeon: [name], MD;  Location: [place];  Service: General;  Laterality: Bilateral;  . Portacath placement N/A 04/02/2014    Procedure: INSERTION PORT-A-CATH;  Surgeon: [name], MD;  Location: [place];  Service: General;  Laterality: N/A;  . Left heart catheterization with coronary angiogram N/A 12/26/2012    Procedure: LEFT HEART CATHETERIZATION WITH CORONARY ANGIOGRAM;  Surgeon: Lorretta Harp, MD;  Location: [facility] CATH LAB;  Service: Cardiovascular;  Laterality: N/A;    FAMILY HISTORY Family History  Problem Relation Age of Onset  . Heart failure Mother   . Colon cancer Father 17    stomach cancer also in 71s  . Throat cancer Brother 26    smoker  . Heart attack Maternal Grandmother   . Colon cancer Paternal Grandmother 11  . Cancer Paternal Grandfather     kidney and bladder  . Throat cancer Brother 37    throat cancer, smoker  . Ovarian cancer Sister 1    ovarian cancer at 46, colorectal cancer at 54  . Breast cancer Paternal Aunt 2  . Ovarian cancer Other 37    niece with ovarian cancer   the patient's father died at the age of 6 from metastatic stomach cancer the patient's father's mother died from colon cancer at the age of 55. The patient's mother died at the age of 69. The patient had 2 brothers and 2 sisters. One brother died at the age of 56 from throat cancer metastatic to the lung. He was a smoker. A second brother Was diagnosed at age 21 with throat cancer metastatic to lung. He has been given 90 days to live" is not looking good". One sister died  from colon cancer metastatic to bone. One sister died of a drug overdose. There is no history of breast or bearing cancer in the family to the patient's knowledge.  GYNECOLOGIC HISTORY:  No LMP recorded. Patient has had a hysterectomy. Menarche age 54, first live birth age 55, the patient is [place] P4. She underwent hysterectomy with bilateral salpingo-oophorectomy approximately 1990. She took hormone replacement for approximately 2 years.  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    ADVANCED DIRECTIVES: Not in place   HEALTH MAINTENANCE: History  Substance Use Topics  . Smoking status: Former Smoker -- 0.00 packs/day for 37 years    Quit date: 03/29/2014  . Smokeless tobacco: Never Used     Comment: uses e-cig 01/03/13, starting using Chantix 11/13/13  . Alcohol Use: No     Colonoscopy: August 2015  PAP: May 2013  Bone density:  Lipid panel:  Allergies  Allergen Reactions  . Contrast Media [Iodinated Diagnostic Agents] Anaphylaxis    Heart stops and breathing stops; patient ok with 13 hour prep    Current Outpatient Prescriptions  Medication Sig Dispense Refill  . buPROPion (WELLBUTRIN XL) 150 MG 24 hr tablet Take 300 mg by mouth daily.     Marland Kitchen dexamethasone (DECADRON) 4 MG tablet Take 2 tablets (8 mg total) by mouth 2 (two) times daily. Start the day before Taxotere. Then again the day after chemo for 3 days. 20 tablet 0  . gabapentin (NEURONTIN) 300 MG capsule Take 1 capsule (300 mg total) by mouth 4 (four) times daily. (Patient taking differently: Take 300 mg by mouth 2 (two) times daily. ) 120 capsule 6  . HYDROcodone-acetaminophen (NORCO/VICODIN) 5-325 MG per tablet Take 1 tablet by mouth every 6 (six) hours as needed for moderate pain. 60 tablet 0  . ipratropium-albuterol (DUONEB) 0.5-2.5 (3) MG/3ML SOLN Take 3 mLs by nebulization every 6 (six) hours as  needed.    Marland Kitchen levothyroxine (SYNTHROID, LEVOTHROID) 75 MCG tablet Take 75 mcg by mouth daily before breakfast.    . lidocaine-prilocaine (EMLA) cream Apply 1 application topically as needed. Apply over port area 1-2 hours before chemo and cover with plastic wrap 30 g 0  . LORazepam (ATIVAN) 0.5 MG tablet Take 1 tablet (0.5 mg total) by mouth at bedtime as needed (Nausea or vomiting). 30 tablet 0  . montelukast (SINGULAIR) 10 MG tablet Take 10 mg by mouth at bedtime.     Marland Kitchen nystatin (MYCOSTATIN) 100000 UNIT/ML suspension Take 5 mLs (500,000 Units total) by mouth 4 (four) times daily. Swish and swallow 240 mL 1  . ondansetron (ZOFRAN) 8 MG tablet Take 1 tablet (8 mg total) by mouth 2 (two) times daily. 60 tablet 1  . rizatriptan (MAXALT-MLT) 10 MG disintegrating tablet Take 1 tablet (10 mg total) by mouth as needed for migraine. May repeat in 2 hours if needed 15 tablet 11  . tobramycin-dexamethasone (TOBRADEX) ophthalmic solution Place 1 drop into both eyes every 4 (four) hours while awake. 5 mL 0  . traZODone (DESYREL) 100 MG tablet Take 1 tablet (100 mg total) by mouth at bedtime. 30 tablet 2  . verapamil (CALAN-SR) 120 MG CR tablet TAKE 1 TABLET BY MOUTH AT BEDTIME ( MAX 30 DAY SUPPLY PER INSURANCE) 30 tablet 11  . botulinum toxin Type A (BOTOX) 100 UNITS SOLR injection 150 Units every 3 (three) months. Next injection date in Jan 2016.    . cholestyramine (QUESTRAN) 4 G packet Take 1 packet (4 g total) by mouth 3 (three) times daily as needed. (Patient not taking: Reported on 07/31/2014) 60 each 12  . PROAIR HFA 108 (90 BASE) MCG/ACT inhaler Inhale 2 puffs into the lungs every 4 (four) hours as needed for wheezing or shortness of breath.     . prochlorperazine (COMPAZINE) 10 MG tablet Take 1 tablet (10 mg total) by mouth every 6 (six) hours as needed (Nausea or vomiting). (Patient not taking: Reported on 07/31/2014) 30 tablet 1  . SUMAtriptan (IMITREX) 6 MG/0.5ML SOLN injection Inject 6 mg into the skin  every 2 (two) hours as needed for migraine or headache. May repeat in 2 hours if headache persists or recurs. Max 2 doses in 24 hours    . tiZANidine (ZANAFLEX) 2 MG tablet Take 1 tablet (2 mg total) by mouth 2 (two) times daily as needed for muscle spasms. (Patient not taking: Reported on  07/10/2014) 60 tablet 11   Current Facility-Administered Medications  Medication Dose Route Frequency Provider Last Rate Last Dose  . botulinum toxin Type A (BOTOX) injection 150 Units  150 Units Intramuscular Once Marcial Pacas, MD        OBJECTIVE: Middle-aged white woman who appears stated age 9 Vitals:   08/21/14 1032  BP: 108/60  Pulse: 96  Temp: 98.1 F (36.7 C)  Resp: 18     Body mass index is 27.81 kg/(m^2).    ECOG FS:1 - Symptomatic but completely ambulatory   Skin: warm, dry  HEENT: sclerae anicteric, conjunctivae pink, oropharynx clear. No thrush or mucositis.  Lymph Nodes: No cervical or supraclavicular lymphadenopathy  Lungs: clear to auscultation bilaterally, no rales, wheezes, or rhonci  Heart: regular rate and rhythm  Abdomen: round, soft, non tender, positive bowel sounds  Musculoskeletal: No focal spinal tenderness, no peripheral edema  Neuro: non focal, well oriented, positive affect  Breast: deferred  LAB RESULTS:  CMP     Component Value Date/Time   NA 136 08/07/2014 1049   NA 142 04/03/2014 0420   K 3.7 08/07/2014 1049   K 4.1 04/03/2014 0420   CL 106 04/03/2014 0420   CO2 25 08/07/2014 1049   CO2 19 04/03/2014 0420   GLUCOSE 87 08/07/2014 1049   GLUCOSE 162* 04/03/2014 0420   BUN 13.6 08/07/2014 1049   BUN 8 04/03/2014 0420   CREATININE 0.9 08/07/2014 1049   CREATININE 0.71 04/03/2014 0420   CALCIUM 8.7 08/07/2014 1049   CALCIUM 8.6 04/03/2014 0420   PROT 6.6 08/07/2014 1049   PROT 6.5 04/02/2014 0804   ALBUMIN 3.9 08/07/2014 1049   ALBUMIN 3.4* 04/02/2014 0804   AST 10 08/07/2014 1049   AST 20 04/02/2014 0804   ALT 9 08/07/2014 1049   ALT 21 04/02/2014  0804   ALKPHOS 101 08/07/2014 1049   ALKPHOS 112 04/02/2014 0804   BILITOT 0.86 08/07/2014 1049   BILITOT 0.3 04/02/2014 0804   GFRNONAA >90 04/03/2014 0420   GFRAA >90 04/03/2014 0420    I No results found for: SPEP  Lab Results  Component Value Date   WBC 2.7* 08/21/2014   NEUTROABS 1.6 08/21/2014   HGB 8.8* 08/21/2014   HCT 26.6* 08/21/2014   MCV 104.7* 08/21/2014   PLT 138* 08/21/2014      Chemistry      Component Value Date/Time   NA 136 08/07/2014 1049   NA 142 04/03/2014 0420   K 3.7 08/07/2014 1049   K 4.1 04/03/2014 0420   CL 106 04/03/2014 0420   CO2 25 08/07/2014 1049   CO2 19 04/03/2014 0420   BUN 13.6 08/07/2014 1049   BUN 8 04/03/2014 0420   CREATININE 0.9 08/07/2014 1049   CREATININE 0.71 04/03/2014 0420      Component Value Date/Time   CALCIUM 8.7 08/07/2014 1049   CALCIUM 8.6 04/03/2014 0420   ALKPHOS 101 08/07/2014 1049   ALKPHOS 112 04/02/2014 0804   AST 10 08/07/2014 1049   AST 20 04/02/2014 0804   ALT 9 08/07/2014 1049   ALT 21 04/02/2014 0804   BILITOT 0.86 08/07/2014 1049   BILITOT 0.3 04/02/2014 0804       No results found for: LABCA2  No components found for: LABCA125  No results for input(s): INR in the last 168 hours.  Urinalysis    Component Value Date/Time   COLORURINE YELLOW 12/25/2012 2225   APPEARANCEUR CLOUDY* 12/25/2012 2225   LABSPEC 1.010 07/10/2014 1004  LABSPEC 1.027 12/25/2012 2225   PHURINE 5.0 12/25/2012 2225   GLUCOSEU Negative 07/10/2014 1004   GLUCOSEU NEGATIVE 12/25/2012 2225   HGBUR LARGE* 12/25/2012 2225   BILIRUBINUR NEGATIVE 12/25/2012 2225   KETONESUR NEGATIVE 12/25/2012 2225   PROTEINUR NEGATIVE 12/25/2012 2225   UROBILINOGEN 0.2 07/10/2014 1004   UROBILINOGEN 0.2 12/25/2012 2225   NITRITE NEGATIVE 12/25/2012 2225   LEUKOCYTESUR TRACE* 12/25/2012 2225    STUDIES: No results found. Most recent echocardiogram on 07/24/14 showed an EF of 60-65%  ASSESSMENT: 54 y.o. BRCA negative [place]  woman status post left breast biopsy 02/22/2014 for a clinical T2/T3 NX, stage II or 3 invasive ductal carcinoma, grade 3, estrogen and progesterone receptor negative, with an MIB-1 of 83%, and HER-2 amplified with a signals a ratio of 2.07and a copy number per cell of 4.75  (1) biopsy of an additional area of enhancement in the left breast 03/08/2014 showed ductal carcinoma in situ, estrogen and progesterone receptor negative.  (2) status post bilateral mastectomies with left sentinel lymph node sampling 04/02/2014, showing:  (a) on the right, benign breast tissue including a single negative lymph node  (b) On  the left, a  pT1c pN0, stage IA invasive ductal carcinoma, grade 3, with negative margins  (3) adjuvant chemotherapy started 05/08/2014, consisting of carboplatin and docetaxel given every 3 weeks x6, together with trastuzumab, with neulasta day 2   (a) docetaxel removed from final cycle because of persistent neuropathy symptoms  (4) trastuzumab to be continued to complete a year (through OCT 2016) Most recent echocardiogram 03/21/2014 showed a normal ejection fraction  (5) reconstruction to follow after completion of chemotherapy ([place])  (6) flank pain and coag-negative staph in urine: to start nitrofurantoin 07/17/2014  (7) Hx of smoking: The patient quit smoking 03/30/2014  PLAN: Stacy Fernandez is doing fine today. The labs were reviewed in detail and she hasn't rebounded as well as she typically does, but is certainly able to treat today. She will proceed with her 6th and final cycle of carboplatin and trastuzumab. I consulted with Dr. Lindi Adie, and he agreed that the docetaxel should be removed because of worsening neuropathy symptoms.   Stacy Fernandez meets with Dr Iran Planas this Thursday to discuss her upcoming reconstruction options.   Stacy Fernandez will return next week for labs and a nadir visit with Dr. Jana Hakim. She understands and agrees with this plan. She knows the goal of treatment in her case  is cure. She has been encouraged to call with any issues that might arise before her next visit here.   Marcelino Duster, NP   08/21/2014 11:41 AM

## 2014-08-21 NOTE — Telephone Encounter (Signed)
per pof to sch pt appt-pt in trmt room to get updated sch b4 leaving-sent MW email to sch pt trmt

## 2014-08-21 NOTE — Progress Notes (Unsigned)
Call from Bobetta Lime, NP hold Taxotere.

## 2014-08-22 ENCOUNTER — Ambulatory Visit (HOSPITAL_BASED_OUTPATIENT_CLINIC_OR_DEPARTMENT_OTHER): Payer: BLUE CROSS/BLUE SHIELD

## 2014-08-22 ENCOUNTER — Telehealth: Payer: Self-pay | Admitting: *Deleted

## 2014-08-22 DIAGNOSIS — C50812 Malignant neoplasm of overlapping sites of left female breast: Secondary | ICD-10-CM

## 2014-08-22 DIAGNOSIS — C50412 Malignant neoplasm of upper-outer quadrant of left female breast: Secondary | ICD-10-CM

## 2014-08-22 DIAGNOSIS — Z5189 Encounter for other specified aftercare: Secondary | ICD-10-CM

## 2014-08-22 MED ORDER — PEGFILGRASTIM INJECTION 6 MG/0.6ML ~~LOC~~
6.0000 mg | PREFILLED_SYRINGE | Freq: Once | SUBCUTANEOUS | Status: AC
  Administered 2014-08-22: 6 mg via SUBCUTANEOUS
  Filled 2014-08-22: qty 0.6

## 2014-08-22 NOTE — Telephone Encounter (Signed)
I have called patient and moved her injection appts

## 2014-08-24 ENCOUNTER — Encounter: Payer: Self-pay | Admitting: Oncology

## 2014-08-24 NOTE — Progress Notes (Signed)
Put disability forms in registration desk. °

## 2014-08-28 ENCOUNTER — Other Ambulatory Visit (HOSPITAL_BASED_OUTPATIENT_CLINIC_OR_DEPARTMENT_OTHER): Payer: BLUE CROSS/BLUE SHIELD

## 2014-08-28 ENCOUNTER — Ambulatory Visit (HOSPITAL_BASED_OUTPATIENT_CLINIC_OR_DEPARTMENT_OTHER): Payer: BLUE CROSS/BLUE SHIELD | Admitting: Oncology

## 2014-08-28 ENCOUNTER — Telehealth: Payer: Self-pay | Admitting: Oncology

## 2014-08-28 VITALS — BP 95/70 | HR 102 | Temp 98.0°F | Resp 18 | Ht 66.0 in | Wt 168.1 lb

## 2014-08-28 DIAGNOSIS — Z812 Family history of tobacco abuse and dependence: Secondary | ICD-10-CM

## 2014-08-28 DIAGNOSIS — C50412 Malignant neoplasm of upper-outer quadrant of left female breast: Secondary | ICD-10-CM

## 2014-08-28 DIAGNOSIS — Z720 Tobacco use: Secondary | ICD-10-CM

## 2014-08-28 DIAGNOSIS — C50419 Malignant neoplasm of upper-outer quadrant of unspecified female breast: Secondary | ICD-10-CM

## 2014-08-28 DIAGNOSIS — I7090 Unspecified atherosclerosis: Secondary | ICD-10-CM

## 2014-08-28 DIAGNOSIS — D630 Anemia in neoplastic disease: Secondary | ICD-10-CM

## 2014-08-28 DIAGNOSIS — C50812 Malignant neoplasm of overlapping sites of left female breast: Secondary | ICD-10-CM

## 2014-08-28 DIAGNOSIS — E039 Hypothyroidism, unspecified: Secondary | ICD-10-CM

## 2014-08-28 DIAGNOSIS — G620 Drug-induced polyneuropathy: Secondary | ICD-10-CM

## 2014-08-28 LAB — COMPREHENSIVE METABOLIC PANEL (CC13)
ALK PHOS: 128 U/L (ref 40–150)
ALT: 10 U/L (ref 0–55)
AST: 11 U/L (ref 5–34)
Albumin: 3.8 g/dL (ref 3.5–5.0)
Anion Gap: 10 mEq/L (ref 3–11)
BILIRUBIN TOTAL: 0.34 mg/dL (ref 0.20–1.20)
BUN: 11.6 mg/dL (ref 7.0–26.0)
CHLORIDE: 108 meq/L (ref 98–109)
CO2: 23 meq/L (ref 22–29)
Calcium: 8.9 mg/dL (ref 8.4–10.4)
Creatinine: 1 mg/dL (ref 0.6–1.1)
EGFR: 63 mL/min/{1.73_m2} — ABNORMAL LOW (ref >90)
Glucose: 86 mg/dl (ref 70–140)
Potassium: 4.1 mEq/L (ref 3.5–5.1)
Sodium: 142 mEq/L (ref 136–145)
TOTAL PROTEIN: 6.3 g/dL — AB (ref 6.4–8.3)

## 2014-08-28 LAB — CBC WITH DIFFERENTIAL/PLATELET
BASO%: 0.1 % (ref 0.0–2.0)
BASOS ABS: 0 10*3/uL (ref 0.0–0.1)
EOS ABS: 0 10*3/uL (ref 0.0–0.5)
EOS%: 0.3 % (ref 0.0–7.0)
HCT: 28 % — ABNORMAL LOW (ref 34.8–46.6)
HEMOGLOBIN: 9.2 g/dL — AB (ref 11.6–15.9)
LYMPH%: 9.4 % — ABNORMAL LOW (ref 14.0–49.7)
MCH: 34.5 pg — ABNORMAL HIGH (ref 25.1–34.0)
MCHC: 32.9 g/dL (ref 31.5–36.0)
MCV: 104.9 fL — ABNORMAL HIGH (ref 79.5–101.0)
MONO#: 1.3 10*3/uL — ABNORMAL HIGH (ref 0.1–0.9)
MONO%: 11.2 % (ref 0.0–14.0)
NEUT#: 9.2 10*3/uL — ABNORMAL HIGH (ref 1.5–6.5)
NEUT%: 79 % — ABNORMAL HIGH (ref 38.4–76.8)
Platelets: 272 10*3/uL (ref 145–400)
RBC: 2.67 10*6/uL — ABNORMAL LOW (ref 3.70–5.45)
RDW: 15.3 % — ABNORMAL HIGH (ref 11.2–14.5)
WBC: 11.7 10*3/uL — ABNORMAL HIGH (ref 3.9–10.3)
lymph#: 1.1 10*3/uL (ref 0.9–3.3)

## 2014-08-28 NOTE — Addendum Note (Signed)
Addended by: Laureen Abrahams on: 08/28/2014 04:55 PM   Modules accepted: Orders, Medications

## 2014-08-28 NOTE — Progress Notes (Signed)
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 She brought it to her primary physician's attention him a and on 02/21/2014 she underwent bilateral diagnostic mammography with tomography at [facility]. The breast composition was category B. In the left breast at the 3:00 position there was an irregular mass associated with skin retraction. Ultrasound performed on the same day confirmed a 1.1 cm irregular solid mass in the left breast at the 3:00 position. There were no abnormalities by sonography in the left axilla or in the superior portion of the breast, which is for the patient experiences some tenderness.  Biopsy of the left breast area in question 02/22/2014 showed ([id number]) and invasive ductal carcinoma, grade 3, estrogen and progesterone receptor negative, with an MIB-1 of 83%, and HER-2 amplified, the signals ratio being 2.07, and a copy number per cell 4.75.  On 02/28/2014 the patient underwent bilateral breast MRI, which showed in the posterior third of the left breast at the 3:00 position an irregular spiculated mass measuring 1.8 cm. The left breast was unremarkable and there were no abnormal appearing lymph nodes.  The patient's subsequent history is as  detailed below  INTERVAL HISTORY: Stacy Fernandez returns today for follow up of her breast cancer accompanied by her husband [name]. Today is day 7 cycle 6 of her Carbo, docetaxel, trastuzumab, pertuzumab. We held the docetaxel with her final cycle because of peripheral neuropathy issues.  REVIEW OF SYSTEMS:  Stacy Fernandez still has numbness in the balls of her feet all the way through the toes. There is no tingling or pain. By contrast she has a little bit of tingling in her hands and fingers but no numbness. She gets mouth sores with chemotherapy but these have now resolved. She gets a little bit of ankle swelling bilaterally. She feels tired, but she does not climb bed during the day and is doing all her housework. She has mild sinus problems, some hoarseness, but no cough or pleurisy. She feels short of breath when walking up stairs. She sleeps on 2+. Her appetite is poor. She has a little bit of nausea. She has some back and joint pain which is not more intense or persistent than before. She feels anxious but not depressed. A detailed review of systems today was otherwise stable  PAST MEDICAL HISTORY: Past Medical History  Diagnosis Date  . Hypothyroidism   . Shortness of breath     2D ECHO, 04/20/2012 - EF >55%, normal; EXERCISE STRESS TEST, 04/20/2012 - normal, EKG negative for ischemia, no ECG changes  . COPD (chronic obstructive pulmonary disease)   . Emphysema   . Hematuria     cause unknow- saw Urologist  . Hot flashes   . Anginal pain     Dr Claiborne Billings cardiologist- test normal.  " think it is from breast cancer"  . Brain aneurysm  Dr Jannifer Franklin neurologist is monitoring  . Anxiety     Due to situation, CA breast  . Chronic bronchitis     "I've had it quite a few times" (04/02/2014)  . GERD (gastroesophageal reflux disease)   . Migraine     "treated w/botox for them" (04/02/2014)    PAST SURGICAL HISTORY: Past Surgical History  Procedure Laterality Date  . Cholecystectomy    . Appendectomy    .  Laparoscopic nissen fundoplication  01/349    for GERD- not a problem  . Video bronchoscopy Bilateral 01/16/2013    Procedure: VIDEO BRONCHOSCOPY WITHOUT FLUORO;  Surgeon: [name], MD;  Location: [facility];  Service: Cardiopulmonary;  Laterality: Bilateral;  . Mastectomy complete / simple w/ sentinel node biopsy Left 04/02/2014    axillary SLN  . Mastectomy complete / simple Right 04/02/2014    PROPHYLACTIC  . Portacath placement Right 04/02/2014  . Breast biopsy Left 11/2013 X 3  . Total abdominal hysterectomy    . Cardiac catheterization  2014  . Simple mastectomy with axillary sentinel node biopsy Bilateral 04/02/2014    Procedure: LEFT TOTAL MASTECTOMY WITH LEFT AXILLARY SENTINEL NODE BIOPSY, RIGHT PROPHYLACTIC MASTETCTOMY;  Surgeon: [name], MD;  Location: [facility];  Service: General;  Laterality: Bilateral;  . Portacath placement N/A 04/02/2014    Procedure: INSERTION PORT-A-CATH;  Surgeon: [name], MD;  Location: [facility];  Service: General;  Laterality: N/A;  . Left heart catheterization with coronary angiogram N/A 12/26/2012    Procedure: LEFT HEART CATHETERIZATION WITH CORONARY ANGIOGRAM;  Surgeon: Lorretta Harp, MD;  Location: [facility] CATH LAB;  Service: Cardiovascular;  Laterality: N/A;    FAMILY HISTORY Family History  Problem Relation Age of Onset  . Heart failure Mother   . Colon cancer Father 86    stomach cancer also in 64s  . Throat cancer Brother 58    smoker  . Heart attack Maternal Grandmother   . Colon cancer Paternal Grandmother 29  . Cancer Paternal Grandfather     kidney and bladder  . Throat cancer Brother 37    throat cancer, smoker  . Ovarian cancer Sister 26    ovarian cancer at 57, colorectal cancer at 70  . Breast cancer Paternal Aunt 56  . Ovarian cancer Other 9    niece with ovarian cancer   the patient's father died at the age of 50 from metastatic stomach cancer the patient's father's mother died from colon cancer at the age of 10. The  patient's mother died at the age of 54. The patient had 2 brothers and 2 sisters. One brother died at the age of 40 from throat cancer metastatic to the lung. He was a smoker. A second brother Was diagnosed at age 28 with throat cancer metastatic to lung. He has been given 90 days to live" is not looking good". One sister died from colon cancer metastatic to bone. One sister died of a drug overdose. There is no history of breast or bearing cancer in the family to the patient's knowledge.  GYNECOLOGIC HISTORY:  No LMP recorded. Patient has had a hysterectomy. Menarche age 5, first live birth age 18, the patient is [place] P4. She underwent hysterectomy with bilateral salpingo-oophorectomy approximately 1990. She took hormone replacement for approximately 2 years.  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    ADVANCED DIRECTIVES: Not  in place   HEALTH MAINTENANCE: History  Substance Use Topics  . Smoking status: Former Smoker -- 0.00 packs/day for 37 years    Quit date: 03/29/2014  . Smokeless tobacco: Never Used     Comment: uses e-cig 01/03/13, starting using Chantix 11/13/13  . Alcohol Use: No     Colonoscopy: August 2015  PAP: May 2013  Bone density:  Lipid panel:  Allergies  Allergen Reactions  . Contrast Media [Iodinated Diagnostic Agents] Anaphylaxis    Heart stops and breathing stops; patient ok with 13 hour prep    Current Outpatient Prescriptions  Medication Sig Dispense Refill  . botulinum toxin Type A (BOTOX) 100 UNITS SOLR injection 150 Units every 3 (three) months. Next injection date in Jan 2016.    Marland Kitchen buPROPion (WELLBUTRIN XL) 150 MG 24 hr tablet Take 300 mg by mouth daily.     . cholestyramine (QUESTRAN) 4 G packet Take 1 packet (4 g total) by mouth 3 (three) times daily as needed. (Patient not taking: Reported on 07/31/2014) 60 each 12  .  dexamethasone (DECADRON) 4 MG tablet Take 2 tablets (8 mg total) by mouth 2 (two) times daily. Start the day before Taxotere. Then again the day after chemo for 3 days. 20 tablet 0  . gabapentin (NEURONTIN) 300 MG capsule Take 1 capsule (300 mg total) by mouth 4 (four) times daily. (Patient taking differently: Take 300 mg by mouth 2 (two) times daily. ) 120 capsule 6  . HYDROcodone-acetaminophen (NORCO/VICODIN) 5-325 MG per tablet Take 1 tablet by mouth every 6 (six) hours as needed for moderate pain. 60 tablet 0  . ipratropium-albuterol (DUONEB) 0.5-2.5 (3) MG/3ML SOLN Take 3 mLs by nebulization every 6 (six) hours as needed.    Marland Kitchen levothyroxine (SYNTHROID, LEVOTHROID) 75 MCG tablet Take 75 mcg by mouth daily before breakfast.    . lidocaine-prilocaine (EMLA) cream Apply 1 application topically as needed. Apply over port area 1-2 hours before chemo and cover with plastic wrap 30 g 0  . lidocaine-prilocaine (EMLA) cream Apply 1 application topically as needed. Apply over port area 1-2 hours before chemo and cover with plastic wrap 30 g 0  . LORazepam (ATIVAN) 0.5 MG tablet Take 1 tablet (0.5 mg total) by mouth at bedtime as needed (Nausea or vomiting). 30 tablet 0  . montelukast (SINGULAIR) 10 MG tablet Take 10 mg by mouth at bedtime.     Marland Kitchen nystatin (MYCOSTATIN) 100000 UNIT/ML suspension Take 5 mLs (500,000 Units total) by mouth 4 (four) times daily. Swish and swallow 240 mL 1  . ondansetron (ZOFRAN) 8 MG tablet Take 1 tablet (8 mg total) by mouth 2 (two) times daily. 60 tablet 1  . PROAIR HFA 108 (90 BASE) MCG/ACT inhaler Inhale 2 puffs into the lungs every 4 (four) hours as needed for wheezing or shortness of breath.     . prochlorperazine (COMPAZINE) 10 MG tablet Take 1 tablet (10 mg total) by mouth every 6 (six) hours as needed (Nausea or vomiting). (Patient not taking: Reported on 07/31/2014) 30 tablet 1  . rizatriptan (MAXALT-MLT) 10 MG disintegrating tablet Take 1 tablet (10 mg total) by mouth as  needed for migraine. May repeat in 2 hours if needed 15 tablet 11  . SUMAtriptan (IMITREX) 6 MG/0.5ML SOLN injection Inject 6 mg into the skin every 2 (two) hours as needed for migraine or headache. May repeat in 2 hours if headache persists or recurs. Max 2 doses in 24 hours    . tiZANidine (ZANAFLEX)  2 MG tablet Take 1 tablet (2 mg total) by mouth 2 (two) times daily as needed for muscle spasms. (Patient not taking: Reported on 07/10/2014) 60 tablet 11  . tobramycin-dexamethasone (TOBRADEX) ophthalmic solution Place 1 drop into both eyes every 4 (four) hours while awake. 5 mL 0  . tobramycin-dexamethasone (TOBRADEX) ophthalmic solution Place 1 drop into both eyes every 4 (four) hours while awake. 5 mL 0  . traZODone (DESYREL) 100 MG tablet Take 1 tablet (100 mg total) by mouth at bedtime. 30 tablet 2  . verapamil (CALAN-SR) 120 MG CR tablet TAKE 1 TABLET BY MOUTH AT BEDTIME ( MAX 30 DAY SUPPLY PER INSURANCE) 30 tablet 11   Current Facility-Administered Medications  Medication Dose Route Frequency Provider Last Rate Last Dose  . botulinum toxin Type A (BOTOX) injection 150 Units  150 Units Intramuscular Once Marcial Pacas, MD       Facility-Administered Medications Ordered in Other Visits  Medication Dose Route Frequency Provider Last Rate Last Dose  . sodium chloride 0.9 % injection 10 mL  10 mL Intracatheter PRN [name], MD   10 mL at 08/21/14 1449    OBJECTIVE: Middle-aged white woman in no acute distress Filed Vitals:   08/28/14 0954  BP: 95/70  Pulse: 102  Temp: 98 F (36.7 C)  Resp: 18     Body mass index is 27.15 kg/(m^2).    ECOG FS:1 - Symptomatic but completely ambulatory   Sclerae unicteric, pupils round and equal Oropharynx clear and moist-- no thrush or other lesions No cervical or supraclavicular adenopathy Lungs no rales or rhonchi Heart regular rate and rhythm Abd soft, nontender, positive bowel sounds MSK no focal spinal tenderness, no upper extremity  lymphedema Neuro: nonfocal, well oriented, appropriate affect Breasts: Status post bilateral mastectomies. There is no evidence of chest wall recurrence. Both axillae are benign.  LAB RESULTS:  CMP     Component Value Date/Time   NA 136 08/07/2014 1049   NA 142 04/03/2014 0420   K 3.7 08/07/2014 1049   K 4.1 04/03/2014 0420   CL 106 04/03/2014 0420   CO2 25 08/07/2014 1049   CO2 19 04/03/2014 0420   GLUCOSE 87 08/07/2014 1049   GLUCOSE 162* 04/03/2014 0420   BUN 13.6 08/07/2014 1049   BUN 8 04/03/2014 0420   CREATININE 0.9 08/07/2014 1049   CREATININE 0.71 04/03/2014 0420   CALCIUM 8.7 08/07/2014 1049   CALCIUM 8.6 04/03/2014 0420   PROT 6.6 08/07/2014 1049   PROT 6.5 04/02/2014 0804   ALBUMIN 3.9 08/07/2014 1049   ALBUMIN 3.4* 04/02/2014 0804   AST 10 08/07/2014 1049   AST 20 04/02/2014 0804   ALT 9 08/07/2014 1049   ALT 21 04/02/2014 0804   ALKPHOS 101 08/07/2014 1049   ALKPHOS 112 04/02/2014 0804   BILITOT 0.86 08/07/2014 1049   BILITOT 0.3 04/02/2014 0804   GFRNONAA >90 04/03/2014 0420   GFRAA >90 04/03/2014 0420    I No results found for: SPEP  Lab Results  Component Value Date   WBC 11.7* 08/28/2014   NEUTROABS 9.2* 08/28/2014   HGB 9.2* 08/28/2014   HCT 28.0* 08/28/2014   MCV 104.9* 08/28/2014   PLT 272 08/28/2014      Chemistry      Component Value Date/Time   NA 136 08/07/2014 1049   NA 142 04/03/2014 0420   K 3.7 08/07/2014 1049   K 4.1 04/03/2014 0420   CL 106 04/03/2014 0420   CO2 25 08/07/2014  1049   CO2 19 04/03/2014 0420   BUN 13.6 08/07/2014 1049   BUN 8 04/03/2014 0420   CREATININE 0.9 08/07/2014 1049   CREATININE 0.71 04/03/2014 0420      Component Value Date/Time   CALCIUM 8.7 08/07/2014 1049   CALCIUM 8.6 04/03/2014 0420   ALKPHOS 101 08/07/2014 1049   ALKPHOS 112 04/02/2014 0804   AST 10 08/07/2014 1049   AST 20 04/02/2014 0804   ALT 9 08/07/2014 1049   ALT 21 04/02/2014 0804   BILITOT 0.86 08/07/2014 1049   BILITOT  0.3 04/02/2014 0804       No results found for: LABCA2  No components found for: LABCA125  No results for input(s): INR in the last 168 hours.  Urinalysis    Component Value Date/Time   COLORURINE YELLOW 12/25/2012 2225   APPEARANCEUR CLOUDY* 12/25/2012 2225   LABSPEC 1.010 07/10/2014 1004   LABSPEC 1.027 12/25/2012 2225   PHURINE 5.0 12/25/2012 2225   GLUCOSEU Negative 07/10/2014 1004   GLUCOSEU NEGATIVE 12/25/2012 2225   HGBUR LARGE* 12/25/2012 2225   BILIRUBINUR NEGATIVE 12/25/2012 2225   KETONESUR NEGATIVE 12/25/2012 2225   PROTEINUR NEGATIVE 12/25/2012 2225   UROBILINOGEN 0.2 07/10/2014 1004   UROBILINOGEN 0.2 12/25/2012 2225   NITRITE NEGATIVE 12/25/2012 2225   LEUKOCYTESUR TRACE* 12/25/2012 2225    STUDIES: Transthoracic Echocardiography  Patient:  Rana, Hochstein MR #:    [id number] Study Date: 07/24/2014 Gender:   F Age:    54 Height:   167.6 cm Weight:   72.6 kg BSA:    1.85 m^2 Pt. Status: Room:  SONOGRAPHER Farina, Outpatient ATTENDING  Marcelino Duster Nathen May, Heather L REFERRING  Susanne Borders L  cc:  ------------------------------------------------------------------- LV EF: 60% -  65%   ASSESSMENT: 54 y.o. BRCA negative [place] woman status post left breast biopsy 02/22/2014 for a clinical T2/T3 NX, stage II or III invasive ductal carcinoma, grade 3, estrogen and progesterone receptor negative, with an MIB-1 of 83%, and HER-2 amplified with a signals a ratio of 2.07and a copy number per cell of 4.75  (1) biopsy of an additional area of enhancement in the left breast 03/08/2014 showed ductal carcinoma in situ, estrogen and progesterone receptor negative.  (2) status post bilateral mastectomies with left sentinel lymph node sampling 04/02/2014, showing:  (a) on the right, benign breast tissue including a single negative lymph node  (b) On  the left, a  pT1c pN0,  stage IA invasive ductal carcinoma, grade 3, with negative margins  (3) adjuvant chemotherapy started 05/08/2014, consisting of carboplatin and docetaxel given every 3 weeks x6, together with trastuzumab, with neulasta day 2   (a) docetaxel removed from final cycle 08/21/2014 because of persistent neuropathy symptoms  (4) trastuzumab to be continued to complete a year (through OCT 2016) Most recent echocardiogram 03/21/2014 showed a normal ejection fraction  (a) echo 07/24/2014 showed a well-preserved ejection fraction  (5) reconstruction with bilateral silicone implants scheduled for March 2016 ([place])  (6) Hx of smoking: The patient quit smoking 03/30/2014  (7) 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 She did very well with it, and has an excellent prognosis overall.   Of course she will need to continue her trastuzumab every 3 weeks, through mid October. She understands this is not chemotherapy but a form of immunotherapy. Accordingly we went through her med list today and deleted all the antinausea medicines. She  will also not need Neulasta.  She still has some peripheral neuropathy symptoms. Hopefully these will slowly continue to improve. If there are still significant problems by time she sees me late April we will consider acupuncture at that point.  Her next step is proceeding to reconstruction and that is scheduled for March. I will also set her up for repeat echocardiogram in late April. She knows to call for any problems that may develop before her next visit here.  [name], MD   08/28/2014 10:17 AM

## 2014-08-28 NOTE — Telephone Encounter (Signed)
per pof to sch pt appt-sent [name] email to pre-cert-will sch after reply & call p

## 2014-09-11 ENCOUNTER — Ambulatory Visit (HOSPITAL_BASED_OUTPATIENT_CLINIC_OR_DEPARTMENT_OTHER): Payer: BLUE CROSS/BLUE SHIELD

## 2014-09-11 ENCOUNTER — Other Ambulatory Visit (HOSPITAL_BASED_OUTPATIENT_CLINIC_OR_DEPARTMENT_OTHER): Payer: BLUE CROSS/BLUE SHIELD

## 2014-09-11 DIAGNOSIS — C50812 Malignant neoplasm of overlapping sites of left female breast: Secondary | ICD-10-CM

## 2014-09-11 DIAGNOSIS — Z5112 Encounter for antineoplastic immunotherapy: Secondary | ICD-10-CM

## 2014-09-11 DIAGNOSIS — C50412 Malignant neoplasm of upper-outer quadrant of left female breast: Secondary | ICD-10-CM

## 2014-09-11 DIAGNOSIS — C50419 Malignant neoplasm of upper-outer quadrant of unspecified female breast: Secondary | ICD-10-CM

## 2014-09-11 LAB — CBC WITH DIFFERENTIAL/PLATELET
BASO%: 1.3 % (ref 0.0–2.0)
BASOS ABS: 0 10*3/uL (ref 0.0–0.1)
EOS ABS: 0 10*3/uL (ref 0.0–0.5)
EOS%: 0.7 % (ref 0.0–7.0)
HCT: 28.7 % — ABNORMAL LOW (ref 34.8–46.6)
HGB: 9.5 g/dL — ABNORMAL LOW (ref 11.6–15.9)
LYMPH%: 28.2 % (ref 14.0–49.7)
MCH: 34.5 pg — ABNORMAL HIGH (ref 25.1–34.0)
MCHC: 33.1 g/dL (ref 31.5–36.0)
MCV: 104.4 fL — ABNORMAL HIGH (ref 79.5–101.0)
MONO#: 0.5 10*3/uL (ref 0.1–0.9)
MONO%: 17.4 % — ABNORMAL HIGH (ref 0.0–14.0)
NEUT%: 52.4 % (ref 38.4–76.8)
NEUTROS ABS: 1.6 10*3/uL (ref 1.5–6.5)
PLATELETS: 100 10*3/uL — AB (ref 145–400)
RBC: 2.75 10*6/uL — ABNORMAL LOW (ref 3.70–5.45)
RDW: 15.6 % — ABNORMAL HIGH (ref 11.2–14.5)
WBC: 3 10*3/uL — ABNORMAL LOW (ref 3.9–10.3)
lymph#: 0.8 10*3/uL — ABNORMAL LOW (ref 0.9–3.3)
nRBC: 0 % (ref 0–0)

## 2014-09-11 MED ORDER — DIPHENHYDRAMINE HCL 25 MG PO CAPS
ORAL_CAPSULE | ORAL | Status: AC
  Filled 2014-09-11: qty 1

## 2014-09-11 MED ORDER — HEPARIN SOD (PORK) LOCK FLUSH 100 UNIT/ML IV SOLN
500.0000 [IU] | Freq: Once | INTRAVENOUS | Status: AC | PRN
Start: 2014-09-11 — End: 2014-09-11
  Administered 2014-09-11: 500 [IU]
  Filled 2014-09-11: qty 5

## 2014-09-11 MED ORDER — SODIUM CHLORIDE 0.9 % IJ SOLN
10.0000 mL | INTRAMUSCULAR | Status: DC | PRN
  Administered 2014-09-11: 10 mL
  Filled 2014-09-11: qty 10

## 2014-09-11 MED ORDER — ACETAMINOPHEN 325 MG PO TABS
650.0000 mg | ORAL_TABLET | Freq: Once | ORAL | Status: AC
  Administered 2014-09-11: 650 mg via ORAL

## 2014-09-11 MED ORDER — DIPHENHYDRAMINE HCL 25 MG PO CAPS
25.0000 mg | ORAL_CAPSULE | Freq: Once | ORAL | Status: AC
Start: 2014-09-11 — End: 2014-09-11
  Administered 2014-09-11: 25 mg via ORAL

## 2014-09-11 MED ORDER — ACETAMINOPHEN 325 MG PO TABS
ORAL_TABLET | ORAL | Status: AC
  Filled 2014-09-11: qty 2

## 2014-09-11 MED ORDER — SODIUM CHLORIDE 0.9 % IV SOLN
Freq: Once | INTRAVENOUS | Status: AC
  Administered 2014-09-11: 10:00:00 via INTRAVENOUS

## 2014-09-11 MED ORDER — TRASTUZUMAB CHEMO INJECTION 440 MG
6.0000 mg/kg | Freq: Once | INTRAVENOUS | Status: AC
  Administered 2014-09-11: 462 mg via INTRAVENOUS
  Filled 2014-09-11: qty 22

## 2014-09-11 NOTE — Patient Instructions (Signed)
[facility] Discharge Instructions   Today you received the following: Herceptin.  To help prevent nausea and vomiting after your treatment, we encourage you to take your nausea medication as prescribed.   If you develop nausea and vomiting that is not controlled by your nausea medication, call the clinic.   BELOW ARE SYMPTOMS THAT SHOULD BE REPORTED IMMEDIATELY:  *FEVER GREATER THAN 100.5 F  *CHILLS WITH OR WITHOUT FEVER  NAUSEA AND VOMITING THAT IS NOT CONTROLLED WITH YOUR NAUSEA MEDICATION  *UNUSUAL SHORTNESS OF BREATH  *UNUSUAL BRUISING OR BLEEDING  TENDERNESS IN MOUTH AND THROAT WITH OR WITHOUT PRESENCE OF ULCERS  *URINARY PROBLEMS  *BOWEL PROBLEMS  UNUSUAL RASH Items with * indicate a potential emergency and should be followed up as soon as possible.  Feel free to call the clinic you have any questions or concerns. The clinic phone number is [phone].

## 2014-09-13 ENCOUNTER — Other Ambulatory Visit: Payer: Self-pay | Admitting: Nurse Practitioner

## 2014-09-18 ENCOUNTER — Telehealth: Payer: Self-pay | Admitting: *Deleted

## 2014-09-18 NOTE — Telephone Encounter (Signed)
Call returned to pt per demographic number on call note.  Obtained identified VM- message left per above of Dr Iran Planas at [phone].

## 2014-09-18 NOTE — Telephone Encounter (Signed)
THIS NOTE TO VAL DODD,RN.

## 2014-09-19 ENCOUNTER — Encounter (HOSPITAL_BASED_OUTPATIENT_CLINIC_OR_DEPARTMENT_OTHER): Payer: Self-pay | Admitting: *Deleted

## 2014-09-19 NOTE — Progress Notes (Signed)
Pt to bring all meds, inhalers and neb meds, overnight bag Does not need anymore labs per anesthesia-she stated hgb needed to be above 10 for surgeon-we would ck hgb dos-she can ck with Dr T if she wants anymore labs  Pt to hydrate well-and can take a prenatal vit with iron

## 2014-09-24 NOTE — H&P (Signed)
  Patient ID: Stacy Fernandez is a 54 y.o. female.  HPI  Here for follow up breast reconstruction. Patient had been planning for autologous reconstruction and last visit I rec she be evaluated for DIEP. We had referral in place, but patient had further discussion with her family and has now elected for implant based reconstruction. Her last chemotherapy was 08/21/14. States she is set to be out of work through end Herceptin and would like to proceed with reconstruction. 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 Patient has undergone bilateral mastectomies, she elected for prophylactic on right side, no LN sampling on this side. Final pathology on the right, benign breast tissue including a single negative lymph node; on the left, a pT1c pN0, stage IA invasive ductal carcinoma, grade 3. She will continue on herceptin through 04/2015.  Patient presented with pain and dimpling in the lateral aspect of the left breast . MMG and US demonstrate a 1.1 cm mass in the left breast laterally. Biopsy showed grade 3 invasive ductal carcinoma, HER-2 +, ER/PR -. MRI showed a 1.8 cm mass in the left breast at 3:00 position and a 4.5 cm area of additional linear enhancement without mass extending anteriorly from the primary cancer, suspicious for DCIS. Lymph nodes are negative by imaging. She was interested in considering breast conservation and underwent MRI guided biopsy of the anterior linear enhancement. An area of linear enhancement 4.5 cm anterior to the primary cancer revealed ductal carcinoma in situ.   32 C prior, genetic panel negative. Quit smoking 03/2014  Hx brain aneurysm found on CT/MRI for frequent HA. Treatment has been observation with yearly scans. Has chronic migraine treated with Botox.   Dr. Elsworth Soho is her pulmonologist. Dr. Floyde Parkins is her neurologist.   Review of Systems     Objective:    Physical Exam  Cardiovascular: Normal rate. Normal heart  sounds Pulmonary/Chest: Effort normal. Clear to auscultatation Genitourinary:   BW R 15 L 15  Healed transverse scars    Abdomen with small panniculus, Right of midline with transverse scar from CCY, multiple lap scars    Assessment:      Hx Left breast cancer S/p bilateral mastectomies, adjuvant chemotherapy     Plan:    Plan implant based reconstruction with TE, possible acellular dermis. Rec we wait 4-6 weeks from end of chemotherapy to begin. Pictures today. Reviewed risks infection implant, rupture. Discussed overnight hospital stay,one drain per breast, process of expansion. Patient reports she is always anemic and rec she start PNV.   [name], MD [facility] [phone]

## 2014-09-25 ENCOUNTER — Ambulatory Visit (HOSPITAL_BASED_OUTPATIENT_CLINIC_OR_DEPARTMENT_OTHER)
Admission: RE | Admit: 2014-09-25 | Discharge: 2014-09-26 | Disposition: A | Discharge status: Home / Self Care | Payer: BLUE CROSS/BLUE SHIELD | Source: Ambulatory Visit | Attending: Plastic Surgery | Admitting: Plastic Surgery

## 2014-09-25 ENCOUNTER — Ambulatory Visit (HOSPITAL_BASED_OUTPATIENT_CLINIC_OR_DEPARTMENT_OTHER): Payer: BLUE CROSS/BLUE SHIELD | Admitting: Anesthesiology

## 2014-09-25 ENCOUNTER — Encounter (HOSPITAL_BASED_OUTPATIENT_CLINIC_OR_DEPARTMENT_OTHER)
Admission: RE | Disposition: A | Discharge status: Home / Self Care | Payer: Self-pay | Source: Ambulatory Visit | Attending: Plastic Surgery

## 2014-09-25 ENCOUNTER — Encounter (HOSPITAL_BASED_OUTPATIENT_CLINIC_OR_DEPARTMENT_OTHER): Payer: Self-pay | Admitting: Anesthesiology

## 2014-09-25 DIAGNOSIS — Z08 Encounter for follow-up examination after completed treatment for malignant neoplasm: Secondary | ICD-10-CM | POA: Insufficient documentation

## 2014-09-25 DIAGNOSIS — I739 Peripheral vascular disease, unspecified: Secondary | ICD-10-CM | POA: Diagnosis not present

## 2014-09-25 DIAGNOSIS — K219 Gastro-esophageal reflux disease without esophagitis: Secondary | ICD-10-CM | POA: Diagnosis not present

## 2014-09-25 DIAGNOSIS — I2510 Atherosclerotic heart disease of native coronary artery without angina pectoris: Secondary | ICD-10-CM | POA: Diagnosis not present

## 2014-09-25 DIAGNOSIS — E039 Hypothyroidism, unspecified: Secondary | ICD-10-CM | POA: Insufficient documentation

## 2014-09-25 DIAGNOSIS — J449 Chronic obstructive pulmonary disease, unspecified: Secondary | ICD-10-CM | POA: Diagnosis not present

## 2014-09-25 DIAGNOSIS — Z853 Personal history of malignant neoplasm of breast: Secondary | ICD-10-CM | POA: Diagnosis not present

## 2014-09-25 DIAGNOSIS — Z9013 Acquired absence of bilateral breasts and nipples: Secondary | ICD-10-CM | POA: Diagnosis not present

## 2014-09-25 DIAGNOSIS — Z87891 Personal history of nicotine dependence: Secondary | ICD-10-CM | POA: Insufficient documentation

## 2014-09-25 DIAGNOSIS — Z9221 Personal history of antineoplastic chemotherapy: Secondary | ICD-10-CM | POA: Insufficient documentation

## 2014-09-25 DIAGNOSIS — Z9010 Acquired absence of unspecified breast and nipple: Secondary | ICD-10-CM

## 2014-09-25 HISTORY — DX: Presence of spectacles and contact lenses: Z97.3

## 2014-09-25 HISTORY — PX: BREAST RECONSTRUCTION WITH PLACEMENT OF TISSUE EXPANDER AND FLEX HD (ACELLULAR HYDRATED DERMIS): SHX6295

## 2014-09-25 HISTORY — DX: Presence of dental prosthetic device (complete) (partial): Z97.2

## 2014-09-25 LAB — POCT HEMOGLOBIN-HEMACUE: HEMOGLOBIN: 10.8 g/dL — AB (ref 12.0–15.0)

## 2014-09-25 SURGERY — BREAST RECONSTRUCTION WITH PLACEMENT OF TISSUE EXPANDER AND FLEX HD (ACELLULAR HYDRATED DERMIS)
Anesthesia: General | Site: Breast | Laterality: Bilateral

## 2014-09-25 MED ORDER — TRAZODONE HCL 100 MG PO TABS
100.0000 mg | ORAL_TABLET | Freq: Every day | ORAL | Status: DC
  Administered 2014-09-25: 100 mg via ORAL

## 2014-09-25 MED ORDER — CEFAZOLIN SODIUM-DEXTROSE 2-3 GM-% IV SOLR
2.0000 g | INTRAVENOUS | Status: AC
  Administered 2014-09-25: 2 g via INTRAVENOUS

## 2014-09-25 MED ORDER — HYDROMORPHONE HCL 1 MG/ML IJ SOLN
INTRAMUSCULAR | Status: AC
  Filled 2014-09-25: qty 1

## 2014-09-25 MED ORDER — SODIUM CHLORIDE 0.9 % IV SOLN
INTRAVENOUS | Status: DC | PRN
  Administered 2014-09-25: 08:00:00

## 2014-09-25 MED ORDER — LACTATED RINGERS IV SOLN
INTRAVENOUS | Status: DC
  Administered 2014-09-25 (×2): via INTRAVENOUS

## 2014-09-25 MED ORDER — CEFAZOLIN SODIUM 1-5 GM-% IV SOLN
1.0000 g | Freq: Three times a day (TID) | INTRAVENOUS | Status: AC
Start: 2014-09-25 — End: 2014-09-26
  Administered 2014-09-25 – 2014-09-26 (×3): 1 g via INTRAVENOUS

## 2014-09-25 MED ORDER — GABAPENTIN 300 MG PO CAPS: 300.0000 mg | ORAL_CAPSULE | Freq: Two times a day (BID) | ORAL | Status: DC

## 2014-09-25 MED ORDER — BUPIVACAINE-EPINEPHRINE (PF) 0.25% -1:200000 IJ SOLN
INTRAMUSCULAR | Status: AC
  Filled 2014-09-25: qty 30

## 2014-09-25 MED ORDER — RIZATRIPTAN BENZOATE 5 MG PO TBDP: 10.0000 mg | ORAL_TABLET | ORAL | Status: DC | PRN

## 2014-09-25 MED ORDER — IPRATROPIUM-ALBUTEROL 0.5-2.5 (3) MG/3ML IN SOLN
3.0000 mL | RESPIRATORY_TRACT | Status: DC | PRN
  Administered 2014-09-26: 3 mL via RESPIRATORY_TRACT

## 2014-09-25 MED ORDER — SULFAMETHOXAZOLE-TRIMETHOPRIM 800-160 MG PO TABS: 1.0000 | ORAL_TABLET | Freq: Two times a day (BID) | ORAL | Status: DC

## 2014-09-25 MED ORDER — OXYCODONE HCL 5 MG PO TABS: 5.0000 mg | ORAL_TABLET | Freq: Once | ORAL | Status: AC | PRN

## 2014-09-25 MED ORDER — MONTELUKAST SODIUM 10 MG PO TABS
10.0000 mg | ORAL_TABLET | Freq: Every day | ORAL | Status: DC
  Administered 2014-09-25: 10 mg via ORAL

## 2014-09-25 MED ORDER — FENTANYL CITRATE 0.05 MG/ML IJ SOLN: 50.0000 ug | INTRAMUSCULAR | Status: DC | PRN

## 2014-09-25 MED ORDER — LIDOCAINE HCL (CARDIAC) 20 MG/ML IV SOLN
INTRAVENOUS | Status: DC | PRN
  Administered 2014-09-25: 50 mg via INTRAVENOUS

## 2014-09-25 MED ORDER — HYDROCODONE-ACETAMINOPHEN 10-325 MG PO TABS
1.0000 | ORAL_TABLET | ORAL | Status: DC | PRN
  Administered 2014-09-25 – 2014-09-26 (×5): 1 via ORAL
  Filled 2014-09-25 (×5): qty 1

## 2014-09-25 MED ORDER — PROMETHAZINE HCL 25 MG/ML IJ SOLN: 6.2500 mg | INTRAMUSCULAR | Status: DC | PRN

## 2014-09-25 MED ORDER — CEFAZOLIN SODIUM-DEXTROSE 2-3 GM-% IV SOLR
INTRAVENOUS | Status: AC
  Filled 2014-09-25: qty 50

## 2014-09-25 MED ORDER — PROPOFOL 10 MG/ML IV BOLUS
INTRAVENOUS | Status: AC
  Filled 2014-09-25: qty 60

## 2014-09-25 MED ORDER — HYDROMORPHONE HCL 1 MG/ML IJ SOLN
0.5000 mg | INTRAMUSCULAR | Status: DC | PRN
  Administered 2014-09-25 – 2014-09-26 (×2): 1 mg via INTRAVENOUS
  Filled 2014-09-25 (×2): qty 1

## 2014-09-25 MED ORDER — ONDANSETRON HCL 4 MG/2ML IJ SOLN: 4.0000 mg | Freq: Four times a day (QID) | INTRAMUSCULAR | Status: DC | PRN

## 2014-09-25 MED ORDER — ONDANSETRON HCL 4 MG PO TABS: 4.0000 mg | ORAL_TABLET | Freq: Four times a day (QID) | ORAL | Status: DC | PRN

## 2014-09-25 MED ORDER — FENTANYL CITRATE 0.05 MG/ML IJ SOLN
INTRAMUSCULAR | Status: AC
  Filled 2014-09-25: qty 2

## 2014-09-25 MED ORDER — TIZANIDINE HCL 2 MG PO TABS
2.0000 mg | ORAL_TABLET | Freq: Two times a day (BID) | ORAL | Status: DC | PRN
  Administered 2014-09-26: 2 mg via ORAL
  Filled 2014-09-25: qty 1

## 2014-09-25 MED ORDER — KETOROLAC TROMETHAMINE 15 MG/ML IJ SOLN
15.0000 mg | Freq: Three times a day (TID) | INTRAMUSCULAR | Status: AC
  Administered 2014-09-25 – 2014-09-26 (×3): 15 mg via INTRAVENOUS
  Filled 2014-09-25 (×3): qty 1

## 2014-09-25 MED ORDER — PROPOFOL 10 MG/ML IV BOLUS
INTRAVENOUS | Status: DC | PRN
  Administered 2014-09-25: 150 mg via INTRAVENOUS
  Administered 2014-09-25: 50 mg via INTRAVENOUS

## 2014-09-25 MED ORDER — MIDAZOLAM HCL 2 MG/2ML IJ SOLN: 1.0000 mg | INTRAMUSCULAR | Status: DC | PRN

## 2014-09-25 MED ORDER — CHLORHEXIDINE GLUCONATE 4 % EX LIQD: 1.0000 "application " | Freq: Once | CUTANEOUS | Status: DC

## 2014-09-25 MED ORDER — KCL IN DEXTROSE-NACL 20-5-0.45 MEQ/L-%-% IV SOLN
INTRAVENOUS | Status: DC
  Administered 2014-09-25: 13:00:00 via INTRAVENOUS
  Administered 2014-09-26: 75 mL/h via INTRAVENOUS
  Filled 2014-09-25 (×2): qty 1000

## 2014-09-25 MED ORDER — FENTANYL CITRATE 0.05 MG/ML IJ SOLN
INTRAMUSCULAR | Status: AC
  Filled 2014-09-25: qty 6

## 2014-09-25 MED ORDER — PROPOFOL 10 MG/ML IV EMUL
INTRAVENOUS | Status: AC
  Filled 2014-09-25: qty 50

## 2014-09-25 MED ORDER — HYDROMORPHONE HCL 1 MG/ML IJ SOLN
0.2500 mg | INTRAMUSCULAR | Status: DC | PRN
  Administered 2014-09-25 (×3): 0.5 mg via INTRAVENOUS

## 2014-09-25 MED ORDER — BUPROPION HCL ER (XL) 300 MG PO TB24
300.0000 mg | ORAL_TABLET | Freq: Every day | ORAL | Status: DC
  Administered 2014-09-26: 300 mg via ORAL

## 2014-09-25 MED ORDER — LEVOTHYROXINE SODIUM 75 MCG PO TABS
75.0000 ug | ORAL_TABLET | Freq: Every day | ORAL | Status: DC
  Administered 2014-09-26: 75 ug via ORAL

## 2014-09-25 MED ORDER — PHENYLEPHRINE HCL 10 MG/ML IJ SOLN
INTRAMUSCULAR | Status: DC | PRN
  Administered 2014-09-25 (×3): 80 ug via INTRAVENOUS

## 2014-09-25 MED ORDER — FENTANYL CITRATE 0.05 MG/ML IJ SOLN
INTRAMUSCULAR | Status: DC | PRN
  Administered 2014-09-25 (×2): 50 ug via INTRAVENOUS
  Administered 2014-09-25: 25 ug via INTRAVENOUS
  Administered 2014-09-25 (×3): 50 ug via INTRAVENOUS
  Administered 2014-09-25: 25 ug via INTRAVENOUS

## 2014-09-25 MED ORDER — OXYCODONE HCL 5 MG/5ML PO SOLN: 5.0000 mg | Freq: Once | ORAL | Status: AC | PRN

## 2014-09-25 MED ORDER — CEFAZOLIN SODIUM 1-5 GM-% IV SOLN
INTRAVENOUS | Status: AC
  Filled 2014-09-25: qty 50

## 2014-09-25 MED ORDER — MIDAZOLAM HCL 5 MG/5ML IJ SOLN
INTRAMUSCULAR | Status: DC | PRN
  Administered 2014-09-25: 2 mg via INTRAVENOUS

## 2014-09-25 MED ORDER — MIDAZOLAM HCL 2 MG/2ML IJ SOLN
INTRAMUSCULAR | Status: AC
  Filled 2014-09-25: qty 2

## 2014-09-25 MED ORDER — ALBUTEROL SULFATE HFA 108 (90 BASE) MCG/ACT IN AERS: 2.0000 | INHALATION_SPRAY | RESPIRATORY_TRACT | Status: DC | PRN

## 2014-09-25 MED ORDER — VERAPAMIL HCL ER 120 MG PO TBCR
120.0000 mg | EXTENDED_RELEASE_TABLET | Freq: Every day | ORAL | Status: DC
  Administered 2014-09-25: 120 mg via ORAL

## 2014-09-25 MED ORDER — HYDROCODONE-ACETAMINOPHEN 10-325 MG PO TABS: 1.0000 | ORAL_TABLET | ORAL | Status: DC | PRN

## 2014-09-25 SURGICAL SUPPLY — 63 items
BAG DECANTER FOR FLEXI CONT (MISCELLANEOUS) ×2 IMPLANT
BINDER BREAST LRG (GAUZE/BANDAGES/DRESSINGS) ×1 IMPLANT
BINDER BREAST MEDIUM (GAUZE/BANDAGES/DRESSINGS) IMPLANT
BINDER BREAST XLRG (GAUZE/BANDAGES/DRESSINGS) IMPLANT
BINDER BREAST XXLRG (GAUZE/BANDAGES/DRESSINGS) IMPLANT
BLADE SURG 10 STRL SS (BLADE) ×2 IMPLANT
BLADE SURG 15 STRL LF DISP TIS (BLADE) ×1 IMPLANT
BLADE SURG 15 STRL SS (BLADE) ×2
BNDG GAUZE ELAST 4 BULKY (GAUZE/BANDAGES/DRESSINGS) ×4 IMPLANT
CANISTER SUCT 1200ML W/VALVE (MISCELLANEOUS) ×2 IMPLANT
CHLORAPREP W/TINT 26ML (MISCELLANEOUS) ×2 IMPLANT
COVER BACK TABLE 60X90IN (DRAPES) ×2 IMPLANT
COVER MAYO STAND STRL (DRAPES) ×2 IMPLANT
DECANTER SPIKE VIAL GLASS SM (MISCELLANEOUS) IMPLANT
DRAIN CHANNEL 15F RND FF W/TCR (WOUND CARE) ×3 IMPLANT
DRAPE LAPAROSCOPIC ABDOMINAL (DRAPES) ×2 IMPLANT
DRSG PAD ABDOMINAL 8X10 ST (GAUZE/BANDAGES/DRESSINGS) ×4 IMPLANT
DRSG TEGADERM 4X10 (GAUZE/BANDAGES/DRESSINGS) IMPLANT
DRSG TEGADERM 4X4.75 (GAUZE/BANDAGES/DRESSINGS) IMPLANT
ELECT BLADE 4.0 EZ CLEAN MEGAD (MISCELLANEOUS) ×2
ELECT COATED BLADE 2.86 ST (ELECTRODE) ×2 IMPLANT
ELECT REM PT RETURN 9FT ADLT (ELECTROSURGICAL) ×2
ELECTRODE BLDE 4.0 EZ CLN MEGD (MISCELLANEOUS) ×1 IMPLANT
ELECTRODE REM PT RTRN 9FT ADLT (ELECTROSURGICAL) ×1 IMPLANT
EVACUATOR SILICONE 100CC (DRAIN) ×1 IMPLANT
GLOVE BIO SURGEON STRL SZ 6 (GLOVE) ×5 IMPLANT
GLOVE BIO SURGEON STRL SZ 6.5 (GLOVE) ×1 IMPLANT
GLOVE BIOGEL M 7.0 STRL (GLOVE) ×1 IMPLANT
GLOVE BIOGEL PI IND STRL 7.0 (GLOVE) IMPLANT
GLOVE BIOGEL PI IND STRL 7.5 (GLOVE) IMPLANT
GLOVE BIOGEL PI INDICATOR 7.0 (GLOVE) ×2
GLOVE BIOGEL PI INDICATOR 7.5 (GLOVE) ×1
GLOVE ECLIPSE 6.5 STRL STRAW (GLOVE) ×3 IMPLANT
GOWN STRL REUS W/ TWL LRG LVL3 (GOWN DISPOSABLE) ×2 IMPLANT
GOWN STRL REUS W/TWL LRG LVL3 (GOWN DISPOSABLE) ×8
GRAFT FLEX HD BILAT 4X16 THICK (Tissue Mesh) ×2 IMPLANT
IMPL BREAST ARTOURA 375CC (Breast) IMPLANT
IMPLANT BREAST ARTOURA 375CC (Breast) ×4 IMPLANT
IV NS 500ML (IV SOLUTION)
IV NS 500ML BAXH (IV SOLUTION) ×1 IMPLANT
KIT FILL SYSTEM UNIVERSAL (SET/KITS/TRAYS/PACK) ×2 IMPLANT
LIQUID BAND (GAUZE/BANDAGES/DRESSINGS) ×2 IMPLANT
NDL HYPO 25X1 1.5 SAFETY (NEEDLE) IMPLANT
NEEDLE HYPO 25X1 1.5 SAFETY (NEEDLE) IMPLANT
NS IRRIG 1000ML POUR BTL (IV SOLUTION) ×1 IMPLANT
PACK BASIN DAY SURGERY FS (CUSTOM PROCEDURE TRAY) ×2 IMPLANT
PENCIL BUTTON HOLSTER BLD 10FT (ELECTRODE) ×2 IMPLANT
PIN SAFETY STERILE (MISCELLANEOUS) ×2 IMPLANT
SLEEVE SCD COMPRESS KNEE MED (MISCELLANEOUS) ×2 IMPLANT
SLEEVE SURGEON STRL (DRAPES) ×1 IMPLANT
SPONGE LAP 18X18 X RAY DECT (DISPOSABLE) ×4 IMPLANT
STAPLER VISISTAT 35W (STAPLE) ×3 IMPLANT
SUT ETHILON 2 0 FS 18 (SUTURE) ×2 IMPLANT
SUT MNCRL AB 4-0 PS2 18 (SUTURE) ×3 IMPLANT
SUT VIC AB 3-0 SH 27 (SUTURE) ×12
SUT VIC AB 3-0 SH 27X BRD (SUTURE) ×1 IMPLANT
SUT VICRYL 4-0 PS2 18IN ABS (SUTURE) ×3 IMPLANT
SYR BULB IRRIGATION 50ML (SYRINGE) ×2 IMPLANT
SYR CONTROL 10ML LL (SYRINGE) IMPLANT
TOWEL OR 17X24 6PK STRL BLUE (TOWEL DISPOSABLE) ×5 IMPLANT
TUBE CONNECTING 20X1/4 (TUBING) ×3 IMPLANT
UNDERPAD 30X30 INCONTINENT (UNDERPADS AND DIAPERS) ×4 IMPLANT
YANKAUER SUCT BULB TIP NO VENT (SUCTIONS) ×2 IMPLANT

## 2014-09-25 NOTE — Transfer of Care (Signed)
Immediate Anesthesia Transfer of Care Note  Patient: Stacy Fernandez  Procedure(s) Performed: Procedure(s): PLACEMENT OF BILATERAL TISSUE EXPANDER AND POSSIBLE ACELLULAR DERMIS FOR BREAST RECONSTRUCTION  (Bilateral)  Patient Location: PACU  Anesthesia Type:General  Level of Consciousness: alert  and patient cooperative  Airway & Oxygen Therapy: Patient Spontanous Breathing and Patient connected to face mask oxygen  Post-op Assessment: Report given to RN and Post -op Vital signs reviewed and stable  Post vital signs: Reviewed and stable  Last Vitals:  Filed Vitals:   09/25/14 1019  BP:   Pulse: 75  Temp:   Resp: 25    Complications: No apparent anesthesia complications

## 2014-09-25 NOTE — Anesthesia Preprocedure Evaluation (Addendum)
Anesthesia Evaluation  Patient identified by MRN, date of birth, ID band Patient awake    Reviewed: Allergy & Precautions, NPO status , Patient's Chart, lab work & pertinent test results  History of Anesthesia Complications Negative for: history of anesthetic complications  Airway Mallampati: II  TM Distance: >3 FB Neck ROM: Full    Dental  (+) Dental Advisory Given, Edentulous Upper   Pulmonary COPDformer smoker,    Pulmonary exam normal       Cardiovascular + angina + CAD and + Peripheral Vascular Disease     Neuro/Psych  Headaches, Anxiety    GI/Hepatic Neg liver ROS, GERD-  ,  Endo/Other  Hypothyroidism   Renal/GU negative Renal ROS     Musculoskeletal   Abdominal   Peds  Hematology   Anesthesia Other Findings   Reproductive/Obstetrics                            Anesthesia Physical Anesthesia Plan  ASA: III  Anesthesia Plan: General   Post-op Pain Management:    Induction: Intravenous  Airway Management Planned: Oral ETT  Additional Equipment:   Intra-op Plan:   Post-operative Plan: Extubation in OR  Informed Consent: I have reviewed the patients History and Physical, chart, labs and discussed the procedure including the risks, benefits and alternatives for the proposed anesthesia with the patient or authorized representative who has indicated his/her understanding and acceptance.   Dental advisory given  Plan Discussed with: CRNA, Anesthesiologist and Surgeon  Anesthesia Plan Comments:        Anesthesia Quick Evaluation

## 2014-09-25 NOTE — Anesthesia Postprocedure Evaluation (Signed)
Anesthesia Post Note  Patient: Stacy Fernandez  Procedure(s) Performed: Procedure(s) (LRB): PLACEMENT OF BILATERAL TISSUE EXPANDER AND POSSIBLE ACELLULAR DERMIS FOR BREAST RECONSTRUCTION  (Bilateral)  Anesthesia type: general  Patient location: PACU  Post pain: Pain level controlled  Post assessment: Patient's Cardiovascular Status Stable  Last Vitals:  Filed Vitals:   09/25/14 1215  BP: 117/76  Pulse: 79  Temp:   Resp: 13    Post vital signs: Reviewed and stable  Level of consciousness: sedated  Complications: No apparent anesthesia complications

## 2014-09-25 NOTE — Op Note (Signed)
Operative Note   DATE OF OPERATION: 3.8.2016  LOCATION: [facility]-observation  SURGICAL DIVISION: Plastic Surgery  PREOPERATIVE DIAGNOSES:  1. History left breast cancer 2. Acquired absence bilateral breasts  POSTOPERATIVE DIAGNOSES:  same  PROCEDURE:  1. Bilateral breast reconstruction with placement tissue expanders 2. Acellular dermis for breast reconstruction 100 cm2  SURGEON: [name] MD MBA  ASSISTANT: none  ANESTHESIA:  General.   EBL: 50 ml  COMPLICATIONS: None immediate.   INDICATIONS FOR PROCEDURE:  The patient, Stacy Fernandez, is a 54 y.o. female born on April 01, 1961, is here for delayed reconstruction following bilateral mastectomies and adjuvant chemotherapy.   FINDINGS: Mentor Artoura High Profile 375 ml tissue expanders placed bilaterally. Initial fill volume 180 ml bilateral. Ref [id number] Right SN [id number] Left SN [id number]  DESCRIPTION OF PROCEDURE:  The patient's chest midline, sternal notch, anterior axillary lines and areas of redundant mastectomy flap skin and medial and lateral extent of mastectomy scars were marked in the preoperative area. The inframammary folds were not readily visible following mastectomy and desired IMF was marked symmetrically over bilateral chest. The patient was taken to the operating room. SCDs were placed and IV antibiotics were given. The patient's operative site was prepped and draped in a sterile fashion. A time out was performed and all information was confirmed to be correct.   Reconstruction began right side. Incision made through prior mastectomy scar. Skin flaps elevated in limited fashion off underlying pectoralis muscle. Inferiorly, dissection completed to desired inramammry fold. There was a small seroma cavity, and this was excised. The inferior insertions of pectoralis major muscle were divided and submuscular dissection completed. Acelluar dermis (Flex HD) was perforated and sewn to inferior border of  pectoralis major with running 3-0 vicryl. A 15 Fr drain was placed in submuscular position and secured to skin with 2-0 nylon. The cavity was irrigated with solution containing Ancef, genatmicin, and bacitracin. The tissue expander was prepared and placed in submuscular position. The expander was secured to chest wall with a 3-0 vicryl. The inferior border of the acellular dermis was inset to inframammary fold and laterally to serratus muscle. The incision was closed with 3-0 vicryl in fascial layer and 4-0 vicryl in dermis. Skin closure completed with 4-0 monocryl subcuticular and tissue adhesive. I then directed attention to left breast. Similarly, the skin and subcutaneous tissue was elevated in limited fashion to expose inferior border and insertions of pectoralis and to level of desired IMF. The acellular dermis was secured with 3-0 vicryl and expander placed. Closure of skin and drain placement completed as on the right. The ports were accessed and filled to 180 ml bilaterally. Dry dressing and breast binder applied.   The patient was allowed to wake from anesthesia, extubated and taken to the recovery room in satisfactory condition.   SPECIMENS: none  DRAINS: 15 Fr JP  in right and left chest  [name], MD [facility] Plastic & Reconstructive Surgery [phone]

## 2014-09-25 NOTE — Anesthesia Procedure Notes (Signed)
Procedure Name: LMA Insertion Date/Time: 09/25/2014 7:38 AM Performed by: Toula Moos L Pre-anesthesia Checklist: Patient identified, Emergency Drugs available, Suction available, Patient being monitored and Timeout performed Patient Re-evaluated:Patient Re-evaluated prior to inductionOxygen Delivery Method: Circle System Utilized Preoxygenation: Pre-oxygenation with 100% oxygen Intubation Type: IV induction Ventilation: Mask ventilation without difficulty LMA: LMA inserted LMA Size: 4.0 Number of attempts: 1 Airway Equipment and Method: Bite block Placement Confirmation: positive ETCO2 Tube secured with: Tape Dental Injury: Teeth and Oropharynx as per pre-operative assessment

## 2014-09-25 NOTE — Interval H&P Note (Signed)
History and Physical Interval Note:  09/25/2014 6:52 AM  Stacy Fernandez  has presented today for surgery, with the diagnosis of HX OF BREAST CANCER ACQUIRED ABSCESS BILATERAL BREAST   The various methods of treatment have been discussed with the patient and family. After consideration of risks, benefits and other options for treatment, the patient has consented to  Procedure(s): PLACEMENT OF BILATERAL TISSUE EXPANDER AND POSSIBLE ACELLULAR DERMIS FOR BREAST RECONSTRUCTION  (Bilateral) as a surgical intervention .  The patient's history has been reviewed, patient examined, no change in status, stable for surgery.  I have reviewed the patient's chart and labs.  Questions were answered to the patient's satisfaction.     Teran Daughenbaugh

## 2014-09-25 NOTE — Progress Notes (Signed)
Per Dr. Tobias Alexander ok for Pt to use home albuterol neb treatment now prior to sx. Completed. Tolerated well

## 2014-09-26 ENCOUNTER — Encounter (HOSPITAL_BASED_OUTPATIENT_CLINIC_OR_DEPARTMENT_OTHER): Payer: Self-pay | Admitting: Plastic Surgery

## 2014-09-26 ENCOUNTER — Other Ambulatory Visit: Payer: Self-pay | Admitting: Oncology

## 2014-09-26 DIAGNOSIS — Z08 Encounter for follow-up examination after completed treatment for malignant neoplasm: Secondary | ICD-10-CM | POA: Diagnosis not present

## 2014-09-26 MED ORDER — TIZANIDINE HCL 2 MG PO TABS: 2.0000 mg | ORAL_TABLET | Freq: Two times a day (BID) | ORAL | Status: DC | PRN

## 2014-09-26 MED ORDER — ALBUTEROL SULFATE (2.5 MG/3ML) 0.083% IN NEBU
INHALATION_SOLUTION | RESPIRATORY_TRACT | Status: AC
  Filled 2014-09-26: qty 3

## 2014-09-28 ENCOUNTER — Inpatient Hospital Stay (HOSPITAL_COMMUNITY)
Admission: EM | Admit: 2014-09-28 | Discharge: 2014-10-01 | DRG: 194 | Disposition: A | Discharge status: Home / Self Care | Payer: BLUE CROSS/BLUE SHIELD | Attending: Internal Medicine | Admitting: Internal Medicine

## 2014-09-28 ENCOUNTER — Encounter (HOSPITAL_COMMUNITY): Payer: Self-pay | Admitting: Emergency Medicine

## 2014-09-28 ENCOUNTER — Emergency Department (HOSPITAL_COMMUNITY): Payer: BLUE CROSS/BLUE SHIELD

## 2014-09-28 DIAGNOSIS — J189 Pneumonia, unspecified organism: Principal | ICD-10-CM

## 2014-09-28 DIAGNOSIS — R0789 Other chest pain: Secondary | ICD-10-CM | POA: Diagnosis present

## 2014-09-28 DIAGNOSIS — D638 Anemia in other chronic diseases classified elsewhere: Secondary | ICD-10-CM | POA: Diagnosis present

## 2014-09-28 DIAGNOSIS — Z87891 Personal history of nicotine dependence: Secondary | ICD-10-CM

## 2014-09-28 DIAGNOSIS — Y95 Nosocomial condition: Secondary | ICD-10-CM | POA: Diagnosis present

## 2014-09-28 DIAGNOSIS — K219 Gastro-esophageal reflux disease without esophagitis: Secondary | ICD-10-CM | POA: Diagnosis present

## 2014-09-28 DIAGNOSIS — Z171 Estrogen receptor negative status [ER-]: Secondary | ICD-10-CM | POA: Diagnosis present

## 2014-09-28 DIAGNOSIS — J449 Chronic obstructive pulmonary disease, unspecified: Secondary | ICD-10-CM | POA: Diagnosis present

## 2014-09-28 DIAGNOSIS — C50419 Malignant neoplasm of upper-outer quadrant of unspecified female breast: Secondary | ICD-10-CM | POA: Diagnosis present

## 2014-09-28 DIAGNOSIS — C50412 Malignant neoplasm of upper-outer quadrant of left female breast: Secondary | ICD-10-CM

## 2014-09-28 DIAGNOSIS — G43909 Migraine, unspecified, not intractable, without status migrainosus: Secondary | ICD-10-CM | POA: Diagnosis present

## 2014-09-28 DIAGNOSIS — R0602 Shortness of breath: Secondary | ICD-10-CM

## 2014-09-28 DIAGNOSIS — R079 Chest pain, unspecified: Secondary | ICD-10-CM | POA: Diagnosis present

## 2014-09-28 DIAGNOSIS — E039 Hypothyroidism, unspecified: Secondary | ICD-10-CM | POA: Diagnosis present

## 2014-09-28 DIAGNOSIS — R509 Fever, unspecified: Secondary | ICD-10-CM

## 2014-09-28 DIAGNOSIS — D62 Acute posthemorrhagic anemia: Secondary | ICD-10-CM | POA: Diagnosis present

## 2014-09-28 LAB — CBC WITH DIFFERENTIAL/PLATELET
BASOS ABS: 0 10*3/uL (ref 0.0–0.1)
BASOS PCT: 0 % (ref 0–1)
Eosinophils Absolute: 0 10*3/uL (ref 0.0–0.7)
Eosinophils Relative: 0 % (ref 0–5)
HCT: 24.8 % — ABNORMAL LOW (ref 36.0–46.0)
HEMOGLOBIN: 8.3 g/dL — AB (ref 12.0–15.0)
Lymphocytes Relative: 3 % — ABNORMAL LOW (ref 12–46)
Lymphs Abs: 0.2 10*3/uL — ABNORMAL LOW (ref 0.7–4.0)
MCH: 33.6 pg (ref 26.0–34.0)
MCHC: 33.5 g/dL (ref 30.0–36.0)
MCV: 100.4 fL — ABNORMAL HIGH (ref 78.0–100.0)
Monocytes Absolute: 0.1 10*3/uL (ref 0.1–1.0)
Monocytes Relative: 2 % — ABNORMAL LOW (ref 3–12)
NEUTROS ABS: 6.4 10*3/uL (ref 1.7–7.7)
NEUTROS PCT: 95 % — AB (ref 43–77)
Platelets: 147 10*3/uL — ABNORMAL LOW (ref 150–400)
RBC: 2.47 MIL/uL — ABNORMAL LOW (ref 3.87–5.11)
RDW: 13 % (ref 11.5–15.5)
WBC: 6.7 10*3/uL (ref 4.0–10.5)

## 2014-09-28 LAB — COMPREHENSIVE METABOLIC PANEL
ALT: 10 U/L (ref 0–35)
AST: 14 U/L (ref 0–37)
Albumin: 3 g/dL — ABNORMAL LOW (ref 3.5–5.2)
Alkaline Phosphatase: 80 U/L (ref 39–117)
Anion gap: 10 (ref 5–15)
BUN: 10 mg/dL (ref 6–23)
CO2: 22 mmol/L (ref 19–32)
Calcium: 8.7 mg/dL (ref 8.4–10.5)
Chloride: 104 mmol/L (ref 96–112)
Creatinine, Ser: 0.84 mg/dL (ref 0.50–1.10)
GFR calc Af Amer: >90 mL/min (ref >90)
GFR calc non Af Amer: 78 mL/min — ABNORMAL LOW (ref >90)
Glucose, Bld: 131 mg/dL — ABNORMAL HIGH (ref 70–99)
Potassium: 3.9 mmol/L (ref 3.5–5.1)
Sodium: 136 mmol/L (ref 135–145)
Total Bilirubin: 0.6 mg/dL (ref 0.3–1.2)
Total Protein: 6.1 g/dL (ref 6.0–8.3)

## 2014-09-28 LAB — LACTIC ACID, PLASMA: Lactic Acid, Venous: 1.5 mmol/L (ref 0.5–2.0)

## 2014-09-28 MED ORDER — SODIUM CHLORIDE 0.9 % IV BOLUS (SEPSIS)
1000.0000 mL | Freq: Once | INTRAVENOUS | Status: AC
  Administered 2014-09-28: 1000 mL via INTRAVENOUS

## 2014-09-28 MED ORDER — DEXTROSE 5 % IV SOLN
1.0000 g | Freq: Once | INTRAVENOUS | Status: AC
  Administered 2014-09-28: 1 g via INTRAVENOUS
  Filled 2014-09-28: qty 1

## 2014-09-28 MED ORDER — VANCOMYCIN HCL IN DEXTROSE 1-5 GM/200ML-% IV SOLN
1000.0000 mg | Freq: Two times a day (BID) | INTRAVENOUS | Status: DC
  Administered 2014-09-29 – 2014-09-30 (×5): 1000 mg via INTRAVENOUS
  Filled 2014-09-28 (×7): qty 200

## 2014-09-28 MED ORDER — HYDROMORPHONE HCL 1 MG/ML IJ SOLN
1.0000 mg | INTRAMUSCULAR | Status: DC | PRN
  Filled 2014-09-28: qty 1

## 2014-09-28 MED ORDER — FENTANYL CITRATE 0.05 MG/ML IJ SOLN
50.0000 ug | Freq: Once | INTRAMUSCULAR | Status: AC
  Administered 2014-09-28: 50 ug via INTRAVENOUS
  Filled 2014-09-28: qty 2

## 2014-09-28 NOTE — ED Notes (Signed)
Pt is requesting pain medication. Audie Pinto, MD notified.

## 2014-09-28 NOTE — ED Notes (Signed)
Audie Pinto, MD at bedside.

## 2014-09-28 NOTE — ED Provider Notes (Signed)
CSN: [id number]     Arrival date & time 09/28/14  1843 History   First MD Initiated Contact with Patient 09/28/14 1934     Chief Complaint  Patient presents with  . Shortness of Breath      HPI  Expand All Collapse All   Pt c/o SOB. Pt recently had Bilateral spacer breast implant procedure Tuesday 09/25/14. Pt sts SOB started Wednesday night after she was discharged. Pt is currently receiving chemo. Pt has R chest port access. Pt received Albuterol/Atrovent and 125mg  Solumedrol enroute by EMS.        Past Medical History  Diagnosis Date  . Hypothyroidism   . Shortness of breath     2D ECHO, 04/20/2012 - EF >55%, normal; EXERCISE STRESS TEST, 04/20/2012 - normal, EKG negative for ischemia, no ECG changes  . COPD (chronic obstructive pulmonary disease)   . Emphysema   . Hematuria     cause unknow- saw Urologist  . Hot flashes   . Anginal pain     Dr Claiborne Billings cardiologist- test normal.  " think it is from breast cancer"  . Brain aneurysm      Dr Jannifer Franklin neurologist is monitoring  . Anxiety     Due to situation, CA breast  . Chronic bronchitis     "I've had it quite a few times" (04/02/2014)  . GERD (gastroesophageal reflux disease)   . Migraine     "treated w/botox for them" (04/02/2014)  . Wears glasses   . Wears dentures     top   Past Surgical History  Procedure Laterality Date  . Cholecystectomy    . Appendectomy    . Laparoscopic nissen fundoplication  02/1016    for GERD- not a problem  . Video bronchoscopy Bilateral 01/16/2013    Procedure: VIDEO BRONCHOSCOPY WITHOUT FLUORO;  Surgeon: [name], MD;  Location: [facility];  Service: Cardiopulmonary;  Laterality: Bilateral;  . Mastectomy complete / simple w/ sentinel node biopsy Left 04/02/2014    axillary SLN  . Mastectomy complete / simple Right 04/02/2014    PROPHYLACTIC  . Portacath placement Right 04/02/2014  . Breast biopsy Left 11/2013 X 3  . Total abdominal hysterectomy    . Cardiac catheterization  2014  .  Simple mastectomy with axillary sentinel node biopsy Bilateral 04/02/2014    Procedure: LEFT TOTAL MASTECTOMY WITH LEFT AXILLARY SENTINEL NODE BIOPSY, RIGHT PROPHYLACTIC MASTETCTOMY;  Surgeon: [name], MD;  Location: [place];  Service: General;  Laterality: Bilateral;  . Portacath placement N/A 04/02/2014    Procedure: INSERTION PORT-A-CATH;  Surgeon: [name], MD;  Location: [place];  Service: General;  Laterality: N/A;  . Left heart catheterization with coronary angiogram N/A 12/26/2012    Procedure: LEFT HEART CATHETERIZATION WITH CORONARY ANGIOGRAM;  Surgeon: Lorretta Harp, MD;  Location[facility];  Service: Cardiovascular;  Laterality: N/A;  . Breast reconstruction with placement of tissue expander and flex hd (acellular hydrated dermis) Bilateral 09/25/2014    Procedure: PLACEMENT OF BILATERAL TISSUE EXPANDER AND  ACELLULAR DERMIS FOR BREAST RECONSTRUCTION ;  Surgeon: [name], MD;  Location: [place];  Service: Plastics;  Laterality: Bilateral;   Family History  Problem Relation Age of Onset  . Heart failure Mother   . Colon cancer Father 56    stomach cancer also in 54s  . Throat cancer Brother 72    smoker  . Heart attack Maternal Grandmother   . Colon cancer Paternal Grandmother 13  . Cancer  Paternal Grandfather     kidney and bladder  . Throat cancer Brother 37    throat cancer, smoker  . Ovarian cancer Sister 18    ovarian cancer at 31, colorectal cancer at 82  . Breast cancer Paternal Aunt 24  . Ovarian cancer Other 32    niece with ovarian cancer   History  Substance Use Topics  . Smoking status: Former Smoker -- 0.00 packs/day for 37 years    Quit date: 03/29/2014  . Smokeless tobacco: Never Used     Comment: uses e-cig 01/03/13, starting using Chantix 11/13/13  . Alcohol Use: No   OB History    No data available     Review of Systems  Constitutional: Positive for fever and chills.  Cardiovascular: Negative for chest pain and  leg swelling.      Allergies  Contrast media and Prednisone  Home Medications   Prior to Admission medications   Medication Sig Start Date End Date Taking? Authorizing Provider  buPROPion (WELLBUTRIN XL) 300 MG 24 hr tablet Take 300 mg by mouth daily.   Yes Historical Provider, MD  DUONEB 0.5-2.5 (3) MG/3ML SOLN Inhale 3 mLs into the lungs every 6 (six) hours as needed (for shortness of breath).  09/17/14  Yes Historical Provider, MD  gabapentin (NEURONTIN) 600 MG tablet Take 150 mg by mouth 2 (two) times daily.   Yes Historical Provider, MD  HYDROcodone-acetaminophen (NORCO) 10-325 MG per tablet Take 1 tablet by mouth every 4 (four) hours as needed. Patient taking differently: Take 1 tablet by mouth every 4 (four) hours as needed for moderate pain.  09/25/14  Yes [name], MD  levothyroxine (SYNTHROID, LEVOTHROID) 75 MCG tablet Take 75 mcg by mouth daily before breakfast.   Yes Historical Provider, MD  lidocaine-prilocaine (EMLA) cream Apply 1 application topically as needed (for chemo injections).  08/21/14  Yes Historical Provider, MD  LORazepam (ATIVAN) 0.5 MG tablet Take 1 tablet (0.5 mg total) by mouth at bedtime as needed (Nausea or vomiting). Patient taking differently: Take 0.25 mg by mouth every 8 (eight) hours as needed for anxiety or sleep.  07/10/14  Yes Laurie Panda, NP  montelukast (SINGULAIR) 10 MG tablet Take 10 mg by mouth at bedtime.  11/09/13  Yes Historical Provider, MD  ondansetron (ZOFRAN) 8 MG tablet Take 8 mg by mouth as needed. For N/V 08/21/14  Yes Historical Provider, MD  PROAIR HFA 108 (90 BASE) MCG/ACT inhaler Inhale 2 puffs into the lungs every 4 (four) hours as needed for wheezing or shortness of breath.  01/19/13  Yes Historical Provider, MD  prochlorperazine (COMPAZINE) 10 MG tablet Take 10 mg by mouth as needed for nausea or vomiting. Takes after chemo injections 08/21/14  Yes Historical Provider, MD  rizatriptan (MAXALT-MLT) 10 MG disintegrating tablet  Take 1 tablet (10 mg total) by mouth as needed for migraine. May repeat in 2 hours if needed 03/14/14  Yes Marcial Pacas, MD  sulfamethoxazole-trimethoprim (BACTRIM DS,SEPTRA DS) 800-160 MG per tablet Take 1 tablet by mouth 2 (two) times daily. 09/25/14  Yes [name], MD  SUMAtriptan (IMITREX) 6 MG/0.5ML SOLN injection Inject 6 mg into the skin every 2 (two) hours as needed for migraine or headache. May repeat in 2 hours if headache persists or recurs. Max 2 doses in 24 hours   Yes Historical Provider, MD  tiZANidine (ZANAFLEX) 2 MG tablet Take 1 tablet (2 mg total) by mouth 2 (two) times daily as needed for muscle spasms. 09/26/14  Yes [name], MD  Trastuzumab (HERCEPTIN IV) Inject 462 mg into the vein every 21 ( twenty-one) days.   Yes Historical Provider, MD  traZODone (DESYREL) 100 MG tablet Take 1 tablet (100 mg total) by mouth at bedtime. 07/10/14  Yes Laurie Panda, NP  verapamil (CALAN-SR) 120 MG CR tablet TAKE 1 TABLET BY MOUTH AT BEDTIME ( MAX 30 DAY SUPPLY PER INSURANCE) 05/10/14  Yes Marcial Pacas, MD  gabapentin (NEURONTIN) 300 MG capsule Take 1 capsule (300 mg total) by mouth 2 (two) times daily. Patient taking differently: Take 150 mg by mouth 2 (two) times daily.  09/25/14   [name], MD   BP 109/62 mmHg  Pulse 104  Temp(Src) 98.7 F (37.1 C) (Oral)  Resp 33  SpO2 90% Physical Exam Physical Exam  Nursing note and vitals reviewed. Constitutional: She is oriented to person, place, and time. She appears well-developed and well-nourished. No distress.  HENT:  Head: Normocephalic and atraumatic.  Eyes: Pupils are equal, round, and reactive to light.  Neck: Normal range of motion.  Cardiovascular: Normal rate and intact distal pulses.   Pulmonary/Chest: No respiratory distress.  has decreased breath sound left lower base.  She has bilateral chest drains in which are draining pink tinged fluid which is normal according to the husband.  The postop wounds look clean  without evidence of infection. Abdominal: Normal appearance. She exhibits no distension.  Musculoskeletal: Normal range of motion.  Neurological: She is alert and oriented to person, place, and time. No cranial nerve deficit.  Skin: Skin is warm and dry. No rash noted.  Psychiatric: She has a normal mood and affect. Her behavior is normal.   ED Course  Procedures (including critical care time) Medications  vancomycin (VANCOCIN) IVPB 1000 mg/200 mL premix (not administered)  sodium chloride 0.9 % bolus 1,000 mL (1,000 mLs Intravenous New Bag/Given 09/28/14 2110)  fentaNYL (SUBLIMAZE) injection 50 mcg (50 mcg Intravenous Given 09/28/14 2155)  ceFEPIme (MAXIPIME) 1 g in dextrose 5 % 50 mL IVPB (1 g Intravenous New Bag/Given 09/28/14 2246)    Labs Review Labs Reviewed  COMPREHENSIVE METABOLIC PANEL - Abnormal; Notable for the following:    Glucose, Bld 131 (*)    Albumin 3.0 (*)    GFR calc non Af Amer 78 (*)    All other components within normal limits  CBC WITH DIFFERENTIAL/PLATELET - Abnormal; Notable for the following:    RBC 2.47 (*)    Hemoglobin 8.3 (*)    HCT 24.8 (*)    MCV 100.4 (*)    Platelets 147 (*)    Neutrophils Relative % 95 (*)    Lymphocytes Relative 3 (*)    Lymphs Abs 0.2 (*)    Monocytes Relative 2 (*)    All other components within normal limits  LACTIC ACID, PLASMA  URINALYSIS, ROUTINE W REFLEX MICROSCOPIC    Imaging Review Dg Chest 2 View  09/28/2014   CLINICAL DATA:  Fever for 2 days, breast expander placed September 25, 2014, history of breast cancer.  EXAM: CHEST  2 VIEW  COMPARISON:  Chest radiograph April 02, 2014  FINDINGS: Cardiac silhouette is upper limits of normal in size, unchanged. Mediastinal silhouette is nonsuspicious. Patchy RIGHT perihilar and retrocardiac airspace opacities with small LEFT pleural effusion. No pneumothorax. Similar scarring LEFT upper lobe.  Single-lumen RIGHT chest Port-A-Cath with distal tip projecting in mid to distal  superior vena cava. Bilateral breast expanders and surgical drains in place with surgical clips. Osseous structures  are nonsuspicious. Surgical clips at the GE junction.  IMPRESSION: RIGHT perihilar, retrocardiac airspace opacities with small LEFT pleural effusion, findings are concerning for pneumonia though are nonspecific and, follow up chest radiograph is recommended after treatment to verify improvement.  Bilateral breast expander and surgical drains in place.   Electronically Signed   By: Elon Alas   On: 09/28/2014 21:35     EKG Interpretation   Date/Time:  Friday September 28 2014 18:57:11 EST Ventricular Rate:  109 PR Interval:  125 QRS Duration: 104 QT Interval:  333 QTC Calculation: 448 R Axis:   84 Text Interpretation:  Sinus tachycardia RSR' in V1 or V2, right VCD or RVH  Borderline T abnormalities, anterior leads Baseline wander in lead(s) V1  Abnormal ekg Confirmed by Audie Pinto  MD, Denicia Pagliarulo ([id number]) on 09/28/2014 8:15:24  PM      MDM   Final diagnoses:  Fever  Hospital-acquired pneumonia        Leonard Schwartz, MD 09/28/14 2324

## 2014-09-28 NOTE — ED Notes (Signed)
Pt c/o SOB. Pt recently had  Bilateral spacer breast implant procedure Tuesday 09/25/14. Pt sts SOB started Wednesday night after she was discharged. Pt is currently receiving chemo. Pt has R chest port access. Pt received Albuterol/Atrovent and 125mg  Solumedrol enroute by EMS.

## 2014-09-28 NOTE — ED Notes (Signed)
Pt's husband states that the pt puts out between 30-45mL in each drain daily, and that the fluid is always bright red.

## 2014-09-28 NOTE — Progress Notes (Signed)
Report received from ED RN.

## 2014-09-28 NOTE — Progress Notes (Signed)
ANTIBIOTIC CONSULT NOTE - INITIAL  Pharmacy Consult for Vancomycin Indication: rule out pneumonia  Allergies  Allergen Reactions  . Contrast Media [Iodinated Diagnostic Agents] Anaphylaxis    Heart stops and breathing stops; patient ok with 13 hour prep  . Prednisone Swelling and Palpitations    Benadryl prophylaxis    Patient Measurements:   Actual Body Weight: 78.1 kg  Vital Signs: Temp: 98.7 F (37.1 C) (03/11 1947) Temp Source: Oral (03/11 1947) BP: 116/60 mmHg (03/11 2200) Pulse Rate: 96 (03/11 2200) Intake/Output from previous day:   Intake/Output from this shift: Total I/O In: -  Out: 57 [Drains:57]  Labs:  Recent Labs  09/28/14 2024  WBC 6.7  HGB 8.3*  PLT 147*  CREATININE 0.84   Estimated Creatinine Clearance: 81.7 mL/min (by C-G formula based on Cr of 0.84). No results for input(s): VANCOTROUGH, VANCOPEAK, VANCORANDOM, GENTTROUGH, GENTPEAK, GENTRANDOM, TOBRATROUGH, TOBRAPEAK, TOBRARND, AMIKACINPEAK, AMIKACINTROU, AMIKACIN in the last 72 hours.   Microbiology: No results found for this or any previous visit (from the past 720 hour(s)).  Medical History: Past Medical History  Diagnosis Date  . Hypothyroidism   . Shortness of breath     2D ECHO, 04/20/2012 - EF >55%, normal; EXERCISE STRESS TEST, 04/20/2012 - normal, EKG negative for ischemia, no ECG changes  . COPD (chronic obstructive pulmonary disease)   . Emphysema   . Hematuria     cause unknow- saw Urologist  . Hot flashes   . Anginal pain     Dr Claiborne Billings cardiologist- test normal.  " think it is from breast cancer"  . Brain aneurysm      Dr Jannifer Franklin neurologist is monitoring  . Anxiety     Due to situation, CA breast  . Chronic bronchitis     "I've had it quite a few times" (04/02/2014)  . GERD (gastroesophageal reflux disease)   . Migraine     "treated w/botox for them" (04/02/2014)  . Wears glasses   . Wears dentures     top    Medications:   (Not in a hospital admission) Scheduled:    Infusions:  . ceFEPime (MAXIPIME) IV     Assessment: 54yo female presents with SOB. Pharmacy is consulted to dose vancomycin for rule out pneumonia. Pt is afebrile, WBC, sCr and LA wnl.   Pt received cefepime 1g IV once in the ED.  Goal of Therapy:  Vancomycin trough level 15-20 mcg/ml  Plan:  Vancomycin 1g IV q12h Recommend continuing cefepime 1g IV q8h Measure antibiotic drug levels at steady state Follow up culture results, renal function, and clinical course  Andrey Cota. Diona Foley, PharmD Clinical Pharmacist Pager [phone] 09/28/2014,10:16 PM

## 2014-09-29 DIAGNOSIS — J189 Pneumonia, unspecified organism: Principal | ICD-10-CM

## 2014-09-29 DIAGNOSIS — C50412 Malignant neoplasm of upper-outer quadrant of left female breast: Secondary | ICD-10-CM

## 2014-09-29 DIAGNOSIS — R0789 Other chest pain: Secondary | ICD-10-CM

## 2014-09-29 LAB — URINALYSIS, ROUTINE W REFLEX MICROSCOPIC
BILIRUBIN URINE: NEGATIVE
Glucose, UA: 100 mg/dL — AB
Ketones, ur: NEGATIVE mg/dL
Leukocytes, UA: NEGATIVE
Nitrite: NEGATIVE
PH: 5.5 (ref 5.0–8.0)
Protein, ur: NEGATIVE mg/dL
SPECIFIC GRAVITY, URINE: 1.01 (ref 1.005–1.030)
Urobilinogen, UA: 1 mg/dL (ref 0.0–1.0)

## 2014-09-29 LAB — BODY FLUID CELL COUNT WITH DIFFERENTIAL
Eos, Fluid: 0 %
Eos, Fluid: 0 %
Lymphs, Fluid: 1 %
Lymphs, Fluid: 1 %
MONOCYTE-MACROPHAGE-SEROUS FLUID: 0 % — AB (ref 50–90)
Monocyte-Macrophage-Serous Fluid: 0 % — ABNORMAL LOW (ref 50–90)
NEUTROPHIL FLUID: 99 % — AB (ref 0–25)
NEUTROPHIL FLUID: 99 % — AB (ref 0–25)
WBC FLUID: 14400 uL — AB (ref 0–1000)
WBC FLUID: 18000 uL — AB (ref 0–1000)

## 2014-09-29 LAB — URINE MICROSCOPIC-ADD ON

## 2014-09-29 LAB — TROPONIN I: Troponin I: <0.03 ng/mL (ref <0.031)

## 2014-09-29 LAB — STREP PNEUMONIAE URINARY ANTIGEN: Strep Pneumo Urinary Antigen: NEGATIVE

## 2014-09-29 LAB — GLUCOSE, CAPILLARY: GLUCOSE-CAPILLARY: 164 mg/dL — AB (ref 70–99)

## 2014-09-29 MED ORDER — BISACODYL 10 MG RE SUPP: 10.0000 mg | Freq: Once | RECTAL | Status: DC

## 2014-09-29 MED ORDER — IPRATROPIUM-ALBUTEROL 0.5-2.5 (3) MG/3ML IN SOLN
3.0000 mL | Freq: Four times a day (QID) | RESPIRATORY_TRACT | Status: DC | PRN
Start: 2014-09-29 — End: 2014-10-01

## 2014-09-29 MED ORDER — SODIUM CHLORIDE 0.9 % IV SOLN
INTRAVENOUS | Status: DC
  Administered 2014-09-29: 02:00:00 via INTRAVENOUS

## 2014-09-29 MED ORDER — SODIUM CHLORIDE 0.9 % IV SOLN
INTRAVENOUS | Status: DC
Start: 2014-09-29 — End: 2014-09-30
  Administered 2014-09-30: 06:00:00 via INTRAVENOUS

## 2014-09-29 MED ORDER — ONDANSETRON HCL 4 MG PO TABS: 8.0000 mg | ORAL_TABLET | ORAL | Status: DC | PRN

## 2014-09-29 MED ORDER — DEXTROSE 5 % IV SOLN
1.0000 g | Freq: Three times a day (TID) | INTRAVENOUS | Status: DC
  Administered 2014-09-29 – 2014-10-01 (×7): 1 g via INTRAVENOUS
  Filled 2014-09-29 (×9): qty 1

## 2014-09-29 MED ORDER — TRAZODONE HCL 100 MG PO TABS
100.0000 mg | ORAL_TABLET | Freq: Every day | ORAL | Status: DC
  Administered 2014-09-29 – 2014-09-30 (×3): 100 mg via ORAL
  Filled 2014-09-29 (×4): qty 1

## 2014-09-29 MED ORDER — GABAPENTIN 250 MG/5ML PO SOLN
150.0000 mg | Freq: Two times a day (BID) | ORAL | Status: DC
  Administered 2014-09-29 (×2): 150 mg via ORAL
  Filled 2014-09-29 (×3): qty 3

## 2014-09-29 MED ORDER — VERAPAMIL HCL ER 120 MG PO TBCR
120.0000 mg | EXTENDED_RELEASE_TABLET | Freq: Every day | ORAL | Status: DC
  Administered 2014-09-29 – 2014-09-30 (×3): 120 mg via ORAL
  Filled 2014-09-29 (×5): qty 1

## 2014-09-29 MED ORDER — GABAPENTIN 300 MG PO CAPS
300.0000 mg | ORAL_CAPSULE | Freq: Two times a day (BID) | ORAL | Status: DC
  Administered 2014-09-29 – 2014-10-01 (×4): 300 mg via ORAL
  Filled 2014-09-29 (×5): qty 1

## 2014-09-29 MED ORDER — LEVOTHYROXINE SODIUM 75 MCG PO TABS
75.0000 ug | ORAL_TABLET | Freq: Every day | ORAL | Status: DC
  Administered 2014-09-29 – 2014-10-01 (×3): 75 ug via ORAL
  Filled 2014-09-29 (×4): qty 1

## 2014-09-29 MED ORDER — HYDROMORPHONE HCL 1 MG/ML IJ SOLN
0.5000 mg | INTRAMUSCULAR | Status: DC | PRN
Start: 2014-09-29 — End: 2014-10-01

## 2014-09-29 MED ORDER — OXYCODONE HCL 5 MG PO TABS
5.0000 mg | ORAL_TABLET | ORAL | Status: DC | PRN
Start: 2014-09-29 — End: 2014-10-01
  Administered 2014-09-29 – 2014-10-01 (×6): 10 mg via ORAL
  Administered 2014-10-01: 5 mg via ORAL
  Filled 2014-09-29 (×3): qty 2
  Filled 2014-09-29: qty 1
  Filled 2014-09-29 (×3): qty 2

## 2014-09-29 MED ORDER — BUPROPION HCL ER (XL) 300 MG PO TB24
300.0000 mg | ORAL_TABLET | Freq: Every day | ORAL | Status: DC
  Administered 2014-09-29 – 2014-10-01 (×3): 300 mg via ORAL
  Filled 2014-09-29 (×3): qty 1

## 2014-09-29 MED ORDER — DOCUSATE SODIUM 100 MG PO CAPS: 200.0000 mg | ORAL_CAPSULE | Freq: Two times a day (BID) | ORAL | Status: DC

## 2014-09-29 MED ORDER — SODIUM CHLORIDE 0.9 % IV SOLN: INTRAVENOUS | Status: DC

## 2014-09-29 MED ORDER — HEPARIN SODIUM (PORCINE) 5000 UNIT/ML IJ SOLN
5000.0000 [IU] | Freq: Three times a day (TID) | INTRAMUSCULAR | Status: DC
  Administered 2014-09-29 – 2014-10-01 (×8): 5000 [IU] via SUBCUTANEOUS
  Filled 2014-09-29 (×11): qty 1

## 2014-09-29 MED ORDER — SENNOSIDES-DOCUSATE SODIUM 8.6-50 MG PO TABS: 1.0000 | ORAL_TABLET | Freq: Every day | ORAL | Status: DC

## 2014-09-29 MED ORDER — SODIUM CHLORIDE 0.9 % IV SOLN
INTRAVENOUS | Status: DC
  Administered 2014-09-29: 09:00:00 via INTRAVENOUS

## 2014-09-29 MED ORDER — TIZANIDINE HCL 2 MG PO TABS
2.0000 mg | ORAL_TABLET | Freq: Two times a day (BID) | ORAL | Status: DC | PRN
  Filled 2014-09-29 (×2): qty 1

## 2014-09-29 MED ORDER — ALBUTEROL SULFATE HFA 108 (90 BASE) MCG/ACT IN AERS: 2.0000 | INHALATION_SPRAY | RESPIRATORY_TRACT | Status: DC | PRN

## 2014-09-29 MED ORDER — ALBUTEROL SULFATE (2.5 MG/3ML) 0.083% IN NEBU: 3.0000 mL | INHALATION_SOLUTION | RESPIRATORY_TRACT | Status: DC | PRN

## 2014-09-29 MED ORDER — HYDROMORPHONE HCL 1 MG/ML IJ SOLN: 0.5000 mg | INTRAMUSCULAR | Status: DC | PRN

## 2014-09-29 MED ORDER — SENNOSIDES-DOCUSATE SODIUM 8.6-50 MG PO TABS: 1.0000 | ORAL_TABLET | Freq: Every evening | ORAL | Status: DC | PRN

## 2014-09-29 MED ORDER — HYDROCODONE-ACETAMINOPHEN 10-325 MG PO TABS
1.0000 | ORAL_TABLET | ORAL | Status: DC | PRN
  Administered 2014-09-29 (×2): 1 via ORAL
  Filled 2014-09-29 (×2): qty 1

## 2014-09-29 MED ORDER — KETOROLAC TROMETHAMINE 15 MG/ML IJ SOLN
15.0000 mg | Freq: Three times a day (TID) | INTRAMUSCULAR | Status: DC
  Administered 2014-09-29 (×2): 15 mg via INTRAVENOUS
  Filled 2014-09-29 (×3): qty 1

## 2014-09-29 MED ORDER — BACLOFEN 10 MG PO TABS
10.0000 mg | ORAL_TABLET | Freq: Three times a day (TID) | ORAL | Status: DC | PRN
  Filled 2014-09-29: qty 1

## 2014-09-29 MED ORDER — MONTELUKAST SODIUM 10 MG PO TABS
10.0000 mg | ORAL_TABLET | Freq: Every day | ORAL | Status: DC
  Administered 2014-09-29 – 2014-09-30 (×3): 10 mg via ORAL
  Filled 2014-09-29 (×5): qty 1

## 2014-09-29 MED ORDER — OXYCODONE HCL 5 MG PO TABS: 5.0000 mg | ORAL_TABLET | ORAL | Status: DC | PRN

## 2014-09-29 MED ORDER — BISACODYL 10 MG RE SUPP
10.0000 mg | Freq: Every day | RECTAL | Status: DC
  Filled 2014-09-29: qty 1

## 2014-09-29 MED ORDER — SUMATRIPTAN SUCCINATE 100 MG PO TABS
100.0000 mg | ORAL_TABLET | Freq: Two times a day (BID) | ORAL | Status: DC | PRN
  Filled 2014-09-29: qty 1

## 2014-09-29 MED ORDER — LORAZEPAM 0.5 MG PO TABS
0.2500 mg | ORAL_TABLET | Freq: Three times a day (TID) | ORAL | Status: DC | PRN
  Administered 2014-09-29 – 2014-09-30 (×2): 0.25 mg via ORAL
  Filled 2014-09-29 (×2): qty 1

## 2014-09-29 MED ORDER — POLYETHYLENE GLYCOL 3350 17 G PO PACK
17.0000 g | PACK | Freq: Two times a day (BID) | ORAL | Status: DC
  Filled 2014-09-29 (×6): qty 1

## 2014-09-29 NOTE — ED Notes (Signed)
Transporting patient to new room assignment. 

## 2014-09-29 NOTE — Progress Notes (Signed)
Patient admitted through ED with the said diagnosis. Alert and oriented x4, two jp drain in situ to recent breast surgery. Placed on cardiac monitoring and oriented to the room.Call bell placed within reach and husband at bedside.

## 2014-09-29 NOTE — H&P (Addendum)
[facility] History and Physical  Stacy Fernandez IZT:[id number] DOB: 24-Mar-1961 DOA: 09/28/2014  Referring physician: EDP PCP: Florina Ou, MD   Chief Complaint: SOB   HPI: Stacy Fernandez is a 54 y.o. female s/p placement of tissue expanders for reconstructive surgery of the breasts on 09/25/14, still undergoing chemotherapy with herceptin for Stage 1A BRCA next herceptin dose scheduled for this coming Tuesday.  Patient presents to the ED with SOB.  Symptoms onset Wed evening after discharge.  Have been worsening.  There is also worsening chest pain which she associates with the surgical sites that has been worse today.  She has been putting out between 30-40 ml of bright red fluid in each of the 2 JP drain since surgery.   Review of Systems: Systems reviewed.  As above, otherwise negative  Past Medical History  Diagnosis Date  . Hypothyroidism   . Shortness of breath     2D ECHO, 04/20/2012 - EF >55%, normal; EXERCISE STRESS TEST, 04/20/2012 - normal, EKG negative for ischemia, no ECG changes  . COPD (chronic obstructive pulmonary disease)   . Emphysema   . Hematuria     cause unknow- saw Urologist  . Hot flashes   . Anginal pain     Dr Claiborne Billings cardiologist- test normal.  " think it is from breast cancer"  . Brain aneurysm      Dr Jannifer Franklin neurologist is monitoring  . Anxiety     Due to situation, CA breast  . Chronic bronchitis     "I've had it quite a few times" (04/02/2014)  . GERD (gastroesophageal reflux disease)   . Migraine     "treated w/botox for them" (04/02/2014)  . Wears glasses   . Wears dentures     top   Past Surgical History  Procedure Laterality Date  . Cholecystectomy    . Appendectomy    . Laparoscopic nissen fundoplication  09/8248    for GERD- not a problem  . Video bronchoscopy Bilateral 01/16/2013    Procedure: VIDEO BRONCHOSCOPY WITHOUT FLUORO;  Surgeon: [name], MD;  Location: [facility];  Service: Cardiopulmonary;  Laterality:  Bilateral;  . Mastectomy complete / simple w/ sentinel node biopsy Left 04/02/2014    axillary SLN  . Mastectomy complete / simple Right 04/02/2014    PROPHYLACTIC  . Portacath placement Right 04/02/2014  . Breast biopsy Left 11/2013 X 3  . Total abdominal hysterectomy    . Cardiac catheterization  2014  . Simple mastectomy with axillary sentinel node biopsy Bilateral 04/02/2014    Procedure: LEFT TOTAL MASTECTOMY WITH LEFT AXILLARY SENTINEL NODE BIOPSY, RIGHT PROPHYLACTIC MASTETCTOMY;  Surgeon: [name], MD;  Location: [place];  Service: General;  Laterality: Bilateral;  . Portacath placement N/A 04/02/2014    Procedure: INSERTION PORT-A-CATH;  Surgeon: [name], MD;  Location: [place];  Service: General;  Laterality: N/A;  . Left heart catheterization with coronary angiogram N/A 12/26/2012    Procedure: LEFT HEART CATHETERIZATION WITH CORONARY ANGIOGRAM;  Surgeon: Lorretta Harp, MD;  Location: [facility] CATH LAB;  Service: Cardiovascular;  Laterality: N/A;  . Breast reconstruction with placement of tissue expander and flex hd (acellular hydrated dermis) Bilateral 09/25/2014    Procedure: PLACEMENT OF BILATERAL TISSUE EXPANDER AND  ACELLULAR DERMIS FOR BREAST RECONSTRUCTION ;  Surgeon: [name], MD;  Location: [place];  Service: Plastics;  Laterality: Bilateral;   Social History:  reports that she quit smoking about 6 months ago. She has never used  smokeless tobacco. She reports that she does not drink alcohol or use illicit drugs.  Allergies  Allergen Reactions  . Contrast Media [Iodinated Diagnostic Agents] Anaphylaxis    Heart stops and breathing stops; patient ok with 13 hour prep  . Prednisone Swelling and Palpitations    Benadryl prophylaxis    Family History  Problem Relation Age of Onset  . Heart failure Mother   . Colon cancer Father 50    stomach cancer also in 45s  . Throat cancer Brother 67    smoker  . Heart attack Maternal Grandmother   . Colon  cancer Paternal Grandmother 85  . Cancer Paternal Grandfather     kidney and bladder  . Throat cancer Brother 37    throat cancer, smoker  . Ovarian cancer Sister 23    ovarian cancer at 19, colorectal cancer at 99  . Breast cancer Paternal Aunt 39  . Ovarian cancer Other 1    niece with ovarian cancer     Prior to Admission medications   Medication Sig Start Date End Date Taking? Authorizing Provider  buPROPion (WELLBUTRIN XL) 300 MG 24 hr tablet Take 300 mg by mouth daily.   Yes Historical Provider, MD  DUONEB 0.5-2.5 (3) MG/3ML SOLN Inhale 3 mLs into the lungs every 6 (six) hours as needed (for shortness of breath).  09/17/14  Yes Historical Provider, MD  gabapentin (NEURONTIN) 600 MG tablet Take 150 mg by mouth 2 (two) times daily.   Yes Historical Provider, MD  HYDROcodone-acetaminophen (NORCO) 10-325 MG per tablet Take 1 tablet by mouth every 4 (four) hours as needed. Patient taking differently: Take 1 tablet by mouth every 4 (four) hours as needed for moderate pain.  09/25/14  Yes [name], MD  levothyroxine (SYNTHROID, LEVOTHROID) 75 MCG tablet Take 75 mcg by mouth daily before breakfast.   Yes Historical Provider, MD  lidocaine-prilocaine (EMLA) cream Apply 1 application topically as needed (for chemo injections).  08/21/14  Yes Historical Provider, MD  LORazepam (ATIVAN) 0.5 MG tablet Take 1 tablet (0.5 mg total) by mouth at bedtime as needed (Nausea or vomiting). Patient taking differently: Take 0.25 mg by mouth every 8 (eight) hours as needed for anxiety or sleep.  07/10/14  Yes Laurie Panda, NP  montelukast (SINGULAIR) 10 MG tablet Take 10 mg by mouth at bedtime.  11/09/13  Yes Historical Provider, MD  ondansetron (ZOFRAN) 8 MG tablet Take 8 mg by mouth as needed. For N/V 08/21/14  Yes Historical Provider, MD  PROAIR HFA 108 (90 BASE) MCG/ACT inhaler Inhale 2 puffs into the lungs every 4 (four) hours as needed for wheezing or shortness of breath.  01/19/13  Yes Historical  Provider, MD  prochlorperazine (COMPAZINE) 10 MG tablet Take 10 mg by mouth as needed for nausea or vomiting. Takes after chemo injections 08/21/14  Yes Historical Provider, MD  rizatriptan (MAXALT-MLT) 10 MG disintegrating tablet Take 1 tablet (10 mg total) by mouth as needed for migraine. May repeat in 2 hours if needed 03/14/14  Yes Marcial Pacas, MD  sulfamethoxazole-trimethoprim (BACTRIM DS,SEPTRA DS) 800-160 MG per tablet Take 1 tablet by mouth 2 (two) times daily. 09/25/14  Yes [name], MD  SUMAtriptan (IMITREX) 6 MG/0.5ML SOLN injection Inject 6 mg into the skin every 2 (two) hours as needed for migraine or headache. May repeat in 2 hours if headache persists or recurs. Max 2 doses in 24 hours   Yes Historical Provider, MD  tiZANidine (ZANAFLEX) 2 MG tablet Take 1  tablet (2 mg total) by mouth 2 (two) times daily as needed for muscle spasms. 09/26/14  Yes [name], MD  Trastuzumab (HERCEPTIN IV) Inject 462 mg into the vein every 21 ( twenty-one) days.   Yes Historical Provider, MD  traZODone (DESYREL) 100 MG tablet Take 1 tablet (100 mg total) by mouth at bedtime. 07/10/14  Yes Laurie Panda, NP  verapamil (CALAN-SR) 120 MG CR tablet TAKE 1 TABLET BY MOUTH AT BEDTIME ( MAX 30 DAY SUPPLY PER INSURANCE) 05/10/14  Yes Marcial Pacas, MD  gabapentin (NEURONTIN) 300 MG capsule Take 1 capsule (300 mg total) by mouth 2 (two) times daily. Patient taking differently: Take 150 mg by mouth 2 (two) times daily.  09/25/14   [name], MD   Physical Exam: Filed Vitals:   09/28/14 2330  BP: 122/63  Pulse: 101  Temp:   Resp: 29    BP 122/63 mmHg  Pulse 101  Temp(Src) 98.7 F (37.1 C) (Oral)  Resp 29  SpO2 92%  General Appearance:    Alert, oriented, no distress, appears stated age  Head:    Normocephalic, atraumatic  Eyes:    PERRL, EOMI, sclera non-icteric        Nose:   Nares without drainage or epistaxis. Mucosa, turbinates normal  Throat:   Moist mucous membranes. Oropharynx  without erythema or exudate.  Neck:   Supple. No carotid bruits.  No thyromegaly.  No lymphadenopathy.   Back:     No CVA tenderness, no spinal tenderness  Lungs:     Clear to auscultation bilaterally, without wheezes, rhonchi or rales  Chest wall:    No tenderness to palpitation  Heart:    Regular rate and rhythm without murmurs, gallops, rubs  Abdomen:     Soft, non-tender, nondistended, normal bowel sounds, no organomegaly  Genitalia:    deferred  Rectal:    deferred  Extremities:   No clubbing, cyanosis or edema.  Pulses:   2+ and symmetric all extremities  Skin:   Skin color, texture, turgor normal, no rashes or lesions, surgical site wounds are C/D/I, JP drainage with sanguinous output.  Lymph nodes:   Cervical, supraclavicular, and axillary nodes normal  Neurologic:   CNII-XII intact. Normal strength, sensation and reflexes      throughout    Labs on Admission:  Basic Metabolic Panel:  Recent Labs Lab 09/28/14 2024  NA 136  K 3.9  CL 104  CO2 22  GLUCOSE 131*  BUN 10  CREATININE 0.84  CALCIUM 8.7   Liver Function Tests:  Recent Labs Lab 09/28/14 2024  AST 14  ALT 10  ALKPHOS 80  BILITOT 0.6  PROT 6.1  ALBUMIN 3.0*   No results for input(s): LIPASE, AMYLASE in the last 168 hours. No results for input(s): AMMONIA in the last 168 hours. CBC:  Recent Labs Lab 09/25/14 0719 09/28/14 2024  WBC  --  6.7  NEUTROABS  --  6.4  HGB 10.8* 8.3*  HCT  --  24.8*  MCV  --  100.4*  PLT  --  147*   Cardiac Enzymes: No results for input(s): CKTOTAL, CKMB, CKMBINDEX, TROPONINI in the last 168 hours.  BNP (last 3 results) No results for input(s): PROBNP in the last 8760 hours. CBG: No results for input(s): GLUCAP in the last 168 hours.  Radiological Exams on Admission: Dg Chest 2 View  09/28/2014   CLINICAL DATA:  Fever for 2 days, breast expander placed September 25, 2014, history of breast  cancer.  EXAM: CHEST  2 VIEW  COMPARISON:  Chest radiograph April 02, 2014  FINDINGS: Cardiac silhouette is upper limits of normal in size, unchanged. Mediastinal silhouette is nonsuspicious. Patchy RIGHT perihilar and retrocardiac airspace opacities with small LEFT pleural effusion. No pneumothorax. Similar scarring LEFT upper lobe.  Single-lumen RIGHT chest Port-A-Cath with distal tip projecting in mid to distal superior vena cava. Bilateral breast expanders and surgical drains in place with surgical clips. Osseous structures are nonsuspicious. Surgical clips at the GE junction.  IMPRESSION: RIGHT perihilar, retrocardiac airspace opacities with small LEFT pleural effusion, findings are concerning for pneumonia though are nonspecific and, follow up chest radiograph is recommended after treatment to verify improvement.  Bilateral breast expander and surgical drains in place.   Electronically Signed   By: Elon Alas   On: 09/28/2014 21:35    EKG: Independently reviewed.  Assessment/Plan Principal Problem:   Hospital-acquired pneumonia Active Problems:   Chest pain   Breast cancer of upper-outer quadrant of left female breast   1. HCAP - 1. PNA pathway 2. Cultures pending 3. Cefepime and vanc 4. Tele monitor 5. Also evaluating for wound infection as below. 2. Chest pain - most likely post operative pain 1. Will get single troponin value 2. Pain control with norco which had been working at home 3. Will send fluid from JP drains for culture and cell count 4. No obvious signs of infection on physical exam, does have increased pain at the surgical sites however.  The cefepime and vanc we are putting her on for HCAP coverage would empirically cover most post-operative infections as well with the one exception of anaerobes. 5. Call surgeon in AM (courtesy consult at this point as I have no objective evidence pointing to infection of the surgical site at this time), to the surgeon Dr. Iran Planas who happens to be on call for the inpatient wound care service this  week. 3. BRCA - is scheduled for next herceptin on Tuesday, will need to talk with oncology to see if this HCAP will end up delaying treatment or if she can be treated with herceptin even if she is ill with HCAP. 4. Anemia - small amount of acute blood loss on top of anemia of chronic disease and chemotherapy.  HGB stable today at 8.3.    Code Status: Full Code  Family Communication: Husband at bedside Disposition Plan: Admit to inpatient   Time spent: 70 min  Ganon Demasi M. [facility] Pager [phone]  If 7AM-7PM, please contact the day team taking care of the patient [email or web address] Password [facility] 09/29/2014, 12:05 AM

## 2014-09-29 NOTE — Progress Notes (Signed)
POD# 4 delayed breast reconstruction with tissue expanders, acellular dermis  Events noted I had spoken with patient afternoon of admission for pain, constipation. Has not had BM since surgery. Was on hydrocodone for pain and her home muscle relaxants; was not using any NSAIDs. States her muscle relaxant was not helping surgical pain. Reports fever to 102 at home. Was confused at home.   Temp:  [97.7 F (36.5 C)-99.3 F (37.4 C)] 97.7 F (36.5 C) (03/12 0458) Pulse Rate:  [87-110] 87 (03/12 0459) Resp:  [18-33] 18 (03/12 0458) BP: (96-127)/(52-66) 119/61 mmHg (03/12 0458) SpO2:  [88 %-98 %] 91 % (03/12 0459)   WBC normal.  Drains R L 50 R 17 PO 240  On RA- reports SOB with walking Has to sit up to limit chest pain  PE Alert, oriented Chest soft, flat, no cellulitis JPs serosanguinous  A/P Surgical healing/chest and drain output and color all appear normal for time post procedure. In this setting the culture of fluid from JP drains I would not treat, as these are contaminated by nature.  D/c Zanaflex, added baclofen PRN- pt understands she needs to ask of this D/c hydrocodone, added oxycodone PRN. Reports good effect with Dilaudid in past; this was added IV for severe pain PRN.  Add bowel regimen, suppository today, Miralax ordered and senna/docusate at night Added toradol for pain, should try ibuprofen ATC at home Anemia baseline, current appropriate for her time post procedure. Rec prenatal vitamins at home.  If she continues to be SOB, rec CT angio r/o PE. On heparin DVT px  Being treated empirically for PNA. Certainly given her COPD, recent surgery and poor pain control is at risk for this. Incentive spirometry- was using at home, ordered for bedside.   [name], MD [facility] Plastic [facility] Reconstructive Surgery [phone]

## 2014-09-29 NOTE — Progress Notes (Signed)
CONSULT NOTE  - GABAPENTIN DOSING  Spoke with patient and she was confusing her Wellbutrin and gabapentin dosing which is why she thought she was taking 150 mg bid. She has 300 mg caps and is taking 300 mg bid. It was prescribed qid but she only takes bid.  [place], Pharm.D., BCPS Clinical Pharmacist Pager: [phone] 09/29/2014 1:50 PM

## 2014-09-29 NOTE — Progress Notes (Signed)
Patient Demographics  Stacy Fernandez, is a 54 y.o. female, DOB - Feb 15, 1961, IRJ:[id number]  Admit date - 09/28/2014   Admitting Physician Etta Quill, DO  Outpatient Primary MD for the patient is Florina Ou, MD  LOS - 1   Chief Complaint  Patient presents with  . Shortness of Breath        Subjective:   Stacy Fernandez today has, No headache, +ve chest pain, No abdominal pain - No Nausea, No new weakness tingling or numbness, No Cough - SOB.    Assessment & Plan    1.HCAP - continue empiric antibiotics follow cultures, continue supportive care with oxygen and nebulizer treatments.   2. Ongoing chest wall pain. Recent bilateral mastectomy done by Dr. Dalbert Batman, recent breast reconstruction surgery done by plastic surgeon Dr.Thimmappa - have reconsulted Plastic Surgery, antibiotics for #1 above should suffice in case there is an infection. Supportive care to continue.   3. Breast cancer. Date is post bilateral mastectomy, she is scheduled for next herceptin on Tuesday, for now hold chemotherapy until infectious issues have resolved, she follows with Dr. Jana Hakim.   4. Anemia of chronic disease. Stable no need for transfusion.  Code Status: full  Family Communication: husband  Disposition Plan: Home   Procedures     Consults  Plastic Surgery   Medications  Scheduled Meds: . bisacodyl  10 mg Rectal Daily  . buPROPion  300 mg Oral Daily  . ceFEPime (MAXIPIME) IV  1 g Intravenous 3 times per day  . gabapentin  150 mg Oral Q12H  . heparin  5,000 Units Subcutaneous 3 times per day  . ketorolac  15 mg Intravenous 3 times per day  . levothyroxine  75 mcg Oral QAC breakfast  . montelukast  10 mg Oral QHS  . polyethylene glycol  17 g Oral BID  . traZODone  100 mg Oral QHS  .  vancomycin  1,000 mg Intravenous Q12H  . verapamil  120 mg Oral QHS   Continuous Infusions: . sodium chloride     PRN Meds:.albuterol, baclofen, HYDROmorphone (DILAUDID) injection, ipratropium-albuterol, LORazepam, ondansetron, oxyCODONE, senna-docusate, SUMAtriptan  DVT Prophylaxis    Heparin    Lab Results  Component Value Date   PLT 147* 09/28/2014    Antibiotics     Anti-infectives    Start     Dose/Rate Route Frequency Ordered Stop   09/29/14 0600  ceFEPIme (MAXIPIME) 1 g in dextrose 5 % 50 mL IVPB     1 g 100 mL/hr over 30 Minutes Intravenous 3 times per day 09/29/14 0000 10/07/14 0559   09/28/14 2230  ceFEPIme (MAXIPIME) 1 g in dextrose 5 % 50 mL IVPB     1 g 100 mL/hr over 30 Minutes Intravenous  Once 09/28/14 2215 09/28/14 2316   09/28/14 2230  vancomycin (VANCOCIN) IVPB 1000 mg/200 mL premix     1,000 mg 200 mL/hr over 60 Minutes Intravenous Every 12 hours 09/28/14 2220            Objective:   Filed Vitals:   09/29/14 0044 09/29/14 0112 09/29/14 0458 09/29/14 0459  BP: 127/61 124/66 119/61   Pulse: 96 96 89 87  Temp:  98.9 F (37.2 C) 97.7 F (36.5 C)  TempSrc:  Oral Oral   Resp: 21 18 18    SpO2: 92% 91% 88% 91%    Wt Readings from Last 3 Encounters:  08/28/14 76.25 kg (168 lb 1.6 oz)  08/21/14 78.109 kg (172 lb 3.2 oz)  08/07/14 74.072 kg (163 lb 4.8 oz)     Intake/Output Summary (Last 24 hours) at 09/29/14 1021 Last data filed at 09/29/14 0955  Gross per 24 hour  Intake    245 ml  Output   1367 ml  Net  -1122 ml     Physical Exam  Awake Alert, Oriented X 3, No new F.N deficits, Normal affect [place].AT,PERRAL Supple Neck,No JVD, No cervical lymphadenopathy appriciated.  Symmetrical Chest wall movement, Good air movement bilaterally, CTAB RRR,No Gallops,Rubs or new Murmurs, No Parasternal Heave +ve B.Sounds, Abd Soft, No tenderness, No organomegaly appriciated, No rebound - guarding or rigidity. No Cyanosis, Clubbing or edema, No new Rash  or bruise      Data Review   Micro Results No results found for this or any previous visit (from the past 240 hour(s)).  Radiology Reports Dg Chest 2 View  09/28/2014   CLINICAL DATA:  Fever for 2 days, breast expander placed September 25, 2014, history of breast cancer.  EXAM: CHEST  2 VIEW  COMPARISON:  Chest radiograph April 02, 2014  FINDINGS: Cardiac silhouette is upper limits of normal in size, unchanged. Mediastinal silhouette is nonsuspicious. Patchy RIGHT perihilar and retrocardiac airspace opacities with small LEFT pleural effusion. No pneumothorax. Similar scarring LEFT upper lobe.  Single-lumen RIGHT chest Port-A-Cath with distal tip projecting in mid to distal superior vena cava. Bilateral breast expanders and surgical drains in place with surgical clips. Osseous structures are nonsuspicious. Surgical clips at the GE junction.  IMPRESSION: RIGHT perihilar, retrocardiac airspace opacities with small LEFT pleural effusion, findings are concerning for pneumonia though are nonspecific and, follow up chest radiograph is recommended after treatment to verify improvement.  Bilateral breast expander and surgical drains in place.   Electronically Signed   By: Elon Alas   On: 09/28/2014 21:35     CBC  Recent Labs Lab 09/25/14 0719 09/28/14 2024  WBC  --  6.7  HGB 10.8* 8.3*  HCT  --  24.8*  PLT  --  147*  MCV  --  100.4*  MCH  --  33.6  MCHC  --  33.5  RDW  --  13.0  LYMPHSABS  --  0.2*  MONOABS  --  0.1  EOSABS  --  0.0  BASOSABS  --  0.0    Chemistries   Recent Labs Lab 09/28/14 2024  NA 136  K 3.9  CL 104  CO2 22  GLUCOSE 131*  BUN 10  CREATININE 0.84  CALCIUM 8.7  AST 14  ALT 10  ALKPHOS 80  BILITOT 0.6   ------------------------------------------------------------------------------------------------------------------ estimated creatinine clearance is 81.7 mL/min (by C-G formula based on Cr of  0.84). ------------------------------------------------------------------------------------------------------------------ No results for input(s): HGBA1C in the last 72 hours. ------------------------------------------------------------------------------------------------------------------ No results for input(s): CHOL, HDL, LDLCALC, TRIG, CHOLHDL, LDLDIRECT in the last 72 hours. ------------------------------------------------------------------------------------------------------------------ No results for input(s): TSH, T4TOTAL, T3FREE, THYROIDAB in the last 72 hours.  Invalid input(s): FREET3 ------------------------------------------------------------------------------------------------------------------ No results for input(s): VITAMINB12, FOLATE, FERRITIN, TIBC, IRON, RETICCTPCT in the last 72 hours.  Coagulation profile No results for input(s): INR, PROTIME in the last 168 hours.  No results for input(s): DDIMER in the last 72 hours.  Cardiac Enzymes  Recent Labs Lab 09/29/14 0007  TROPONINI <0.03   ------------------------------------------------------------------------------------------------------------------ Invalid input(s): POCBNP     Time Spent in minutes   35   Hibo Blasdell K M.D on 09/29/2014 at 10:21 AM  Between 7am to 7pm - Pager - [phone]  After 7pm go to [email or web address] - password [facility]  [facility]   Office  [phone]

## 2014-09-30 ENCOUNTER — Encounter (HOSPITAL_COMMUNITY): Payer: Self-pay | Admitting: Radiology

## 2014-09-30 ENCOUNTER — Inpatient Hospital Stay (HOSPITAL_COMMUNITY): Payer: BLUE CROSS/BLUE SHIELD

## 2014-09-30 DIAGNOSIS — R0602 Shortness of breath: Secondary | ICD-10-CM

## 2014-09-30 DIAGNOSIS — C50412 Malignant neoplasm of upper-outer quadrant of left female breast: Secondary | ICD-10-CM

## 2014-09-30 DIAGNOSIS — J189 Pneumonia, unspecified organism: Secondary | ICD-10-CM

## 2014-09-30 LAB — HIV ANTIBODY (ROUTINE TESTING W REFLEX): HIV SCREEN 4TH GENERATION: NONREACTIVE

## 2014-09-30 MED ORDER — TECHNETIUM TO 99M ALBUMIN AGGREGATED
6.0000 | Freq: Once | INTRAVENOUS | Status: AC | PRN
  Administered 2014-09-30: 6 via INTRAVENOUS

## 2014-09-30 MED ORDER — IBUPROFEN 400 MG PO TABS
400.0000 mg | ORAL_TABLET | Freq: Three times a day (TID) | ORAL | Status: DC
  Administered 2014-09-30 – 2014-10-01 (×4): 400 mg via ORAL
  Filled 2014-09-30 (×7): qty 1

## 2014-09-30 MED ORDER — SODIUM CHLORIDE 0.9 % IV SOLN
INTRAVENOUS | Status: AC
  Administered 2014-09-30 – 2014-10-01 (×2): via INTRAVENOUS

## 2014-09-30 MED ORDER — TECHNETIUM TC 99M DIETHYLENETRIAME-PENTAACETIC ACID: 40.0000 | Freq: Once | INTRAVENOUS | Status: AC | PRN

## 2014-09-30 NOTE — Progress Notes (Signed)
UR Completed.  [phone]  

## 2014-09-30 NOTE — Progress Notes (Signed)
Patient Demographics  Stacy Fernandez, is a 54 y.o. female, DOB - 05-18-1961, IPJ:[id number]  Admit date - 09/28/2014   Admitting Physician Etta Quill, DO  Outpatient Primary MD for the patient is Florina Ou, MD  LOS - 2   Chief Complaint  Patient presents with  . Shortness of Breath        Subjective:   Stacy Fernandez today has, No headache, +ve chest pain, No abdominal pain - No Nausea, No new weakness tingling or numbness, No Cough - SOB.    Assessment & Plan    1.HCAP - continue empiric antibiotics follow cultures, continue supportive care with oxygen and nebulizer treatments. Per plastic surgery there was suspicion of possible PE, clinically chances of her PE low. She has IV dye allergy. Will obtain VQ scan and lower extremity venous duplex. If unremarkable will taper to oral antibiotic and discharge in the morning.   2. Ongoing chest wall pain. Recent bilateral mastectomy done by Dr. Dalbert Batman, recent breast reconstruction surgery done by plastic surgeon Dr.Thimmappa - have reconsulted Plastic Surgery, the plastic surgery and no evidence of infection for now will continue Supportive care.   3. Breast cancer. Date is post bilateral mastectomy, she is scheduled for next herceptin on Tuesday, for now hold chemotherapy until infectious issues have resolved, she follows with Dr. Jana Hakim.   4. Anemia of chronic disease. Stable no need for transfusion.    Code Status: full  Family Communication: husband  Disposition Plan: Home   Procedures     Consults  Plastic Surgery   Medications  Scheduled Meds: . buPROPion  300 mg Oral Daily  . ceFEPime (MAXIPIME) IV  1 g Intravenous 3 times per day  . gabapentin  300 mg Oral BID  . heparin  5,000 Units Subcutaneous 3 times per day  .  ibuprofen  400 mg Oral TID  . levothyroxine  75 mcg Oral QAC breakfast  . montelukast  10 mg Oral QHS  . polyethylene glycol  17 g Oral BID  . traZODone  100 mg Oral QHS  . vancomycin  1,000 mg Intravenous Q12H  . verapamil  120 mg Oral QHS   Continuous Infusions: . sodium chloride 50 mL/hr at 09/30/14 0951   PRN Meds:.albuterol, baclofen, HYDROmorphone (DILAUDID) injection, ipratropium-albuterol, LORazepam, ondansetron, oxyCODONE, senna-docusate, SUMAtriptan  DVT Prophylaxis    Heparin    Lab Results  Component Value Date   PLT 147* 09/28/2014    Antibiotics     Anti-infectives    Start     Dose/Rate Route Frequency Ordered Stop   09/29/14 0600  ceFEPIme (MAXIPIME) 1 g in dextrose 5 % 50 mL IVPB     1 g 100 mL/hr over 30 Minutes Intravenous 3 times per day 09/29/14 0000 10/07/14 0559   09/28/14 2230  ceFEPIme (MAXIPIME) 1 g in dextrose 5 % 50 mL IVPB     1 g 100 mL/hr over 30 Minutes Intravenous  Once 09/28/14 2215 09/28/14 2316   09/28/14 2230  vancomycin (VANCOCIN) IVPB 1000 mg/200 mL premix     1,000 mg 200 mL/hr over 60 Minutes Intravenous Every 12 hours 09/28/14 2220            Objective:   Filed Vitals:  09/29/14 0459 09/29/14 1334 09/29/14 2127 09/30/14 0510  BP:  111/66 105/64 113/66  Pulse: 87 88 90 89  Temp:  98.1 F (36.7 C) 98 F (36.7 C) 98.2 F (36.8 C)  TempSrc:  Oral Oral Oral  Resp:  18 18 18   SpO2: 91% 93% 98% 91%    Wt Readings from Last 3 Encounters:  08/28/14 76.25 kg (168 lb 1.6 oz)  08/21/14 78.109 kg (172 lb 3.2 oz)  08/07/14 74.072 kg (163 lb 4.8 oz)     Intake/Output Summary (Last 24 hours) at 09/30/14 1055 Last data filed at 09/30/14 1008  Gross per 24 hour  Intake   1233 ml  Output    250 ml  Net    983 ml     Physical Exam  Awake Alert, Oriented X 3, No new F.N deficits, Normal affect [place].AT,PERRAL Supple Neck,No JVD, No cervical lymphadenopathy appriciated.  Symmetrical Chest wall movement, Good air movement  bilaterally, CTAB RRR,No Gallops,Rubs or new Murmurs, No Parasternal Heave +ve B.Sounds, Abd Soft, No tenderness, No organomegaly appriciated, No rebound - guarding or rigidity. No Cyanosis, Clubbing or edema, No new Rash or bruise      Data Review   Micro Results Recent Results (from the past 240 hour(s))  Body fluid culture     Status: None (Preliminary result)   Collection Time: 09/29/14  1:19 AM  Result Value Ref Range Status   Specimen Description FLUID LEFT  Final   Special Requests JP DRAIN  Final   Gram Stain   Final    ABUNDANT WBC PRESENT, PREDOMINANTLY PMN NO SQUAMOUS EPITHELIAL CELLS SEEN NO ORGANISMS SEEN Performed at [facility]    Culture PENDING  Incomplete   Report Status PENDING  Incomplete  Body fluid culture     Status: None (Preliminary result)   Collection Time: 09/29/14  1:19 AM  Result Value Ref Range Status   Specimen Description FLUID RIGHT  Final   Special Requests JP DRAIN  Final   Gram Stain   Final    ABUNDANT WBC PRESENT, PREDOMINANTLY PMN NO SQUAMOUS EPITHELIAL CELLS SEEN NO ORGANISMS SEEN Performed at [facility]    Culture PENDING  Incomplete   Report Status PENDING  Incomplete  Culture, blood (routine x 2) Call MD if unable to obtain prior to antibiotics being given     Status: None (Preliminary result)   Collection Time: 09/29/14  1:44 AM  Result Value Ref Range Status   Specimen Description BLOOD RIGHT ARM  Final   Special Requests BOTTLES DRAWN AEROBIC AND ANAEROBIC 6CC EACH  Final   Culture   Final           BLOOD CULTURE RECEIVED NO GROWTH TO DATE CULTURE WILL BE HELD FOR 5 DAYS BEFORE ISSUING A FINAL NEGATIVE REPORT Performed at [facility]    Report Status PENDING  Incomplete  Culture, blood (routine x 2) Call MD if unable to obtain prior to antibiotics being given     Status: None (Preliminary result)   Collection Time: 09/29/14  1:52 AM  Result Value Ref Range Status   Specimen Description BLOOD  RIGHT HAND  Final   Special Requests BOTTLES DRAWN AEROBIC ONLY 5CC  Final   Culture   Final           BLOOD CULTURE RECEIVED NO GROWTH TO DATE CULTURE WILL BE HELD FOR 5 DAYS BEFORE ISSUING A FINAL NEGATIVE REPORT Performed at [facility]    Report  Status PENDING  Incomplete    Radiology Reports Dg Chest 2 View  09/28/2014   CLINICAL DATA:  Fever for 2 days, breast expander placed September 25, 2014, history of breast cancer.  EXAM: CHEST  2 VIEW  COMPARISON:  Chest radiograph April 02, 2014  FINDINGS: Cardiac silhouette is upper limits of normal in size, unchanged. Mediastinal silhouette is nonsuspicious. Patchy RIGHT perihilar and retrocardiac airspace opacities with small LEFT pleural effusion. No pneumothorax. Similar scarring LEFT upper lobe.  Single-lumen RIGHT chest Port-A-Cath with distal tip projecting in mid to distal superior vena cava. Bilateral breast expanders and surgical drains in place with surgical clips. Osseous structures are nonsuspicious. Surgical clips at the GE junction.  IMPRESSION: RIGHT perihilar, retrocardiac airspace opacities with small LEFT pleural effusion, findings are concerning for pneumonia though are nonspecific and, follow up chest radiograph is recommended after treatment to verify improvement.  Bilateral breast expander and surgical drains in place.   Electronically Signed   By: Elon Alas   On: 09/28/2014 21:35     CBC  Recent Labs Lab 09/25/14 0719 09/28/14 2024  WBC  --  6.7  HGB 10.8* 8.3*  HCT  --  24.8*  PLT  --  147*  MCV  --  100.4*  MCH  --  33.6  MCHC  --  33.5  RDW  --  13.0  LYMPHSABS  --  0.2*  MONOABS  --  0.1  EOSABS  --  0.0  BASOSABS  --  0.0    Chemistries   Recent Labs Lab 09/28/14 2024  NA 136  K 3.9  CL 104  CO2 22  GLUCOSE 131*  BUN 10  CREATININE 0.84  CALCIUM 8.7  AST 14  ALT 10  ALKPHOS 80  BILITOT 0.6    ------------------------------------------------------------------------------------------------------------------ estimated creatinine clearance is 81.7 mL/min (by C-G formula based on Cr of 0.84). ------------------------------------------------------------------------------------------------------------------ No results for input(s): HGBA1C in the last 72 hours. ------------------------------------------------------------------------------------------------------------------ No results for input(s): CHOL, HDL, LDLCALC, TRIG, CHOLHDL, LDLDIRECT in the last 72 hours. ------------------------------------------------------------------------------------------------------------------ No results for input(s): TSH, T4TOTAL, T3FREE, THYROIDAB in the last 72 hours.  Invalid input(s): FREET3 ------------------------------------------------------------------------------------------------------------------ No results for input(s): VITAMINB12, FOLATE, FERRITIN, TIBC, IRON, RETICCTPCT in the last 72 hours.  Coagulation profile No results for input(s): INR, PROTIME in the last 168 hours.  No results for input(s): DDIMER in the last 72 hours.  Cardiac Enzymes  Recent Labs Lab 09/29/14 0007  TROPONINI <0.03   ------------------------------------------------------------------------------------------------------------------ Invalid input(s): POCBNP     Time Spent in minutes   35   Diana Armijo K M.D on 09/30/2014 at 10:55 AM  Between 7am to 7pm - Pager - [phone]  After 7pm go to [email or web address] - password [facility]  [facility]   Office  [phone]

## 2014-09-30 NOTE — Progress Notes (Signed)
POD# 5 delayed breast reconstruction with tissue expanders, acellular dermis  Afebrile. Had good day yesterday, pain improved with new regimen Able to walk without SOB, 91% RA Woke up feeling "achey" and mild SOB  Temp:  [98 F (36.7 C)-98.2 F (36.8 C)] 98.2 F (36.8 C) (03/13 0510) Pulse Rate:  [88-90] 89 (03/13 0510) Resp:  [18] 18 (03/13 0510) BP: (105-113)/(64-66) 113/66 mmHg (03/13 0510) SpO2:  [91 %-98 %] 91 % (03/13 0510)   BM X 1, JPs listed under "intake" as 15/18 PO 1200  PE On way to get DVT study  A/P Will await [place] results.  Pain controlled improved- when stable for d/c rec Motrin with oxycodone, senna/docusate daily Defer to IM for antibiotics, on Vanc/cefipime for suspected PNA  [name], MD [facility] [phone]

## 2014-09-30 NOTE — Progress Notes (Signed)
VASCULAR LAB PRELIMINARY  PRELIMINARY  PRELIMINARY  PRELIMINARY  Bilateral lower extremity venous duplex completed.    Preliminary report:  Bilateral:  No evidence of DVT, superficial thrombosis, or Baker's Cyst.   Janan Bogie, RVS 09/30/2014, 4:30 PM

## 2014-10-01 ENCOUNTER — Telehealth: Payer: Self-pay | Admitting: *Deleted

## 2014-10-01 ENCOUNTER — Other Ambulatory Visit: Payer: Self-pay | Admitting: Oncology

## 2014-10-01 ENCOUNTER — Telehealth: Payer: Self-pay | Admitting: Oncology

## 2014-10-01 LAB — LEGIONELLA ANTIGEN, URINE

## 2014-10-01 LAB — BASIC METABOLIC PANEL
Anion gap: 8 (ref 5–15)
BUN: 7 mg/dL (ref 6–23)
CALCIUM: 8.7 mg/dL (ref 8.4–10.5)
CO2: 25 mmol/L (ref 19–32)
CREATININE: 0.84 mg/dL (ref 0.50–1.10)
Chloride: 108 mmol/L (ref 96–112)
GFR calc Af Amer: >90 mL/min (ref >90)
GFR calc non Af Amer: 78 mL/min — ABNORMAL LOW (ref >90)
GLUCOSE: 81 mg/dL (ref 70–99)
Potassium: 3.1 mmol/L — ABNORMAL LOW (ref 3.5–5.1)
SODIUM: 141 mmol/L (ref 135–145)

## 2014-10-01 LAB — PATHOLOGIST SMEAR REVIEW

## 2014-10-01 MED ORDER — SENNOSIDES-DOCUSATE SODIUM 8.6-50 MG PO TABS: 1.0000 | ORAL_TABLET | Freq: Every evening | ORAL | Status: DC | PRN

## 2014-10-01 MED ORDER — IBUPROFEN 400 MG PO TABS: 400.0000 mg | ORAL_TABLET | Freq: Three times a day (TID) | ORAL | Status: DC

## 2014-10-01 MED ORDER — OXYCODONE HCL 5 MG PO TABS: 5.0000 mg | ORAL_TABLET | ORAL | Status: DC | PRN

## 2014-10-01 MED ORDER — POTASSIUM CHLORIDE CRYS ER 20 MEQ PO TBCR
40.0000 meq | EXTENDED_RELEASE_TABLET | ORAL | Status: DC
  Administered 2014-10-01: 40 meq via ORAL
  Filled 2014-10-01: qty 2

## 2014-10-01 MED ORDER — DOXYCYCLINE HYCLATE 100 MG PO CAPS: 100.0000 mg | ORAL_CAPSULE | Freq: Two times a day (BID) | ORAL | Status: DC

## 2014-10-01 NOTE — Discharge Summary (Signed)
Stacy Fernandez, is a 54 y.o. female  DOB 03-26-1961  MRN [id number].  Admission date:  09/28/2014  Admitting Physician  Etta Quill, DO  Discharge Date:  10/01/2014   Primary MD  Florina Ou, MD  Recommendations for primary care physician for things to follow:   Repeat CBC, BMP and a 2 view chest x-ray in one week.   Admission Diagnosis  Hospital-acquired pneumonia [J18.9] Fever [R50.9]   Discharge Diagnosis  Hospital-acquired pneumonia [J18.9] Fever [R50.9]    Principal Problem:   Hospital-acquired pneumonia Active Problems:   Chest pain   Breast cancer of upper-outer quadrant of left female breast      Past Medical History  Diagnosis Date  . Hypothyroidism   . Shortness of breath     2D ECHO, 04/20/2012 - EF >55%, normal; EXERCISE STRESS TEST, 04/20/2012 - normal, EKG negative for ischemia, no ECG changes  . COPD (chronic obstructive pulmonary disease)   . Emphysema   . Hematuria     cause unknow- saw Urologist  . Hot flashes   . Anginal pain     Dr Claiborne Billings cardiologist- test normal.  " think it is from breast cancer"  . Brain aneurysm      Dr Jannifer Franklin neurologist is monitoring  . Anxiety     Due to situation, CA breast  . Chronic bronchitis     "I've had it quite a few times" (04/02/2014)  . GERD (gastroesophageal reflux disease)   . Migraine     "treated w/botox for them" (04/02/2014)  . Wears glasses   . Wears dentures     top    Past Surgical History  Procedure Laterality Date  . Cholecystectomy    . Appendectomy    . Laparoscopic nissen fundoplication  07/1019    for GERD- not a problem  . Video bronchoscopy Bilateral 01/16/2013    Procedure: VIDEO BRONCHOSCOPY WITHOUT FLUORO;  Surgeon: [name], MD;  Location: [facility];  Service: Cardiopulmonary;  Laterality: Bilateral;    . Mastectomy complete / simple w/ sentinel node biopsy Left 04/02/2014    axillary SLN  . Mastectomy complete / simple Right 04/02/2014    PROPHYLACTIC  . Portacath placement Right 04/02/2014  . Breast biopsy Left 11/2013 X 3  . Total abdominal hysterectomy    . Cardiac catheterization  2014  . Simple mastectomy with axillary sentinel node biopsy Bilateral 04/02/2014    Procedure: LEFT TOTAL MASTECTOMY WITH LEFT AXILLARY SENTINEL NODE BIOPSY, RIGHT PROPHYLACTIC MASTETCTOMY;  Surgeon: [name], MD;  Location: [place];  Service: General;  Laterality: Bilateral;  . Portacath placement N/A 04/02/2014    Procedure: INSERTION PORT-A-CATH;  Surgeon: [name], MD;  Location: [place];  Service: General;  Laterality: N/A;  . Left heart catheterization with coronary angiogram N/A 12/26/2012    Procedure: LEFT HEART CATHETERIZATION WITH CORONARY ANGIOGRAM;  Surgeon: Lorretta Harp, MD;  Location: [facility] CATH LAB;  Service: Cardiovascular;  Laterality: N/A;  . Breast reconstruction with placement of tissue expander and flex  hd (acellular hydrated dermis) Bilateral 09/25/2014    Procedure: PLACEMENT OF BILATERAL TISSUE EXPANDER AND  ACELLULAR DERMIS FOR BREAST RECONSTRUCTION ;  Surgeon: [name], MD;  Location: [place];  Service: Plastics;  Laterality: Bilateral;       History of present illness and  Hospital Course:     Kindly see H&P for history of present illness and admission details, please review complete Labs, Consult reports and Test reports for all details in brief  HPI  from the history and physical done on the day of admission  Stacy Fernandez is a 54 y.o. female s/p placement of tissue expanders for reconstructive surgery of the breasts on 09/25/14, still undergoing chemotherapy with herceptin for Stage 1A BRCA next herceptin dose scheduled for this coming Tuesday. Patient presents to the ED with SOB. Symptoms onset Wed evening after discharge. Have been  worsening. There is also worsening chest pain which she associates with the surgical sites that has been worse today. She has been putting out between 30-40 ml of bright red fluid in each of the 2 JP drain since surgery.  Hospital Course    1.HCAP low probability - spondylo-very well to empiric IV antibiotics, currently no oxygen need, no cough or shortness of breath, we'll transition to 7 days of oral doxycycline. Per plastic surgery there was suspicion of possible PE, clinically chances of her PE were low low. She had IV dye allergy. We obtained V/Q scan and lower extremity venous duplex which were both nonacute and unremarkable. Patient is completely symptom free and eager to go home will be discharged.   2. Ongoing chest wall pain. Recent bilateral mastectomy done by Dr. Dalbert Batman, recent breast reconstruction surgery done by plastic surgeon Dr.Thimmappa - have reconsulted Plastic Surgery, the plastic surgery and no evidence of infection for now will continue Supportive care narcotics and NSAIDs prescribed by plastic surgery of pressure there help, will follow with them in 1 week post discharge.   3. Breast cancer. Date is post bilateral mastectomy, she is scheduled for next herceptin on Tuesday, for now hold chemotherapy until infectious issues have resolved, she follows with Dr. Jana Hakim.   4. Anemia of chronic disease. Stable no need for transfusion.   Discharge Condition: Stable   Follow UP  Follow-up Information    Follow up with [name], MD On 10/08/2014.   Specialty:  Plastic Surgery   Why:  10 00 am   Contact information:   [address] [place] [address] [phone]       Follow up with Florina Ou, MD. Schedule an appointment as soon as possible for a visit in 1 week.   Specialty:  Family Medicine   Contact information:   [address] [place]  [address] [phone]       Follow up with  [name], MD. Schedule an appointment as soon as possible for a visit in 1 week.   Specialty:  Oncology   Why:  breast CA   Contact information:   [place] [address] [phone]         Discharge Instructions  and  Discharge Medications      Discharge Instructions    Diet - low sodium heart healthy    Complete by:  As directed      Discharge instructions    Complete by:  As directed   JPs to bulb suction, strip and record twice daily.  Ok to shower, pat incisions dry.  Breast binder all times   Follow with Primary MD Florina Ou, MD in 7 days   Get CBC, CMP, 2 view Chest X ray checked  by Primary MD next visit.    Activity: As tolerated with Full fall precautions use walker/cane & assistance as needed   Disposition Home    Diet: Heart Healthy    For Heart failure patients - Check your Weight same time everyday, if you gain over 2 pounds, or you develop in leg swelling, experience more shortness of breath or chest pain, call your Primary MD immediately. Follow Cardiac Low Salt Diet and 1.5 lit/day fluid restriction.   On your next visit with your primary care physician please Get Medicines reviewed and adjusted.   Please request your Prim.MD to go over all Hospital Tests and Procedure/Radiological results at the follow up, please get all Hospital records sent to your Prim MD by signing hospital release before you go home.   If you experience worsening of your admission symptoms, develop shortness of breath, life threatening emergency, suicidal or homicidal thoughts you must seek medical attention immediately by calling 911 or calling your MD immediately  if symptoms less severe.  You Must read complete instructions/literature along with all the possible adverse reactions/side effects for all the Medicines you take and that have been prescribed to you. Take any new Medicines after you have completely understood and accpet all the  possible adverse reactions/side effects.   Do not drive, operating heavy machinery, perform activities at heights, swimming or participation in water activities or provide baby sitting services if your were admitted for syncope or siezures until you have seen by Primary MD or a Neurologist and advised to do so again.  Do not drive when taking Pain medications.    Do not take more than prescribed Pain, Sleep and Anxiety Medications  Special Instructions: If you have smoked or chewed Tobacco  in the last 2 yrs please stop smoking, stop any regular Alcohol  and or any Recreational drug use.  Wear Seat belts while driving.   Please note  You were cared for by a hospitalist during your hospital stay. If you have any questions about your discharge medications or the care you received while you were in the hospital after you are discharged, you can call the unit and asked to speak with the hospitalist on call if the hospitalist that took care of you is not available. Once you are discharged, your primary care physician will handle any further medical issues. Please note that NO REFILLS for any discharge medications will be authorized once you are discharged, as it is imperative that you return to your primary care physician (or establish a relationship with a primary care physician if you do not have one) for your aftercare needs so that they can reassess your need for medications and monitor your lab values.     Increase activity slowly    Complete by:  As directed             Medication List    STOP taking these medications        HYDROcodone-acetaminophen 10-325 MG per tablet  Commonly known as:  NORCO     sulfamethoxazole-trimethoprim 800-160 MG per tablet  Commonly known as:  BACTRIM DS,SEPTRA DS     tiZANidine 2 MG tablet  Commonly known as:  ZANAFLEX      TAKE these medications        buPROPion  300 MG 24 hr tablet  Commonly known as:  WELLBUTRIN XL  Take 300 mg by mouth  daily.     doxycycline 100 MG capsule  Commonly known as:  VIBRAMYCIN  Take 1 capsule (100 mg total) by mouth 2 (two) times daily.     DUONEB 0.5-2.5 (3) MG/3ML Soln  Generic drug:  ipratropium-albuterol  Inhale 3 mLs into the lungs every 6 (six) hours as needed (for shortness of breath).     gabapentin 300 MG capsule  Commonly known as:  NEURONTIN  Take 1 capsule (300 mg total) by mouth 2 (two) times daily.     HERCEPTIN IV  Inject 462 mg into the vein every 21 ( twenty-one) days.     ibuprofen 400 MG tablet  Commonly known as:  ADVIL,MOTRIN  Take 1 tablet (400 mg total) by mouth 3 (three) times daily.     levothyroxine 75 MCG tablet  Commonly known as:  SYNTHROID, LEVOTHROID  Take 75 mcg by mouth daily before breakfast.     lidocaine-prilocaine cream  Commonly known as:  EMLA  Apply 1 application topically as needed (for chemo injections).     LORazepam 0.5 MG tablet  Commonly known as:  ATIVAN  Take 1 tablet (0.5 mg total) by mouth at bedtime as needed (Nausea or vomiting).     montelukast 10 MG tablet  Commonly known as:  SINGULAIR  Take 10 mg by mouth at bedtime.     ondansetron 8 MG tablet  Commonly known as:  ZOFRAN  Take 8 mg by mouth as needed. For N/V     oxyCODONE 5 MG immediate release tablet  Commonly known as:  Oxy IR/ROXICODONE  Take 1-2 tablets (5-10 mg total) by mouth every 4 (four) hours as needed for moderate pain.     PROAIR HFA 108 (90 BASE) MCG/ACT inhaler  Generic drug:  albuterol  Inhale 2 puffs into the lungs every 4 (four) hours as needed for wheezing or shortness of breath.     prochlorperazine 10 MG tablet  Commonly known as:  COMPAZINE  Take 10 mg by mouth as needed for nausea or vomiting. Takes after chemo injections     rizatriptan 10 MG disintegrating tablet  Commonly known as:  MAXALT-MLT  Take 1 tablet (10 mg total) by mouth as needed for migraine. May repeat in 2 hours if needed     senna-docusate 8.6-50 MG per tablet   Commonly known as:  Senokot-S  Take 1 tablet by mouth at bedtime as needed for mild constipation.     SUMAtriptan 6 MG/0.5ML Soln injection  Commonly known as:  IMITREX  Inject 6 mg into the skin every 2 (two) hours as needed for migraine or headache. May repeat in 2 hours if headache persists or recurs. Max 2 doses in 24 hours     traZODone 100 MG tablet  Commonly known as:  DESYREL  Take 1 tablet (100 mg total) by mouth at bedtime.     verapamil 120 MG CR tablet  Commonly known as:  CALAN-SR  TAKE 1 TABLET BY MOUTH AT BEDTIME ( MAX 30 DAY SUPPLY PER INSURANCE)          Diet and Activity recommendation: See Discharge Instructions above   Consults obtained - Lasix surgery   Major procedures and Radiology Reports - PLEASE review detailed and final reports for all details, in brief -     VASCULAR LAB - PRELIMINARY   Bilateral lower extremity venous duplex completed.  Preliminary report: Bilateral: No evidence of DVT, superficial thrombosis, or Baker's Cyst.   Dg Chest 2 View  09/28/2014   CLINICAL DATA:  Fever for 2 days, breast expander placed September 25, 2014, history of breast cancer.  EXAM: CHEST  2 VIEW  COMPARISON:  Chest radiograph April 02, 2014  FINDINGS: Cardiac silhouette is upper limits of normal in size, unchanged. Mediastinal silhouette is nonsuspicious. Patchy RIGHT perihilar and retrocardiac airspace opacities with small LEFT pleural effusion. No pneumothorax. Similar scarring LEFT upper lobe.  Single-lumen RIGHT chest Port-A-Cath with distal tip projecting in mid to distal superior vena cava. Bilateral breast expanders and surgical drains in place with surgical clips. Osseous structures are nonsuspicious. Surgical clips at the GE junction.  IMPRESSION: RIGHT perihilar, retrocardiac airspace opacities with small LEFT pleural effusion, findings are concerning for pneumonia though are nonspecific and, follow up chest radiograph is recommended after treatment  to verify improvement.  Bilateral breast expander and surgical drains in place.   Electronically Signed   By: Elon Alas   On: 09/28/2014 21:35   Nm Pulmonary Perf And Vent  09/30/2014   CLINICAL DATA:  Shortness of breath  EXAM: NUCLEAR MEDICINE VENTILATION - PERFUSION LUNG SCAN  TECHNIQUE: Ventilation images were obtained in multiple projections using inhaled aerosol technetium 99 M DTPA. Perfusion images were obtained in multiple projections after intravenous injection of Tc-24mMAA.  RADIOPHARMACEUTICALS:  40.0 mCi Tc-937mTPA aerosol and 6.0 mCi Tc-9931mA  COMPARISON:  Chest radiograph from 09/28/2014  FINDINGS: Ventilation: There is the large right upper lobe segmental ventilation abnormality.  Perfusion: Large right upper lobe defect corresponds with the ventilation abnormality. There are photopenic defects corresponding to the patient's chest tissue expanders and porta catheter. No on matched segmental perfusion defects identified.  IMPRESSION: 1. Single large matched defect within the right upper lobe. Findings compatible with low probability for pulmonary embolus.   Electronically Signed   By: TayKerby MoorsD.   On: 09/30/2014 13:08    Micro Results      Recent Results (from the past 240 hour(s))  Body fluid culture     Status: None (Preliminary result)   Collection Time: 09/29/14  1:19 AM  Result Value Ref Range Status   Specimen Description FLUID LEFT  Final   Special Requests JP DRAIN  Final   Gram Stain   Final    ABUNDANT WBC PRESENT, PREDOMINANTLY PMN NO SQUAMOUS EPITHELIAL CELLS SEEN NO ORGANISMS SEEN Performed at [facility] Culture   Final    NO GROWTH 1 DAY Performed at [facility] Report Status PENDING  Incomplete  Body fluid culture     Status: None (Preliminary result)   Collection Time: 09/29/14  1:19 AM  Result Value Ref Range Status   Specimen Description FLUID RIGHT  Final   Special Requests JP DRAIN  Final   Gram Stain    Final    ABUNDANT WBC PRESENT, PREDOMINANTLY PMN NO SQUAMOUS EPITHELIAL CELLS SEEN NO ORGANISMS SEEN Performed at [facility] Culture   Final    NO GROWTH 1 DAY Performed at [facility] Report Status PENDING  Incomplete  Culture, blood (routine x 2) Call MD if unable to obtain prior to antibiotics being given     Status: None (Preliminary result)   Collection Time: 09/29/14  1:44 AM  Result Value Ref Range Status   Specimen Description BLOOD RIGHT ARM  Final  Special Requests BOTTLES DRAWN AEROBIC AND ANAEROBIC [facility] EACH  Final   Culture   Final           BLOOD CULTURE RECEIVED NO GROWTH TO DATE CULTURE WILL BE HELD FOR 5 DAYS BEFORE ISSUING A FINAL NEGATIVE REPORT Performed at [facility]    Report Status PENDING  Incomplete  Culture, blood (routine x 2) Call MD if unable to obtain prior to antibiotics being given     Status: None (Preliminary result)   Collection Time: 09/29/14  1:52 AM  Result Value Ref Range Status   Specimen Description BLOOD RIGHT HAND  Final   Special Requests BOTTLES DRAWN AEROBIC ONLY 5CC  Final   Culture   Final           BLOOD CULTURE RECEIVED NO GROWTH TO DATE CULTURE WILL BE HELD FOR 5 DAYS BEFORE ISSUING A FINAL NEGATIVE REPORT Performed at [facility]    Report Status PENDING  Incomplete       Today   Subjective:   Hanadi Stanly today has no headache,no chest abdominal pain,no new weakness tingling or numbness, feels much better wants to go home today.    Objective:   Blood pressure 150/84, pulse 74, temperature 98.1 F (36.7 C), temperature source Oral, resp. rate 18, SpO2 98 %.   Intake/Output Summary (Last 24 hours) at 10/01/14 1000 Last data filed at 10/01/14 0832  Gross per 24 hour  Intake 1245.83 ml  Output   1380 ml  Net -134.17 ml    Exam Awake Alert, Oriented x 3, No new F.N deficits, Normal affect [place].AT,PERRAL Supple Neck,No JVD, No cervical lymphadenopathy appriciated.   Symmetrical Chest wall movement, Good air movement bilaterally, CTAB RRR,No Gallops,Rubs or new Murmurs, No Parasternal Heave +ve B.Sounds, Abd Soft, Non tender, No organomegaly appriciated, No rebound -guarding or rigidity. No Cyanosis, Clubbing or edema, No new Rash or bruise  Data Review   CBC w Diff: Lab Results  Component Value Date   WBC 6.7 09/28/2014   WBC 3.0* 09/11/2014   HGB 8.3* 09/28/2014   HGB 9.5* 09/11/2014   HCT 24.8* 09/28/2014   HCT 28.7* 09/11/2014   PLT 147* 09/28/2014   PLT 100* 09/11/2014   LYMPHOPCT 3* 09/28/2014   LYMPHOPCT 28.2 09/11/2014   MONOPCT 2* 09/28/2014   MONOPCT 17.4* 09/11/2014   EOSPCT 0 09/28/2014   EOSPCT 0.7 09/11/2014   BASOPCT 0 09/28/2014   BASOPCT 1.3 09/11/2014    CMP: Lab Results  Component Value Date   NA 136 09/28/2014   NA 142 08/28/2014   K 3.9 09/28/2014   K 4.1 08/28/2014   CL 104 09/28/2014   CO2 22 09/28/2014   CO2 23 08/28/2014   BUN 10 09/28/2014   BUN 11.6 08/28/2014   CREATININE 0.84 09/28/2014   CREATININE 1.0 08/28/2014   PROT 6.1 09/28/2014   PROT 6.3* 08/28/2014   ALBUMIN 3.0* 09/28/2014   ALBUMIN 3.8 08/28/2014   BILITOT 0.6 09/28/2014   BILITOT 0.34 08/28/2014   ALKPHOS 80 09/28/2014   ALKPHOS 128 08/28/2014   AST 14 09/28/2014   AST 11 08/28/2014   ALT 10 09/28/2014   ALT 10 08/28/2014  .   Total Time in preparing paper work, data evaluation and todays exam - 35 minutes  Thurnell Lose M.D on 10/01/2014 at 10:00 AM  [facility]   Office  [phone]

## 2014-10-01 NOTE — Discharge Instructions (Signed)
JPs to bulb suction, strip and record twice daily.  Ok to shower, pat incisions dry.  Breast binder all times   Follow with Primary MD Stacy Ou, MD in 7 days   Get CBC, CMP, 2 view Chest X ray checked  by Primary MD next visit.    Activity: As tolerated with Full fall precautions use walker/cane & assistance as needed   Disposition Home    Diet: Heart Healthy    For Heart failure patients - Check your Weight same time everyday, if you gain over 2 pounds, or you develop in leg swelling, experience more shortness of breath or chest pain, call your Primary MD immediately. Follow Cardiac Low Salt Diet and 1.5 lit/day fluid restriction.   On your next visit with your primary care physician please Get Medicines reviewed and adjusted.   Please request your Prim.MD to go over all Hospital Tests and Procedure/Radiological results at the follow up, please get all Hospital records sent to your Prim MD by signing hospital release before you go home.   If you experience worsening of your admission symptoms, develop shortness of breath, life threatening emergency, suicidal or homicidal thoughts you must seek medical attention immediately by calling 911 or calling your MD immediately  if symptoms less severe.  You Must read complete instructions/literature along with all the possible adverse reactions/side effects for all the Medicines you take and that have been prescribed to you. Take any new Medicines after you have completely understood and accpet all the possible adverse reactions/side effects.   Do not drive, operating heavy machinery, perform activities at heights, swimming or participation in water activities or provide baby sitting services if your were admitted for syncope or siezures until you have seen by Primary MD or a Neurologist and advised to do so again.  Do not drive when taking Pain medications.    Do not take more than prescribed Pain, Sleep and Anxiety  Medications  Special Instructions: If you have smoked or chewed Tobacco  in the last 2 yrs please stop smoking, stop any regular Alcohol  and or any Recreational drug use.  Wear Seat belts while driving.   Please note  You were cared for by a hospitalist during your hospital stay. If you have any questions about your discharge medications or the care you received while you were in the hospital after you are discharged, you can call the unit and asked to speak with the hospitalist on call if the hospitalist that took care of you is not available. Once you are discharged, your primary care physician will handle any further medical issues. Please note that NO REFILLS for any discharge medications will be authorized once you are discharged, as it is imperative that you return to your primary care physician (or establish a relationship with a primary care physician if you do not have one) for your aftercare needs so that they can reassess your need for medications and monitor your lab values.

## 2014-10-01 NOTE — Progress Notes (Signed)
POD# 6 delayed breast reconstruction with tissue expanders, acellular dermis  V/Q and DVT US negative Not requiring IV pain medication, tolerating ibuprofen/oxycodone Has only taken one dose Baclofen on 3/12  Temp:  [98.1 F (36.7 C)-98.5 F (36.9 C)] 98.1 F (36.7 C) (03/14 0550) Pulse Rate:  [74-79] 74 (03/14 0550) Resp:  [18] 18 (03/14 0550) BP: (108-150)/(63-84) 150/84 mmHg (03/14 0550) SpO2:  [98 %-99 %] 98 % (03/14 0550)   JP R 30 L 30  PE Chest soft, incisions dry JP serosanguinous  A/P Cultures negative to date. Anticipate d/c home today. Defer to IM for antibiotics, on Vanc/cefipime for suspected PNA Will reschedule next follow up with me to next week. Pt will call Oncology to reschedule Herceptin (scheduled for tomorrowO Pain controlled improved- Rec Motrin with oxycodone, and I have written Rx for these   [name], MD [facility] Plastic & Reconstructive Surgery [phone]

## 2014-10-01 NOTE — Telephone Encounter (Signed)
Patient called to say she was discharged from the hospital today.  She does not feel up to receiving treatment tomorrow, but will come to see Heather.

## 2014-10-01 NOTE — Telephone Encounter (Signed)
per Webb Silversmith fron [address] unit tcld to sch f/u appt for pt d/c from hospital-adv pt has appt 3/15 @ 11:15-Anne understood

## 2014-10-02 ENCOUNTER — Telehealth: Payer: Self-pay | Admitting: Nurse Practitioner

## 2014-10-02 ENCOUNTER — Other Ambulatory Visit (HOSPITAL_BASED_OUTPATIENT_CLINIC_OR_DEPARTMENT_OTHER): Payer: BLUE CROSS/BLUE SHIELD

## 2014-10-02 ENCOUNTER — Encounter: Payer: Self-pay | Admitting: Nurse Practitioner

## 2014-10-02 ENCOUNTER — Ambulatory Visit (HOSPITAL_BASED_OUTPATIENT_CLINIC_OR_DEPARTMENT_OTHER): Payer: BLUE CROSS/BLUE SHIELD | Admitting: Nurse Practitioner

## 2014-10-02 ENCOUNTER — Ambulatory Visit: Payer: BLUE CROSS/BLUE SHIELD

## 2014-10-02 VITALS — BP 117/76 | HR 76 | Temp 98.0°F | Resp 18 | Ht 66.0 in | Wt 171.4 lb

## 2014-10-02 DIAGNOSIS — C50412 Malignant neoplasm of upper-outer quadrant of left female breast: Secondary | ICD-10-CM

## 2014-10-02 DIAGNOSIS — R609 Edema, unspecified: Secondary | ICD-10-CM

## 2014-10-02 DIAGNOSIS — C50812 Malignant neoplasm of overlapping sites of left female breast: Secondary | ICD-10-CM

## 2014-10-02 DIAGNOSIS — E876 Hypokalemia: Secondary | ICD-10-CM | POA: Insufficient documentation

## 2014-10-02 DIAGNOSIS — M25473 Effusion, unspecified ankle: Secondary | ICD-10-CM

## 2014-10-02 DIAGNOSIS — G620 Drug-induced polyneuropathy: Secondary | ICD-10-CM

## 2014-10-02 DIAGNOSIS — C50419 Malignant neoplasm of upper-outer quadrant of unspecified female breast: Secondary | ICD-10-CM

## 2014-10-02 DIAGNOSIS — Z171 Estrogen receptor negative status [ER-]: Secondary | ICD-10-CM

## 2014-10-02 DIAGNOSIS — J189 Pneumonia, unspecified organism: Secondary | ICD-10-CM

## 2014-10-02 LAB — CBC WITH DIFFERENTIAL/PLATELET
BASO%: 0.3 % (ref 0.0–2.0)
Basophils Absolute: 0 10*3/uL (ref 0.0–0.1)
EOS ABS: 0.1 10*3/uL (ref 0.0–0.5)
EOS%: 2.3 % (ref 0.0–7.0)
HCT: 26.7 % — ABNORMAL LOW (ref 34.8–46.6)
HGB: 8.7 g/dL — ABNORMAL LOW (ref 11.6–15.9)
LYMPH%: 18.8 % (ref 14.0–49.7)
MCH: 32.8 pg (ref 25.1–34.0)
MCHC: 32.6 g/dL (ref 31.5–36.0)
MCV: 100.8 fL (ref 79.5–101.0)
MONO#: 0.3 10*3/uL (ref 0.1–0.9)
MONO%: 7.5 % (ref 0.0–14.0)
NEUT%: 71.1 % (ref 38.4–76.8)
NEUTROS ABS: 2.8 10*3/uL (ref 1.5–6.5)
PLATELETS: 175 10*3/uL (ref 145–400)
RBC: 2.65 10*6/uL — ABNORMAL LOW (ref 3.70–5.45)
RDW: 12.9 % (ref 11.2–14.5)
WBC: 3.9 10*3/uL (ref 3.9–10.3)
lymph#: 0.7 10*3/uL — ABNORMAL LOW (ref 0.9–3.3)

## 2014-10-02 LAB — COMPREHENSIVE METABOLIC PANEL (CC13)
ALBUMIN: 3 g/dL — AB (ref 3.5–5.0)
ALT: 13 U/L (ref 0–55)
AST: 11 U/L (ref 5–34)
Alkaline Phosphatase: 83 U/L (ref 40–150)
Anion Gap: 9 mEq/L (ref 3–11)
BILIRUBIN TOTAL: 0.49 mg/dL (ref 0.20–1.20)
BUN: 9.9 mg/dL (ref 7.0–26.0)
CO2: 25 meq/L (ref 22–29)
Calcium: 9 mg/dL (ref 8.4–10.4)
Chloride: 110 mEq/L — ABNORMAL HIGH (ref 98–109)
Creatinine: 0.9 mg/dL (ref 0.6–1.1)
EGFR: 70 mL/min/{1.73_m2} — ABNORMAL LOW (ref >90)
Glucose: 96 mg/dl (ref 70–140)
Potassium: 3.8 mEq/L (ref 3.5–5.1)
SODIUM: 144 meq/L (ref 136–145)
TOTAL PROTEIN: 5.9 g/dL — AB (ref 6.4–8.3)

## 2014-10-02 LAB — BODY FLUID CULTURE
CULTURE: NO GROWTH
Culture: NO GROWTH

## 2014-10-02 MED ORDER — FUROSEMIDE 20 MG PO TABS: 10.0000 mg | ORAL_TABLET | Freq: Every day | ORAL | Status: DC

## 2014-10-02 MED ORDER — POTASSIUM CHLORIDE CRYS ER 20 MEQ PO TBCR: 20.0000 meq | EXTENDED_RELEASE_TABLET | Freq: Once | ORAL | Status: DC

## 2014-10-02 NOTE — Telephone Encounter (Signed)
appts made and avs  And sent echo to precert  anne

## 2014-10-02 NOTE — Progress Notes (Signed)
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 She brought it to her primary physician's attention him a and on 02/21/2014 she underwent bilateral diagnostic mammography with tomography at [facility]. The breast composition was category B. In the left breast at the 3:00 position there was an irregular mass associated with skin retraction. Ultrasound performed on the same day confirmed a 1.1 cm irregular solid mass in the left breast at the 3:00 position. There were no abnormalities by sonography in the left axilla or in the superior portion of the breast, which is for the patient experiences some tenderness.  Biopsy of the left breast area in question 02/22/2014 showed ([id number]) and invasive ductal carcinoma, grade 3, estrogen and progesterone receptor negative, with an MIB-1 of 83%, and HER-2 amplified, the signals ratio being 2.07, and a copy number per cell 4.75.  On 02/28/2014 the patient underwent bilateral breast MRI, which showed in the posterior third of the left breast at the 3:00 position an irregular spiculated mass measuring 1.8 cm. The left breast was unremarkable and there were no abnormal appearing lymph nodes.  The patient's subsequent history is as  detailed below  INTERVAL HISTORY: Stacy Fernandez returns today for follow up of her breast cancer, accompanied by her husband Stacy Fernandez. Since her last visit she has had bilateral expanders placed. Shortly after being discharged from the surgery she was readmitted with a case of pneumonia. She was released yesterday with a prescription of doxycycline. Stacy Fernandez is still feeling short of breath and fatigued from this whole ordeal, and she would like to skip trastuzumab that was scheduled for today.  REVIEW OF SYSTEMS:  Stacy Fernandez denies fevers, chills, nausea, or vomiting. She has no constipation from the oxycodone she is taking for the pain associated with the expander placement. Her appetite is fair. She is sleeping better at night with the trazodone. She continues to have some neuropathy symptoms to the bottom of her bilateral feet. Her hands and feet are fairly swollen. She can barely get her rings off and she cant wear any other shoes but sandals. She endorse anxiety but no depression. A detailed review of system is otherwise stable.  PAST MEDICAL HISTORY: Past Medical History  Diagnosis Date  . Hypothyroidism   . Shortness of breath     2D ECHO, 04/20/2012 - EF >55%, normal; EXERCISE STRESS TEST, 04/20/2012 - normal, EKG negative for ischemia, no ECG changes  . COPD (chronic obstructive pulmonary disease)   . Emphysema   . Hematuria     cause unknow- saw Urologist  . Hot flashes   . Anginal pain     Dr Claiborne Billings cardiologist- test normal.  " think it is from breast cancer"  . Brain aneurysm      Dr Jannifer Franklin neurologist is monitoring  . Anxiety     Due to situation, CA breast  .  Chronic bronchitis     "I've had it quite a few times" (04/02/2014)  . GERD (gastroesophageal reflux disease)   . Migraine     "treated w/botox for them" (04/02/2014)  . Wears glasses   . Wears dentures     top    PAST SURGICAL HISTORY: Past Surgical History  Procedure Laterality Date  . Cholecystectomy    . Appendectomy    .  Laparoscopic nissen fundoplication  09/8248    for GERD- not a problem  . Video bronchoscopy Bilateral 01/16/2013    Procedure: VIDEO BRONCHOSCOPY WITHOUT FLUORO;  Surgeon: [name], MD;  Location: [facility];  Service: Cardiopulmonary;  Laterality: Bilateral;  . Mastectomy complete / simple w/ sentinel node biopsy Left 04/02/2014    axillary SLN  . Mastectomy complete / simple Right 04/02/2014    PROPHYLACTIC  . Portacath placement Right 04/02/2014  . Breast biopsy Left 11/2013 X 3  . Total abdominal hysterectomy    . Cardiac catheterization  2014  . Simple mastectomy with axillary sentinel node biopsy Bilateral 04/02/2014    Procedure: LEFT TOTAL MASTECTOMY WITH LEFT AXILLARY SENTINEL NODE BIOPSY, RIGHT PROPHYLACTIC MASTETCTOMY;  Surgeon: [name], MD;  Location: [place];  Service: General;  Laterality: Bilateral;  . Portacath placement N/A 04/02/2014    Procedure: INSERTION PORT-A-CATH;  Surgeon: [name], MD;  Location: [place];  Service: General;  Laterality: N/A;  . Left heart catheterization with coronary angiogram N/A 12/26/2012    Procedure: LEFT HEART CATHETERIZATION WITH CORONARY ANGIOGRAM;  Surgeon: Lorretta Harp, MD;  Location[facility] CATH LAB;  Service: Cardiovascular;  Laterality: N/A;  . Breast reconstruction with placement of tissue expander and flex hd (acellular hydrated dermis) Bilateral 09/25/2014    Procedure: PLACEMENT OF BILATERAL TISSUE EXPANDER AND  ACELLULAR DERMIS FOR BREAST RECONSTRUCTION ;  Surgeon: [name], MD;  Location: [place];  Service: Plastics;  Laterality: Bilateral;    FAMILY HISTORY Family History  Problem Relation Age of Onset  . Heart failure Mother   . Colon cancer Father 67    stomach cancer also in 74s  . Throat cancer Brother 19    smoker  . Heart attack Maternal Grandmother   . Colon cancer Paternal Grandmother 66  . Cancer Paternal Grandfather     kidney and bladder  . Throat cancer Brother 37    throat  cancer, smoker  . Ovarian cancer Sister 74    ovarian cancer at 21, colorectal cancer at 96  . Breast cancer Paternal Aunt 46  . Ovarian cancer Other 60    niece with ovarian cancer   the patient's father died at the age of 14 from metastatic stomach cancer the patient's father's mother died from colon cancer at the age of 57. The patient's mother died at the age of 31. The patient had 2 brothers and 2 sisters. One brother died at the age of 67 from throat cancer metastatic to the lung. He was a smoker. A second brother Was diagnosed at age 103 with throat cancer metastatic to lung. He has been given 90 days to live" is not looking good". One sister died from colon cancer metastatic to bone. One sister died of a drug overdose. There is no history of breast or bearing cancer in the family to the patient's knowledge.  GYNECOLOGIC HISTORY:  No LMP recorded. Patient has had a hysterectomy. Menarche age 70, first live birth age 71, the patient is [place] P4. She underwent hysterectomy with bilateral  salpingo-oophorectomy approximately 1990. She took hormone replacement for approximately 2 years.  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    ADVANCED DIRECTIVES: Not in place   HEALTH MAINTENANCE: History  Substance Use Topics  . Smoking status: Former Smoker -- 0.00 packs/day for 37 years    Quit date: 03/29/2014  . Smokeless tobacco: Never Used     Comment: uses e-cig 01/03/13, starting using Chantix 11/13/13  . Alcohol Use: No     Colonoscopy: August 2015  PAP: May 2013  Bone density:  Lipid panel:  Allergies  Allergen Reactions  . Contrast Media [Iodinated Diagnostic Agents] Anaphylaxis    Heart stops and breathing stops; patient ok with 13 hour prep  . Prednisone Swelling and Palpitations    Benadryl prophylaxis    Current Outpatient Prescriptions  Medication Sig  Dispense Refill  . buPROPion (WELLBUTRIN XL) 300 MG 24 hr tablet Take 300 mg by mouth daily.    Marland Kitchen doxycycline (VIBRAMYCIN) 100 MG capsule Take 1 capsule (100 mg total) by mouth 2 (two) times daily. 14 capsule 0  . gabapentin (NEURONTIN) 300 MG capsule Take 1 capsule (300 mg total) by mouth 2 (two) times daily. (Patient taking differently: Take 150 mg by mouth 2 (two) times daily. ) 120 capsule 6  . ibuprofen (ADVIL,MOTRIN) 400 MG tablet Take 1 tablet (400 mg total) by mouth 3 (three) times daily. 60 tablet 0  . levothyroxine (SYNTHROID, LEVOTHROID) 75 MCG tablet Take 75 mcg by mouth daily before breakfast.    . lidocaine-prilocaine (EMLA) cream Apply 1 application topically as needed (for chemo injections).   0  . LORazepam (ATIVAN) 0.5 MG tablet Take 1 tablet (0.5 mg total) by mouth at bedtime as needed (Nausea or vomiting). (Patient taking differently: Take 0.25 mg by mouth every 8 (eight) hours as needed for anxiety or sleep. ) 30 tablet 0  . montelukast (SINGULAIR) 10 MG tablet Take 10 mg by mouth at bedtime.     Marland Kitchen oxyCODONE (OXY IR/ROXICODONE) 5 MG immediate release tablet Take 1-2 tablets (5-10 mg total) by mouth every 4 (four) hours as needed for moderate pain. 50 tablet 0  . rizatriptan (MAXALT-MLT) 10 MG disintegrating tablet Take 1 tablet (10 mg total) by mouth as needed for migraine. May repeat in 2 hours if needed 15 tablet 11  . traZODone (DESYREL) 100 MG tablet Take 1 tablet (100 mg total) by mouth at bedtime. 30 tablet 2  . verapamil (CALAN-SR) 120 MG CR tablet TAKE 1 TABLET BY MOUTH AT BEDTIME ( MAX 30 DAY SUPPLY PER INSURANCE) 30 tablet 11  . DUONEB 0.5-2.5 (3) MG/3ML SOLN Inhale 3 mLs into the lungs every 6 (six) hours as needed (for shortness of breath).   3  . furosemide (LASIX) 20 MG tablet Take 0.5 tablets (10 mg total) by mouth daily. 30 tablet 0  . ondansetron (ZOFRAN) 8 MG tablet Take 8 mg by mouth as needed. For N/V  1  . potassium chloride SA (K-DUR,KLOR-CON) 20 MEQ tablet  Take 1 tablet (20 mEq total) by mouth once. 30 tablet 1  . PROAIR HFA 108 (90 BASE) MCG/ACT inhaler Inhale 2 puffs into the lungs every 4 (four) hours as needed for wheezing or shortness of breath.     . prochlorperazine (COMPAZINE) 10 MG tablet Take 10 mg by mouth as needed for nausea or vomiting. Takes after chemo injections  1  . senna-docusate (SENOKOT-S) 8.6-50 MG per tablet Take 1 tablet by mouth at bedtime as needed for mild constipation. (Patient not taking: Reported on 10/02/2014) 20 tablet 0  . SUMAtriptan (IMITREX) 6 MG/0.5ML SOLN injection Inject 6 mg into the skin every 2 (two) hours as needed for migraine or headache. May repeat in 2 hours if headache persists or recurs. Max 2 doses in 24 hours    . Trastuzumab (HERCEPTIN IV) Inject 462 mg into the vein every 21 ( twenty-one) days.     No current facility-administered medications for this visit.    OBJECTIVE: Middle-aged white woman in no acute distress Filed Vitals:   10/02/14 1122  BP: 117/76  Pulse: 76  Temp: 98 F (36.7 C)  Resp: 18     Body mass index is 27.68 kg/(m^2).    ECOG FS:1 - Symptomatic but completely ambulatory   Skin: warm, dry  HEENT: sclerae anicteric, conjunctivae pink, oropharynx clear. No thrush or mucositis.  Lymph Nodes: No cervical or supraclavicular lymphadenopathy  Lungs: clear to auscultation bilaterally, no rales, wheezes, or rhonci  Heart: regular rate and rhythm  Abdomen: round, soft, non tender, positive bowel sounds  Musculoskeletal: No focal spinal tenderness, no peripheral edema  Neuro: non focal, well oriented, positive affect  Breasts: deferred, bilateral breast drains visible however with serosanguinous drainage  LAB RESULTS:  CMP     Component Value Date/Time   NA 144 10/02/2014 1107   NA 141 10/01/2014 0720   K 3.8 10/02/2014 1107   K 3.1* 10/01/2014 0720   CL 108 10/01/2014 0720   CO2 25 10/02/2014 1107   CO2 25 10/01/2014 0720   GLUCOSE 96 10/02/2014 1107   GLUCOSE 81  10/01/2014 0720   BUN 9.9 10/02/2014 1107   BUN 7 10/01/2014 0720   CREATININE 0.9 10/02/2014 1107   CREATININE 0.84 10/01/2014 0720   CALCIUM 9.0 10/02/2014 1107   CALCIUM 8.7 10/01/2014 0720   PROT 5.9* 10/02/2014 1107   PROT 6.1 09/28/2014 2024   ALBUMIN 3.0* 10/02/2014 1107   ALBUMIN 3.0* 09/28/2014 2024   AST 11 10/02/2014 1107   AST 14 09/28/2014 2024   ALT 13 10/02/2014 1107   ALT 10 09/28/2014 2024   ALKPHOS 83 10/02/2014 1107   ALKPHOS 80 09/28/2014 2024   BILITOT 0.49 10/02/2014 1107   BILITOT 0.6 09/28/2014 2024   GFRNONAA 78* 10/01/2014 0720   GFRAA >90 10/01/2014 0720    I No results found for: SPEP  Lab Results  Component Value Date   WBC 3.9 10/02/2014   NEUTROABS 2.8 10/02/2014   HGB 8.7* 10/02/2014   HCT 26.7* 10/02/2014   MCV 100.8 10/02/2014   PLT 175 10/02/2014      Chemistry      Component Value Date/Time   NA 144 10/02/2014 1107   NA 141 10/01/2014 0720   K 3.8 10/02/2014 1107   K 3.1* 10/01/2014 0720   CL 108 10/01/2014 0720   CO2 25 10/02/2014 1107   CO2 25 10/01/2014 0720   BUN 9.9 10/02/2014 1107   BUN 7 10/01/2014 0720   CREATININE 0.9 10/02/2014 1107   CREATININE 0.84 10/01/2014 0720      Component Value Date/Time   CALCIUM 9.0 10/02/2014 1107   CALCIUM 8.7 10/01/2014 0720   ALKPHOS 83 10/02/2014 1107   ALKPHOS 80 09/28/2014 2024   AST 11 10/02/2014 1107   AST 14 09/28/2014 2024   ALT 13 10/02/2014 1107   ALT 10 09/28/2014 2024  BILITOT 0.49 10/02/2014 1107   BILITOT 0.6 09/28/2014 2024       No results found for: LABCA2  No components found for: OYDXA128  No results for input(s): INR in the last 168 hours.  Urinalysis    Component Value Date/Time   COLORURINE YELLOW 09/29/2014 0119   APPEARANCEUR CLEAR 09/29/2014 0119   LABSPEC 1.010 09/29/2014 0119   LABSPEC 1.010 07/10/2014 1004   PHURINE 5.5 09/29/2014 0119   GLUCOSEU 100* 09/29/2014 0119   GLUCOSEU Negative 07/10/2014 1004   HGBUR MODERATE* 09/29/2014  0119   BILIRUBINUR NEGATIVE 09/29/2014 0119   KETONESUR NEGATIVE 09/29/2014 0119   PROTEINUR NEGATIVE 09/29/2014 0119   UROBILINOGEN 1.0 09/29/2014 0119   UROBILINOGEN 0.2 07/10/2014 1004   NITRITE NEGATIVE 09/29/2014 0119   NITRITE Negative 07/10/2014 1004   LEUKOCYTESUR NEGATIVE 09/29/2014 0119    STUDIES: Most recent echocardiogram on 07/24/14 showed an EF of 60-65%  ASSESSMENT: 54 y.o. BRCA negative [place] woman status post left breast biopsy 02/22/2014 for a clinical T2/T3 NX, stage II or III invasive ductal carcinoma, grade 3, estrogen and progesterone receptor negative, with an MIB-1 of 83%, and HER-2 amplified with a signals a ratio of 2.07and a copy number per cell of 4.75  (1) biopsy of an additional area of enhancement in the left breast 03/08/2014 showed ductal carcinoma in situ, estrogen and progesterone receptor negative.  (2) status post bilateral mastectomies with left sentinel lymph node sampling 04/02/2014, showing:  (a) on the right, benign breast tissue including a single negative lymph node  (b) On  the left, a  pT1c pN0, stage IA invasive ductal carcinoma, grade 3, with negative margins  (3) adjuvant chemotherapy started 05/08/2014, consisting of carboplatin and docetaxel given every 3 weeks x6, together with trastuzumab, with neulasta day 2   (a) docetaxel removed from final cycle 08/21/2014 because of persistent neuropathy symptoms  (4) trastuzumab to be continued to complete a year (through OCT 2016) Most recent echocardiogram 03/21/2014 showed a normal ejection fraction  (a) echo 07/24/2014 showed a well-preserved ejection fraction  (5) reconstruction with bilateral silicone implants scheduled for March 2016 ([place])  (6) Hx of smoking: The patient quit smoking 03/30/2014  (7) 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 She is  asking that we hold treatment at this time, and she resume with the next dose, and I agree. She hopes to have the drains removed next week, and she should finish up with her antibiotics with the week as well.   The labs were reviewed in detail and there has been a dip in her hgb, but it has actually improved since it was last drawn. This is likely related to her surgery. Her potassium has improved as well since the supplement she was given earlier this week.   Stacy Fernandez is very concerned about her bilpedal/ankle edema. She is interested in a "fluid pill" because compression stockings and elevation have not worked. I am willing to write for $RemoveB'10mg'[id number]$  of lasix only, to avoid dropping her BP too much, and every day that she takes the lasix she is to take 20 meq of potassium with it. She was agreeable to this condition. She will also avoid sodium in her diet and we reviewed a list of foods that have "hidden salt" in them including cured meats and frozen dinners. She will check her blood pressure regularly at home.  Stacy Fernandez will return in 3  weeks for her next trastuzumab dose. She will be due for an echocardiogram in April and she will see me later that month. She understands and agrees with this plan. She knows the goal of treatment in her case is cure. She has been encouraged to call with a any issues that might arise before her next visit here.   Laurie Panda, NP   10/02/2014 1:35 PM

## 2014-10-04 ENCOUNTER — Telehealth: Payer: Self-pay | Admitting: Neurology

## 2014-10-04 NOTE — Telephone Encounter (Signed)
Called patient to schedule next injection and she states she would like to hold off for now due to her currently battling other illnesses. She will call the office when she is ready to schedule

## 2014-10-05 LAB — CULTURE, BLOOD (ROUTINE X 2)
CULTURE: NO GROWTH
Culture: NO GROWTH

## 2014-10-10 ENCOUNTER — Other Ambulatory Visit: Payer: Self-pay | Admitting: Nurse Practitioner

## 2014-10-10 DIAGNOSIS — C50412 Malignant neoplasm of upper-outer quadrant of left female breast: Secondary | ICD-10-CM

## 2014-10-17 ENCOUNTER — Telehealth: Payer: Self-pay | Admitting: Nurse Practitioner

## 2014-10-17 NOTE — Telephone Encounter (Signed)
Spoke   With the patient and she is aware of er echo

## 2014-10-23 ENCOUNTER — Other Ambulatory Visit (HOSPITAL_BASED_OUTPATIENT_CLINIC_OR_DEPARTMENT_OTHER): Payer: BLUE CROSS/BLUE SHIELD

## 2014-10-23 ENCOUNTER — Ambulatory Visit (HOSPITAL_BASED_OUTPATIENT_CLINIC_OR_DEPARTMENT_OTHER): Payer: BLUE CROSS/BLUE SHIELD

## 2014-10-23 DIAGNOSIS — C50412 Malignant neoplasm of upper-outer quadrant of left female breast: Secondary | ICD-10-CM

## 2014-10-23 DIAGNOSIS — C50812 Malignant neoplasm of overlapping sites of left female breast: Secondary | ICD-10-CM | POA: Diagnosis not present

## 2014-10-23 DIAGNOSIS — Z5112 Encounter for antineoplastic immunotherapy: Secondary | ICD-10-CM | POA: Diagnosis not present

## 2014-10-23 DIAGNOSIS — C50419 Malignant neoplasm of upper-outer quadrant of unspecified female breast: Secondary | ICD-10-CM

## 2014-10-23 LAB — COMPREHENSIVE METABOLIC PANEL (CC13)
ALBUMIN: 3.5 g/dL (ref 3.5–5.0)
ALK PHOS: 87 U/L (ref 40–150)
ALT: 10 U/L (ref 0–55)
AST: 13 U/L (ref 5–34)
Anion Gap: 7 mEq/L (ref 3–11)
BUN: 10.8 mg/dL (ref 7.0–26.0)
CO2: 25 mEq/L (ref 22–29)
Calcium: 8.6 mg/dL (ref 8.4–10.4)
Chloride: 111 mEq/L — ABNORMAL HIGH (ref 98–109)
Creatinine: 1.2 mg/dL — ABNORMAL HIGH (ref 0.6–1.1)
EGFR: 53 mL/min/{1.73_m2} — ABNORMAL LOW (ref >90)
Glucose: 90 mg/dl (ref 70–140)
POTASSIUM: 4.2 meq/L (ref 3.5–5.1)
SODIUM: 142 meq/L (ref 136–145)
TOTAL PROTEIN: 6.1 g/dL — AB (ref 6.4–8.3)
Total Bilirubin: 0.38 mg/dL (ref 0.20–1.20)

## 2014-10-23 LAB — CBC WITH DIFFERENTIAL/PLATELET
BASO%: 0.8 % (ref 0.0–2.0)
Basophils Absolute: 0 10*3/uL (ref 0.0–0.1)
EOS%: 4.9 % (ref 0.0–7.0)
Eosinophils Absolute: 0.1 10*3/uL (ref 0.0–0.5)
HEMATOCRIT: 30.2 % — AB (ref 34.8–46.6)
HGB: 9.8 g/dL — ABNORMAL LOW (ref 11.6–15.9)
LYMPH%: 34.8 % (ref 14.0–49.7)
MCH: 31.7 pg (ref 25.1–34.0)
MCHC: 32.5 g/dL (ref 31.5–36.0)
MCV: 97.7 fL (ref 79.5–101.0)
MONO#: 0.3 10*3/uL (ref 0.1–0.9)
MONO%: 12.1 % (ref 0.0–14.0)
NEUT%: 47.4 % (ref 38.4–76.8)
NEUTROS ABS: 1.2 10*3/uL — AB (ref 1.5–6.5)
PLATELETS: 157 10*3/uL (ref 145–400)
RBC: 3.09 10*6/uL — AB (ref 3.70–5.45)
RDW: 12.8 % (ref 11.2–14.5)
WBC: 2.5 10*3/uL — ABNORMAL LOW (ref 3.9–10.3)
lymph#: 0.9 10*3/uL (ref 0.9–3.3)

## 2014-10-23 MED ORDER — TRASTUZUMAB CHEMO INJECTION 440 MG
6.0000 mg/kg | Freq: Once | INTRAVENOUS | Status: AC
  Administered 2014-10-23: 462 mg via INTRAVENOUS
  Filled 2014-10-23: qty 22

## 2014-10-23 MED ORDER — SODIUM CHLORIDE 0.9 % IV SOLN
Freq: Once | INTRAVENOUS | Status: AC
  Administered 2014-10-23: 11:00:00 via INTRAVENOUS

## 2014-10-23 MED ORDER — DIPHENHYDRAMINE HCL 25 MG PO CAPS
ORAL_CAPSULE | ORAL | Status: AC
  Filled 2014-10-23: qty 1

## 2014-10-23 MED ORDER — ACETAMINOPHEN 325 MG PO TABS
ORAL_TABLET | ORAL | Status: AC
  Filled 2014-10-23: qty 2

## 2014-10-23 MED ORDER — SODIUM CHLORIDE 0.9 % IJ SOLN
10.0000 mL | INTRAMUSCULAR | Status: DC | PRN
  Administered 2014-10-23: 10 mL
  Filled 2014-10-23: qty 10

## 2014-10-23 MED ORDER — DIPHENHYDRAMINE HCL 25 MG PO CAPS
25.0000 mg | ORAL_CAPSULE | Freq: Once | ORAL | Status: AC
  Administered 2014-10-23: 25 mg via ORAL

## 2014-10-23 MED ORDER — ACETAMINOPHEN 325 MG PO TABS
650.0000 mg | ORAL_TABLET | Freq: Once | ORAL | Status: AC
  Administered 2014-10-23: 650 mg via ORAL

## 2014-10-23 MED ORDER — HEPARIN SOD (PORK) LOCK FLUSH 100 UNIT/ML IV SOLN
500.0000 [IU] | Freq: Once | INTRAVENOUS | Status: AC | PRN
  Administered 2014-10-23: 500 [IU]
  Filled 2014-10-23: qty 5

## 2014-10-29 ENCOUNTER — Ambulatory Visit (HOSPITAL_COMMUNITY)
Admission: RE | Admit: 2014-10-29 | Discharge: 2014-10-29 | Disposition: A | Discharge status: Home / Self Care | Payer: BLUE CROSS/BLUE SHIELD | Source: Ambulatory Visit | Attending: Nurse Practitioner | Admitting: Nurse Practitioner

## 2014-10-29 DIAGNOSIS — Z5111 Encounter for antineoplastic chemotherapy: Secondary | ICD-10-CM | POA: Diagnosis not present

## 2014-10-29 DIAGNOSIS — Z87891 Personal history of nicotine dependence: Secondary | ICD-10-CM | POA: Diagnosis not present

## 2014-10-29 DIAGNOSIS — I2510 Atherosclerotic heart disease of native coronary artery without angina pectoris: Secondary | ICD-10-CM | POA: Diagnosis not present

## 2014-10-29 DIAGNOSIS — C50412 Malignant neoplasm of upper-outer quadrant of left female breast: Secondary | ICD-10-CM | POA: Diagnosis present

## 2014-10-29 DIAGNOSIS — J449 Chronic obstructive pulmonary disease, unspecified: Secondary | ICD-10-CM | POA: Diagnosis not present

## 2014-10-29 NOTE — Progress Notes (Signed)
*  PRELIMINARY RESULTS* Echocardiogram 2D Echocardiogram has been performed.  Leavy Cella 10/29/2014, 11:03 AM

## 2014-11-10 ENCOUNTER — Other Ambulatory Visit: Payer: Self-pay | Admitting: Neurology

## 2014-11-12 ENCOUNTER — Other Ambulatory Visit: Payer: Self-pay

## 2014-11-12 MED ORDER — VERAPAMIL HCL ER 120 MG PO TBCR: EXTENDED_RELEASE_TABLET | ORAL | Status: DC

## 2014-11-13 ENCOUNTER — Encounter: Payer: Self-pay | Admitting: Nurse Practitioner

## 2014-11-13 ENCOUNTER — Ambulatory Visit (HOSPITAL_BASED_OUTPATIENT_CLINIC_OR_DEPARTMENT_OTHER): Payer: BLUE CROSS/BLUE SHIELD | Admitting: Nurse Practitioner

## 2014-11-13 ENCOUNTER — Other Ambulatory Visit (HOSPITAL_BASED_OUTPATIENT_CLINIC_OR_DEPARTMENT_OTHER): Payer: BLUE CROSS/BLUE SHIELD

## 2014-11-13 ENCOUNTER — Ambulatory Visit (HOSPITAL_BASED_OUTPATIENT_CLINIC_OR_DEPARTMENT_OTHER): Payer: BLUE CROSS/BLUE SHIELD

## 2014-11-13 VITALS — BP 117/70 | HR 84 | Temp 97.9°F | Resp 18 | Ht 66.0 in | Wt 171.0 lb

## 2014-11-13 DIAGNOSIS — C50812 Malignant neoplasm of overlapping sites of left female breast: Secondary | ICD-10-CM

## 2014-11-13 DIAGNOSIS — C50419 Malignant neoplasm of upper-outer quadrant of unspecified female breast: Secondary | ICD-10-CM

## 2014-11-13 DIAGNOSIS — Z171 Estrogen receptor negative status [ER-]: Secondary | ICD-10-CM | POA: Diagnosis not present

## 2014-11-13 DIAGNOSIS — C50412 Malignant neoplasm of upper-outer quadrant of left female breast: Secondary | ICD-10-CM

## 2014-11-13 DIAGNOSIS — Z5112 Encounter for antineoplastic immunotherapy: Secondary | ICD-10-CM

## 2014-11-13 LAB — CBC WITH DIFFERENTIAL/PLATELET
BASO%: 0.8 % (ref 0.0–2.0)
Basophils Absolute: 0 10*3/uL (ref 0.0–0.1)
EOS%: 3 % (ref 0.0–7.0)
Eosinophils Absolute: 0.1 10*3/uL (ref 0.0–0.5)
HEMATOCRIT: 31.3 % — AB (ref 34.8–46.6)
HGB: 10.6 g/dL — ABNORMAL LOW (ref 11.6–15.9)
LYMPH%: 25.5 % (ref 14.0–49.7)
MCH: 31.8 pg (ref 25.1–34.0)
MCHC: 33.9 g/dL (ref 31.5–36.0)
MCV: 94 fL (ref 79.5–101.0)
MONO#: 0.3 10*3/uL (ref 0.1–0.9)
MONO%: 7.2 % (ref 0.0–14.0)
NEUT#: 2.3 10*3/uL (ref 1.5–6.5)
NEUT%: 63.5 % (ref 38.4–76.8)
PLATELETS: 160 10*3/uL (ref 145–400)
RBC: 3.33 10*6/uL — AB (ref 3.70–5.45)
RDW: 12.6 % (ref 11.2–14.5)
WBC: 3.6 10*3/uL — ABNORMAL LOW (ref 3.9–10.3)
lymph#: 0.9 10*3/uL (ref 0.9–3.3)

## 2014-11-13 LAB — COMPREHENSIVE METABOLIC PANEL (CC13)
ALBUMIN: 3.8 g/dL (ref 3.5–5.0)
ALK PHOS: 94 U/L (ref 40–150)
ALT: 13 U/L (ref 0–55)
AST: 15 U/L (ref 5–34)
Anion Gap: 12 mEq/L — ABNORMAL HIGH (ref 3–11)
BILIRUBIN TOTAL: 0.33 mg/dL (ref 0.20–1.20)
BUN: 9.7 mg/dL (ref 7.0–26.0)
CO2: 20 mEq/L — ABNORMAL LOW (ref 22–29)
Calcium: 8.9 mg/dL (ref 8.4–10.4)
Chloride: 111 mEq/L — ABNORMAL HIGH (ref 98–109)
Creatinine: 1.2 mg/dL — ABNORMAL HIGH (ref 0.6–1.1)
EGFR: 53 mL/min/{1.73_m2} — ABNORMAL LOW (ref >90)
Glucose: 84 mg/dl (ref 70–140)
Potassium: 4.3 mEq/L (ref 3.5–5.1)
SODIUM: 143 meq/L (ref 136–145)
Total Protein: 6.3 g/dL — ABNORMAL LOW (ref 6.4–8.3)

## 2014-11-13 MED ORDER — SODIUM CHLORIDE 0.9 % IJ SOLN
10.0000 mL | INTRAMUSCULAR | Status: DC | PRN
  Administered 2014-11-13: 10 mL
  Filled 2014-11-13: qty 10

## 2014-11-13 MED ORDER — HEPARIN SOD (PORK) LOCK FLUSH 100 UNIT/ML IV SOLN
500.0000 [IU] | Freq: Once | INTRAVENOUS | Status: AC | PRN
  Administered 2014-11-13: 500 [IU]
  Filled 2014-11-13: qty 5

## 2014-11-13 MED ORDER — ACETAMINOPHEN 325 MG PO TABS
650.0000 mg | ORAL_TABLET | Freq: Once | ORAL | Status: AC
  Administered 2014-11-13: 650 mg via ORAL

## 2014-11-13 MED ORDER — DIPHENHYDRAMINE HCL 25 MG PO CAPS
ORAL_CAPSULE | ORAL | Status: AC
  Filled 2014-11-13: qty 2

## 2014-11-13 MED ORDER — ACETAMINOPHEN 325 MG PO TABS
ORAL_TABLET | ORAL | Status: AC
  Filled 2014-11-13: qty 2

## 2014-11-13 MED ORDER — DIPHENHYDRAMINE HCL 25 MG PO CAPS
25.0000 mg | ORAL_CAPSULE | Freq: Once | ORAL | Status: AC
  Administered 2014-11-13: 25 mg via ORAL

## 2014-11-13 MED ORDER — TRASTUZUMAB CHEMO INJECTION 440 MG
6.0000 mg/kg | Freq: Once | INTRAVENOUS | Status: AC
  Administered 2014-11-13: 462 mg via INTRAVENOUS
  Filled 2014-11-13: qty 22

## 2014-11-13 MED ORDER — SODIUM CHLORIDE 0.9 % IV SOLN
Freq: Once | INTRAVENOUS | Status: AC
  Administered 2014-11-13: 12:00:00 via INTRAVENOUS

## 2014-11-13 NOTE — Progress Notes (Signed)
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 She brought it to her primary physician's attention him a and on 02/21/2014 she underwent bilateral diagnostic mammography with tomography at [facility]. The breast composition was category B. In the left breast at the 3:00 position there was an irregular mass associated with skin retraction. Ultrasound performed on the same day confirmed a 1.1 cm irregular solid mass in the left breast at the 3:00 position. There were no abnormalities by sonography in the left axilla or in the superior portion of the breast, which is for the patient experiences some tenderness.  Biopsy of the left breast area in question 02/22/2014 showed ([id number]) and invasive ductal carcinoma, grade 3, estrogen and progesterone receptor negative, with an MIB-1 of 83%, and HER-2 amplified, the signals ratio being 2.07, and a copy number per cell 4.75.  On 02/28/2014 the patient underwent bilateral breast MRI, which showed in the posterior third of the left breast at the 3:00 position an irregular spiculated mass measuring 1.8 cm. The left breast was unremarkable and there were no abnormal appearing lymph nodes.  The patient's subsequent history is  as detailed below  INTERVAL HISTORY: Stacy Fernandez returns today for follow up of her breast cancer, accompanied by her husband [name]. She is due for trastuzumab today. The interval history is remarkable for her last expander surgery, she will receive implants with her next procedure. Stacy Fernandez found this to be incredibly painful, and she continues on oxycodone PRN for pain.   REVIEW OF SYSTEMS:  Stacy Fernandez continues to have numbness to the bottom of her bilateral feet. She stopped taking her gabapentin regularly, in an attempt to wean herself off the drug. Occasionally at night she feels burning to the tops of her feet, which is new and more likely related to her mild ankle swelling. She takes $RemoveB'5mg'rBkcmroN$  lasix PRN swelling (though she was prescribed $RemoveBefore'10mg'EWNAIfONMTUkb$ ) and takes a potassium supplement every other day. Her hair is growing back in gray, and she is unhappy with this. Otherwise she has no other complaints. A detailed review of systems is otherwise stable.   PAST MEDICAL HISTORY: Past Medical History  Diagnosis Date  . Hypothyroidism   . Shortness of breath     2D ECHO, 04/20/2012 - EF >55%, normal; EXERCISE STRESS TEST, 04/20/2012 - normal, EKG negative for ischemia, no ECG changes  . COPD (chronic obstructive pulmonary disease)   . Emphysema   . Hematuria     cause unknow- saw Urologist  . Hot flashes   . Anginal pain     Dr Claiborne Billings cardiologist- test normal.  " think it is from breast cancer"  . Brain aneurysm      Dr Jannifer Franklin neurologist is monitoring  . Anxiety     Due to situation, CA breast  . Chronic bronchitis     "I've had it quite  a few times" (04/02/2014)  . GERD (gastroesophageal reflux disease)   . Migraine     "treated w/botox for them" (04/02/2014)  . Wears glasses   . Wears dentures     top    PAST SURGICAL HISTORY: Past Surgical History  Procedure Laterality Date  . Cholecystectomy    . Appendectomy    . Laparoscopic nissen fundoplication  09/5595    for GERD- not a problem  . Video  bronchoscopy Bilateral 01/16/2013    Procedure: VIDEO BRONCHOSCOPY WITHOUT FLUORO;  Surgeon: [name], MD;  Location: [facility];  Service: Cardiopulmonary;  Laterality: Bilateral;  . Mastectomy complete / simple w/ sentinel node biopsy Left 04/02/2014    axillary SLN  . Mastectomy complete / simple Right 04/02/2014    PROPHYLACTIC  . Portacath placement Right 04/02/2014  . Breast biopsy Left 11/2013 X 3  . Total abdominal hysterectomy    . Cardiac catheterization  2014  . Simple mastectomy with axillary sentinel node biopsy Bilateral 04/02/2014    Procedure: LEFT TOTAL MASTECTOMY WITH LEFT AXILLARY SENTINEL NODE BIOPSY, RIGHT PROPHYLACTIC MASTETCTOMY;  Surgeon: [name], MD;  Location: [place];  Service: General;  Laterality: Bilateral;  . Portacath placement N/A 04/02/2014    Procedure: INSERTION PORT-A-CATH;  Surgeon: [name], MD;  Location: [place];  Service: General;  Laterality: N/A;  . Left heart catheterization with coronary angiogram N/A 12/26/2012    Procedure: LEFT HEART CATHETERIZATION WITH CORONARY ANGIOGRAM;  Surgeon: Lorretta Harp, MD;  Location: [facility] C[facility] LAB;  Service: Cardiovascular;  Laterality: N/A;  . Breast reconstruction with placement of tissue expander and flex hd (acellular hydrated dermis) Bilateral 09/25/2014    Procedure: PLACEMENT OF BILATERAL TISSUE EXPANDER AND  ACELLULAR DERMIS FOR BREAST RECONSTRUCTION ;  Surgeon: [name], MD;  Location: [place];  Service: Plastics;  Laterality: Bilateral;    FAMILY HISTORY Family History  Problem Relation Age of Onset  . Heart failure Mother   . Colon cancer Father 17    stomach cancer also in 78s  . Throat cancer Brother 85    smoker  . Heart attack Maternal Grandmother   . Colon cancer Paternal Grandmother 63  . Cancer Paternal Grandfather     kidney and bladder  . Throat cancer Brother 37    throat cancer, smoker  . Ovarian cancer Sister 75    ovarian cancer at 68, colorectal  cancer at 50  . Breast cancer Paternal Aunt 48  . Ovarian cancer Other 53    niece with ovarian cancer   the patient's father died at the age of 46 from metastatic stomach cancer the patient's father's mother died from colon cancer at the age of 60. The patient's mother died at the age of 89. The patient had 2 brothers and 2 sisters. One brother died at the age of 48 from throat cancer metastatic to the lung. He was a smoker. A second brother Was diagnosed at age 68 with throat cancer metastatic to lung. He has been given 90 days to live" is not looking good". One sister died from colon cancer metastatic to bone. One sister died of a drug overdose. There is no history of breast or bearing cancer in the family to the patient's knowledge.  GYNECOLOGIC HISTORY:  No LMP recorded. Patient has had a hysterectomy. Menarche age 68, first live birth age 80, the patient is [place] P4. She underwent hysterectomy with bilateral salpingo-oophorectomy approximately 1990. She took hormone replacement for approximately 2  years.  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    ADVANCED DIRECTIVES: Not in place   HEALTH MAINTENANCE: History  Substance Use Topics  . Smoking status: Former Smoker -- 0.00 packs/day for 37 years    Quit date: 03/29/2014  . Smokeless tobacco: Never Used     Comment: uses e-cig 01/03/13, starting using Chantix 11/13/13  . Alcohol Use: No     Colonoscopy: August 2015  PAP: May 2013  Bone density:  Lipid panel:  Allergies  Allergen Reactions  . Contrast Media [Iodinated Diagnostic Agents] Anaphylaxis    Heart stops and breathing stops; patient ok with 13 hour prep  . Prednisone Swelling and Palpitations    Benadryl prophylaxis    Current Outpatient Prescriptions  Medication Sig Dispense Refill  . buPROPion (WELLBUTRIN XL) 300 MG 24 hr tablet Take 300 mg by  mouth daily.    . furosemide (LASIX) 20 MG tablet Take 0.5 tablets (10 mg total) by mouth daily. 30 tablet 0  . gabapentin (NEURONTIN) 300 MG capsule Take 1 capsule (300 mg total) by mouth 2 (two) times daily. (Patient taking differently: Take 150 mg by mouth 2 (two) times daily. ) 120 capsule 6  . levothyroxine (SYNTHROID, LEVOTHROID) 75 MCG tablet Take 75 mcg by mouth daily before breakfast.    . lidocaine-prilocaine (EMLA) cream Apply 1 application topically as needed (for chemo injections).   0  . montelukast (SINGULAIR) 10 MG tablet Take 10 mg by mouth at bedtime.     Marland Kitchen oxyCODONE (OXY IR/ROXICODONE) 5 MG immediate release tablet Take 1-2 tablets (5-10 mg total) by mouth every 4 (four) hours as needed for moderate pain. 50 tablet 0  . potassium chloride SA (K-DUR,KLOR-CON) 20 MEQ tablet Take 1 tablet (20 mEq total) by mouth once. 30 tablet 1  . PROAIR HFA 108 (90 BASE) MCG/ACT inhaler Inhale 2 puffs into the lungs every 4 (four) hours as needed for wheezing or shortness of breath.     . rizatriptan (MAXALT-MLT) 10 MG disintegrating tablet Take 1 tablet (10 mg total) by mouth as needed for migraine. May repeat in 2 hours if needed 15 tablet 11  . Trastuzumab (HERCEPTIN IV) Inject 462 mg into the vein every 21 ( twenty-one) days.    . traZODone (DESYREL) 100 MG tablet TAKE 1 TABLET (100 MG TOTAL) BY MOUTH AT BEDTIME. 30 tablet 1  . verapamil (CALAN-SR) 120 MG CR tablet TAKE 1 TABLET BY MOUTH AT BEDTIME ( MAX 30 DAY SUPPLY PER INSURANCE) 30 tablet 6  . DUONEB 0.5-2.5 (3) MG/3ML SOLN Inhale 3 mLs into the lungs every 6 (six) hours as needed (for shortness of breath).   3  . ibuprofen (ADVIL,MOTRIN) 400 MG tablet Take 1 tablet (400 mg total) by mouth 3 (three) times daily. (Patient not taking: Reported on 11/13/2014) 60 tablet 0  . LORazepam (ATIVAN) 0.5 MG tablet Take 1 tablet (0.5 mg total) by mouth at bedtime as needed (Nausea or vomiting). (Patient not taking: Reported on 11/13/2014) 30 tablet 0  .  ondansetron (ZOFRAN) 8 MG tablet Take 8 mg by mouth as needed. For N/V  1  . prochlorperazine (COMPAZINE) 10 MG tablet Take 10 mg by mouth as needed for nausea or vomiting. Takes after chemo injections  1  . senna-docusate (SENOKOT-S) 8.6-50 MG per tablet Take 1 tablet by mouth at bedtime as needed for mild  constipation. (Patient not taking: Reported on 10/02/2014) 20 tablet 0  . SUMAtriptan (IMITREX) 6 MG/0.5ML SOLN injection Inject 6 mg into the skin every 2 (two) hours as needed for migraine or headache. May repeat in 2 hours if headache persists or recurs. Max 2 doses in 24 hours     No current facility-administered medications for this visit.    OBJECTIVE: Middle-aged white woman in no acute distress Filed Vitals:   11/13/14 1114  BP: 117/70  Pulse: 84  Temp: 97.9 F (36.6 C)  Resp: 18     Body mass index is 27.61 kg/(m^2).    ECOG FS:1 - Symptomatic but completely ambulatory   Skin: warm, dry  HEENT: sclerae anicteric, conjunctivae pink, oropharynx clear. No thrush or mucositis.  Lymph Nodes: No cervical or supraclavicular lymphadenopathy  Lungs: clear to auscultation bilaterally, no rales, wheezes, or rhonci  Heart: regular rate and rhythm  Abdomen: round, soft, non tender, positive bowel sounds  Musculoskeletal: No focal spinal tenderness, no peripheral edema  Neuro: non focal, well oriented, positive affect  Breasts: deferred  LAB RESULTS:  CMP     Component Value Date/Time   NA 143 11/13/2014 1054   NA 141 10/01/2014 0720   K 4.3 11/13/2014 1054   K 3.1* 10/01/2014 0720   CL 108 10/01/2014 0720   CO2 20* 11/13/2014 1054   CO2 25 10/01/2014 0720   GLUCOSE 84 11/13/2014 1054   GLUCOSE 81 10/01/2014 0720   BUN 9.7 11/13/2014 1054   BUN 7 10/01/2014 0720   CREATININE 1.2* 11/13/2014 1054   CREATININE 0.84 10/01/2014 0720   CALCIUM 8.9 11/13/2014 1054   CALCIUM 8.7 10/01/2014 0720   PROT 6.3* 11/13/2014 1054   PROT 6.1 09/28/2014 2024   ALBUMIN 3.8 11/13/2014  1054   ALBUMIN 3.0* 09/28/2014 2024   AST 15 11/13/2014 1054   AST 14 09/28/2014 2024   ALT 13 11/13/2014 1054   ALT 10 09/28/2014 2024   ALKPHOS 94 11/13/2014 1054   ALKPHOS 80 09/28/2014 2024   BILITOT 0.33 11/13/2014 1054   BILITOT 0.6 09/28/2014 2024   GFRNONAA 78* 10/01/2014 0720   GFRAA >90 10/01/2014 0720    I No results found for: SPEP  Lab Results  Component Value Date   WBC 3.6* 11/13/2014   NEUTROABS 2.3 11/13/2014   HGB 10.6* 11/13/2014   HCT 31.3* 11/13/2014   MCV 94.0 11/13/2014   PLT 160 11/13/2014      Chemistry      Component Value Date/Time   NA 143 11/13/2014 1054   NA 141 10/01/2014 0720   K 4.3 11/13/2014 1054   K 3.1* 10/01/2014 0720   CL 108 10/01/2014 0720   CO2 20* 11/13/2014 1054   CO2 25 10/01/2014 0720   BUN 9.7 11/13/2014 1054   BUN 7 10/01/2014 0720   CREATININE 1.2* 11/13/2014 1054   CREATININE 0.84 10/01/2014 0720      Component Value Date/Time   CALCIUM 8.9 11/13/2014 1054   CALCIUM 8.7 10/01/2014 0720   ALKPHOS 94 11/13/2014 1054   ALKPHOS 80 09/28/2014 2024   AST 15 11/13/2014 1054   AST 14 09/28/2014 2024   ALT 13 11/13/2014 1054   ALT 10 09/28/2014 2024   BILITOT 0.33 11/13/2014 1054   BILITOT 0.6 09/28/2014 2024       No results found for: LABCA2  No components found for: IRWER154  No results for input(s): INR in the last 168 hours.  Urinalysis    Component Value  Date/Time   COLORURINE YELLOW 09/29/2014 0119   APPEARANCEUR CLEAR 09/29/2014 0119   LABSPEC 1.010 09/29/2014 0119   LABSPEC 1.010 07/10/2014 1004   PHURINE 5.5 09/29/2014 0119   GLUCOSEU 100* 09/29/2014 0119   GLUCOSEU Negative 07/10/2014 1004   HGBUR MODERATE* 09/29/2014 0119   BILIRUBINUR NEGATIVE 09/29/2014 0119   KETONESUR NEGATIVE 09/29/2014 0119   PROTEINUR NEGATIVE 09/29/2014 0119   UROBILINOGEN 1.0 09/29/2014 0119   UROBILINOGEN 0.2 07/10/2014 1004   NITRITE NEGATIVE 09/29/2014 0119   NITRITE Negative 07/10/2014 1004   LEUKOCYTESUR  NEGATIVE 09/29/2014 0119    STUDIES: Most recent echocardiogram on 10/29/14 showed an EF of 50-55%  ASSESSMENT: 54 y.o. BRCA negative [place] woman status post left breast biopsy 02/22/2014 for a clinical T2/T3 NX, stage II or III invasive ductal carcinoma, grade 3, estrogen and progesterone receptor negative, with an MIB-1 of 83%, and HER-2 amplified with a signals a ratio of 2.07and a copy number per cell of 4.75  (1) biopsy of an additional area of enhancement in the left breast 03/08/2014 showed ductal carcinoma in situ, estrogen and progesterone receptor negative.  (2) status post bilateral mastectomies with left sentinel lymph node sampling 04/02/2014, showing:  (a) on the right, benign breast tissue including a single negative lymph node  (b) On  the left, a  pT1c pN0, stage IA invasive ductal carcinoma, grade 3, with negative margins  (3) adjuvant chemotherapy started 05/08/2014, consisting of carboplatin and docetaxel given every 3 weeks x6, together with trastuzumab, with neulasta day 2   (a) docetaxel removed from final cycle 08/21/2014 because of persistent neuropathy symptoms  (4) trastuzumab to be continued to complete a year (through OCT 2016) Most recent echocardiogram 03/21/2014 showed a normal ejection fraction  (a) echo 10/29/14 showed a well-preserved ejection fraction  (5) reconstruction with bilateral silicone implants scheduled for March 2016 ([place])  (6) Hx of smoking: The patient quit smoking 03/30/2014  (7) 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 The labs were reviewed in detail and were entirely stable. Her most recent echocardiogram shows a well preserved ejection fraction. She will proceed with her next dose of trastuzumab as planned today.   Stacy Fernandez plans to have her final reconstructive surgery in late May, and she asks that a letter of her current restrictions be written for work so  I have done so today and given this to her. She will restart the 355m gabapentin BID for her neuropathy symptoms.   LJeani Hawkingwill continue trastuzumab every 3 weeks. Her next office visit will be in late June with Dr. MJana Hakim She understands and agrees with this plan. She knows the goal of treatment in her case is cure. She has been encouraged to call with any issues that might arise before her next visit here.   HLaurie Panda NP   11/13/2014 11:41 AM

## 2014-11-13 NOTE — Patient Instructions (Signed)
[facility] Discharge Instructions for Patients Receiving Chemotherapy  Today you received the following chemotherapy agents:  Herceptin  To help prevent nausea and vomiting after your treatment, we encourage you to take your nausea medication as prescribed.   If you develop nausea and vomiting that is not controlled by your nausea medication, call the clinic.   BELOW ARE SYMPTOMS THAT SHOULD BE REPORTED IMMEDIATELY:  *FEVER GREATER THAN 100.5 F  *CHILLS WITH OR WITHOUT FEVER  NAUSEA AND VOMITING THAT IS NOT CONTROLLED WITH YOUR NAUSEA MEDICATION  *UNUSUAL SHORTNESS OF BREATH  *UNUSUAL BRUISING OR BLEEDING  TENDERNESS IN MOUTH AND THROAT WITH OR WITHOUT PRESENCE OF ULCERS  *URINARY PROBLEMS  *BOWEL PROBLEMS  UNUSUAL RASH Items with * indicate a potential emergency and should be followed up as soon as possible.  Feel free to call the clinic you have any questions or concerns. The clinic phone number is [phone].  Please show the CHEMO ALERT CARD at check-in to the Emergency Department and triage nurse.   

## 2014-11-22 ENCOUNTER — Telehealth: Payer: Self-pay | Admitting: Neurology

## 2014-11-22 ENCOUNTER — Other Ambulatory Visit: Payer: Self-pay | Admitting: Neurology

## 2014-11-22 NOTE — Telephone Encounter (Signed)
Our records show we have always sent the Rx for tablets.  I called the pharmacy.  Although we previously prescribed tablets, at some point they were not available, so they dispensed capsules instead.  They indicate the strength remained the same.  I called the patient back.  After relaying info provided by pharmacy, she does recall taking tablets, then them changing her to capsules.  She is okay with taking tablets and said nothing further is needed at this time.

## 2014-11-22 NOTE — Telephone Encounter (Signed)
Patient called stating her script for verapamil (CALAN-SR) 120 MG CR tablet was sent as tablets instead of capsules. Patient was not aware of this change. Please call and advice # [phone]

## 2014-11-26 ENCOUNTER — Other Ambulatory Visit: Payer: Self-pay | Admitting: Nurse Practitioner

## 2014-11-29 ENCOUNTER — Telehealth: Payer: Self-pay | Admitting: Pulmonary Disease

## 2014-11-29 NOTE — Telephone Encounter (Signed)
OK to add on 5/24  10-30 am

## 2014-11-29 NOTE — Telephone Encounter (Signed)
Called and spoke to pt. Informed her of the appt time and date. Pt verbalized understanding and denied any further questions or concerns at this time.

## 2014-11-29 NOTE — Telephone Encounter (Signed)
Spoke with pt. States that she is scheduled for surgery on 12/25/14. Needs to see RA before then. There aren't any openings before this date. Wants to know if she can be worked in.  RA - please advise. Thanks.

## 2014-12-04 ENCOUNTER — Other Ambulatory Visit (HOSPITAL_BASED_OUTPATIENT_CLINIC_OR_DEPARTMENT_OTHER): Payer: BLUE CROSS/BLUE SHIELD

## 2014-12-04 ENCOUNTER — Encounter: Payer: Self-pay | Admitting: Oncology

## 2014-12-04 ENCOUNTER — Ambulatory Visit (HOSPITAL_BASED_OUTPATIENT_CLINIC_OR_DEPARTMENT_OTHER): Payer: BLUE CROSS/BLUE SHIELD

## 2014-12-04 VITALS — BP 97/63 | HR 60 | Temp 97.5°F | Resp 18

## 2014-12-04 DIAGNOSIS — C50812 Malignant neoplasm of overlapping sites of left female breast: Secondary | ICD-10-CM

## 2014-12-04 DIAGNOSIS — Z5112 Encounter for antineoplastic immunotherapy: Secondary | ICD-10-CM | POA: Diagnosis not present

## 2014-12-04 DIAGNOSIS — C50412 Malignant neoplasm of upper-outer quadrant of left female breast: Secondary | ICD-10-CM

## 2014-12-04 DIAGNOSIS — C50419 Malignant neoplasm of upper-outer quadrant of unspecified female breast: Secondary | ICD-10-CM

## 2014-12-04 LAB — COMPREHENSIVE METABOLIC PANEL (CC13)
ALBUMIN: 3.9 g/dL (ref 3.5–5.0)
ALT: 16 U/L (ref 0–55)
ANION GAP: 10 meq/L (ref 3–11)
AST: 18 U/L (ref 5–34)
Alkaline Phosphatase: 109 U/L (ref 40–150)
BUN: 16.3 mg/dL (ref 7.0–26.0)
CALCIUM: 9.1 mg/dL (ref 8.4–10.4)
CO2: 23 meq/L (ref 22–29)
Chloride: 110 mEq/L — ABNORMAL HIGH (ref 98–109)
Creatinine: 1.2 mg/dL — ABNORMAL HIGH (ref 0.6–1.1)
EGFR: 49 mL/min/{1.73_m2} — AB (ref >90)
GLUCOSE: 91 mg/dL (ref 70–140)
POTASSIUM: 4.4 meq/L (ref 3.5–5.1)
Sodium: 143 mEq/L (ref 136–145)
TOTAL PROTEIN: 6.7 g/dL (ref 6.4–8.3)
Total Bilirubin: 0.4 mg/dL (ref 0.20–1.20)

## 2014-12-04 LAB — CBC WITH DIFFERENTIAL/PLATELET
BASO%: 0.7 % (ref 0.0–2.0)
Basophils Absolute: 0 10*3/uL (ref 0.0–0.1)
EOS%: 1.6 % (ref 0.0–7.0)
Eosinophils Absolute: 0.1 10*3/uL (ref 0.0–0.5)
HCT: 33.5 % — ABNORMAL LOW (ref 34.8–46.6)
HGB: 11.2 g/dL — ABNORMAL LOW (ref 11.6–15.9)
LYMPH#: 0.8 10*3/uL — AB (ref 0.9–3.3)
LYMPH%: 27.5 % (ref 14.0–49.7)
MCH: 31 pg (ref 25.1–34.0)
MCHC: 33.4 g/dL (ref 31.5–36.0)
MCV: 92.8 fL (ref 79.5–101.0)
MONO#: 0.2 10*3/uL (ref 0.1–0.9)
MONO%: 6.9 % (ref 0.0–14.0)
NEUT#: 1.9 10*3/uL (ref 1.5–6.5)
NEUT%: 63.3 % (ref 38.4–76.8)
Platelets: 185 10*3/uL (ref 145–400)
RBC: 3.61 10*6/uL — ABNORMAL LOW (ref 3.70–5.45)
RDW: 12.8 % (ref 11.2–14.5)
WBC: 3.1 10*3/uL — AB (ref 3.9–10.3)

## 2014-12-04 MED ORDER — DIPHENHYDRAMINE HCL 25 MG PO CAPS
ORAL_CAPSULE | ORAL | Status: AC
  Filled 2014-12-04: qty 1

## 2014-12-04 MED ORDER — SODIUM CHLORIDE 0.9 % IJ SOLN
10.0000 mL | INTRAMUSCULAR | Status: DC | PRN
  Administered 2014-12-04: 10 mL
  Filled 2014-12-04: qty 10

## 2014-12-04 MED ORDER — ACETAMINOPHEN 325 MG PO TABS
ORAL_TABLET | ORAL | Status: AC
Start: 2014-12-04 — End: 2014-12-04
  Filled 2014-12-04: qty 2

## 2014-12-04 MED ORDER — DIPHENHYDRAMINE HCL 25 MG PO CAPS
25.0000 mg | ORAL_CAPSULE | Freq: Once | ORAL | Status: AC
  Administered 2014-12-04: 25 mg via ORAL

## 2014-12-04 MED ORDER — SODIUM CHLORIDE 0.9 % IV SOLN
6.0000 mg/kg | Freq: Once | INTRAVENOUS | Status: AC
  Administered 2014-12-04: 462 mg via INTRAVENOUS
  Filled 2014-12-04: qty 22

## 2014-12-04 MED ORDER — SODIUM CHLORIDE 0.9 % IV SOLN
Freq: Once | INTRAVENOUS | Status: AC
  Administered 2014-12-04: 10:00:00 via INTRAVENOUS

## 2014-12-04 MED ORDER — HEPARIN SOD (PORK) LOCK FLUSH 100 UNIT/ML IV SOLN
500.0000 [IU] | Freq: Once | INTRAVENOUS | Status: AC | PRN
  Administered 2014-12-04: 500 [IU]
  Filled 2014-12-04: qty 5

## 2014-12-04 MED ORDER — ACETAMINOPHEN 325 MG PO TABS
650.0000 mg | ORAL_TABLET | Freq: Once | ORAL | Status: AC
  Administered 2014-12-04: 650 mg via ORAL

## 2014-12-04 NOTE — Patient Instructions (Signed)
[facility] Discharge Instructions for Patients Receiving Chemotherapy  Today you received the following chemotherapy agents Herceptin  To help prevent nausea and vomiting after your treatment, we encourage you to take your nausea medication    If you develop nausea and vomiting that is not controlled by your nausea medication, call the clinic.   BELOW ARE SYMPTOMS THAT SHOULD BE REPORTED IMMEDIATELY:  *FEVER GREATER THAN 100.5 F  *CHILLS WITH OR WITHOUT FEVER  NAUSEA AND VOMITING THAT IS NOT CONTROLLED WITH YOUR NAUSEA MEDICATION  *UNUSUAL SHORTNESS OF BREATH  *UNUSUAL BRUISING OR BLEEDING  TENDERNESS IN MOUTH AND THROAT WITH OR WITHOUT PRESENCE OF ULCERS  *URINARY PROBLEMS  *BOWEL PROBLEMS  UNUSUAL RASH Items with * indicate a potential emergency and should be followed up as soon as possible.  Feel free to call the clinic you have any questions or concerns. The clinic phone number is [phone].  Please show the CHEMO ALERT CARD at check-in to the Emergency Department and triage nurse.   

## 2014-12-04 NOTE — Progress Notes (Signed)
I placed [employer] forms on the desk of nurse for dr. Jana Hakim

## 2014-12-07 ENCOUNTER — Encounter: Payer: Self-pay | Admitting: Oncology

## 2014-12-07 NOTE — Progress Notes (Signed)
I mailed copy of forms to patient to have for her records.

## 2014-12-07 NOTE — Progress Notes (Signed)
I faxed [employer] forms and notes  [phone]

## 2014-12-10 NOTE — H&P (Signed)
  Subjective:    Patient ID: Stacy Fernandez is a 54 y.o. female.  HPI  2.5 months post op bilateral TE/ADM reconstruction. Medical leave extended to end of July. Is walking 1-3 miles per day. Seeing pulmonologist tomorrow.   Patient presented with pain and dimpling in the lateral aspect of the left breast . MMG and US demonstrate a 1.1 cm mass in the left breast laterally. Biopsy showed grade 3 invasive ductal carcinoma, HER-2 +, ER/PR -. MRI showed a 1.8 cm mass in the left breast at 3:00 position and a 4.5 cm area of additional linear enhancement without mass extending anteriorly from the primary cancer, suspicious for DCIS.Patient has undergone bilateral mastectomies, she elected for prophylactic on right side, no LN sampling on this side. Final pathology on the right, benign breast tissue including a single negative lymph node; on the left, a pT1c pN0, stage IA invasive ductal carcinoma, grade 3. She will continue on herceptin through 04/2015.  38 C prior, genetic panel negative. Quit smoking 03/2014  Hx brain aneurysm found on CT/MRI for frequent HA. Treatment has been observation with yearly scans. Has chronic migraine treated with Botox.   Dr. Elsworth Soho is her pulmonologist. Dr. Floyde Parkins is her neurologist.   Review of Systems     Objective:   Physical Exam  Cardiovascular: Regular rhythm and normal heart sounds.  Pulmonary/Chest: Effort normal and breath sounds normal. She has no wheezes.   Chest soft,   R chest port BW R 14 L 14 cm Depression contour lateral breast and superior poles Abdomen soft, transverse scar , no hernias  Assessment:     Left breast cancer S/p bilateral mastectomies, adjuvant chemotherapy S/p bilateral TE/ADM reconstruction     Plan:   Patient has elected for round silicone implants. Adjuvant fat grafting to improve contour. Reviewed risks contracture, rupture, rippling, variable take fat grafting and may need to repeat this, abdominal  pain and bruising. States friends of family have told her she will die if silicone rupture and gets into her body. Counseled this is not try. Provided silicone implant info booklet. Reviewed both saline and silicone have similar rates of rupture, however silicone may have no signs if ruptured and may benefit from MRI to check for rupture in future. Discussed rippling and firmer feel may be increased with saline implants. New pictures taken.  Review of labs with persistent WBC 3.1 (though improved from a month ago) and hb 11.2 Plan at least overnight stay, drains. Discussed variable take of fat grafting, liposuction incisions, may need to repeat procedure, pain, bruising and compression abdomen.  Desires to return to full C.   Mentor Artoura High Profile 375 ml tissue expanders placed bilaterally. Right fill volume 420 ml  Left fill volume 420 ml        [name], MD [facility] [phone]

## 2014-12-11 ENCOUNTER — Encounter: Payer: Self-pay | Admitting: Pulmonary Disease

## 2014-12-11 ENCOUNTER — Ambulatory Visit (INDEPENDENT_AMBULATORY_CARE_PROVIDER_SITE_OTHER): Payer: BLUE CROSS/BLUE SHIELD | Admitting: Pulmonary Disease

## 2014-12-11 VITALS — BP 110/78 | HR 81 | Ht 66.0 in | Wt 175.8 lb

## 2014-12-11 DIAGNOSIS — R911 Solitary pulmonary nodule: Secondary | ICD-10-CM | POA: Diagnosis not present

## 2014-12-11 DIAGNOSIS — J438 Other emphysema: Secondary | ICD-10-CM

## 2014-12-11 NOTE — Patient Instructions (Signed)
You are cleared for surgery COngratulations on quitting smoking !! Duonebs every 6h as needed while in hospital post op Let us know once PET scan done so I can review Call as needed

## 2014-12-11 NOTE — Assessment & Plan Note (Signed)
This was not noted on follow-up scan in 02/2014. We'll review when she gets follow-up imaging post Herceptin therapy

## 2014-12-11 NOTE — Progress Notes (Signed)
Subjective:    Patient ID: Stacy Fernandez, female    DOB: 1961/05/29, 54 y.o.   MRN: [id number]  HPI  PCP- Stacy Fernandez   54 year old heavy ex-smoker with COPD referred for preop evaluation. Her sister was Stacy Fernandez She smoked more than 30-pack-years and quit in 03/2014. She was diagnosed with breast cancer, underwent bilateral mastectomy and is now scheduled for breast reconstruction. She underwent chemotherapy, and is now receiving Herceptin.  She has a remote history of Nissen's fundoplication in 54 but denies obvious heartburn Spirometry shows ratio 65, FEV1 61%, FVC 75% She is able to walk around in the store and climb 1 flight of stairs she denies wheezing or frequent chest colds. Her 54 first surgery was complicated by admission for pneumonia soon afterwards. She only uses broncho-dilator nebs as needed-last was 2 weeks ago.  Significant tests/ events  12/2012, cardiac cath [facility] essentially normal coronary arteries, LVEF was estimated at 60%, troponins were negative.  Of note, she has an allergy listed as anaphylaxis to contrast dye and was premedicated prior to the procedure.    cardiopulmonary stress testing in 12/13/12 showed mildly reduced functional status with O2 of 76% predicted.  Spirometry  11/2012 -moderate airway obstruction with FEV1 of 58% FVC of 71% and ratio 65, diffusion was 38%   12/2012 Admitted for chest pain, intermed prob VQ scan, CT angio neg (after premedication)-severe centrilobular and paraseptal emphysema noted, Indeterminate punctate (<3 mm) nodules within the trachea and proximal aspect of the right main stem bronchus  12/2012 bronchoscopy >> Nodular appearing white sputum noted in the right main stem which washed away with saline. Similar tag noted in the posterior wall of trachea which was brushed >> neg  02/2014 PET scan -no evidence of lymph node, chest metastases  12/11/2014  Chief Complaint  Patient presents with  . Follow-up   Patient doing well.  Needs Surgical clearance for breast reconstruction.  CAT: 14   Past Medical History  Diagnosis Date  . Hypothyroidism   . Shortness of breath     2D ECHO, 04/20/2012 - EF >55%, normal; EXERCISE STRESS TEST, 04/20/2012 - normal, EKG negative for ischemia, no ECG changes  . COPD (chronic obstructive pulmonary disease)   . Emphysema   . Hematuria     cause unknow- saw Urologist  . Hot flashes   . Anginal pain     Dr Stacy Fernandez cardiologist- test normal.  " think it is from breast cancer"  . Brain aneurysm      Dr Stacy Fernandez neurologist is monitoring  . Anxiety     Due to situation, CA breast  . Chronic bronchitis     "I've had it quite a few times" (04/02/2014)  . GERD (gastroesophageal reflux disease)   . Migraine     "treated w/botox for them" (04/02/2014)  . Wears glasses   . Wears dentures     top     Review of Systems neg for any significant sore throat, dysphagia, itching, sneezing, nasal congestion or excess/ purulent secretions, fever, chills, sweats, unintended wt loss, pleuritic or exertional cp, hempoptysis, orthopnea pnd or change in chronic leg swelling. Also denies presyncope, palpitations, heartburn, abdominal pain, nausea, vomiting, diarrhea or change in bowel or urinary habits, dysuria,hematuria, rash, arthralgias, visual complaints, headache, numbness weakness or ataxia.     Objective:   Physical Exam  Gen. Pleasant, well-nourished, in no distress, normal affect ENT - no lesions, no post nasal drip Neck: No JVD, no thyromegaly, no carotid bruits Lungs:  no use of accessory muscles, no dullness to percussion, clear without rales or rhonchi  Cardiovascular: Rhythm regular, heart sounds  normal, no murmurs or gallops, no peripheral edema Abdomen: soft and non-tender, no hepatosplenomegaly, BS normal. Musculoskeletal: No deformities, no cyanosis or clubbing Neuro:  alert, non focal        Assessment & Plan:

## 2014-12-11 NOTE — Assessment & Plan Note (Signed)
You are cleared for surgery COngratulations on quitting smoking !! Duonebs every 6h as needed while in hospital post op Call as needed

## 2014-12-12 ENCOUNTER — Other Ambulatory Visit: Payer: Self-pay | Admitting: Neurology

## 2014-12-13 ENCOUNTER — Encounter (HOSPITAL_BASED_OUTPATIENT_CLINIC_OR_DEPARTMENT_OTHER)
Admission: RE | Admit: 2014-12-13 | Discharge: 2014-12-13 | Disposition: A | Discharge status: Home / Self Care | Payer: BLUE CROSS/BLUE SHIELD | Source: Ambulatory Visit | Attending: Plastic Surgery | Admitting: Plastic Surgery

## 2014-12-13 ENCOUNTER — Encounter (HOSPITAL_BASED_OUTPATIENT_CLINIC_OR_DEPARTMENT_OTHER): Payer: Self-pay | Admitting: *Deleted

## 2014-12-13 DIAGNOSIS — E039 Hypothyroidism, unspecified: Secondary | ICD-10-CM | POA: Diagnosis not present

## 2014-12-13 DIAGNOSIS — G43909 Migraine, unspecified, not intractable, without status migrainosus: Secondary | ICD-10-CM | POA: Diagnosis not present

## 2014-12-13 DIAGNOSIS — Z9013 Acquired absence of bilateral breasts and nipples: Secondary | ICD-10-CM | POA: Insufficient documentation

## 2014-12-13 DIAGNOSIS — Z87891 Personal history of nicotine dependence: Secondary | ICD-10-CM | POA: Diagnosis not present

## 2014-12-13 DIAGNOSIS — Z9861 Coronary angioplasty status: Secondary | ICD-10-CM | POA: Diagnosis not present

## 2014-12-13 DIAGNOSIS — I739 Peripheral vascular disease, unspecified: Secondary | ICD-10-CM | POA: Diagnosis not present

## 2014-12-13 DIAGNOSIS — Z888 Allergy status to other drugs, medicaments and biological substances status: Secondary | ICD-10-CM | POA: Diagnosis not present

## 2014-12-13 DIAGNOSIS — Z853 Personal history of malignant neoplasm of breast: Secondary | ICD-10-CM | POA: Insufficient documentation

## 2014-12-13 DIAGNOSIS — K219 Gastro-esophageal reflux disease without esophagitis: Secondary | ICD-10-CM | POA: Diagnosis not present

## 2014-12-13 DIAGNOSIS — Z421 Encounter for breast reconstruction following mastectomy: Secondary | ICD-10-CM | POA: Diagnosis not present

## 2014-12-13 DIAGNOSIS — Z9049 Acquired absence of other specified parts of digestive tract: Secondary | ICD-10-CM | POA: Diagnosis not present

## 2014-12-13 DIAGNOSIS — Z01818 Encounter for other preprocedural examination: Secondary | ICD-10-CM | POA: Diagnosis not present

## 2014-12-13 DIAGNOSIS — Z79899 Other long term (current) drug therapy: Secondary | ICD-10-CM | POA: Diagnosis not present

## 2014-12-13 DIAGNOSIS — Z91041 Radiographic dye allergy status: Secondary | ICD-10-CM | POA: Diagnosis not present

## 2014-12-13 DIAGNOSIS — J449 Chronic obstructive pulmonary disease, unspecified: Secondary | ICD-10-CM | POA: Diagnosis not present

## 2014-12-13 DIAGNOSIS — I25119 Atherosclerotic heart disease of native coronary artery with unspecified angina pectoris: Secondary | ICD-10-CM | POA: Diagnosis not present

## 2014-12-13 LAB — CBC WITH DIFFERENTIAL/PLATELET
Basophils Absolute: 0 10*3/uL (ref 0.0–0.1)
Basophils Relative: 0 % (ref 0–1)
Eosinophils Absolute: 0.1 10*3/uL (ref 0.0–0.7)
Eosinophils Relative: 2 % (ref 0–5)
HCT: 31.3 % — ABNORMAL LOW (ref 36.0–46.0)
Hemoglobin: 10.5 g/dL — ABNORMAL LOW (ref 12.0–15.0)
LYMPHS ABS: 0.9 10*3/uL (ref 0.7–4.0)
Lymphocytes Relative: 30 % (ref 12–46)
MCH: 30.5 pg (ref 26.0–34.0)
MCHC: 33.5 g/dL (ref 30.0–36.0)
MCV: 91 fL (ref 78.0–100.0)
MONO ABS: 0.2 10*3/uL (ref 0.1–1.0)
Monocytes Relative: 8 % (ref 3–12)
Neutro Abs: 1.7 10*3/uL (ref 1.7–7.7)
Neutrophils Relative %: 60 % (ref 43–77)
Platelets: 191 10*3/uL (ref 150–400)
RBC: 3.44 MIL/uL — ABNORMAL LOW (ref 3.87–5.11)
RDW: 13 % (ref 11.5–15.5)
WBC: 2.8 10*3/uL — ABNORMAL LOW (ref 4.0–10.5)

## 2014-12-14 ENCOUNTER — Other Ambulatory Visit: Payer: Self-pay | Admitting: Oncology

## 2014-12-18 ENCOUNTER — Ambulatory Visit (HOSPITAL_BASED_OUTPATIENT_CLINIC_OR_DEPARTMENT_OTHER): Payer: BLUE CROSS/BLUE SHIELD | Admitting: Anesthesiology

## 2014-12-18 ENCOUNTER — Encounter (HOSPITAL_BASED_OUTPATIENT_CLINIC_OR_DEPARTMENT_OTHER): Payer: Self-pay | Admitting: Anesthesiology

## 2014-12-18 ENCOUNTER — Ambulatory Visit (HOSPITAL_BASED_OUTPATIENT_CLINIC_OR_DEPARTMENT_OTHER)
Admission: RE | Admit: 2014-12-18 | Discharge: 2014-12-19 | Disposition: A | Discharge status: Home / Self Care | Payer: BLUE CROSS/BLUE SHIELD | Source: Ambulatory Visit | Attending: Plastic Surgery | Admitting: Plastic Surgery

## 2014-12-18 ENCOUNTER — Encounter (HOSPITAL_BASED_OUTPATIENT_CLINIC_OR_DEPARTMENT_OTHER)
Admission: RE | Disposition: A | Discharge status: Home / Self Care | Payer: Self-pay | Source: Ambulatory Visit | Attending: Plastic Surgery

## 2014-12-18 DIAGNOSIS — E039 Hypothyroidism, unspecified: Secondary | ICD-10-CM | POA: Insufficient documentation

## 2014-12-18 DIAGNOSIS — K219 Gastro-esophageal reflux disease without esophagitis: Secondary | ICD-10-CM | POA: Insufficient documentation

## 2014-12-18 DIAGNOSIS — Z9861 Coronary angioplasty status: Secondary | ICD-10-CM | POA: Insufficient documentation

## 2014-12-18 DIAGNOSIS — Z9049 Acquired absence of other specified parts of digestive tract: Secondary | ICD-10-CM | POA: Insufficient documentation

## 2014-12-18 DIAGNOSIS — Z91041 Radiographic dye allergy status: Secondary | ICD-10-CM | POA: Insufficient documentation

## 2014-12-18 DIAGNOSIS — Z87891 Personal history of nicotine dependence: Secondary | ICD-10-CM | POA: Insufficient documentation

## 2014-12-18 DIAGNOSIS — Z853 Personal history of malignant neoplasm of breast: Secondary | ICD-10-CM | POA: Insufficient documentation

## 2014-12-18 DIAGNOSIS — Z888 Allergy status to other drugs, medicaments and biological substances status: Secondary | ICD-10-CM | POA: Insufficient documentation

## 2014-12-18 DIAGNOSIS — Z421 Encounter for breast reconstruction following mastectomy: Secondary | ICD-10-CM | POA: Diagnosis not present

## 2014-12-18 DIAGNOSIS — Z79899 Other long term (current) drug therapy: Secondary | ICD-10-CM | POA: Insufficient documentation

## 2014-12-18 DIAGNOSIS — Z9013 Acquired absence of bilateral breasts and nipples: Secondary | ICD-10-CM | POA: Insufficient documentation

## 2014-12-18 DIAGNOSIS — G43909 Migraine, unspecified, not intractable, without status migrainosus: Secondary | ICD-10-CM | POA: Insufficient documentation

## 2014-12-18 DIAGNOSIS — I739 Peripheral vascular disease, unspecified: Secondary | ICD-10-CM | POA: Insufficient documentation

## 2014-12-18 DIAGNOSIS — I25119 Atherosclerotic heart disease of native coronary artery with unspecified angina pectoris: Secondary | ICD-10-CM | POA: Insufficient documentation

## 2014-12-18 DIAGNOSIS — J449 Chronic obstructive pulmonary disease, unspecified: Secondary | ICD-10-CM | POA: Insufficient documentation

## 2014-12-18 DIAGNOSIS — Z9010 Acquired absence of unspecified breast and nipple: Secondary | ICD-10-CM

## 2014-12-18 HISTORY — PX: REMOVAL OF BILATERAL TISSUE EXPANDERS WITH PLACEMENT OF BILATERAL BREAST IMPLANTS: SHX6431

## 2014-12-18 HISTORY — DX: Cerebral aneurysm, nonruptured: I67.1

## 2014-12-18 HISTORY — DX: Personal history of other diseases of the digestive system: Z87.19

## 2014-12-18 HISTORY — PX: LIPOSUCTION WITH LIPOFILLING: SHX6436

## 2014-12-18 HISTORY — DX: Personal history of antineoplastic chemotherapy: Z92.21

## 2014-12-18 SURGERY — REMOVAL, TISSUE EXPANDER, BREAST, BILATERAL, WITH BILATERAL IMPLANT IMPLANT INSERTION
Anesthesia: General | Site: Chest | Laterality: Bilateral

## 2014-12-18 MED ORDER — LIDOCAINE HCL 1 % IJ SOLN
INTRAVENOUS | Status: DC | PRN
  Administered 2014-12-18: 400 mL via INTRAMUSCULAR

## 2014-12-18 MED ORDER — ONDANSETRON HCL 4 MG/2ML IJ SOLN
INTRAMUSCULAR | Status: DC | PRN
  Administered 2014-12-18: 4 mg via INTRAVENOUS

## 2014-12-18 MED ORDER — GLYCOPYRROLATE 0.2 MG/ML IJ SOLN
0.2000 mg | Freq: Once | INTRAMUSCULAR | Status: AC | PRN
  Administered 2014-12-18: 0.4 mg via INTRAVENOUS

## 2014-12-18 MED ORDER — NEOSTIGMINE METHYLSULFATE 10 MG/10ML IV SOLN
INTRAVENOUS | Status: DC | PRN
  Administered 2014-12-18: 2.5 mg via INTRAVENOUS

## 2014-12-18 MED ORDER — LIDOCAINE HCL (CARDIAC) 20 MG/ML IV SOLN
INTRAVENOUS | Status: DC | PRN
Start: 2014-12-18 — End: 2014-12-18
  Administered 2014-12-18: 50 mg via INTRAVENOUS

## 2014-12-18 MED ORDER — HYDROMORPHONE HCL 1 MG/ML IJ SOLN
0.2500 mg | INTRAMUSCULAR | Status: DC | PRN
  Administered 2014-12-18 (×3): 0.5 mg via INTRAVENOUS

## 2014-12-18 MED ORDER — RIZATRIPTAN BENZOATE 5 MG PO TBDP: 5.0000 mg | ORAL_TABLET | Freq: Once | ORAL | Status: AC | PRN

## 2014-12-18 MED ORDER — CEFAZOLIN SODIUM-DEXTROSE 2-3 GM-% IV SOLR
2.0000 g | INTRAVENOUS | Status: AC
  Administered 2014-12-18: 2 g via INTRAVENOUS

## 2014-12-18 MED ORDER — EPHEDRINE SULFATE 50 MG/ML IJ SOLN
INTRAMUSCULAR | Status: DC | PRN
  Administered 2014-12-18 (×2): 10 mg via INTRAVENOUS

## 2014-12-18 MED ORDER — CHLORHEXIDINE GLUCONATE 4 % EX LIQD: 1.0000 "application " | Freq: Once | CUTANEOUS | Status: DC

## 2014-12-18 MED ORDER — MONTELUKAST SODIUM 10 MG PO TABS
10.0000 mg | ORAL_TABLET | Freq: Every day | ORAL | Status: DC
  Administered 2014-12-18: 10 mg via ORAL

## 2014-12-18 MED ORDER — GABAPENTIN 300 MG PO CAPS
300.0000 mg | ORAL_CAPSULE | Freq: Two times a day (BID) | ORAL | Status: DC
  Administered 2014-12-18: 300 mg via ORAL

## 2014-12-18 MED ORDER — MIDAZOLAM HCL 2 MG/2ML IJ SOLN
1.0000 mg | INTRAMUSCULAR | Status: DC | PRN
  Administered 2014-12-18: 2 mg via INTRAVENOUS

## 2014-12-18 MED ORDER — OXYCODONE HCL 5 MG PO TABS: 5.0000 mg | ORAL_TABLET | ORAL | Status: DC | PRN

## 2014-12-18 MED ORDER — PROPOFOL 10 MG/ML IV BOLUS
INTRAVENOUS | Status: DC | PRN
  Administered 2014-12-18: 100 mg via INTRAVENOUS

## 2014-12-18 MED ORDER — KETOROLAC TROMETHAMINE 30 MG/ML IJ SOLN
30.0000 mg | Freq: Three times a day (TID) | INTRAMUSCULAR | Status: DC
  Administered 2014-12-18 – 2014-12-19 (×2): 30 mg via INTRAVENOUS

## 2014-12-18 MED ORDER — ONDANSETRON HCL 4 MG PO TABS: 4.0000 mg | ORAL_TABLET | Freq: Four times a day (QID) | ORAL | Status: DC | PRN

## 2014-12-18 MED ORDER — SUCCINYLCHOLINE CHLORIDE 20 MG/ML IJ SOLN
INTRAMUSCULAR | Status: DC | PRN
  Administered 2014-12-18: 60 mg via INTRAVENOUS

## 2014-12-18 MED ORDER — OXYCODONE HCL 5 MG PO TABS
5.0000 mg | ORAL_TABLET | ORAL | Status: DC | PRN
  Administered 2014-12-18 – 2014-12-19 (×3): 10 mg via ORAL
  Filled 2014-12-18 (×3): qty 2

## 2014-12-18 MED ORDER — FENTANYL CITRATE (PF) 100 MCG/2ML IJ SOLN: 50.0000 ug | INTRAMUSCULAR | Status: DC | PRN

## 2014-12-18 MED ORDER — FUROSEMIDE 20 MG PO TABS: 10.0000 mg | ORAL_TABLET | Freq: Every day | ORAL | Status: DC

## 2014-12-18 MED ORDER — ROCURONIUM BROMIDE 100 MG/10ML IV SOLN
INTRAVENOUS | Status: DC | PRN
  Administered 2014-12-18: 10 mg via INTRAVENOUS
  Administered 2014-12-18: 20 mg via INTRAVENOUS

## 2014-12-18 MED ORDER — MEPERIDINE HCL 25 MG/ML IJ SOLN: 6.2500 mg | INTRAMUSCULAR | Status: DC | PRN

## 2014-12-18 MED ORDER — KCL IN DEXTROSE-NACL 20-5-0.45 MEQ/L-%-% IV SOLN
INTRAVENOUS | Status: DC
  Administered 2014-12-18: 17:00:00 via INTRAVENOUS
  Filled 2014-12-18: qty 1000

## 2014-12-18 MED ORDER — HYDROMORPHONE HCL 1 MG/ML IJ SOLN
0.5000 mg | INTRAMUSCULAR | Status: DC | PRN
  Administered 2014-12-18 – 2014-12-19 (×3): 1 mg via INTRAVENOUS
  Filled 2014-12-18 (×3): qty 1

## 2014-12-18 MED ORDER — HYDROMORPHONE HCL 1 MG/ML IJ SOLN
INTRAMUSCULAR | Status: AC
  Filled 2014-12-18: qty 1

## 2014-12-18 MED ORDER — LEVOTHYROXINE SODIUM 125 MCG PO TABS: 125.0000 ug | ORAL_TABLET | Freq: Every day | ORAL | Status: DC

## 2014-12-18 MED ORDER — KETOROLAC TROMETHAMINE 30 MG/ML IJ SOLN
INTRAMUSCULAR | Status: AC
Start: 2014-12-18 — End: 2014-12-18
  Filled 2014-12-18: qty 1

## 2014-12-18 MED ORDER — IPRATROPIUM-ALBUTEROL 0.5-2.5 (3) MG/3ML IN SOLN: 3.0000 mL | Freq: Four times a day (QID) | RESPIRATORY_TRACT | Status: DC | PRN

## 2014-12-18 MED ORDER — OXYCODONE HCL 5 MG/5ML PO SOLN: 5.0000 mg | Freq: Once | ORAL | Status: DC | PRN

## 2014-12-18 MED ORDER — LACTATED RINGERS IV SOLN
INTRAVENOUS | Status: DC
  Administered 2014-12-18 (×2): via INTRAVENOUS

## 2014-12-18 MED ORDER — VERAPAMIL HCL ER 120 MG PO TBCR
120.0000 mg | EXTENDED_RELEASE_TABLET | Freq: Every day | ORAL | Status: DC
  Administered 2014-12-18: 120 mg via ORAL

## 2014-12-18 MED ORDER — MIDAZOLAM HCL 2 MG/2ML IJ SOLN
INTRAMUSCULAR | Status: AC
  Filled 2014-12-18: qty 2

## 2014-12-18 MED ORDER — ACETAMINOPHEN 10 MG/ML IV SOLN
INTRAVENOUS | Status: AC
  Filled 2014-12-18: qty 100

## 2014-12-18 MED ORDER — DEXAMETHASONE SODIUM PHOSPHATE 4 MG/ML IJ SOLN
INTRAMUSCULAR | Status: DC | PRN
  Administered 2014-12-18: 10 mg via INTRAVENOUS

## 2014-12-18 MED ORDER — PROMETHAZINE HCL 25 MG/ML IJ SOLN: 6.2500 mg | INTRAMUSCULAR | Status: DC | PRN

## 2014-12-18 MED ORDER — ALBUTEROL SULFATE HFA 108 (90 BASE) MCG/ACT IN AERS: 2.0000 | INHALATION_SPRAY | RESPIRATORY_TRACT | Status: DC | PRN

## 2014-12-18 MED ORDER — ACETAMINOPHEN 10 MG/ML IV SOLN
1000.0000 mg | Freq: Once | INTRAVENOUS | Status: AC
  Administered 2014-12-18: 1000 mg via INTRAVENOUS

## 2014-12-18 MED ORDER — FENTANYL CITRATE (PF) 100 MCG/2ML IJ SOLN
INTRAMUSCULAR | Status: DC | PRN
  Administered 2014-12-18: 25 ug via INTRAVENOUS
  Administered 2014-12-18: 50 ug via INTRAVENOUS
  Administered 2014-12-18 (×4): 25 ug via INTRAVENOUS
  Administered 2014-12-18: 100 ug via INTRAVENOUS
  Administered 2014-12-18: 25 ug via INTRAVENOUS

## 2014-12-18 MED ORDER — KETAMINE HCL 10 MG/ML IJ SOLN
INTRAMUSCULAR | Status: DC | PRN
  Administered 2014-12-18 (×2): 10 mg via INTRAVENOUS

## 2014-12-18 MED ORDER — FENTANYL CITRATE (PF) 100 MCG/2ML IJ SOLN
INTRAMUSCULAR | Status: AC
  Filled 2014-12-18: qty 6

## 2014-12-18 MED ORDER — POVIDONE-IODINE 10 % EX SOLN
CUTANEOUS | Status: DC | PRN
  Administered 2014-12-18: 1 via TOPICAL

## 2014-12-18 MED ORDER — SULFAMETHOXAZOLE-TRIMETHOPRIM 800-160 MG PO TABS: 1.0000 | ORAL_TABLET | Freq: Two times a day (BID) | ORAL | Status: DC

## 2014-12-18 MED ORDER — OXYCODONE HCL 5 MG PO TABS: 5.0000 mg | ORAL_TABLET | Freq: Once | ORAL | Status: DC | PRN

## 2014-12-18 MED ORDER — ONDANSETRON HCL 4 MG/2ML IJ SOLN: 4.0000 mg | Freq: Four times a day (QID) | INTRAMUSCULAR | Status: DC | PRN

## 2014-12-18 MED ORDER — IBUPROFEN 400 MG PO TABS: 400.0000 mg | ORAL_TABLET | Freq: Three times a day (TID) | ORAL | Status: DC | PRN

## 2014-12-18 MED ORDER — KETAMINE HCL 100 MG/ML IJ SOLN
INTRAMUSCULAR | Status: AC
  Filled 2014-12-18: qty 1

## 2014-12-18 MED ORDER — CEFAZOLIN SODIUM-DEXTROSE 2-3 GM-% IV SOLR
INTRAVENOUS | Status: AC
  Filled 2014-12-18: qty 50

## 2014-12-18 MED ORDER — TRAZODONE HCL 100 MG PO TABS
100.0000 mg | ORAL_TABLET | Freq: Every evening | ORAL | Status: DC | PRN
  Administered 2014-12-18: 100 mg via ORAL

## 2014-12-18 MED ORDER — SODIUM CHLORIDE 0.9 % IV SOLN
INTRAVENOUS | Status: DC | PRN
  Administered 2014-12-18: 1000 mL

## 2014-12-18 MED ORDER — CEFAZOLIN SODIUM 1-5 GM-% IV SOLN
1.0000 g | Freq: Three times a day (TID) | INTRAVENOUS | Status: DC
  Administered 2014-12-18 – 2014-12-19 (×2): 1 g via INTRAVENOUS
  Filled 2014-12-18 (×2): qty 50

## 2014-12-18 MED ORDER — BUPROPION HCL ER (XL) 300 MG PO TB24: 300.0000 mg | ORAL_TABLET | Freq: Every day | ORAL | Status: DC

## 2014-12-18 SURGICAL SUPPLY — 90 items
APPLICATOR COTTON TIP 6IN STRL (MISCELLANEOUS) ×1 IMPLANT
BAG DECANTER FOR FLEXI CONT (MISCELLANEOUS) ×3 IMPLANT
BINDER ABDOMINAL 10 UNV 27-48 (MISCELLANEOUS) ×1 IMPLANT
BINDER ABDOMINAL 12 SM 30-45 (SOFTGOODS) IMPLANT
BINDER BREAST LRG (GAUZE/BANDAGES/DRESSINGS) IMPLANT
BINDER BREAST MEDIUM (GAUZE/BANDAGES/DRESSINGS) IMPLANT
BINDER BREAST XLRG (GAUZE/BANDAGES/DRESSINGS) ×1 IMPLANT
BINDER BREAST XXLRG (GAUZE/BANDAGES/DRESSINGS) IMPLANT
BLADE SURG 10 STRL SS (BLADE) ×3 IMPLANT
BLADE SURG 11 STRL SS (BLADE) ×3 IMPLANT
BLADE SURG 15 STRL LF DISP TIS (BLADE) ×2 IMPLANT
BLADE SURG 15 STRL SS (BLADE) ×3
BNDG GAUZE ELAST 4 BULKY (GAUZE/BANDAGES/DRESSINGS) ×6 IMPLANT
CANISTER LIPO FAT HARVEST (MISCELLANEOUS) ×3 IMPLANT
CANISTER SUCT 1200ML W/VALVE (MISCELLANEOUS) ×3 IMPLANT
CHLORAPREP W/TINT 26ML (MISCELLANEOUS) ×4 IMPLANT
COVER BACK TABLE 60X90IN (DRAPES) ×2 IMPLANT
COVER MAYO STAND STRL (DRAPES) ×3 IMPLANT
COVER SURGICAL LIGHT HANDLE (MISCELLANEOUS) ×1 IMPLANT
DECANTER SPIKE VIAL GLASS SM (MISCELLANEOUS) IMPLANT
DRAIN CHANNEL 15F RND FF W/TCR (WOUND CARE) ×4 IMPLANT
DRAPE LAPAROSCOPIC ABDOMINAL (DRAPES) ×2 IMPLANT
DRSG PAD ABDOMINAL 8X10 ST (GAUZE/BANDAGES/DRESSINGS) ×8 IMPLANT
DRSG TEGADERM 2-3/8X2-3/4 SM (GAUZE/BANDAGES/DRESSINGS) ×2 IMPLANT
ELECT BLADE 4.0 EZ CLEAN MEGAD (MISCELLANEOUS) ×3
ELECT COATED BLADE 2.86 ST (ELECTRODE) ×3 IMPLANT
ELECT REM PT RETURN 9FT ADLT (ELECTROSURGICAL) ×3
ELECTRODE BLDE 4.0 EZ CLN MEGD (MISCELLANEOUS) ×2 IMPLANT
ELECTRODE REM PT RTRN 9FT ADLT (ELECTROSURGICAL) ×2 IMPLANT
EVACUATOR SILICONE 100CC (DRAIN) ×4 IMPLANT
FILTER LIPOSUCTION (MISCELLANEOUS) ×3 IMPLANT
GLOVE BIO SURGEON STRL SZ 6 (GLOVE) ×6 IMPLANT
GLOVE BIO SURGEON STRL SZ 6.5 (GLOVE) IMPLANT
GLOVE BIOGEL PI IND STRL 6.5 (GLOVE) IMPLANT
GLOVE BIOGEL PI IND STRL 7.0 (GLOVE) IMPLANT
GLOVE BIOGEL PI INDICATOR 6.5 (GLOVE)
GLOVE BIOGEL PI INDICATOR 7.0 (GLOVE) ×1
GLOVE ECLIPSE 6.5 STRL STRAW (GLOVE) ×1 IMPLANT
GLOVE EXAM NITRILE MD LF STRL (GLOVE) ×1 IMPLANT
GOWN STRL REUS W/ TWL LRG LVL3 (GOWN DISPOSABLE) ×4 IMPLANT
GOWN STRL REUS W/TWL LRG LVL3 (GOWN DISPOSABLE) ×6
IMPLANT BREAST HP 600CC (Breast) ×2 IMPLANT
IV NS 500ML (IV SOLUTION)
IV NS 500ML BAXH (IV SOLUTION) IMPLANT
KIT FILL SYSTEM UNIVERSAL (SET/KITS/TRAYS/PACK) IMPLANT
LINER CANISTER 1000CC FLEX (MISCELLANEOUS) ×3 IMPLANT
LIQUID BAND (GAUZE/BANDAGES/DRESSINGS) ×5 IMPLANT
MARKER SKIN DUAL TIP RULER LAB (MISCELLANEOUS) ×1 IMPLANT
NDL HYPO 25X1 1.5 SAFETY (NEEDLE) IMPLANT
NDL SAFETY ECLIPSE 18X1.5 (NEEDLE) ×2 IMPLANT
NEEDLE HYPO 18GX1.5 SHARP (NEEDLE) ×3
NEEDLE HYPO 25X1 1.5 SAFETY (NEEDLE) IMPLANT
NS IRRIG 1000ML POUR BTL (IV SOLUTION) ×2 IMPLANT
PACK BASIN DAY SURGERY FS (CUSTOM PROCEDURE TRAY) ×3 IMPLANT
PACK UNIVERSAL I (CUSTOM PROCEDURE TRAY) ×3 IMPLANT
PAD ALCOHOL SWAB (MISCELLANEOUS) ×2 IMPLANT
PENCIL BUTTON HOLSTER BLD 10FT (ELECTRODE) ×3 IMPLANT
PIN SAFETY STERILE (MISCELLANEOUS) ×3 IMPLANT
SHEET MEDIUM DRAPE 40X70 STRL (DRAPES) ×6 IMPLANT
SIZER BREAST REUSE 600CC (SIZER) ×3
SIZER BREAST REUSE 650CC (SIZER) ×3
SIZER BRST REUSE 600CC (SIZER) IMPLANT
SIZER BRST REUSE P5.7XHI 650CC (SIZER) IMPLANT
SIZER GENERIC MENTOR (SIZER) ×2 IMPLANT
SLEEVE SCD COMPRESS KNEE MED (MISCELLANEOUS) ×3 IMPLANT
SPONGE LAP 18X18 X RAY DECT (DISPOSABLE) ×6 IMPLANT
STAPLER VISISTAT 35W (STAPLE) ×3 IMPLANT
SUT ETHILON 2 0 FS 18 (SUTURE) ×3 IMPLANT
SUT MNCRL AB 4-0 PS2 18 (SUTURE) ×6 IMPLANT
SUT PDS 3-0 CT2 (SUTURE)
SUT PDS AB 2-0 CT2 27 (SUTURE) IMPLANT
SUT PDS II 3-0 CT2 27 ABS (SUTURE) IMPLANT
SUT PROLENE 2 0 CT2 30 (SUTURE) IMPLANT
SUT VIC AB 3-0 PS1 18 (SUTURE)
SUT VIC AB 3-0 PS1 18XBRD (SUTURE) IMPLANT
SUT VIC AB 3-0 SH 27 (SUTURE) ×12
SUT VIC AB 3-0 SH 27X BRD (SUTURE) ×4 IMPLANT
SUT VICRYL 4-0 PS2 18IN ABS (SUTURE) ×6 IMPLANT
SYR 20CC LL (SYRINGE) IMPLANT
SYR 50ML LL SCALE MARK (SYRINGE) ×7 IMPLANT
SYR BULB IRRIGATION 50ML (SYRINGE) ×3 IMPLANT
SYR CONTROL 10ML LL (SYRINGE) ×6 IMPLANT
SYR TB 1ML LL NO SAFETY (SYRINGE) ×3 IMPLANT
SYRINGE 10CC LL (SYRINGE) ×11 IMPLANT
TOWEL OR 17X24 6PK STRL BLUE (TOWEL DISPOSABLE) ×7 IMPLANT
TUBE CONNECTING 20X1/4 (TUBING) ×3 IMPLANT
TUBING INFILTRATION IT-10001 (TUBING) ×1 IMPLANT
TUBING SET GRADUATE ASPIR 12FT (MISCELLANEOUS) ×3 IMPLANT
UNDERPAD 30X30 (UNDERPADS AND DIAPERS) ×6 IMPLANT
YANKAUER SUCT BULB TIP NO VENT (SUCTIONS) ×5 IMPLANT

## 2014-12-18 NOTE — Op Note (Signed)
Operative Note   DATE OF OPERATION: 5.31.2016  LOCATION: [facility]  SURGICAL DIVISION: Plastic Surgery  PREOPERATIVE DIAGNOSES:  1. Acquired absence bilateral breast 2. History of breast cancer  POSTOPERATIVE DIAGNOSES:  same  PROCEDURE:  1. Removal bilateral tissue expanders and placement silicone implants 2. Lipofilling to bilateral chest  SURGEON: [name] MD MBA  ASSISTANT: none  ANESTHESIA:  General.   EBL: 482 ml  COMPLICATIONS: None.   INDICATIONS FOR PROCEDURE:  The patient, Stacy Fernandez, is a 54 y.o. female born on 1960/09/18, is here for second stage reconstruction following delayed reconstruction with tissue expanders and acellular dermis.    FINDINGS: Mentor Siltex High Profile 600 ml implants placed bilaterally. Ref [phone] Right SN [id number] Left SN [id number] Approximately 80 ml fat infiltrated into each breast.  DESCRIPTION OF PROCEDURE:  The patient was marked in the preoperative area in standing position to mark sternal notch, chest midline, anterior axillary lines and desired areas for lipofilling. Dog ears present at lateral extent of mastectomy scar were also marked for excision.Over the abdomen, donor sites of bilateral flanks, supra and infraumbilical abdomen were marked. Patient has adherent scar right upper quadrant from open cholecystectomy and counseled this area wound remain depressed/adherent to abdominal wall.The patient was taken to the operating room. SCDs were placed and IV antibiotics were given. The patient's operative site was prepped and draped in a sterile fashion. A time out was performed and all information was confirmed to be correct. Stab incision made over bilateral lateral abdomen and tumescent fluid infiltrated over supra and infraumbilical abdomen, bilateral flanks, total 450 ml tumescent infiltrated. Incision made in right chest mastectomy scar. The pectoralis muscle was divided in direction of muscle  fibers and expander removed. The acelluar dermis was well incorporated. Capsulotomies performed medially and superiorly. Sizer was placed. I then directed attention to left breast. Incision made in prior scar, muscle divided, and expander removed. Capsulotomies performed medially and superiorly. Submuscular dissection completed toward clavicle.   Power assisted liposuction performed over supra- and infraumbilical abdomen, bilateral flanks. Total of 400 ml lipoaspirate. The fat was then washed and prepared by gravity for infiltration. Harvested fat was then infiltrated in subcutaneous and intramuscular planes over superior medial poles, to recreate axillary tails, and within mastectomy flaps. A total of 160 ml fat was infiltrated bilaterally.   At this time bilateral cavities irrigated with solution containing Ancef, gentamicin, and bacitracin. 15 Fr drains placed in right and left breast cavity and secured with 2-0 nylon. A Mentor Siltex Ultra High Profile implant 600 ml was placed in each cavity. Closure was completed with interrupted 3-0 vicryl for approximation of capsule and pectoralis muscle. 4-0 vicryl was placed in dermis and running 4-0 monocryl was used to close skin. Tissue adhesive was applied. The abdominal incisions were approximated with 4-0 monocryl. Dry dressing and abdominal and breast binder applied.   The patient was allowed to wake from anesthesia, extubated and taken to the recovery room in satisfactory condition.   SPECIMENS: none  DRAINS: 15 Fr JP in right and left reconstructed breast  [name], MD [facility] Plastic & Reconstructive Surgery [phone]

## 2014-12-18 NOTE — Anesthesia Postprocedure Evaluation (Signed)
Anesthesia Post Note  Patient: Stacy Fernandez  Procedure(s) Performed: Procedure(s) (LRB): REMOVAL OF BILATERAL TISSUE EXPANDERS,PLACEMENT SILICONE IMPLANTS  (Bilateral) LIPOFILLING TO BILATERAL CHEST (Bilateral)  Anesthesia type: General  Patient location: PACU  Post pain: Pain level controlled  Post assessment: Post-op Vital signs reviewed  Last Vitals: BP 118/64 mmHg  Pulse 81  Temp(Src) 36.6 C (Oral)  Resp 12  Ht 5\' 6"  (1.676 m)  Wt 175 lb 8 oz (79.606 kg)  BMI 28.34 kg/m2  SpO2 100%  Post vital signs: Reviewed  Level of consciousness: sedated  Complications: No apparent anesthesia complications

## 2014-12-18 NOTE — Interval H&P Note (Signed)
History and Physical Interval Note:  12/18/2014 11:36 AM  Stacy Fernandez  has presented today for surgery, with the diagnosis of ACQUIRED ABSENCE BILATERAL BREAST,HISTORY OF BREAST CANCER  The various methods of treatment have been discussed with the patient and family. After consideration of risks, benefits and other options for treatment, the patient has consented to  Procedure(s): REMOVAL OF BILATERAL TISSUE EXPANDERS,PLACEMENT SILICONE IMPLANTS  (Bilateral) LIPOFILLING TO BILATERAL CHEST (Bilateral) as a surgical intervention .  The patient's history has been reviewed, patient examined, no change in status, stable for surgery.  I have reviewed the patient's chart and labs.  Questions were answered to the patient's satisfaction.     Stacy Fernandez

## 2014-12-18 NOTE — Anesthesia Preprocedure Evaluation (Addendum)
Anesthesia Evaluation  Patient identified by MRN, date of birth, ID band Patient awake    Reviewed: Allergy & Precautions, NPO status , Patient's Chart, lab work & pertinent test results  History of Anesthesia Complications Negative for: history of anesthetic complications  Airway Mallampati: II  TM Distance: >3 FB Neck ROM: Full    Dental  (+) Dental Advisory Given, Edentulous Upper   Pulmonary COPDformer smoker,    Pulmonary exam normal       Cardiovascular + angina + CAD and + Peripheral Vascular Disease Normal cardiovascular exam    Neuro/Psych  Headaches, Anxiety    GI/Hepatic Neg liver ROS, GERD-  ,  Endo/Other  Hypothyroidism   Renal/GU negative Renal ROS     Musculoskeletal   Abdominal   Peds  Hematology   Anesthesia Other Findings   Reproductive/Obstetrics                             Anesthesia Physical  Anesthesia Plan  ASA: III  Anesthesia Plan: General   Post-op Pain Management:    Induction: Intravenous  Airway Management Planned: LMA and Oral ETT  Additional Equipment:   Intra-op Plan:   Post-operative Plan: Extubation in OR  Informed Consent: I have reviewed the patients History and Physical, chart, labs and discussed the procedure including the risks, benefits and alternatives for the proposed anesthesia with the patient or authorized representative who has indicated his/her understanding and acceptance.   Dental advisory given  Plan Discussed with: CRNA, Anesthesiologist and Surgeon  Anesthesia Plan Comments:       Anesthesia Quick Evaluation

## 2014-12-18 NOTE — Anesthesia Procedure Notes (Signed)
Procedure Name: Intubation Date/Time: 12/18/2014 12:45 PM Performed by: Lyndee Leo Pre-anesthesia Checklist: Patient identified, Emergency Drugs available, Suction available and Patient being monitored Patient Re-evaluated:Patient Re-evaluated prior to inductionOxygen Delivery Method: Circle System Utilized Preoxygenation: Pre-oxygenation with 100% oxygen Intubation Type: IV induction Ventilation: Mask ventilation without difficulty Laryngoscope Size: Mac and 3 Grade View: Grade I Tube type: Oral Tube size: 7.0 mm Number of attempts: 1 Airway Equipment and Method: Stylet and Oral airway Placement Confirmation: ETT inserted through vocal cords under direct vision,  positive ETCO2 and breath sounds checked- equal and bilateral Tube secured with: Tape Dental Injury: Teeth and Oropharynx as per pre-operative assessment

## 2014-12-18 NOTE — Transfer of Care (Signed)
Immediate Anesthesia Transfer of Care Note  Patient: Stacy Fernandez  Procedure(s) Performed: Procedure(s): REMOVAL OF BILATERAL TISSUE EXPANDERS,PLACEMENT SILICONE IMPLANTS  (Bilateral) LIPOFILLING TO BILATERAL CHEST (Bilateral)  Patient Location: PACU  Anesthesia Type:General  Level of Consciousness: awake, sedated and patient cooperative  Airway & Oxygen Therapy: Patient Spontanous Breathing and Patient connected to face mask oxygen  Post-op Assessment: Report given to RN and Post -op Vital signs reviewed and stable  Post vital signs: Reviewed and stable  Last Vitals:  Filed Vitals:   12/18/14 1132  BP: 102/56  Pulse: 77  Temp: 36.6 C  Resp: 18    Complications: No apparent anesthesia complications

## 2014-12-19 ENCOUNTER — Encounter (HOSPITAL_BASED_OUTPATIENT_CLINIC_OR_DEPARTMENT_OTHER): Payer: Self-pay | Admitting: Plastic Surgery

## 2014-12-19 DIAGNOSIS — Z421 Encounter for breast reconstruction following mastectomy: Secondary | ICD-10-CM | POA: Diagnosis not present

## 2014-12-19 MED ORDER — CEFAZOLIN SODIUM 1-5 GM-% IV SOLN
INTRAVENOUS | Status: AC
Start: 2014-12-19 — End: 2014-12-19
  Filled 2014-12-19: qty 50

## 2014-12-19 MED ORDER — KETOROLAC TROMETHAMINE 30 MG/ML IJ SOLN
INTRAMUSCULAR | Status: AC
  Filled 2014-12-19: qty 1

## 2014-12-19 NOTE — Discharge Instructions (Signed)

## 2014-12-20 ENCOUNTER — Telehealth: Payer: Self-pay | Admitting: *Deleted

## 2014-12-20 ENCOUNTER — Other Ambulatory Visit: Payer: Self-pay

## 2014-12-20 MED ORDER — POTASSIUM CHLORIDE CRYS ER 20 MEQ PO TBCR: 20.0000 meq | EXTENDED_RELEASE_TABLET | Freq: Once | ORAL | Status: DC

## 2014-12-20 MED ORDER — FUROSEMIDE 20 MG PO TABS: 10.0000 mg | ORAL_TABLET | Freq: Every day | ORAL | Status: DC

## 2014-12-20 NOTE — Telephone Encounter (Signed)
Call report rcvd from [facility].  Sent to scan.  LMOVM - refills sent.  Pt to call clinic if she has any questions.

## 2014-12-20 NOTE — Telephone Encounter (Signed)
VM message from pt requesting refills on Lasix and potassium. Please Advise if ok to refill.

## 2014-12-20 NOTE — Telephone Encounter (Signed)
OK to refill

## 2014-12-25 ENCOUNTER — Ambulatory Visit (HOSPITAL_BASED_OUTPATIENT_CLINIC_OR_DEPARTMENT_OTHER): Payer: BLUE CROSS/BLUE SHIELD

## 2014-12-25 ENCOUNTER — Other Ambulatory Visit: Payer: Self-pay | Admitting: *Deleted

## 2014-12-25 ENCOUNTER — Other Ambulatory Visit (HOSPITAL_BASED_OUTPATIENT_CLINIC_OR_DEPARTMENT_OTHER): Payer: BLUE CROSS/BLUE SHIELD

## 2014-12-25 VITALS — BP 93/55 | HR 74 | Temp 97.0°F | Resp 20

## 2014-12-25 DIAGNOSIS — C50812 Malignant neoplasm of overlapping sites of left female breast: Secondary | ICD-10-CM

## 2014-12-25 DIAGNOSIS — Z5112 Encounter for antineoplastic immunotherapy: Secondary | ICD-10-CM

## 2014-12-25 DIAGNOSIS — C50412 Malignant neoplasm of upper-outer quadrant of left female breast: Secondary | ICD-10-CM

## 2014-12-25 DIAGNOSIS — G8918 Other acute postprocedural pain: Secondary | ICD-10-CM

## 2014-12-25 DIAGNOSIS — C50419 Malignant neoplasm of upper-outer quadrant of unspecified female breast: Secondary | ICD-10-CM

## 2014-12-25 LAB — COMPREHENSIVE METABOLIC PANEL (CC13)
ALT: 11 U/L (ref 0–55)
AST: 15 U/L (ref 5–34)
Albumin: 3.1 g/dL — ABNORMAL LOW (ref 3.5–5.0)
Alkaline Phosphatase: 105 U/L (ref 40–150)
Anion Gap: 7 mEq/L (ref 3–11)
BILIRUBIN TOTAL: 0.41 mg/dL (ref 0.20–1.20)
BUN: 11.8 mg/dL (ref 7.0–26.0)
CHLORIDE: 111 meq/L — AB (ref 98–109)
CO2: 24 mEq/L (ref 22–29)
CREATININE: 1.4 mg/dL — AB (ref 0.6–1.1)
Calcium: 8.5 mg/dL (ref 8.4–10.4)
EGFR: 45 mL/min/{1.73_m2} — ABNORMAL LOW (ref >90)
Glucose: 105 mg/dl (ref 70–140)
Potassium: 4.5 mEq/L (ref 3.5–5.1)
Sodium: 142 mEq/L (ref 136–145)
TOTAL PROTEIN: 5.8 g/dL — AB (ref 6.4–8.3)

## 2014-12-25 LAB — CBC WITH DIFFERENTIAL/PLATELET
BASO%: 0.3 % (ref 0.0–2.0)
Basophils Absolute: 0 10*3/uL (ref 0.0–0.1)
EOS%: 1.5 % (ref 0.0–7.0)
Eosinophils Absolute: 0.1 10*3/uL (ref 0.0–0.5)
HCT: 28 % — ABNORMAL LOW (ref 34.8–46.6)
HEMOGLOBIN: 9.5 g/dL — AB (ref 11.6–15.9)
LYMPH%: 19.8 % (ref 14.0–49.7)
MCH: 30.8 pg (ref 25.1–34.0)
MCHC: 33.9 g/dL (ref 31.5–36.0)
MCV: 90.9 fL (ref 79.5–101.0)
MONO#: 0.3 10*3/uL (ref 0.1–0.9)
MONO%: 8.8 % (ref 0.0–14.0)
NEUT%: 69.6 % (ref 38.4–76.8)
NEUTROS ABS: 2.4 10*3/uL (ref 1.5–6.5)
PLATELETS: 181 10*3/uL (ref 145–400)
RBC: 3.08 10*6/uL — ABNORMAL LOW (ref 3.70–5.45)
RDW: 13.5 % (ref 11.2–14.5)
WBC: 3.4 10*3/uL — ABNORMAL LOW (ref 3.9–10.3)
lymph#: 0.7 10*3/uL — ABNORMAL LOW (ref 0.9–3.3)

## 2014-12-25 MED ORDER — ACETAMINOPHEN 325 MG PO TABS
650.0000 mg | ORAL_TABLET | Freq: Once | ORAL | Status: AC
  Administered 2014-12-25: 650 mg via ORAL

## 2014-12-25 MED ORDER — SODIUM CHLORIDE 0.9 % IV SOLN
Freq: Once | INTRAVENOUS | Status: AC
  Administered 2014-12-25: 11:00:00 via INTRAVENOUS

## 2014-12-25 MED ORDER — DIPHENHYDRAMINE HCL 25 MG PO CAPS
ORAL_CAPSULE | ORAL | Status: AC
  Filled 2014-12-25: qty 1

## 2014-12-25 MED ORDER — HEPARIN SOD (PORK) LOCK FLUSH 100 UNIT/ML IV SOLN
500.0000 [IU] | Freq: Once | INTRAVENOUS | Status: AC | PRN
  Administered 2014-12-25: 500 [IU]
  Filled 2014-12-25: qty 5

## 2014-12-25 MED ORDER — ACETAMINOPHEN 325 MG PO TABS
ORAL_TABLET | ORAL | Status: AC
  Filled 2014-12-25: qty 2

## 2014-12-25 MED ORDER — MORPHINE SULFATE 4 MG/ML IJ SOLN
INTRAMUSCULAR | Status: AC
  Filled 2014-12-25: qty 1

## 2014-12-25 MED ORDER — TRASTUZUMAB CHEMO INJECTION 440 MG
6.0000 mg/kg | Freq: Once | INTRAVENOUS | Status: AC
  Administered 2014-12-25: 462 mg via INTRAVENOUS
  Filled 2014-12-25: qty 22

## 2014-12-25 MED ORDER — SODIUM CHLORIDE 0.9 % IJ SOLN
10.0000 mL | INTRAMUSCULAR | Status: DC | PRN
  Administered 2014-12-25: 10 mL
  Filled 2014-12-25: qty 10

## 2014-12-25 MED ORDER — DIPHENHYDRAMINE HCL 25 MG PO CAPS
25.0000 mg | ORAL_CAPSULE | Freq: Once | ORAL | Status: AC
  Administered 2014-12-25: 25 mg via ORAL

## 2014-12-25 MED ORDER — MORPHINE SULFATE 4 MG/ML IJ SOLN
2.0000 mg | Freq: Once | INTRAMUSCULAR | Status: AC
  Administered 2014-12-25: 2 mg via INTRAVENOUS

## 2014-12-25 NOTE — Progress Notes (Signed)
Pt c/o surgical pain 8/10 chest and abdomen on arrival at 10 am.  Reports constant since Tuesday  Pt took 5 mg Oxycodone PO at 8 am.  MD notified - 2 mg IV Morphine ordered and administered.  Dr. Iran Planas notified.   Pain check 1110 - 4/10.

## 2014-12-25 NOTE — Patient Instructions (Signed)
[facility] Discharge Instructions for Patients Receiving Chemotherapy  Today you received the following chemotherapy agents: herceptin  To help prevent nausea and vomiting after your treatment, we encourage you to take your nausea medication.  Take it as often as prescribed.     If you develop nausea and vomiting that is not controlled by your nausea medication, call the clinic. If it is after clinic hours your family physician or the after hours number for the clinic or go to the Emergency Department.   BELOW ARE SYMPTOMS THAT SHOULD BE REPORTED IMMEDIATELY:  *FEVER GREATER THAN 100.5 F  *CHILLS WITH OR WITHOUT FEVER  NAUSEA AND VOMITING THAT IS NOT CONTROLLED WITH YOUR NAUSEA MEDICATION  *UNUSUAL SHORTNESS OF BREATH  *UNUSUAL BRUISING OR BLEEDING  TENDERNESS IN MOUTH AND THROAT WITH OR WITHOUT PRESENCE OF ULCERS  *URINARY PROBLEMS  *BOWEL PROBLEMS  UNUSUAL RASH Items with * indicate a potential emergency and should be followed up as soon as possible.  Feel free to call the clinic you have any questions or concerns. The clinic phone number is [phone].   I have been informed and understand all the instructions given to me. I know to contact the clinic, my physician, or go to the Emergency Department if any problems should occur. I do not have any questions at this time, but understand that I may call the clinic during office hours   should I have any questions or need assistance in obtaining follow up care.    __________________________________________  _____________  __________ Signature of Patient or Authorized Representative            Date                   Time    __________________________________________ Nurse's Signature    

## 2015-01-07 ENCOUNTER — Other Ambulatory Visit: Payer: Self-pay | Admitting: Oncology

## 2015-01-07 ENCOUNTER — Inpatient Hospital Stay (HOSPITAL_COMMUNITY): Admission: RE | Admit: 2015-01-07 | Payer: BLUE CROSS/BLUE SHIELD | Source: Ambulatory Visit

## 2015-01-08 ENCOUNTER — Other Ambulatory Visit (HOSPITAL_COMMUNITY): Payer: BLUE CROSS/BLUE SHIELD

## 2015-01-15 ENCOUNTER — Other Ambulatory Visit (HOSPITAL_BASED_OUTPATIENT_CLINIC_OR_DEPARTMENT_OTHER): Payer: BLUE CROSS/BLUE SHIELD

## 2015-01-15 ENCOUNTER — Telehealth: Payer: Self-pay | Admitting: Oncology

## 2015-01-15 ENCOUNTER — Ambulatory Visit (HOSPITAL_BASED_OUTPATIENT_CLINIC_OR_DEPARTMENT_OTHER): Payer: BLUE CROSS/BLUE SHIELD

## 2015-01-15 ENCOUNTER — Ambulatory Visit (HOSPITAL_BASED_OUTPATIENT_CLINIC_OR_DEPARTMENT_OTHER): Payer: BLUE CROSS/BLUE SHIELD | Admitting: Oncology

## 2015-01-15 VITALS — BP 98/57 | HR 65 | Temp 97.7°F | Resp 20 | Ht 66.0 in | Wt 175.1 lb

## 2015-01-15 DIAGNOSIS — C50812 Malignant neoplasm of overlapping sites of left female breast: Secondary | ICD-10-CM

## 2015-01-15 DIAGNOSIS — Z171 Estrogen receptor negative status [ER-]: Secondary | ICD-10-CM

## 2015-01-15 DIAGNOSIS — Z5112 Encounter for antineoplastic immunotherapy: Secondary | ICD-10-CM

## 2015-01-15 DIAGNOSIS — C50412 Malignant neoplasm of upper-outer quadrant of left female breast: Secondary | ICD-10-CM

## 2015-01-15 DIAGNOSIS — Z9882 Breast implant status: Secondary | ICD-10-CM | POA: Diagnosis not present

## 2015-01-15 DIAGNOSIS — C50419 Malignant neoplasm of upper-outer quadrant of unspecified female breast: Secondary | ICD-10-CM

## 2015-01-15 DIAGNOSIS — G620 Drug-induced polyneuropathy: Secondary | ICD-10-CM

## 2015-01-15 LAB — CBC WITH DIFFERENTIAL/PLATELET
BASO%: 0.4 % (ref 0.0–2.0)
Basophils Absolute: 0 10*3/uL (ref 0.0–0.1)
EOS%: 2.1 % (ref 0.0–7.0)
Eosinophils Absolute: 0.1 10*3/uL (ref 0.0–0.5)
HEMATOCRIT: 30.5 % — AB (ref 34.8–46.6)
HEMOGLOBIN: 10.3 g/dL — AB (ref 11.6–15.9)
LYMPH%: 17.1 % (ref 14.0–49.7)
MCH: 30.8 pg (ref 25.1–34.0)
MCHC: 33.8 g/dL (ref 31.5–36.0)
MCV: 91.3 fL (ref 79.5–101.0)
MONO#: 0.5 10*3/uL (ref 0.1–0.9)
MONO%: 9.3 % (ref 0.0–14.0)
NEUT#: 3.8 10*3/uL (ref 1.5–6.5)
NEUT%: 71.1 % (ref 38.4–76.8)
Platelets: 148 10*3/uL (ref 145–400)
RBC: 3.34 10*6/uL — ABNORMAL LOW (ref 3.70–5.45)
RDW: 13.5 % (ref 11.2–14.5)
WBC: 5.3 10*3/uL (ref 3.9–10.3)
lymph#: 0.9 10*3/uL (ref 0.9–3.3)

## 2015-01-15 LAB — COMPREHENSIVE METABOLIC PANEL (CC13)
ALBUMIN: 3.7 g/dL (ref 3.5–5.0)
ALT: 13 U/L (ref 0–55)
AST: 13 U/L (ref 5–34)
Alkaline Phosphatase: 113 U/L (ref 40–150)
Anion Gap: 6 mEq/L (ref 3–11)
BUN: 13.7 mg/dL (ref 7.0–26.0)
CALCIUM: 9 mg/dL (ref 8.4–10.4)
CHLORIDE: 111 meq/L — AB (ref 98–109)
CO2: 26 meq/L (ref 22–29)
Creatinine: 1.2 mg/dL — ABNORMAL HIGH (ref 0.6–1.1)
EGFR: 52 mL/min/{1.73_m2} — AB (ref >90)
GLUCOSE: 76 mg/dL (ref 70–140)
Potassium: 4.2 mEq/L (ref 3.5–5.1)
Sodium: 142 mEq/L (ref 136–145)
Total Bilirubin: 0.52 mg/dL (ref 0.20–1.20)
Total Protein: 6.3 g/dL — ABNORMAL LOW (ref 6.4–8.3)

## 2015-01-15 MED ORDER — SODIUM CHLORIDE 0.9 % IJ SOLN
10.0000 mL | INTRAMUSCULAR | Status: DC | PRN
  Administered 2015-01-15: 10 mL
  Filled 2015-01-15: qty 10

## 2015-01-15 MED ORDER — LIDOCAINE-PRILOCAINE 2.5-2.5 % EX CREA
TOPICAL_CREAM | CUTANEOUS | Status: AC
  Filled 2015-01-15: qty 5

## 2015-01-15 MED ORDER — DIPHENHYDRAMINE HCL 25 MG PO CAPS
ORAL_CAPSULE | ORAL | Status: AC
  Filled 2015-01-15: qty 1

## 2015-01-15 MED ORDER — SODIUM CHLORIDE 0.9 % IV SOLN
Freq: Once | INTRAVENOUS | Status: AC
  Administered 2015-01-15: 11:00:00 via INTRAVENOUS

## 2015-01-15 MED ORDER — DIPHENHYDRAMINE HCL 25 MG PO CAPS
25.0000 mg | ORAL_CAPSULE | Freq: Once | ORAL | Status: AC
  Administered 2015-01-15: 25 mg via ORAL

## 2015-01-15 MED ORDER — ACETAMINOPHEN 325 MG PO TABS
650.0000 mg | ORAL_TABLET | Freq: Once | ORAL | Status: AC
  Administered 2015-01-15: 650 mg via ORAL

## 2015-01-15 MED ORDER — HEPARIN SOD (PORK) LOCK FLUSH 100 UNIT/ML IV SOLN
500.0000 [IU] | Freq: Once | INTRAVENOUS | Status: AC | PRN
  Administered 2015-01-15: 500 [IU]
  Filled 2015-01-15: qty 5

## 2015-01-15 MED ORDER — TRASTUZUMAB CHEMO INJECTION 440 MG
6.0000 mg/kg | Freq: Once | INTRAVENOUS | Status: AC
  Administered 2015-01-15: 462 mg via INTRAVENOUS
  Filled 2015-01-15: qty 22

## 2015-01-15 MED ORDER — ACETAMINOPHEN 325 MG PO TABS
ORAL_TABLET | ORAL | Status: AC
  Filled 2015-01-15: qty 2

## 2015-01-15 NOTE — Telephone Encounter (Signed)
Gave avs & calendar for July Thru October per POF. Sent message to authorize ECHO.

## 2015-01-15 NOTE — Progress Notes (Signed)
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 She brought it to her primary physician's attention him a and on 02/21/2014 she underwent bilateral diagnostic mammography with tomography at [facility]. The breast composition was category B. In the left breast at the 3:00 position there was an irregular mass associated with skin retraction. Ultrasound performed on the same day confirmed a 1.1 cm irregular solid mass in the left breast at the 3:00 position. There were no abnormalities by sonography in the left axilla or in the superior portion of the breast, which is for the patient experiences some tenderness.  Biopsy of the left breast area in question 02/22/2014 showed ([id number]) and invasive ductal carcinoma, grade 3, estrogen and progesterone receptor negative, with an MIB-1 of 83%, and HER-2 amplified, the signals ratio being 2.07, and a copy number per cell 4.75.  On 02/28/2014 the patient underwent bilateral breast MRI, which showed in the posterior third of the left breast at the 3:00 position an irregular spiculated mass measuring 1.8 cm. The left breast was unremarkable and there were no abnormal appearing lymph nodes.  The patient's subsequent  history is as detailed below  INTERVAL HISTORY: Stacy Fernandez returns today for follow up of her breast cancer, accompanied by her husband [name].  Since her last visit here she had her silicon implants placed. She generally did well with the surgery. She is still hurting, although a bit less, and she is taking oxycodone 2-3 times a day. This does not constipate her. She continues on trastuzumab and will receive a dose today. She has had no problems with that and her most recent echo in April was fine.   REVIEW OF SYSTEMS:  Aside from the problems just mentioned, she is very concerned because the area in the underside of the right implant feels "rippley" to her.  Her hair started to come back, and then " stopped". She is limited to but she can lift, namely no more than 20 pounds, and is hoping to be able to get back to work by the end of July.  She continues to have burning feelings particular over her feet. She never developed significant neuropathy over her hands. She describes herself as mildly fatigued. She has significant sinus issues. She has a little bit of a sore throat, and feels short of breath when walking even over a flat area. Sometimes she has blood in her urine. This is not a new problem. She feels forgetful but not anxious or depressed. A detailed review of systems today was otherwise stable.   PAST MEDICAL HISTORY: Past Medical History  Diagnosis Date  . Hypothyroidism   . Anxiety     Due to situation, CA breast  . Wears glasses   . Wears dentures     upper  . Migraines   . COPD (chronic obstructive pulmonary disease)  denies SOB with ADLs  . Hematuria     cause unknow- saw Urologist  . History of breast cancer 03/2014    left  . History of pneumonia 09/2014  . History of chemotherapy     finished chemo 09/2014  . Neuropathic pain of both feet     due to effects of chemo  . History of gastroesophageal reflux (GERD)     no problems since Nissen fundoplication  . Supraclinoid  carotid artery aneurysm, small     left - 2 mm; states is being monitored    PAST SURGICAL HISTORY: Past Surgical History  Procedure Laterality Date  . Cholecystectomy    . Appendectomy    . Laparoscopic nissen fundoplication  09/7900    for GERD- not a problem  . Video bronchoscopy Bilateral 01/16/2013    Procedure: VIDEO BRONCHOSCOPY WITHOUT FLUORO;  Surgeon: [name], MD;  Location: [facility];  Service: Cardiopulmonary;  Laterality: Bilateral;  . Mastectomy complete / simple w/ sentinel node biopsy Left 04/02/2014    axillary SLN  . Mastectomy complete / simple Right 04/02/2014    PROPHYLACTIC  . Portacath placement Right 04/02/2014  . Total abdominal hysterectomy      partial   . Simple mastectomy with axillary sentinel node biopsy Bilateral 04/02/2014    Procedure: LEFT TOTAL MASTECTOMY WITH LEFT AXILLARY SENTINEL NODE BIOPSY, RIGHT PROPHYLACTIC MASTETCTOMY;  Surgeon: [name], MD;  Location: [facility];  Service: General;  Laterality: Bilateral;  . Portacath placement N/A 04/02/2014    Procedure: INSERTION PORT-A-CATH;  Surgeon: [name], MD;  Location: [place];  Service: General;  Laterality: N/A;  . Left heart catheterization with coronary angiogram N/A 12/26/2012    Procedure: LEFT HEART CATHETERIZATION WITH CORONARY ANGIOGRAM;  Surgeon: Lorretta Harp, MD;  Location: [facility] CATH LAB;  Service: Cardiovascular;  Laterality: N/A;  . Breast reconstruction with placement of tissue expander and flex hd (acellular hydrated dermis) Bilateral 09/25/2014    Procedure: PLACEMENT OF BILATERAL TISSUE EXPANDER AND  ACELLULAR DERMIS FOR BREAST RECONSTRUCTION ;  Surgeon: [name], MD;  Location: [facility];  Service: Plastics;  Laterality: Bilateral;  . Bilateral salpingoophorectomy    . Cardiac catheterization  12/26/2012    "essentially normal coronary arteries, normal LV function"  . Nissen fundoplication  10/27/7351  . Esophagogastroduodenoscopy (egd) with propofol   01/18/2013  . Removal of bilateral tissue expanders with placement of bilateral breast implants Bilateral 12/18/2014    Procedure: REMOVAL OF BILATERAL TISSUE EXPANDERS,PLACEMENT SILICONE IMPLANTS ;  Surgeon: [name], MD;  Location: ;  Service: Plastics;  Laterality: Bilateral;  . Liposuction with lipofilling Bilateral 12/18/2014    Procedure: LIPOFILLING TO BILATERAL CHEST;  Surgeon: [name], MD;  Location: [place];  Service: Plastics;  Laterality: Bilateral;    FAMILY HISTORY Family History  Problem Relation Age of Onset  . Heart failure Mother   . Colon cancer Father 71    stomach cancer also in 76s  . Throat cancer Brother 42    smoker  . Heart attack Maternal Grandmother   . Colon cancer Paternal Grandmother 45  . Cancer Paternal Grandfather     kidney and bladder  . Throat cancer Brother 37    throat cancer, smoker  . Ovarian cancer Sister 56    ovarian cancer at 66, colorectal cancer at 67  . Breast cancer Paternal Aunt 55  . Ovarian cancer Other 56    niece with ovarian cancer  the patient's father died at the age of 18 from metastatic stomach cancer the patient's father's mother died from colon cancer at the age of 67. The patient's mother died at the age of 67. The patient had 2 brothers and 2 sisters. One brother died at the age of 37 from throat cancer metastatic to the lung. He was a smoker. A second brother Was diagnosed at age 85 with throat cancer metastatic to lung. He has been given 90 days to live" is not looking good". One sister died from colon cancer metastatic to bone. One sister died of a drug overdose. There is no history of breast or bearing cancer in the family to the patient's knowledge.  GYNECOLOGIC HISTORY:  No LMP recorded. Patient has had a hysterectomy. Menarche age 65, first live birth age 23, the patient is [place] P4. She underwent hysterectomy with bilateral salpingo-oophorectomy approximately  1990. She took hormone replacement for approximately 2 years.  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    ADVANCED DIRECTIVES: Not in place   HEALTH MAINTENANCE: History  Substance Use Topics  . Smoking status: Former Smoker -- 0.00 packs/day for 37 years    Quit date: 03/29/2014  . Smokeless tobacco: Never Used     Comment: uses vapor occasionally  . Alcohol Use: No     Colonoscopy: August 2015  PAP: May 2013  Bone density:  Lipid panel:  Allergies  Allergen Reactions  . Contrast Media [Iodinated Diagnostic Agents] Anaphylaxis  . Prednisone Shortness Of Breath and Swelling    CAN TOLERATE IF GIVEN BENADRYL PRIOR     Current Outpatient Prescriptions  Medication Sig Dispense Refill  . buPROPion (WELLBUTRIN XL) 300 MG 24 hr tablet Take 300 mg by mouth daily.    . DUONEB 0.5-2.5 (3) MG/3ML SOLN Inhale 3 mLs into the lungs every 6 (six) hours as needed (for shortness of breath).   3  . furosemide (LASIX) 20 MG tablet Take 0.5 tablets (10 mg total) by mouth daily. 30 tablet 0  . gabapentin (NEURONTIN) 300 MG capsule Take 1 capsule (300 mg total) by mouth 2 (two) times daily. 120 capsule 6  . gabapentin (NEURONTIN) 300 MG capsule TAKE 1 CAPSULE (300 MG TOTAL) BY MOUTH 4 (FOUR) TIMES DAILY. 120 capsule 3  . ibuprofen (ADVIL,MOTRIN) 400 MG tablet Take 1 tablet (400 mg total) by mouth every 8 (eight) hours as needed for moderate pain. 60 tablet 0  . levothyroxine (SYNTHROID, LEVOTHROID) 75 MCG tablet Take 125 mcg by mouth daily before breakfast.     . lidocaine-prilocaine (EMLA) cream Apply 1 application topically as needed (for chemo injections).   0  . LORazepam (ATIVAN) 0.5 MG tablet Take 1 tablet (0.5 mg total) by mouth at bedtime as needed (Nausea or vomiting). 30 tablet 0  . montelukast (SINGULAIR) 10 MG tablet Take 10 mg by mouth at bedtime.     .  ondansetron (ZOFRAN) 8 MG tablet Take 8 mg by mouth as needed. For N/V  1  . oxyCODONE (OXY IR/ROXICODONE) 5 MG immediate release tablet Take 1-2 tablets (5-10 mg total) by mouth every 4 (four) hours as needed for moderate pain. 50 tablet 0  . potassium chloride SA (K-DUR,KLOR-CON) 20 MEQ tablet Take 1 tablet (20 mEq total) by mouth once. 30 tablet 1  . PROAIR HFA 108 (90 BASE) MCG/ACT inhaler Inhale 2 puffs into the  lungs every 4 (four) hours as needed for wheezing or shortness of breath.     . rizatriptan (MAXALT-MLT) 5 MG disintegrating tablet TAKE 1 TABLET (5 MG TOTAL) BY MOUTH AS NEEDED FOR MIGRAINE. MAY REPEAT IN 2 HOURS IF NEEDED 15 tablet 5  . senna-docusate (SENOKOT-S) 8.6-50 MG per tablet Take 1 tablet by mouth at bedtime as needed for mild constipation. 20 tablet 0  . sulfamethoxazole-trimethoprim (BACTRIM DS,SEPTRA DS) 800-160 MG per tablet Take 1 tablet by mouth 2 (two) times daily. 12 tablet 0  . SUMAtriptan (IMITREX) 6 MG/0.5ML SOLN injection Inject 6 mg into the skin every 2 (two) hours as needed for migraine or headache. May repeat in 2 hours if headache persists or recurs. Max 2 doses in 24 hours    . Trastuzumab (HERCEPTIN IV) Inject 462 mg into the vein every 21 ( twenty-one) days.    . traZODone (DESYREL) 100 MG tablet TAKE 1 TABLET (100 MG TOTAL) BY MOUTH AT BEDTIME. 30 tablet 1  . verapamil (CALAN-SR) 120 MG CR tablet TAKE 1 TABLET BY MOUTH AT BEDTIME ( MAX 30 DAY SUPPLY PER INSURANCE) 30 tablet 6   No current facility-administered medications for this visit.    OBJECTIVE: Middle-aged white woman who appears stated age 54 Vitals:   01/15/15 0937  BP: 98/57  Pulse: 65  Temp: 97.7 F (36.5 C)  Resp: 20     Body mass index is 28.28 kg/(m^2).    ECOG FS:1 - Symptomatic but completely ambulatory   Sclerae unicteric, pupils round and equal Oropharynx clear and moist-- no thrush or other lesions No cervical or supraclavicular adenopathy Lungs no rales or rhonchi Heart  regular rate and rhythm Abd soft, nontender, positive bowel sounds MSK no focal spinal tenderness, no upper extremity lymphedema Neuro: nonfocal, well oriented, appropriate affect Breasts:  Status post bilateral mastectomies with bilateral silicone implants. The general cosmetic effect is good. There is no evidence of chest wall recurrence in both axillae are benign. In the underside of the right breast there is indeed some irregularity, which will probably be filled in the future with some fat grafts.    LAB RESULTS:  CMP     Component Value Date/Time   NA 142 01/15/2015 0925   NA 141 10/01/2014 0720   K 4.2 01/15/2015 0925   K 3.1* 10/01/2014 0720   CL 108 10/01/2014 0720   CO2 26 01/15/2015 0925   CO2 25 10/01/2014 0720   GLUCOSE 76 01/15/2015 0925   GLUCOSE 81 10/01/2014 0720   BUN 13.7 01/15/2015 0925   BUN 7 10/01/2014 0720   CREATININE 1.2* 01/15/2015 0925   CREATININE 0.84 10/01/2014 0720   CALCIUM 9.0 01/15/2015 0925   CALCIUM 8.7 10/01/2014 0720   PROT 6.3* 01/15/2015 0925   PROT 6.1 09/28/2014 2024   ALBUMIN 3.7 01/15/2015 0925   ALBUMIN 3.0* 09/28/2014 2024   AST 13 01/15/2015 0925   AST 14 09/28/2014 2024   ALT 13 01/15/2015 0925   ALT 10 09/28/2014 2024   ALKPHOS 113 01/15/2015 0925   ALKPHOS 80 09/28/2014 2024   BILITOT 0.52 01/15/2015 0925   BILITOT 0.6 09/28/2014 2024   GFRNONAA 78* 10/01/2014 0720   GFRAA >90 10/01/2014 0720    I No results found for: SPEP  Lab Results  Component Value Date   WBC 5.3 01/15/2015   NEUTROABS 3.8 01/15/2015   HGB 10.3* 01/15/2015   HCT 30.5* 01/15/2015   MCV 91.3 01/15/2015   PLT 148 01/15/2015  Chemistry      Component Value Date/Time   NA 142 01/15/2015 0925   NA 141 10/01/2014 0720   K 4.2 01/15/2015 0925   K 3.1* 10/01/2014 0720   CL 108 10/01/2014 0720   CO2 26 01/15/2015 0925   CO2 25 10/01/2014 0720   BUN 13.7 01/15/2015 0925   BUN 7 10/01/2014 0720   CREATININE 1.2* 01/15/2015 0925    CREATININE 0.84 10/01/2014 0720      Component Value Date/Time   CALCIUM 9.0 01/15/2015 0925   CALCIUM 8.7 10/01/2014 0720   ALKPHOS 113 01/15/2015 0925   ALKPHOS 80 09/28/2014 2024   AST 13 01/15/2015 0925   AST 14 09/28/2014 2024   ALT 13 01/15/2015 0925   ALT 10 09/28/2014 2024   BILITOT 0.52 01/15/2015 0925   BILITOT 0.6 09/28/2014 2024       No results found for: LABCA2  No components found for: [id number]  No results for input(s): INR in the last 168 hours.  Urinalysis    Component Value Date/Time   COLORURINE YELLOW 09/29/2014 0119   APPEARANCEUR CLEAR 09/29/2014 0119   LABSPEC 1.010 09/29/2014 0119   LABSPEC 1.010 07/10/2014 1004   PHURINE 5.5 09/29/2014 0119   PHURINE 6.0 07/10/2014 1004   GLUCOSEU 100* 09/29/2014 0119   GLUCOSEU Negative 07/10/2014 1004   HGBUR MODERATE* 09/29/2014 0119   HGBUR Moderate 07/10/2014 1004   BILIRUBINUR NEGATIVE 09/29/2014 0119   BILIRUBINUR Negative 07/10/2014 1004   KETONESUR NEGATIVE 09/29/2014 0119   KETONESUR Negative 07/10/2014 1004   PROTEINUR NEGATIVE 09/29/2014 0119   PROTEINUR Negative 07/10/2014 1004   UROBILINOGEN 1.0 09/29/2014 0119   UROBILINOGEN 0.2 07/10/2014 1004   NITRITE NEGATIVE 09/29/2014 0119   NITRITE Negative 07/10/2014 1004   LEUKOCYTESUR NEGATIVE 09/29/2014 0119   LEUKOCYTESUR Negative 07/10/2014 1004    STUDIES: Most recent echocardiogram on 10/29/14 showed an EF of 50-55%  ASSESSMENT: 54 y.o. BRCA negative [place] woman status post left breast biopsy 02/22/2014 for a clinical T2/T3 NX, stage II or III invasive ductal carcinoma, grade 3, estrogen and progesterone receptor negative, with an MIB-1 of 83%, and HER-2 amplified with a signals a ratio of 2.07and a copy number per cell of 4.75  (1) biopsy of an additional area of enhancement in the left breast 03/08/2014 showed ductal carcinoma in situ, estrogen and progesterone receptor negative.  (2) status post bilateral mastectomies with left  sentinel lymph node sampling 04/02/2014, showing:  (a) on the right, benign breast tissue including a single negative lymph node  (b) On  the left, a  pT1c pN0, stage IA invasive ductal carcinoma, grade 3, with negative margins  (3) adjuvant chemotherapy started 05/08/2014, consisting of carboplatin and docetaxel given every 3 weeks x6, together with trastuzumab, with neulasta day 2   (a) docetaxel removed from final cycle 08/21/2014 because of persistent neuropathy symptoms  (4) trastuzumab to be continued to complete a year (through OCT 2016)   (a) echo 10/29/14 showed a well-preserved ejection fraction  (5) reconstruction with bilateral silicone implants 82/42/3536 ([place])  (6) tobacco abuse: The patient quit smoking 03/30/2014  (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 She will receive trastuzumab today and every 3 weeks through mid October. She will have her next echocardiogram in late July. She will see [place] around that time and then she will see me again with her final trastuzumab dose in mid October. She will have one final  echo after that, which we will set up at that time.   There have been some nodules in the long that Dr. Elsworth Soho has been following and he was wondering if we were going to do a PET or CT scan. We certainly can proceed to a CT scan at this point. I have entered the orders. I would not favor a PET, since she is very recently postop and we would likely get false positive issues from that test   Stacy Fernandez continues to take gabapentin for her neuropathy. If the problem has not significantly improved in a few months we will consider acupuncture.    she knows to call for any other problems that may develop before her next visit here.  [name], MD   01/15/2015 10:13 AM

## 2015-01-15 NOTE — Patient Instructions (Signed)
[facility] Discharge Instructions for Patients Receiving Chemotherapy  Today you received the following chemotherapy agent: Herceptin   To help prevent nausea and vomiting after your treatment, we encourage you to take your nausea medication as prescribed.    If you develop nausea and vomiting that is not controlled by your nausea medication, call the clinic.   BELOW ARE SYMPTOMS THAT SHOULD BE REPORTED IMMEDIATELY:  *FEVER GREATER THAN 100.5 F  *CHILLS WITH OR WITHOUT FEVER  NAUSEA AND VOMITING THAT IS NOT CONTROLLED WITH YOUR NAUSEA MEDICATION  *UNUSUAL SHORTNESS OF BREATH  *UNUSUAL BRUISING OR BLEEDING  TENDERNESS IN MOUTH AND THROAT WITH OR WITHOUT PRESENCE OF ULCERS  *URINARY PROBLEMS  *BOWEL PROBLEMS  UNUSUAL RASH Items with * indicate a potential emergency and should be followed up as soon as possible.  Feel free to call the clinic you have any questions or concerns. The clinic phone number is [phone].  Please show the [facility] at check-in to the Emergency Department and triage nurse.

## 2015-01-17 ENCOUNTER — Telehealth: Payer: Self-pay

## 2015-01-17 ENCOUNTER — Telehealth: Payer: Self-pay | Admitting: Oncology

## 2015-01-17 NOTE — Telephone Encounter (Signed)
Left message to confirmed appointment for ECHO 07/18.

## 2015-01-17 NOTE — Telephone Encounter (Signed)
Spoke with patient regarding continuing Botox, pt states she will contact office when she is ready due to other medical issues.

## 2015-01-24 ENCOUNTER — Ambulatory Visit (HOSPITAL_COMMUNITY)
Admission: RE | Admit: 2015-01-24 | Discharge: 2015-01-24 | Disposition: A | Discharge status: Home / Self Care | Payer: BLUE CROSS/BLUE SHIELD | Source: Ambulatory Visit | Attending: Oncology | Admitting: Oncology

## 2015-01-24 ENCOUNTER — Encounter (HOSPITAL_COMMUNITY): Payer: Self-pay

## 2015-01-24 DIAGNOSIS — J439 Emphysema, unspecified: Secondary | ICD-10-CM | POA: Diagnosis not present

## 2015-01-24 DIAGNOSIS — C50412 Malignant neoplasm of upper-outer quadrant of left female breast: Secondary | ICD-10-CM

## 2015-01-24 DIAGNOSIS — I2510 Atherosclerotic heart disease of native coronary artery without angina pectoris: Secondary | ICD-10-CM | POA: Insufficient documentation

## 2015-01-24 DIAGNOSIS — C50912 Malignant neoplasm of unspecified site of left female breast: Secondary | ICD-10-CM | POA: Insufficient documentation

## 2015-01-24 DIAGNOSIS — R918 Other nonspecific abnormal finding of lung field: Secondary | ICD-10-CM | POA: Insufficient documentation

## 2015-01-24 MED ORDER — IOHEXOL 300 MG/ML  SOLN
100.0000 mL | Freq: Once | INTRAMUSCULAR | Status: AC | PRN
  Administered 2015-01-24: 80 mL via INTRAVENOUS

## 2015-01-28 ENCOUNTER — Telehealth: Payer: Self-pay | Admitting: *Deleted

## 2015-01-28 ENCOUNTER — Telehealth: Payer: Self-pay | Admitting: Pulmonary Disease

## 2015-01-28 NOTE — Telephone Encounter (Signed)
"  I had CT chest last week and no one has called results. Dr. Jana Hakim ordered test on behalf of my pulmonologist."  Informed patient no metastatic disease in chest.  Routed report to Pulmonologist.  Denies further questions.

## 2015-01-28 NOTE — Telephone Encounter (Signed)
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

## 2015-01-28 NOTE — Telephone Encounter (Signed)
Dr. Jana Hakim ordered CT done 01/24/15.  Pt reports Dr. Jana Hakim was going to send report to Dr. Elsworth Soho. She is requesting results. Aware RA not back in office until Friday.  Please advise thanks

## 2015-01-29 NOTE — Telephone Encounter (Signed)
Called and spoke to pt. Informed her of the results per RA. Pt verbalized understanding and denied any further questions or concerns at this time.

## 2015-02-04 ENCOUNTER — Other Ambulatory Visit: Payer: Self-pay | Admitting: Oncology

## 2015-02-04 ENCOUNTER — Other Ambulatory Visit: Payer: Self-pay | Admitting: Nurse Practitioner

## 2015-02-04 ENCOUNTER — Ambulatory Visit (HOSPITAL_COMMUNITY)
Admission: RE | Admit: 2015-02-04 | Discharge: 2015-02-04 | Disposition: A | Discharge status: Home / Self Care | Payer: BLUE CROSS/BLUE SHIELD | Source: Ambulatory Visit | Attending: Oncology | Admitting: Oncology

## 2015-02-04 DIAGNOSIS — Z720 Tobacco use: Secondary | ICD-10-CM

## 2015-02-04 DIAGNOSIS — D630 Anemia in neoplastic disease: Secondary | ICD-10-CM

## 2015-02-04 DIAGNOSIS — E039 Hypothyroidism, unspecified: Secondary | ICD-10-CM | POA: Diagnosis not present

## 2015-02-04 DIAGNOSIS — C50919 Malignant neoplasm of unspecified site of unspecified female breast: Secondary | ICD-10-CM | POA: Diagnosis not present

## 2015-02-04 DIAGNOSIS — C50412 Malignant neoplasm of upper-outer quadrant of left female breast: Secondary | ICD-10-CM | POA: Diagnosis not present

## 2015-02-04 DIAGNOSIS — I2510 Atherosclerotic heart disease of native coronary artery without angina pectoris: Secondary | ICD-10-CM | POA: Insufficient documentation

## 2015-02-04 DIAGNOSIS — J449 Chronic obstructive pulmonary disease, unspecified: Secondary | ICD-10-CM | POA: Diagnosis not present

## 2015-02-04 DIAGNOSIS — I7090 Unspecified atherosclerosis: Secondary | ICD-10-CM | POA: Diagnosis not present

## 2015-02-04 DIAGNOSIS — Z9882 Breast implant status: Secondary | ICD-10-CM | POA: Diagnosis not present

## 2015-02-04 HISTORY — PX: TRANSTHORACIC ECHOCARDIOGRAM: SHX275

## 2015-02-05 ENCOUNTER — Ambulatory Visit (HOSPITAL_BASED_OUTPATIENT_CLINIC_OR_DEPARTMENT_OTHER): Payer: BLUE CROSS/BLUE SHIELD | Admitting: Nurse Practitioner

## 2015-02-05 ENCOUNTER — Encounter: Payer: Self-pay | Admitting: Nurse Practitioner

## 2015-02-05 ENCOUNTER — Other Ambulatory Visit (HOSPITAL_BASED_OUTPATIENT_CLINIC_OR_DEPARTMENT_OTHER): Payer: BLUE CROSS/BLUE SHIELD

## 2015-02-05 ENCOUNTER — Ambulatory Visit (HOSPITAL_BASED_OUTPATIENT_CLINIC_OR_DEPARTMENT_OTHER): Payer: BLUE CROSS/BLUE SHIELD

## 2015-02-05 VITALS — BP 100/50 | HR 92 | Temp 97.7°F | Resp 18 | Ht 66.0 in | Wt 171.7 lb

## 2015-02-05 DIAGNOSIS — C50412 Malignant neoplasm of upper-outer quadrant of left female breast: Secondary | ICD-10-CM

## 2015-02-05 DIAGNOSIS — G620 Drug-induced polyneuropathy: Secondary | ICD-10-CM

## 2015-02-05 DIAGNOSIS — Z5112 Encounter for antineoplastic immunotherapy: Secondary | ICD-10-CM

## 2015-02-05 DIAGNOSIS — K625 Hemorrhage of anus and rectum: Secondary | ICD-10-CM

## 2015-02-05 DIAGNOSIS — C50419 Malignant neoplasm of upper-outer quadrant of unspecified female breast: Secondary | ICD-10-CM

## 2015-02-05 DIAGNOSIS — C50812 Malignant neoplasm of overlapping sites of left female breast: Secondary | ICD-10-CM

## 2015-02-05 DIAGNOSIS — Z171 Estrogen receptor negative status [ER-]: Secondary | ICD-10-CM

## 2015-02-05 DIAGNOSIS — T451X5A Adverse effect of antineoplastic and immunosuppressive drugs, initial encounter: Secondary | ICD-10-CM

## 2015-02-05 LAB — COMPREHENSIVE METABOLIC PANEL (CC13)
ALK PHOS: 114 U/L (ref 40–150)
ALT: 12 U/L (ref 0–55)
AST: 13 U/L (ref 5–34)
Albumin: 4.2 g/dL (ref 3.5–5.0)
Anion Gap: 9 mEq/L (ref 3–11)
BUN: 20.6 mg/dL (ref 7.0–26.0)
CALCIUM: 9.6 mg/dL (ref 8.4–10.4)
CHLORIDE: 109 meq/L (ref 98–109)
CO2: 23 mEq/L (ref 22–29)
CREATININE: 1.3 mg/dL — AB (ref 0.6–1.1)
EGFR: 48 mL/min/{1.73_m2} — ABNORMAL LOW (ref >90)
GLUCOSE: 86 mg/dL (ref 70–140)
Potassium: 4.3 mEq/L (ref 3.5–5.1)
SODIUM: 141 meq/L (ref 136–145)
TOTAL PROTEIN: 7.1 g/dL (ref 6.4–8.3)
Total Bilirubin: 0.52 mg/dL (ref 0.20–1.20)

## 2015-02-05 LAB — CBC WITH DIFFERENTIAL/PLATELET
BASO%: 0.7 % (ref 0.0–2.0)
Basophils Absolute: 0 10*3/uL (ref 0.0–0.1)
EOS%: 1.2 % (ref 0.0–7.0)
Eosinophils Absolute: 0 10*3/uL (ref 0.0–0.5)
HCT: 34.4 % — ABNORMAL LOW (ref 34.8–46.6)
HGB: 11.7 g/dL (ref 11.6–15.9)
LYMPH%: 24.5 % (ref 14.0–49.7)
MCH: 30.3 pg (ref 25.1–34.0)
MCHC: 34 g/dL (ref 31.5–36.0)
MCV: 89.3 fL (ref 79.5–101.0)
MONO#: 0.4 10*3/uL (ref 0.1–0.9)
MONO%: 9.6 % (ref 0.0–14.0)
NEUT%: 64 % (ref 38.4–76.8)
NEUTROS ABS: 2.5 10*3/uL (ref 1.5–6.5)
Platelets: 208 10*3/uL (ref 145–400)
RBC: 3.85 10*6/uL (ref 3.70–5.45)
RDW: 13.5 % (ref 11.2–14.5)
WBC: 3.9 10*3/uL (ref 3.9–10.3)
lymph#: 1 10*3/uL (ref 0.9–3.3)

## 2015-02-05 MED ORDER — IBUPROFEN 400 MG PO TABS: 400.0000 mg | ORAL_TABLET | Freq: Three times a day (TID) | ORAL | Status: DC | PRN

## 2015-02-05 MED ORDER — ACETAMINOPHEN 325 MG PO TABS
ORAL_TABLET | ORAL | Status: AC
  Filled 2015-02-05: qty 2

## 2015-02-05 MED ORDER — ACETAMINOPHEN 325 MG PO TABS
650.0000 mg | ORAL_TABLET | Freq: Once | ORAL | Status: AC
  Administered 2015-02-05: 650 mg via ORAL

## 2015-02-05 MED ORDER — SODIUM CHLORIDE 0.9 % IJ SOLN
10.0000 mL | INTRAMUSCULAR | Status: DC | PRN
  Administered 2015-02-05: 10 mL
  Filled 2015-02-05: qty 10

## 2015-02-05 MED ORDER — SODIUM CHLORIDE 0.9 % IV SOLN
Freq: Once | INTRAVENOUS | Status: AC
  Administered 2015-02-05: 15:00:00 via INTRAVENOUS

## 2015-02-05 MED ORDER — DIPHENHYDRAMINE HCL 25 MG PO CAPS
ORAL_CAPSULE | ORAL | Status: AC
  Filled 2015-02-05: qty 1

## 2015-02-05 MED ORDER — HEPARIN SOD (PORK) LOCK FLUSH 100 UNIT/ML IV SOLN
500.0000 [IU] | Freq: Once | INTRAVENOUS | Status: AC | PRN
  Administered 2015-02-05: 500 [IU]
  Filled 2015-02-05: qty 5

## 2015-02-05 MED ORDER — DIPHENHYDRAMINE HCL 25 MG PO CAPS
25.0000 mg | ORAL_CAPSULE | Freq: Once | ORAL | Status: AC
  Administered 2015-02-05: 25 mg via ORAL

## 2015-02-05 MED ORDER — TRASTUZUMAB CHEMO INJECTION 440 MG
6.0000 mg/kg | Freq: Once | INTRAVENOUS | Status: AC
  Administered 2015-02-05: 462 mg via INTRAVENOUS
  Filled 2015-02-05: qty 22

## 2015-02-05 NOTE — Progress Notes (Signed)
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 She brought it to her primary physician's attention him a and on 02/21/2014 she underwent bilateral diagnostic mammography with tomography at [facility]. The breast composition was category B. In the left breast at the 3:00 position there was an irregular mass associated with skin retraction. Ultrasound performed on the same day confirmed a 1.1 cm irregular solid mass in the left breast at the 3:00 position. There were no abnormalities by sonography in the left axilla or in the superior portion of the breast, which is for the patient experiences some tenderness.  Biopsy of the left breast area in question 02/22/2014 showed ([id number]) and invasive ductal carcinoma, grade 3, estrogen and progesterone receptor negative, with an MIB-1 of 83%, and HER-2 amplified, the signals ratio being 2.07, and a copy number per cell 4.75.  On 02/28/2014 the patient underwent bilateral breast MRI, which showed in the posterior third of the left breast at the 3:00 position an irregular spiculated mass measuring 1.8 cm. The left breast was unremarkable and there were no  abnormal appearing lymph nodes.  The patient's subsequent history is as detailed below  INTERVAL HISTORY: Stacy Fernandez returns today for follow up of her breast cancer, accompanied by her husband Stacy Fernandez. She is due for trastuzumab today. She tolerates this well with no complaints. The interval history is remarkable for rectal bleeding for the past week. She denies pain, irritation, or constipation, but she does have bright red blood on the toilet paper and in the bowl. She had a colonoscopy last year by Dr. Collene Mares where 4 polyps were removed, per patient.   REVIEW OF SYSTEMS:  Stacy Fernandez denies fevers, chills, nausea, or vomiting. She has lost 5lb since starting and exercise routine. She is lifting weights to regain her strength and is moving up slowly. She hopes to return to work at the beginning of next month. Her energy level is picking up. She has shortness of breath with exertion, but denies chest pain, cough, or palpitations. She continues to use gabapentin and advil for her neuropathy, but says the burning in her feet is getting better. She continues to have regular migraines, but is on several medicines for this. A detailed review of systems is otherwise stable.  PAST MEDICAL HISTORY: Past Medical History  Diagnosis Date  . Hypothyroidism   . Anxiety     Due to situation, CA breast  . Wears glasses   . Wears dentures     upper  . Migraines   . COPD (chronic obstructive pulmonary disease)     denies SOB with ADLs  . Hematuria     cause unknow- saw Urologist  . History of breast cancer 03/2014    left  . History of pneumonia 09/2014  . History of chemotherapy  finished chemo 09/2014  . Neuropathic pain of both feet     due to effects of chemo  . History of gastroesophageal reflux (GERD)     no problems since Nissen fundoplication  . Supraclinoid carotid artery aneurysm, small     left - 2 mm; states is being monitored  . Cancer 02/2014    left breast    PAST SURGICAL HISTORY: Past  Surgical History  Procedure Laterality Date  . Cholecystectomy    . Appendectomy    . Laparoscopic nissen fundoplication  09/2438    for GERD- not a problem  . Video bronchoscopy Bilateral 01/16/2013    Procedure: VIDEO BRONCHOSCOPY WITHOUT FLUORO;  Surgeon: [name], MD;  Location: [facility];  Service: Cardiopulmonary;  Laterality: Bilateral;  . Mastectomy complete / simple w/ sentinel node biopsy Left 04/02/2014    axillary SLN  . Mastectomy complete / simple Right 04/02/2014    PROPHYLACTIC  . Portacath placement Right 04/02/2014  . Total abdominal hysterectomy      partial   . Simple mastectomy with axillary sentinel node biopsy Bilateral 04/02/2014    Procedure: LEFT TOTAL MASTECTOMY WITH LEFT AXILLARY SENTINEL NODE BIOPSY, RIGHT PROPHYLACTIC MASTETCTOMY;  Surgeon: [name], MD;  Location: [place];  Service: General;  Laterality: Bilateral;  . Portacath placement N/A 04/02/2014    Procedure: INSERTION PORT-A-CATH;  Surgeon: [name], MD;  Location: [place];  Service: General;  Laterality: N/A;  . Left heart catheterization with coronary angiogram N/A 12/26/2012    Procedure: LEFT HEART CATHETERIZATION WITH CORONARY ANGIOGRAM;  Surgeon: Lorretta Harp, MD;  Location: [facility] LAB;  Service: Cardiovascular;  Laterality: N/A;  . Breast reconstruction with placement of tissue expander and flex hd (acellular hydrated dermis) Bilateral 09/25/2014    Procedure: PLACEMENT OF BILATERAL TISSUE EXPANDER AND  ACELLULAR DERMIS FOR BREAST RECONSTRUCTION ;  Surgeon: [name], MD;  Location: [place];  Service: Plastics;  Laterality: Bilateral;  . Bilateral salpingoophorectomy    . Cardiac catheterization  12/26/2012    "essentially normal coronary arteries, normal LV function"  . Nissen fundoplication  07/22/7251  . Esophagogastroduodenoscopy (egd) with propofol  01/18/2013  . Removal of bilateral tissue expanders with placement of bilateral breast implants Bilateral  12/18/2014    Procedure: REMOVAL OF BILATERAL TISSUE EXPANDERS,PLACEMENT SILICONE IMPLANTS ;  Surgeon: [name], MD;  Location: [place];  Service: Plastics;  Laterality: Bilateral;  . Liposuction with lipofilling Bilateral 12/18/2014    Procedure: LIPOFILLING TO BILATERAL CHEST;  Surgeon: [name], MD;  Location: [place];  Service: Plastics;  Laterality: Bilateral;    FAMILY HISTORY Family History  Problem Relation Age of Onset  . Heart failure Mother   . Colon cancer Father 1    stomach cancer also in 61s  . Throat cancer Brother 55    smoker  . Heart attack Maternal Grandmother   . Colon cancer Paternal Grandmother 45  . Cancer Paternal Grandfather     kidney and bladder  . Throat cancer Brother 37    throat cancer, smoker  . Ovarian cancer Sister 65    ovarian cancer at 50, colorectal cancer at 15  . Breast cancer Paternal Aunt 21  . Ovarian cancer Other 68    niece with ovarian cancer   the patient's father died at the age of 59 from metastatic stomach cancer the patient's father's mother died from colon cancer at the age of 27. The patient's mother died at  the age of 5. The patient had 2 brothers and 2 sisters. One brother died at the age of 53 from throat cancer metastatic to the lung. He was a smoker. A second brother Was diagnosed at age 81 with throat cancer metastatic to lung. He has been given 90 days to live" is not looking good". One sister died from colon cancer metastatic to bone. One sister died of a drug overdose. There is no history of breast or bearing cancer in the family to the patient's knowledge.  GYNECOLOGIC HISTORY:  No LMP recorded. Patient has had a hysterectomy. Menarche age 43, first live birth age 53, the patient is [place] P4. She underwent hysterectomy with bilateral salpingo-oophorectomy approximately 1990. She took hormone replacement for approximately 2 years.  SOCIAL HISTORY:  She works in a [employer], water, which requires a great deal of manual dexterity and also involves a fair deal of physical activity including lifting.    ADVANCED DIRECTIVES: Not in place   HEALTH MAINTENANCE: History  Substance Use Topics  . Smoking status: Former Smoker -- 0.00 packs/day for 37 years    Quit date: 03/29/2014  . Smokeless tobacco: Never Used     Comment: uses vapor occasionally  . Alcohol Use: No     Colonoscopy: August 2015  PAP: May 2013  Bone density:  Lipid panel:  Allergies  Allergen Reactions  . Contrast Media [Iodinated Diagnostic Agents] Anaphylaxis  . Prednisone Shortness Of Breath and Swelling    CAN TOLERATE IF GIVEN BENADRYL PRIOR     Current Outpatient Prescriptions  Medication Sig Dispense Refill  . buPROPion (WELLBUTRIN XL) 300 MG 24 hr tablet Take 300 mg by mouth daily.    . DUONEB 0.5-2.5 (3) MG/3ML SOLN Inhale 3 mLs into the lungs every 6 (six) hours as needed (for shortness of breath).   3  . furosemide (LASIX) 20 MG tablet Take 0.5 tablets (10 mg total) by mouth daily. 30 tablet 0  . gabapentin (NEURONTIN) 300 MG capsule TAKE 1 CAPSULE (300 MG TOTAL) BY MOUTH 4 (FOUR) TIMES DAILY. (Patient taking differently: TAKE 1 CAPSULE (300 MG TOTAL) BY MOUTH 3 to 4  TIMES DAILY.) 120 capsule 3  . ibuprofen (ADVIL,MOTRIN) 400 MG tablet Take 1 tablet (400 mg total) by mouth every 8 (eight) hours as needed for moderate pain. 60 tablet 0  . levothyroxine (SYNTHROID, LEVOTHROID) 75 MCG tablet Take 125 mcg by mouth daily before breakfast.     . lidocaine-prilocaine (EMLA) cream Apply 1 application topically as needed (for chemo injections).   0  . montelukast (SINGULAIR) 10 MG tablet Take 10 mg by mouth at bedtime.     . potassium chloride SA (K-DUR,KLOR-CON) 20 MEQ tablet Take 1 tablet (20 mEq total) by mouth once. 30 tablet 1  . rizatriptan (MAXALT-MLT) 5 MG disintegrating tablet TAKE 1 TABLET (5 MG TOTAL) BY MOUTH AS NEEDED FOR MIGRAINE. MAY REPEAT IN 2 HOURS IF NEEDED 15 tablet 5    . Trastuzumab (HERCEPTIN IV) Inject 462 mg into the vein every 21 ( twenty-one) days.    . traZODone (DESYREL) 100 MG tablet TAKE 1 TABLET (100 MG TOTAL) BY MOUTH AT BEDTIME. 30 tablet 1  . verapamil (CALAN-SR) 120 MG CR tablet TAKE 1 TABLET BY MOUTH AT BEDTIME ( MAX 30 DAY SUPPLY PER INSURANCE) 30 tablet 6  . LORazepam (ATIVAN) 0.5 MG tablet Take 1 tablet (0.5 mg total) by mouth at bedtime as needed (Nausea or vomiting). (Patient not taking: Reported on 02/05/2015) 30  tablet 0  . oxyCODONE (OXY IR/ROXICODONE) 5 MG immediate release tablet Take 1-2 tablets (5-10 mg total) by mouth every 4 (four) hours as needed for moderate pain. (Patient not taking: Reported on 02/05/2015) 50 tablet 0  . PROAIR HFA 108 (90 BASE) MCG/ACT inhaler Inhale 2 puffs into the lungs every 4 (four) hours as needed for wheezing or shortness of breath.     . senna-docusate (SENOKOT-S) 8.6-50 MG per tablet Take 1 tablet by mouth at bedtime as needed for mild constipation. (Patient not taking: Reported on 02/05/2015) 20 tablet 0  . SUMAtriptan (IMITREX) 6 MG/0.5ML SOLN injection Inject 6 mg into the skin every 2 (two) hours as needed for migraine or headache. May repeat in 2 hours if headache persists or recurs. Max 2 doses in 24 hours     No current facility-administered medications for this visit.   Facility-Administered Medications Ordered in Other Visits  Medication Dose Route Frequency Provider Last Rate Last Dose  . 0.9 %  sodium chloride infusion   Intravenous Once [name], MD      . acetaminophen (TYLENOL) tablet 650 mg  650 mg Oral Once [name], MD      . diphenhydrAMINE (BENADRYL) capsule 25 mg  25 mg Oral Once [name], MD      . heparin lock flush 100 unit/mL  500 Units Intracatheter Once PRN [name], MD      . sodium chloride 0.9 % injection 10 mL  10 mL Intracatheter PRN [name], MD      . trastuzumab (HERCEPTIN) 462 mg in sodium chloride 0.9 % 250 mL chemo infusion   6 mg/kg (Treatment Plan Actual) Intravenous Once [name], MD        OBJECTIVE: Middle-aged white woman who appears stated age 22 Vitals:   02/05/15 1410  BP: 100/50  Pulse: 92  Temp: 97.7 F (36.5 C)  Resp: 18     Body mass index is 27.73 kg/(m^2).    ECOG FS:1 - Symptomatic but completely ambulatory   Skin: warm, dry  HEENT: sclerae anicteric, conjunctivae pink, oropharynx clear. No thrush or mucositis.  Lymph Nodes: No cervical or supraclavicular lymphadenopathy  Lungs: clear to auscultation bilaterally, no rales, wheezes, or rhonci  Heart: regular rate and rhythm  Abdomen: round, soft, non tender, positive bowel sounds  Musculoskeletal: No focal spinal tenderness, no peripheral edema  Neuro: non focal, well oriented, positive affect  Breasts: bilateral breasts status post mastectomies and implants. No evidence of recurrent disease. Bilateral axillae benign.  LAB RESULTS:  CMP     Component Value Date/Time   NA 141 02/05/2015 1400   NA 141 10/01/2014 0720   K 4.3 02/05/2015 1400   K 3.1* 10/01/2014 0720   CL 108 10/01/2014 0720   CO2 23 02/05/2015 1400   CO2 25 10/01/2014 0720   GLUCOSE 86 02/05/2015 1400   GLUCOSE 81 10/01/2014 0720   BUN 20.6 02/05/2015 1400   BUN 7 10/01/2014 0720   CREATININE 1.3* 02/05/2015 1400   CREATININE 0.84 10/01/2014 0720   CALCIUM 9.6 02/05/2015 1400   CALCIUM 8.7 10/01/2014 0720   PROT 7.1 02/05/2015 1400   PROT 6.1 09/28/2014 2024   ALBUMIN 4.2 02/05/2015 1400   ALBUMIN 3.0* 09/28/2014 2024   AST 13 02/05/2015 1400   AST 14 09/28/2014 2024   ALT 12 02/05/2015 1400   ALT 10 09/28/2014 2024   ALKPHOS 114 02/05/2015 1400   ALKPHOS 80 09/28/2014 2024  BILITOT 0.52 02/05/2015 1400   BILITOT 0.6 09/28/2014 2024   GFRNONAA 78* 10/01/2014 0720   GFRAA >90 10/01/2014 0720    I No results found for: SPEP  Lab Results  Component Value Date   WBC 3.9 02/05/2015   NEUTROABS 2.5 02/05/2015   HGB 11.7 02/05/2015   HCT  34.4* 02/05/2015   MCV 89.3 02/05/2015   PLT 208 02/05/2015      Chemistry      Component Value Date/Time   NA 141 02/05/2015 1400   NA 141 10/01/2014 0720   K 4.3 02/05/2015 1400   K 3.1* 10/01/2014 0720   CL 108 10/01/2014 0720   CO2 23 02/05/2015 1400   CO2 25 10/01/2014 0720   BUN 20.6 02/05/2015 1400   BUN 7 10/01/2014 0720   CREATININE 1.3* 02/05/2015 1400   CREATININE 0.84 10/01/2014 0720      Component Value Date/Time   CALCIUM 9.6 02/05/2015 1400   CALCIUM 8.7 10/01/2014 0720   ALKPHOS 114 02/05/2015 1400   ALKPHOS 80 09/28/2014 2024   AST 13 02/05/2015 1400   AST 14 09/28/2014 2024   ALT 12 02/05/2015 1400   ALT 10 09/28/2014 2024   BILITOT 0.52 02/05/2015 1400   BILITOT 0.6 09/28/2014 2024       No results found for: LABCA2  No components found for: LABCA125  No results for input(s): INR in the last 168 hours.  Urinalysis    Component Value Date/Time   COLORURINE YELLOW 09/29/2014 0119   APPEARANCEUR CLEAR 09/29/2014 0119   LABSPEC 1.010 09/29/2014 0119   LABSPEC 1.010 07/10/2014 1004   PHURINE 5.5 09/29/2014 0119   PHURINE 6.0 07/10/2014 1004   GLUCOSEU 100* 09/29/2014 0119   GLUCOSEU Negative 07/10/2014 1004   HGBUR MODERATE* 09/29/2014 0119   HGBUR Moderate 07/10/2014 1004   BILIRUBINUR NEGATIVE 09/29/2014 0119   BILIRUBINUR Negative 07/10/2014 1004   KETONESUR NEGATIVE 09/29/2014 0119   KETONESUR Negative 07/10/2014 1004   PROTEINUR NEGATIVE 09/29/2014 0119   PROTEINUR Negative 07/10/2014 1004   UROBILINOGEN 1.0 09/29/2014 0119   UROBILINOGEN 0.2 07/10/2014 1004   NITRITE NEGATIVE 09/29/2014 0119   NITRITE Negative 07/10/2014 1004   LEUKOCYTESUR NEGATIVE 09/29/2014 0119   LEUKOCYTESUR Negative 07/10/2014 1004    STUDIES: Most recent echocardiogram on 02/04/15 showed an EF of 50-55%  ASSESSMENT: 54 y.o. BRCA negative [place] woman status post left breast biopsy 02/22/2014 for a clinical T2/T3 NX, stage II or III invasive ductal  carcinoma, grade 3, estrogen and progesterone receptor negative, with an MIB-1 of 83%, and HER-2 amplified with a signals a ratio of 2.07and a copy number per cell of 4.75  (1) biopsy of an additional area of enhancement in the left breast 03/08/2014 showed ductal carcinoma in situ, estrogen and progesterone receptor negative.  (2) status post bilateral mastectomies with left sentinel lymph node sampling 04/02/2014, showing:  (a) on the right, benign breast tissue including a single negative lymph node  (b) On  the left, a  pT1c pN0, stage IA invasive ductal carcinoma, grade 3, with negative margins  (3) adjuvant chemotherapy started 05/08/2014, consisting of carboplatin and docetaxel given every 3 weeks x6, together with trastuzumab, with neulasta day 2   (a) docetaxel removed from final cycle 08/21/2014 because of persistent neuropathy symptoms  (4) trastuzumab to be continued to complete a year (through OCT 2016)   (a) echo 02/04/15 showed a well-preserved ejection fraction  (5) reconstruction with bilateral silicone implants 77/93/9030 ([place])  (6) tobacco abuse:  The patient quit smoking 03/30/2014  (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 The labs were reviewed in detail and were stable. She will proceed with her next cycle of trastuzumab as planned today. She had an echocardiogram yesterday that showed a well preserved ejection fraction. Her next one will be due in October.   I have placed a referral to Dr. Collene Mares regarding her rectal bleeding. This was the physician that performed her colonoscopy last year and removed 4 polyps. Besides the bleeding she does not give me any indications of hemorrhoids, so I see no cause to prescribe any creams or  suppositories.   I refilled her ibuprofen which she is using for her surgical pain and residual neuropathy, though this is improving.   Stacy Fernandez will continue trastuzumab every 3 weeks through October. On this final infusion day she will meet with Dr. Jana Hakim. She understands and agrees with this plan. She knows the goal of treatment in her case is cure. She has been encouraged to call with any issues that might arise before her next visit here.  Laurie Panda, NP   02/05/2015 3:29 PM

## 2015-02-05 NOTE — Patient Instructions (Signed)
[facility] Discharge Instructions for Patients Receiving Chemotherapy  Today you received the following chemotherapy agents: Herceptin   To help prevent nausea and vomiting after your treatment, we encourage you to take your nausea medication as directed.    If you develop nausea and vomiting that is not controlled by your nausea medication, call the clinic.   BELOW ARE SYMPTOMS THAT SHOULD BE REPORTED IMMEDIATELY:  *FEVER GREATER THAN 100.5 F  *CHILLS WITH OR WITHOUT FEVER  NAUSEA AND VOMITING THAT IS NOT CONTROLLED WITH YOUR NAUSEA MEDICATION  *UNUSUAL SHORTNESS OF BREATH  *UNUSUAL BRUISING OR BLEEDING  TENDERNESS IN MOUTH AND THROAT WITH OR WITHOUT PRESENCE OF ULCERS  *URINARY PROBLEMS  *BOWEL PROBLEMS  UNUSUAL RASH Items with * indicate a potential emergency and should be followed up as soon as possible.  Feel free to call the clinic you have any questions or concerns. The clinic phone number is [phone].  Please show the CHEMO ALERT CARD at check-in to the Emergency Department and triage nurse.   

## 2015-02-07 ENCOUNTER — Encounter: Payer: Self-pay | Admitting: Oncology

## 2015-02-07 NOTE — Progress Notes (Signed)
I faxed form to [employer] [phone]

## 2015-02-11 ENCOUNTER — Other Ambulatory Visit: Payer: Self-pay | Admitting: *Deleted

## 2015-02-13 ENCOUNTER — Other Ambulatory Visit: Payer: Self-pay | Admitting: Oncology

## 2015-02-18 ENCOUNTER — Emergency Department (HOSPITAL_COMMUNITY): Payer: BLUE CROSS/BLUE SHIELD

## 2015-02-18 ENCOUNTER — Encounter (HOSPITAL_COMMUNITY): Payer: Self-pay | Admitting: Emergency Medicine

## 2015-02-18 ENCOUNTER — Emergency Department (HOSPITAL_COMMUNITY)
Admission: EM | Admit: 2015-02-18 | Discharge: 2015-02-18 | Disposition: A | Discharge status: Home / Self Care | Payer: BLUE CROSS/BLUE SHIELD | Attending: Emergency Medicine | Admitting: Emergency Medicine

## 2015-02-18 DIAGNOSIS — Z8719 Personal history of other diseases of the digestive system: Secondary | ICD-10-CM | POA: Diagnosis not present

## 2015-02-18 DIAGNOSIS — Z9889 Other specified postprocedural states: Secondary | ICD-10-CM | POA: Diagnosis not present

## 2015-02-18 DIAGNOSIS — G43909 Migraine, unspecified, not intractable, without status migrainosus: Secondary | ICD-10-CM | POA: Insufficient documentation

## 2015-02-18 DIAGNOSIS — J449 Chronic obstructive pulmonary disease, unspecified: Secondary | ICD-10-CM | POA: Insufficient documentation

## 2015-02-18 DIAGNOSIS — Z87891 Personal history of nicotine dependence: Secondary | ICD-10-CM | POA: Diagnosis not present

## 2015-02-18 DIAGNOSIS — Z9221 Personal history of antineoplastic chemotherapy: Secondary | ICD-10-CM | POA: Diagnosis not present

## 2015-02-18 DIAGNOSIS — G629 Polyneuropathy, unspecified: Secondary | ICD-10-CM | POA: Insufficient documentation

## 2015-02-18 DIAGNOSIS — E039 Hypothyroidism, unspecified: Secondary | ICD-10-CM | POA: Insufficient documentation

## 2015-02-18 DIAGNOSIS — Z79899 Other long term (current) drug therapy: Secondary | ICD-10-CM | POA: Diagnosis not present

## 2015-02-18 DIAGNOSIS — F419 Anxiety disorder, unspecified: Secondary | ICD-10-CM | POA: Insufficient documentation

## 2015-02-18 DIAGNOSIS — Z8701 Personal history of pneumonia (recurrent): Secondary | ICD-10-CM | POA: Diagnosis not present

## 2015-02-18 DIAGNOSIS — R109 Unspecified abdominal pain: Secondary | ICD-10-CM

## 2015-02-18 DIAGNOSIS — Z853 Personal history of malignant neoplasm of breast: Secondary | ICD-10-CM | POA: Diagnosis not present

## 2015-02-18 DIAGNOSIS — D649 Anemia, unspecified: Secondary | ICD-10-CM | POA: Diagnosis not present

## 2015-02-18 DIAGNOSIS — N289 Disorder of kidney and ureter, unspecified: Secondary | ICD-10-CM

## 2015-02-18 DIAGNOSIS — R1011 Right upper quadrant pain: Secondary | ICD-10-CM | POA: Diagnosis present

## 2015-02-18 LAB — COMPREHENSIVE METABOLIC PANEL
ALK PHOS: 96 U/L (ref 38–126)
ALT: 17 U/L (ref 14–54)
AST: 21 U/L (ref 15–41)
Albumin: 4 g/dL (ref 3.5–5.0)
Anion gap: 8 (ref 5–15)
BILIRUBIN TOTAL: 0.3 mg/dL (ref 0.3–1.2)
BUN: 15 mg/dL (ref 6–20)
CALCIUM: 9 mg/dL (ref 8.9–10.3)
CO2: 20 mmol/L — ABNORMAL LOW (ref 22–32)
CREATININE: 1.02 mg/dL — AB (ref 0.44–1.00)
Chloride: 110 mmol/L (ref 101–111)
GFR calc non Af Amer: >60 mL/min (ref >60)
GLUCOSE: 114 mg/dL — AB (ref 65–99)
POTASSIUM: 3.7 mmol/L (ref 3.5–5.1)
Sodium: 138 mmol/L (ref 135–145)
TOTAL PROTEIN: 6.8 g/dL (ref 6.5–8.1)

## 2015-02-18 LAB — URINE MICROSCOPIC-ADD ON

## 2015-02-18 LAB — I-STAT TROPONIN, ED: Troponin i, poc: 0 ng/mL (ref 0.00–0.08)

## 2015-02-18 LAB — DIFFERENTIAL
BASOS PCT: 1 % (ref 0–1)
Basophils Absolute: 0 10*3/uL (ref 0.0–0.1)
EOS ABS: 0.1 10*3/uL (ref 0.0–0.7)
Eosinophils Relative: 1 % (ref 0–5)
LYMPHS ABS: 1 10*3/uL (ref 0.7–4.0)
LYMPHS PCT: 24 % (ref 12–46)
MONO ABS: 0.4 10*3/uL (ref 0.1–1.0)
Monocytes Relative: 9 % (ref 3–12)
Neutro Abs: 2.8 10*3/uL (ref 1.7–7.7)
Neutrophils Relative %: 65 % (ref 43–77)

## 2015-02-18 LAB — URINALYSIS, ROUTINE W REFLEX MICROSCOPIC
Bilirubin Urine: NEGATIVE
Glucose, UA: NEGATIVE mg/dL
KETONES UR: NEGATIVE mg/dL
Nitrite: NEGATIVE
PH: 5 (ref 5.0–8.0)
Protein, ur: NEGATIVE mg/dL
SPECIFIC GRAVITY, URINE: 1.018 (ref 1.005–1.030)
Urobilinogen, UA: 0.2 mg/dL (ref 0.0–1.0)

## 2015-02-18 LAB — CBC
HCT: 31 % — ABNORMAL LOW (ref 36.0–46.0)
Hemoglobin: 10.6 g/dL — ABNORMAL LOW (ref 12.0–15.0)
MCH: 30.8 pg (ref 26.0–34.0)
MCHC: 34.2 g/dL (ref 30.0–36.0)
MCV: 90.1 fL (ref 78.0–100.0)
Platelets: 171 10*3/uL (ref 150–400)
RBC: 3.44 MIL/uL — ABNORMAL LOW (ref 3.87–5.11)
RDW: 13.2 % (ref 11.5–15.5)
WBC: 4.3 10*3/uL (ref 4.0–10.5)

## 2015-02-18 LAB — I-STAT CG4 LACTIC ACID, ED: Lactic Acid, Venous: 1.7 mmol/L (ref 0.5–2.0)

## 2015-02-18 LAB — LIPASE, BLOOD: Lipase: 19 U/L — ABNORMAL LOW (ref 22–51)

## 2015-02-18 MED ORDER — HYDROMORPHONE HCL 1 MG/ML IJ SOLN
1.0000 mg | Freq: Once | INTRAMUSCULAR | Status: AC
  Administered 2015-02-18: 1 mg via INTRAVENOUS
  Filled 2015-02-18: qty 1

## 2015-02-18 MED ORDER — ONDANSETRON HCL 4 MG PO TABS: 4.0000 mg | ORAL_TABLET | Freq: Four times a day (QID) | ORAL | Status: DC | PRN

## 2015-02-18 MED ORDER — OXYCODONE-ACETAMINOPHEN 5-325 MG PO TABS: 1.0000 | ORAL_TABLET | ORAL | Status: DC | PRN

## 2015-02-18 MED ORDER — SODIUM CHLORIDE 0.9 % IV BOLUS (SEPSIS)
1000.0000 mL | Freq: Once | INTRAVENOUS | Status: AC
  Administered 2015-02-18: 1000 mL via INTRAVENOUS

## 2015-02-18 MED ORDER — ONDANSETRON HCL 4 MG/2ML IJ SOLN
4.0000 mg | Freq: Once | INTRAMUSCULAR | Status: AC
Start: 2015-02-18 — End: 2015-02-18
  Administered 2015-02-18: 4 mg via INTRAVENOUS
  Filled 2015-02-18: qty 2

## 2015-02-18 MED ORDER — GI COCKTAIL ~~LOC~~
30.0000 mL | Freq: Once | ORAL | Status: AC
  Administered 2015-02-18: 30 mL via ORAL
  Filled 2015-02-18: qty 30

## 2015-02-18 MED ORDER — IOHEXOL 300 MG/ML  SOLN
25.0000 mL | Freq: Once | INTRAMUSCULAR | Status: AC | PRN
  Administered 2015-02-18: 25 mL via ORAL

## 2015-02-18 MED ORDER — PANTOPRAZOLE SODIUM 40 MG IV SOLR
40.0000 mg | Freq: Once | INTRAVENOUS | Status: AC
  Administered 2015-02-18: 40 mg via INTRAVENOUS
  Filled 2015-02-18: qty 40

## 2015-02-18 MED ORDER — SODIUM CHLORIDE 0.9 % IV SOLN
1000.0000 mL | Freq: Once | INTRAVENOUS | Status: AC
  Administered 2015-02-18: 1000 mL via INTRAVENOUS

## 2015-02-18 MED ORDER — SODIUM CHLORIDE 0.9 % IV SOLN
1000.0000 mL | INTRAVENOUS | Status: DC
  Administered 2015-02-18: 1000 mL via INTRAVENOUS

## 2015-02-18 MED ORDER — HYDROMORPHONE HCL 1 MG/ML IJ SOLN
0.5000 mg | Freq: Once | INTRAMUSCULAR | Status: AC
  Administered 2015-02-18: 1 mg via INTRAVENOUS
  Filled 2015-02-18: qty 1

## 2015-02-18 NOTE — ED Notes (Signed)
Pt reports taking oxycodone at 1230 without relief. Pt reports right lower abdominal pain that radiates to epigastric area. She reports bloody stools. She reports nausea but is unable to vomiting due to procedure for acid reflux.

## 2015-02-18 NOTE — ED Notes (Signed)
Pt reports that she began having epigastric pain radiating from RUQ. Pt reports nausea, but is unable to vomit. Pt denies diarrhea, reports "cherry red" blood in stools x4 weeks and is scheduled for a colonoscopy and endoscopy. Pt currently receiving chemo for breast CA, last 7/19. Pt states pain unbearable even with home pain medications.

## 2015-02-18 NOTE — ED Notes (Signed)
MD Glick at bedside. 

## 2015-02-18 NOTE — ED Notes (Signed)
Nurse starting IV 

## 2015-02-18 NOTE — Discharge Instructions (Signed)
Your evaluation did not show the cause for your pain. Please return if symptoms are worsening. Otherwise, go for your endoscopy in 2 days as scheduled. Your gastroenterologist may adjust your treatment based on what is found at endoscopy.   Abdominal Pain Many things can cause abdominal pain. Usually, abdominal pain is not caused by a disease and will improve without treatment. It can often be observed and treated at home. Your health care provider will do a physical exam and possibly order blood tests and X-rays to help determine the seriousness of your pain. However, in many cases, more time must pass before a clear cause of the pain can be found. Before that point, your health care provider may not know if you need more testing or further treatment. HOME CARE INSTRUCTIONS  Monitor your abdominal pain for any changes. The following actions may help to alleviate any discomfort you are experiencing:  Only take over-the-counter or prescription medicines as directed by your health care provider.  Do not take laxatives unless directed to do so by your health care provider.  Try a clear liquid diet (broth, tea, or water) as directed by your health care provider. Slowly move to a bland diet as tolerated. SEEK MEDICAL CARE IF:  You have unexplained abdominal pain.  You have abdominal pain associated with nausea or diarrhea.  You have pain when you urinate or have a bowel movement.  You experience abdominal pain that wakes you in the night.  You have abdominal pain that is worsened or improved by eating food.  You have abdominal pain that is worsened with eating fatty foods.  You have a fever. SEEK IMMEDIATE MEDICAL CARE IF:   Your pain does not go away within 2 hours.  You keep throwing up (vomiting).  Your pain is felt only in portions of the abdomen, such as the right side or the left lower portion of the abdomen.  You pass bloody or black tarry stools. MAKE SURE YOU:  Understand  these instructions.   Will watch your condition.   Will get help right away if you are not doing well or get worse.  Document Released: 04/15/2005 Document Revised: 07/11/2013 Document Reviewed: 03/15/2013 [facility] Patient Information 2015 [place], [place]. This information is not intended to replace advice given to you by your health care provider. Make sure you discuss any questions you have with your health care provider.  Ondansetron tablets What is this medicine? ONDANSETRON (on DAN se tron) is used to treat nausea and vomiting caused by chemotherapy. It is also used to prevent or treat nausea and vomiting after surgery. This medicine may be used for other purposes; ask your health care provider or pharmacist if you have questions. COMMON BRAND NAME(S): Zofran What should I tell my health care provider before I take this medicine? They need to know if you have any of these conditions: -heart disease -history of irregular heartbeat -liver disease -low levels of magnesium or potassium in the blood -an unusual or allergic reaction to ondansetron, granisetron, other medicines, foods, dyes, or preservatives -pregnant or trying to get pregnant -breast-feeding How should I use this medicine? Take this medicine by mouth with a glass of water. Follow the directions on your prescription label. Take your doses at regular intervals. Do not take your medicine more often than directed. Talk to your pediatrician regarding the use of this medicine in children. Special care may be needed. Overdosage: If you think you have taken too much of this medicine contact a  poison control center or emergency room at once. NOTE: This medicine is only for you. Do not share this medicine with others. What if I miss a dose? If you miss a dose, take it as soon as you can. If it is almost time for your next dose, take only that dose. Do not take double or extra doses. What may interact with this medicine? Do not  take this medicine with any of the following medications: -apomorphine -certain medicines for fungal infections like fluconazole, itraconazole, ketoconazole, posaconazole, voriconazole -cisapride -dofetilide -dronedarone -pimozide -thioridazine -ziprasidone This medicine may also interact with the following medications: -carbamazepine -certain medicines for depression, anxiety, or psychotic disturbances -fentanyl -linezolid -MAOIs like Carbex, Eldepryl, Marplan, Nardil, and Parnate -methylene blue (injected into a vein) -other medicines that prolong the QT interval (cause an abnormal heart rhythm) -phenytoin -rifampicin -tramadol This list may not describe all possible interactions. Give your health care provider a list of all the medicines, herbs, non-prescription drugs, or dietary supplements you use. Also tell them if you smoke, drink alcohol, or use illegal drugs. Some items may interact with your medicine. What should I watch for while using this medicine? Check with your doctor or health care professional right away if you have any sign of an allergic reaction. What side effects may I notice from receiving this medicine? Side effects that you should report to your doctor or health care professional as soon as possible: -allergic reactions like skin rash, itching or hives, swelling of the face, lips or tongue -breathing problems -confusion -dizziness -fast or irregular heartbeat -feeling faint or lightheaded, falls -fever and chills -loss of balance or coordination -seizures -sweating -swelling of the hands or feet -tightness in the chest -tremors -unusually weak or tired Side effects that usually do not require medical attention (report to your doctor or health care professional if they continue or are bothersome): -constipation or diarrhea -headache This list may not describe all possible side effects. Call your doctor for medical advice about side effects. You may  report side effects to FDA at 1-800-FDA-1088. Where should I keep my medicine? Keep out of the reach of children. Store between 2 and 30 degrees C (36 and 86 degrees F). Throw away any unused medicine after the expiration date. NOTE: This sheet is a summary. It may not cover all possible information. If you have questions about this medicine, talk to your doctor, pharmacist, or health care provider.  2015, [employer]. (2013-04-12 16:27:45)  Acetaminophen; Oxycodone tablets What is this medicine? ACETAMINOPHEN; OXYCODONE (a set a MEE noe fen; ox i KOE done) is a pain reliever. It is used to treat mild to moderate pain. This medicine may be used for other purposes; ask your health care provider or pharmacist if you have questions. COMMON BRAND NAME(S): Endocet, Magnacet, Narvox, Percocet, Perloxx, Primalev, Primlev, Roxicet, Xolox What should I tell my health care provider before I take this medicine? They need to know if you have any of these conditions: -brain tumor -Crohn's disease, inflammatory bowel disease, or ulcerative colitis -drug abuse or addiction -head injury -heart or circulation problems -if you often drink alcohol -kidney disease or problems going to the bathroom -liver disease -lung disease, asthma, or breathing problems -an unusual or allergic reaction to acetaminophen, oxycodone, other opioid analgesics, other medicines, foods, dyes, or preservatives -pregnant or trying to get pregnant -breast-feeding How should I use this medicine? Take this medicine by mouth with a full glass of water. Follow the directions on the prescription label.  Take your medicine at regular intervals. Do not take your medicine more often than directed. Talk to your pediatrician regarding the use of this medicine in children. Special care may be needed. Patients over 6 years old may have a stronger reaction and need a smaller dose. Overdosage: If you think you have taken too much of  this medicine contact a poison control center or emergency room at once. NOTE: This medicine is only for you. Do not share this medicine with others. What if I miss a dose? If you miss a dose, take it as soon as you can. If it is almost time for your next dose, take only that dose. Do not take double or extra doses. What may interact with this medicine? -alcohol -antihistamines -barbiturates like amobarbital, butalbital, butabarbital, methohexital, pentobarbital, phenobarbital, thiopental, and secobarbital -benztropine -drugs for bladder problems like solifenacin, trospium, oxybutynin, tolterodine, hyoscyamine, and methscopolamine -drugs for breathing problems like ipratropium and tiotropium -drugs for certain stomach or intestine problems like propantheline, homatropine methylbromide, glycopyrrolate, atropine, belladonna, and dicyclomine -general anesthetics like etomidate, ketamine, nitrous oxide, propofol, desflurane, enflurane, halothane, isoflurane, and sevoflurane -medicines for depression, anxiety, or psychotic disturbances -medicines for sleep -muscle relaxants -naltrexone -narcotic medicines (opiates) for pain -phenothiazines like perphenazine, thioridazine, chlorpromazine, mesoridazine, fluphenazine, prochlorperazine, promazine, and trifluoperazine -scopolamine -tramadol -trihexyphenidyl This list may not describe all possible interactions. Give your health care provider a list of all the medicines, herbs, non-prescription drugs, or dietary supplements you use. Also tell them if you smoke, drink alcohol, or use illegal drugs. Some items may interact with your medicine. What should I watch for while using this medicine? Tell your doctor or health care professional if your pain does not go away, if it gets worse, or if you have new or a different type of pain. You may develop tolerance to the medicine. Tolerance means that you will need a higher dose of the medication for pain relief.  Tolerance is normal and is expected if you take this medicine for a long time. Do not suddenly stop taking your medicine because you may develop a severe reaction. Your body becomes used to the medicine. This does NOT mean you are addicted. Addiction is a behavior related to getting and using a drug for a non-medical reason. If you have pain, you have a medical reason to take pain medicine. Your doctor will tell you how much medicine to take. If your doctor wants you to stop the medicine, the dose will be slowly lowered over time to avoid any side effects. You may get drowsy or dizzy. Do not drive, use machinery, or do anything that needs mental alertness until you know how this medicine affects you. Do not stand or sit up quickly, especially if you are an older patient. This reduces the risk of dizzy or fainting spells. Alcohol may interfere with the effect of this medicine. Avoid alcoholic drinks. There are different types of narcotic medicines (opiates) for pain. If you take more than one type at the same time, you may have more side effects. Give your health care provider a list of all medicines you use. Your doctor will tell you how much medicine to take. Do not take more medicine than directed. Call emergency for help if you have problems breathing. The medicine will cause constipation. Try to have a bowel movement at least every 2 to 3 days. If you do not have a bowel movement for 3 days, call your doctor or health care professional. Do not take Tylenol (  acetaminophen) or medicines that have acetaminophen with this medicine. Too much acetaminophen can be very dangerous. Many nonprescription medicines contain acetaminophen. Always read the labels carefully to avoid taking more acetaminophen. What side effects may I notice from receiving this medicine? Side effects that you should report to your doctor or health care professional as soon as possible: -allergic reactions like skin rash, itching or hives,  swelling of the face, lips, or tongue -breathing difficulties, wheezing -confusion -light headedness or fainting spells -severe stomach pain -unusually weak or tired -yellowing of the skin or the whites of the eyes Side effects that usually do not require medical attention (report to your doctor or health care professional if they continue or are bothersome): -dizziness -drowsiness -nausea -vomiting This list may not describe all possible side effects. Call your doctor for medical advice about side effects. You may report side effects to FDA at 1-800-FDA-1088. Where should I keep my medicine? Keep out of the reach of children. This medicine can be abused. Keep your medicine in a safe place to protect it from theft. Do not share this medicine with anyone. Selling or giving away this medicine is dangerous and against the law. Store at room temperature between 20 and 25 degrees C (68 and 77 degrees F). Keep container tightly closed. Protect from light. This medicine may cause accidental overdose and death if it is taken by other adults, children, or pets. Flush any unused medicine down the toilet to reduce the chance of harm. Do not use the medicine after the expiration date. NOTE: This sheet is a summary. It may not cover all possible information. If you have questions about this medicine, talk to your doctor, pharmacist, or health care provider.  2015, [employer]. (2013-02-27 13:17:35)

## 2015-02-18 NOTE — ED Notes (Signed)
Patient Systolic is 89. MD Roxanne Mins made aware. He gives this RN a verbal order for another 1 L NS bolus

## 2015-02-18 NOTE — ED Notes (Signed)
Pt up, walking around ER. Called contact number for spouse to alert them for need for pick up, no answer, left a voicemail, will attempt again shortly.

## 2015-02-18 NOTE — ED Notes (Signed)
Pt made aware of the need for a urine sample. °

## 2015-02-18 NOTE — ED Provider Notes (Signed)
CSN: [id number]     Arrival date & time 02/18/15  0224 History   First MD Initiated Contact with Patient 02/18/15 [phone]     Chief Complaint  Patient presents with  . Abdominal Pain     (Consider location/radiation/quality/duration/timing/severity/associated sxs/prior Treatment) Patient is a 54 y.o. female presenting with abdominal pain. The history is provided by the patient.  Abdominal Pain She had sudden onset about midnight of pain in the right mid and upper abdomen which is radiating to the epigastric area and some radiation through to the back. There is associated nausea but no vomiting. She rates pain at 10/10. Pain is worse with movement and deep breathing. Nothing makes it any better. She has taken oxycodone at home with no relief. She is currently undergoing chemotherapy for breast cancer. She has not had pain like this before. She does relate that she has been having blood in her stools for several weeks and is scheduled for colonoscopy in 2 days. She is status post cholecystectomy and appendectomy.  Past Medical History  Diagnosis Date  . Hypothyroidism   . Anxiety     Due to situation, CA breast  . Wears glasses   . Wears dentures     upper  . Migraines   . COPD (chronic obstructive pulmonary disease)     denies SOB with ADLs  . Hematuria     cause unknow- saw Urologist  . History of breast cancer 03/2014    left  . History of pneumonia 09/2014  . History of chemotherapy     finished chemo 09/2014  . Neuropathic pain of both feet     due to effects of chemo  . History of gastroesophageal reflux (GERD)     no problems since Nissen fundoplication  . Supraclinoid carotid artery aneurysm, small     left - 2 mm; states is being monitored  . Cancer 02/2014    left breast   Past Surgical History  Procedure Laterality Date  . Cholecystectomy    . Appendectomy    . Laparoscopic nissen fundoplication  01/176    for GERD- not a problem  . Video bronchoscopy Bilateral  01/16/2013    Procedure: VIDEO BRONCHOSCOPY WITHOUT FLUORO;  Surgeon: [name], MD;  Location: [facility];  Service: Cardiopulmonary;  Laterality: Bilateral;  . Mastectomy complete / simple w/ sentinel node biopsy Left 04/02/2014    axillary SLN  . Mastectomy complete / simple Right 04/02/2014    PROPHYLACTIC  . Portacath placement Right 04/02/2014  . Total abdominal hysterectomy      partial   . Simple mastectomy with axillary sentinel node biopsy Bilateral 04/02/2014    Procedure: LEFT TOTAL MASTECTOMY WITH LEFT AXILLARY SENTINEL NODE BIOPSY, RIGHT PROPHYLACTIC MASTETCTOMY;  Surgeon: [name], MD;  Location: [place];  Service: General;  Laterality: Bilateral;  . Portacath placement N/A 04/02/2014    Procedure: INSERTION PORT-A-CATH;  Surgeon: [name], MD;  Location: [place];  Service: General;  Laterality: N/A;  . Left heart catheterization with coronary angiogram N/A 12/26/2012    Procedure: LEFT HEART CATHETERIZATION WITH CORONARY ANGIOGRAM;  Surgeon: Lorretta Harp, MD;  Location: [facility] CATH LAB;  Service: Cardiovascular;  Laterality: N/A;  . Breast reconstruction with placement of tissue expander and flex hd (acellular hydrated dermis) Bilateral 09/25/2014    Procedure: PLACEMENT OF BILATERAL TISSUE EXPANDER AND  ACELLULAR DERMIS FOR BREAST RECONSTRUCTION ;  Surgeon: [name], MD;  Location: [place];  Service: Plastics;  Laterality: Bilateral;  .  Bilateral salpingoophorectomy    . Cardiac catheterization  12/26/2012    "essentially normal coronary arteries, normal LV function"  . Nissen fundoplication  11/22/3974  . Esophagogastroduodenoscopy (egd) with propofol  01/18/2013  . Removal of bilateral tissue expanders with placement of bilateral breast implants Bilateral 12/18/2014    Procedure: REMOVAL OF BILATERAL TISSUE EXPANDERS,PLACEMENT SILICONE IMPLANTS ;  Surgeon: [name], MD;  Location: [place];  Service: Plastics;  Laterality:  Bilateral;  . Liposuction with lipofilling Bilateral 12/18/2014    Procedure: LIPOFILLING TO BILATERAL CHEST;  Surgeon: [name], MD;  Location: [place];  Service: Plastics;  Laterality: Bilateral;   Family History  Problem Relation Age of Onset  . Heart failure Mother   . Colon cancer Father 36    stomach cancer also in 20s  . Throat cancer Brother 67    smoker  . Heart attack Maternal Grandmother   . Colon cancer Paternal Grandmother 53  . Cancer Paternal Grandfather     kidney and bladder  . Throat cancer Brother 37    throat cancer, smoker  . Ovarian cancer Sister 38    ovarian cancer at 53, colorectal cancer at 78  . Breast cancer Paternal Aunt 64  . Ovarian cancer Other 11    niece with ovarian cancer   History  Substance Use Topics  . Smoking status: Former Smoker -- 0.00 packs/day for 37 years    Quit date: 03/29/2014  . Smokeless tobacco: Never Used     Comment: uses vapor occasionally  . Alcohol Use: No   OB History    No data available     Review of Systems  Gastrointestinal: Positive for abdominal pain.  All other systems reviewed and are negative.     Allergies  Contrast media and Prednisone  Home Medications   Prior to Admission medications   Medication Sig Start Date End Date Taking? Authorizing Provider  buPROPion (WELLBUTRIN XL) 300 MG 24 hr tablet Take 300 mg by mouth daily.    Historical Provider, MD  DUONEB 0.5-2.5 (3) MG/3ML SOLN Inhale 3 mLs into the lungs every 6 (six) hours as needed (for shortness of breath).  09/17/14   Historical Provider, MD  furosemide (LASIX) 20 MG tablet Take 0.5 tablets (10 mg total) by mouth daily. 12/20/14   [name], MD  gabapentin (NEURONTIN) 300 MG capsule TAKE 1 CAPSULE (300 MG TOTAL) BY MOUTH 4 (FOUR) TIMES DAILY. Patient taking differently: TAKE 1 CAPSULE (300 MG TOTAL) BY MOUTH 3 to 4  TIMES DAILY. 01/07/15   [name], MD  ibuprofen (ADVIL,MOTRIN) 400 MG tablet Take 1  tablet (400 mg total) by mouth every 8 (eight) hours as needed for moderate pain. 02/05/15   Laurie Panda, NP  KLOR-CON M20 20 MEQ tablet TAKE 1 TABLET (20 MEQ TOTAL) BY MOUTH ONCE. 02/13/15   [name], MD  levothyroxine (SYNTHROID, LEVOTHROID) 75 MCG tablet Take 125 mcg by mouth daily before breakfast.     Historical Provider, MD  lidocaine-prilocaine (EMLA) cream Apply 1 application topically as needed (for chemo injections).  08/21/14   Historical Provider, MD  LORazepam (ATIVAN) 0.5 MG tablet Take 1 tablet (0.5 mg total) by mouth at bedtime as needed (Nausea or vomiting). Patient not taking: Reported on 02/05/2015 07/10/14   Laurie Panda, NP  montelukast (SINGULAIR) 10 MG tablet Take 10 mg by mouth at bedtime.  11/09/13   Historical Provider, MD  oxyCODONE (OXY IR/ROXICODONE) 5 MG  immediate release tablet Take 1-2 tablets (5-10 mg total) by mouth every 4 (four) hours as needed for moderate pain. Patient not taking: Reported on 02/05/2015 12/18/14   [name], MD  PROAIR HFA 108 (90 BASE) MCG/ACT inhaler Inhale 2 puffs into the lungs every 4 (four) hours as needed for wheezing or shortness of breath.  01/19/13   Historical Provider, MD  rizatriptan (MAXALT-MLT) 5 MG disintegrating tablet TAKE 1 TABLET (5 MG TOTAL) BY MOUTH AS NEEDED FOR MIGRAINE. MAY REPEAT IN 2 HOURS IF NEEDED 12/12/14   Marcial Pacas, MD  senna-docusate (SENOKOT-S) 8.6-50 MG per tablet Take 1 tablet by mouth at bedtime as needed for mild constipation. Patient not taking: Reported on 02/05/2015 10/01/14   [name], MD  SUMAtriptan (IMITREX) 6 MG/0.5ML SOLN injection Inject 6 mg into the skin every 2 (two) hours as needed for migraine or headache. May repeat in 2 hours if headache persists or recurs. Max 2 doses in 24 hours    Historical Provider, MD  Trastuzumab (HERCEPTIN IV) Inject 462 mg into the vein every 21 ( twenty-one) days.    Historical Provider, MD  traZODone (DESYREL) 100 MG tablet TAKE 1 TABLET (100  MG TOTAL) BY MOUTH AT BEDTIME. 02/04/15   [name], MD  verapamil (CALAN-SR) 120 MG CR tablet TAKE 1 TABLET BY MOUTH AT BEDTIME ( MAX 30 DAY SUPPLY PER INSURANCE) 11/12/14   Marcial Pacas, MD   BP 109/65 mmHg  Pulse 79  Temp(Src) 97.8 F (36.6 C)  Resp 20  Ht 5\' 6"  (1.676 m)  Wt 171 lb (77.565 kg)  BMI 27.61 kg/m2  SpO2 100% Physical Exam  Nursing note and vitals reviewed.  54 year old female, resting comfortably and in no acute distress. Vital signs are normal. Oxygen saturation is 100%, which is normal. Head is normocephalic and atraumatic. PERRLA, EOMI. Oropharynx is clear. Neck is nontender and supple without adenopathy or JVD. Back is nontender and there is no CVA tenderness. Lungs are clear without rales, wheezes, or rhonchi. Chest is nontender. Heart has regular rate and rhythm without murmur. Abdomen is soft, flat, with moderate epigastric tenderness. There is no rebound or guarding. There are no masses or hepatosplenomegaly and peristalsis is normoactive. Extremities have no cyanosis or edema, full range of motion is present. Skin is warm and dry without rash. Neurologic: Mental status is normal, cranial nerves are intact, there are no motor or sensory deficits.  ED Course  Procedures (including critical care time) Labs Review Results for orders placed or performed during the hospital encounter of 02/18/15  Lipase, blood  Result Value Ref Range   Lipase 19 (L) 22 - 51 U/L  Comprehensive metabolic panel  Result Value Ref Range   Sodium 138 135 - 145 mmol/L   Potassium 3.7 3.5 - 5.1 mmol/L   Chloride 110 101 - 111 mmol/L   CO2 20 (L) 22 - 32 mmol/L   Glucose, Bld 114 (H) 65 - 99 mg/dL   BUN 15 6 - 20 mg/dL   Creatinine, Ser 1.02 (H) 0.44 - 1.00 mg/dL   Calcium 9.0 8.9 - 10.3 mg/dL   Total Protein 6.8 6.5 - 8.1 g/dL   Albumin 4.0 3.5 - 5.0 g/dL   AST 21 15 - 41 U/L   ALT 17 14 - 54 U/L   Alkaline Phosphatase 96 38 - 126 U/L   Total Bilirubin 0.3 0.3 - 1.2  mg/dL   GFR calc non Af Amer >60 >60 mL/min  GFR calc Af Amer >60 >60 mL/min   Anion gap 8 5 - 15  CBC  Result Value Ref Range   WBC 4.3 4.0 - 10.5 K/uL   RBC 3.44 (L) 3.87 - 5.11 MIL/uL   Hemoglobin 10.6 (L) 12.0 - 15.0 g/dL   HCT 31.0 (L) 36.0 - 46.0 %   MCV 90.1 78.0 - 100.0 fL   MCH 30.8 26.0 - 34.0 pg   MCHC 34.2 30.0 - 36.0 g/dL   RDW 13.2 11.5 - 15.5 %   Platelets 171 150 - 400 K/uL  Urinalysis, Routine w reflex microscopic (not at [facility])  Result Value Ref Range   Color, Urine YELLOW YELLOW   APPearance CLOUDY (A) CLEAR   Specific Gravity, Urine 1.018 1.005 - 1.030   pH 5.0 5.0 - 8.0   Glucose, UA NEGATIVE NEGATIVE mg/dL   Hgb urine dipstick SMALL (A) NEGATIVE   Bilirubin Urine NEGATIVE NEGATIVE   Ketones, ur NEGATIVE NEGATIVE mg/dL   Protein, ur NEGATIVE NEGATIVE mg/dL   Urobilinogen, UA 0.2 0.0 - 1.0 mg/dL   Nitrite NEGATIVE NEGATIVE   Leukocytes, UA MODERATE (A) NEGATIVE  Differential  Result Value Ref Range   Neutrophils Relative % 65 43 - 77 %   Neutro Abs 2.8 1.7 - 7.7 K/uL   Lymphocytes Relative 24 12 - 46 %   Lymphs Abs 1.0 0.7 - 4.0 K/uL   Monocytes Relative 9 3 - 12 %   Monocytes Absolute 0.4 0.1 - 1.0 K/uL   Eosinophils Relative 1 0 - 5 %   Eosinophils Absolute 0.1 0.0 - 0.7 K/uL   Basophils Relative 1 0 - 1 %   Basophils Absolute 0.0 0.0 - 0.1 K/uL  Urine microscopic-add on  Result Value Ref Range   Squamous Epithelial / LPF RARE RARE   WBC, UA 7-10 <3 WBC/hpf   RBC / HPF 3-6 <3 RBC/hpf   Bacteria, UA RARE RARE   Casts HYALINE CASTS (A) NEGATIVE   Urine-Other MUCOUS PRESENT   I-Stat CG4 Lactic Acid, ED  Result Value Ref Range   Lactic Acid, Venous 1.70 0.5 - 2.0 mmol/L  I-stat troponin, ED  Result Value Ref Range   Troponin i, poc 0.00 0.00 - 0.08 ng/mL   Comment 3           Imaging Review Ct Abdomen Pelvis Wo Contrast  02/18/2015   CLINICAL DATA:  Right upper quadrant pain.  Nausea.  EXAM: CT ABDOMEN AND PELVIS WITHOUT CONTRAST  TECHNIQUE:  Multidetector CT imaging of the abdomen and pelvis was performed following the standard protocol without IV contrast.  COMPARISON:  07/17/2014  FINDINGS: There is cholecystectomy. The liver and bile ducts appear unremarkable on unenhanced scanning. There are unremarkable unenhanced appearances of the spleen, pancreas, adrenals and kidneys. The abdominal aorta is normal in caliber. There is mild atherosclerotic calcification. There is no adenopathy in the abdomen or pelvis. There is prior surgery about the gastric fundus. The stomach, small bowel and colon are otherwise unremarkable. There is hysterectomy. No adnexal abnormality is evident.  There is no acute inflammatory change in the abdomen or pelvis. There is no ascites. There is no significant abnormality in the lower chest. There is no significant musculoskeletal lesion.  IMPRESSION: No acute findings in the abdomen or pelvis. No significant abnormality.   Electronically Signed   By: Andreas Newport M.D.   On: 02/18/2015 05:02     EKG Interpretation   Date/Time:  Monday February 18 2015 02:54:57 EDT Ventricular  Rate:  79 PR Interval:  156 QRS Duration: 101 QT Interval:  409 QTC Calculation: 469 R Axis:   90 Text Interpretation:  Sinus rhythm Consider right ventricular hypertrophy  Borderline T abnormalities, anterior leads When compared with ECG of  09/28/2014, HEART RATE has decreased Confirmed by [facility]  MD, Matasha Smigelski ([id number])  on 02/18/2015 2:58:01 AM      MDM   Final diagnoses:  Abdominal pain, unspecified abdominal location  Renal insufficiency  Normochromic normocytic anemia    Upper abdominal pain with exam seeming to localize in the epigastric area. She is status post surgery for acid reflux, but I'm suspicious that she has underlying GERD or peptic ulcer disease. She is given hydromorphone for pain, ondansetron for nausea, and will also be given pantoprazole. She'll be sent for CT of abdomen and pelvis. Review of old records confirms  treatment for her breast cancer including several CT scans of abdomen and pelvis for evaluation for metastatic disease.  CT is unremarkable. Laboratory workup is unremarkable. There is mild renal insufficiency which is unchanged, and chronic mild anemia which is unchanged. Borderline decrease in CO2 is noted but with normal anion gap. She did have transient drop in blood pressure which responded well to IV fluids. Lactic acid is come back normal. She had only modest relief of pain with above noted treatment. She was given a trial of a GI cocktail which did not affect her abdominal pain. She was given an additional dose of hydromorphone and feels better and feels that she can is ready to go home. She is discharged with prescriptions for ondansetron and oxycodone-acetaminophen. Upper and lower endoscopies are scheduled in 2 days and she is to keep that appointment. Return to ED if symptoms are worsening.  Delora Fuel, MD 70/96/28 [id number]

## 2015-02-19 ENCOUNTER — Other Ambulatory Visit: Payer: Self-pay | Admitting: Neurology

## 2015-02-20 ENCOUNTER — Other Ambulatory Visit: Payer: Self-pay | Admitting: Oncology

## 2015-02-26 ENCOUNTER — Ambulatory Visit (HOSPITAL_BASED_OUTPATIENT_CLINIC_OR_DEPARTMENT_OTHER): Payer: BLUE CROSS/BLUE SHIELD

## 2015-02-26 ENCOUNTER — Other Ambulatory Visit (HOSPITAL_BASED_OUTPATIENT_CLINIC_OR_DEPARTMENT_OTHER): Payer: BLUE CROSS/BLUE SHIELD

## 2015-02-26 VITALS — BP 124/74 | HR 77 | Temp 98.2°F | Resp 17

## 2015-02-26 DIAGNOSIS — C50419 Malignant neoplasm of upper-outer quadrant of unspecified female breast: Secondary | ICD-10-CM

## 2015-02-26 DIAGNOSIS — C50412 Malignant neoplasm of upper-outer quadrant of left female breast: Secondary | ICD-10-CM

## 2015-02-26 DIAGNOSIS — C50812 Malignant neoplasm of overlapping sites of left female breast: Secondary | ICD-10-CM

## 2015-02-26 DIAGNOSIS — Z5112 Encounter for antineoplastic immunotherapy: Secondary | ICD-10-CM | POA: Diagnosis not present

## 2015-02-26 LAB — CBC WITH DIFFERENTIAL/PLATELET
BASO%: 0.9 % (ref 0.0–2.0)
Basophils Absolute: 0 10*3/uL (ref 0.0–0.1)
EOS ABS: 0.1 10*3/uL (ref 0.0–0.5)
EOS%: 1.4 % (ref 0.0–7.0)
HEMATOCRIT: 31.9 % — AB (ref 34.8–46.6)
HGB: 10.8 g/dL — ABNORMAL LOW (ref 11.6–15.9)
LYMPH%: 28.6 % (ref 14.0–49.7)
MCH: 30.3 pg (ref 25.1–34.0)
MCHC: 33.8 g/dL (ref 31.5–36.0)
MCV: 89.7 fL (ref 79.5–101.0)
MONO#: 0.3 10*3/uL (ref 0.1–0.9)
MONO%: 8.2 % (ref 0.0–14.0)
NEUT%: 60.9 % (ref 38.4–76.8)
NEUTROS ABS: 2.4 10*3/uL (ref 1.5–6.5)
Platelets: 203 10*3/uL (ref 145–400)
RBC: 3.56 10*6/uL — ABNORMAL LOW (ref 3.70–5.45)
RDW: 14.2 % (ref 11.2–14.5)
WBC: 3.9 10*3/uL (ref 3.9–10.3)
lymph#: 1.1 10*3/uL (ref 0.9–3.3)

## 2015-02-26 LAB — COMPREHENSIVE METABOLIC PANEL (CC13)
ALT: 17 U/L (ref 0–55)
AST: 15 U/L (ref 5–34)
Albumin: 3.9 g/dL (ref 3.5–5.0)
Alkaline Phosphatase: 112 U/L (ref 40–150)
Anion Gap: 8 mEq/L (ref 3–11)
BUN: 10.3 mg/dL (ref 7.0–26.0)
CALCIUM: 9.3 mg/dL (ref 8.4–10.4)
CHLORIDE: 113 meq/L — AB (ref 98–109)
CO2: 25 mEq/L (ref 22–29)
Creatinine: 1.2 mg/dL — ABNORMAL HIGH (ref 0.6–1.1)
EGFR: 52 mL/min/{1.73_m2} — ABNORMAL LOW (ref >90)
Glucose: 100 mg/dl (ref 70–140)
POTASSIUM: 4.3 meq/L (ref 3.5–5.1)
Sodium: 145 mEq/L (ref 136–145)
TOTAL PROTEIN: 6.6 g/dL (ref 6.4–8.3)
Total Bilirubin: 0.34 mg/dL (ref 0.20–1.20)

## 2015-02-26 MED ORDER — ACETAMINOPHEN 325 MG PO TABS
650.0000 mg | ORAL_TABLET | Freq: Once | ORAL | Status: AC
  Administered 2015-02-26: 650 mg via ORAL

## 2015-02-26 MED ORDER — SODIUM CHLORIDE 0.9 % IV SOLN
Freq: Once | INTRAVENOUS | Status: AC
  Administered 2015-02-26: 16:00:00 via INTRAVENOUS

## 2015-02-26 MED ORDER — ACETAMINOPHEN 325 MG PO TABS
ORAL_TABLET | ORAL | Status: AC
  Filled 2015-02-26: qty 2

## 2015-02-26 MED ORDER — DIPHENHYDRAMINE HCL 25 MG PO CAPS
ORAL_CAPSULE | ORAL | Status: AC
  Filled 2015-02-26: qty 1

## 2015-02-26 MED ORDER — SODIUM CHLORIDE 0.9 % IV SOLN
6.0000 mg/kg | Freq: Once | INTRAVENOUS | Status: AC
  Administered 2015-02-26: 462 mg via INTRAVENOUS
  Filled 2015-02-26: qty 22

## 2015-02-26 MED ORDER — DIPHENHYDRAMINE HCL 25 MG PO CAPS
25.0000 mg | ORAL_CAPSULE | Freq: Once | ORAL | Status: AC
  Administered 2015-02-26: 25 mg via ORAL

## 2015-02-26 MED ORDER — SODIUM CHLORIDE 0.9 % IJ SOLN
10.0000 mL | INTRAMUSCULAR | Status: DC | PRN
  Administered 2015-02-26: 10 mL
  Filled 2015-02-26: qty 10

## 2015-02-26 MED ORDER — HEPARIN SOD (PORK) LOCK FLUSH 100 UNIT/ML IV SOLN
500.0000 [IU] | Freq: Once | INTRAVENOUS | Status: AC | PRN
  Administered 2015-02-26: 500 [IU]
  Filled 2015-02-26: qty 5

## 2015-02-26 NOTE — Patient Instructions (Signed)

## 2015-03-04 ENCOUNTER — Other Ambulatory Visit: Payer: Self-pay | Admitting: Nurse Practitioner

## 2015-03-05 ENCOUNTER — Other Ambulatory Visit: Payer: Self-pay | Admitting: *Deleted

## 2015-03-19 ENCOUNTER — Other Ambulatory Visit (HOSPITAL_BASED_OUTPATIENT_CLINIC_OR_DEPARTMENT_OTHER): Payer: BLUE CROSS/BLUE SHIELD

## 2015-03-19 ENCOUNTER — Ambulatory Visit (HOSPITAL_BASED_OUTPATIENT_CLINIC_OR_DEPARTMENT_OTHER): Payer: BLUE CROSS/BLUE SHIELD

## 2015-03-19 VITALS — BP 106/62 | HR 79 | Temp 98.1°F | Resp 16

## 2015-03-19 DIAGNOSIS — C50412 Malignant neoplasm of upper-outer quadrant of left female breast: Secondary | ICD-10-CM

## 2015-03-19 DIAGNOSIS — Z5112 Encounter for antineoplastic immunotherapy: Secondary | ICD-10-CM

## 2015-03-19 DIAGNOSIS — C50812 Malignant neoplasm of overlapping sites of left female breast: Secondary | ICD-10-CM | POA: Diagnosis not present

## 2015-03-19 DIAGNOSIS — C50419 Malignant neoplasm of upper-outer quadrant of unspecified female breast: Secondary | ICD-10-CM

## 2015-03-19 LAB — CBC WITH DIFFERENTIAL/PLATELET
BASO%: 0.6 % (ref 0.0–2.0)
BASOS ABS: 0 10*3/uL (ref 0.0–0.1)
EOS%: 1.7 % (ref 0.0–7.0)
Eosinophils Absolute: 0.1 10*3/uL (ref 0.0–0.5)
HCT: 30.8 % — ABNORMAL LOW (ref 34.8–46.6)
HEMOGLOBIN: 10.6 g/dL — AB (ref 11.6–15.9)
LYMPH%: 34.5 % (ref 14.0–49.7)
MCH: 30.7 pg (ref 25.1–34.0)
MCHC: 34.4 g/dL (ref 31.5–36.0)
MCV: 89.3 fL (ref 79.5–101.0)
MONO#: 0.4 10*3/uL (ref 0.1–0.9)
MONO%: 10.3 % (ref 0.0–14.0)
NEUT%: 52.9 % (ref 38.4–76.8)
NEUTROS ABS: 1.8 10*3/uL (ref 1.5–6.5)
Platelets: 161 10*3/uL (ref 145–400)
RBC: 3.45 10*6/uL — ABNORMAL LOW (ref 3.70–5.45)
RDW: 13.4 % (ref 11.2–14.5)
WBC: 3.5 10*3/uL — ABNORMAL LOW (ref 3.9–10.3)
lymph#: 1.2 10*3/uL (ref 0.9–3.3)

## 2015-03-19 LAB — COMPREHENSIVE METABOLIC PANEL (CC13)
ALBUMIN: 4.2 g/dL (ref 3.5–5.0)
ALK PHOS: 121 U/L (ref 40–150)
ALT: 15 U/L (ref 0–55)
AST: 16 U/L (ref 5–34)
Anion Gap: 8 mEq/L (ref 3–11)
BILIRUBIN TOTAL: 0.49 mg/dL (ref 0.20–1.20)
BUN: 18 mg/dL (ref 7.0–26.0)
CALCIUM: 9.2 mg/dL (ref 8.4–10.4)
CO2: 24 mEq/L (ref 22–29)
CREATININE: 1.6 mg/dL — AB (ref 0.6–1.1)
Chloride: 110 mEq/L — ABNORMAL HIGH (ref 98–109)
EGFR: 38 mL/min/{1.73_m2} — ABNORMAL LOW (ref >90)
GLUCOSE: 80 mg/dL (ref 70–140)
Potassium: 4.1 mEq/L (ref 3.5–5.1)
SODIUM: 142 meq/L (ref 136–145)
TOTAL PROTEIN: 6.7 g/dL (ref 6.4–8.3)

## 2015-03-19 MED ORDER — ACETAMINOPHEN 325 MG PO TABS
650.0000 mg | ORAL_TABLET | Freq: Once | ORAL | Status: AC
Start: 2015-03-19 — End: 2015-03-19
  Administered 2015-03-19: 650 mg via ORAL

## 2015-03-19 MED ORDER — SODIUM CHLORIDE 0.9 % IJ SOLN
10.0000 mL | INTRAMUSCULAR | Status: DC | PRN
  Administered 2015-03-19: 10 mL
  Filled 2015-03-19: qty 10

## 2015-03-19 MED ORDER — TRASTUZUMAB CHEMO INJECTION 440 MG
6.0000 mg/kg | Freq: Once | INTRAVENOUS | Status: AC
  Administered 2015-03-19: 462 mg via INTRAVENOUS
  Filled 2015-03-19: qty 22

## 2015-03-19 MED ORDER — DIPHENHYDRAMINE HCL 25 MG PO CAPS
ORAL_CAPSULE | ORAL | Status: AC
  Filled 2015-03-19: qty 1

## 2015-03-19 MED ORDER — HEPARIN SOD (PORK) LOCK FLUSH 100 UNIT/ML IV SOLN
500.0000 [IU] | Freq: Once | INTRAVENOUS | Status: AC | PRN
  Administered 2015-03-19: 500 [IU]
  Filled 2015-03-19: qty 5

## 2015-03-19 MED ORDER — SODIUM CHLORIDE 0.9 % IV SOLN
Freq: Once | INTRAVENOUS | Status: AC
  Administered 2015-03-19: 16:00:00 via INTRAVENOUS

## 2015-03-19 MED ORDER — ACETAMINOPHEN 325 MG PO TABS
ORAL_TABLET | ORAL | Status: AC
  Filled 2015-03-19: qty 2

## 2015-03-19 MED ORDER — DIPHENHYDRAMINE HCL 25 MG PO CAPS
25.0000 mg | ORAL_CAPSULE | Freq: Once | ORAL | Status: AC
  Administered 2015-03-19: 25 mg via ORAL

## 2015-03-19 NOTE — Patient Instructions (Addendum)
[facility] Discharge Instructions for Patients Receiving Chemotherapy  Today you received the following chemotherapy agents:  Herceptin  To help prevent nausea and vomiting after your treatment, we encourage you to take your nausea medication as prescribed.   If you develop nausea and vomiting that is not controlled by your nausea medication, call the clinic.   BELOW ARE SYMPTOMS THAT SHOULD BE REPORTED IMMEDIATELY:  *FEVER GREATER THAN 100.5 F  *CHILLS WITH OR WITHOUT FEVER  NAUSEA AND VOMITING THAT IS NOT CONTROLLED WITH YOUR NAUSEA MEDICATION  *UNUSUAL SHORTNESS OF BREATH  *UNUSUAL BRUISING OR BLEEDING  TENDERNESS IN MOUTH AND THROAT WITH OR WITHOUT PRESENCE OF ULCERS  *URINARY PROBLEMS  *BOWEL PROBLEMS  UNUSUAL RASH Items with * indicate a potential emergency and should be followed up as soon as possible.  Feel free to call the clinic you have any questions or concerns. The clinic phone number is [phone].  Please show the CHEMO ALERT CARD at check-in to the Emergency Department and triage nurse.   

## 2015-03-22 ENCOUNTER — Telehealth: Payer: Self-pay

## 2015-03-22 NOTE — Telephone Encounter (Signed)
Writer called and spoke with patient's husband to let him know that Dr. Jana Hakim did not prescribe tizanidine and will not refill it.  Patient is recommended to go back to her neurologist Dr. Jannifer Franklin who originally prescribed this medication.

## 2015-03-26 ENCOUNTER — Other Ambulatory Visit: Payer: Self-pay | Admitting: *Deleted

## 2015-03-26 MED ORDER — TIZANIDINE HCL 2 MG PO TABS: 2.0000 mg | ORAL_TABLET | Freq: Two times a day (BID) | ORAL | Status: DC | PRN

## 2015-03-26 MED ORDER — TIZANIDINE HCL 2 MG PO TABS: 2.0000 mg | ORAL_TABLET | Freq: Two times a day (BID) | ORAL | Status: AC | PRN

## 2015-04-01 ENCOUNTER — Other Ambulatory Visit: Payer: Self-pay | Admitting: Oncology

## 2015-04-09 ENCOUNTER — Ambulatory Visit: Payer: BLUE CROSS/BLUE SHIELD | Admitting: Cardiovascular Disease

## 2015-04-09 ENCOUNTER — Other Ambulatory Visit: Payer: Self-pay | Admitting: *Deleted

## 2015-04-09 ENCOUNTER — Telehealth: Payer: Self-pay | Admitting: Oncology

## 2015-04-09 ENCOUNTER — Other Ambulatory Visit (HOSPITAL_BASED_OUTPATIENT_CLINIC_OR_DEPARTMENT_OTHER): Payer: BLUE CROSS/BLUE SHIELD

## 2015-04-09 DIAGNOSIS — C50419 Malignant neoplasm of upper-outer quadrant of unspecified female breast: Secondary | ICD-10-CM

## 2015-04-09 DIAGNOSIS — C50412 Malignant neoplasm of upper-outer quadrant of left female breast: Secondary | ICD-10-CM

## 2015-04-09 LAB — COMPREHENSIVE METABOLIC PANEL (CC13)
ALT: 13 U/L (ref 0–55)
ANION GAP: 6 meq/L (ref 3–11)
AST: 14 U/L (ref 5–34)
Albumin: 4.2 g/dL (ref 3.5–5.0)
Alkaline Phosphatase: 118 U/L (ref 40–150)
BUN: 16 mg/dL (ref 7.0–26.0)
CALCIUM: 9.2 mg/dL (ref 8.4–10.4)
CHLORIDE: 111 meq/L — AB (ref 98–109)
CO2: 24 meq/L (ref 22–29)
Creatinine: 1.3 mg/dL — ABNORMAL HIGH (ref 0.6–1.1)
EGFR: 49 mL/min/{1.73_m2} — AB (ref >90)
Glucose: 103 mg/dl (ref 70–140)
Potassium: 4.1 mEq/L (ref 3.5–5.1)
Sodium: 141 mEq/L (ref 136–145)
Total Bilirubin: 0.53 mg/dL (ref 0.20–1.20)
Total Protein: 6.6 g/dL (ref 6.4–8.3)

## 2015-04-09 LAB — CBC WITH DIFFERENTIAL/PLATELET
BASO%: 0.8 % (ref 0.0–2.0)
BASOS ABS: 0 10*3/uL (ref 0.0–0.1)
EOS ABS: 0 10*3/uL (ref 0.0–0.5)
EOS%: 0.9 % (ref 0.0–7.0)
HEMATOCRIT: 30.8 % — AB (ref 34.8–46.6)
HGB: 10.7 g/dL — ABNORMAL LOW (ref 11.6–15.9)
LYMPH%: 26.7 % (ref 14.0–49.7)
MCH: 30.8 pg (ref 25.1–34.0)
MCHC: 34.7 g/dL (ref 31.5–36.0)
MCV: 88.7 fL (ref 79.5–101.0)
MONO#: 0.3 10*3/uL (ref 0.1–0.9)
MONO%: 6.9 % (ref 0.0–14.0)
NEUT#: 2.6 10*3/uL (ref 1.5–6.5)
NEUT%: 64.7 % (ref 38.4–76.8)
PLATELETS: 180 10*3/uL (ref 145–400)
RBC: 3.48 10*6/uL — AB (ref 3.70–5.45)
RDW: 14.1 % (ref 11.2–14.5)
WBC: 4.1 10*3/uL (ref 3.9–10.3)
lymph#: 1.1 10*3/uL (ref 0.9–3.3)

## 2015-04-09 NOTE — Telephone Encounter (Signed)
Pt came in to schedule chemo was not scheduled, called Sharyn Lull pt was scheduled for chemo 09/21.... Pt confirmed.Stacy KitchenMarland KitchenMarland Fernandez

## 2015-04-10 ENCOUNTER — Ambulatory Visit (HOSPITAL_BASED_OUTPATIENT_CLINIC_OR_DEPARTMENT_OTHER): Payer: BLUE CROSS/BLUE SHIELD

## 2015-04-10 DIAGNOSIS — Z5112 Encounter for antineoplastic immunotherapy: Secondary | ICD-10-CM

## 2015-04-10 DIAGNOSIS — C50412 Malignant neoplasm of upper-outer quadrant of left female breast: Secondary | ICD-10-CM

## 2015-04-10 DIAGNOSIS — C50812 Malignant neoplasm of overlapping sites of left female breast: Secondary | ICD-10-CM | POA: Diagnosis not present

## 2015-04-10 MED ORDER — ACETAMINOPHEN 325 MG PO TABS
ORAL_TABLET | ORAL | Status: AC
  Filled 2015-04-10: qty 2

## 2015-04-10 MED ORDER — SODIUM CHLORIDE 0.9 % IV SOLN
Freq: Once | INTRAVENOUS | Status: AC
  Administered 2015-04-10: 15:00:00 via INTRAVENOUS

## 2015-04-10 MED ORDER — HEPARIN SOD (PORK) LOCK FLUSH 100 UNIT/ML IV SOLN
500.0000 [IU] | Freq: Once | INTRAVENOUS | Status: AC | PRN
  Administered 2015-04-10: 500 [IU]
  Filled 2015-04-10: qty 5

## 2015-04-10 MED ORDER — SODIUM CHLORIDE 0.9 % IJ SOLN
10.0000 mL | INTRAMUSCULAR | Status: DC | PRN
  Administered 2015-04-10: 10 mL
  Filled 2015-04-10: qty 10

## 2015-04-10 MED ORDER — ACETAMINOPHEN 325 MG PO TABS
650.0000 mg | ORAL_TABLET | Freq: Once | ORAL | Status: AC
  Administered 2015-04-10: 650 mg via ORAL

## 2015-04-10 MED ORDER — DIPHENHYDRAMINE HCL 25 MG PO CAPS
ORAL_CAPSULE | ORAL | Status: AC
  Filled 2015-04-10: qty 1

## 2015-04-10 MED ORDER — TRASTUZUMAB CHEMO INJECTION 440 MG
6.0000 mg/kg | Freq: Once | INTRAVENOUS | Status: AC
  Administered 2015-04-10: 462 mg via INTRAVENOUS
  Filled 2015-04-10: qty 22

## 2015-04-10 MED ORDER — DIPHENHYDRAMINE HCL 25 MG PO CAPS
25.0000 mg | ORAL_CAPSULE | Freq: Once | ORAL | Status: AC
  Administered 2015-04-10: 25 mg via ORAL

## 2015-04-10 NOTE — Patient Instructions (Signed)
[facility] Discharge Instructions for Patients Receiving Chemotherapy  Today you received the following chemotherapy agents Herceptin  To help prevent nausea and vomiting after your treatment, we encourage you to take your nausea medication    If you develop nausea and vomiting that is not controlled by your nausea medication, call the clinic.   BELOW ARE SYMPTOMS THAT SHOULD BE REPORTED IMMEDIATELY:  *FEVER GREATER THAN 100.5 F  *CHILLS WITH OR WITHOUT FEVER  NAUSEA AND VOMITING THAT IS NOT CONTROLLED WITH YOUR NAUSEA MEDICATION  *UNUSUAL SHORTNESS OF BREATH  *UNUSUAL BRUISING OR BLEEDING  TENDERNESS IN MOUTH AND THROAT WITH OR WITHOUT PRESENCE OF ULCERS  *URINARY PROBLEMS  *BOWEL PROBLEMS  UNUSUAL RASH Items with * indicate a potential emergency and should be followed up as soon as possible.  Feel free to call the clinic you have any questions or concerns. The clinic phone number is [phone].  Please show the CHEMO ALERT CARD at check-in to the Emergency Department and triage nurse.   

## 2015-04-16 ENCOUNTER — Ambulatory Visit (HOSPITAL_BASED_OUTPATIENT_CLINIC_OR_DEPARTMENT_OTHER): Payer: BLUE CROSS/BLUE SHIELD

## 2015-04-16 ENCOUNTER — Encounter: Payer: Self-pay | Admitting: *Deleted

## 2015-04-16 ENCOUNTER — Telehealth: Payer: Self-pay | Admitting: *Deleted

## 2015-04-16 ENCOUNTER — Ambulatory Visit (HOSPITAL_BASED_OUTPATIENT_CLINIC_OR_DEPARTMENT_OTHER): Payer: BLUE CROSS/BLUE SHIELD | Admitting: Nurse Practitioner

## 2015-04-16 VITALS — BP 111/62 | HR 80 | Temp 96.9°F | Resp 20

## 2015-04-16 DIAGNOSIS — C50812 Malignant neoplasm of overlapping sites of left female breast: Secondary | ICD-10-CM | POA: Diagnosis not present

## 2015-04-16 DIAGNOSIS — N289 Disorder of kidney and ureter, unspecified: Secondary | ICD-10-CM

## 2015-04-16 DIAGNOSIS — M545 Low back pain, unspecified: Secondary | ICD-10-CM

## 2015-04-16 DIAGNOSIS — C50412 Malignant neoplasm of upper-outer quadrant of left female breast: Secondary | ICD-10-CM

## 2015-04-16 DIAGNOSIS — R197 Diarrhea, unspecified: Secondary | ICD-10-CM | POA: Diagnosis not present

## 2015-04-16 LAB — COMPREHENSIVE METABOLIC PANEL (CC13)
ALT: 15 U/L (ref 0–55)
ANION GAP: 10 meq/L (ref 3–11)
AST: 16 U/L (ref 5–34)
Albumin: 4.4 g/dL (ref 3.5–5.0)
Alkaline Phosphatase: 121 U/L (ref 40–150)
BUN: 19.7 mg/dL (ref 7.0–26.0)
CALCIUM: 9.2 mg/dL (ref 8.4–10.4)
CHLORIDE: 110 meq/L — AB (ref 98–109)
CO2: 22 mEq/L (ref 22–29)
Creatinine: 1.5 mg/dL — ABNORMAL HIGH (ref 0.6–1.1)
EGFR: 39 mL/min/{1.73_m2} — ABNORMAL LOW (ref >90)
Glucose: 89 mg/dl (ref 70–140)
POTASSIUM: 3.8 meq/L (ref 3.5–5.1)
Sodium: 142 mEq/L (ref 136–145)
Total Bilirubin: 0.53 mg/dL (ref 0.20–1.20)
Total Protein: 6.9 g/dL (ref 6.4–8.3)

## 2015-04-16 LAB — CBC WITH DIFFERENTIAL/PLATELET
BASO%: 0.7 % (ref 0.0–2.0)
BASOS ABS: 0 10*3/uL (ref 0.0–0.1)
EOS%: 0.8 % (ref 0.0–7.0)
Eosinophils Absolute: 0 10*3/uL (ref 0.0–0.5)
HEMATOCRIT: 32.4 % — AB (ref 34.8–46.6)
HGB: 11 g/dL — ABNORMAL LOW (ref 11.6–15.9)
LYMPH#: 1.1 10*3/uL (ref 0.9–3.3)
LYMPH%: 26 % (ref 14.0–49.7)
MCH: 30.2 pg (ref 25.1–34.0)
MCHC: 34.1 g/dL (ref 31.5–36.0)
MCV: 88.6 fL (ref 79.5–101.0)
MONO#: 0.4 10*3/uL (ref 0.1–0.9)
MONO%: 8.3 % (ref 0.0–14.0)
NEUT#: 2.8 10*3/uL (ref 1.5–6.5)
NEUT%: 64.2 % (ref 38.4–76.8)
Platelets: 191 10*3/uL (ref 145–400)
RBC: 3.66 10*6/uL — ABNORMAL LOW (ref 3.70–5.45)
RDW: 13.9 % (ref 11.2–14.5)
WBC: 4.3 10*3/uL (ref 3.9–10.3)

## 2015-04-16 MED ORDER — OXYCODONE-ACETAMINOPHEN 5-325 MG PO TABS: 1.0000 | ORAL_TABLET | ORAL | Status: DC | PRN

## 2015-04-16 NOTE — Telephone Encounter (Signed)
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 Has had some back pain X 2 days. Currently pelvis muscle is hurting. Is coming now for labs and to see Selena Lesser, NP

## 2015-04-17 ENCOUNTER — Encounter: Payer: BLUE CROSS/BLUE SHIELD | Admitting: Nurse Practitioner

## 2015-04-17 ENCOUNTER — Other Ambulatory Visit: Payer: Self-pay | Admitting: Oncology

## 2015-04-17 ENCOUNTER — Ambulatory Visit: Payer: BLUE CROSS/BLUE SHIELD

## 2015-04-18 ENCOUNTER — Ambulatory Visit (HOSPITAL_BASED_OUTPATIENT_CLINIC_OR_DEPARTMENT_OTHER): Payer: BLUE CROSS/BLUE SHIELD | Admitting: Nurse Practitioner

## 2015-04-18 ENCOUNTER — Other Ambulatory Visit: Payer: Self-pay | Admitting: *Deleted

## 2015-04-18 ENCOUNTER — Telehealth: Payer: Self-pay | Admitting: *Deleted

## 2015-04-18 ENCOUNTER — Ambulatory Visit (HOSPITAL_COMMUNITY)
Admission: RE | Admit: 2015-04-18 | Discharge: 2015-04-18 | Disposition: A | Discharge status: Home / Self Care | Payer: BLUE CROSS/BLUE SHIELD | Source: Ambulatory Visit | Attending: Nurse Practitioner | Admitting: Nurse Practitioner

## 2015-04-18 ENCOUNTER — Other Ambulatory Visit (HOSPITAL_COMMUNITY)
Admission: AD | Admit: 2015-04-18 | Discharge: 2015-04-18 | Disposition: A | Discharge status: Home / Self Care | Payer: BLUE CROSS/BLUE SHIELD | Source: Ambulatory Visit | Attending: Oncology | Admitting: Oncology

## 2015-04-18 VITALS — BP 101/67 | HR 74 | Temp 97.7°F | Resp 17 | Ht 66.0 in | Wt 169.3 lb

## 2015-04-18 DIAGNOSIS — M549 Dorsalgia, unspecified: Secondary | ICD-10-CM

## 2015-04-18 DIAGNOSIS — C50412 Malignant neoplasm of upper-outer quadrant of left female breast: Secondary | ICD-10-CM

## 2015-04-18 DIAGNOSIS — R102 Pelvic and perineal pain: Secondary | ICD-10-CM | POA: Insufficient documentation

## 2015-04-18 DIAGNOSIS — M545 Low back pain: Secondary | ICD-10-CM | POA: Insufficient documentation

## 2015-04-18 DIAGNOSIS — R197 Diarrhea, unspecified: Secondary | ICD-10-CM | POA: Diagnosis not present

## 2015-04-18 NOTE — Telephone Encounter (Signed)
Received call from patient stating,"I saw Selena Lesser two days ago, but I am having numbness and tingling on my right lateral side, that radiates to my upper thigh, sometimes goes down the whole leg. Cyndee told me I might need to do a scan." Informed patient that I would schedule her for Symptom Management at 3:00 pm. No labs needed. Patient stated,"I'm at work, so, if Cyndee needs to talk to me she needs to call my cell phone, [phone]." Told patient I would give this message to Cyndee. Patient verbalized understanding.

## 2015-04-19 ENCOUNTER — Ambulatory Visit: Payer: BLUE CROSS/BLUE SHIELD

## 2015-04-19 ENCOUNTER — Telehealth: Payer: Self-pay | Admitting: *Deleted

## 2015-04-19 ENCOUNTER — Encounter: Payer: Self-pay | Admitting: Nurse Practitioner

## 2015-04-19 ENCOUNTER — Other Ambulatory Visit: Payer: Self-pay | Admitting: *Deleted

## 2015-04-19 ENCOUNTER — Other Ambulatory Visit (HOSPITAL_COMMUNITY)
Admission: RE | Admit: 2015-04-19 | Discharge: 2015-04-19 | Disposition: A | Discharge status: Home / Self Care | Payer: BLUE CROSS/BLUE SHIELD | Source: Ambulatory Visit | Attending: Oncology | Admitting: Oncology

## 2015-04-19 ENCOUNTER — Telehealth: Payer: Self-pay | Admitting: Nurse Practitioner

## 2015-04-19 DIAGNOSIS — C50412 Malignant neoplasm of upper-outer quadrant of left female breast: Secondary | ICD-10-CM | POA: Diagnosis not present

## 2015-04-19 DIAGNOSIS — M549 Dorsalgia, unspecified: Secondary | ICD-10-CM | POA: Insufficient documentation

## 2015-04-19 DIAGNOSIS — R197 Diarrhea, unspecified: Secondary | ICD-10-CM

## 2015-04-19 DIAGNOSIS — N289 Disorder of kidney and ureter, unspecified: Secondary | ICD-10-CM | POA: Insufficient documentation

## 2015-04-19 LAB — C DIFFICILE QUICK SCREEN W PCR REFLEX
C DIFFICLE (CDIFF) ANTIGEN: NEGATIVE
C Diff interpretation: NEGATIVE
C Diff toxin: NEGATIVE

## 2015-04-19 NOTE — Telephone Encounter (Signed)
Spoke with patient.  Let her know per Selena Lesser NP, that xrays were fine.  We still do not have results of C. Diff.  Patient is asking if Cyndee will go ahead and schedule scans?  Will check with Cyndee and see what the plan is.  Patient call back is [phone].

## 2015-04-19 NOTE — Progress Notes (Signed)
SYMPTOM MANAGEMENT CLINIC   HPI: Stacy Fernandez 54 y.o. female diagnosed with breast cancer.  Currently undergoing Herceptin therapy.  Patient states that she developed acute onset diarrhea with stool incontinence and low back pain earlier this afternoon.  She has had no further diarrhea; and denies any other new symptoms.  She denies any recent fevers or chills.  Patient states that she continues to take Cipro antibiotics from a previously diagnosed urinary tract infection.   HPI  ROS  Past Medical History  Diagnosis Date  . Hypothyroidism   . Anxiety     Due to situation, CA breast  . Wears glasses   . Wears dentures     upper  . Migraines   . COPD (chronic obstructive pulmonary disease)     denies SOB with ADLs  . Hematuria     cause unknow- saw Urologist  . History of breast cancer 03/2014    left  . History of pneumonia 09/2014  . History of chemotherapy     finished chemo 09/2014  . Neuropathic pain of both feet     due to effects of chemo  . History of gastroesophageal reflux (GERD)     no problems since Nissen fundoplication  . Supraclinoid carotid artery aneurysm, small     left - 2 mm; states is being monitored  . Cancer 02/2014    left breast    Past Surgical History  Procedure Laterality Date  . Cholecystectomy    . Appendectomy    . Laparoscopic nissen fundoplication  03/8118    for GERD- not a problem  . Video bronchoscopy Bilateral 01/16/2013    Procedure: VIDEO BRONCHOSCOPY WITHOUT FLUORO;  Surgeon: [name], MD;  Location: [facility];  Service: Cardiopulmonary;  Laterality: Bilateral;  . Mastectomy complete / simple w/ sentinel node biopsy Left 04/02/2014    axillary SLN  . Mastectomy complete / simple Right 04/02/2014    PROPHYLACTIC  . Portacath placement Right 04/02/2014  . Total abdominal hysterectomy      partial   . Simple mastectomy with axillary sentinel node biopsy Bilateral 04/02/2014    Procedure: LEFT TOTAL MASTECTOMY WITH  LEFT AXILLARY SENTINEL NODE BIOPSY, RIGHT PROPHYLACTIC MASTETCTOMY;  Surgeon: [name], MD;  Location: [place];  Service: General;  Laterality: Bilateral;  . Portacath placement N/A 04/02/2014    Procedure: INSERTION PORT-A-CATH;  Surgeon: [name], MD;  Location: [place];  Service: General;  Laterality: N/A;  . Left heart catheterization with coronary angiogram N/A 12/26/2012    Procedure: LEFT HEART CATHETERIZATION WITH CORONARY ANGIOGRAM;  Surgeon: Lorretta Harp, MD;  Location: [facility] C[facility];  Service: Cardiovascular;  Laterality: N/A;  . Breast reconstruction with placement of tissue expander and flex hd (acellular hydrated dermis) Bilateral 09/25/2014    Procedure: PLACEMENT OF BILATERAL TISSUE EXPANDER AND  ACELLULAR DERMIS FOR BREAST RECONSTRUCTION ;  Surgeon: [name], MD;  Location: [place];  Service: Plastics;  Laterality: Bilateral;  . Bilateral salpingoophorectomy    . Cardiac catheterization  12/26/2012    "essentially normal coronary arteries, normal LV function"  . Nissen fundoplication  1/47/8295  . Esophagogastroduodenoscopy (egd) with propofol  01/18/2013  . Removal of bilateral tissue expanders with placement of bilateral breast implants Bilateral 12/18/2014    Procedure: REMOVAL OF BILATERAL TISSUE EXPANDERS,PLACEMENT SILICONE IMPLANTS ;  Surgeon: [name], MD;  Location: [place];  Service: Plastics;  Laterality: Bilateral;  . Liposuction with lipofilling Bilateral 12/18/2014  Procedure: LIPOFILLING TO BILATERAL CHEST;  Surgeon: [name], MD;  Location: [place];  Service: Plastics;  Laterality: Bilateral;    has Chest pain; Hypothyroidism; Tobacco abuse; COPD (chronic obstructive pulmonary disease); S/P Nissen fundoplication (without gastrostomy tube) procedure; COPD exacerbation; Intractable migraine without aura; CAD (coronary artery disease); Breast cancer of upper-outer quadrant of left female  breast; Family history of malignant neoplasm of gastrointestinal tract; Family history of malignant neoplasm of breast; Family history of malignant neoplasm of ovary; Pain at surgical site; Diarrhea; Mucositis due to chemotherapy; Irregular bowel habits; Anemia in neoplastic disease; Atherosclerosis; Insomnia; Neuropathy due to chemotherapeutic drug; Chemotherapy induced neutropenia; Acquired absence of breast and nipple; Hospital-acquired pneumonia; Ankle edema; Lung nodule; Back pain; and Renal insufficiency on her problem list.    is allergic to contrast media and prednisone.    Medication List       This list is accurate as of: 04/16/15 11:59 PM.  Always use your most recent med list.               buPROPion 300 MG 24 hr tablet  Commonly known as:  WELLBUTRIN XL  Take 300 mg by mouth daily.     DUONEB 0.5-2.5 (3) MG/3ML Soln  Generic drug:  ipratropium-albuterol  Inhale 3 mLs into the lungs every 6 (six) hours as needed (for shortness of breath).     fluticasone 50 MCG/ACT nasal spray  Commonly known as:  FLONASE  Place 1 spray into both nostrils daily as needed for allergies or rhinitis.     furosemide 20 MG tablet  Commonly known as:  LASIX  Take 0.5 tablets (10 mg total) by mouth daily.     gabapentin 300 MG capsule  Commonly known as:  NEURONTIN  TAKE 1 CAPSULE (300 MG TOTAL) BY MOUTH 4 (FOUR) TIMES DAILY.     Garcinia Cambogia-Chromium 500-200 MG-MCG Tabs  Take 1 tablet by mouth 2 (two) times daily.     HERCEPTIN IV  Inject 462 mg into the vein every 21 ( twenty-one) days.     ibuprofen 400 MG tablet  Commonly known as:  ADVIL,MOTRIN  Take 1 tablet (400 mg total) by mouth every 8 (eight) hours as needed for moderate pain.     KLOR-CON M20 20 MEQ tablet  Generic drug:  potassium chloride SA  TAKE 1 TABLET (20 MEQ TOTAL) BY MOUTH ONCE.     levothyroxine 125 MCG tablet  Commonly known as:  SYNTHROID, LEVOTHROID  Take 125 mcg by mouth daily before breakfast.       lidocaine-prilocaine cream  Commonly known as:  EMLA  Apply 1 application topically as needed (for chemo injections).     LORazepam 0.5 MG tablet  Commonly known as:  ATIVAN  Take 1 tablet (0.5 mg total) by mouth at bedtime as needed (Nausea or vomiting).     montelukast 10 MG tablet  Commonly known as:  SINGULAIR  Take 10 mg by mouth at bedtime.     ondansetron 4 MG tablet  Commonly known as:  ZOFRAN  Take 1 tablet (4 mg total) by mouth every 6 (six) hours as needed for nausea or vomiting.     oxyCODONE 5 MG immediate release tablet  Commonly known as:  Oxy IR/ROXICODONE  Take 1-2 tablets (5-10 mg total) by mouth every 4 (four) hours as needed for moderate pain.     oxyCODONE-acetaminophen 5-325 MG tablet  Commonly known as:  PERCOCET  Take 1 tablet by mouth every 4 (  four) hours as needed for moderate pain.     PROAIR HFA 108 (90 BASE) MCG/ACT inhaler  Generic drug:  albuterol  Inhale 2 puffs into the lungs every 4 (four) hours as needed for wheezing or shortness of breath.     rizatriptan 5 MG disintegrating tablet  Commonly known as:  MAXALT-MLT  TAKE 1 TABLET (5 MG TOTAL) BY MOUTH AS NEEDED FOR MIGRAINE. MAY REPEAT IN 2 HOURS IF NEEDED     senna-docusate 8.6-50 MG tablet  Commonly known as:  Senokot-S  Take 1 tablet by mouth at bedtime as needed for mild constipation.     SUMAtriptan 6 MG/0.5ML Soln injection  Commonly known as:  IMITREX  Inject 6 mg into the skin every 2 (two) hours as needed for migraine or headache. May repeat in 2 hours if headache persists or recurs. Max 2 doses in 24 hours     traZODone 100 MG tablet  Commonly known as:  DESYREL  TAKE 1 TABLET (100 MG TOTAL) BY MOUTH AT BEDTIME.     verapamil 120 MG CR tablet  Commonly known as:  CALAN-SR  TAKE 1 TABLET BY MOUTH AT BEDTIME ( MAX 30 DAY SUPPLY PER INSURANCE)         PHYSICAL EXAMINATION  Oncology Vitals 04/18/2015 04/16/2015 03/19/2015 02/26/2015 02/18/2015 02/18/2015 02/18/2015  Height 168 cm -  - - - - -  Weight 76.794 kg - - - - - -  Weight (lbs) 169 lbs 5 oz - - - - - -  BMI (kg/m2) 27.33 kg/m2 - - - - - -  Temp 97.7 96.9 98.1 98.2 - - -  Pulse 74 80 79 77 66 67 71  Resp '17 20 16 17 13 18 15  ' SpO2 100 100 100 100 95 99 98  BSA (m2) 1.89 m2 - - - - - -   BP Readings from Last 3 Encounters:  04/18/15 101/67  04/16/15 111/62  03/19/15 106/62    Physical Exam  Constitutional: She is oriented to person, place, and time and well-developed, well-nourished, and in no distress.  HENT:  Head: Normocephalic and atraumatic.  Mouth/Throat: Oropharynx is clear and moist.  Eyes: Conjunctivae and EOM are normal. Pupils are equal, round, and reactive to light. Right eye exhibits no discharge. Left eye exhibits no discharge. No scleral icterus.  Neck: Normal range of motion. Neck supple. No JVD present. No tracheal deviation present. No thyromegaly present.  Cardiovascular: Normal rate, regular rhythm, normal heart sounds and intact distal pulses.   Pulmonary/Chest: Effort normal and breath sounds normal. No respiratory distress. She has no wheezes. She has no rales. She exhibits no tenderness.  Abdominal: Soft. Bowel sounds are normal. She exhibits no distension and no mass. There is no tenderness. There is no rebound and no guarding.  Musculoskeletal: Normal range of motion. She exhibits no edema or tenderness.  Lymphadenopathy:    She has no cervical adenopathy.  Neurological: She is alert and oriented to person, place, and time.  Skin: Skin is warm and dry. No rash noted. No erythema. No pallor.  Psychiatric: Affect normal.  Nursing note and vitals reviewed.   LABORATORY DATA:. Appointment on 04/16/2015  Component Date Value Ref Range Status  . WBC 04/16/2015 4.3  3.9 - 10.3 10e3/uL Final  . NEUT# 04/16/2015 2.8  1.5 - 6.5 10e3/uL Final  . HGB 04/16/2015 11.0* 11.6 - 15.9 g/dL Final  . HCT 04/16/2015 32.4* 34.8 - 46.6 % Final  . Platelets 04/16/2015 191  145 - 400 10e3/uL  Final  . MCV 04/16/2015 88.6  79.5 - 101.0 fL Final  . MCH 04/16/2015 30.2  25.1 - 34.0 pg Final  . MCHC 04/16/2015 34.1  31.5 - 36.0 g/dL Final  . RBC 04/16/2015 3.66* 3.70 - 5.45 10e6/uL Final  . RDW 04/16/2015 13.9  11.2 - 14.5 % Final  . lymph# 04/16/2015 1.1  0.9 - 3.3 10e3/uL Final  . MONO# 04/16/2015 0.4  0.1 - 0.9 10e3/uL Final  . Eosinophils Absolute 04/16/2015 0.0  0.0 - 0.5 10e3/uL Final  . Basophils Absolute 04/16/2015 0.0  0.0 - 0.1 10e3/uL Final  . NEUT% 04/16/2015 64.2  38.4 - 76.8 % Final  . LYMPH% 04/16/2015 26.0  14.0 - 49.7 % Final  . MONO% 04/16/2015 8.3  0.0 - 14.0 % Final  . EOS% 04/16/2015 0.8  0.0 - 7.0 % Final  . BASO% 04/16/2015 0.7  0.0 - 2.0 % Final  . Sodium 04/16/2015 142  136 - 145 mEq/L Final  . Potassium 04/16/2015 3.8  3.5 - 5.1 mEq/L Final  . Chloride 04/16/2015 110* 98 - 109 mEq/L Final  . CO2 04/16/2015 22  22 - 29 mEq/L Final  . Glucose 04/16/2015 89  70 - 140 mg/dl Final   Glucose reference range is for nonfasting patients. Fasting glucose reference range is 70- 100.  Marland Kitchen BUN 04/16/2015 19.7  7.0 - 26.0 mg/dL Final  . Creatinine 04/16/2015 1.5* 0.6 - 1.1 mg/dL Final  . Total Bilirubin 04/16/2015 0.53  0.20 - 1.20 mg/dL Final  . Alkaline Phosphatase 04/16/2015 121  40 - 150 U/L Final  . AST 04/16/2015 16  5 - 34 U/L Final  . ALT 04/16/2015 15  0 - 55 U/L Final  . Total Protein 04/16/2015 6.9  6.4 - 8.3 g/dL Final  . Albumin 04/16/2015 4.4  3.5 - 5.0 g/dL Final  . Calcium 04/16/2015 9.2  8.4 - 10.4 mg/dL Final  . Anion Gap 04/16/2015 10  3 - 11 mEq/L Final  . EGFR 04/16/2015 39* >90 ml/min/1.73 m2 Final   eGFR is calculated using the CKD-EPI Creatinine Equation (2009)     RADIOGRAPHIC STUDIES: Dg Hips Bilat With Pelvis 3-4 Views  04/18/2015   CLINICAL DATA:  Pain. History of breast carcinoma. No history of trauma  EXAM: DG HIP (WITH OR WITHOUT PELVIS) 3-4V BILAT  COMPARISON:  None.  FINDINGS: Frontal pelvis as well as frontal and lateral hips  bilaterally were obtained. No fracture or dislocation. There is no appreciable joint space narrowing. No erosive change. No blastic or lytic bone lesions.  IMPRESSION: No demonstrable fracture or dislocation. No appreciable arthropathy. No neoplastic foci appreciable.   Electronically Signed   By: Lowella Grip III M.D.   On: 04/18/2015 17:00    ASSESSMENT/PLAN:    Renal insufficiency Patient has a history of chronic renal insufficiency; and creatinine today has increased from 1.3-1.5.  Patient was encouraged to push fluids at home is much as possible.  We'll continue to monitor closely.  Diarrhea Patient states that she developed acute onset diarrhea with stool incontinence earlier this afternoon.  She also reports fairly acute onset of low back and pelvic region discomfort.  She denies any urine incontinence.  On exam today there is no obvious weakness or other neurological deficit.  Patient states that she is currently on Cipro antibiotics for treatment of urinary tract infection; and states that she will not complete the antibiotics until this coming Friday, 04/19/2015.  Patient states that all  of her UTI symptoms have since resolved.  Patient denies any recent fevers or chills.  Patient was advised to hold any further antibiotics.  Also, ordered a stool for C. difficile as well.  Breast cancer of upper-outer quadrant of left female breast Patient received her last Herceptin infusion on 04/10/2015.  Labs obtained today revealed a WBC of 4.3, ANC 2.8, hemoglobin 11.0, and platelet count 191.  Patient is scheduled to return on 04/30/2015 for labs, visit, and her next cycle of Herceptin.  Back pain Patient developed an acute episode of diarrhea with stool incontinence and low back pain earlier this afternoon.  She denies any known injury or trauma.  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  Patient denies any further stool  incontinence or urinary incontinence.  She denies any numbness, tingling, or obvious weakness.  She also denies any recent fevers or chills.  On exam.-Patient has no obvious tenderness with palpation to her lower back or pelvic region.  Abdomen remains soft with bowel sounds positive in all 4 quads.  There is no flank pain.  Patient was prescribed pain medication per her request.  Patient was advised to bring in a stool sample to rule out C. difficile first thing in the morning if at all possible.  Patient was advised to go directly to the emergency department overnight.  If she develops any worsening symptoms whatsoever.  Patient stated understanding of all instructions; and was in agreement with this plan of care. The patient knows to call the clinic with any problems, questions or concerns.   Review/collaboration with Dr. Burr Medico regarding all aspects of patient's visit today.   Total time spent with patient was 40 minutes;  with greater than 75 percent of that time spent in face to face counseling regarding patient's symptoms,  and coordination of care and follow up.  Disclaimer:This dictation was prepared with Dragon/digital dictation along with [employer]. Any transcriptional errors that result from this process are unintentional.  Drue Second, NP 04/19/2015

## 2015-04-19 NOTE — Assessment & Plan Note (Signed)
Patient developed an acute episode of diarrhea with stool incontinence and low back pain earlier this afternoon.  She denies any known injury or trauma.  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  Patient denies any further stool incontinence or urinary incontinence.  She denies any numbness, tingling, or obvious weakness.  She also denies any recent fevers or chills.  On exam.-Patient has no obvious tenderness with palpation to her lower back or pelvic region.  Abdomen remains soft with bowel sounds positive in all 4 quads.  There is no flank pain.  Patient was prescribed pain medication per her request.  Patient was advised to bring in a stool sample to rule out C. difficile first thing in the morning if at all possible.  Patient was advised to go directly to the emergency department overnight.  If she develops any worsening symptoms whatsoever.

## 2015-04-19 NOTE — Assessment & Plan Note (Signed)
Patient states that she developed acute onset diarrhea with stool incontinence earlier this afternoon.  She also reports fairly acute onset of low back and pelvic region discomfort.  She denies any urine incontinence.  On exam today there is no obvious weakness or other neurological deficit.  Patient states that she is currently on Cipro antibiotics for treatment of urinary tract infection; and states that she will not complete the antibiotics until this coming Friday, 04/19/2015.  Patient states that all of her UTI symptoms have since resolved.  Patient denies any recent fevers or chills.  Reviewed all findings with Dr. Jana Hakim in detail today.  Advised patient that it is quite possible that patient is suffering with acute onset viral illness or food poisoning.  Patient was advised to hold any further antibiotics.  Also, ordered a stool for C. difficile as well.  Update: Patient continues with occasional diarrhea; but no further stool incontinence.  Patient denies any abdominal or pelvic pain now.  Patient continues to complain of some significant low back pain.  Stool for C. difficile was negative.  Patient was advised to continue to take Imodium as directed.

## 2015-04-19 NOTE — Assessment & Plan Note (Addendum)
Patient states that she developed acute onset diarrhea with stool incontinence earlier this afternoon.  She also reports fairly acute onset of low back and pelvic region discomfort.  She denies any urine incontinence.  On exam today there is no obvious weakness or other neurological deficit.  Patient states that she is currently on Cipro antibiotics for treatment of urinary tract infection; and states that she will not complete the antibiotics until this coming Friday, 04/19/2015.  Patient states that all of her UTI symptoms have since resolved.  Patient denies any recent fevers or chills.  Reviewed all findings with Dr. Jana Hakim in detail today.  Advised patient that it is quite possible that patient is suffering with acute onset viral illness or food poisoning.  Patient was advised to hold any further antibiotics.  Also, ordered a stool for C. difficile as well.

## 2015-04-19 NOTE — Assessment & Plan Note (Signed)
Patient has a history of chronic renal insufficiency; and creatinine today has increased from 1.3-1.5.  Patient was encouraged to push fluids at home is much as possible.  We'll continue to monitor closely.

## 2015-04-19 NOTE — Assessment & Plan Note (Signed)
Patient received her last Herceptin infusion on 04/10/2015.  Labs obtained today revealed a WBC of 4.3, ANC 2.8, hemoglobin 11.0, and platelet count 191.  Patient is scheduled to return on 04/30/2015 for labs, visit, and her next cycle of Herceptin.

## 2015-04-19 NOTE — Assessment & Plan Note (Signed)
Patient received her last Herceptin infusion on 04/10/2015.  Labs obtained earlier this week revealed a WBC of 4.3, ANC 2.8, hemoglobin 11.0, and platelet count 191.  Patient is scheduled to return on 04/30/2015 for labs, visit, and her next cycle of Herceptin.

## 2015-04-19 NOTE — Telephone Encounter (Signed)
Symptom management clinic nurse call the patient to advise her that her pelvis/hip x-rays were normal.  Also, advised patient that her stool for C. difficile was negative.  This Stacy Fernandez called patient back again this afternoon to advise of these same findings.  Advised patient via telephone message to go directly to the emergency department over the weekend if she develops any worsening symptoms whatsoever.  Also advised patient would call patient back first thing Monday morning 04/22/2015 to check in with her.

## 2015-04-19 NOTE — Progress Notes (Signed)
SYMPTOM MANAGEMENT CLINIC   HPI: Stacy Fernandez 54 y.o. female diagnosed with breast cancer.  Currently undergoing Herceptin therapy.  Patient developed an acute episode of diarrhea with stool incontinence and low back pain earlier this afternoon.  She denies any known injury or trauma.  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  Patient denies any further stool incontinence or urinary incontinence.  She denies any numbness, tingling, or obvious weakness.  She also denies any recent fevers or chills.  Update: Patient returned to the [facility] today for follow-up.  She reports that she continues with some occasional diarrhea; but no further stool incontinence.  She also continues to deny any urinary incontinence.  She denies any urinary tract infection type symptoms as well.  She now reports some mild upper right by numbness and tingling on occasion.  She continues to complain of significant low back pain.  She denies any recent fevers or chills.  Diarrhea     Review of Systems  Gastrointestinal: Positive for diarrhea.    Past Medical History  Diagnosis Date  . Hypothyroidism   . Anxiety     Due to situation, CA breast  . Wears glasses   . Wears dentures     upper  . Migraines   . COPD (chronic obstructive pulmonary disease)     denies SOB with ADLs  . Hematuria     cause unknow- saw Urologist  . History of breast cancer 03/2014    left  . History of pneumonia 09/2014  . History of chemotherapy     finished chemo 09/2014  . Neuropathic pain of both feet     due to effects of chemo  . History of gastroesophageal reflux (GERD)     no problems since Nissen fundoplication  . Supraclinoid carotid artery aneurysm, small     left - 2 mm; states is being monitored  . Cancer 02/2014    left breast    Past Surgical History  Procedure Laterality Date  . Cholecystectomy    . Appendectomy    . Laparoscopic nissen  fundoplication  11/7844    for GERD- not a problem  . Video bronchoscopy Bilateral 01/16/2013    Procedure: VIDEO BRONCHOSCOPY WITHOUT FLUORO;  Surgeon: [name], MD;  Location: [facility];  Service: Cardiopulmonary;  Laterality: Bilateral;  . Mastectomy complete / simple w/ sentinel node biopsy Left 04/02/2014    axillary SLN  . Mastectomy complete / simple Right 04/02/2014    PROPHYLACTIC  . Portacath placement Right 04/02/2014  . Total abdominal hysterectomy      partial   . Simple mastectomy with axillary sentinel node biopsy Bilateral 04/02/2014    Procedure: LEFT TOTAL MASTECTOMY WITH LEFT AXILLARY SENTINEL NODE BIOPSY, RIGHT PROPHYLACTIC MASTETCTOMY;  Surgeon: [name], MD;  Location: [facility];  Service: General;  Laterality: Bilateral;  . Portacath placement N/A 04/02/2014    Procedure: INSERTION PORT-A-CATH;  Surgeon: [name], MD;  Location: [facility];  Service: General;  Laterality: N/A;  . Left heart catheterization with coronary angiogram N/A 12/26/2012    Procedure: LEFT HEART CATHETERIZATION WITH CORONARY ANGIOGRAM;  Surgeon: Lorretta Harp, MD;  Location: [facility] CATH LAB;  Service: Cardiovascular;  Laterality: N/A;  . Breast reconstruction with placement of tissue expander and flex hd (acellular hydrated dermis) Bilateral 09/25/2014    Procedure: PLACEMENT OF BILATERAL TISSUE EXPANDER AND  ACELLULAR DERMIS FOR BREAST RECONSTRUCTION ;  Surgeon:  [name], MD;  Location: [facility];  Service: Plastics;  Laterality: Bilateral;  . Bilateral salpingoophorectomy    . Cardiac catheterization  12/26/2012    "essentially normal coronary arteries, normal LV function"  . Nissen fundoplication  08/16/7865  . Esophagogastroduodenoscopy (egd) with propofol  01/18/2013  . Removal of bilateral tissue expanders with placement of bilateral breast implants Bilateral 12/18/2014    Procedure: REMOVAL OF BILATERAL TISSUE EXPANDERS,PLACEMENT SILICONE IMPLANTS ;  Surgeon: [name], MD;  Location: [facility];  Service: Plastics;  Laterality: Bilateral;  . Liposuction with lipofilling Bilateral 12/18/2014    Procedure: LIPOFILLING TO BILATERAL CHEST;  Surgeon: [name], MD;  Location: [facility];  Service: Plastics;  Laterality: Bilateral;    has Chest pain; Hypothyroidism; Tobacco abuse; COPD (chronic obstructive pulmonary disease); S/P Nissen fundoplication (without gastrostomy tube) procedure; COPD exacerbation; Intractable migraine without aura; CAD (coronary artery disease); Breast cancer of upper-outer quadrant of left female breast; Family history of malignant neoplasm of gastrointestinal tract; Family history of malignant neoplasm of breast; Family history of malignant neoplasm of ovary; Pain at surgical site; Diarrhea; Mucositis due to chemotherapy; Irregular bowel habits; Anemia in neoplastic disease; Atherosclerosis; Insomnia; Neuropathy due to chemotherapeutic drug; Chemotherapy induced neutropenia; Acquired absence of breast and nipple; Hospital-acquired pneumonia; Ankle edema; Lung nodule; Back pain; and Renal insufficiency on her problem list.    is allergic to contrast media and prednisone.    Medication List       This list is accurate as of: 04/18/15 11:59 PM.  Always use your most recent med list.               buPROPion 300 MG 24 hr tablet  Commonly known as:  WELLBUTRIN XL  Take 300 mg by mouth daily.     DUONEB 0.5-2.5 (3) MG/3ML Soln  Generic drug:  ipratropium-albuterol  Inhale 3 mLs into the lungs every 6 (six) hours as needed (for shortness of breath).     fluticasone 50 MCG/ACT nasal spray  Commonly known as:  FLONASE  Place 1 spray into both nostrils daily as needed for allergies or rhinitis.     furosemide 20 MG tablet  Commonly known as:  LASIX  Take 0.5 tablets (10 mg total) by mouth daily.     gabapentin 300 MG capsule  Commonly known as:  NEURONTIN  TAKE 1 CAPSULE (300 MG TOTAL)  BY MOUTH 4 (FOUR) TIMES DAILY.     Garcinia Cambogia-Chromium 500-200 MG-MCG Tabs  Take 1 tablet by mouth 2 (two) times daily.     HERCEPTIN IV  Inject 462 mg into the vein every 21 ( twenty-one) days.     ibuprofen 400 MG tablet  Commonly known as:  ADVIL,MOTRIN  Take 1 tablet (400 mg total) by mouth every 8 (eight) hours as needed for moderate pain.     KLOR-CON M20 20 MEQ tablet  Generic drug:  potassium chloride SA  TAKE 1 TABLET (20 MEQ TOTAL) BY MOUTH ONCE.     levothyroxine 125 MCG tablet  Commonly known as:  SYNTHROID, LEVOTHROID  Take 125 mcg by mouth daily before breakfast.     lidocaine-prilocaine cream  Commonly known as:  EMLA  Apply 1 application topically as needed (for chemo injections).     LORazepam 0.5 MG tablet  Commonly known as:  ATIVAN  Take 1 tablet (0.5 mg total) by mouth at bedtime as needed (Nausea or vomiting).     montelukast 10 MG tablet  Commonly known as:  SINGULAIR  Take 10 mg by mouth at bedtime.     ondansetron 4 MG tablet  Commonly known as:  ZOFRAN  Take 1 tablet (4 mg total) by mouth every 6 (six) hours as needed for nausea or vomiting.     oxyCODONE 5 MG immediate release tablet  Commonly known as:  Oxy IR/ROXICODONE  Take 1-2 tablets (5-10 mg total) by mouth every 4 (four) hours as needed for moderate pain.     oxyCODONE-acetaminophen 5-325 MG tablet  Commonly known as:  PERCOCET  Take 1 tablet by mouth every 4 (four) hours as needed for moderate pain.     PROAIR HFA 108 (90 BASE) MCG/ACT inhaler  Generic drug:  albuterol  Inhale 2 puffs into the lungs every 4 (four) hours as needed for wheezing or shortness of breath.     rizatriptan 5 MG disintegrating tablet  Commonly known as:  MAXALT-MLT  TAKE 1 TABLET (5 MG TOTAL) BY MOUTH AS NEEDED FOR MIGRAINE. MAY REPEAT IN 2 HOURS IF NEEDED     senna-docusate 8.6-50 MG tablet  Commonly known as:  Senokot-S  Take 1 tablet by mouth at bedtime as needed for mild constipation.      SUMAtriptan 6 MG/0.5ML Soln injection  Commonly known as:  IMITREX  Inject 6 mg into the skin every 2 (two) hours as needed for migraine or headache. May repeat in 2 hours if headache persists or recurs. Max 2 doses in 24 hours     traZODone 100 MG tablet  Commonly known as:  DESYREL  TAKE 1 TABLET (100 MG TOTAL) BY MOUTH AT BEDTIME.     verapamil 120 MG CR tablet  Commonly known as:  CALAN-SR  TAKE 1 TABLET BY MOUTH AT BEDTIME ( MAX 30 DAY SUPPLY PER INSURANCE)         PHYSICAL EXAMINATION  Oncology Vitals 04/18/2015 04/16/2015 03/19/2015 02/26/2015 02/18/2015 02/18/2015 02/18/2015  Height 168 cm - - - - - -  Weight 76.794 kg - - - - - -  Weight (lbs) 169 lbs 5 oz - - - - - -  BMI (kg/m2) 27.33 kg/m2 - - - - - -  Temp 97.7 96.9 98.1 98.2 - - -  Pulse 74 80 79 77 66 67 71  Resp '17 20 16 17 13 18 15  ' SpO2 100 100 100 100 95 99 98  BSA (m2) 1.89 m2 - - - - - -   BP Readings from Last 3 Encounters:  04/18/15 101/67  04/16/15 111/62  03/19/15 106/62    Physical Exam  Constitutional: She is oriented to person, place, and time and well-developed, well-nourished, and in no distress.  HENT:  Head: Normocephalic and atraumatic.  Mouth/Throat: Oropharynx is clear and moist.  Eyes: Conjunctivae and EOM are normal. Pupils are equal, round, and reactive to light. Right eye exhibits no discharge. Left eye exhibits no discharge. No scleral icterus.  Neck: Normal range of motion. Neck supple. No JVD present. No tracheal deviation present. No thyromegaly present.  Cardiovascular: Normal rate, regular rhythm, normal heart sounds and intact distal pulses.   Pulmonary/Chest: Effort normal and breath sounds normal. No respiratory distress. She has no wheezes. She has no rales. She exhibits no tenderness.  Abdominal: Soft. Bowel sounds are normal. She exhibits no distension and no mass. There is no tenderness. There is no rebound and no guarding.  Musculoskeletal: Normal range of motion. She exhibits no  edema or tenderness.  Lymphadenopathy:  She has no cervical adenopathy.  Neurological: She is alert and oriented to person, place, and time.  Skin: Skin is warm and dry. No rash noted. No erythema. No pallor.  Psychiatric: Affect normal.  Nursing note and vitals reviewed.   LABORATORY DATA:. Appointment on 04/16/2015  Component Date Value Ref Range Status  . WBC 04/16/2015 4.3  3.9 - 10.3 10e3/uL Final  . NEUT# 04/16/2015 2.8  1.5 - 6.5 10e3/uL Final  . HGB 04/16/2015 11.0* 11.6 - 15.9 g/dL Final  . HCT 04/16/2015 32.4* 34.8 - 46.6 % Final  . Platelets 04/16/2015 191  145 - 400 10e3/uL Final  . MCV 04/16/2015 88.6  79.5 - 101.0 fL Final  . MCH 04/16/2015 30.2  25.1 - 34.0 pg Final  . MCHC 04/16/2015 34.1  31.5 - 36.0 g/dL Final  . RBC 04/16/2015 3.66* 3.70 - 5.45 10e6/uL Final  . RDW 04/16/2015 13.9  11.2 - 14.5 % Final  . lymph# 04/16/2015 1.1  0.9 - 3.3 10e3/uL Final  . MONO# 04/16/2015 0.4  0.1 - 0.9 10e3/uL Final  . Eosinophils Absolute 04/16/2015 0.0  0.0 - 0.5 10e3/uL Final  . Basophils Absolute 04/16/2015 0.0  0.0 - 0.1 10e3/uL Final  . NEUT% 04/16/2015 64.2  38.4 - 76.8 % Final  . LYMPH% 04/16/2015 26.0  14.0 - 49.7 % Final  . MONO% 04/16/2015 8.3  0.0 - 14.0 % Final  . EOS% 04/16/2015 0.8  0.0 - 7.0 % Final  . BASO% 04/16/2015 0.7  0.0 - 2.0 % Final  . Sodium 04/16/2015 142  136 - 145 mEq/L Final  . Potassium 04/16/2015 3.8  3.5 - 5.1 mEq/L Final  . Chloride 04/16/2015 110* 98 - 109 mEq/L Final  . CO2 04/16/2015 22  22 - 29 mEq/L Final  . Glucose 04/16/2015 89  70 - 140 mg/dl Final   Glucose reference range is for nonfasting patients. Fasting glucose reference range is 70- 100.  [place] Kitchen BUN 04/16/2015 19.7  7.0 - 26.0 mg/dL Final  . Creatinine 04/16/2015 1.5* 0.6 - 1.1 mg/dL Final  . Total Bilirubin 04/16/2015 0.53  0.20 - 1.20 mg/dL Final  . Alkaline Phosphatase 04/16/2015 121  40 - 150 U/L Final  . AST 04/16/2015 16  5 - 34 U/L Final  . ALT 04/16/2015 15  0 - 55 U/L  Final  . Total Protein 04/16/2015 6.9  6.4 - 8.3 g/dL Final  . Albumin 04/16/2015 4.4  3.5 - 5.0 g/dL Final  . Calcium 04/16/2015 9.2  8.4 - 10.4 mg/dL Final  . Anion Gap 04/16/2015 10  3 - 11 mEq/L Final  . EGFR 04/16/2015 39* >90 ml/min/1.73 m2 Final   eGFR is calculated using the CKD-EPI Creatinine Equation (2009)     RADIOGRAPHIC STUDIES: Dg Hips Bilat With Pelvis 3-4 Views  04/18/2015   CLINICAL DATA:  Pain. History of breast carcinoma. No history of trauma  EXAM: DG HIP (WITH OR WITHOUT PELVIS) 3-4V BILAT  COMPARISON:  None.  FINDINGS: Frontal pelvis as well as frontal and lateral hips bilaterally were obtained. No fracture or dislocation. There is no appreciable joint space narrowing. No erosive change. No blastic or lytic bone lesions.  IMPRESSION: No demonstrable fracture or dislocation. No appreciable arthropathy. No neoplastic foci appreciable.   Electronically Signed   By: Lowella Grip III M.D.   On: 04/18/2015 17:00    ASSESSMENT/PLAN:    Diarrhea Patient states that she developed acute onset diarrhea with stool incontinence earlier this afternoon.  She also  reports fairly acute onset of low back and pelvic region discomfort.  She denies any urine incontinence.  On exam today there is no obvious weakness or other neurological deficit.  Patient states that she is currently on Cipro antibiotics for treatment of urinary tract infection; and states that she will not complete the antibiotics until this coming Friday, 04/19/2015.  Patient states that all of her UTI symptoms have since resolved.  Patient denies any recent fevers or chills.  Reviewed all findings with Dr. Jana Hakim in detail today.  Advised patient that it is quite possible that patient is suffering with acute onset viral illness or food poisoning.  Patient was advised to hold any further antibiotics.  Also, ordered a stool for C. difficile as well.  Update: Patient continues with occasional diarrhea; but no  further stool incontinence.  Patient denies any abdominal or pelvic pain now.  Patient continues to complain of some significant low back pain.  Stool for C. difficile was negative.  Patient was advised to continue to take Imodium as directed.    Breast cancer of upper-outer quadrant of left female breast Patient received her last Herceptin infusion on 04/10/2015.  Labs obtained earlier this week revealed a WBC of 4.3, ANC 2.8, hemoglobin 11.0, and platelet count 191.  Patient is scheduled to return on 04/30/2015 for labs, visit, and her next cycle of Herceptin.    Back pain Patient developed an acute episode of diarrhea with stool incontinence and low back pain earlier this afternoon.  She denies any known injury or trauma.  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  Patient denies any further stool incontinence or urinary incontinence.  She denies any numbness, tingling, or obvious weakness.  She also denies any recent fevers or chills.  On exam.-Patient has no obvious tenderness with palpation to her lower back or pelvic region.  Abdomen remains soft with bowel sounds positive in all 4 quads.  There is no flank pain.  Patient was prescribed pain medication per her request.  Patient was advised to bring in a stool sample to rule out C. difficile first thing in the morning if at all possible.  Patient was advised to go directly to the emergency department overnight.  If she develops any worsening symptoms whatsoever.  Update: Patient returned to the [facility] today for follow-up.  She reports that she continues with some occasional diarrhea; but no further stool incontinence.  She also continues to deny any urinary incontinence.  She denies any urinary tract infection type symptoms as well.  She now reports some mild upper right by numbness and tingling on occasion.  She continues to complain of significant low back pain.  She  denies any recent fevers or chills.  On exam.  There is no obvious tenderness with palpation to the low back.  However, patient does experience increased pain to the low back with ambulation.  There is no obvious neurological deficits or weakness on exam.  Restaging CT obtained 02/18/15 revealed no acute issues to the abdomen, pelvis, or the low back.   Obtained a bilateral hips and pelvis plain film; which was negative for any acute findings.  Reviewed all findings with Dr. Burr Medico  (on call MD) this afternoon.  All of patient's symptoms could very well be related to a viral illness; or secondary to a back injury.  Advised patient and her husband to continue with pain medication as directed.  Also  advised patient to go directly to the emergency department if she develops any worsening symptoms whatsoever.       Patient stated understanding of all instructions; and was in agreement with this plan of care. The patient knows to call the clinic with any problems, questions or concerns.   Review/collaboration with Dr. Burr Medico regarding all aspects of patient's visit today.   Total time spent with patient was 40 minutes;  with greater than 75 percent of that time spent in face to face counseling regarding patient's symptoms,  and coordination of care and follow up.  Disclaimer:This dictation was prepared with Dragon/digital dictation along with [employer]. Any transcriptional errors that result from this process are unintentional.  Drue Second, NP 04/19/2015

## 2015-04-19 NOTE — Assessment & Plan Note (Addendum)
Patient developed an acute episode of diarrhea with stool incontinence and low back pain earlier this afternoon.  She denies any known injury or trauma.  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  Patient denies any further stool incontinence or urinary incontinence.  She denies any numbness, tingling, or obvious weakness.  She also denies any recent fevers or chills.  On exam.-Patient has no obvious tenderness with palpation to her lower back or pelvic region.  Abdomen remains soft with bowel sounds positive in all 4 quads.  There is no flank pain.  Patient was prescribed pain medication per her request.  Patient was advised to bring in a stool sample to rule out C. difficile first thing in the morning if at all possible.  Patient was advised to go directly to the emergency department overnight.  If she develops any worsening symptoms whatsoever.  Update: Patient returned to the [facility] today for follow-up.  She reports that she continues with some occasional diarrhea; but no further stool incontinence.  She also continues to deny any urinary incontinence.  She denies any urinary tract infection type symptoms as well.  She now reports some mild upper right by numbness and tingling on occasion.  She continues to complain of significant low back pain.  She denies any recent fevers or chills.  On exam.  There is no obvious tenderness with palpation to the low back.  However, patient does experience increased pain to the low back with ambulation.  There is no obvious neurological deficits or weakness on exam.  Restaging CT obtained 02/18/15 revealed no acute issues to the abdomen, pelvis, or the low back.   Obtained a bilateral hips and pelvis plain film; which was negative for any acute findings.  Reviewed all findings with Dr. Burr Medico  (on call MD) this afternoon.  All of patient's symptoms could very well be related to a viral illness; or  secondary to a back injury.  Advised patient and her husband to continue with pain medication as directed.  Also advised patient to go directly to the emergency department if she develops any worsening symptoms whatsoever.

## 2015-04-22 ENCOUNTER — Other Ambulatory Visit: Payer: Self-pay | Admitting: Nurse Practitioner

## 2015-04-22 ENCOUNTER — Ambulatory Visit (HOSPITAL_BASED_OUTPATIENT_CLINIC_OR_DEPARTMENT_OTHER): Payer: BLUE CROSS/BLUE SHIELD | Admitting: Nurse Practitioner

## 2015-04-22 ENCOUNTER — Telehealth: Payer: Self-pay | Admitting: *Deleted

## 2015-04-22 ENCOUNTER — Ambulatory Visit (HOSPITAL_COMMUNITY)
Admission: RE | Admit: 2015-04-22 | Discharge: 2015-04-22 | Disposition: A | Discharge status: Home / Self Care | Payer: BLUE CROSS/BLUE SHIELD | Source: Ambulatory Visit | Attending: Nurse Practitioner | Admitting: Nurse Practitioner

## 2015-04-22 ENCOUNTER — Other Ambulatory Visit: Payer: Self-pay | Admitting: *Deleted

## 2015-04-22 ENCOUNTER — Ambulatory Visit (HOSPITAL_BASED_OUTPATIENT_CLINIC_OR_DEPARTMENT_OTHER): Payer: BLUE CROSS/BLUE SHIELD

## 2015-04-22 ENCOUNTER — Encounter: Payer: Self-pay | Admitting: Nurse Practitioner

## 2015-04-22 VITALS — BP 113/70 | HR 82 | Temp 98.2°F

## 2015-04-22 DIAGNOSIS — E860 Dehydration: Secondary | ICD-10-CM

## 2015-04-22 DIAGNOSIS — R197 Diarrhea, unspecified: Secondary | ICD-10-CM

## 2015-04-22 DIAGNOSIS — M542 Cervicalgia: Secondary | ICD-10-CM | POA: Insufficient documentation

## 2015-04-22 DIAGNOSIS — C50412 Malignant neoplasm of upper-outer quadrant of left female breast: Secondary | ICD-10-CM

## 2015-04-22 DIAGNOSIS — M545 Low back pain, unspecified: Secondary | ICD-10-CM

## 2015-04-22 DIAGNOSIS — N823 Fistula of vagina to large intestine: Secondary | ICD-10-CM

## 2015-04-22 MED ORDER — SODIUM CHLORIDE 0.9 % IV SOLN
1500.0000 mL | Freq: Once | INTRAVENOUS | Status: DC
  Administered 2015-04-22: 1500 mL via INTRAVENOUS

## 2015-04-22 MED ORDER — SODIUM CHLORIDE 0.9 % IJ SOLN
10.0000 mL | INTRAMUSCULAR | Status: DC | PRN
  Administered 2015-04-22: 10 mL via INTRAVENOUS
  Filled 2015-04-22: qty 10

## 2015-04-22 MED ORDER — HEPARIN SOD (PORK) LOCK FLUSH 100 UNIT/ML IV SOLN
500.0000 [IU] | Freq: Once | INTRAVENOUS | Status: AC
  Administered 2015-04-22: 500 [IU] via INTRAVENOUS
  Filled 2015-04-22: qty 5

## 2015-04-22 MED ORDER — MORPHINE SULFATE 4 MG/ML IJ SOLN
2.0000 mg | Freq: Once | INTRAMUSCULAR | Status: AC
  Administered 2015-04-22: 2 mg via INTRAVENOUS
  Filled 2015-04-22: qty 1

## 2015-04-22 MED ORDER — GADOBENATE DIMEGLUMINE 529 MG/ML IV SOLN
8.0000 mL | Freq: Once | INTRAVENOUS | Status: AC | PRN
  Administered 2015-04-22: 8 mL via INTRAVENOUS

## 2015-04-22 MED ORDER — OXYCODONE HCL 5 MG PO TABS: 5.0000 mg | ORAL_TABLET | Freq: Four times a day (QID) | ORAL | Status: DC | PRN

## 2015-04-22 MED ORDER — MORPHINE SULFATE (PF) 4 MG/ML IV SOLN
INTRAVENOUS | Status: AC
  Filled 2015-04-22: qty 1

## 2015-04-22 MED ORDER — DIPHENOXYLATE-ATROPINE 2.5-0.025 MG PO TABS: 2.0000 | ORAL_TABLET | Freq: Four times a day (QID) | ORAL | Status: DC | PRN

## 2015-04-22 NOTE — Progress Notes (Signed)
SYMPTOM MANAGEMENT CLINIC   HPI: Stacy Fernandez 54 y.o. female diagnosed with breast cancer.  Currently undergoing Herceptin therapy.  Patient developed an acute episode of diarrhea with stool incontinence and low back pain this past Tuesday 04/16/15.  She denies any known injury or trauma.  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  Patient states that she has experienced 2 additional episodes of stool incontinence this past weekend..  She denies any numbness, tingling, or obvious weakness.  She also denies any recent fevers or chills.  Diarrhea     Review of Systems  Gastrointestinal: Positive for diarrhea.    Past Medical History  Diagnosis Date  . Hypothyroidism   . Anxiety     Due to situation, CA breast  . Wears glasses   . Wears dentures     upper  . Migraines   . COPD (chronic obstructive pulmonary disease) ([place])     denies SOB with ADLs  . Hematuria     cause unknow- saw Urologist  . History of breast cancer 03/2014    left  . History of pneumonia 09/2014  . History of chemotherapy     finished chemo 09/2014  . Neuropathic pain of both feet ([place])     due to effects of chemo  . History of gastroesophageal reflux (GERD)     no problems since Nissen fundoplication  . 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 Cancer [facility]) 02/2014    left breast    Past Surgical History  Procedure Laterality Date  . Cholecystectomy    . Appendectomy    . Laparoscopic nissen fundoplication  11/4625    for GERD- not a problem  . Video bronchoscopy Bilateral 01/16/2013    Procedure: VIDEO BRONCHOSCOPY WITHOUT FLUORO;  Surgeon: [name], MD;  Location: [facility];  Service: Cardiopulmonary;  Laterality: Bilateral;  . Mastectomy complete / simple w/ sentinel node biopsy Left 04/02/2014    axillary SLN  . Mastectomy complete / simple Right 04/02/2014    PROPHYLACTIC    . Portacath placement Right 04/02/2014  . Total abdominal hysterectomy      partial   . Simple mastectomy with axillary sentinel node biopsy Bilateral 04/02/2014    Procedure: LEFT TOTAL MASTECTOMY WITH LEFT AXILLARY SENTINEL NODE BIOPSY, RIGHT PROPHYLACTIC MASTETCTOMY;  Surgeon: [name], MD;  Location: [place];  Service: General;  Laterality: Bilateral;  . Portacath placement N/A 04/02/2014    Procedure: INSERTION PORT-A-CATH;  Surgeon: [name], MD;  Location: [place];  Service: General;  Laterality: N/A;  . Left heart catheterization with coronary angiogram N/A 12/26/2012    Procedure: LEFT HEART CATHETERIZATION WITH CORONARY ANGIOGRAM;  Surgeon: Lorretta Harp, MD;  Location: [facility] CATH LAB;  Service: Cardiovascular;  Laterality: N/A;  . Breast reconstruction with placement of tissue expander and flex hd (acellular hydrated dermis) Bilateral 09/25/2014    Procedure: PLACEMENT OF BILATERAL TISSUE EXPANDER AND  ACELLULAR DERMIS FOR BREAST RECONSTRUCTION ;  Surgeon: [name], MD;  Location: [place];  Service: Plastics;  Laterality: Bilateral;  . Bilateral salpingoophorectomy    . Cardiac catheterization  12/26/2012    "essentially normal coronary arteries, normal LV function"  . Nissen fundoplication  0/35/0093  . Esophagogastroduodenoscopy (egd) with propofol  01/18/2013  . Removal of bilateral tissue expanders with placement of bilateral breast implants Bilateral 12/18/2014  Procedure: REMOVAL OF BILATERAL TISSUE EXPANDERS,PLACEMENT SILICONE IMPLANTS ;  Surgeon: [name], MD;  Location: [place];  Service: Plastics;  Laterality: Bilateral;  . Liposuction with lipofilling Bilateral 12/18/2014    Procedure: LIPOFILLING TO BILATERAL CHEST;  Surgeon: [name], MD;  Location: [place];  Service: Plastics;  Laterality: Bilateral;    has Chest pain; Hypothyroidism; Tobacco abuse; COPD (chronic obstructive pulmonary disease)  (HCC); S/P Nissen fundoplication (without gastrostomy tube) procedure; COPD exacerbation ([place]); Intractable migraine without aura; CAD (coronary artery disease); Breast cancer of upper-outer quadrant of left female breast ([place]); Family history of malignant neoplasm of gastrointestinal tract; Family history of malignant neoplasm of breast; Family history of malignant neoplasm of ovary; Pain at surgical site; Diarrhea; Mucositis due to chemotherapy; Irregular bowel habits; Anemia in neoplastic disease; Atherosclerosis; Insomnia; Neuropathy due to chemotherapeutic drug ([place]); Chemotherapy induced neutropenia ([place]); Acquired absence of breast and nipple; Hospital-acquired pneumonia; Ankle edema; Lung nodule; Back pain; Renal insufficiency; and Dehydration on her problem list.    is allergic to contrast media and prednisone.    Medication List       This list is accurate as of: 04/22/15  8:42 PM.  Always use your most recent med list.               buPROPion 300 MG 24 hr tablet  Commonly known as:  WELLBUTRIN XL  Take 300 mg by mouth daily.     diphenoxylate-atropine 2.5-0.025 MG tablet  Commonly known as:  LOMOTIL  Take 2 tablets by mouth 4 (four) times daily as needed for diarrhea or loose stools.     DUONEB 0.5-2.5 (3) MG/3ML Soln  Generic drug:  ipratropium-albuterol  Inhale 3 mLs into the lungs every 6 (six) hours as needed (for shortness of breath).     fluticasone 50 MCG/ACT nasal spray  Commonly known as:  FLONASE  Place 1 spray into both nostrils daily as needed for allergies or rhinitis.     furosemide 20 MG tablet  Commonly known as:  LASIX  Take 0.5 tablets (10 mg total) by mouth daily.     gabapentin 300 MG capsule  Commonly known as:  NEURONTIN  TAKE 1 CAPSULE (300 MG TOTAL) BY MOUTH 4 (FOUR) TIMES DAILY.     Garcinia Cambogia-Chromium 500-200 MG-MCG Tabs  Take 1 tablet by mouth 2 (two) times daily.     HERCEPTIN IV  Inject 462 mg into the vein every 21 ( twenty-one)  days.     ibuprofen 400 MG tablet  Commonly known as:  ADVIL,MOTRIN  Take 1 tablet (400 mg total) by mouth every 8 (eight) hours as needed for moderate pain.     KLOR-CON M20 20 MEQ tablet  Generic drug:  potassium chloride SA  TAKE 1 TABLET (20 MEQ TOTAL) BY MOUTH ONCE.     levothyroxine 125 MCG tablet  Commonly known as:  SYNTHROID, LEVOTHROID  Take 125 mcg by mouth daily before breakfast.     lidocaine-prilocaine cream  Commonly known as:  EMLA  Apply 1 application topically as needed (for chemo injections).     LORazepam 0.5 MG tablet  Commonly known as:  ATIVAN  Take 1 tablet (0.5 mg total) by mouth at bedtime as needed (Nausea or vomiting).     montelukast 10 MG tablet  Commonly known as:  SINGULAIR  Take 10 mg by mouth at bedtime.     ondansetron 4 MG tablet  Commonly known as:  ZOFRAN  Take 1  tablet (4 mg total) by mouth every 6 (six) hours as needed for nausea or vomiting.     oxyCODONE 5 MG immediate release tablet  Commonly known as:  Oxy IR/ROXICODONE  Take 1-2 tablets (5-10 mg total) by mouth every 6 (six) hours as needed for moderate pain.     oxyCODONE-acetaminophen 5-325 MG tablet  Commonly known as:  PERCOCET  Take 1 tablet by mouth every 4 (four) hours as needed for moderate pain.     PROAIR HFA 108 (90 BASE) MCG/ACT inhaler  Generic drug:  albuterol  Inhale 2 puffs into the lungs every 4 (four) hours as needed for wheezing or shortness of breath.     rizatriptan 5 MG disintegrating tablet  Commonly known as:  MAXALT-MLT  TAKE 1 TABLET (5 MG TOTAL) BY MOUTH AS NEEDED FOR MIGRAINE. MAY REPEAT IN 2 HOURS IF NEEDED     senna-docusate 8.6-50 MG tablet  Commonly known as:  Senokot-S  Take 1 tablet by mouth at bedtime as needed for mild constipation.     SUMAtriptan 6 MG/0.5ML Soln injection  Commonly known as:  IMITREX  Inject 6 mg into the skin every 2 (two) hours as needed for migraine or headache. May repeat in 2 hours if headache persists or  recurs. Max 2 doses in 24 hours     traZODone 100 MG tablet  Commonly known as:  DESYREL  TAKE 1 TABLET (100 MG TOTAL) BY MOUTH AT BEDTIME.     verapamil 120 MG CR tablet  Commonly known as:  CALAN-SR  TAKE 1 TABLET BY MOUTH AT BEDTIME ( MAX 30 DAY SUPPLY PER INSURANCE)         PHYSICAL EXAMINATION  Oncology Vitals 04/22/2015 04/18/2015 04/16/2015 03/19/2015 02/26/2015 02/18/2015 02/18/2015  Height - 168 cm - - - - -  Weight - 76.794 kg - - - - -  Weight (lbs) - 169 lbs 5 oz - - - - -  BMI (kg/m2) - 27.33 kg/m2 - - - - -  Temp 98.2 97.7 96.9 98.1 98.2 - -  Pulse 82 74 80 79 77 66 67  Resp - 17 20 16 17 13 18   SpO2 - 100 100 100 100 95 99  BSA (m2) - 1.89 m2 - - - - -   BP Readings from Last 3 Encounters:  04/22/15 113/70  04/18/15 101/67  04/16/15 111/62    Physical Exam  Constitutional: She is oriented to person, place, and time and well-developed, well-nourished, and in no distress.  HENT:  Head: Normocephalic and atraumatic.  Mouth/Throat: Oropharynx is clear and moist.  Eyes: Conjunctivae and EOM are normal. Pupils are equal, round, and reactive to light. Right eye exhibits no discharge. Left eye exhibits no discharge. No scleral icterus.  Neck: Normal range of motion. Neck supple. No JVD present. No tracheal deviation present. No thyromegaly present.  Cardiovascular: Normal rate, regular rhythm, normal heart sounds and intact distal pulses.   Pulmonary/Chest: Effort normal and breath sounds normal. No respiratory distress. She has no wheezes. She has no rales. She exhibits no tenderness.  Abdominal: Soft. Bowel sounds are normal. She exhibits no distension and no mass. There is no tenderness. There is no rebound and no guarding.  Musculoskeletal: Normal range of motion. She exhibits no edema or tenderness.  Lymphadenopathy:    She has no cervical adenopathy.  Neurological: She is alert and oriented to person, place, and time.  Skin: Skin is warm and dry. No rash noted. No  erythema.  No pallor.  Psychiatric: Affect normal.  Nursing note and vitals reviewed.   LABORATORY DATA:. No visits with results within 3 Day(s) from this visit. Latest known visit with results is:  Hospital Outpatient Visit on 04/19/2015  Component Date Value Ref Range Status  . C Diff antigen 04/19/2015 NEGATIVE  NEGATIVE Final  . C Diff toxin 04/19/2015 NEGATIVE  NEGATIVE Final  . C Diff interpretation 04/19/2015 Negative for toxigenic C. difficile   Final     RADIOGRAPHIC STUDIES: Mr Lumbar Spine W Wo Contrast  04/22/2015   CLINICAL DATA:  Low back pain of 1 week duration. Weakness of both legs. Bowel incontinence. Personal history of breast cancer.  EXAM: MRI LUMBAR SPINE WITHOUT AND WITH CONTRAST  TECHNIQUE: Multiplanar and multiecho pulse sequences of the lumbar spine were obtained without and with intravenous contrast.  CONTRAST:  89mL MULTIHANCE GADOBENATE DIMEGLUMINE 529 MG/ML IV SOLN  COMPARISON:  CT 02/18/2015  FINDINGS: Alignment is normal. No evidence of fracture. No evidence of regional metastatic disease.  There are mild, non-compressive disc bulges in the lumbar region. No disc herniation or compressive stenosis. There is mild lower lumbar facet degeneration. The distal cord and conus are normal with conus tip at L2. No evidence of metastatic disease to the distal cord or nerve roots of the lumbar region.  IMPRESSION: No cause of the presenting symptoms is identified. No evidence of regional metastatic disease. Ordinary minimal disc bulges and facet degeneration without evidence of stenosis or neural compression.   Electronically Signed   By: Nelson Chimes M.D.   On: 04/22/2015 20:05    ASSESSMENT/PLAN:    Breast cancer of upper-outer quadrant of left female breast Patient received her last Herceptin infusion on 04/10/2015.  Labs obtained earlier this week revealed a WBC of 4.3, ANC 2.8, hemoglobin 11.0, and platelet count 191.  Patient is scheduled to return on 04/30/2015  for labs, visit, and her next cycle of Herceptin.      Diarrhea Patient states that she developed acute onset diarrhea with stool incontinence this past Tuesday 04/16/15.   She also reports fairly acute onset of low back and pelvic region discomfort.  She denies any urine incontinence.  Patient was treated approximately 10 days ago for urinary tract infection; and completed approximately 7 days of Cipro antibiotics.  Patient denies any UTI symptoms whatsoever at present.   Patient presents back to the [facility] today with complaint of continued, chronic diarrhea.  She states that she also experienced stool incontinence on 2 different occasions over this past weekend as well.  She denies any abdominal discomfort.  On exam.-Patient's abdomen soft and also as possible in all 4 quads.  There is no rebound tenderness.  C. difficile obtained last week was negative.  Patient may very well be experiencing a prolonged viral illness with chronic diarrhea; however.-Due to recent stool incontinence will also obtain a lumbar MRI to rule out cord compression.  Patient has been taking Imodium with only minimal effectiveness.  Patient was given a prescription for Lomotil to try as well.  She was instructed to alternate both the Imodium and Lomotil.              Back pain Patient developed an acute episode of diarrhea with stool incontinence and low back pain this past Tuesday 04/16/15.  She denies any known injury or trauma.  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  Patient states that she  has experienced 2 additional episodes of stool incontinence this past weekend..  She denies any numbness, tingling, or obvious weakness.  She also denies any recent fevers or chills.  On exam.-Patient has no obvious tenderness with palpation to her lower back or pelvic region.  Abdomen remains soft with bowel sounds positive in all 4 quads.  There is no  flank pain.  Due to patient's continued complaint of low back pain and 3 episodes of stool incontinence this past week-patient will obtain a lumbar MRI for further evaluation.  Later this evening.  Patient and her husband were advised to go directly to the emergency department overnight if patient develops any worsening symptoms whatsoever.  Also, patient was given a refill of her oxycodone.  Patient was advised to go directly to the emergency department overnight.  If she develops any worsening symptoms whatsoever.       Bilateral hips and pelvis plain film obtained last week; which was negative for any acute findings.         Dehydration Patient continued to complain of diarrhea; and presented to the [facility] today to receive IV fluid rehydration prior to her lumbar MRI this afternoon.  She was also encouraged to push fluids at home is much as possible.   Patient stated understanding of all instructions; and was in agreement with this plan of care. The patient knows to call the clinic with any problems, questions or concerns.   Review/collaboration with Dr. Jana Hakim regarding all aspects of patient's visit today.   Total time spent with patient was 25 minutes;  with greater than 75 percent of that time spent in face to face counseling regarding patient's symptoms,  and coordination of care and follow up.  Disclaimer:This dictation was prepared with Dragon/digital dictation along with [employer]. Any transcriptional errors that result from this process are unintentional.  Drue Second, NP 04/22/2015

## 2015-04-22 NOTE — Assessment & Plan Note (Signed)
Patient continued to complain of diarrhea; and presented to the [facility] today to receive IV fluid rehydration prior to her lumbar MRI this afternoon.  She was also encouraged to push fluids at home is much as possible.

## 2015-04-22 NOTE — Telephone Encounter (Signed)
IMODIUM AD IS NOT HELPING WITH THE STOOLS. PT. IS STILL HAVING SEVERE PAIN IN HER LOWER BACK AND RECTUM.

## 2015-04-22 NOTE — Assessment & Plan Note (Signed)
Patient received her last Herceptin infusion on 04/10/2015.  Labs obtained earlier this week revealed a WBC of 4.3, ANC 2.8, hemoglobin 11.0, and platelet count 191.  Patient is scheduled to return on 04/30/2015 for labs, visit, and her next cycle of Herceptin.

## 2015-04-22 NOTE — Progress Notes (Signed)
Pt d/c'd with Husband by w/c.  Pt given Rx for oxycodone and lomotil from Selena Lesser, NP.  Husband is taking pt over to Radiology dept for MRI.   I called MRI dept and to ask if I should leave PAC accessed.  MRI Tech requested PAC de-accessed and PIV started for MRI.   Flushed and de-accessed PAC and started PIV to right AC prior to d/c.

## 2015-04-22 NOTE — Assessment & Plan Note (Signed)
Patient states that she developed acute onset diarrhea with stool incontinence this past Tuesday 04/16/15.   She also reports fairly acute onset of low back and pelvic region discomfort.  She denies any urine incontinence.  Patient was treated approximately 10 days ago for urinary tract infection; and completed approximately 7 days of Cipro antibiotics.  Patient denies any UTI symptoms whatsoever at present.   Patient presents back to the [facility] today with complaint of continued, chronic diarrhea.  She states that she also experienced stool incontinence on 2 different occasions over this past weekend as well.  She denies any abdominal discomfort.  On exam.-Patient's abdomen soft and also as possible in all 4 quads.  There is no rebound tenderness.  C. difficile obtained last week was negative.  Patient may very well be experiencing a prolonged viral illness with chronic diarrhea; however.-Due to recent stool incontinence will also obtain a lumbar MRI to rule out cord compression.  Patient has been taking Imodium with only minimal effectiveness.  Patient was given a prescription for Lomotil to try as well.  She was instructed to alternate both the Imodium and Lomotil.

## 2015-04-22 NOTE — Telephone Encounter (Signed)
Pt reported to treatment room for fluids prior to her MRI. Pt states she has not had any complaints with urinary symptoms. She states the immodium regimen has not relieved her diarrhea. Pt has 4-6 loose stool per day and has instances where it wakes her in the night with urgency to have a BM.

## 2015-04-22 NOTE — Assessment & Plan Note (Signed)
Patient developed an acute episode of diarrhea with stool incontinence and low back pain this past Tuesday 04/16/15.  She denies any known injury or trauma.  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  Patient states that she has experienced 2 additional episodes of stool incontinence this past weekend..  She denies any numbness, tingling, or obvious weakness.  She also denies any recent fevers or chills.  On exam.-Patient has no obvious tenderness with palpation to her lower back or pelvic region.  Abdomen remains soft with bowel sounds positive in all 4 quads.  There is no flank pain.  Due to patient's continued complaint of low back pain and 3 episodes of stool incontinence this past week-patient will obtain a lumbar MRI for further evaluation.  Later this evening.  Patient and her husband were advised to go directly to the emergency department overnight if patient develops any worsening symptoms whatsoever.  Also, patient was given a refill of her oxycodone.  Patient was advised to go directly to the emergency department overnight.  If she develops any worsening symptoms whatsoever.       Bilateral hips and pelvis plain film obtained last week; which was negative for any acute findings.

## 2015-04-23 ENCOUNTER — Telehealth: Payer: Self-pay | Admitting: *Deleted

## 2015-04-23 DIAGNOSIS — M5489 Other dorsalgia: Secondary | ICD-10-CM

## 2015-04-23 NOTE — Telephone Encounter (Signed)
Called patient this afternoon to review the lumbar MRI results from the scan.  She obtained earlier today.  Advised patient that MRI results revealed no cause for the presenting symptoms or evidence of regional metastatic disease.  Patient states that she continues to experience significant pain to her very low back; which radiates to her right upper thigh as well.  She denies any new episodes of either urine or stool incontinence.  After reviewing all findings with Dr. Clyda Hurdle order a CT with contrast for the abdomen and pelvis for as soon as possible.  Patient has a stated allergy to contrast media; as well as prednisone.  Reviewed these allergies with patient; and patient advised that she typically undergoes the contrast allergy protocol with both prednisone and Benadryl prior to CT contrast-and does fine.  Advised patient to expect a phone call from central scheduling to arrange for CT scan as soon as possible.  Also, confirmed the patient continues to take oxycodone an as-needed basis; and patient is already taking Neurontin.  Patient was advised to go directly to the emergency department in the meantime if she develops any worsening symptoms whatsoever.

## 2015-04-23 NOTE — Telephone Encounter (Signed)
Patient called "No one has called me with results MRI and Cyndee said she would need to talk with me face to face today."  This nurse informed her MRI results negative.  Collaborated with [facility] nurse.  Selena Lesser and Dr. Jana Hakim are working on treatment plan and will call patient with plan later today.  Return number [phone].

## 2015-04-24 ENCOUNTER — Telehealth: Payer: Self-pay | Admitting: *Deleted

## 2015-04-24 MED ORDER — DIPHENHYDRAMINE HCL 50 MG PO TABS: 50.0000 mg | ORAL_TABLET | ORAL | Status: DC

## 2015-04-24 MED ORDER — PREDNISONE 50 MG PO TABS: ORAL_TABLET | ORAL | Status: DC

## 2015-04-24 NOTE — Telephone Encounter (Signed)
Per CT- order had to be changed and contrast allergy protocol to be sent by ordered provider. Contrast allergy protocol sent to pharmacy. Pt husband advised of appt tomorrow morning 0745 in radiology and directions for protocol. Pt will need to stop by [facility] today to pick up the oral contrast and the history sheet for the appt tomorrow also.

## 2015-04-25 ENCOUNTER — Encounter: Payer: Self-pay | Admitting: Neurology

## 2015-04-25 ENCOUNTER — Ambulatory Visit (HOSPITAL_COMMUNITY)
Admission: RE | Admit: 2015-04-25 | Discharge: 2015-04-25 | Disposition: A | Discharge status: Home / Self Care | Payer: BLUE CROSS/BLUE SHIELD | Source: Ambulatory Visit | Attending: Nurse Practitioner | Admitting: Nurse Practitioner

## 2015-04-25 ENCOUNTER — Ambulatory Visit (INDEPENDENT_AMBULATORY_CARE_PROVIDER_SITE_OTHER): Payer: BLUE CROSS/BLUE SHIELD | Admitting: Neurology

## 2015-04-25 ENCOUNTER — Telehealth: Payer: Self-pay | Admitting: Nurse Practitioner

## 2015-04-25 VITALS — BP 122/72 | HR 80 | Ht 66.0 in | Wt 170.0 lb

## 2015-04-25 DIAGNOSIS — J439 Emphysema, unspecified: Secondary | ICD-10-CM | POA: Insufficient documentation

## 2015-04-25 DIAGNOSIS — M5489 Other dorsalgia: Secondary | ICD-10-CM | POA: Diagnosis present

## 2015-04-25 DIAGNOSIS — I700 Atherosclerosis of aorta: Secondary | ICD-10-CM | POA: Diagnosis not present

## 2015-04-25 DIAGNOSIS — I671 Cerebral aneurysm, nonruptured: Secondary | ICD-10-CM

## 2015-04-25 DIAGNOSIS — C50412 Malignant neoplasm of upper-outer quadrant of left female breast: Secondary | ICD-10-CM | POA: Diagnosis not present

## 2015-04-25 DIAGNOSIS — G43019 Migraine without aura, intractable, without status migrainosus: Secondary | ICD-10-CM

## 2015-04-25 MED ORDER — DICLOFENAC POTASSIUM(MIGRAINE) 50 MG PO PACK: 50.0000 mg | PACK | ORAL | Status: DC | PRN

## 2015-04-25 MED ORDER — IOHEXOL 300 MG/ML  SOLN
100.0000 mL | Freq: Once | INTRAMUSCULAR | Status: AC | PRN
  Administered 2015-04-25: 80 mL via INTRAVENOUS

## 2015-04-25 MED ORDER — SUMATRIPTAN SUCCINATE 6 MG/0.5ML ~~LOC~~ SOLN: 6.0000 mg | SUBCUTANEOUS | Status: DC | PRN

## 2015-04-25 NOTE — Progress Notes (Addendum)
Spoke to Dr. Johnnye Sima nurse at [facility]. She instructed to put in an urgent referral and send it to their office via Epic. They have access to this systems information. Referral has been placed. Per our scheduling dept- pt is able to call and make an appt for ambulatory referrals unrelated to Cancer.  Pt has been advised of referral and given the contact information for [facility]- Dr. Glo Herring [phone]. She will be calling now to schedule appt.

## 2015-04-25 NOTE — Progress Notes (Signed)
PATIENT: Stacy Fernandez DOB: [birth year]  Chief Complaint  Patient presents with  . Aneurysm    She has previously been treated with Botox for her migraines. She is here today because she has not had her aneurysm evaluated since 2014.       HISTORICAL Stacy Fernandez is a 54 year old right-handed white female referred by Stacy Fernandez for Botox injection as migraine prevention, last injection was January 2016  She had frequent headaches since May 2013, she woke up in May, notice left-sided headache, across her left forehead, feel likes that her left eye is going to pop out, she has no visual loss, first severe headache lasts about 2 days, tried numerous over-the-counter medications, including Excedrin Migraine, Tylenol, Aleve without help,  Ever since the initial episode, she has frequent headaches, most on the left side, occasionally on the right side, excruciating pounding headache, with associated light noise sensitivity, nauseous, she often woke up from sleep with headaches  CT head without contrast was normal, MRI cervical showed mild spondylitic disease, but no acute lesions MRI of the brain done previously was normal. MRA of the head showed small 64mm projection near the left supraclinoid internal carotid artery may represent small aneurysm vs. infundibulum of hypoplastic posterior communicating artery  Carotid Doppler study done previously was normal.    Over the past 2 years, she has tried different preventative medications this including propranolol, Topamax, Effexor, without helping,  She still has 6 headaches in a week , moderately severe, lasting for few days  She received last Botox injection as migraine prevention since Jan 2015, responded very well,  She has double msectomectomy for left  brest cancer, chemotherapy, since last injection in August 2015, she has more frequent headaches during the past few months because of her treatment, Maxalt, Imitrex subcutaneous injection was  not as helpful. 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 This is probably incidental, but given the history of some recent bowel incontinence, clinical correlation with any abnormal vaginal discharge is recommended in ruling out anovaginal fistula or fistula-in-ano. Emphysema. Aortoiliac atherosclerotic vascular disease. She will be evaluated by gynecologist soon  She is now complains of frequent left hemisphere headaches, severe, 5 out of 7 days, each headaches last for a few hours to a few days, Maxalt only provide limited help, Imitrex injection works better, but she only gets to injection each months from [facility], Toradol 10 mg as needed only provide limited help.    Previous Botox injection was helpful She is also very worried about previous MRA  Of Small 43mm projection of the left supraclinoid internal carotid artery may represent small aneurysm vs. infundibulum. She wants to have repeat MRA to follow up on the progress   REVIEW OF SYSTEMS: Full 14 system review of systems performed and notable only for chill, fatigue, trouble swallowing, shortness of breath, cough, rectal pain, abdominal pain, frequent awakening, snoring, frequent urination, increased frequency, back pain, walking difficulty, neck pain, headaches, numbness, weakness, decreased concentration, nervousness,  ALLERGIES: Allergies  Allergen Reactions  . Contrast Media [Iodinated Diagnostic Agents] Anaphylaxis  . Prednisone Shortness Of Breath and Swelling    CAN TOLERATE IF GIVEN BENADRYL PRIOR     HOME MEDICATIONS: Current  Outpatient Prescriptions  Medication Sig Dispense Refill  . buPROPion (WELLBUTRIN XL) 300 MG 24 hr tablet Take 300 mg  by mouth daily.    . diphenhydrAMINE (BENADRYL) 50 MG tablet Take 1 tablet (50 mg total) by mouth See admin instructions. Take one tablet (50mg ) 1 hour prior to CT Scan and with each dose of prednisone 30 tablet 0  . DUONEB 0.5-2.5 (3) MG/3ML SOLN Inhale 3 mLs into the lungs every 6 (six) hours as needed (for shortness of breath).   3  . fluticasone (FLONASE) 50 MCG/ACT nasal spray Place 1 spray into both nostrils daily as needed for allergies or rhinitis.    . furosemide (LASIX) 20 MG tablet Take 0.5 tablets (10 mg total) by mouth daily. 30 tablet 0  . gabapentin (NEURONTIN) 300 MG capsule TAKE 1 CAPSULE (300 MG TOTAL) BY MOUTH 4 (FOUR) TIMES DAILY. (Patient taking differently: TAKE 1 CAPSULE (300 MG TOTAL) BY MOUTH 3) 120 capsule 3  . Garcinia Cambogia-Chromium 500-200 MG-MCG TABS Take 1 tablet by mouth 2 (two) times daily.    Marland Kitchen ibuprofen (ADVIL,MOTRIN) 400 MG tablet Take 1 tablet (400 mg total) by mouth every 8 (eight) hours as needed for moderate pain. 60 tablet 0  . KLOR-CON M20 20 MEQ tablet TAKE 1 TABLET (20 MEQ TOTAL) BY MOUTH ONCE. (Patient taking differently: TAKE 1 TABLET (20 MEQ TOTAL) BY MOUTH ONCE DAY) 30 tablet 1  . levothyroxine (SYNTHROID, LEVOTHROID) 125 MCG tablet Take 125 mcg by mouth daily before breakfast.    . lidocaine-prilocaine (EMLA) cream Apply 1 application topically as needed (for chemo injections).   0  . LORazepam (ATIVAN) 0.5 MG tablet Take 1 tablet (0.5 mg total) by mouth at bedtime as needed (Nausea or vomiting). 30 tablet 0  . montelukast (SINGULAIR) 10 MG tablet Take 10 mg by mouth at bedtime.     . ondansetron (ZOFRAN) 4 MG tablet Take 1 tablet (4 mg total) by mouth every 6 (six) hours as needed for nausea or vomiting. 20 tablet 0  . oxyCODONE (OXY IR/ROXICODONE) 5 MG immediate release tablet Take 1-2 tablets (5-10 mg total) by mouth every 6 (six) hours as needed for moderate pain. 50 tablet 0  . PROAIR HFA 108 (90 BASE) MCG/ACT inhaler Inhale 2 puffs into the  lungs every 4 (four) hours as needed for wheezing or shortness of breath.     . rizatriptan (MAXALT-MLT) 5 MG disintegrating tablet TAKE 1 TABLET (5 MG TOTAL) BY MOUTH AS NEEDED FOR MIGRAINE. MAY REPEAT IN 2 HOURS IF NEEDED 15 tablet 5  . SUMAtriptan (IMITREX) 6 MG/0.5ML SOLN injection Inject 6 mg into the skin every 2 (two) hours as needed for migraine or headache. May repeat in 2 hours if headache persists or recurs. Max 2 doses in 24 hours    . Trastuzumab (HERCEPTIN IV) Inject 462 mg into the vein every 21 ( twenty-one) days.    . traZODone (DESYREL) 100 MG tablet TAKE 1 TABLET (100 MG TOTAL) BY MOUTH AT BEDTIME. 30 tablet 1  . verapamil (CALAN-SR) 120 MG CR tablet TAKE 1 TABLET BY MOUTH AT BEDTIME ( MAX 30 DAY SUPPLY PER INSURANCE) (Patient taking differently: Take 120 mg by mouth at bedtime. TAKE 1 TABLET BY MOUTH AT BEDTIME ( MAX 30 DAY SUPPLY PER INSURANCE)) 30 tablet 6   No current facility-administered medications for this visit.    PAST MEDICAL HISTORY: Past Medical History  Diagnosis Date  . Hypothyroidism   . Anxiety     Due  to situation, CA breast  . Wears glasses   . Wears dentures     upper  . Migraines   . COPD (chronic obstructive pulmonary disease) ([place])     denies SOB with ADLs  . Hematuria     cause unknow- saw Urologist  . History of breast cancer 03/2014    left  . History of pneumonia 09/2014  . History of chemotherapy     finished chemo 09/2014  . Neuropathic pain of both feet ([place])     due to effects of chemo  . History of gastroesophageal reflux (GERD)     no problems since Nissen fundoplication  . 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 Cancer ([place]) 02/2014    left breast    PAST SURGICAL HISTORY: Past Surgical History  Procedure Laterality Date  . Cholecystectomy    . Appendectomy    . Laparoscopic nissen fundoplication  03/9370    for GERD- not a problem  . Video bronchoscopy Bilateral  01/16/2013    Procedure: VIDEO BRONCHOSCOPY WITHOUT FLUORO;  Surgeon: [name], MD;  Location: [facility];  Service: Cardiopulmonary;  Laterality: Bilateral;  . Mastectomy complete / simple w/ sentinel node biopsy Left 04/02/2014    axillary SLN  . Mastectomy complete / simple Right 04/02/2014    PROPHYLACTIC  . Portacath placement Right 04/02/2014  . Total abdominal hysterectomy      partial   . Simple mastectomy with axillary sentinel node biopsy Bilateral 04/02/2014    Procedure: LEFT TOTAL MASTECTOMY WITH LEFT AXILLARY SENTINEL NODE BIOPSY, RIGHT PROPHYLACTIC MASTETCTOMY;  Surgeon: [name], MD;  Location: [facility];  Service: General;  Laterality: Bilateral;  . Portacath placement N/A 04/02/2014    Procedure: INSERTION PORT-A-CATH;  Surgeon: [name], MD;  Location: [facility];  Service: General;  Laterality: N/A;  . Left heart catheterization with coronary angiogram N/A 12/26/2012    Procedure: LEFT HEART CATHETERIZATION WITH CORONARY ANGIOGRAM;  Surgeon: Lorretta Harp, MD;  Location: [facility];  Service: Cardiovascular;  Laterality: N/A;  . Breast reconstruction with placement of tissue expander and flex hd (acellular hydrated dermis) Bilateral 09/25/2014    Procedure: PLACEMENT OF BILATERAL TISSUE EXPANDER AND  ACELLULAR DERMIS FOR BREAST RECONSTRUCTION ;  Surgeon: [name], MD;  Location: [facility];  Service: Plastics;  Laterality: Bilateral;  . Bilateral salpingoophorectomy    . Cardiac catheterization  12/26/2012    "essentially normal coronary arteries, normal LV function"  . Nissen fundoplication  6/96/7893  . Esophagogastroduodenoscopy (egd) with propofol  01/18/2013  . Removal of bilateral tissue expanders with placement of bilateral breast implants Bilateral 12/18/2014    Procedure: REMOVAL OF BILATERAL TISSUE EXPANDERS,PLACEMENT SILICONE IMPLANTS ;  Surgeon: [name], MD;  Location: [facility];  Service: Plastics;  Laterality:  Bilateral;  . Liposuction with lipofilling Bilateral 12/18/2014    Procedure: LIPOFILLING TO BILATERAL CHEST;  Surgeon: [name], MD;  Location: [facility];  Service: Plastics;  Laterality: Bilateral;    FAMILY HISTORY: Family History  Problem Relation Age of Onset  . Heart failure Mother   . Colon cancer Father 1    stomach cancer also in 75s  . Throat cancer Brother 33    smoker  . Heart attack Maternal Grandmother   . Colon cancer Paternal Grandmother 31  . Cancer Paternal Grandfather     kidney and bladder  . Throat cancer  Brother 37    throat cancer, smoker  . Ovarian cancer Sister 23    ovarian cancer at 23, colorectal cancer at 9  . Breast cancer Paternal Aunt 73  . Ovarian cancer Other 25    niece with ovarian cancer    SOCIAL HISTORY:  Social History   Social History  . Marital Status: Married    Spouse Name: [name]   . Number of Children: 4  . Years of Education: GED   Occupational History  .     Social History Main Topics  . Smoking status: Former Smoker -- 0.00 packs/day for 37 years    Quit date: 03/29/2014  . Smokeless tobacco: Never Used     Comment: uses vapor occasionally  . Alcohol Use: No  . Drug Use: No  . Sexual Activity: Not on file   Other Topics Concern  . Not on file   Social History Narrative   Patient lives at home with her husband [name]) Patient works full time.   Education- GED   Right handed.   Caffeine- one cup of coffee daily.     PHYSICAL EXAM   Filed Vitals:   04/25/15 1634  BP: 122/72  Pulse: 80  Height: 5\' 6"  (1.676 m)  Weight: 170 lb (77.111 kg)    Not recorded      Body mass index is 27.45 kg/(m^2).  PHYSICAL EXAMNIATION:  Gen: NAD, conversant, well nourised, obese, well groomed                     Cardiovascular: Regular rate rhythm, no peripheral edema, warm, nontender. Eyes: Conjunctivae clear without exudates or hemorrhage Neck: Supple, no carotid bruise. Pulmonary: Clear  to auscultation bilaterally   NEUROLOGICAL EXAM:  MENTAL STATUS: Speech:    Speech is normal; fluent and spontaneous with normal comprehension.  Cognition:     Orientation to time, place and person     Normal recent and remote memory     Normal Attention span and concentration     Normal Language, naming, repeating,spontaneous speech     Fund of knowledge   CRANIAL NERVES: CN II: Visual fields are full to confrontation. Fundoscopic exam is normal with sharp discs and no vascular changes. Pupils are round equal and briskly reactive to light. CN III, IV, VI: extraocular movement are normal. No ptosis. CN V: Facial sensation is intact to pinprick in all 3 divisions bilaterally. Corneal responses are intact.  CN VII: Face is symmetric with normal eye closure and smile. CN VIII: Hearing is normal to rubbing fingers CN IX, X: Palate elevates symmetrically. Phonation is normal. CN XI: Head turning and shoulder shrug are intact CN XII: Tongue is midline with normal movements and no atrophy.  MOTOR: There is no pronator drift of out-stretched arms. Muscle bulk and tone are normal. Muscle strength is normal.  REFLEXES: Reflexes are 2+ and symmetric at the biceps, triceps, knees, and ankles. Plantar responses are flexor.  SENSORY: Intact to light touch, pinprick, position sense, and vibration sense are intact in fingers and toes.  COORDINATION: Rapid alternating movements and fine finger movements are intact. There is no dysmetria on finger-to-nose and heel-knee-shin.    GAIT/STANCE: Antalgic due to low back, abdominal pain,   DIAGNOSTIC DATA (LABS, IMAGING, TESTING) - I reviewed patient records, labs, notes, testing and imaging myself where available.   ASSESSMENT AND PLAN  Stacy Fernandez is a 54 y.o. female   Chronic migraine  Resume Botox injection as migraine  prevention preauthorization paperwork today, will return to clinic for injection after she is approved  Imitrex  injection as needed for abortive treatment , [place] as needed  Small brain aneurysm   repeat MRA of brain  Marcial Pacas, M.D. Ph.D.  [facility] [address], [place], [place] [address] Ph: [phone] Fax: [phone]  CC: Referring Provider

## 2015-04-25 NOTE — Telephone Encounter (Signed)
Called patient to review CT with contrast of the abdomen and pelvis scan which was obtained earlier this morning.  CT scan revealed:   IMPRESSION: 1. No findings of metastatic breast cancer. 2. There is a small amount of gas at the introitus and along the anterior anal margin. This is probably incidental, but given the history of some recent bowel incontinence, clinical correlation with any abnormal vaginal discharge is recommended in ruling out anovaginal fistula or fistula-in-ano. 3. Emphysema. 4. Aortoiliac atherosclerotic vascular disease.  Patient states that she does intermittently have some abnormal vaginal discharge; and also has a history of chronic hematuria.  Patient states that she continues to experience some occasional diarrhea; but has had no further stool incontinence since this past weekend.  Reviewed all findings with Dr.Gudena-been advised patient would assist in obtaining a GYN consult for further evaluation as soon as possible.  Also, patient states she has had a colonoscopy per Dr. Collene Mares within the last few months as well.  Advised patient may very well want to follow-up with Dr. Collene Mares regarding her continued complaints as well.  Patient was also advised to go directly to the emergency department for any worsening symptoms whatsoever.

## 2015-04-25 NOTE — Addendum Note (Signed)
Addended by: Jethro Bolus A on: 04/25/2015 01:29 PM   Modules accepted: Orders

## 2015-04-28 ENCOUNTER — Other Ambulatory Visit: Payer: Self-pay | Admitting: Oncology

## 2015-04-29 ENCOUNTER — Other Ambulatory Visit: Payer: Self-pay

## 2015-04-29 DIAGNOSIS — C50412 Malignant neoplasm of upper-outer quadrant of left female breast: Secondary | ICD-10-CM

## 2015-04-30 ENCOUNTER — Ambulatory Visit (HOSPITAL_BASED_OUTPATIENT_CLINIC_OR_DEPARTMENT_OTHER): Payer: BLUE CROSS/BLUE SHIELD

## 2015-04-30 ENCOUNTER — Other Ambulatory Visit: Payer: Self-pay | Admitting: General Surgery

## 2015-04-30 ENCOUNTER — Ambulatory Visit (HOSPITAL_BASED_OUTPATIENT_CLINIC_OR_DEPARTMENT_OTHER): Payer: BLUE CROSS/BLUE SHIELD | Admitting: Oncology

## 2015-04-30 ENCOUNTER — Other Ambulatory Visit (HOSPITAL_BASED_OUTPATIENT_CLINIC_OR_DEPARTMENT_OTHER): Payer: BLUE CROSS/BLUE SHIELD

## 2015-04-30 ENCOUNTER — Telehealth: Payer: Self-pay | Admitting: Oncology

## 2015-04-30 ENCOUNTER — Ambulatory Visit: Payer: BLUE CROSS/BLUE SHIELD

## 2015-04-30 VITALS — BP 115/61 | HR 76 | Temp 97.6°F | Resp 18 | Ht 66.0 in | Wt 169.9 lb

## 2015-04-30 DIAGNOSIS — C50812 Malignant neoplasm of overlapping sites of left female breast: Secondary | ICD-10-CM

## 2015-04-30 DIAGNOSIS — Z171 Estrogen receptor negative status [ER-]: Secondary | ICD-10-CM

## 2015-04-30 DIAGNOSIS — N183 Chronic kidney disease, stage 3 (moderate): Secondary | ICD-10-CM

## 2015-04-30 DIAGNOSIS — Z5112 Encounter for antineoplastic immunotherapy: Secondary | ICD-10-CM

## 2015-04-30 DIAGNOSIS — M25559 Pain in unspecified hip: Secondary | ICD-10-CM | POA: Insufficient documentation

## 2015-04-30 DIAGNOSIS — C50412 Malignant neoplasm of upper-outer quadrant of left female breast: Secondary | ICD-10-CM

## 2015-04-30 DIAGNOSIS — G620 Drug-induced polyneuropathy: Secondary | ICD-10-CM

## 2015-04-30 LAB — CBC WITH DIFFERENTIAL/PLATELET
BASO%: 0.8 % (ref 0.0–2.0)
Basophils Absolute: 0 10e3/uL (ref 0.0–0.1)
EOS%: 2 % (ref 0.0–7.0)
Eosinophils Absolute: 0.1 10e3/uL (ref 0.0–0.5)
HCT: 32.8 % — ABNORMAL LOW (ref 34.8–46.6)
HGB: 11.1 g/dL — ABNORMAL LOW (ref 11.6–15.9)
LYMPH%: 26 % (ref 14.0–49.7)
MCH: 30.2 pg (ref 25.1–34.0)
MCHC: 34 g/dL (ref 31.5–36.0)
MCV: 88.8 fL (ref 79.5–101.0)
MONO#: 0.3 10e3/uL (ref 0.1–0.9)
MONO%: 10 % (ref 0.0–14.0)
NEUT#: 1.7 10e3/uL (ref 1.5–6.5)
NEUT%: 61.2 % (ref 38.4–76.8)
Platelets: 202 10e3/uL (ref 145–400)
RBC: 3.69 10e6/uL — ABNORMAL LOW (ref 3.70–5.45)
RDW: 13.8 % (ref 11.2–14.5)
WBC: 2.8 10e3/uL — ABNORMAL LOW (ref 3.9–10.3)
lymph#: 0.7 10e3/uL — ABNORMAL LOW (ref 0.9–3.3)

## 2015-04-30 LAB — COMPREHENSIVE METABOLIC PANEL (CC13)
ALT: 14 U/L (ref 0–55)
AST: 12 U/L (ref 5–34)
Albumin: 4 g/dL (ref 3.5–5.0)
Alkaline Phosphatase: 108 U/L (ref 40–150)
Anion Gap: 7 mEq/L (ref 3–11)
BILIRUBIN TOTAL: 0.54 mg/dL (ref 0.20–1.20)
BUN: 13 mg/dL (ref 7.0–26.0)
CHLORIDE: 113 meq/L — AB (ref 98–109)
CO2: 23 meq/L (ref 22–29)
CREATININE: 1.1 mg/dL (ref 0.6–1.1)
Calcium: 9.2 mg/dL (ref 8.4–10.4)
EGFR: 55 mL/min/{1.73_m2} — AB (ref >90)
GLUCOSE: 78 mg/dL (ref 70–140)
Potassium: 4.9 mEq/L (ref 3.5–5.1)
SODIUM: 143 meq/L (ref 136–145)
TOTAL PROTEIN: 6.4 g/dL (ref 6.4–8.3)

## 2015-04-30 MED ORDER — DIPHENHYDRAMINE HCL 25 MG PO CAPS
25.0000 mg | ORAL_CAPSULE | Freq: Once | ORAL | Status: AC
  Administered 2015-04-30: 25 mg via ORAL

## 2015-04-30 MED ORDER — ACETAMINOPHEN 325 MG PO TABS
ORAL_TABLET | ORAL | Status: AC
  Filled 2015-04-30: qty 2

## 2015-04-30 MED ORDER — DIPHENHYDRAMINE HCL 25 MG PO CAPS
ORAL_CAPSULE | ORAL | Status: AC
  Filled 2015-04-30: qty 1

## 2015-04-30 MED ORDER — ACETAMINOPHEN 325 MG PO TABS
650.0000 mg | ORAL_TABLET | Freq: Once | ORAL | Status: AC
  Administered 2015-04-30: 650 mg via ORAL

## 2015-04-30 MED ORDER — HEPARIN SOD (PORK) LOCK FLUSH 100 UNIT/ML IV SOLN
500.0000 [IU] | Freq: Once | INTRAVENOUS | Status: AC | PRN
  Administered 2015-04-30: 500 [IU]
  Filled 2015-04-30: qty 5

## 2015-04-30 MED ORDER — SODIUM CHLORIDE 0.9 % IV SOLN
Freq: Once | INTRAVENOUS | Status: AC
  Administered 2015-04-30: 10:00:00 via INTRAVENOUS

## 2015-04-30 MED ORDER — TRASTUZUMAB CHEMO INJECTION 440 MG
6.0000 mg/kg | Freq: Once | INTRAVENOUS | Status: AC
  Administered 2015-04-30: 462 mg via INTRAVENOUS
  Filled 2015-04-30: qty 22

## 2015-04-30 MED ORDER — SODIUM CHLORIDE 0.9 % IJ SOLN
10.0000 mL | INTRAMUSCULAR | Status: DC | PRN
  Administered 2015-04-30: 10 mL
  Filled 2015-04-30: qty 10

## 2015-04-30 NOTE — Telephone Encounter (Signed)
Appointments made and ref faxed to [employer] [facility]

## 2015-04-30 NOTE — Progress Notes (Signed)
[place]  [phone] [phone]     ID: Stacy Fernandez DOB: 20-Oct-1960  MR#: [id number]  ZJI#:[id number]  Patient Care Team: No Pcp Per Patient as PCP - General (General Practice) [name], MD as PCP - Family Medicine (Nurse Practitioner) [name], MD as Consulting Physician (Radiation Oncology) [name], MD as Consulting Physician (General Surgery) [name], MD as Consulting Physician (Oncology) [name], MD as Consulting Physician (Gastroenterology) [name], MD as Consulting Physician (Pulmonary Disease) OTHER MD:  [name] MD, [name] MD  CHIEF COMPLAINT: HER-2 positive, 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 She brought it to her primary physician's attention him a and on 02/21/2014 she underwent bilateral diagnostic mammography with tomography at [facility]. The breast composition was category B. In the left breast at the 3:00 position there was an irregular mass associated with skin retraction. Ultrasound performed on the same day confirmed a 1.1 cm irregular solid mass in the left breast at the 3:00 position. There were no abnormalities by sonography in the left axilla or in the superior portion of the breast, which is for the patient experiences some tenderness.  Biopsy of the left breast area in question 02/22/2014 showed ([id number]) and invasive ductal carcinoma, grade 3, estrogen and progesterone receptor negative, with an MIB-1 of 83%, and HER-2 amplified, the signals ratio being 2.07, and a copy number per cell 4.75.  On 02/28/2014 the patient underwent bilateral breast MRI, which showed in the posterior third of the left breast at the 3:00 position an irregular spiculated mass  measuring 1.8 cm. The left breast was unremarkable and there were no abnormal appearing lymph nodes.  The patient's subsequent history is as detailed below  INTERVAL HISTORY: Stacy Fernandez returns today for follow up of her breast cancer, accompanied by her husband Stacy Fernandez. She completes a year of trastuzumab today. She has tolerated this treatment well, and her most recent echocardiogram in July showed a well preserved ejection fraction.  However since August Stacy Fernandez has been complaining of abdominal pain. We obtained a CT scan of the abdomen and pelvis 02/18/2015 and this was entirely unremarkable. Of course that showed the prior Nissen fundoplication. The pain however persisted and on September 29 we obtain plain hip films bilaterally. There was no evidence of fracture, dislocation, joint space narrowing, erosive change, or any blastic or lytic bone lesion in the pelvis or hips. Again as the pain persisted (and the patient complained of bowel incontinence) an MRI of the lumbar spine was obtained 04/22/2015. This showed mild degenerative disc disease, but no disc herniation, and specifically no evidence of metastatic disease to the distal cord or nerve roots. The distal cord and conus are normal with the conus step at L2. With continuing back pain we repeated a CT scan of the abdomen and pelvis 04/25/2015. This showed no evidence of metastatic cancer. There was a small amount of gas at the introitus and along the anterior anal margin. This was felt to be possibly incidental, but with a history of bowel incontinence the possibility of an anovaginal fistula or a fistula in ano was suggested. An appointment with gynecology (Dr Benna Dunks) has been scheduled for 05/02/2015.   EVIEW OF SYSTEMS:  Stacy Fernandez's symptoms have persisted and now she tells me she has  feculent material sometimes coming out of the vagina as well as per rectum. 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 Despite this she continues to work full-time. She is  cutting down on fiber to minimize the bowel movements and she is basically not eating at work. She still has pain in the lower back and lower legs. The right big toenail has turned black, but only half of it. No other toenails have changed color. She's not aware of any trauma there. She has seasonal sinus problems, occasionally has difficulty swallowing, and she still a little bit short of breath when climbing stairs, but not more then at baseline. She has a history of blood in the urine and stress urinary incontinence. She status post remote hysterectomy of course. Her bladder has "dropped". She has rare headaches. There has been no evidence of fever, erythema, or swelling. A detailed review of systems today was otherwise stable  PAST MEDICAL HISTORY: Past Medical History  Diagnosis Date  . Hypothyroidism   . Anxiety     Due to situation, CA breast  . Wears glasses   . Wears dentures     upper  . Migraines   . COPD (chronic obstructive pulmonary disease) ([place])     denies SOB with ADLs  . Hematuria     cause unknow- saw Urologist  . History of breast cancer 03/2014    left  . History of pneumonia 09/2014  . History of chemotherapy     finished chemo 09/2014  . Neuropathic pain of both feet ([place])     due to effects of chemo  . History of gastroesophageal reflux (GERD)     no problems since Nissen fundoplication  . 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 Cancer ([place]) 02/2014    left breast    PAST SURGICAL HISTORY: Past Surgical History  Procedure Laterality Date  . Cholecystectomy    . Appendectomy    . Laparoscopic nissen fundoplication  11/4268    for GERD- not a problem  . Video bronchoscopy Bilateral 01/16/2013    Procedure: VIDEO BRONCHOSCOPY WITHOUT FLUORO;  Surgeon: [name], MD;  Location: [facility];  Service: Cardiopulmonary;  Laterality: Bilateral;  . Mastectomy complete / simple w/ sentinel node biopsy Left  04/02/2014    axillary SLN  . Mastectomy complete / simple Right 04/02/2014    PROPHYLACTIC  . Portacath placement Right 04/02/2014  . Total abdominal hysterectomy      partial   . Simple mastectomy with axillary sentinel node biopsy Bilateral 04/02/2014    Procedure: LEFT TOTAL MASTECTOMY WITH LEFT AXILLARY SENTINEL NODE BIOPSY, RIGHT PROPHYLACTIC MASTETCTOMY;  Surgeon: [name], MD;  Location: [facility];  Service: General;  Laterality: Bilateral;  . Portacath placement N/A 04/02/2014    Procedure: INSERTION PORT-A-CATH;  Surgeon: [name], MD;  Location: [facility];  Service: General;  Laterality: N/A;  . Left heart catheterization with coronary angiogram N/A 12/26/2012    Procedure: LEFT HEART CATHETERIZATION WITH CORONARY ANGIOGRAM;  Surgeon: Lorretta Harp, MD;  Location: [facility] CATH LAB;  Service: Cardiovascular;  Laterality: N/A;  . Breast reconstruction with placement of tissue expander and flex hd (acellular hydrated dermis) Bilateral 09/25/2014    Procedure: PLACEMENT OF BILATERAL TISSUE EXPANDER AND  ACELLULAR DERMIS FOR BREAST RECONSTRUCTION ;  Surgeon: [name], MD;  Location: [place];  Service: Plastics;  Laterality: Bilateral;  . Bilateral salpingoophorectomy    .  Cardiac catheterization  12/26/2012    "essentially normal coronary arteries, normal LV function"  . Nissen fundoplication  5/62/5638  . Esophagogastroduodenoscopy (egd) with propofol  01/18/2013  . Removal of bilateral tissue expanders with placement of bilateral breast implants Bilateral 12/18/2014    Procedure: REMOVAL OF BILATERAL TISSUE EXPANDERS,PLACEMENT SILICONE IMPLANTS ;  Surgeon: [name], MD;  Location: [place];  Service: Plastics;  Laterality: Bilateral;  . Liposuction with lipofilling Bilateral 12/18/2014    Procedure: LIPOFILLING TO BILATERAL CHEST;  Surgeon: [name], MD;  Location: [place];  Service: Plastics;  Laterality: Bilateral;     FAMILY HISTORY Family History  Problem Relation Age of Onset  . Heart failure Mother   . Colon cancer Father 70    stomach cancer also in 39s  . Throat cancer Brother 32    smoker  . Heart attack Maternal Grandmother   . Colon cancer Paternal Grandmother 2  . Cancer Paternal Grandfather     kidney and bladder  . Throat cancer Brother 37    throat cancer, smoker  . Ovarian cancer Sister 62    ovarian cancer at 46, colorectal cancer at 65  . Breast cancer Paternal Aunt 99  . Ovarian cancer Other 65    niece with ovarian cancer   the patient's father died at the age of 3 from metastatic stomach cancer the patient's father's mother died from colon cancer at the age of 69. The patient's mother died at the age of 24. The patient had 2 brothers and 2 sisters. One brother died at the age of 71 from throat cancer metastatic to the lung. He was a smoker. A second brother Was diagnosed at age 45 with throat cancer metastatic to lung. He has been given 90 days to live" is not looking good". One sister died from colon cancer metastatic to bone. One sister died of a drug overdose. There is no history of breast or bearing cancer in the family to the patient's knowledge.  GYNECOLOGIC HISTORY:  No LMP recorded. Patient has had a hysterectomy. Menarche age 57, first live birth age 48, the patient is [place] P4. She underwent hysterectomy with bilateral salpingo-oophorectomy approximately 1990. She took hormone replacement for approximately 2 years.  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 Her (second) husband, Stacy Fernandez, is retired. The patient has 2 sons and 2 daughters from an earlier marriage, all living nearby him a and of course she has 4 stepchildren through her current husband. Altogether they have 19 grandchildren. She attends a local [employer]: Not  in place   HEALTH MAINTENANCE: Social History  Substance Use Topics  . Smoking status: Former Smoker -- 0.00 packs/day for 37 years    Quit date: 03/29/2014  . Smokeless tobacco: Never Used     Comment: uses vapor occasionally  . Alcohol Use: No     Colonoscopy: August 2015  PAP: May 2013  Bone density:  Lipid panel:  Allergies  Allergen Reactions  . Contrast Media [Iodinated Diagnostic Agents] Anaphylaxis  . Prednisone Shortness Of Breath and Swelling    CAN TOLERATE IF GIVEN BENADRYL PRIOR     Current Outpatient Prescriptions  Medication Sig Dispense Refill  . buPROPion (WELLBUTRIN XL) 300 MG 24 hr tablet Take 300 mg by mouth daily.    . diphenhydrAMINE (BENADRYL) 50 MG tablet  Take 1 tablet (50 mg total) by mouth See admin instructions. Take one tablet (12m) 1 hour prior to CT Scan and with each dose of prednisone 30 tablet 0  . DUONEB 0.5-2.5 (3) MG/3ML SOLN Inhale 3 mLs into the lungs every 6 (six) hours as needed (for shortness of breath).   3  . fluticasone (FLONASE) 50 MCG/ACT nasal spray Place 1 spray into both nostrils daily as needed for allergies or rhinitis.    . furosemide (LASIX) 20 MG tablet Take 0.5 tablets (10 mg total) by mouth daily. 30 tablet 0  . gabapentin (NEURONTIN) 300 MG capsule TAKE 1 CAPSULE (300 MG TOTAL) BY MOUTH 4 (FOUR) TIMES DAILY. (Patient taking differently: TAKE 1 CAPSULE (300 MG TOTAL) BY MOUTH 3) 120 capsule 3  . Garcinia Cambogia-Chromium 500-200 MG-MCG TABS Take 1 tablet by mouth 2 (two) times daily.    .Marland KitchenKLOR-CON M20 20 MEQ tablet TAKE 1 TABLET (20 MEQ TOTAL) BY MOUTH ONCE. (Patient taking differently: TAKE 1 TABLET (20 MEQ TOTAL) BY MOUTH ONCE DAY) 30 tablet 1  . levothyroxine (SYNTHROID, LEVOTHROID) 125 MCG tablet Take 125 mcg by mouth daily before breakfast.    . lidocaine-prilocaine (EMLA) cream Apply 1 application topically as needed (for chemo injections).   0  . LORazepam (ATIVAN) 0.5 MG tablet Take 1 tablet (0.5 mg total) by mouth  at bedtime as needed (Nausea or vomiting). 30 tablet 0  . montelukast (SINGULAIR) 10 MG tablet Take 10 mg by mouth at bedtime.     . ondansetron (ZOFRAN) 4 MG tablet Take 1 tablet (4 mg total) by mouth every 6 (six) hours as needed for nausea or vomiting. 20 tablet 0  . oxyCODONE (OXY IR/ROXICODONE) 5 MG immediate release tablet Take 1-2 tablets (5-10 mg total) by mouth every 6 (six) hours as needed for moderate pain. 50 tablet 0  . PROAIR HFA 108 (90 BASE) MCG/ACT inhaler Inhale 2 puffs into the lungs every 4 (four) hours as needed for wheezing or shortness of breath.     . rizatriptan (MAXALT-MLT) 5 MG disintegrating tablet TAKE 1 TABLET (5 MG TOTAL) BY MOUTH AS NEEDED FOR MIGRAINE. MAY REPEAT IN 2 HOURS IF NEEDED 15 tablet 5  . SUMAtriptan (IMITREX) 6 MG/0.5ML SOLN injection Inject 6 mg into the skin every 2 (two) hours as needed for migraine or headache. May repeat in 2 hours if headache persists or recurs. Max 2 doses in 24 hours    . SUMAtriptan (IMITREX) 6 MG/0.5ML SOLN injection Inject 0.5 mLs (6 mg total) into the skin every 2 (two) hours as needed for migraine or headache. May repeat in 2 hours if headache persists or recurs. 12 vial 6  . traZODone (DESYREL) 100 MG tablet TAKE 1 TABLET (100 MG TOTAL) BY MOUTH AT BEDTIME. 30 tablet 1  . verapamil (CALAN-SR) 120 MG CR tablet TAKE 1 TABLET BY MOUTH AT BEDTIME ( MAX 30 DAY SUPPLY PER INSURANCE) (Patient taking differently: Take 120 mg by mouth at bedtime. TAKE 1 TABLET BY MOUTH AT BEDTIME ( MAX 30 DAY SUPPLY PER INSURANCE)) 30 tablet 6   No current facility-administered medications for this visit.    OBJECTIVE: Middle-aged white woman in no acute distress Filed Vitals:   04/30/15 0849  BP: 115/61  Pulse: 76  Temp: 97.6 F (36.4 C)  Resp: 18     Body mass index is 27.44 kg/(m^2).    ECOG FS:1 - Symptomatic but completely ambulatory   Sclerae unicteric, pupils round and equal Oropharynx clear  and moist-- no thrush or other lesions No  cervical or supraclavicular adenopathy Lungs no rales or rhonchi Heart regular rate and rhythm Abd soft, nontender, positive bowel sounds MSK no focal spinal tenderness, no upper extremity lymphedema Neuro: nonfocal, well oriented, appropriate affect Breasts: Deferred; she status post bilateral mastectomies   LAB RESULTS:  CMP     Component Value Date/Time   NA 143 04/30/2015 0838   NA 138 02/18/2015 0251   K 4.9 04/30/2015 0838   K 3.7 02/18/2015 0251   CL 110 02/18/2015 0251   CO2 23 04/30/2015 0838   CO2 20* 02/18/2015 0251   GLUCOSE 78 04/30/2015 0838   GLUCOSE 114* 02/18/2015 0251   BUN 13.0 04/30/2015 0838   BUN 15 02/18/2015 0251   CREATININE 1.1 04/30/2015 0838   CREATININE 1.02* 02/18/2015 0251   CALCIUM 9.2 04/30/2015 0838   CALCIUM 9.0 02/18/2015 0251   PROT 6.4 04/30/2015 0838   PROT 6.8 02/18/2015 0251   ALBUMIN 4.0 04/30/2015 0838   ALBUMIN 4.0 02/18/2015 0251   AST 12 04/30/2015 0838   AST 21 02/18/2015 0251   ALT 14 04/30/2015 0838   ALT 17 02/18/2015 0251   ALKPHOS 108 04/30/2015 0838   ALKPHOS 96 02/18/2015 0251   BILITOT 0.54 04/30/2015 0838   BILITOT 0.3 02/18/2015 0251   GFRNONAA >60 02/18/2015 0251   GFRAA >60 02/18/2015 0251    I No results found for: SPEP  Lab Results  Component Value Date   WBC 2.8* 04/30/2015   NEUTROABS 1.7 04/30/2015   HGB 11.1* 04/30/2015   HCT 32.8* 04/30/2015   MCV 88.8 04/30/2015   PLT 202 04/30/2015      Chemistry      Component Value Date/Time   NA 143 04/30/2015 0838   NA 138 02/18/2015 0251   K 4.9 04/30/2015 0838   K 3.7 02/18/2015 0251   CL 110 02/18/2015 0251   CO2 23 04/30/2015 0838   CO2 20* 02/18/2015 0251   BUN 13.0 04/30/2015 0838   BUN 15 02/18/2015 0251   CREATININE 1.1 04/30/2015 0838   CREATININE 1.02* 02/18/2015 0251      Component Value Date/Time   CALCIUM 9.2 04/30/2015 0838   CALCIUM 9.0 02/18/2015 0251   ALKPHOS 108 04/30/2015 0838   ALKPHOS 96 02/18/2015 0251   AST 12  04/30/2015 0838   AST 21 02/18/2015 0251   ALT 14 04/30/2015 0838   ALT 17 02/18/2015 0251   BILITOT 0.54 04/30/2015 0838   BILITOT 0.3 02/18/2015 0251       No results found for: LABCA2  No components found for: [id number]  No results for input(s): INR in the last 168 hours.  Urinalysis    Component Value Date/Time   COLORURINE YELLOW 02/18/2015 0510   APPEARANCEUR CLOUDY* 02/18/2015 0510   LABSPEC 1.018 02/18/2015 0510   LABSPEC 1.010 07/10/2014 1004   PHURINE 5.0 02/18/2015 0510   PHURINE 6.0 07/10/2014 1004   GLUCOSEU NEGATIVE 02/18/2015 0510   GLUCOSEU Negative 07/10/2014 1004   HGBUR SMALL* 02/18/2015 0510   HGBUR Moderate 07/10/2014 1004   BILIRUBINUR NEGATIVE 02/18/2015 0510   BILIRUBINUR Negative 07/10/2014 1004   KETONESUR NEGATIVE 02/18/2015 0510   KETONESUR Negative 07/10/2014 1004   PROTEINUR NEGATIVE 02/18/2015 0510   PROTEINUR Negative 07/10/2014 1004   UROBILINOGEN 0.2 02/18/2015 0510   UROBILINOGEN 0.2 07/10/2014 1004   NITRITE NEGATIVE 02/18/2015 0510   NITRITE Negative 07/10/2014 1004   LEUKOCYTESUR MODERATE* 02/18/2015 0510   LEUKOCYTESUR Negative 07/10/2014  1004    STUDIES: Most recent echocardiogram on 02/04/15 showed an EF of 50-55%  ASSESSMENT: 54 y.o. BRCA negative [place] woman status post left breast biopsy 02/22/2014 for a clinical T2/T3 NX, stage II or III invasive ductal carcinoma, grade 3, estrogen and progesterone receptor negative, with an MIB-1 of 83%, and HER-2 amplified with a signals a ratio of 2.07and a copy number per cell of 4.75  (1) biopsy of an additional area of enhancement in the left breast 03/08/2014 showed ductal carcinoma in situ, estrogen and progesterone receptor negative.  (2) status post bilateral mastectomies with left sentinel lymph node sampling 04/02/2014, showing:  (a) on the right, benign breast tissue including a single negative lymph node  (b) On  the left, a  pT1c pN0, stage IA invasive ductal  carcinoma, grade 3, with negative margins  (3) adjuvant chemotherapy started 05/08/2014, consisting of carboplatin and docetaxel given every 3 weeks x6, together with trastuzumab, with neulasta day 2   (a) docetaxel removed from final cycle 08/21/2014 because of persistent neuropathy symptoms  (4) trastuzumab to be continued to complete a year (last dose 04/30/2015)  (a) echo 02/04/15 showed a well-preserved ejection fraction  (5) reconstruction with bilateral silicone implants 09/81/1914 ([place])  (6) tobacco abuse: The patient quit smoking 03/30/2014  (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 I am obtaining an echocardiogram sometime next month to document her ejection fraction, but she has maintained very good heart function and I don't anticipate any problems there.  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 She underwent total abdominal hysterectomy with bilateral salpingo-oophorectomy remotely and is known to have "elects pelvic floor". She will see gynecology day after tomorrow and I have alerted her surgeon, Dr. Dalbert Batman, to see her perhaps next week for consideration of surgery to help resolve this problem. In any case she will need to have her port removed.  I am not sure why her a kidney function appears to have dropped. I think it may be useful to have a nephrology consultation and I am placing that in. In the meantime I am asking her to stop nonsteroidals and keep herself well-hydrated.  At this point she has completed her breast cancer treatment. She will have restaging PET scan mid December and see me shortly thereafter.  She knows to call for any problems that may develop before the next visit here.  [name], MD   04/30/2015 9:40 AM

## 2015-04-30 NOTE — Patient Instructions (Addendum)
[facility] Discharge Instructions for Patients Receiving Chemotherapy  Today you received the following chemotherapy agents: Herceptin.  To help prevent nausea and vomiting after your treatment, we encourage you to take your nausea medication as directed.   If you develop nausea and vomiting that is not controlled by your nausea medication, call the clinic.   BELOW ARE SYMPTOMS THAT SHOULD BE REPORTED IMMEDIATELY:  *FEVER GREATER THAN 100.5 F  *CHILLS WITH OR WITHOUT FEVER  NAUSEA AND VOMITING THAT IS NOT CONTROLLED WITH YOUR NAUSEA MEDICATION  *UNUSUAL SHORTNESS OF BREATH  *UNUSUAL BRUISING OR BLEEDING  TENDERNESS IN MOUTH AND THROAT WITH OR WITHOUT PRESENCE OF ULCERS  *URINARY PROBLEMS  *BOWEL PROBLEMS  UNUSUAL RASH Items with * indicate a potential emergency and should be followed up as soon as possible.  Feel free to call the clinic you have any questions or concerns. The clinic phone number is [phone].  Please show the Outlook at check-in to the Emergency Department and triage nurse.   PER DR. MAGRINAT-DO NOT TAKE MOBIC, IBUPROFEN, ADVIL, MOTRIN OR ALEVE.  YOU MAY TAKE TYLENOL, NEURONTIN OR OXYCODONE FOR PAIN.

## 2015-05-02 ENCOUNTER — Telehealth: Payer: Self-pay | Admitting: Oncology

## 2015-05-02 ENCOUNTER — Encounter: Payer: Self-pay | Admitting: Obstetrics & Gynecology

## 2015-05-02 ENCOUNTER — Ambulatory Visit (INDEPENDENT_AMBULATORY_CARE_PROVIDER_SITE_OTHER): Payer: BLUE CROSS/BLUE SHIELD | Admitting: Obstetrics & Gynecology

## 2015-05-02 VITALS — BP 100/70 | HR 68 | Ht 66.0 in | Wt 169.0 lb

## 2015-05-02 DIAGNOSIS — N823 Fistula of vagina to large intestine: Secondary | ICD-10-CM

## 2015-05-02 NOTE — Progress Notes (Signed)
Patient ID: Stacy Fernandez, female   DOB: [birth year], 54 y.o.   MRN: [id number] Chief Complaint  Patient presents with  . Referral    rectovaginal fistula with discarge.    Blood pressure 100/70, pulse 68, height 5\' 6"  (1.676 m), weight 169 lb (76.658 kg).  54 y.o. No obstetric history on file. No LMP recorded. Patient has had a hysterectomy. The current method of family planning is status post hysterectomy.  Subjective This is a new patient visit  Pt with sudden onset over the past couple of weeks of fluid per vagina, copious, yellowish liquid CT scan suggests a rectal vaginal communication No recent pelvic surgery No history of pelvic irradiation No rectl bleeding or other GI symptoms  Objective There is a very small erythematous, indurated area at the right lateral aspect of the vaginal cuff, no fluid or discharge present during exam Rectal no masses palpabble induration is presnt  Pertinent ROS No burning with urination, frequency or urgency No nausea, vomiting or diarrhea Nor fever chills or other constitutional symptoms   Labs or studies CT reviewed with radiologist    Impression Diagnoses this Encounter::   ICD-9-CM ICD-10-CM   1. 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 There is no associated mass  Established relevant diagnosis(es): Breast cncer  Plan/Recommendations: No orders of the defined types were placed in this encounter.    Labs or Scans Ordered: No orders of the defined types were placed in this encounter.    A follow up appointment with a rectovaginal surgeon at [facility] has been attempted, pending at this point Will probably need a colonoscopy and possible diversion procedure for repair  Follow up Return if symptoms worsen or fail to improve.      All questions were answered.  Past Medical History  Diagnosis Date  .  Hypothyroidism   . Anxiety     Due to situation, CA breast  . Wears glasses   . Wears dentures     upper  . Migraines   . COPD (chronic obstructive pulmonary disease) ([place])     denies SOB with ADLs  . Hematuria     cause unknow- saw Urologist  . History of breast cancer 03/2014    left  . History of pneumonia 09/2014  . History of chemotherapy     finished chemo 09/2014  . Neuropathic pain of both feet ([place])     due to effects of chemo  . History of gastroesophageal reflux (GERD)     no problems since Nissen fundoplication  . 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 Cancer [facility]) 02/2014    left breast    Past Surgical History  Procedure Laterality Date  . Cholecystectomy    . Appendectomy    . Laparoscopic nissen fundoplication  11/6385    for GERD- not a problem  . Video bronchoscopy Bilateral 01/16/2013    Procedure: VIDEO BRONCHOSCOPY WITHOUT FLUORO;  Surgeon: [name], MD;  Location: [facility];  Service: Cardiopulmonary;  Laterality: Bilateral;  . Mastectomy complete / simple w/ sentinel node biopsy Left 04/02/2014    axillary SLN  . Mastectomy complete / simple Right 04/02/2014    PROPHYLACTIC  . Portacath placement Right 04/02/2014  . Total abdominal hysterectomy      partial   . Simple mastectomy  with axillary sentinel node biopsy Bilateral 04/02/2014    Procedure: LEFT TOTAL MASTECTOMY WITH LEFT AXILLARY SENTINEL NODE BIOPSY, RIGHT PROPHYLACTIC MASTETCTOMY;  Surgeon: [name], MD;  Location: [place];  Service: General;  Laterality: Bilateral;  . Portacath placement N/A 04/02/2014    Procedure: INSERTION PORT-A-CATH;  Surgeon: [name], MD;  Location: [address];  Service: General;  Laterality: N/A;  . Left heart catheterization with coronary angiogram N/A 12/26/2012    Procedure: LEFT HEART CATHETERIZATION WITH CORONARY ANGIOGRAM;  Surgeon: Lorretta Harp, MD;  Location: [facility] CATH LAB;  Service:  Cardiovascular;  Laterality: N/A;  . Breast reconstruction with placement of tissue expander and flex hd (acellular hydrated dermis) Bilateral 09/25/2014    Procedure: PLACEMENT OF BILATERAL TISSUE EXPANDER AND  ACELLULAR DERMIS FOR BREAST RECONSTRUCTION ;  Surgeon: [name], MD;  Location: [place];  Service: Plastics;  Laterality: Bilateral;  . Bilateral salpingoophorectomy    . Cardiac catheterization  12/26/2012    "essentially normal coronary arteries, normal LV function"  . Nissen fundoplication  01/14/3150  . Esophagogastroduodenoscopy (egd) with propofol  01/18/2013  . Removal of bilateral tissue expanders with placement of bilateral breast implants Bilateral 12/18/2014    Procedure: REMOVAL OF BILATERAL TISSUE EXPANDERS,PLACEMENT SILICONE IMPLANTS ;  Surgeon: [name], MD;  Location: [place];  Service: Plastics;  Laterality: Bilateral;  . Liposuction with lipofilling Bilateral 12/18/2014    Procedure: LIPOFILLING TO BILATERAL CHEST;  Surgeon: [name], MD;  Location: [place];  Service: Plastics;  Laterality: Bilateral;    OB History    No data available      Allergies  Allergen Reactions  . Contrast Media [Iodinated Diagnostic Agents] Anaphylaxis  . Prednisone Shortness Of Breath and Swelling    CAN TOLERATE IF GIVEN BENADRYL PRIOR     Social History   Social History  . Marital Status: Married    Spouse Name: [name]   . Number of Children: 4  . Years of Education: GED   Occupational History  .     Social History Main Topics  . Smoking status: Former Smoker -- 0.00 packs/day for 37 years    Quit date: 03/29/2014  . Smokeless tobacco: Never Used     Comment: uses vapor occasionally  . Alcohol Use: No  . Drug Use: No  . Sexual Activity: Not Asked   Other Topics Concern  . None   Social History Narrative   Patient lives at home with her husband [name]) Patient works full time.   Education- GED    Right handed.   Caffeine- one cup of coffee daily.    Family History  Problem Relation Age of Onset  . Heart failure Mother   . Colon cancer Father 58    stomach cancer also in 38s  . Throat cancer Brother 66    smoker  . Heart attack Maternal Grandmother   . Colon cancer Paternal Grandmother 49  . Cancer Paternal Grandfather     kidney and bladder  . Throat cancer Brother 37    throat cancer, smoker  . Ovarian cancer Sister 10    ovarian cancer at 32, colorectal cancer at 53  . Breast cancer Paternal Aunt 2  . Ovarian cancer Other 42    niece with ovarian cancer

## 2015-05-02 NOTE — Telephone Encounter (Signed)
Called and left a message that [facility] Fern[facility] sent back a fax that she ranks a 3 out of 1-4 and they may call her in about 57months for an appt

## 2015-05-03 ENCOUNTER — Telehealth: Payer: Self-pay | Admitting: Obstetrics & Gynecology

## 2015-05-06 ENCOUNTER — Telehealth: Payer: Self-pay | Admitting: *Deleted

## 2015-05-06 NOTE — Telephone Encounter (Signed)
Pt states called [facility] and spoke with Lars Mage and was told they did not have a referral from Dr. Elonda Husky. Please advise.

## 2015-05-06 NOTE — Telephone Encounter (Signed)
Spoke with Conni Slipper at [facility] and they no longer take referral thru [facility] will need to fax pt demographics, insurance information, and office visit note to [phone] ATT: Conni Slipper.

## 2015-05-06 NOTE — Telephone Encounter (Signed)
Message sent to Dr. Elonda Husky this am. Pt states spoke with [facility] from [facility] and they do not have a referral from Dr. Elonda Husky.

## 2015-05-06 NOTE — Telephone Encounter (Signed)
I called them myself and could not refer until pt contacted them, she had a previous balance.  Please put a referrl in EPIC: rectovaginal fistula  Thanks

## 2015-05-15 ENCOUNTER — Ambulatory Visit (INDEPENDENT_AMBULATORY_CARE_PROVIDER_SITE_OTHER): Payer: BLUE CROSS/BLUE SHIELD

## 2015-05-15 DIAGNOSIS — I671 Cerebral aneurysm, nonruptured: Secondary | ICD-10-CM | POA: Diagnosis not present

## 2015-05-15 DIAGNOSIS — C50412 Malignant neoplasm of upper-outer quadrant of left female breast: Secondary | ICD-10-CM

## 2015-05-15 DIAGNOSIS — G43019 Migraine without aura, intractable, without status migrainosus: Secondary | ICD-10-CM

## 2015-05-16 ENCOUNTER — Other Ambulatory Visit: Payer: Self-pay | Admitting: General Surgery

## 2015-05-16 DIAGNOSIS — N823 Fistula of vagina to large intestine: Secondary | ICD-10-CM

## 2015-05-20 ENCOUNTER — Telehealth: Payer: Self-pay | Admitting: Neurology

## 2015-05-20 ENCOUNTER — Other Ambulatory Visit: Payer: Self-pay | Admitting: Oncology

## 2015-05-20 NOTE — Telephone Encounter (Signed)
Please call patient MRI a of the brain , there was a small 2 mm projection at intracranial part of left internal carotid artery, showed no change compared to previous study in March 2014,   Abnormal MRA head (without) demonstrating: 1. Small 36mm projection of the left supraclinoid internal carotid artery may represent small aneurysm vs. infundibulum.  2. Remainder of medium-large sized intracranial arteries are unremarkable. 3. Images from prior study not available at this time for comparison. Addendum will be made when images are available. Compared to prior report from 10/06/12, no significant change.

## 2015-05-20 NOTE — Telephone Encounter (Signed)
Chart reviewed.

## 2015-05-20 NOTE — Telephone Encounter (Signed)
Left message on her personal voicemail with results.  Invited her to call back with any questions.

## 2015-05-21 ENCOUNTER — Other Ambulatory Visit: Payer: Self-pay | Admitting: General Surgery

## 2015-05-21 DIAGNOSIS — N823 Fistula of vagina to large intestine: Secondary | ICD-10-CM

## 2015-05-23 ENCOUNTER — Ambulatory Visit
Admission: RE | Admit: 2015-05-23 | Discharge: 2015-05-23 | Disposition: A | Discharge status: Home / Self Care | Payer: BLUE CROSS/BLUE SHIELD | Source: Ambulatory Visit | Attending: General Surgery | Admitting: General Surgery

## 2015-05-23 DIAGNOSIS — N823 Fistula of vagina to large intestine: Secondary | ICD-10-CM

## 2015-05-24 ENCOUNTER — Other Ambulatory Visit: Payer: Self-pay | Admitting: Oncology

## 2015-05-27 ENCOUNTER — Other Ambulatory Visit: Payer: Self-pay | Admitting: Oncology

## 2015-05-31 ENCOUNTER — Other Ambulatory Visit: Payer: Self-pay | Admitting: General Surgery

## 2015-05-31 NOTE — H&P (Signed)
Stacy Fernandez 05/31/2015 4:05 PM Location: [facility] Patient #: [id number] DOB: August 07, 1960 Married / Language: [name] / Race: White Female  History of Present Illness Leighton Ruff MD; [id number] 4:29 PM) Patient words: post-op.  The patient is a 54 year old female who presents with rectovaginal fistula. 54 year old female who presents to the office as a referral from Dr. Jana Hakim and Dr Elonda Husky. Over the past several weeks she has noticed feculent discharge vaginally. A CT scan was concerning for a possible rectovaginal fistula. She was seen by her gynecologist and a small lesion was noted in the 11 o'clock position concerning for a fistula site. She reports multiple episodes of stool per vagina. She is also having some achy pain in her back that radiates to her right groin. She denies any history of radiation. She denies any history of obstetrical trauma or rectal surgery in the past. Her last colonoscopy was here recently where according to her no polyps were found. The last colonoscopy in the system that I see was in 2015 and showed 2 adenomatous polyps that were removed.   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 Gastroesophageal Reflux Disease Emphysema Of Lung Migraine Headache Lump In Breast Chronic Obstructive Lung Disease Breast Cancer  Past Surgical History Leighton Ruff, MD; [id number] 4:29 PM) Gallbladder Surgery - Open Colon Polyp Removal - Colonoscopy Knee Surgery Left. Hysterectomy (not due to cancer) - Complete Breast Mass; Local Excision Left. Breast Biopsy Bilateral. Appendectomy Mastectomy  Bilateral.  Diagnostic Studies History Leighton Ruff, MD; [id number] 4:29 PM) Mammogram within last year Colonoscopy within last year  Allergies Marjean Donna, [place]; 05/31/2015 4:06 PM) Iodinated Contrast Media  Medication History (Sonya Bynum, Kay; 05/31/2015 4:06 PM) TraMADol HCl (50MG  Tablet, 1 (one) Tablet Oral every six hours, as needed, Taken starting 05/11/2014) Active. Wellbutrin XL (300MG  Tablet ER 24HR, Oral) Active. Flonase (50MCG/ACT Suspension, Nasal) Active. Lasix (20MG  Tablet, Oral) Active. Gabapentin (300MG  Tablet, Oral) Active. Garcinia Cambogia-Chromium (500-200MG -MCG Tablet, Oral) Active. Ativan (0.5MG  Tablet, Oral) Active. Singulair (10MG  Tablet, Oral) Active. Zofran (4MG  Tablet, Oral) Active. Verapamil HCl ER (120MG  Capsule ER 24HR, Oral) Active. Desyrel (100MG  Tablet, Oral) Active. Imitrex (6MG /0.5ML Solution, Subcutaneous) Active. Lipitor (10MG  Tablet, Oral) Active. Symbicort (160-4.5MCG/ACT Aerosol, Inhalation) Active. Wellbutrin XL (150MG  Tablet ER 24HR, Oral) Active. Hycodan (5-1.5MG  Tablet, Oral) Active. Nitrostat (0.4MG  Tab Sublingual, Sublingual) Active. Percocet (7.5-325MG  Tablet, Oral) Active. ProAir HFA (108 (90 Base)MCG/ACT Aerosol Soln, Inhalation) Active. Maxalt (10MG  Tablet, Oral) Active. Zanaflex (2MG  Capsule, Oral) Active. TraZODone HCl (50MG  Tablet, Oral) Active. Verapamil HCl ER (120MG  Tablet ER, Oral) Active. DuoNeb (0.5-2.5 (3)MG/3ML Solution, Inhalation) Active. Levothyroxine Sodium (75MCG Tablet, Oral) Active. Medications Reconciled  Social History Leighton Ruff, MD; [id number] 4:29 PM) Caffeine use Coffee, Tea. No drug use No alcohol use Tobacco use Former smoker.  Family History Leighton Ruff, MD; [id number] 4:29 PM) Seizure disorder Son. Ovarian Cancer Family Members In General, Sister. Thyroid problems Daughter. Migraine Headache Mother. Arthritis Mother. Alcohol Abuse Brother,  Father, Sister. Colon Cancer Father. Breast Cancer Sister.  Pregnancy / Birth History Leighton Ruff, MD; [id number] 4:29 PM) 48 4 Age of menopause 45-55 Age at menarche 73 years. Maternal age 59-20 Grav[place] 4     Review of Systems Leighton Ruff MD;  05/31/2015 4:30 PM) General Present- Chills and Fatigue. Not Present- Appetite Loss, Fever, Night Sweats, Weight Gain and Weight Loss. Skin Not Present- Change in Wart/Mole, Dryness, Hives, Jaundice, New Lesions, Non-Healing Wounds, Rash and Ulcer. HEENT Present- Hearing Loss. Not Present- Earache, Hoarseness, Nose Bleed, Oral Ulcers, Ringing in the Ears, Seasonal Allergies, Sinus Pain, Sore Throat, Visual Disturbances, Wears glasses/contact lenses and Yellow Eyes. Respiratory Present- Chronic Cough and Wheezing. Not Present- Bloody sputum, Difficulty Breathing and Snoring. Breast Present- Breast Mass. Not Present- Breast Pain, Nipple Discharge and Skin Changes. Cardiovascular Present- Difficulty Breathing Lying Down, Leg Cramps and Swelling of Extremities. Not Present- Chest Pain, Palpitations, Rapid Heart Rate and Shortness of Breath. Gastrointestinal Present- Bloating and Chronic diarrhea. Not Present- Abdominal Pain, Bloody Stool, Change in Bowel Habits, Constipation, Difficulty Swallowing, Excessive gas, Gets full quickly at meals, Hemorrhoids, Indigestion, Nausea, Rectal Pain and Vomiting. Female Genitourinary Present- Frequency and Urgency. Not Present- Nocturia, Painful Urination and Pelvic Pain. Musculoskeletal Present- Back Pain, Muscle Weakness and Swelling of Extremities. Not Present- Joint Pain, Joint Stiffness and Muscle Pain. Neurological Present- Headaches and Weakness. Not Present- Decreased Memory, Fainting, Numbness, Seizures, Tingling, Tremor and Trouble walking. Psychiatric Present- Anxiety, Change in Sleep Pattern and Fearful. Not Present- Bipolar, Depression and Frequent crying. Endocrine Present- Cold  Intolerance. Not Present- Excessive Hunger, Hair Changes, Heat Intolerance, Hot flashes and New Diabetes. Hematology Present- Easy Bruising. Not Present- Excessive bleeding, Gland problems, HIV and Persistent Infections.  Vitals (Sonya Bynum CMA; 05/31/2015 4:06 PM) 05/31/2015 4:05 PM Weight: 164 lb Height: 66in Body Surface Area: 1.84 m Body Mass Index: 26.47 kg/m  Temp.: 20F(Temporal)  Pulse: 76 (Regular)  BP: 128/76 (Sitting, Left Arm, Standard)      Physical Exam Leighton Ruff MD; [id number] 4:30 PM)  General Mental Status-Alert. General Appearance-Not in acute distress. Build & Nutrition-Well nourished. Posture-Normal posture. Gait-Normal.  Head and Neck Head-normocephalic, atraumatic with no lesions or palpable masses. Trachea-midline.  Chest and Lung Exam Chest and lung exam reveals -on auscultation, normal breath sounds, no adventitious sounds and normal vocal resonance.  Cardiovascular Cardiovascular examination reveals -normal heart sounds, regular rate and rhythm with no murmurs.  Abdomen Inspection Inspection of the abdomen reveals - No Hernias. Palpation/Percussion Palpation and Percussion of the abdomen reveal - Soft, Non Tender, No Rigidity (guarding), No hepatosplenomegaly and No Palpable abdominal masses.  Rectal Anorectal Exam External - normal external exam. Internal - normal internal exam.  Neurologic Neurologic evaluation reveals -alert and oriented x 3 with no impairment of recent or remote memory, normal attention span and ability to concentrate, normal sensation and normal coordination.  Musculoskeletal Normal Exam - Bilateral-Upper Extremity Strength Normal and Lower Extremity Strength Normal.    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 She was noted to have feculent discharge with bowel movements within her vagina. I was unable to identify a fistula in the office and had her undergo a CT scan with rectal contrast. CT shows a low perianal fistula at the anterior position extending to the lower most aspect of the vagina. I have recommended a transanal mucosal advancement flap. She has a port that needs to be removed as well. We will plan on doing this during the same time. We discussed that surgery has approximately 20% recurrence rate.

## 2015-06-03 ENCOUNTER — Encounter: Payer: Self-pay | Admitting: Genetic Counselor

## 2015-06-03 DIAGNOSIS — Z1379 Encounter for other screening for genetic and chromosomal anomalies: Secondary | ICD-10-CM | POA: Insufficient documentation

## 2015-06-11 ENCOUNTER — Other Ambulatory Visit: Payer: Self-pay | Admitting: General Surgery

## 2015-06-14 ENCOUNTER — Ambulatory Visit: Payer: BLUE CROSS/BLUE SHIELD | Admitting: Adult Health

## 2015-06-17 ENCOUNTER — Encounter: Payer: Self-pay | Admitting: Adult Health

## 2015-06-17 ENCOUNTER — Ambulatory Visit (INDEPENDENT_AMBULATORY_CARE_PROVIDER_SITE_OTHER): Payer: BLUE CROSS/BLUE SHIELD | Admitting: Adult Health

## 2015-06-17 ENCOUNTER — Telehealth: Payer: Self-pay | Admitting: Neurology

## 2015-06-17 ENCOUNTER — Encounter: Payer: Self-pay | Admitting: *Deleted

## 2015-06-17 VITALS — BP 116/78 | HR 76 | Temp 97.4°F | Ht 66.0 in | Wt 166.0 lb

## 2015-06-17 DIAGNOSIS — C50412 Malignant neoplasm of upper-outer quadrant of left female breast: Secondary | ICD-10-CM

## 2015-06-17 DIAGNOSIS — J449 Chronic obstructive pulmonary disease, unspecified: Secondary | ICD-10-CM | POA: Diagnosis not present

## 2015-06-17 DIAGNOSIS — G43809 Other migraine, not intractable, without status migrainosus: Secondary | ICD-10-CM

## 2015-06-17 MED ORDER — DICLOFENAC POTASSIUM(MIGRAINE) 50 MG PO PACK: PACK | ORAL | Status: DC

## 2015-06-17 NOTE — Telephone Encounter (Signed)
Patient requested to speak with the nurse regarding some changes in medication she had discussed with Dr. Krista Blue. Please call and advise.

## 2015-06-17 NOTE — Telephone Encounter (Signed)
Left message for a return call

## 2015-06-17 NOTE — Patient Instructions (Signed)
Keep up good work.  Continue on current regimen .  follow up Dr. Elsworth Soho  In 6 months and As needed

## 2015-06-17 NOTE — Progress Notes (Signed)
Subjective:    Patient ID: Stacy Fernandez, female    DOB: [birth year], 54 y.o.   MRN: [id number]  HPI 54 yo former heavy smoker with COPD. Diagnosed with breast cancer 2015 , status post bilateral mastectomy and chemotherapy  TEST  12/2012, cardiac cath Showed essentially normal coronary arteries, LVEF was estimated at 60%, troponins were negative.  Of note, she has an allergy listed as anaphylaxis to contrast dye and was premedicated prior to the procedure.   cardiopulmonary stress testing in 12/13/12 showed mildly reduced functional status with O2 of 76% predicted.  Spirometry 11/2012 -moderate airway obstruction with FEV1 of 58% FVC of 71% and ratio 65, diffusion was 38%   12/2012 Admitted for chest pain, intermed prob VQ scan, CT angio neg (after premedication)-severe centrilobular and paraseptal emphysema noted, Indeterminate punctate (<3 mm) nodules within the trachea and proximal aspect of the right main stem bronchus  12/2012 bronchoscopy >> Nodular appearing white sputum noted in the right main stem which washed away with saline. Similar tag noted in the posterior wall of trachea which was brushed >> neg  02/2014 PET scan -no evidence of lymph node, chest metastases  2016 Spirometry shows ratio 65, FEV1 61%, FVC 75%  06/17/2015 Follow up : COPD  Patient returns for six-month follow-up for COPD. She is on DuoNeb as needed. Says that she is working full-time. She does not use her DuoNeb very often. PT c/o sinus drainage and dry cough.. Had recent ear infection, now improving after abx from PCP.   Denies any SOB, wheezing, chest congestion/tightness, fever, nausea or vomiting.  Hx of breast cancer in 2015 s/p chemo and bilateral mastectomy . Has upcoming PET scan in Dec.  Flu shot utd.  CT chest 01/2015 no evidence of mets, no nodules. Advanced emphysema.  She denies any hemoptysis, chest pain, orthopnea, PND or leg swelling.  Past Medical History  Diagnosis Date  .  Hypothyroidism   . Anxiety     Due to situation, CA breast  . Wears glasses   . Wears dentures     upper  . Migraines   . COPD (chronic obstructive pulmonary disease) ([place])     denies SOB with ADLs  . Hematuria     cause unknow- saw Urologist  . History of breast cancer 03/2014    left  . History of pneumonia 09/2014  . History of chemotherapy     finished chemo 09/2014  . 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 History of gastroesophageal reflux (GERD)     no problems since Nissen fundoplication  . 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 Cancer [facility]) 02/2014    left breast   Current Outpatient Prescriptions on File Prior to Visit  Medication Sig Dispense Refill  . buPROPion (WELLBUTRIN XL) 300 MG 24 hr tablet Take 300 mg by mouth daily.    . DUONEB 0.5-2.5 (3) MG/3ML SOLN Inhale 3 mLs into the lungs every 6 (six) hours as needed (for shortness of breath).   3  . fluticasone (FLONASE) 50 MCG/ACT nasal spray Place 1 spray into both nostrils daily as needed for allergies or rhinitis.    . furosemide (LASIX) 20 MG tablet Take 0.5 tablets (10 mg total) by mouth daily. 30 tablet 0  . gabapentin (NEURONTIN) 300 MG capsule TAKE 1 CAPSULE (300 MG TOTAL) BY MOUTH 4 (FOUR) TIMES  DAILY. 120 capsule 3  . Garcinia Cambogia-Chromium 500-200 MG-MCG TABS Take 1 tablet by mouth 2 (two) times daily.    Marland Kitchen KLOR-CON M20 20 MEQ tablet TAKE 1 TABLET (20 MEQ TOTAL) BY MOUTH ONCE. (Patient taking differently: TAKE 1 TABLET (20 MEQ TOTAL) BY MOUTH ONCE DAY) 30 tablet 1  . levothyroxine (SYNTHROID, LEVOTHROID) 125 MCG tablet Take 125 mcg by mouth daily before breakfast.    . LORazepam (ATIVAN) 0.5 MG tablet Take 1 tablet (0.5 mg total) by mouth at bedtime as needed (Nausea or vomiting). 30 tablet 0  . montelukast (SINGULAIR) 10 MG tablet Take 10 mg by mouth at bedtime.     Marland Kitchen oxyCODONE (OXY IR/ROXICODONE) 5 MG immediate release  tablet Take 1-2 tablets (5-10 mg total) by mouth every 6 (six) hours as needed for moderate pain. 50 tablet 0  . PROAIR HFA 108 (90 BASE) MCG/ACT inhaler Inhale 2 puffs into the lungs every 4 (four) hours as needed for wheezing or shortness of breath.     . rizatriptan (MAXALT-MLT) 5 MG disintegrating tablet TAKE 1 TABLET (5 MG TOTAL) BY MOUTH AS NEEDED FOR MIGRAINE. MAY REPEAT IN 2 HOURS IF NEEDED 15 tablet 5  . SUMAtriptan (IMITREX) 6 MG/0.5ML SOLN injection Inject 6 mg into the skin every 2 (two) hours as needed for migraine or headache. May repeat in 2 hours if headache persists or recurs. Max 2 doses in 24 hours    . SUMAtriptan (IMITREX) 6 MG/0.5ML SOLN injection Inject 0.5 mLs (6 mg total) into the skin every 2 (two) hours as needed for migraine or headache. May repeat in 2 hours if headache persists or recurs. 12 vial 6  . traZODone (DESYREL) 100 MG tablet TAKE 1 TABLET (100 MG TOTAL) BY MOUTH AT BEDTIME. 30 tablet 1  . verapamil (CALAN-SR) 120 MG CR tablet TAKE 1 TABLET BY MOUTH AT BEDTIME ( MAX 30 DAY SUPPLY PER INSURANCE) (Patient taking differently: Take 120 mg by mouth at bedtime. TAKE 1 TABLET BY MOUTH AT BEDTIME ( MAX 30 DAY SUPPLY PER INSURANCE)) 30 tablet 6  . diphenhydrAMINE (BENADRYL) 50 MG tablet Take 1 tablet (50 mg total) by mouth See admin instructions. Take one tablet (50mg ) 1 hour prior to CT Scan and with each dose of prednisone (Patient not taking: Reported on 06/17/2015) 30 tablet 0  . lidocaine-prilocaine (EMLA) cream Apply 1 application topically as needed (for chemo injections).   0  . ondansetron (ZOFRAN) 4 MG tablet Take 1 tablet (4 mg total) by mouth every 6 (six) hours as needed for nausea or vomiting. (Patient not taking: Reported on 06/17/2015) 20 tablet 0   No current facility-administered medications on file prior to visit.      Review of Systems    Constitutional:   No  weight loss, night sweats,  Fevers, chills, fatigue, or  lassitude.  HEENT:   No  headaches,  Difficulty swallowing,  Tooth/dental problems, or  Sore throat,                No sneezing, itching, ear ache,  +nasal congestion, post nasal drip,   CV:  No chest pain,  Orthopnea, PND, swelling in lower extremities, anasarca, dizziness, palpitations, syncope.   GI  No heartburn, indigestion, abdominal pain, nausea, vomiting, diarrhea, change in bowel habits, loss of appetite, bloody stools.   Resp:    No excess mucus, no productive cough,  No non-productive cough,  No coughing up of blood.  No change in color of mucus.  No wheezing.  No chest wall deformity  Skin: no rash or lesions.  GU: no dysuria, change in color of urine, no urgency or frequency.  No flank pain, no hematuria   MS:  No joint pain or swelling.  No decreased range of motion.  No back pain.  Psych:  No change in mood or affect. No depression or anxiety.  No memory loss.      Objective:   Physical Exam GEN: A/Ox3; pleasant , NAD, well nourished   HEENT:  [place]/AT,  EACs-clear, TMs-wnl, NOSE-clear, THROAT-clear, no lesions, no postnasal drip or exudate noted.   NECK:  Supple w/ fair ROM; no JVD; normal carotid impulses w/o bruits; no thyromegaly or nodules palpated; no lymphadenopathy.  RESP  Clear  P & A; w/o, wheezes/ rales/ or rhonchi.no accessory muscle use, no dullness to percussion  CARD:  RRR, no m/r/g  , no peripheral edema, pulses intact, no cyanosis or clubbing.  GI:   Soft & nt; nml bowel sounds; no organomegaly or masses detected.  Musco: Warm bil, no deformities or joint swelling noted.   Neuro: alert, no focal deficits noted.    Skin: Warm, no lesions or rashes         Assessment & Plan:

## 2015-06-17 NOTE — Assessment & Plan Note (Signed)
Cont w/ oncology follow up  follow up for PET scan next month as planned

## 2015-06-17 NOTE — Telephone Encounter (Signed)
Cambia was not sent in at last office visit - rx sent to the pharmacy.

## 2015-06-17 NOTE — Assessment & Plan Note (Signed)
Controlled on current regimen  No frequent flares or increased SABA use  Hold off on controller rx for now   Plan  Cont on current regimen

## 2015-06-18 NOTE — Progress Notes (Signed)
Reviewed & agree with plan  

## 2015-06-23 ENCOUNTER — Other Ambulatory Visit: Payer: Self-pay | Admitting: Neurology

## 2015-06-25 ENCOUNTER — Encounter (HOSPITAL_BASED_OUTPATIENT_CLINIC_OR_DEPARTMENT_OTHER): Payer: Self-pay | Admitting: *Deleted

## 2015-06-25 NOTE — Progress Notes (Signed)
NPO AFTER MN WITH EXCEPTION CLEAR LIQUIDS UNTIL 0730 (NO CREAM/ MILK PRODUCTS).  ARRIVE AT 1200.  NEEDS  ISTAT.  CURRENT CHEST CT AND EKG IN CHART AND EPIC.  WILL TAKE WELLBUTRIN AM DOS W/ SIPS OF WATER.

## 2015-06-28 ENCOUNTER — Ambulatory Visit (HOSPITAL_BASED_OUTPATIENT_CLINIC_OR_DEPARTMENT_OTHER): Payer: BLUE CROSS/BLUE SHIELD | Admitting: Anesthesiology

## 2015-06-28 ENCOUNTER — Ambulatory Visit (HOSPITAL_BASED_OUTPATIENT_CLINIC_OR_DEPARTMENT_OTHER)
Admission: RE | Admit: 2015-06-28 | Discharge: 2015-06-28 | Disposition: A | Discharge status: Home / Self Care | Payer: BLUE CROSS/BLUE SHIELD | Source: Ambulatory Visit | Attending: General Surgery | Admitting: General Surgery

## 2015-06-28 ENCOUNTER — Encounter (HOSPITAL_BASED_OUTPATIENT_CLINIC_OR_DEPARTMENT_OTHER): Payer: Self-pay

## 2015-06-28 ENCOUNTER — Encounter (HOSPITAL_BASED_OUTPATIENT_CLINIC_OR_DEPARTMENT_OTHER)
Admission: RE | Disposition: A | Discharge status: Home / Self Care | Payer: Self-pay | Source: Ambulatory Visit | Attending: General Surgery

## 2015-06-28 DIAGNOSIS — Z87891 Personal history of nicotine dependence: Secondary | ICD-10-CM | POA: Diagnosis not present

## 2015-06-28 DIAGNOSIS — Z452 Encounter for adjustment and management of vascular access device: Secondary | ICD-10-CM | POA: Diagnosis not present

## 2015-06-28 DIAGNOSIS — Z79899 Other long term (current) drug therapy: Secondary | ICD-10-CM | POA: Insufficient documentation

## 2015-06-28 DIAGNOSIS — N823 Fistula of vagina to large intestine: Secondary | ICD-10-CM | POA: Insufficient documentation

## 2015-06-28 DIAGNOSIS — Z7951 Long term (current) use of inhaled steroids: Secondary | ICD-10-CM | POA: Diagnosis not present

## 2015-06-28 DIAGNOSIS — I739 Peripheral vascular disease, unspecified: Secondary | ICD-10-CM | POA: Diagnosis not present

## 2015-06-28 DIAGNOSIS — G43909 Migraine, unspecified, not intractable, without status migrainosus: Secondary | ICD-10-CM | POA: Diagnosis not present

## 2015-06-28 DIAGNOSIS — G709 Myoneural disorder, unspecified: Secondary | ICD-10-CM | POA: Diagnosis not present

## 2015-06-28 DIAGNOSIS — I2510 Atherosclerotic heart disease of native coronary artery without angina pectoris: Secondary | ICD-10-CM | POA: Diagnosis not present

## 2015-06-28 DIAGNOSIS — Z79891 Long term (current) use of opiate analgesic: Secondary | ICD-10-CM | POA: Diagnosis not present

## 2015-06-28 DIAGNOSIS — J439 Emphysema, unspecified: Secondary | ICD-10-CM | POA: Diagnosis not present

## 2015-06-28 DIAGNOSIS — D649 Anemia, unspecified: Secondary | ICD-10-CM | POA: Diagnosis not present

## 2015-06-28 DIAGNOSIS — K219 Gastro-esophageal reflux disease without esophagitis: Secondary | ICD-10-CM | POA: Insufficient documentation

## 2015-06-28 DIAGNOSIS — Z853 Personal history of malignant neoplasm of breast: Secondary | ICD-10-CM | POA: Insufficient documentation

## 2015-06-28 DIAGNOSIS — E039 Hypothyroidism, unspecified: Secondary | ICD-10-CM | POA: Insufficient documentation

## 2015-06-28 HISTORY — DX: Chronic kidney disease, stage 3 (moderate): N18.3

## 2015-06-28 HISTORY — DX: Emphysema, unspecified: J43.9

## 2015-06-28 HISTORY — PX: PORT-A-CATH REMOVAL: SHX5289

## 2015-06-28 HISTORY — DX: Adverse effect of antineoplastic and immunosuppressive drugs, initial encounter: T45.1X5A

## 2015-06-28 HISTORY — DX: Chronic kidney disease, stage 3 unspecified: N18.30

## 2015-06-28 HISTORY — DX: Malignant neoplasm of unspecified site of unspecified female breast: C50.919

## 2015-06-28 HISTORY — DX: Adverse effect of antineoplastic and immunosuppressive drugs, initial encounter: R23.9

## 2015-06-28 HISTORY — DX: Changes in skin texture: R23.4

## 2015-06-28 HISTORY — DX: Reserved for concepts with insufficient information to code with codable children: IMO0002

## 2015-06-28 HISTORY — DX: Fistula of vagina to large intestine: N82.3

## 2015-06-28 HISTORY — DX: Drug-induced polyneuropathy: G62.0

## 2015-06-28 HISTORY — DX: Chronic migraine without aura, not intractable, without status migrainosus: G43.709

## 2015-06-28 LAB — POCT I-STAT 4, (NA,K, GLUC, HGB,HCT)
GLUCOSE: 89 mg/dL (ref 65–99)
HCT: 31 % — ABNORMAL LOW (ref 36.0–46.0)
Hemoglobin: 10.5 g/dL — ABNORMAL LOW (ref 12.0–15.0)
POTASSIUM: 3.9 mmol/L (ref 3.5–5.1)
SODIUM: 140 mmol/L (ref 135–145)

## 2015-06-28 SURGERY — REMOVAL PORT-A-CATH
Anesthesia: General | Site: Rectum | Laterality: Right

## 2015-06-28 MED ORDER — OXYCODONE HCL 5 MG PO TABS
5.0000 mg | ORAL_TABLET | Freq: Once | ORAL | Status: DC | PRN
  Filled 2015-06-28: qty 1

## 2015-06-28 MED ORDER — HYDROMORPHONE HCL 1 MG/ML IJ SOLN
INTRAMUSCULAR | Status: AC
  Filled 2015-06-28: qty 1

## 2015-06-28 MED ORDER — HYDROGEN PEROXIDE 1.5 % MT SOLN
OROMUCOSAL | Status: DC | PRN
  Administered 2015-06-28: 100 mL via TOPICAL

## 2015-06-28 MED ORDER — LIDOCAINE HCL 4 % MT SOLN
OROMUCOSAL | Status: DC | PRN
  Administered 2015-06-28: 2 mL via TOPICAL

## 2015-06-28 MED ORDER — FENTANYL CITRATE (PF) 100 MCG/2ML IJ SOLN
INTRAMUSCULAR | Status: AC
  Filled 2015-06-28: qty 2

## 2015-06-28 MED ORDER — SODIUM CHLORIDE 0.9 % IV SOLN
INTRAVENOUS | Status: DC
  Administered 2015-06-28: 13:00:00 via INTRAVENOUS
  Filled 2015-06-28: qty 1000

## 2015-06-28 MED ORDER — DIBUCAINE 1 % RE OINT
TOPICAL_OINTMENT | RECTAL | Status: AC
  Filled 2015-06-28: qty 28

## 2015-06-28 MED ORDER — SODIUM CHLORIDE 0.9 % IV SOLN
250.0000 mL | INTRAVENOUS | Status: DC | PRN
  Filled 2015-06-28: qty 250

## 2015-06-28 MED ORDER — BUPIVACAINE HCL (PF) 0.25 % IJ SOLN
INTRAMUSCULAR | Status: AC
  Filled 2015-06-28: qty 30

## 2015-06-28 MED ORDER — PROMETHAZINE HCL 25 MG/ML IJ SOLN
6.2500 mg | INTRAMUSCULAR | Status: DC | PRN
  Filled 2015-06-28: qty 1

## 2015-06-28 MED ORDER — LIDOCAINE HCL (CARDIAC) 20 MG/ML IV SOLN
INTRAVENOUS | Status: DC | PRN
  Administered 2015-06-28: 50 mg via INTRAVENOUS

## 2015-06-28 MED ORDER — PROPOFOL 10 MG/ML IV BOLUS
INTRAVENOUS | Status: AC
  Filled 2015-06-28: qty 20

## 2015-06-28 MED ORDER — SODIUM CHLORIDE 0.9 % IJ SOLN
3.0000 mL | Freq: Two times a day (BID) | INTRAMUSCULAR | Status: DC
  Filled 2015-06-28: qty 3

## 2015-06-28 MED ORDER — MIDAZOLAM HCL 5 MG/5ML IJ SOLN
INTRAMUSCULAR | Status: DC | PRN
  Administered 2015-06-28: 2 mg via INTRAVENOUS

## 2015-06-28 MED ORDER — FUROSEMIDE 20 MG PO TABS: 10.0000 mg | ORAL_TABLET | Freq: Every morning | ORAL | Status: DC

## 2015-06-28 MED ORDER — PROPOFOL 10 MG/ML IV BOLUS
INTRAVENOUS | Status: DC | PRN
  Administered 2015-06-28: 200 mg via INTRAVENOUS
  Administered 2015-06-28: 50 mg via INTRAVENOUS

## 2015-06-28 MED ORDER — POTASSIUM CHLORIDE CRYS ER 20 MEQ PO TBCR: EXTENDED_RELEASE_TABLET | ORAL | Status: DC

## 2015-06-28 MED ORDER — SUCCINYLCHOLINE CHLORIDE 20 MG/ML IJ SOLN
INTRAMUSCULAR | Status: DC | PRN
Start: 2015-06-28 — End: 2015-06-28
  Administered 2015-06-28: 100 mg via INTRAVENOUS

## 2015-06-28 MED ORDER — ACETIC ACID 5 % SOLN
Status: AC
  Filled 2015-06-28: qty 500

## 2015-06-28 MED ORDER — GABAPENTIN 300 MG PO CAPS: ORAL_CAPSULE | ORAL | Status: DC

## 2015-06-28 MED ORDER — ACETAMINOPHEN 325 MG PO TABS
650.0000 mg | ORAL_TABLET | ORAL | Status: DC | PRN
  Filled 2015-06-28: qty 2

## 2015-06-28 MED ORDER — HYDROMORPHONE HCL 1 MG/ML IJ SOLN
0.2500 mg | INTRAMUSCULAR | Status: DC | PRN
  Administered 2015-06-28 (×4): 0.25 mg via INTRAVENOUS
  Filled 2015-06-28: qty 1

## 2015-06-28 MED ORDER — ACETAMINOPHEN 650 MG RE SUPP
650.0000 mg | RECTAL | Status: DC | PRN
  Filled 2015-06-28: qty 1

## 2015-06-28 MED ORDER — LIDOCAINE 5 % EX OINT
TOPICAL_OINTMENT | CUTANEOUS | Status: AC
  Filled 2015-06-28: qty 35.44

## 2015-06-28 MED ORDER — MEPERIDINE HCL 25 MG/ML IJ SOLN
6.2500 mg | INTRAMUSCULAR | Status: DC | PRN
  Filled 2015-06-28: qty 1

## 2015-06-28 MED ORDER — OXYCODONE HCL 5 MG/5ML PO SOLN
5.0000 mg | Freq: Once | ORAL | Status: DC | PRN
  Filled 2015-06-28: qty 5

## 2015-06-28 MED ORDER — MIDAZOLAM HCL 2 MG/2ML IJ SOLN
INTRAMUSCULAR | Status: AC
  Filled 2015-06-28: qty 2

## 2015-06-28 MED ORDER — OXYCODONE HCL 5 MG PO TABS
5.0000 mg | ORAL_TABLET | ORAL | Status: DC | PRN
  Filled 2015-06-28: qty 2

## 2015-06-28 MED ORDER — BUPIVACAINE-EPINEPHRINE 0.5% -1:200000 IJ SOLN
INTRAMUSCULAR | Status: DC | PRN
  Administered 2015-06-28: 28 mL

## 2015-06-28 MED ORDER — SODIUM CHLORIDE 0.9 % IJ SOLN
3.0000 mL | INTRAMUSCULAR | Status: DC | PRN
  Filled 2015-06-28: qty 3

## 2015-06-28 MED ORDER — VERAPAMIL HCL ER 120 MG PO TBCR: EXTENDED_RELEASE_TABLET | ORAL | Status: DC

## 2015-06-28 MED ORDER — SODIUM CHLORIDE 0.9 % IV SOLN
1.0000 g | INTRAVENOUS | Status: AC
  Administered 2015-06-28: 1 g via INTRAVENOUS
  Filled 2015-06-28 (×2): qty 1

## 2015-06-28 MED ORDER — TRAMADOL HCL 50 MG PO TABS
50.0000 mg | ORAL_TABLET | Freq: Once | ORAL | Status: AC
  Administered 2015-06-28: 50 mg via ORAL
  Filled 2015-06-28: qty 1

## 2015-06-28 MED ORDER — LIDOCAINE HCL (CARDIAC) 20 MG/ML IV SOLN
INTRAVENOUS | Status: AC
  Filled 2015-06-28: qty 10

## 2015-06-28 MED ORDER — TRAMADOL HCL 50 MG PO TABS
ORAL_TABLET | ORAL | Status: AC
  Filled 2015-06-28: qty 1

## 2015-06-28 MED ORDER — ONDANSETRON HCL 4 MG/2ML IJ SOLN
INTRAMUSCULAR | Status: DC | PRN
Start: 2015-06-28 — End: 2015-06-28
  Administered 2015-06-28: 4 mg via INTRAVENOUS

## 2015-06-28 MED ORDER — BUPIVACAINE-EPINEPHRINE (PF) 0.5% -1:200000 IJ SOLN
INTRAMUSCULAR | Status: AC
  Filled 2015-06-28: qty 30

## 2015-06-28 MED ORDER — FENTANYL CITRATE (PF) 100 MCG/2ML IJ SOLN
INTRAMUSCULAR | Status: DC | PRN
  Administered 2015-06-28 (×2): 50 ug via INTRAVENOUS

## 2015-06-28 MED ORDER — TRAMADOL HCL 50 MG PO TABS: 50.0000 mg | ORAL_TABLET | Freq: Four times a day (QID) | ORAL | Status: DC | PRN

## 2015-06-28 SURGICAL SUPPLY — 88 items
BLADE CLIPPER SURG (BLADE) IMPLANT
BLADE HEX COATED 2.75 (ELECTRODE) ×4 IMPLANT
BLADE SURG 15 STRL LF DISP TIS (BLADE) ×3 IMPLANT
BLADE SURG 15 STRL SS (BLADE) ×4
BRIEF STRETCH FOR OB PAD LRG (UNDERPADS AND DIAPERS) ×6 IMPLANT
CANISTER SUCT 1200ML W/VALVE (MISCELLANEOUS) IMPLANT
CANISTER SUCTION 2500CC (MISCELLANEOUS) ×4 IMPLANT
CANNULA VESSEL W/WING WO/VALVE (CANNULA) ×2 IMPLANT
CHLORAPREP W/TINT 26ML (MISCELLANEOUS) ×4 IMPLANT
CLOTH BEACON ORANGE TIMEOUT ST (SAFETY) ×4 IMPLANT
COVER BACK TABLE 60X90IN (DRAPES) ×4 IMPLANT
COVER MAYO STAND STRL (DRAPES) ×4 IMPLANT
DECANTER SPIKE VIAL GLASS SM (MISCELLANEOUS) ×4 IMPLANT
DRAPE LAPAROTOMY T 102X78X121 (DRAPES) ×2 IMPLANT
DRAPE LG THREE QUARTER DISP (DRAPES) ×4 IMPLANT
DRAPE PED LAPAROTOMY (DRAPES) ×4 IMPLANT
DRAPE UNDERBUTTOCKS STRL (DRAPE) IMPLANT
DRAPE UTILITY XL STRL (DRAPES) ×8 IMPLANT
ELECT BLADE TIP CTD 4 INCH (ELECTRODE) ×2 IMPLANT
ELECT REM PT RETURN 9FT ADLT (ELECTROSURGICAL) ×4
ELECTRODE REM PT RTRN 9FT ADLT (ELECTROSURGICAL) ×3 IMPLANT
GAUZE SPONGE 4X4 12PLY STRL (GAUZE/BANDAGES/DRESSINGS) IMPLANT
GAUZE SPONGE 4X4 16PLY XRAY LF (GAUZE/BANDAGES/DRESSINGS) ×4 IMPLANT
GAUZE VASELINE 3X9 (GAUZE/BANDAGES/DRESSINGS) IMPLANT
GLOVE BIO SURGEON STRL SZ 6.5 (GLOVE) ×10 IMPLANT
GLOVE BIOGEL PI IND STRL 6.5 (GLOVE) ×1 IMPLANT
GLOVE BIOGEL PI IND STRL 7.0 (GLOVE) ×3 IMPLANT
GLOVE BIOGEL PI IND STRL 7.5 (GLOVE) ×2 IMPLANT
GLOVE BIOGEL PI INDICATOR 6.5 (GLOVE) ×1
GLOVE BIOGEL PI INDICATOR 7.0 (GLOVE) ×1
GLOVE BIOGEL PI INDICATOR 7.5 (GLOVE) ×2
GOWN BRE IMP PREV XXLGXLNG (GOWN DISPOSABLE) ×6 IMPLANT
GOWN STRL REUS W/ TWL LRG LVL3 (GOWN DISPOSABLE) ×2 IMPLANT
GOWN STRL REUS W/ TWL XL LVL3 (GOWN DISPOSABLE) ×5 IMPLANT
GOWN STRL REUS W/TWL 2XL LVL3 (GOWN DISPOSABLE) ×2 IMPLANT
GOWN STRL REUS W/TWL LRG LVL3 (GOWN DISPOSABLE)
GOWN STRL REUS W/TWL XL LVL3 (GOWN DISPOSABLE) ×4
HYDROGEN PEROXIDE 16OZ (MISCELLANEOUS) IMPLANT
IV CATH 14GX2 1/4 (CATHETERS) IMPLANT
KIT ROOM TURNOVER WOR (KITS) ×2 IMPLANT
LEGGING LITHOTOMY PAIR STRL (DRAPES) ×4 IMPLANT
LIQUID BAND (GAUZE/BANDAGES/DRESSINGS) ×4 IMPLANT
LOOP VESSEL MAXI BLUE (MISCELLANEOUS) IMPLANT
LUBRICANT JELLY K Y 4OZ (MISCELLANEOUS) ×4 IMPLANT
MANIFOLD NEPTUNE II (INSTRUMENTS) IMPLANT
NDL HYPO 25X1 1.5 SAFETY (NEEDLE) ×2 IMPLANT
NDL SAFETY ECLIPSE 18X1.5 (NEEDLE) ×1 IMPLANT
NEEDLE HYPO 18GX1.5 SHARP (NEEDLE) ×4
NEEDLE HYPO 25X1 1.5 SAFETY (NEEDLE) ×4 IMPLANT
NS IRRIG 1000ML POUR BTL (IV SOLUTION) ×2 IMPLANT
NS IRRIG 500ML POUR BTL (IV SOLUTION) ×4 IMPLANT
PACK BASIN DAY SURGERY FS (CUSTOM PROCEDURE TRAY) ×4 IMPLANT
PAD ABD 8X10 STRL (GAUZE/BANDAGES/DRESSINGS) ×2 IMPLANT
PAD ARMBOARD 7.5X6 YLW CONV (MISCELLANEOUS) ×4 IMPLANT
PENCIL BUTTON HOLSTER BLD 10FT (ELECTRODE) ×4 IMPLANT
SLEEVE SCD COMPRESS KNEE MED (MISCELLANEOUS) ×2 IMPLANT
SPONGE GAUZE 4X4 12PLY (GAUZE/BANDAGES/DRESSINGS) IMPLANT
SPONGE GAUZE 4X4 12PLY STER LF (GAUZE/BANDAGES/DRESSINGS) ×4 IMPLANT
SPONGE LAP 4X18 X RAY DECT (DISPOSABLE) ×2 IMPLANT
SPONGE SURGIFOAM ABS GEL 100 (HEMOSTASIS) IMPLANT
SPONGE SURGIFOAM ABS GEL 12-7 (HEMOSTASIS) IMPLANT
SUCTION FRAZIER TIP 10 FR DISP (SUCTIONS) ×4 IMPLANT
SUT CHROMIC 2 0 SH (SUTURE) ×2 IMPLANT
SUT CHROMIC 3 0 SH 27 (SUTURE) IMPLANT
SUT ETHIBOND 0 (SUTURE) IMPLANT
SUT GUT CHROMIC 3 0 (SUTURE) ×4 IMPLANT
SUT MNCRL AB 4-0 PS2 18 (SUTURE) IMPLANT
SUT MON AB 3-0 SH 27 (SUTURE)
SUT MON AB 3-0 SH27 (SUTURE) IMPLANT
SUT VIC AB 2-0 SH 18 (SUTURE) IMPLANT
SUT VIC AB 2-0 SH 27 (SUTURE) ×4
SUT VIC AB 2-0 SH 27X BRD (SUTURE) ×2 IMPLANT
SUT VIC AB 2-0 SH 27XBRD (SUTURE) ×1 IMPLANT
SUT VIC AB 3-0 SH 27 (SUTURE)
SUT VIC AB 3-0 SH 27X BRD (SUTURE) IMPLANT
SUT VIC AB 4-0 P-3 18XBRD (SUTURE) IMPLANT
SUT VIC AB 4-0 P3 18 (SUTURE)
SUT VIC AB 4-0 PS2 18 (SUTURE) ×2 IMPLANT
SUT VICRYL 3-0 CR8 SH (SUTURE) IMPLANT
SUT VICRYL 4-0 PS2 18IN ABS (SUTURE) IMPLANT
SYR 20CC LL (SYRINGE) ×2 IMPLANT
SYR CONTROL 10ML LL (SYRINGE) ×4 IMPLANT
SYRINGE 12CC LL (MISCELLANEOUS) ×2 IMPLANT
TOWEL OR 17X24 6PK STRL BLUE (TOWEL DISPOSABLE) ×8 IMPLANT
TOWEL OR 17X26 10 PK STRL BLUE (TOWEL DISPOSABLE) ×2 IMPLANT
TRAY DSU PREP LF (CUSTOM PROCEDURE TRAY) ×4 IMPLANT
TUBE CONNECTING 12X1/4 (SUCTIONS) ×4 IMPLANT
YANKAUER SUCT BULB TIP NO VENT (SUCTIONS) ×4 IMPLANT

## 2015-06-28 NOTE — Anesthesia Postprocedure Evaluation (Signed)
Anesthesia Post Note  Patient: Stacy Fernandez  Procedure(s) Performed: Procedure(s) (LRB): REMOVAL PORT-A-CATH (Right)  ANAL EXAM UNDER ANESTHESIA (N/A)  Patient location during evaluation: PACU Anesthesia Type: General Level of consciousness: awake and alert Pain management: pain level controlled Vital Signs Assessment: post-procedure vital signs reviewed and stable Respiratory status: spontaneous breathing, nonlabored ventilation, respiratory function stable and patient connected to nasal cannula oxygen Cardiovascular status: blood pressure returned to baseline and stable Postop Assessment: no signs of nausea or vomiting Anesthetic complications: no    Last Vitals:  Filed Vitals:   06/28/15 1527 06/28/15 1530  BP:    Pulse: 85 82  Temp:    Resp: 21 21    Last Pain:  Filed Vitals:   06/28/15 1531  PainSc: 6                  Issaiah Seabrooks L

## 2015-06-28 NOTE — H&P (View-Only) (Signed)
Stacy Fernandez 05/31/2015 4:05 PM Location: [facility] Patient #: [id number] DOB: 25-Jul-1960 Married / Language: Stacy Fernandez / Race: White Female  History of Present Illness Leighton Ruff MD; [id number] 4:29 PM) Patient words: post-op.  The patient is a 54 year old female who presents with rectovaginal fistula. 54 year old female who presents to the office as a referral from Dr. Jana Hakim and Dr Elonda Husky. Over the past several weeks she has noticed feculent discharge vaginally. A CT scan was concerning for a possible rectovaginal fistula. She was seen by her gynecologist and a small lesion was noted in the 11 o'clock position concerning for a fistula site. She reports multiple episodes of stool per vagina. She is also having some achy pain in her back that radiates to her right groin. She denies any history of radiation. She denies any history of obstetrical trauma or rectal surgery in the past. Her last colonoscopy was here recently where according to her no polyps were found. The last colonoscopy in the system that I see was in 2015 and showed 2 adenomatous polyps that were removed.   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 Gastroesophageal Reflux Disease Emphysema Of Lung Migraine Headache Lump In Breast Chronic Obstructive Lung Disease Breast Cancer  Past Surgical History Leighton Ruff, MD; [id number] 4:29 PM) Gallbladder Surgery - Open Colon Polyp Removal - Colonoscopy Knee Surgery Left. Hysterectomy (not due to cancer) - Complete Breast Mass; Local Excision Left. Breast Biopsy Bilateral. Appendectomy Mastectomy  Bilateral.  Diagnostic Studies History Leighton Ruff, MD; [id number] 4:29 PM) Mammogram within last year Colonoscopy within last year  Allergies Marjean Donna, [place]; 05/31/2015 4:06 PM) Iodinated Contrast Media  Medication History (Sonya Bynum, [place]; 05/31/2015 4:06 PM) TraMADol HCl (50MG  Tablet, 1 (one) Tablet Oral every six hours, as needed, Taken starting 05/11/2014) Active. Wellbutrin XL (300MG  Tablet ER 24HR, Oral) Active. Flonase (50MCG/ACT Suspension, Nasal) Active. Lasix (20MG  Tablet, Oral) Active. Gabapentin (300MG  Tablet, Oral) Active. Garcinia Cambogia-Chromium (500-200MG -MCG Tablet, Oral) Active. Ativan (0.5MG  Tablet, Oral) Active. Singulair (10MG  Tablet, Oral) Active. Zofran (4MG  Tablet, Oral) Active. Verapamil HCl ER (120MG  Capsule ER 24HR, Oral) Active. Desyrel (100MG  Tablet, Oral) Active. Imitrex (6MG /0.5ML Solution, Subcutaneous) Active. Lipitor (10MG  Tablet, Oral) Active. Symbicort (160-4.5MCG/ACT Aerosol, Inhalation) Active. Wellbutrin XL (150MG  Tablet ER 24HR, Oral) Active. Hycodan (5-1.5MG  Tablet, Oral) Active. Nitrostat (0.4MG  Tab Sublingual, Sublingual) Active. Percocet (7.5-325MG  Tablet, Oral) Active. ProAir HFA (108 (90 Base)MCG/ACT Aerosol Soln, Inhalation) Active. Maxalt (10MG  Tablet, Oral) Active. Zanaflex (2MG  Capsule, Oral) Active. TraZODone HCl (50MG  Tablet, Oral) Active. Verapamil HCl ER (120MG  Tablet ER, Oral) Active. DuoNeb (0.5-2.5 (3)MG/3ML Solution, Inhalation) Active. Levothyroxine Sodium (75MCG Tablet, Oral) Active. Medications Reconciled  Social History Leighton Ruff, MD; [id number] 4:29 PM) Caffeine use Coffee, Tea. No drug use No alcohol use Tobacco use Former smoker.  Family History Leighton Ruff, MD; [id number] 4:29 PM) Seizure disorder Son. Ovarian Cancer Family Members In General, Sister. Thyroid problems Daughter. Migraine Headache Mother. Arthritis Mother. Alcohol Abuse Brother,  Father, Sister. Colon Cancer Father. Breast Cancer Sister.  Pregnancy / Birth History Leighton Ruff, MD; [id number] 4:29 PM) [age] Age of menopause 38-55 Age at menarche 44 years. Maternal age 77-20 Gravida 4     Review of Systems Leighton Ruff MD;  05/31/2015 4:30 PM) General Present- Chills and Fatigue. Not Present- Appetite Loss, Fever, Night Sweats, Weight Gain and Weight Loss. Skin Not Present- Change in Wart/Mole, Dryness, Hives, Jaundice, New Lesions, Non-Healing Wounds, Rash and Ulcer. HEENT Present- Hearing Loss. Not Present- Earache, Hoarseness, Nose Bleed, Oral Ulcers, Ringing in the Ears, Seasonal Allergies, Sinus Pain, Sore Throat, Visual Disturbances, Wears glasses/contact lenses and Yellow Eyes. Respiratory Present- Chronic Cough and Wheezing. Not Present- Bloody sputum, Difficulty Breathing and Snoring. Breast Present- Breast Mass. Not Present- Breast Pain, Nipple Discharge and Skin Changes. Cardiovascular Present- Difficulty Breathing Lying Down, Leg Cramps and Swelling of Extremities. Not Present- Chest Pain, Palpitations, Rapid Heart Rate and Shortness of Breath. Gastrointestinal Present- Bloating and Chronic diarrhea. Not Present- Abdominal Pain, Bloody Stool, Change in Bowel Habits, Constipation, Difficulty Swallowing, Excessive gas, Gets full quickly at meals, Hemorrhoids, Indigestion, Nausea, Rectal Pain and Vomiting. Female Genitourinary Present- Frequency and Urgency. Not Present- Nocturia, Painful Urination and Pelvic Pain. Musculoskeletal Present- Back Pain, Muscle Weakness and Swelling of Extremities. Not Present- Joint Pain, Joint Stiffness and Muscle Pain. Neurological Present- Headaches and Weakness. Not Present- Decreased Memory, Fainting, Numbness, Seizures, Tingling, Tremor and Trouble walking. Psychiatric Present- Anxiety, Change in Sleep Pattern and Fearful. Not Present- Bipolar, Depression and Frequent crying. Endocrine Present- Cold  Intolerance. Not Present- Excessive Hunger, Hair Changes, Heat Intolerance, Hot flashes and New Diabetes. Hematology Present- Easy Bruising. Not Present- Excessive bleeding, Gland problems, HIV and Persistent Infections.  Vitals (Sonya Bynum CMA; 05/31/2015 4:06 PM) 05/31/2015 4:05 PM Weight: 164 lb Height: 66in Body Surface Area: 1.84 m Body Mass Index: 26.47 kg/m  Temp.: 9F(Temporal)  Pulse: 76 (Regular)  BP: 128/76 (Sitting, Left Arm, Standard)      Physical Exam Leighton Ruff MD; [id number] 4:30 PM)  General Mental Status-Alert. General Appearance-Not in acute distress. Build & Nutrition-Well nourished. Posture-Normal posture. Gait-Normal.  Head and Neck Head-normocephalic, atraumatic with no lesions or palpable masses. Trachea-midline.  Chest and Lung Exam Chest and lung exam reveals -on auscultation, normal breath sounds, no adventitious sounds and normal vocal resonance.  Cardiovascular Cardiovascular examination reveals -normal heart sounds, regular rate and rhythm with no murmurs.  Abdomen Inspection Inspection of the abdomen reveals - No Hernias. Palpation/Percussion Palpation and Percussion of the abdomen reveal - Soft, Non Tender, No Rigidity (guarding), No hepatosplenomegaly and No Palpable abdominal masses.  Rectal Anorectal Exam External - normal external exam. Internal - normal internal exam.  Neurologic Neurologic evaluation reveals -alert and oriented x 3 with no impairment of recent or remote memory, normal attention span and ability to concentrate, normal sensation and normal coordination.  Musculoskeletal Normal Exam - Bilateral-Upper Extremity Strength Normal and Lower Extremity Strength Normal.    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 She was noted to have feculent discharge with bowel movements within her vagina. I was unable to identify a fistula in the office and had her undergo a CT scan with rectal contrast. CT shows a low perianal fistula at the anterior position extending to the lower most aspect of the vagina. I have recommended a transanal mucosal advancement flap. She has a port that needs to be removed as well. We will plan on doing this during the same time. We discussed that surgery has approximately 20% recurrence rate.

## 2015-06-28 NOTE — Anesthesia Preprocedure Evaluation (Signed)
Anesthesia Evaluation  Patient identified by MRN, date of birth, ID band Patient awake    Reviewed: Allergy & Precautions, NPO status , Patient's Chart, lab work & pertinent test results  History of Anesthesia Complications Negative for: history of anesthetic complications  Airway Mallampati: II  TM Distance: >3 FB Neck ROM: Full    Dental  (+) Dental Advisory Given, Edentulous Upper   Pulmonary pneumonia, COPD, former smoker,    Pulmonary exam normal        Cardiovascular + angina + CAD and + Peripheral Vascular Disease  Normal cardiovascular exam     Neuro/Psych  Headaches, Anxiety  Neuromuscular disease    GI/Hepatic Neg liver ROS, GERD  ,  Endo/Other  Hypothyroidism   Renal/GU negative Renal ROS     Musculoskeletal   Abdominal   Peds  Hematology  (+) anemia ,   Anesthesia Other Findings   Reproductive/Obstetrics                             Anesthesia Physical  Anesthesia Plan  ASA: III  Anesthesia Plan: General   Post-op Pain Management:    Induction: Intravenous  Airway Management Planned: Mask  Additional Equipment:   Intra-op Plan:   Post-operative Plan:   Informed Consent: I have reviewed the patients History and Physical, chart, labs and discussed the procedure including the risks, benefits and alternatives for the proposed anesthesia with the patient or authorized representative who has indicated his/her understanding and acceptance.   Dental advisory given  Plan Discussed with: CRNA, Anesthesiologist and Surgeon  Anesthesia Plan Comments:         Anesthesia Quick Evaluation

## 2015-06-28 NOTE — Op Note (Signed)
06/28/2015  2:16 PM  PATIENT:  Stacy Fernandez  54 y.o. 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  INDICATION: 54 y.o. F with rectovaginal fistula and need for port removal.     OR FINDINGS: normal appearing port a cath,  DESCRIPTION: the patient was identified in the preoperative holding area and taken to the OR where they were laid on the operating room gurney.  General anesthesia was induced without difficulty. We started with her port removal.  The patient's chest was prepped and draped in the usual sterile fashion.  A surgical time out was performed.  I then mad an incision through the previous scar in the right chest.  I carried this down through the subcutaneous tissue using electrocautery.  I opened the pocket and cut and removed the 2 prolene sutures.  The port was easily freed.  A 2-0 Vicryl suture was used to close the catheter entry site.  The port was removed and the suture was tied.  A 2-0 vicryl was used to close the port cavity and the dermal layer.  A 4-0 Vicryl suture was used to close  the skin.  Dermabond was applied.   The patient was then positioned in prone jackknife position with buttocks gently taped apart.  The patient was then prepped and draped in usual sterile fashion.  SCDs were noted to be in place prior to the initiation of anesthesia. A surgical timeout was performed indicating the correct patient, procedure, positioning and need for preoperative antibiotics.  A rectal block was performed using Marcaine with epinephrine.    I began with a digital rectal exam.  There were no masses.  I then placed a Hill-Ferguson anoscope into the anal canal and evaluated this completely.  The patient had decreased resting tone but otherwise was normal.  I evaluated the entire canal.  No fistulous tracts could be seen or palpated.  There was a small external nodule in the L anterior perianal area but no external opening was found.  I evaluated the vaginal vault.  There were no masses or areas of erythema.  I used a fistula probe to probe the dentate line and no fistula site could be found.  I filled the rectum with dilute hydrogen peroxide and saw no leakage vaginally.  Lastly, I filled the rectum with dilute methylene blue and had anesthesia perform valsalva.  No leakage was noted.  At this time, I concluded that there was no fistula to be found.  The rectum was irrigated with normal saline copiously and the procedure was completed.  All counts were correct.  The patient was sent to the PACU in stable condition.

## 2015-06-28 NOTE — Discharge Instructions (Addendum)
GENERAL SURGERY: POST OP INSTRUCTIONS ° °1. DIET: Follow a light bland diet the first 24 hours after arrival home, such as soup, liquids, crackers, etc.  Be sure to include lots of fluids daily.  Avoid fast food or heavy meals as your are more likely to get nauseated.   °2. Take your usually prescribed home medications unless otherwise directed. °3. PAIN CONTROL: °a. Pain is best controlled by a usual combination of three different methods TOGETHER: °i. Ice/Heat °ii. Over the counter pain medication °iii. Prescription pain medication °b. Most patients will experience some swelling and bruising around the incisions.  Ice packs or heating pads (30-60 minutes up to 6 times a day) will help. Use ice for the first few days to help decrease swelling and bruising, then switch to heat to help relax tight/sore spots and speed recovery.  Some people prefer to use ice alone, heat alone, alternating between ice & heat.  Experiment to what works for you.  Swelling and bruising can take several weeks to resolve.   °c. It is helpful to take an over-the-counter pain medication regularly for the first few weeks.  Choose one of the following that works best for you: °i. Naproxen (Aleve, etc)  Two 220mg tabs twice a day °ii. Ibuprofen (Advil, etc) Three 200mg tabs four times a day (every meal & bedtime) °d. A  prescription for pain medication (such as Percocet, oxycodone, hydrocodone, etc) should be given to you upon discharge.  Take your pain medication as prescribed.  °i. If you are having problems/concerns with the prescription medicine (does not control pain, nausea, vomiting, rash, itching, etc), please call us [phone] to see if we need to switch you to a different pain medicine that will work better for you and/or control your side effect better. °ii. If you need a refill on your pain medication, please contact your pharmacy.  They will contact our office to request authorization. Prescriptions will not be filled after 5  pm or on week-ends. °4. Avoid getting constipated.  Between the surgery and the pain medications, it is common to experience some constipation.  Increasing fluid intake and taking a fiber supplement (such as Metamucil, Citrucel, FiberCon, MiraLax, etc) 1-2 times a day regularly will usually help prevent this problem from occurring.  A mild laxative (prune juice, Milk of Magnesia, MiraLax, etc) should be taken according to package directions if there are no bowel movements after 48 hours.   °5. Wash / shower every day.  You may shower over the dressings as they are waterproof.  Continue to shower over incision(s) after the dressing is off. °6. Remove your waterproof bandages 5 days after surgery.  You may leave the incision open to air.  You may have skin tapes (Steri Strips) covering the incision(s).  Leave them on until one week, then remove.  You may replace a dressing/Band-Aid to cover the incision for comfort if you wish.  ° ° ° ° °7. ACTIVITIES as tolerated:   °a. You may resume regular (light) daily activities beginning the next day--such as daily self-care, walking, climbing stairs--gradually increasing activities as tolerated.  If you can walk 30 minutes without difficulty, it is safe to try more intense activity such as jogging, treadmill, bicycling, low-impact aerobics, swimming, etc. °b. Save the most intensive and strenuous activity for last such as sit-ups, heavy lifting, contact sports, etc  Refrain from any heavy lifting or straining until you are off narcotics for pain control.   °c. DO NOT PUSH   THROUGH PAIN.  Let pain be your guide: If it hurts to do something, don't do it.  Pain is your body warning you to avoid that activity for another week until the pain goes down. d. You may drive when you are no longer taking prescription pain medication, you can comfortably wear a seatbelt, and you can safely maneuver your car and apply brakes. e. Dennis Bast may have sexual intercourse when it is comfortable.   8. FOLLOW UP in our office a. Please call [facility] at [phone] to set up an appointment to see your surgeon in the office for a follow-up appointment approximately 2-3 weeks after your surgery. b. Make sure that you call for this appointment the day you arrive home to insure a convenient appointment time. 9. IF YOU HAVE DISABILITY OR FAMILY LEAVE FORMS, BRING THEM TO THE OFFICE FOR PROCESSING.  DO NOT GIVE THEM TO YOUR DOCTOR.   WHEN TO CALL us [phone]: 1. Poor pain control 2. Reactions / problems with new medications (rash/itching, nausea, etc)  3. Fever over 101.5 F (38.5 C) 4. Worsening swelling or bruising 5. Continued bleeding from incision. 6. Increased pain, redness, or drainage from the incision   The clinic staff is available to answer your questions during regular business hours (8:30am-5pm).  Please dont hesitate to call and ask to speak to one of our nurses for clinical concerns.   If you have a medical emergency, go to the nearest emergency room or call 911.  A surgeon from [facility] is always on call at the [facility], [place], [place], [place], [place]  [address] ? MAIN: [phone] ? TOLL FREE: [phone] ?  FAX [phone] [email or web address]    GETTING TO GOOD BOWEL HEALTH. Irregular bowel habits such as diarrhea and multiple loose stools throughout the day can lead to many problems over time.  Having one soft, formed bowel movement a day is the most important way to prevent further problems.  The anorectal canal is designed to handle stretching and feces to safely manage our ability to get rid of solid waste (feces, poop, stool) out of our body.  BUT, diarrhea can be a burning fire to this very sensitive area of our body, causing inflamed hemorrhoids, anal fissures, increasing risk is perirectal abscesses, abdominal pain and bloating.      The goal: ONE SOFT BOWEL MOVEMENT A DAY!   To have soft, regular bowel movements:   Drink at least 8 tall glasses of water a day.    Take plenty of fiber.  Fiber is marketed as a cure for constipation, but in certain forms, it can help with loose stools as well.  Fiber is the undigested part of plant food that passes into the colon, acting as natures broom to encourage bowel motility and movement.  Fiber can absorb and hold large amounts of water. This results in a larger, bulkier stool, which is soft and easier to pass.   Work gradually over several weeks up to 6 servings a day of fiber (25g a day even more if needed) in the form of: o Vegetables -- Root (potatoes, carrots, turnips), leafy green (lettuce, salad greens, celery, spinach), or cooked high residue (cabbage, broccoli, etc) o Fruit -- Fresh (unpeeled skin & pulp), Dried (prunes, apricots, cherries, etc ),  or stewed ( applesauce)  o Whole grain breads, pasta, etc (whole wheat)  o Bran cereals   Bulking Agents -- This type  of water-retaining fiber generally is easily tolerated and can help be a consistent source of fiber for irregular bowel habits.  Fibers that are recommended for this are:  o Methylcellulose --This is a fiber derived from wood which also retains water. It is available as Citrucel.  o FiberCon (Polycarbiphil) is another good source of fiber for this that can help bulk your stools.     Post Anesthesia Home Care Instructions  Activity: Get plenty of rest for the remainder of the day. A responsible adult should stay with you for 24 hours following the procedure.  For the next 24 hours, DO NOT: -Drive a car -Paediatric nurse -Drink alcoholic beverages -Take any medication unless instructed by your physician -Make any legal decisions or sign important papers.  Meals: Start with liquid foods such as gelatin or soup. Progress to regular foods as tolerated. Avoid greasy, spicy, heavy foods. If nausea and/or vomiting occur, drink only clear liquids until  the nausea and/or vomiting subsides. Call your physician if vomiting continues.  Special Instructions/Symptoms: Your throat may feel dry or sore from the anesthesia or the breathing tube placed in your throat during surgery. If this causes discomfort, gargle with warm salt water. The discomfort should disappear within 24 hours.  If you had a scopolamine patch placed behind your ear for the management of post- operative nausea and/or vomiting:  1. The medication in the patch is effective for 72 hours, after which it should be removed.  Wrap patch in a tissue and discard in the trash. Wash hands thoroughly with soap and water. 2. You may remove the patch earlier than 72 hours if you experience unpleasant side effects which may include dry mouth, dizziness or visual disturbances. 3. Avoid touching the patch. Wash your hands with soap and water after contact with the patch.

## 2015-06-28 NOTE — Anesthesia Procedure Notes (Signed)
Procedure Name: Intubation Date/Time: 06/28/2015 1:49 PM Performed by: Mechele Claude Pre-anesthesia Checklist: Patient identified, Emergency Drugs available, Suction available and Patient being monitored Patient Re-evaluated:Patient Re-evaluated prior to inductionOxygen Delivery Method: Circle System Utilized Preoxygenation: Pre-oxygenation with 100% oxygen Intubation Type: IV induction Ventilation: Mask ventilation without difficulty Laryngoscope Size: Mac and 3 Grade View: Grade I Tube type: Oral Tube size: 7.0 mm Number of attempts: 1 Airway Equipment and Method: Stylet and LTA kit utilized Placement Confirmation: ETT inserted through vocal cords under direct vision,  positive ETCO2 and breath sounds checked- equal and bilateral Secured at: 20 cm Tube secured with: Tape Dental Injury: Teeth and Oropharynx as per pre-operative assessment

## 2015-06-28 NOTE — Transfer of Care (Signed)
   Last Vitals:  Filed Vitals:   06/28/15 1222  BP: 123/71  Pulse: 89  Temp: 36.9 C  Resp: 16    Immediate Anesthesia Transfer of Care Note  Patient: Stacy Fernandez  Procedure(s) Performed: Procedure(s) (LRB): REMOVAL PORT-A-CATH (Right)  ANAL EXAM UNDER ANESTHESIA (N/A)  Patient Location: PACU  Anesthesia Type: General  Level of Consciousness: awake, alert  and oriented  Airway & Oxygen Therapy: Patient Spontanous Breathing and Patient connected to face mask oxygen  Post-op Assessment: Report given to PACU RN and Post -op Vital signs reviewed and stable  Post vital signs: Reviewed and stable  Complications: No apparent anesthesia complications

## 2015-06-28 NOTE — Interval H&P Note (Signed)
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 After consideration of risks, benefits and other options for treatment, the patient has consented to  Procedure(s): REMOVAL PORT-A-CATH (N/A) REPAIR RECTO-VAGINAL FISTULA (N/A) POSSIBLE MUCOSAL ADVANCEMENT FLAP (N/A) POSSIBLE FISTULOTOMY (N/A) as a surgical intervention .  The patient's history has been reviewed, patient examined, no change in status, stable for surgery.  I have reviewed the patient's chart and labs.  Questions were answered to the patient's satisfaction.     Rosario Adie, MD  [facility]

## 2015-07-01 ENCOUNTER — Other Ambulatory Visit: Payer: Self-pay

## 2015-07-01 ENCOUNTER — Encounter (HOSPITAL_BASED_OUTPATIENT_CLINIC_OR_DEPARTMENT_OTHER): Payer: Self-pay | Admitting: General Surgery

## 2015-07-01 ENCOUNTER — Telehealth: Payer: Self-pay | Admitting: Neurology

## 2015-07-01 DIAGNOSIS — C50412 Malignant neoplasm of upper-outer quadrant of left female breast: Secondary | ICD-10-CM

## 2015-07-01 NOTE — Telephone Encounter (Signed)
Called to Verify Botox with Patient and she wanted me to relay about her Cambia. Patient is still waiting on approval for cambia she does not have RX yet. I relayed to patient that Janett Billow our pharmacy Tech was out of the office until 07/03/2015. I relayed I would send her Message to Sharyn Lull but her request might have to wait for Janett Billow. Patient understood and was fine with this.

## 2015-07-02 ENCOUNTER — Other Ambulatory Visit (HOSPITAL_BASED_OUTPATIENT_CLINIC_OR_DEPARTMENT_OTHER): Payer: BLUE CROSS/BLUE SHIELD

## 2015-07-02 ENCOUNTER — Ambulatory Visit (HOSPITAL_COMMUNITY)
Admission: RE | Admit: 2015-07-02 | Discharge: 2015-07-02 | Disposition: A | Discharge status: Home / Self Care | Payer: BLUE CROSS/BLUE SHIELD | Source: Ambulatory Visit | Attending: Oncology | Admitting: Oncology

## 2015-07-02 ENCOUNTER — Telehealth: Payer: Self-pay

## 2015-07-02 DIAGNOSIS — C50412 Malignant neoplasm of upper-outer quadrant of left female breast: Secondary | ICD-10-CM | POA: Diagnosis not present

## 2015-07-02 DIAGNOSIS — J432 Centrilobular emphysema: Secondary | ICD-10-CM | POA: Insufficient documentation

## 2015-07-02 DIAGNOSIS — I2510 Atherosclerotic heart disease of native coronary artery without angina pectoris: Secondary | ICD-10-CM | POA: Diagnosis not present

## 2015-07-02 DIAGNOSIS — C50812 Malignant neoplasm of overlapping sites of left female breast: Secondary | ICD-10-CM | POA: Diagnosis not present

## 2015-07-02 DIAGNOSIS — N183 Chronic kidney disease, stage 3 (moderate): Secondary | ICD-10-CM | POA: Diagnosis not present

## 2015-07-02 DIAGNOSIS — I700 Atherosclerosis of aorta: Secondary | ICD-10-CM | POA: Insufficient documentation

## 2015-07-02 DIAGNOSIS — M25559 Pain in unspecified hip: Secondary | ICD-10-CM

## 2015-07-02 LAB — CBC WITH DIFFERENTIAL/PLATELET
BASO%: 0.3 % (ref 0.0–2.0)
BASOS ABS: 0 10*3/uL (ref 0.0–0.1)
EOS ABS: 0.1 10*3/uL (ref 0.0–0.5)
EOS%: 1.3 % (ref 0.0–7.0)
HEMATOCRIT: 32.3 % — AB (ref 34.8–46.6)
HEMOGLOBIN: 11.2 g/dL — AB (ref 11.6–15.9)
LYMPH#: 1 10*3/uL (ref 0.9–3.3)
LYMPH%: 25.7 % (ref 14.0–49.7)
MCH: 31.4 pg (ref 25.1–34.0)
MCHC: 34.7 g/dL (ref 31.5–36.0)
MCV: 90.5 fL (ref 79.5–101.0)
MONO#: 0.3 10*3/uL (ref 0.1–0.9)
MONO%: 8 % (ref 0.0–14.0)
NEUT#: 2.4 10*3/uL (ref 1.5–6.5)
NEUT%: 64.7 % (ref 38.4–76.8)
Platelets: 184 10*3/uL (ref 145–400)
RBC: 3.57 10*6/uL — ABNORMAL LOW (ref 3.70–5.45)
RDW: 13.8 % (ref 11.2–14.5)
WBC: 3.8 10*3/uL — ABNORMAL LOW (ref 3.9–10.3)

## 2015-07-02 LAB — COMPREHENSIVE METABOLIC PANEL
ALBUMIN: 3.9 g/dL (ref 3.5–5.0)
ALK PHOS: 118 U/L (ref 40–150)
ALT: 14 U/L (ref 0–55)
AST: 14 U/L (ref 5–34)
Anion Gap: 7 mEq/L (ref 3–11)
BILIRUBIN TOTAL: 0.49 mg/dL (ref 0.20–1.20)
BUN: 12.4 mg/dL (ref 7.0–26.0)
CALCIUM: 9.1 mg/dL (ref 8.4–10.4)
CO2: 24 mEq/L (ref 22–29)
CREATININE: 1 mg/dL (ref 0.6–1.1)
Chloride: 110 mEq/L — ABNORMAL HIGH (ref 98–109)
EGFR: 64 mL/min/{1.73_m2} — ABNORMAL LOW (ref >90)
Glucose: 92 mg/dl (ref 70–140)
POTASSIUM: 4.2 meq/L (ref 3.5–5.1)
Sodium: 141 mEq/L (ref 136–145)
Total Protein: 6.8 g/dL (ref 6.4–8.3)

## 2015-07-02 LAB — GLUCOSE, CAPILLARY: GLUCOSE-CAPILLARY: 89 mg/dL (ref 65–99)

## 2015-07-02 MED ORDER — FLUDEOXYGLUCOSE F - 18 (FDG) INJECTION
7.9300 | Freq: Once | INTRAVENOUS | Status: AC | PRN
  Administered 2015-07-02: 7.93 via INTRAVENOUS

## 2015-07-02 NOTE — Telephone Encounter (Signed)
Writer called patient per Dr. Jana Hakim to let her know that her PET scan came back negative for any findings.  Patient stated understanding.

## 2015-07-02 NOTE — Telephone Encounter (Signed)
Patient requested to speak with Sharyn Lull RN regarding her rx for cambia.

## 2015-07-03 NOTE — Telephone Encounter (Signed)
I apologize for the delay.  I have been out of the office.  I contacted ins ([employer]) to follow up.  They verified this medication was already approved effective 06/03/2015-06/19/2016 Ref # [id number].  They tried to enter an override in the system, and ask that the pharmacy reprocess claim.  If it still rejects, the pharmacy will need to call the pharmacy help desk for further instruction.   I called the pharmacy. Spoke with pharmacist.  He said when they tried to process Rx, the computer kept locking up (communication error), so they will have to enter it again as a new Rx.  Says if they have any further issues with Rx, they will let us know.

## 2015-07-03 NOTE — Telephone Encounter (Signed)
I called [facility] today [phone]) - they attempted to process her prescription for Cambia - they are still getting a message that it requires a prior authorization.

## 2015-07-08 ENCOUNTER — Encounter: Payer: Self-pay | Admitting: Obstetrics & Gynecology

## 2015-07-08 ENCOUNTER — Ambulatory Visit (INDEPENDENT_AMBULATORY_CARE_PROVIDER_SITE_OTHER): Payer: BLUE CROSS/BLUE SHIELD | Admitting: Obstetrics & Gynecology

## 2015-07-08 VITALS — BP 120/70 | HR 76 | Wt 166.4 lb

## 2015-07-08 DIAGNOSIS — N823 Fistula of vagina to large intestine: Secondary | ICD-10-CM | POA: Diagnosis not present

## 2015-07-08 MED ORDER — CIPROFLOXACIN HCL 500 MG PO TABS: 500.0000 mg | ORAL_TABLET | Freq: Two times a day (BID) | ORAL | Status: DC

## 2015-07-08 MED ORDER — METRONIDAZOLE 500 MG PO TABS: 500.0000 mg | ORAL_TABLET | Freq: Two times a day (BID) | ORAL | Status: DC

## 2015-07-08 NOTE — Progress Notes (Signed)
Patient ID: Stacy Fernandez, female   DOB: September 22, 1960, 54 y.o.   MRN: [id number] Preoperative History and Physical  Stacy Fernandez is a 54 y.o. No obstetric history on file. with No LMP recorded. Patient has had a hysterectomy. admitted for a . a repair of a spontaneous rectovaginal fistula. i referred patient to  Dr Marcello Moores was unable to demonstrate the fistula but did state there was no anorectal abscess or other infectious process  i have reviewed the CT extensively and the rectal communication is just on the cuff side of the rectal sphincter  My plan is to essentially create a 3rd degree episiotomy and explore the space isolating and removing any indurated tissue in the hopes of isolating it on the rectal side. I will resect the involved tissue close the defect and imbricate healthy tissue in multiple layers to provide healthy tissue "space" between the dfect and the vagina  I have explained this extensively to the patient and her husband and they understand    PMH:    Past Medical History  Diagnosis Date  . Hypothyroidism   . Wears glasses   . Wears dentures     upper  . History of chemotherapy     left breast ca-- 05-09-2015   to 04-30-2015  . History of gastroesophageal reflux (GERD)     no problems since Nissen fundoplication  . Chemotherapy-induced peripheral neuropathy (HCC)     FEET  . Rectovaginal fistula   . Anxiety   . Supraclinoid carotid artery aneurysm, small [facility]) neurologist-  dr willis/  dr Krista Blue    left internal - 2 mm;  aneurysm  vs.  infundibulum--  residual chronic migraine  . Chronic migraine     caused by brain aneurysm--  BOTOX TX'S  by dr Krista Blue  . COPD with emphysema [facility]) pulmologist--  dr Elsworth Soho    advanded emphysema per CT   . Skin changes related to chemotherapy     ARMS  . 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 CKD (chronic kidney disease), stage III     PSH:     Past Surgical History  Procedure Laterality Date  . Laparoscopic nissen fundoplication  [id number]  . Video bronchoscopy Bilateral 01/16/2013    Procedure: VIDEO BRONCHOSCOPY WITHOUT FLUORO;  Surgeon: [name], MD;  Location: [facility];  Service: Cardiopulmonary;  Laterality: Bilateral;  . Simple mastectomy with axillary sentinel node biopsy Bilateral 04/02/2014    Procedure: LEFT TOTAL MASTECTOMY WITH LEFT AXILLARY SENTINEL NODE BIOPSY, RIGHT PROPHYLACTIC MASTETCTOMY;  Surgeon: [name], MD;  Location: [place];  Service: General;  Laterality: Bilateral;  . Portacath placement N/A 04/02/2014    Procedure: INSERTION PORT-A-CATH;  Surgeon: [name], MD;  Location: [place];  Service: General;  Laterality: N/A;  . Left heart catheterization with coronary angiogram N/A 12/26/2012    Procedure: LEFT HEART CATHETERIZATION WITH CORONARY ANGIOGRAM;  Surgeon: Lorretta Harp, MD;  Location: [facility] CATH LAB;  Service: Cardiovascular;  Laterality: N/A;   Nonobstructive CAD/  40-50% ostial D1/  normal LVF, ef 60%  . Breast reconstruction with placement of tissue expander and flex hd (acellular hydrated dermis) Bilateral 09/25/2014    Procedure: PLACEMENT OF BILATERAL TISSUE EXPANDER AND  ACELLULAR DERMIS FOR BREAST RECONSTRUCTION ;  Surgeon: [name], MD;  Location: [place];  Service: Plastics;  Laterality: Bilateral;  . Esophagogastroduodenoscopy (egd) with propofol  01/18/2013  . Removal of bilateral tissue expanders with placement of bilateral breast implants Bilateral 12/18/2014    Procedure: REMOVAL OF BILATERAL TISSUE EXPANDERS,PLACEMENT SILICONE IMPLANTS ;  Surgeon: [name], MD;  Location: [place];  Service: Plastics;  Laterality: Bilateral;  . Liposuction with lipofilling Bilateral 12/18/2014    Procedure: LIPOFILLING  TO BILATERAL CHEST;  Surgeon: [name], MD;  Location: [place];  Service: Plastics;  Laterality: Bilateral;  . Transthoracic echocardiogram  02-04-2015    grade 1 diastolic dysfunction, ef 42-35%/  trivial TR  . Appendectomy  1992  . Cholecystectomy  1999  . Total abdominal hysterectomy w/ bilateral salpingoophorectomy  1996  . Port-a-cath removal Right 06/28/2015    Procedure: REMOVAL PORT-A-CATH;  Surgeon: Leighton Ruff, MD;  Location: [facility];  Service: General;  Laterality: Right;    POb/GynH:      OB History    No data available      SH:   Social History  Substance Use Topics  . Smoking status: Former Smoker -- 0.00 packs/day for 37 years    Types: Cigarettes, E-cigarettes    Quit date: 03/29/2014  . Smokeless tobacco: Never Used     Comment: uses vapor occasionally  . Alcohol Use: No    FH:    Family History  Problem Relation Age of Onset  . Heart failure Mother   . Colon cancer Father 109    stomach cancer also in 48s  . Throat cancer Brother 40    smoker  . Heart attack Maternal Grandmother   . Colon cancer Paternal Grandmother 62  . Cancer Paternal Grandfather     kidney and bladder  . Throat cancer Brother 37    throat cancer, smoker  . Ovarian cancer Sister 73    ovarian cancer at 37, colorectal cancer at 50  . Breast cancer Paternal Aunt 29  . Ovarian cancer Other 40    niece with ovarian cancer     Allergies:  Allergies  Allergen Reactions  . Contrast Media [Iodinated Diagnostic Agents] Anaphylaxis  . Prednisone Shortness Of Breath and Swelling    CAN TOLERATE IF GIVEN BENADRYL PRIOR     Medications:       Current outpatient prescriptions:  .  buPROPion (WELLBUTRIN XL) 300 MG 24 hr tablet, Take 300 mg by mouth every morning. Reported on 07/08/2015, Disp: , Rfl:  .  fluticasone (FLONASE) 50 MCG/ACT nasal spray, Place 1 spray into both nostrils daily as needed for allergies or rhinitis., Disp: , Rfl:  .   furosemide (LASIX) 20 MG tablet, Take 0.5 tablets (10 mg total) by mouth every morning., Disp: 30 tablet, Rfl: 0 .  gabapentin (NEURONTIN) 300 MG capsule, TAKE 1 CAPSULE (300 MG TOTAL) BY MOUTH 4 (FOUR) TIMES DAILY.---  CURRENTLY PT TAKES ONLY IN PM, Disp: 120 capsule, Rfl: 3 .  levothyroxine (SYNTHROID, LEVOTHROID) 125 MCG tablet, Take 125 mcg by mouth daily before breakfast., Disp: , Rfl:  .  LORazepam (ATIVAN) 0.5 MG tablet, Take 1 tablet (0.5 mg total) by mouth at bedtime as needed (Nausea or vomiting)., Disp: 30 tablet, Rfl: 0 .  montelukast (SINGULAIR) 10 MG tablet, Take 10 mg by mouth at bedtime. , Disp: , Rfl:  .  potassium chloride SA (KLOR-CON M20) 20 MEQ tablet, TAKE 1 TABLET (20 MEQ TOTAL) BY MOUTH ONCE  DAY--- TAKES IN AM, Disp: 30 tablet, Rfl: 1 .  rizatriptan (MAXALT-MLT) 10 MG disintegrating tablet, Take 10 mg by mouth as needed for migraine. May repeat in 2 hours if needed, Disp: , Rfl:  .  SUMAtriptan (IMITREX) 6 MG/0.5ML SOLN injection, Inject 0.5 mLs (6 mg total) into the skin every 2 (two) hours as needed for migraine or headache. May repeat in 2 hours if headache persists or recurs., Disp: 12 vial, Rfl: 6 .  traMADol (ULTRAM) 50 MG tablet, Take 1-2 tablets (50-100 mg total) by mouth every 6 (six) hours as needed., Disp: 20 tablet, Rfl: 0 .  traZODone (DESYREL) 100 MG tablet, TAKE 1 TABLET (100 MG TOTAL) BY MOUTH AT BEDTIME., Disp: 30 tablet, Rfl: 1 .  verapamil (CALAN-SR) 120 MG CR tablet, TAKE 1 TABLET BY MOUTH AT BEDTIME ( MAX 30 DAY SUPPLY PER INSURANCE)  --- migraine prevention, Disp: 30 tablet, Rfl: 6 .  Diclofenac Potassium (CAMBIA) 50 MG PACK, Take at onset of  migraine.  May repeat in 2 hours, if needed.  Max: 2 packs in 24 hours., Disp: 9 each, Rfl: 5 .  DUONEB 0.5-2.5 (3) MG/3ML SOLN, Inhale 3 mLs into the lungs every 6 (six) hours as needed (for shortness of breath). Reported on 07/08/2015, Disp: , Rfl: 3 .  Garcinia Cambogia-Chromium 500-200 MG-MCG TABS, Take 1 tablet  by mouth 2 (two) times daily. Reported on 07/08/2015, Disp: , Rfl:  .  PROAIR HFA 108 (90 BASE) MCG/ACT inhaler, Inhale 2 puffs into the lungs every 4 (four) hours as needed for wheezing or shortness of breath. Reported on 07/08/2015, Disp: , Rfl:   Review of Systems:   Review of Systems  Constitutional: Negative for fever, chills, weight loss, malaise/fatigue and diaphoresis.  HENT: Negative for hearing loss, ear pain, nosebleeds, congestion, sore throat, neck pain, tinnitus and ear discharge.   Eyes: Negative for blurred vision, double vision, photophobia, pain, discharge and redness.  Respiratory: Negative for cough, hemoptysis, sputum production, shortness of breath, wheezing and stridor.   Cardiovascular: Negative for chest pain, palpitations, orthopnea, claudication, leg swelling and PND.  Gastrointestinal: Positive for abdominal pain. Negative for heartburn, nausea, vomiting, diarrhea, constipation, blood in stool and melena.  Genitourinary: Negative for dysuria, urgency, frequency, hematuria and flank pain.  Musculoskeletal: Negative for myalgias, back pain, joint pain and falls.  Skin: Negative for itching and rash.  Neurological: Negative for dizziness, tingling, tremors, sensory change, speech change, focal weakness, seizures, loss of consciousness, weakness and headaches.  Endo/Heme/Allergies: Negative for environmental allergies and polydipsia. Does not bruise/bleed easily.  Psychiatric/Behavioral: Negative for depression, suicidal ideas, hallucinations, memory loss and substance abuse. The patient is not nervous/anxious and does not have insomnia.      PHYSICAL EXAM:  Blood pressure 120/70, pulse 76, weight 166 lb 6.4 oz (75.479 kg).    Vitals reviewed. Constitutional: She is oriented to person, place, and time. She appears well-developed and well-nourished.  HENT:  Head: Normocephalic and atraumatic.  Right Ear: External ear normal.  Left Ear: External ear normal.   Nose: Nose normal.  Mouth/Throat: Oropharynx is clear and moist.  Eyes: Conjunctivae and EOM are normal. Pupils are equal, round, and reactive to light. Right eye exhibits no discharge. Left eye exhibits no discharge. No scleral icterus.  Neck: Normal range of motion. Neck supple. No tracheal deviation present. No thyromegaly present.  Cardiovascular: Normal rate, regular rhythm, normal heart sounds and intact distal pulses.  Exam reveals no gallop and no friction rub.   No  murmur heard. Respiratory: Effort normal and breath sounds normal. No respiratory distress. She has no wheezes. She has no rales. She exhibits no tenderness.  GI: Soft. Bowel sounds are normal. She exhibits no distension and no mass. There is tenderness. There is no rebound and no guarding.  Genitourinary:       Vulva is normal without lesions Vagina is pink moist without discharge Cervix absent Uterus is uterus absent Adnexa is negative with normal sized ovaries by sonogram  Musculoskeletal: Normal range of motion. She exhibits no edema and no tenderness.  Neurological: She is alert and oriented to person, place, and time. She has normal reflexes. She displays normal reflexes. No cranial nerve deficit. She exhibits normal muscle tone. Coordination normal.  Skin: Skin is warm and dry. No rash noted. No erythema. No pallor.  Psychiatric: She has a normal mood and affect. Her behavior is normal. Judgment and thought content normal.    Labs: Results for orders placed or performed in visit on 07/02/15 (from the past 336 hour(s))  CBC with Differential   Collection Time: 07/02/15  8:43 AM  Result Value Ref Range   WBC 3.8 (L) 3.9 - 10.3 10e3/uL   NEUT# 2.4 1.5 - 6.5 10e3/uL   HGB 11.2 (L) 11.6 - 15.9 g/dL   HCT 32.3 (L) 34.8 - 46.6 %   Platelets 184 145 - 400 10e3/uL   MCV 90.5 79.5 - 101.0 fL   MCH 31.4 25.1 - 34.0 pg   MCHC 34.7 31.5 - 36.0 g/dL   RBC 3.57 (L) 3.70 - 5.45 10e6/uL   RDW 13.8 11.2 - 14.5 %   lymph#  1.0 0.9 - 3.3 10e3/uL   MONO# 0.3 0.1 - 0.9 10e3/uL   Eosinophils Absolute 0.1 0.0 - 0.5 10e3/uL   Basophils Absolute 0.0 0.0 - 0.1 10e3/uL   NEUT% 64.7 38.4 - 76.8 %   LYMPH% 25.7 14.0 - 49.7 %   MONO% 8.0 0.0 - 14.0 %   EOS% 1.3 0.0 - 7.0 %   BASO% 0.3 0.0 - 2.0 %  Comprehensive metabolic panel   Collection Time: 07/02/15  8:43 AM  Result Value Ref Range   Sodium 141 136 - 145 mEq/L   Potassium 4.2 3.5 - 5.1 mEq/L   Chloride 110 (H) 98 - 109 mEq/L   CO2 24 22 - 29 mEq/L   Glucose 92 70 - 140 mg/dl   BUN 12.4 7.0 - 26.0 mg/dL   Creatinine 1.0 0.6 - 1.1 mg/dL   Total Bilirubin 0.49 0.20 - 1.20 mg/dL   Alkaline Phosphatase 118 40 - 150 U/L   AST 14 5 - 34 U/L   ALT 14 0 - 55 U/L   Total Protein 6.8 6.4 - 8.3 g/dL   Albumin 3.9 3.5 - 5.0 g/dL   Calcium 9.1 8.4 - 10.4 mg/dL   Anion Gap 7 3 - 11 mEq/L   EGFR 64 (L) >90 ml/min/1.73 m2  Results for orders placed or performed during the hospital encounter of 07/02/15 (from the past 336 hour(s))  Glucose, capillary   Collection Time: 07/02/15  7:02 AM  Result Value Ref Range   Glucose-Capillary 89 65 - 99 mg/dL  Results for orders placed or performed during the hospital encounter of 06/28/15 (from the past 336 hour(s))  I-STAT 4, (NA,K, GLUC, HGB,HCT)   Collection Time: 06/28/15  1:19 PM  Result Value Ref Range   Sodium 140 135 - 145 mmol/L   Potassium 3.9 3.5 - 5.1 mmol/L  Glucose, Bld 89 65 - 99 mg/dL   HCT 31.0 (L) 36.0 - 46.0 %   Hemoglobin 10.5 (L) 12.0 - 15.0 g/dL    EKG: Orders placed or performed during the hospital encounter of 02/18/15  . ED EKG within 10 minutes  . ED EKG within 10 minutes  . EKG 12-Lead  . EKG 12-Lead  . EKG    Imaging Studies: Nm Pet Image Restag (ps) Skull Base To Thigh  07/02/2015  CLINICAL DATA:  Subsequent treatment strategy for breast cancer. EXAM: NUCLEAR MEDICINE PET SKULL BASE TO THIGH TECHNIQUE: 7.9 mCi F-18 FDG was injected intravenously. Full-ring PET imaging was performed from  the skull base to thigh after the radiotracer. CT data was obtained and used for attenuation correction and anatomic localization. FASTING BLOOD GLUCOSE:  Value: 89 mg/dl COMPARISON:  03/12/2014 FINDINGS: NECK No hypermetabolic lymph nodes in the neck. CHEST No hypermetabolic mediastinal or hilar nodes. No suspicious pulmonary nodules on the CT scan. Subcutaneous fat stranding and increased uptake within the ventral chest wall on the right measures 1.8 cm and has an SUV max equal to 5.27. This may be related to previous Port-A-Cath cavity. Mild increased uptake within right axillary lymph nodes. Index right axillary lymph node measures 5 mm and has an SUV max equal to 1.8, image number 58 of series 4. Aortic atherosclerosis. Calcification within the LAD and left circumflex coronary artery. No hypermetabolic mediastinal or hilar lymph nodes identified. There is no pleural fluid identified. Moderate to advanced changes of centrilobular and paraseptal emphysema. No hypermetabolic pulmonary nodule identified. ABDOMEN/PELVIS No abnormal hypermetabolic activity within the liver, pancreas, adrenal glands, or spleen. Aortic atherosclerosis. No hypermetabolic lymph nodes in the abdomen or pelvis. SKELETON No focal hypermetabolic activity to suggest skeletal metastasis. IMPRESSION: 1. No specific findings identified to suggest residual or recurrence of tumor. 2. Increased uptake within right chest wall corresponding to site of Port-A-Cath removal. 3. Aortic atherosclerosis and multi vessel coronary artery calcification. Electronically Signed   By: Kerby Moors M.D.   On: 07/02/2015 08:43      Assesment  Rectovaginal fistula: spontaneous S/P a planned repair attempt by Dr Marcello Moores, unable to isolate and no repair was done  Patient Active Problem List   Diagnosis Date Noted  . Genetic testing 06/03/2015  . Pain in joint, pelvic region and thigh 04/30/2015  . Brain aneurysm 04/25/2015  . Dehydration 04/22/2015  .  Back pain 04/19/2015  . Renal insufficiency 04/19/2015  . Lung nodule 12/11/2014  . Ankle edema 10/02/2014  . Hospital-acquired pneumonia 09/28/2014  . Acquired absence of breast and nipple 09/25/2014  . Neuropathy due to chemotherapeutic drug ([place]) 08/07/2014  . Chemotherapy induced neutropenia ([place]) 08/07/2014  . Insomnia 07/31/2014  . 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 Atherosclerosis 07/17/2014  . Irregular bowel habits 06/27/2014  . Mucositis due to chemotherapy 06/05/2014  . Diarrhea 05/29/2014  . Pain at surgical site 05/07/2014  . Family history of malignant neoplasm of gastrointestinal tract 03/07/2014  . Family history of malignant neoplasm of breast 03/07/2014  . Family history of malignant neoplasm of ovary 03/07/2014  . Breast cancer of upper-outer quadrant of left female breast ([place]) 02/26/2014  . CAD (coronary artery disease) 04/16/2013  . Intractable migraine without aura 02/16/2013  . COPD exacerbation ([place]) 02/01/2013  . S/P Nissen fundoplication (without gastrostomy tube) procedure 01/05/2013  . COPD (chronic obstructive pulmonary disease) ([place]) 01/03/2013  . Chest pain 12/25/2012  . Hypothyroidism 12/25/2012  . Tobacco abuse 12/25/2012  Plan: Repair of rectovaginal fisula  Sequoya Hogsett H 07/08/2015 4:40 PM

## 2015-07-10 ENCOUNTER — Ambulatory Visit (HOSPITAL_BASED_OUTPATIENT_CLINIC_OR_DEPARTMENT_OTHER): Payer: BLUE CROSS/BLUE SHIELD | Admitting: Oncology

## 2015-07-10 ENCOUNTER — Telehealth: Payer: Self-pay | Admitting: Oncology

## 2015-07-10 VITALS — BP 106/64 | HR 67 | Temp 98.0°F | Resp 18 | Ht 66.0 in | Wt 166.2 lb

## 2015-07-10 DIAGNOSIS — Z853 Personal history of malignant neoplasm of breast: Secondary | ICD-10-CM

## 2015-07-10 DIAGNOSIS — G620 Drug-induced polyneuropathy: Secondary | ICD-10-CM

## 2015-07-10 DIAGNOSIS — N183 Chronic kidney disease, stage 3 (moderate): Secondary | ICD-10-CM | POA: Diagnosis not present

## 2015-07-10 DIAGNOSIS — C50412 Malignant neoplasm of upper-outer quadrant of left female breast: Secondary | ICD-10-CM

## 2015-07-10 DIAGNOSIS — N823 Fistula of vagina to large intestine: Secondary | ICD-10-CM | POA: Diagnosis not present

## 2015-07-10 NOTE — Patient Instructions (Addendum)
    Stacy Fernandez  07/10/2015     @PREFPERIOPPHARMACY @   Your procedure is scheduled on 07/17/15.  Report to [facility] at  8:25 A.M.  Call this number if you have problems the morning of surgery:  [phone]   Remember:  Do not eat food or drink liquids after midnight.  Take these medicines the morning of surgery with A SIP OF WATER verapamil, Ultram, Pro Air, Imitrex, Maxalt, Wellbutrin, Duoneb, Flonase, Gabapentin, Synthroid  Ativan, Singulair   Do not wear jewelry, make-up or nail polish.  Do not wear lotions, powders, or perfumes.  You may wear deodorant.  Do not shave 48 hours prior to surgery.  Men may shave face and neck.  Do not bring valuables to the hospital.  [facility] is not responsible for any belongings or valuables.  Contacts, dentures or bridgework may not be worn into surgery.  Leave your suitcase in the car.  After surgery it may be brought to your room.  For patients admitted to the hospital, discharge time will be determined by your treatment team.  Patients discharged the day of surgery will not be allowed to drive home.   Please read over the following fact sheets that you were given. Surgical Site Infection Prevention and Anesthesia Post-op Instructions     PATIENT INSTRUCTIONS POST-ANESTHESIA  IMMEDIATELY FOLLOWING SURGERY:  Do not drive or operate machinery for the first twenty four hours after surgery.  Do not make any important decisions for twenty four hours after surgery or while taking narcotic pain medications or sedatives.  If you develop intractable nausea and vomiting or a severe headache please notify your doctor immediately.  FOLLOW-UP:  Please make an appointment with your surgeon as instructed. You do not need to follow up with anesthesia unless specifically instructed to do so.  WOUND CARE INSTRUCTIONS (if applicable):  Keep a dry clean dressing on the anesthesia/puncture wound site if there is drainage.  Once the wound has quit  draining you may leave it open to air.  Generally you should leave the bandage intact for twenty four hours unless there is drainage.  If the epidural site drains for more than 36-48 hours please call the anesthesia department.  QUESTIONS?:  Please feel free to call your physician or the hospital operator if you have any questions, and they will be happy to assist you.

## 2015-07-10 NOTE — Progress Notes (Signed)
[facility]  [phone] [phone]     ID: [name] DOB: Jun 02, 1961  MR#: [id number]  XKG#:[id number]  Patient Care Team: Pcp Not In System as PCP - General [name], MD as PCP - Family Medicine (Nurse Practitioner) [name], MD as Consulting Physician (Radiation Oncology) [name], MD as Consulting Physician (General Surgery) [name], MD as Consulting Physician (Oncology) [name], MD as Consulting Physician (Gastroenterology) [name], MD as Consulting Physician (Pulmonary Disease) OTHER MD:  [name] MD, [name] MD  CHIEF COMPLAINT: HER-2 positive, 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 She brought it to her primary physician's attention him a and on 02/21/2014 she underwent bilateral diagnostic mammography with tomography at [facility]. The breast composition was category B. In the left breast at the 3:00 position there was an irregular mass associated with skin retraction. Ultrasound performed on the same day confirmed a 1.1 cm irregular solid mass in the left breast at the 3:00 position. There were no abnormalities by sonography in the left axilla or in the superior portion of the breast, which is for the patient experiences some tenderness.  Biopsy of the left breast area in question 02/22/2014 showed ([id number]) and invasive ductal carcinoma, grade 3, estrogen and progesterone receptor negative, with an MIB-1 of 83%, and HER-2 amplified, the signals ratio being 2.07, and a copy number per cell 4.75.  On 02/28/2014 the patient underwent bilateral breast MRI, which showed in the posterior third of the left breast at the 3:00 position an irregular spiculated mass measuring 1.8 cm. The left breast was unremarkable and  there were no abnormal appearing lymph nodes.  The patient's subsequent history is as detailed below  INTERVAL HISTORY: [name] returns today for follow up of her  Estrogen receptor negativebreast cancer, accompanied by her husband [name]. From a breast cancer point of view she is doing well. We just obtained a restaging PET scan which is negative except that the site of her port him a which was recently removed, where there is still bit of healing, which shows up "hot" on the PET.  REVIEW OF SYSTEMS:   she was also had to have her surgery for the rectovaginal fistula repair and then it was canceled at the very last minute. She is now scheduled with Dr.Eure for the week after Christmas. She is already on preoperative antibiotics (Cipro and Flagyl). She is hoping that the surgery will relieve her pelvic pain endovaginal problems that are so embarrassing to her. She does have some sinus issues, minimal epistaxis, but no cough or phlegm production. She can be short of breath when walking stairs. Occasionally she has headaches, but they're mild and infrequent. She feels anxious but not depressed. She is been working 7 day weeks for a while now. She will have a few days off after Christmas and of course after her upcoming surgery as well. A detailed review of systems today was otherwise stable  PAST MEDICAL HISTORY: Past Medical History  Diagnosis Date  . Hypothyroidism   . Wears glasses   . Wears dentures     upper  . History of chemotherapy     left breast ca-- 05-09-2015   to 04-30-2015  . History of gastroesophageal reflux (GERD)     no problems since Nissen fundoplication  .  Chemotherapy-induced peripheral neuropathy (HCC)     FEET  . Rectovaginal fistula   . Anxiety   . Supraclinoid carotid artery aneurysm, small [facility]) neurologist-  dr willis/  dr Krista Blue    left internal - 2 mm;  aneurysm  vs.  infundibulum--  residual chronic migraine  . Chronic migraine     caused by brain aneurysm--  BOTOX  TX'S  by dr Krista Blue  . COPD with emphysema [facility]) pulmologist--  dr Elsworth Soho    advanded emphysema per CT   . Skin changes related to chemotherapy     ARMS  . 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 CKD (chronic kidney disease), stage III     PAST SURGICAL HISTORY: Past Surgical History  Procedure Laterality Date  . Laparoscopic nissen fundoplication  20-04-711  . Video bronchoscopy Bilateral 01/16/2013    Procedure: VIDEO BRONCHOSCOPY WITHOUT FLUORO;  Surgeon: [name], MD;  Location: [facility];  Service: Cardiopulmonary;  Laterality: Bilateral;  . Simple mastectomy with axillary sentinel node biopsy Bilateral 04/02/2014    Procedure: LEFT TOTAL MASTECTOMY WITH LEFT AXILLARY SENTINEL NODE BIOPSY, RIGHT PROPHYLACTIC MASTETCTOMY;  Surgeon: [name], MD;  Location: [place];  Service: General;  Laterality: Bilateral;  . Portacath placement N/A 04/02/2014    Procedure: INSERTION PORT-A-CATH;  Surgeon: [name], MD;  Location: [place];  Service: General;  Laterality: N/A;  . Left heart catheterization with coronary angiogram N/A 12/26/2012    Procedure: LEFT HEART CATHETERIZATION WITH CORONARY ANGIOGRAM;  Surgeon: Lorretta Harp, MD;  Location: [facility];  Service: Cardiovascular;  Laterality: N/A;   Nonobstructive CAD/  40-50% ostial D1/  normal LVF, ef 60%  . Breast reconstruction with placement of tissue expander and flex hd (acellular hydrated dermis) Bilateral 09/25/2014    Procedure: PLACEMENT OF BILATERAL TISSUE EXPANDER AND  ACELLULAR DERMIS FOR BREAST RECONSTRUCTION ;  Surgeon: [name], MD;  Location: [place];  Service: Plastics;  Laterality: Bilateral;  . Esophagogastroduodenoscopy (egd) with propofol  01/18/2013  . Removal of bilateral tissue  expanders with placement of bilateral breast implants Bilateral 12/18/2014    Procedure: REMOVAL OF BILATERAL TISSUE EXPANDERS,PLACEMENT SILICONE IMPLANTS ;  Surgeon: [name], MD;  Location: [place];  Service: Plastics;  Laterality: Bilateral;  . Liposuction with lipofilling Bilateral 12/18/2014    Procedure: LIPOFILLING TO BILATERAL CHEST;  Surgeon: [name], MD;  Location: [place];  Service: Plastics;  Laterality: Bilateral;  . Transthoracic echocardiogram  02-04-2015    grade 1 diastolic dysfunction, ef 19-75%/  trivial TR  . Appendectomy  1992  . Cholecystectomy  1999  . Total abdominal hysterectomy w/ bilateral salpingoophorectomy  1996  . Port-a-cath removal Right 06/28/2015    Procedure: REMOVAL PORT-A-CATH;  Surgeon: Leighton Ruff, MD;  Location: [facility];  Service: General;  Laterality: Right;    FAMILY HISTORY Family History  Problem Relation Age of Onset  . Heart failure Mother   . Colon cancer Father 91    stomach cancer also in 40s  . Throat cancer Brother 60    smoker  . Heart attack Maternal Grandmother   . Colon cancer Paternal Grandmother 72  . Cancer Paternal Grandfather     kidney and bladder  .  Throat cancer Brother 37    throat cancer, smoker  . Ovarian cancer Sister 80    ovarian cancer at 44, colorectal cancer at 6  . Breast cancer Paternal Aunt 52  . Ovarian cancer Other 3    niece with ovarian cancer   the patient's father died at the age of 39 from metastatic stomach cancer the patient's father's mother died from colon cancer at the age of 16. The patient's mother died at the age of 87. The patient had 2 brothers and 2 sisters. One brother died at the age of 30 from throat cancer metastatic to the lung. He was a smoker. A second brother Was diagnosed at age 38 with throat cancer metastatic to lung. He has been given 90 days to live" is not looking good". One sister died from colon cancer  metastatic to bone. One sister died of a drug overdose. There is no history of breast or bearing cancer in the family to the patient's knowledge.  GYNECOLOGIC HISTORY:  No LMP recorded. Patient has had a hysterectomy. Menarche age 3, first live birth age 89, the patient is [place] P4. She underwent hysterectomy with bilateral salpingo-oophorectomy approximately 1990. She took hormone replacement for approximately 2 years.  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 Her (second) husband, [name], is retired. The patient has 2 sons and 2 daughters from an earlier marriage, all living nearby him a and of course she has 4 stepchildren through her current husband. Altogether they have 19 grandchildren. She attends a local [employer]: Not in place   HEALTH MAINTENANCE: Social History  Substance Use Topics  . Smoking status: Former Smoker -- 0.00 packs/day for 37 years    Types: Cigarettes, E-cigarettes    Quit date: 03/29/2014  . Smokeless tobacco: Never Used     Comment: uses vapor occasionally  . Alcohol Use: No     Colonoscopy: August 2015  PAP: May 2013  Bone density:  Lipid panel:  Allergies  Allergen Reactions  . Contrast Media [Iodinated Diagnostic Agents] Anaphylaxis  . Prednisone Shortness Of Breath and Swelling    CAN TOLERATE IF GIVEN BENADRYL PRIOR     Current Outpatient Prescriptions  Medication Sig Dispense Refill  . buPROPion (WELLBUTRIN XL) 300 MG 24 hr tablet Take 300 mg by mouth every morning. Reported on 07/08/2015    . ciprofloxacin (CIPRO) 500 MG tablet Take 1 tablet (500 mg total) by mouth 2 (two) times daily. 20 tablet 0  . Diclofenac Potassium (CAMBIA) 50 MG PACK Take at onset of  migraine.  May repeat in 2 hours, if needed.  Max: 2 packs in 24 hours. 9 each 5  . DUONEB 0.5-2.5 (3) MG/3ML SOLN Inhale 3 mLs into the  lungs every 6 (six) hours as needed (for shortness of breath). Reported on 07/08/2015  3  . fluticasone (FLONASE) 50 MCG/ACT nasal spray Place 1 spray into both nostrils daily as needed for allergies or rhinitis.    . furosemide (LASIX) 20 MG tablet Take 0.5 tablets (10 mg total) by mouth every morning. 30 tablet 0  . gabapentin (NEURONTIN) 300 MG capsule TAKE 1 CAPSULE (300 MG TOTAL) BY MOUTH 4 (FOUR) TIMES DAILY.---  CURRENTLY PT TAKES ONLY IN PM (Patient taking differently: Take 300 mg by mouth daily. ) 120 capsule 3  . Garcinia Cambogia-Chromium 500-200 MG-MCG TABS Take  1 tablet by mouth 2 (two) times daily. Reported on 07/08/2015    . levothyroxine (SYNTHROID, LEVOTHROID) 125 MCG tablet Take 125 mcg by mouth daily before breakfast.    . metroNIDAZOLE (FLAGYL) 500 MG tablet Take 1 tablet (500 mg total) by mouth 2 (two) times daily. 20 tablet 0  . montelukast (SINGULAIR) 10 MG tablet Take 10 mg by mouth at bedtime.     . potassium chloride SA (KLOR-CON M20) 20 MEQ tablet TAKE 1 TABLET (20 MEQ TOTAL) BY MOUTH ONCE DAY--- TAKES IN AM 30 tablet 1  . PROAIR HFA 108 (90 BASE) MCG/ACT inhaler Inhale 2 puffs into the lungs every 4 (four) hours as needed for wheezing or shortness of breath. Reported on 07/08/2015    . rizatriptan (MAXALT-MLT) 10 MG disintegrating tablet Take 10 mg by mouth as needed for migraine. May repeat in 2 hours if needed    . SUMAtriptan (IMITREX) 6 MG/0.5ML SOLN injection Inject 0.5 mLs (6 mg total) into the skin every 2 (two) hours as needed for migraine or headache. May repeat in 2 hours if headache persists or recurs. 12 vial 6  . traMADol (ULTRAM) 50 MG tablet Take 1-2 tablets (50-100 mg total) by mouth every 6 (six) hours as needed. 20 tablet 0  . traZODone (DESYREL) 100 MG tablet TAKE 1 TABLET (100 MG TOTAL) BY MOUTH AT BEDTIME. 30 tablet 1  . verapamil (CALAN-SR) 120 MG CR tablet TAKE 1 TABLET BY MOUTH AT BEDTIME ( MAX 30 DAY SUPPLY PER INSURANCE)  --- migraine prevention 30  tablet 6   No current facility-administered medications for this visit.    OBJECTIVE: Middle-aged white woman i who appears stated age 38 Vitals:   07/10/15 1333  BP: 106/64  Pulse: 67  Temp: 98 F (36.7 C)  Resp: 18     Body mass index is 26.84 kg/(m^2).    ECOG FS:1 - Symptomatic but completely ambulatory   Sclerae unicteric, EOMs intact Oropharynx clear, d no thrush or other lesions No cervical or supraclavicular adenopathy Lungs no rales or rhonchi Heart regular rate and rhythm Abd soft, nontender, positive bowel sounds MSK no focal spinal tenderness, no upper extremity lymphedema Neuro: nonfocal, well oriented, appropriate affect Breasts:  Status post bilateral mastectomies with implants in place. There is no evidence of chest wall recurrence. Both axillae are benign.    LAB RESULTS:  CMP     Component Value Date/Time   NA 141 07/02/2015 0843   NA 140 06/28/2015 1319   K 4.2 07/02/2015 0843   K 3.9 06/28/2015 1319   CL 110 02/18/2015 0251   CO2 24 07/02/2015 0843   CO2 20* 02/18/2015 0251   GLUCOSE 92 07/02/2015 0843   GLUCOSE 89 06/28/2015 1319   BUN 12.4 07/02/2015 0843   BUN 15 02/18/2015 0251   CREATININE 1.0 07/02/2015 0843   CREATININE 1.02* 02/18/2015 0251   CALCIUM 9.1 07/02/2015 0843   CALCIUM 9.0 02/18/2015 0251   PROT 6.8 07/02/2015 0843   PROT 6.8 02/18/2015 0251   ALBUMIN 3.9 07/02/2015 0843   ALBUMIN 4.0 02/18/2015 0251   AST 14 07/02/2015 0843   AST 21 02/18/2015 0251   ALT 14 07/02/2015 0843   ALT 17 02/18/2015 0251   ALKPHOS 118 07/02/2015 0843   ALKPHOS 96 02/18/2015 0251   BILITOT 0.49 07/02/2015 0843   BILITOT 0.3 02/18/2015 0251   GFRNONAA >60 02/18/2015 0251   GFRAA >60 02/18/2015 0251    I No results found for: SPEP  Lab Results  Component Value Date   WBC 3.8* 07/02/2015   NEUTROABS 2.4 07/02/2015   HGB 11.2* 07/02/2015   HCT 32.3* 07/02/2015   MCV 90.5 07/02/2015   PLT 184 07/02/2015      Chemistry        Component Value Date/Time   NA 141 07/02/2015 0843   NA 140 06/28/2015 1319   K 4.2 07/02/2015 0843   K 3.9 06/28/2015 1319   CL 110 02/18/2015 0251   CO2 24 07/02/2015 0843   CO2 20* 02/18/2015 0251   BUN 12.4 07/02/2015 0843   BUN 15 02/18/2015 0251   CREATININE 1.0 07/02/2015 0843   CREATININE 1.02* 02/18/2015 0251      Component Value Date/Time   CALCIUM 9.1 07/02/2015 0843   CALCIUM 9.0 02/18/2015 0251   ALKPHOS 118 07/02/2015 0843   ALKPHOS 96 02/18/2015 0251   AST 14 07/02/2015 0843   AST 21 02/18/2015 0251   ALT 14 07/02/2015 0843   ALT 17 02/18/2015 0251   BILITOT 0.49 07/02/2015 0843   BILITOT 0.3 02/18/2015 0251       No results found for: LABCA2  No components found for: [id number]  No results for input(s): INR in the last 168 hours.  Urinalysis    Component Value Date/Time   COLORURINE YELLOW 02/18/2015 0510   APPEARANCEUR CLOUDY* 02/18/2015 0510   LABSPEC 1.018 02/18/2015 0510   LABSPEC 1.010 07/10/2014 1004   PHURINE 5.0 02/18/2015 0510   PHURINE 6.0 07/10/2014 1004   GLUCOSEU NEGATIVE 02/18/2015 0510   GLUCOSEU Negative 07/10/2014 1004   HGBUR SMALL* 02/18/2015 0510   HGBUR Moderate 07/10/2014 1004   BILIRUBINUR NEGATIVE 02/18/2015 0510   BILIRUBINUR Negative 07/10/2014 1004   KETONESUR NEGATIVE 02/18/2015 0510   KETONESUR Negative 07/10/2014 1004   PROTEINUR NEGATIVE 02/18/2015 0510   PROTEINUR Negative 07/10/2014 1004   UROBILINOGEN 0.2 02/18/2015 0510   UROBILINOGEN 0.2 07/10/2014 1004   NITRITE NEGATIVE 02/18/2015 0510   NITRITE Negative 07/10/2014 1004   LEUKOCYTESUR MODERATE* 02/18/2015 0510   LEUKOCYTESUR Negative 07/10/2014 1004    STUDIES: Nm Pet Image Restag (ps) Skull Base To Thigh  07/02/2015  CLINICAL DATA:  Subsequent treatment strategy for breast cancer. EXAM: NUCLEAR MEDICINE PET SKULL BASE TO THIGH TECHNIQUE: 7.9 mCi F-18 FDG was injected intravenously. Full-ring PET imaging was performed from the skull base to thigh  after the radiotracer. CT data was obtained and used for attenuation correction and anatomic localization. FASTING BLOOD GLUCOSE:  Value: 89 mg/dl COMPARISON:  03/12/2014 FINDINGS: NECK No hypermetabolic lymph nodes in the neck. CHEST No hypermetabolic mediastinal or hilar nodes. No suspicious pulmonary nodules on the CT scan. Subcutaneous fat stranding and increased uptake within the ventral chest wall on the right measures 1.8 cm and has an SUV max equal to 5.27. This may be related to previous Port-A-Cath cavity. Mild increased uptake within right axillary lymph nodes. Index right axillary lymph node measures 5 mm and has an SUV max equal to 1.8, image number 58 of series 4. Aortic atherosclerosis. Calcification within the LAD and left circumflex coronary artery. No hypermetabolic mediastinal or hilar lymph nodes identified. There is no pleural fluid identified. Moderate to advanced changes of centrilobular and paraseptal emphysema. No hypermetabolic pulmonary nodule identified. ABDOMEN/PELVIS No abnormal hypermetabolic activity within the liver, pancreas, adrenal glands, or spleen. Aortic atherosclerosis. No hypermetabolic lymph nodes in the abdomen or pelvis. SKELETON No focal hypermetabolic activity to suggest skeletal metastasis. IMPRESSION: 1. No specific findings identified to suggest residual  or recurrence of tumor. 2. Increased uptake within right chest wall corresponding to site of Port-A-Cath removal. 3. Aortic atherosclerosis and multi vessel coronary artery calcification. Electronically Signed   By: Kerby Moors M.D.   On: 07/02/2015 08:43     ASSESSMENT: 54 y.o. BRCA negative [place] woman status post left breast biopsy 02/22/2014 for a clinical T2/T3 NX, stage II or III invasive ductal carcinoma, grade 3, estrogen and progesterone receptor negative, with an MIB-1 of 83%, and HER-2 amplified with a signals a ratio of 2.07and a copy number per cell of 4.75  (1) biopsy of an additional area  of enhancement in the left breast 03/08/2014 showed ductal carcinoma in situ, estrogen and progesterone receptor negative.  (2) status post bilateral mastectomies with left sentinel lymph node sampling 04/02/2014, showing:  (a) on the right, benign breast tissue including a single negative lymph node  (b) On  the left, a  pT1c pN0, stage IA invasive ductal carcinoma, grade 3, with negative margins  (3) adjuvant chemotherapy started 05/08/2014, consisting of carboplatin and docetaxel given every 3 weeks x6, together with trastuzumab, with neulasta day 2   (a) docetaxel removed from final cycle 08/21/2014 because of persistent neuropathy symptoms  (4) trastuzumab continued to complete a year (last dose 04/30/2015)  (a) echo 02/04/15 showed a well-preserved ejection fraction  (5) reconstruction with bilateral silicone implants 34/28/7681 ([place])  (6) tobacco abuse: The patient quit smoking 03/30/2014  (7) peripheral neuropathy secondary to chemotherapy: chiefly involving feet  (8) lung nodules resolved on 01/24/15 CT scan  (9) pelvic/ abdominal pain: extensive evaluation shows no evidence of metastatic disease but are consistent with a rectovaginal fistula; gynecological evaluation pending  (10) chronic kidney disease level III:  PLAN: [name] is now a little over a year out from her definitive surgery with no evidence of disease recurrence. We just obtain a restaging PET scan which is entirely negative. This is very favorable.   she is still having significant and vaginal problems and she tells me she is scheduled for repair of her rectovaginal fistula next week at [facility]. She is working with Dr. Elonda Husky  And a general surgeon but she is not sure with a general surgeon is. Recall that she did quit smoking 03/30/2014, which is very favorable.    she still has dry skin and some joint aches and pains which are partly due to simple arthritis and partly due to late effects of  chemotherapy. In my experience it takes over a year to really start to feel normal again and of course psychologically and spiritually it can take up to 3 years usually. She is aware of our " finding your new normal" program and will think about participating.   otherwise she will see me again in 4 months.  If all is going well at that point we will start seeing her on an every 6 month basis.   she knows to call for any problems that may develop before her next visit here.    [name], MD   07/10/2015 1:54 PM

## 2015-07-10 NOTE — Telephone Encounter (Signed)
Appointments made and avs printed for patient °

## 2015-07-11 ENCOUNTER — Inpatient Hospital Stay (HOSPITAL_COMMUNITY): Admission: RE | Admit: 2015-07-11 | Payer: BLUE CROSS/BLUE SHIELD | Source: Ambulatory Visit

## 2015-07-11 ENCOUNTER — Other Ambulatory Visit: Payer: Self-pay | Admitting: Obstetrics & Gynecology

## 2015-07-11 ENCOUNTER — Encounter (HOSPITAL_COMMUNITY): Payer: Self-pay

## 2015-07-11 ENCOUNTER — Encounter (HOSPITAL_COMMUNITY)
Admission: RE | Admit: 2015-07-11 | Discharge: 2015-07-11 | Disposition: A | Discharge status: Home / Self Care | Payer: BLUE CROSS/BLUE SHIELD | Source: Ambulatory Visit | Attending: Obstetrics & Gynecology | Admitting: Obstetrics & Gynecology

## 2015-07-11 DIAGNOSIS — Z01818 Encounter for other preprocedural examination: Secondary | ICD-10-CM | POA: Insufficient documentation

## 2015-07-11 DIAGNOSIS — N823 Fistula of vagina to large intestine: Secondary | ICD-10-CM | POA: Insufficient documentation

## 2015-07-11 LAB — COMPREHENSIVE METABOLIC PANEL
ALBUMIN: 4.3 g/dL (ref 3.5–5.0)
ALK PHOS: 118 U/L (ref 38–126)
ALT: 17 U/L (ref 14–54)
AST: 18 U/L (ref 15–41)
Anion gap: 7 (ref 5–15)
BILIRUBIN TOTAL: 0.6 mg/dL (ref 0.3–1.2)
BUN: 15 mg/dL (ref 6–20)
CALCIUM: 9 mg/dL (ref 8.9–10.3)
CO2: 24 mmol/L (ref 22–32)
CREATININE: 1.19 mg/dL — AB (ref 0.44–1.00)
Chloride: 109 mmol/L (ref 101–111)
GFR calc Af Amer: 59 mL/min — ABNORMAL LOW (ref >60)
GFR, EST NON AFRICAN AMERICAN: 51 mL/min — AB (ref >60)
GLUCOSE: 89 mg/dL (ref 65–99)
POTASSIUM: 3.9 mmol/L (ref 3.5–5.1)
Sodium: 140 mmol/L (ref 135–145)
TOTAL PROTEIN: 7 g/dL (ref 6.5–8.1)

## 2015-07-11 LAB — CBC
HEMATOCRIT: 32 % — AB (ref 36.0–46.0)
HEMOGLOBIN: 10.8 g/dL — AB (ref 12.0–15.0)
MCH: 31.1 pg (ref 26.0–34.0)
MCHC: 33.8 g/dL (ref 30.0–36.0)
MCV: 92.2 fL (ref 78.0–100.0)
Platelets: 233 10*3/uL (ref 150–400)
RBC: 3.47 MIL/uL — ABNORMAL LOW (ref 3.87–5.11)
RDW: 14.1 % (ref 11.5–15.5)
WBC: 4.2 10*3/uL (ref 4.0–10.5)

## 2015-07-11 LAB — TYPE AND SCREEN
ABO/RH(D): O POS
Antibody Screen: NEGATIVE

## 2015-07-16 ENCOUNTER — Telehealth: Payer: Self-pay | Admitting: *Deleted

## 2015-07-16 NOTE — Telephone Encounter (Signed)
Noted   Will not affect at this point  Please tell pt to use a fleets enema x2 today

## 2015-07-16 NOTE — Telephone Encounter (Signed)
Pt states having surgery (Fistula repair) on 07/16/2015 c/o dark tarry stools since Saturday. Did not know if this would affect her having surgery. HGB on 07/11/2015 10.8. Pt informed will route message to Dr.Eure.

## 2015-07-17 ENCOUNTER — Ambulatory Visit (HOSPITAL_COMMUNITY): Payer: BLUE CROSS/BLUE SHIELD | Admitting: Certified Registered"

## 2015-07-17 ENCOUNTER — Observation Stay (HOSPITAL_COMMUNITY)
Admission: RE | Admit: 2015-07-17 | Discharge: 2015-07-18 | Disposition: A | Discharge status: Home / Self Care | Payer: BLUE CROSS/BLUE SHIELD | Source: Ambulatory Visit | Attending: Obstetrics & Gynecology | Admitting: Obstetrics & Gynecology

## 2015-07-17 ENCOUNTER — Encounter (HOSPITAL_COMMUNITY)
Admission: RE | Disposition: A | Discharge status: Home / Self Care | Payer: Self-pay | Source: Ambulatory Visit | Attending: Obstetrics & Gynecology

## 2015-07-17 ENCOUNTER — Encounter (HOSPITAL_COMMUNITY): Payer: Self-pay | Admitting: *Deleted

## 2015-07-17 ENCOUNTER — Ambulatory Visit: Payer: BLUE CROSS/BLUE SHIELD | Admitting: Neurology

## 2015-07-17 DIAGNOSIS — N823 Fistula of vagina to large intestine: Principal | ICD-10-CM | POA: Diagnosis present

## 2015-07-17 DIAGNOSIS — Z79899 Other long term (current) drug therapy: Secondary | ICD-10-CM | POA: Diagnosis not present

## 2015-07-17 DIAGNOSIS — E039 Hypothyroidism, unspecified: Secondary | ICD-10-CM | POA: Insufficient documentation

## 2015-07-17 DIAGNOSIS — N183 Chronic kidney disease, stage 3 (moderate): Secondary | ICD-10-CM | POA: Diagnosis not present

## 2015-07-17 DIAGNOSIS — J449 Chronic obstructive pulmonary disease, unspecified: Secondary | ICD-10-CM | POA: Insufficient documentation

## 2015-07-17 DIAGNOSIS — I2510 Atherosclerotic heart disease of native coronary artery without angina pectoris: Secondary | ICD-10-CM | POA: Diagnosis not present

## 2015-07-17 DIAGNOSIS — Z853 Personal history of malignant neoplasm of breast: Secondary | ICD-10-CM | POA: Diagnosis not present

## 2015-07-17 DIAGNOSIS — Z87891 Personal history of nicotine dependence: Secondary | ICD-10-CM | POA: Insufficient documentation

## 2015-07-17 DIAGNOSIS — F419 Anxiety disorder, unspecified: Secondary | ICD-10-CM | POA: Diagnosis not present

## 2015-07-17 DIAGNOSIS — Z09 Encounter for follow-up examination after completed treatment for conditions other than malignant neoplasm: Secondary | ICD-10-CM

## 2015-07-17 DIAGNOSIS — Z7951 Long term (current) use of inhaled steroids: Secondary | ICD-10-CM | POA: Diagnosis not present

## 2015-07-17 HISTORY — PX: VESICO-VAGINAL FISTULA REPAIR: SHX5129

## 2015-07-17 SURGERY — CLOSURE, FISTULA, VESICOVAGINAL
Anesthesia: General

## 2015-07-17 MED ORDER — VERAPAMIL HCL ER 120 MG PO CP24
120.0000 mg | ORAL_CAPSULE | Freq: Every day | ORAL | Status: DC
Start: 2015-07-17 — End: 2015-07-18
  Filled 2015-07-17 (×2): qty 1

## 2015-07-17 MED ORDER — FENTANYL CITRATE (PF) 250 MCG/5ML IJ SOLN
INTRAMUSCULAR | Status: AC
  Filled 2015-07-17: qty 5

## 2015-07-17 MED ORDER — STERILE WATER FOR IRRIGATION IR SOLN
Status: DC | PRN
  Administered 2015-07-17: 1000 mL

## 2015-07-17 MED ORDER — ONDANSETRON HCL 4 MG/2ML IJ SOLN
INTRAMUSCULAR | Status: AC
  Filled 2015-07-17: qty 2

## 2015-07-17 MED ORDER — FENTANYL CITRATE (PF) 100 MCG/2ML IJ SOLN
INTRAMUSCULAR | Status: DC | PRN
  Administered 2015-07-17 (×5): 50 ug via INTRAVENOUS

## 2015-07-17 MED ORDER — TRAZODONE HCL 50 MG PO TABS
100.0000 mg | ORAL_TABLET | Freq: Every day | ORAL | Status: DC
  Administered 2015-07-17: 100 mg via ORAL
  Filled 2015-07-17 (×2): qty 1
  Filled 2015-07-17: qty 2

## 2015-07-17 MED ORDER — ALBUTEROL SULFATE HFA 108 (90 BASE) MCG/ACT IN AERS: 2.0000 | INHALATION_SPRAY | RESPIRATORY_TRACT | Status: DC | PRN

## 2015-07-17 MED ORDER — HYDROMORPHONE HCL 1 MG/ML IJ SOLN
1.0000 mg | INTRAMUSCULAR | Status: DC | PRN
Start: 2015-07-17 — End: 2015-07-18
  Administered 2015-07-17 – 2015-07-18 (×2): 1 mg via INTRAVENOUS
  Filled 2015-07-17 (×2): qty 1

## 2015-07-17 MED ORDER — ONDANSETRON HCL 4 MG PO TABS
8.0000 mg | ORAL_TABLET | Freq: Four times a day (QID) | ORAL | Status: DC | PRN
  Filled 2015-07-17: qty 1

## 2015-07-17 MED ORDER — MIDAZOLAM HCL 2 MG/2ML IJ SOLN
INTRAMUSCULAR | Status: AC
  Filled 2015-07-17: qty 2

## 2015-07-17 MED ORDER — ONDANSETRON HCL 4 MG/2ML IJ SOLN: 4.0000 mg | Freq: Once | INTRAMUSCULAR | Status: DC | PRN

## 2015-07-17 MED ORDER — CIPROFLOXACIN HCL 250 MG PO TABS
500.0000 mg | ORAL_TABLET | Freq: Two times a day (BID) | ORAL | Status: DC
  Administered 2015-07-17 – 2015-07-18 (×2): 500 mg via ORAL
  Filled 2015-07-17 (×2): qty 1
  Filled 2015-07-17: qty 2
  Filled 2015-07-17 (×2): qty 1
  Filled 2015-07-17: qty 2

## 2015-07-17 MED ORDER — PROPOFOL 10 MG/ML IV BOLUS
INTRAVENOUS | Status: AC
  Filled 2015-07-17: qty 20

## 2015-07-17 MED ORDER — HYDROMORPHONE HCL 1 MG/ML IJ SOLN
INTRAMUSCULAR | Status: AC
  Filled 2015-07-17: qty 1

## 2015-07-17 MED ORDER — FUROSEMIDE 20 MG PO TABS
10.0000 mg | ORAL_TABLET | Freq: Every morning | ORAL | Status: DC
  Administered 2015-07-18: 10 mg via ORAL
  Filled 2015-07-17: qty 1

## 2015-07-17 MED ORDER — HYDROMORPHONE HCL 1 MG/ML IJ SOLN: 0.5000 mg | INTRAMUSCULAR | Status: DC | PRN

## 2015-07-17 MED ORDER — SUMATRIPTAN SUCCINATE 6 MG/0.5ML ~~LOC~~ SOLN
6.0000 mg | SUBCUTANEOUS | Status: DC | PRN
  Filled 2015-07-17: qty 0.5

## 2015-07-17 MED ORDER — SURGILUBE EX GEL
CUTANEOUS | Status: DC | PRN
  Administered 2015-07-17: 1 via TOPICAL

## 2015-07-17 MED ORDER — MIDAZOLAM HCL 2 MG/2ML IJ SOLN
1.0000 mg | INTRAMUSCULAR | Status: DC | PRN
  Administered 2015-07-17 (×2): 2 mg via INTRAVENOUS
  Filled 2015-07-17: qty 2

## 2015-07-17 MED ORDER — 0.9 % SODIUM CHLORIDE (POUR BTL) OPTIME
TOPICAL | Status: DC | PRN
  Administered 2015-07-17: 1000 mL

## 2015-07-17 MED ORDER — KCL IN DEXTROSE-NACL 20-5-0.45 MEQ/L-%-% IV SOLN
INTRAVENOUS | Status: DC
  Administered 2015-07-17: 18:00:00 via INTRAVENOUS

## 2015-07-17 MED ORDER — GLYCOPYRROLATE 0.2 MG/ML IJ SOLN
INTRAMUSCULAR | Status: AC
  Filled 2015-07-17: qty 1

## 2015-07-17 MED ORDER — FENTANYL CITRATE (PF) 100 MCG/2ML IJ SOLN
INTRAMUSCULAR | Status: AC
  Filled 2015-07-17: qty 2

## 2015-07-17 MED ORDER — GLYCOPYRROLATE 0.2 MG/ML IJ SOLN
INTRAMUSCULAR | Status: AC
  Filled 2015-07-17: qty 2

## 2015-07-17 MED ORDER — ONDANSETRON HCL 4 MG/2ML IJ SOLN
4.0000 mg | Freq: Once | INTRAMUSCULAR | Status: AC
  Administered 2015-07-17: 4 mg via INTRAVENOUS
  Filled 2015-07-17: qty 2

## 2015-07-17 MED ORDER — HYDROCODONE-ACETAMINOPHEN 5-325 MG PO TABS
1.0000 | ORAL_TABLET | ORAL | Status: DC | PRN
  Administered 2015-07-17: 1 via ORAL
  Administered 2015-07-18 (×2): 2 via ORAL
  Filled 2015-07-17: qty 2
  Filled 2015-07-17: qty 1
  Filled 2015-07-17: qty 2

## 2015-07-17 MED ORDER — GABAPENTIN 300 MG PO CAPS
300.0000 mg | ORAL_CAPSULE | Freq: Every day | ORAL | Status: DC
  Administered 2015-07-18: 300 mg via ORAL
  Filled 2015-07-17: qty 1

## 2015-07-17 MED ORDER — METRONIDAZOLE 500 MG PO TABS
500.0000 mg | ORAL_TABLET | Freq: Two times a day (BID) | ORAL | Status: DC
  Administered 2015-07-17 – 2015-07-18 (×2): 500 mg via ORAL
  Filled 2015-07-17 (×2): qty 1

## 2015-07-17 MED ORDER — LIDOCAINE HCL (PF) 1 % IJ SOLN
INTRAMUSCULAR | Status: AC
  Filled 2015-07-17: qty 5

## 2015-07-17 MED ORDER — FENTANYL CITRATE (PF) 100 MCG/2ML IJ SOLN
25.0000 ug | INTRAMUSCULAR | Status: DC | PRN
  Administered 2015-07-17 (×4): 50 ug via INTRAVENOUS

## 2015-07-17 MED ORDER — VERAPAMIL HCL ER 120 MG PO TBCR
120.0000 mg | EXTENDED_RELEASE_TABLET | Freq: Every day | ORAL | Status: DC
  Filled 2015-07-17 (×2): qty 1

## 2015-07-17 MED ORDER — NEOSTIGMINE METHYLSULFATE 10 MG/10ML IV SOLN
INTRAVENOUS | Status: DC | PRN
  Administered 2015-07-17: 4 mg via INTRAVENOUS

## 2015-07-17 MED ORDER — BUPROPION HCL ER (XL) 300 MG PO TB24
300.0000 mg | ORAL_TABLET | Freq: Every morning | ORAL | Status: DC
Start: 2015-07-18 — End: 2015-07-18
  Administered 2015-07-18: 300 mg via ORAL
  Filled 2015-07-17 (×2): qty 1

## 2015-07-17 MED ORDER — ONDANSETRON HCL 4 MG/2ML IJ SOLN
4.0000 mg | Freq: Once | INTRAMUSCULAR | Status: AC
  Administered 2015-07-17: 4 mg via INTRAVENOUS

## 2015-07-17 MED ORDER — DOCUSATE SODIUM 100 MG PO CAPS
100.0000 mg | ORAL_CAPSULE | Freq: Two times a day (BID) | ORAL | Status: DC
  Administered 2015-07-17 – 2015-07-18 (×2): 100 mg via ORAL
  Filled 2015-07-17 (×2): qty 1

## 2015-07-17 MED ORDER — ROCURONIUM BROMIDE 50 MG/5ML IV SOLN
INTRAVENOUS | Status: AC
  Filled 2015-07-17: qty 1

## 2015-07-17 MED ORDER — ALBUTEROL SULFATE (2.5 MG/3ML) 0.083% IN NEBU: 2.5000 mg | INHALATION_SOLUTION | RESPIRATORY_TRACT | Status: DC | PRN

## 2015-07-17 MED ORDER — LACTATED RINGERS IV SOLN
INTRAVENOUS | Status: DC
  Administered 2015-07-17 (×3): via INTRAVENOUS

## 2015-07-17 MED ORDER — CIPROFLOXACIN HCL 500 MG PO TABS: 500.0000 mg | ORAL_TABLET | Freq: Two times a day (BID) | ORAL | Status: DC

## 2015-07-17 MED ORDER — METRONIDAZOLE 500 MG PO TABS: 500.0000 mg | ORAL_TABLET | Freq: Two times a day (BID) | ORAL | Status: DC

## 2015-07-17 MED ORDER — CEFAZOLIN SODIUM-DEXTROSE 2-3 GM-% IV SOLR
2.0000 g | INTRAVENOUS | Status: AC
  Administered 2015-07-17: 2 g via INTRAVENOUS
  Filled 2015-07-17: qty 50

## 2015-07-17 MED ORDER — PROPOFOL 10 MG/ML IV BOLUS
INTRAVENOUS | Status: DC | PRN
  Administered 2015-07-17: 130 mg via INTRAVENOUS

## 2015-07-17 MED ORDER — IPRATROPIUM-ALBUTEROL 0.5-2.5 (3) MG/3ML IN SOLN: 3.0000 mL | Freq: Four times a day (QID) | RESPIRATORY_TRACT | Status: DC | PRN

## 2015-07-17 MED ORDER — GLYCOPYRROLATE 0.2 MG/ML IJ SOLN
INTRAMUSCULAR | Status: DC | PRN
  Administered 2015-07-17: 0.6 mg via INTRAVENOUS

## 2015-07-17 MED ORDER — EPHEDRINE SULFATE 50 MG/ML IJ SOLN
INTRAMUSCULAR | Status: DC | PRN
  Administered 2015-07-17 (×3): 10 mg via INTRAVENOUS

## 2015-07-17 MED ORDER — LEVOTHYROXINE SODIUM 125 MCG PO TABS
125.0000 ug | ORAL_TABLET | Freq: Every day | ORAL | Status: DC
  Administered 2015-07-18: 125 ug via ORAL
  Filled 2015-07-17 (×2): qty 1

## 2015-07-17 MED ORDER — FLUTICASONE PROPIONATE 50 MCG/ACT NA SUSP: 1.0000 | Freq: Every day | NASAL | Status: DC | PRN

## 2015-07-17 MED ORDER — ROCURONIUM BROMIDE 100 MG/10ML IV SOLN
INTRAVENOUS | Status: DC | PRN
  Administered 2015-07-17: 30 mg via INTRAVENOUS

## 2015-07-17 MED ORDER — MONTELUKAST SODIUM 10 MG PO TABS
10.0000 mg | ORAL_TABLET | Freq: Every day | ORAL | Status: DC
  Administered 2015-07-17: 10 mg via ORAL
  Filled 2015-07-17: qty 1

## 2015-07-17 MED ORDER — LIDOCAINE HCL (CARDIAC) 20 MG/ML IV SOLN
INTRAVENOUS | Status: DC | PRN
  Administered 2015-07-17: 50 mg via INTRAVENOUS

## 2015-07-17 MED ORDER — ARTIFICIAL TEARS OP OINT
TOPICAL_OINTMENT | OPHTHALMIC | Status: AC
  Filled 2015-07-17: qty 3.5

## 2015-07-17 MED ORDER — HYDROMORPHONE HCL 1 MG/ML IJ SOLN
0.5000 mg | INTRAMUSCULAR | Status: AC | PRN
  Administered 2015-07-17 (×8): 0.5 mg via INTRAVENOUS
  Filled 2015-07-17 (×2): qty 1

## 2015-07-17 MED ORDER — KETOROLAC TROMETHAMINE 30 MG/ML IJ SOLN
30.0000 mg | Freq: Once | INTRAMUSCULAR | Status: AC
  Administered 2015-07-17: 30 mg via INTRAVENOUS
  Filled 2015-07-17: qty 1

## 2015-07-17 MED ORDER — SODIUM CHLORIDE 0.9 % IV SOLN: 8.0000 mg | Freq: Four times a day (QID) | INTRAVENOUS | Status: DC | PRN

## 2015-07-17 MED ORDER — OXYCODONE-ACETAMINOPHEN 5-325 MG PO TABS: 1.0000 | ORAL_TABLET | ORAL | Status: DC | PRN

## 2015-07-17 MED ORDER — SENNOSIDES-DOCUSATE SODIUM 8.6-50 MG PO TABS: 1.0000 | ORAL_TABLET | Freq: Every evening | ORAL | Status: DC | PRN

## 2015-07-17 SURGICAL SUPPLY — 29 items
BAG HAMPER (MISCELLANEOUS) ×2 IMPLANT
CATH ROBINSON RED A/P 16FR (CATHETERS) ×1 IMPLANT
CLOTH BEACON ORANGE TIMEOUT ST (SAFETY) ×2 IMPLANT
COVER LIGHT HANDLE STERIS (MISCELLANEOUS) ×4 IMPLANT
DRAPE PROXIMA HALF (DRAPES) ×2 IMPLANT
ELECT REM PT RETURN 9FT ADLT (ELECTROSURGICAL) ×2
ELECTRODE REM PT RTRN 9FT ADLT (ELECTROSURGICAL) ×1 IMPLANT
GAUZE SPONGE 4X4 16PLY XRAY LF (GAUZE/BANDAGES/DRESSINGS) ×1 IMPLANT
GLOVE BIOGEL PI IND STRL 7.0 (GLOVE) IMPLANT
GLOVE BIOGEL PI IND STRL 8 (GLOVE) ×1 IMPLANT
GLOVE BIOGEL PI INDICATOR 7.0 (GLOVE) ×2
GLOVE BIOGEL PI INDICATOR 8 (GLOVE) ×2
GLOVE ECLIPSE 6.5 STRL STRAW (GLOVE) ×1 IMPLANT
GLOVE ECLIPSE 8.0 STRL XLNG CF (GLOVE) ×5 IMPLANT
GLOVE SURG SS PI 8.0 STRL IVOR (GLOVE) ×1 IMPLANT
GOWN STRL REUS W/TWL LRG LVL3 (GOWN DISPOSABLE) ×4 IMPLANT
GOWN STRL REUS W/TWL XL LVL3 (GOWN DISPOSABLE) ×2 IMPLANT
KIT ROOM TURNOVER APOR (KITS) ×2 IMPLANT
MANIFOLD NEPTUNE II (INSTRUMENTS) ×2 IMPLANT
PACK PERI GYN (CUSTOM PROCEDURE TRAY) ×2 IMPLANT
SET BASIN LINEN APH (SET/KITS/TRAYS/PACK) ×2 IMPLANT
SUT MON AB 3-0 SH 27 (SUTURE) ×4 IMPLANT
SUT VIC AB 2-0 CT1 27 (SUTURE) ×2
SUT VIC AB 2-0 CT1 TAPERPNT 27 (SUTURE) IMPLANT
SUT VIC AB 3-0 SH 27 (SUTURE) ×4
SUT VIC AB 3-0 SH 27X BRD (SUTURE) IMPLANT
SUT VICRYL AB 3-0 BRD CT 36IN (SUTURE) ×2 IMPLANT
SYR BULB IRRIGATION 50ML (SYRINGE) ×1 IMPLANT
YANKAUER SUCT 12FT TUBE ARGYLE (SUCTIONS) ×1 IMPLANT

## 2015-07-17 NOTE — Anesthesia Postprocedure Evaluation (Signed)
Anesthesia Post Note  Patient: Lenia Maring  Procedure(s) Performed: Procedure(s) (LRB): REPAIR OF RECTOVAGINAL FISTULA (N/A)  Patient location during evaluation: PACU Anesthesia Type: General Level of consciousness: awake and alert and oriented Pain management: pain level controlled Vital Signs Assessment: post-procedure vital signs reviewed and stable Respiratory status: spontaneous breathing and respiratory function stable Cardiovascular status: stable Postop Assessment: no signs of nausea or vomiting Anesthetic complications: no    Last Vitals:  Filed Vitals:   07/17/15 1035 07/17/15 1215  BP: 93/51 93/52  Temp:  36.7 C  Resp: 19 18    Last Pain:  Filed Vitals:   07/17/15 1238  PainSc: 8                  Tate Zagal A

## 2015-07-17 NOTE — H&P (Addendum)
Expand All Collapse All   Patient ID: Stacy Fernandez, female DOB: 11-02-1960, 54 y.o. MRN: [id number] Chief Complaint  Patient presents with  . Referral    rectovaginal fistula with discarge.    Blood pressure 100/70, pulse 68, height 5\' 6"  (1.676 m), weight 169 lb (76.658 kg).  54 y.o. No obstetric history on file. No LMP recorded. Patient has had a hysterectomy. The current method of family planning is status post hysterectomy.  Subjective This is a new patient visit  Pt with sudden onset over the past couple of weeks of fluid per vagina, copious, yellowish liquid CT scan suggests a rectal vaginal communication No recent pelvic surgery No history of pelvic irradiation No rectl bleeding or other GI symptoms  Objective There is a very small erythematous, indurated area at the right lateral aspect of the vaginal cuff, no fluid or discharge present during exam Rectal no masses palpabble induration is presnt  Pertinent ROS No burning with urination, frequency or urgency No nausea, vomiting or diarrhea Nor fever chills or other constitutional symptoms   Labs or studies CT reviewed with radiologist    Impression Diagnoses this Encounter::   ICD-9-CM ICD-10-CM   1. 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 There is no associated mass  Established relevant diagnosis(es): Breast cncer      Preoperative History and Physical  Stacy Fernandez is a 54 y.o. No obstetric history on file. with No LMP recorded. Patient has had a hysterectomy. admitted for a repair of a spontaneous rectovaginal fistula. i referred patient to  Dr Marcello Moores was unable to demonstrate the fistula but did state there was no anorectal abscess or other infectious process  i have reviewed the CT extensively and the rectal communication is just on the cuff side of the  rectal sphincter  My plan is to essentially create a 3rd degree episiotomy and explore the space isolating and removing any indurated tissue in the hopes of isolating it on the rectal side.  I will resect the involved tissue close the defect and imbricate healthy tissue in multiple layers to provide healthy tissue "space" between the dfect and the vagina  I have explained this extensively to the patient and her husband and they understand  PMH:    Past Medical History  Diagnosis Date  . Hypothyroidism   . Wears glasses   . Wears dentures     upper  . History of chemotherapy     left breast ca-- 05-09-2015   to 04-30-2015  . History of gastroesophageal reflux (GERD)     no problems since Nissen fundoplication  . Chemotherapy-induced peripheral neuropathy (HCC)     FEET  . Rectovaginal fistula   . Anxiety   . Supraclinoid carotid artery aneurysm, small [facility]) neurologist-  dr willis/  dr Krista Blue    left internal - 2 mm;  aneurysm  vs.  infundibulum--  residual chronic migraine  . Chronic migraine     caused by brain aneurysm--  BOTOX TX'S  by dr Krista Blue  . COPD with emphysema [facility]) pulmologist--  dr Elsworth Soho    advanded emphysema per CT   . Skin changes related to chemotherapy     ARMS  . 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 CKD (chronic kidney disease), stage III     PSH:     Past Surgical History  Procedure Laterality Date  . Laparoscopic nissen fundoplication  [id number]  . Video bronchoscopy Bilateral 01/16/2013    Procedure: VIDEO BRONCHOSCOPY WITHOUT FLUORO;  Surgeon: [name], MD;  Location: [facility];  Service: Cardiopulmonary;  Laterality: Bilateral;  . Simple mastectomy with axillary sentinel node biopsy Bilateral 04/02/2014    Procedure: LEFT TOTAL MASTECTOMY  WITH LEFT AXILLARY SENTINEL NODE BIOPSY, RIGHT PROPHYLACTIC MASTETCTOMY;  Surgeon: [name], MD;  Location: [place];  Service: General;  Laterality: Bilateral;  . Portacath placement N/A 04/02/2014    Procedure: INSERTION PORT-A-CATH;  Surgeon: [name], MD;  Location: [place];  Service: General;  Laterality: N/A;  . Left heart catheterization with coronary angiogram N/A 12/26/2012    Procedure: LEFT HEART CATHETERIZATION WITH CORONARY ANGIOGRAM;  Surgeon: Lorretta Harp, MD;  Location: [facility] CATH LAB;  Service: Cardiovascular;  Laterality: N/A;   Nonobstructive CAD/  40-50% ostial D1/  normal LVF, ef 60%  . Breast reconstruction with placement of tissue expander and flex hd (acellular hydrated dermis) Bilateral 09/25/2014    Procedure: PLACEMENT OF BILATERAL TISSUE EXPANDER AND  ACELLULAR DERMIS FOR BREAST RECONSTRUCTION ;  Surgeon: [name], MD;  Location: [place];  Service: Plastics;  Laterality: Bilateral;  . Esophagogastroduodenoscopy (egd) with propofol  01/18/2013  . Removal of bilateral tissue expanders with placement of bilateral breast implants Bilateral 12/18/2014    Procedure: REMOVAL OF BILATERAL TISSUE EXPANDERS,PLACEMENT SILICONE IMPLANTS ;  Surgeon: [name], MD;  Location: [place];  Service: Plastics;  Laterality: Bilateral;  . Liposuction with lipofilling Bilateral 12/18/2014    Procedure: LIPOFILLING TO BILATERAL CHEST;  Surgeon: [name], MD;  Location: [place];  Service: Plastics;  Laterality: Bilateral;  . Transthoracic echocardiogram  02-04-2015    grade 1 diastolic dysfunction, ef [id number]  trivial TR  . Appendectomy  1992  . Cholecystectomy  1999  . Total abdominal hysterectomy w/ bilateral salpingoophorectomy  1996  . Port-a-cath removal Right 06/28/2015    Procedure: REMOVAL PORT-A-CATH;  Surgeon: Leighton Ruff, MD;  Location: [facility];  Service: General;  Laterality: Right;     POb/GynH:      OB History    No data available      SH:   Social History  Substance Use Topics  . Smoking status: Former Smoker -- 0.00 packs/day for 37 years    Types: Cigarettes, E-cigarettes    Quit date: 03/29/2014  . Smokeless tobacco: Never Used     Comment: uses vapor occasionally  . Alcohol Use: No    FH:    Family History  Problem Relation Age of Onset  . Heart failure Mother   . Colon cancer Father 39    stomach cancer also in 15s  . Throat cancer Brother 79    smoker  . Heart attack Maternal Grandmother   . Colon cancer Paternal Grandmother 28  . Cancer Paternal Grandfather     kidney and bladder  . Throat cancer Brother 37    throat cancer, smoker  . Ovarian cancer Sister 49    ovarian cancer at 61, colorectal cancer at 32  . Breast cancer Paternal Aunt 11  . Ovarian cancer Other 67    niece with ovarian cancer     Allergies:  Allergies  Allergen Reactions  . Contrast Media [Iodinated Diagnostic Agents] Anaphylaxis  . Prednisone Shortness Of Breath and Swelling    CAN TOLERATE IF GIVEN BENADRYL PRIOR     Medications:       Current facility-administered medications:  .  ceFAZolin (ANCEF) IVPB 2 g/50 mL premix, 2 g, Intravenous, On Call to OR, [name], MD .  lactated ringers infusion, , Intravenous, Continuous, Lerry Liner, MD, Last Rate: 75 mL/hr at 07/17/15 [phone] .  midazolam (VERSED) injection 1-2 mg, 1-2 mg, Intravenous, Q5 min PRN, Lerry Liner, MD, 2 mg at 07/17/15 [id number]  Review of Systems:   Review of Systems  Constitutional: Negative for fever, chills, weight loss, malaise/fatigue and diaphoresis.  HENT: Negative for hearing loss, ear pain, nosebleeds, congestion, sore throat, neck pain, tinnitus and ear discharge.   Eyes: Negative for blurred vision, double vision, photophobia, pain, discharge and redness.  Respiratory: Negative for cough, hemoptysis, sputum production, shortness of breath, wheezing and stridor.    Cardiovascular: Negative for chest pain, palpitations, orthopnea, claudication, leg swelling and PND.  Gastrointestinal: Positive for abdominal pain. Negative for heartburn, nausea, vomiting, diarrhea, constipation, blood in stool and melena.  Genitourinary: Negative for dysuria, urgency, frequency, hematuria and flank pain.  Musculoskeletal: Negative for myalgias, back pain, joint pain and falls.  Skin: Negative for itching and rash.  Neurological: Negative for dizziness, tingling, tremors, sensory change, speech change, focal weakness, seizures, loss of consciousness, weakness and headaches.  Endo/Heme/Allergies: Negative for environmental allergies and polydipsia. Does not bruise/bleed easily.  Psychiatric/Behavioral: Negative for depression, suicidal ideas, hallucinations, memory loss and substance abuse. The patient is not nervous/anxious and does not have insomnia.      PHYSICAL EXAM:  Blood pressure 99/68, temperature 98.1 F (36.7 C), temperature source Oral, resp. rate 15, SpO2 99 %.    Vitals reviewed. Constitutional: She is oriented to person, place, and time. She appears well-developed and well-nourished.  HENT:  Head: Normocephalic and atraumatic.  Right Ear: External ear normal.  Left Ear: External ear normal.  Nose: Nose normal.  Mouth/Throat: Oropharynx is clear and moist.  Eyes: Conjunctivae and EOM are normal. Pupils are equal, round, and reactive to light. Right eye exhibits no discharge. Left eye exhibits no discharge. No scleral icterus.  Neck: Normal range of motion. Neck supple. No tracheal deviation present. No thyromegaly present.  Cardiovascular: Normal rate, regular rhythm, normal heart sounds and intact distal pulses.  Exam reveals no gallop and no friction rub.   No murmur heard. Respiratory: Effort normal and breath sounds normal. No respiratory distress. She has no wheezes. She has no rales. She exhibits no tenderness.  GI: Soft. Bowel sounds are normal.  She exhibits no distension and no mass. There is tenderness. There is no rebound and no guarding.  Genitourinary:       Vulva is normal without lesions. Vagina is pink moist without discharge  Small area of indurated tissue 4 cm into vagina just right of the midline Cervix absent Uterus is uterus absent Adnexa is negative with normal sized ovaries by sonogram  Musculoskeletal: Normal range of motion. She exhibits no edema and no tenderness.  Neurological: She is alert and oriented to person, place, and time. She has normal reflexes. She displays normal reflexes. No cranial nerve deficit. She exhibits normal muscle tone. Coordination normal.  Skin: Skin is warm and dry. No rash noted. No erythema. No pallor.  Psychiatric: She has a normal mood and affect. Her behavior is normal. Judgment and thought content normal.  Labs: Results for orders placed or performed during the hospital encounter of 07/11/15 (from the past 336 hour(s))  Type and screen   Collection Time: 07/11/15  3:30 PM  Result Value Ref Range   ABO/RH(D) O POS    Antibody Screen NEG    Sample Expiration 07/25/2015    Extend sample reason NO TRANSFUSIONS OR PREGNANCY IN THE PAST 3 MONTHS   CBC   Collection Time: 07/11/15  4:00 PM  Result Value Ref Range   WBC 4.2 4.0 - 10.5 K/uL   RBC 3.47 (L) 3.87 - 5.11 MIL/uL   Hemoglobin 10.8 (L) 12.0 - 15.0 g/dL   HCT 32.0 (L) 36.0 - 46.0 %   MCV 92.2 78.0 - 100.0 fL   MCH 31.1 26.0 - 34.0 pg   MCHC 33.8 30.0 - 36.0 g/dL   RDW 14.1 11.5 - 15.5 %   Platelets 233 150 - 400 K/uL  Comprehensive metabolic panel   Collection Time: 07/11/15  4:00 PM  Result Value Ref Range   Sodium 140 135 - 145 mmol/L   Potassium 3.9 3.5 - 5.1 mmol/L   Chloride 109 101 - 111 mmol/L   CO2 24 22 - 32 mmol/L   Glucose, Bld 89 65 - 99 mg/dL   BUN 15 6 - 20 mg/dL   Creatinine, Ser 1.19 (H) 0.44 - 1.00 mg/dL   Calcium 9.0 8.9 - 10.3 mg/dL   Total Protein 7.0 6.5 - 8.1 g/dL   Albumin 4.3 3.5 - 5.0  g/dL   AST 18 15 - 41 U/L   ALT 17 14 - 54 U/L   Alkaline Phosphatase 118 38 - 126 U/L   Total Bilirubin 0.6 0.3 - 1.2 mg/dL   GFR calc non Af Amer 51 (L) >60 mL/min   GFR calc Af Amer 59 (L) >60 mL/min   Anion gap 7 5 - 15    EKG: Orders placed or performed during the hospital encounter of 02/18/15  . ED EKG within 10 minutes  . ED EKG within 10 minutes  . EKG 12-Lead  . EKG 12-Lead  . EKG    Imaging Studies: Nm Pet Image Restag (ps) Skull Base To Thigh  07/02/2015  CLINICAL DATA:  Subsequent treatment strategy for breast cancer. EXAM: NUCLEAR MEDICINE PET SKULL BASE TO THIGH TECHNIQUE: 7.9 mCi F-18 FDG was injected intravenously. Full-ring PET imaging was performed from the skull base to thigh after the radiotracer. CT data was obtained and used for attenuation correction and anatomic localization. FASTING BLOOD GLUCOSE:  Value: 89 mg/dl COMPARISON:  03/12/2014 FINDINGS: NECK No hypermetabolic lymph nodes in the neck. CHEST No hypermetabolic mediastinal or hilar nodes. No suspicious pulmonary nodules on the CT scan. Subcutaneous fat stranding and increased uptake within the ventral chest wall on the right measures 1.8 cm and has an SUV max equal to 5.27. This may be related to previous Port-A-Cath cavity. Mild increased uptake within right axillary lymph nodes. Index right axillary lymph node measures 5 mm and has an SUV max equal to 1.8, image number 58 of series 4. Aortic atherosclerosis. Calcification within the LAD and left circumflex coronary artery. No hypermetabolic mediastinal or hilar lymph nodes identified. There is no pleural fluid identified. Moderate to advanced changes of centrilobular and paraseptal emphysema. No hypermetabolic pulmonary nodule identified. ABDOMEN/PELVIS No abnormal hypermetabolic activity within the liver, pancreas, adrenal glands, or spleen. Aortic atherosclerosis. No hypermetabolic lymph nodes in the abdomen or pelvis. SKELETON No focal hypermetabolic  activity to suggest skeletal metastasis.  IMPRESSION: 1. No specific findings identified to suggest residual or recurrence of tumor. 2. Increased uptake within right chest wall corresponding to site of Port-A-Cath removal. 3. Aortic atherosclerosis and multi vessel coronary artery calcification. Electronically Signed   By: Kerby Moors M.D.   On: 07/02/2015 08:43      Assessment: Rectovaginal fistula: spontaneous S/P a planned repair attempt by Dr Marcello Moores, unable to isolate and no repair was done    Patient Active Problem List   Diagnosis Date Noted  . Genetic testing 06/03/2015  . Pain in joint, pelvic region and thigh 04/30/2015  . Brain aneurysm 04/25/2015  . Dehydration 04/22/2015  . Back pain 04/19/2015  . Renal insufficiency 04/19/2015  . Lung nodule 12/11/2014  . Ankle edema 10/02/2014  . Hospital-acquired pneumonia 09/28/2014  . Acquired absence of breast and nipple 09/25/2014  . Neuropathy due to chemotherapeutic drug ([place]) 08/07/2014  . Chemotherapy induced neutropenia ([place]) 08/07/2014  . Insomnia 07/31/2014  . 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 Atherosclerosis 07/17/2014  . Irregular bowel habits 06/27/2014  . Mucositis due to chemotherapy 06/05/2014  . Diarrhea 05/29/2014  . Pain at surgical site 05/07/2014  . Family history of malignant neoplasm of gastrointestinal tract 03/07/2014  . Family history of malignant neoplasm of breast 03/07/2014  . Family history of malignant neoplasm of ovary 03/07/2014  . Breast cancer of upper-outer quadrant of left female breast ([place]) 02/26/2014  . CAD (coronary artery disease) 04/16/2013  . Intractable migraine without aura 02/16/2013  . COPD exacerbation ([place]) 02/01/2013  . S/P Nissen fundoplication (without gastrostomy tube) procedure 01/05/2013  . COPD (chronic obstructive pulmonary disease) ([place]) 01/03/2013  . Chest pain 12/25/2012  . Hypothyroidism 12/25/2012  . Tobacco abuse 12/25/2012    Plan: Repair of  rectovaginal fistula  EURE,LUTHER H 07/17/2015 10:04 AM

## 2015-07-17 NOTE — Anesthesia Preprocedure Evaluation (Signed)
Anesthesia Evaluation  Patient identified by MRN, date of birth, ID band Patient awake    Reviewed: Allergy & Precautions, NPO status , Patient's Chart, lab work & pertinent test results  History of Anesthesia Complications Negative for: history of anesthetic complications  Airway Mallampati: II  TM Distance: >3 FB Neck ROM: Full    Dental  (+) Dental Advisory Given, Edentulous Upper   Pulmonary pneumonia, COPD, former smoker,    Pulmonary exam normal        Cardiovascular (-) angina+ CAD and + Peripheral Vascular Disease  Normal cardiovascular exam     Neuro/Psych  Headaches, Anxiety  Neuromuscular disease    GI/Hepatic Neg liver ROS, GERD (resolved after Nissen)  ,  Endo/Other  Hypothyroidism   Renal/GU negative Renal ROS     Musculoskeletal   Abdominal   Peds  Hematology  (+) anemia ,   Anesthesia Other Findings   Reproductive/Obstetrics                             Anesthesia Physical Anesthesia Plan  ASA: III  Anesthesia Plan: General   Post-op Pain Management:    Induction: Intravenous  Airway Management Planned: Oral ETT  Additional Equipment:   Intra-op Plan:   Post-operative Plan: Extubation in OR  Informed Consent: I have reviewed the patients History and Physical, chart, labs and discussed the procedure including the risks, benefits and alternatives for the proposed anesthesia with the patient or authorized representative who has indicated his/her understanding and acceptance.     Plan Discussed with:   Anesthesia Plan Comments:         Anesthesia Quick Evaluation

## 2015-07-17 NOTE — Discharge Instructions (Signed)
Episiotomy, Care After Refer to this sheet in the next few weeks. These instructions provide you with information on caring for yourself after your procedure. Your health care provider may also give you more specific instructions. Your treatment has been planned according to current medical practices, but problems sometimes occur. Call your health care provider if you have any problems or questions after your procedure. WHAT TO EXPECT AFTER THE PROCEDURE After your procedure, it is typical to have the following sensations:  Pain or aching around the episiotomy site.  Redness and mild swelling around the episiotomy site.  A tugging or pulling sensation at the episiotomy site from the stitches. HOME CARE INSTRUCTIONS   The first day, put ice on the episiotomy area.  Put ice in a plastic bag.  Place a towel between your skin and the bag.  Leave the ice on for 20 minutes, 2-3 times a day.  Bathe using a warm sitz bath as directed by your health care provider. This can speed up healing. Sitz baths can be performed in your bathtub or using a sitz bath kit that fits over your toilet.  Place 3-4 inches of warm water in your bathtub or fill the sitz bath over-the-toilet container with warm water. Make sure the water is not too hot by placing a drop on your wrist.  Sit in the warm water for 20-30 minutes.  After bathing, pat your perineum dry with a clean towel. Do not scrub the perineum as this could cause pain, irritation, or open any stitches you may have.  Keep the over-the-toilet sitz bath container clean by rinsing it thoroughly after each use. Ask for help in keeping the bathtub clean with diluted bleach and water (2 tablespoons of bleach to one half gallon of water).  Repeat the sitz bath as often as you would like to relieve perineal pain, itching, or discomfort.  Apply a numbing spray to the episiotomy site as directed by your health care provider. This may help with  discomfort.  Wash your hands before and after applying medicine to the episiotomy area.  Put about 3 witch hazel-containing hemorrhoid treatment pads on top of your sanitary pad. The witch hazel in the hemorrhoid pads helps with discomfort and swelling.  Get a peri-bottle to squeeze warm water on your perineum when urinating, spraying the area from front to back. Pat the area to dry.  Sitting on an inflatable ring or pillow may provide comfort.  Only take over-the-counter or prescription medicines for pain, discomfort, or fever as directed by your health care provider.  Do not have sexual intercourse or use tampons until your health care provider says it is okay. Typically, you must wait at least 6 weeks.  Keep all postpartum appointments. SEEK MEDICAL CARE IF:   Your pain is not relieved with medicines.  You have painful urination.  You have a fever. SEEK IMMEDIATE MEDICAL CARE IF:   You have redness, swelling, or increasing pain in the episiotomy area.  You have pus coming from the episiotomy area.  You notice a bad smell coming from the episiotomy area.  Your episiotomy opens.  You notice swelling in the episiotomy area that is larger than when you left the hospital.  You cannot urinate.   This information is not intended to replace advice given to you by your health care provider. Make sure you discuss any questions you have with your health care provider.   Document Released: 07/06/2005 Document Revised: 07/27/2014 Document Reviewed: 04/11/2013 Elsevier Interactive  Patient Education 2016 [employer].

## 2015-07-17 NOTE — OR Nursing (Signed)
Report given to Charm Barges  RN

## 2015-07-17 NOTE — Transfer of Care (Signed)
Immediate Anesthesia Transfer of Care Note  Patient: Stacy Fernandez  Procedure(s) Performed: Procedure(s): REPAIR OF RECTOVAGINAL FISTULA (N/A)  Patient Location: PACU  Anesthesia Type:General  Level of Consciousness: awake, oriented and patient cooperative  Airway & Oxygen Therapy: Patient Spontanous Breathing and Patient connected to face mask oxygen  Post-op Assessment: Report given to RN and Post -op Vital signs reviewed and stable  Post vital signs: Reviewed and stable  Last Vitals:  Filed Vitals:   07/17/15 1030 07/17/15 1035  BP: 93/52 93/51  Temp:    Resp: 15 19    Complications: No apparent anesthesia complications

## 2015-07-17 NOTE — Anesthesia Procedure Notes (Signed)
Procedure Name: Intubation Date/Time: 07/17/2015 10:45 AM Performed by: Andree Elk, AMY A Pre-anesthesia Checklist: Patient identified, Patient being monitored, Timeout performed, Emergency Drugs available and Suction available Patient Re-evaluated:Patient Re-evaluated prior to inductionOxygen Delivery Method: Circle System Utilized Preoxygenation: Pre-oxygenation with 100% oxygen Intubation Type: IV induction Ventilation: Mask ventilation without difficulty Laryngoscope Size: 3 and Miller Grade View: Grade I Tube type: Oral Tube size: 7.0 mm Number of attempts: 1 Airway Equipment and Method: Stylet Placement Confirmation: ETT inserted through vocal cords under direct vision,  positive ETCO2 and breath sounds checked- equal and bilateral Secured at: 21 cm Tube secured with: Tape Dental Injury: Teeth and Oropharynx as per pre-operative assessment

## 2015-07-17 NOTE — Op Note (Signed)
Preoperative diagnosis:  Rectovaginal fistula, spontaneous                                         No evidence of inflammatory bowel disease and diverticular abscess or other primary                                           infectious process  Postoperative diagnosis:  Same as above  Procedure: Repair of rectovaginal fistula  Anesthesia: Gen. Endotracheal  Findings:  Patient came to see me originally on October 13 with a two-week history of fecal soiling and air per vagina.  The patient did not have a history of diverticulitis or diverticular abscess.  She had a colonoscopy in 2015 into small adenomatous polyps were removed.  I could feel indurated tissue just to the right of the midline and the vaginal cuff side of the rectal sphincter.  There was no collection of debris in the rectovaginal space that I could appreciate.  I reviewed his CT scan which revealed a definitive rectovaginal fistula but no diverticular abscess or evidence of inflammatory bowel disease .  I  Send the patient to  Dr. Leighton Ruff for evaluation and surgical management.  She was taken to the operating room and underwent anorectal scoping and no lesion was found and then she attempted to delineate the fistula using contrast media but was unsuccessful.  As result the patient came back to me continuing to pass liquidy yellow material and a air from the vagina Told the patient my approach would be a vaginal 1 where I would essentially re-create a third or fourth degree episiotomy dissecting out the indurated tissue.  I told her nurse husband more than likely I would not be able to identify the fistula itself but would get beyond the area of induration dissected out close the rectum back and then do multiple layers between the rectum and vagina.  They were comfortable with this approach  Today at the time of surgery it was noted she had essentially no perineal body there was little tobetween the vagina and the rectum itself.   There was no evidence of any fecal material either in the vagina or in the rectovaginal space.  Overall the tissue was relatively free of significant inflammatory changes.  I dissected the vagina all the way to the top of the cuff where she had her's closure from her hysterectomy.  No definitive fistula was identified but minimal indurated tissue was in all this was dissected away  Description of operation:  Patient was taken to the operating room placed in the supine position where she underwent general endotracheal anesthesia. She was then placed in lithotomy position prepped and draped in usual sterile fashion and her bladder was drained  I did a manual palpation of the vagina rectovaginal septum and the rectum could feel the indurated tissue just cephalad to the rectal sphincter.  I opened the vagina in the midline using a 10 blade and dissected the rectovaginal septum.  There was essentially no intervening tissue in the rectovaginal space no perineal body to speak of I'm not sure if this was a primary or secondary problem to this process.  I cut the anal sphincter rectal sphincter in the midline and opened up the rectum also in  the midline I was unable to identify the fistula itself. The dissection was taken all the way to the top of the vaginal cuff. I dissected the rectum all the sidewalls ring up a good amount of tissue and to reduce postoperative tension. I then closed the vagina with running 3-0 Monocryl just getting the mucosa itself but not through and through. There was no suture material in the rectal lumen palpable at the end of the procedure.  This was closed in a running fashion.  I then did 3 layers of imbricating tissue down the entire length of the rectal closure building up a significant rectovaginal space and perineal body.  The rectal sphincter was closed in interrupted fashion at 12 3 6:00 respectively.  There was good integrity of the sphincter in the rectal mucosa and a  significant improvement in the tissue and layering of the rectovaginal space.  I then closed the vagina with interrupted 3-0 Vicryl suture.  Did interrupted closure of the deep perineal body musculature.  The perineal skin was closed with interrupted subcuticular sutures.    In the middle and at the end of the procedure I palpated the rectal mucosa rectally and again there was no suture through and through the mucosa closure throughout the entire length of the defect created.  The vagina and rectovaginal septum likewise were much more substantial and closed.  The patient's parents about 100 mL of blood loss and was taken to recovery in good stable condition WERE correct  The patient has been in Cipro and Flagyl of time preoperatively and received Ancef for the surgery.  She will continue on Cipro and Flagyl for the next 2 weeks to hopefully allow this area to heal effectively  She'll be seen back in the office in one week  [name], MD 07/17/2015 12:23 PM

## 2015-07-18 ENCOUNTER — Encounter (HOSPITAL_COMMUNITY): Payer: Self-pay | Admitting: Obstetrics & Gynecology

## 2015-07-18 DIAGNOSIS — N823 Fistula of vagina to large intestine: Secondary | ICD-10-CM | POA: Diagnosis not present

## 2015-07-18 MED ORDER — ONDANSETRON HCL 8 MG PO TABS
8.0000 mg | ORAL_TABLET | Freq: Four times a day (QID) | ORAL | Status: DC | PRN
Start: 2015-07-18 — End: 2015-08-20

## 2015-07-18 MED ORDER — SENNOSIDES-DOCUSATE SODIUM 8.6-50 MG PO TABS: 1.0000 | ORAL_TABLET | Freq: Every evening | ORAL | Status: DC | PRN

## 2015-07-18 NOTE — Progress Notes (Signed)
Discharged instructions given by Richarda Osmond RN, patient verbalized understanding. Vital signs stable. Accompanied by staff to an awaiting vehicle.

## 2015-07-18 NOTE — Discharge Summary (Signed)
Physician Discharge Summary  Patient ID: Stacy Fernandez MRN: [id number] DOB/AGE: [birth year] 54 y.o.  Admit date: 07/17/2015 Discharge date: 07/18/2015  Admission Diagnoses: RV fistula  Discharge Diagnoses:  Active Problems:   Rectovaginal fistula   Surgery follow-up   Discharged Condition: good  Hospital Course: pt had to be admitted unexpectedly for post op pain management She did well overnight and is tolerating oral pain meds for her pain  Consults: None  Significant Diagnostic Studies:   Treatments: surgery: repair of RV fistula  Discharge Exam: Blood pressure 94/61, pulse 85, temperature 98.6 F (37 C), temperature source Oral, resp. rate 12, height 5\' 6"  (1.676 m), weight 161 lb (73.029 kg), SpO2 93 %. General appearance: alert, cooperative and no distress Incision/Wound:no bruising ro swelling  Disposition: 01-Home or Self Care  Discharge Instructions    Call MD for:  persistant nausea and vomiting    Complete by:  As directed      Call MD for:  severe uncontrolled pain    Complete by:  As directed      Call MD for:  temperature >100.4    Complete by:  As directed      Diet - low sodium heart healthy    Complete by:  As directed      Driving Restrictions    Complete by:  As directed   No driving for 1 week     Increase activity slowly    Complete by:  As directed      Lifting restrictions    Complete by:  As directed   No more than 5 pounds for 6 weeks at least     No wound care    Complete by:  As directed      Sexual Activity Restrictions    Complete by:  As directed   No sex until released            Medication List    TAKE these medications        buPROPion 300 MG 24 hr tablet  Commonly known as:  WELLBUTRIN XL  Take 300 mg by mouth every morning. Reported on 07/08/2015     ciprofloxacin 500 MG tablet  Commonly known as:  CIPRO  Take 1 tablet (500 mg total) by mouth 2 (two) times daily.     ciprofloxacin 500 MG tablet  Commonly  known as:  CIPRO  Take 1 tablet (500 mg total) by mouth 2 (two) times daily.     Diclofenac Potassium 50 MG Pack  Commonly known as:  CAMBIA  Take at onset of  migraine.  May repeat in 2 hours, if needed.  Max: 2 packs in 24 hours.     DUONEB 0.5-2.5 (3) MG/3ML Soln  Generic drug:  ipratropium-albuterol  Inhale 3 mLs into the lungs every 6 (six) hours as needed (for shortness of breath). Reported on 07/08/2015     fluticasone 50 MCG/ACT nasal spray  Commonly known as:  FLONASE  Place 1 spray into both nostrils daily as needed for allergies or rhinitis.     furosemide 20 MG tablet  Commonly known as:  LASIX  Take 0.5 tablets (10 mg total) by mouth every morning.     gabapentin 300 MG capsule  Commonly known as:  NEURONTIN  TAKE 1 CAPSULE (300 MG TOTAL) BY MOUTH 4 (FOUR) TIMES DAILY.---  CURRENTLY PT TAKES ONLY IN PM     Garcinia Cambogia-Chromium 500-200 MG-MCG Tabs  Take 1 tablet by mouth 2 (two) times  daily. Reported on 07/08/2015     levothyroxine 125 MCG tablet  Commonly known as:  SYNTHROID, LEVOTHROID  Take 125 mcg by mouth daily before breakfast.     metroNIDAZOLE 500 MG tablet  Commonly known as:  FLAGYL  Take 1 tablet (500 mg total) by mouth 2 (two) times daily.     metroNIDAZOLE 500 MG tablet  Commonly known as:  FLAGYL  Take 1 tablet (500 mg total) by mouth 2 (two) times daily.     montelukast 10 MG tablet  Commonly known as:  SINGULAIR  Take 10 mg by mouth at bedtime.     ondansetron 8 MG tablet  Commonly known as:  ZOFRAN  Take 1 tablet (8 mg total) by mouth every 6 (six) hours as needed for nausea.     oxyCODONE-acetaminophen 5-325 MG tablet  Commonly known as:  ROXICET  Take 1-2 tablets by mouth every 4 (four) hours as needed for severe pain.     potassium chloride SA 20 MEQ tablet  Commonly known as:  KLOR-CON M20  TAKE 1 TABLET (20 MEQ TOTAL) BY MOUTH ONCE DAY--- TAKES IN AM     PROAIR HFA 108 (90 Base) MCG/ACT inhaler  Generic drug:  albuterol   Inhale 2 puffs into the lungs every 4 (four) hours as needed for wheezing or shortness of breath. Reported on 07/08/2015     rizatriptan 10 MG disintegrating tablet  Commonly known as:  MAXALT-MLT  Take 10 mg by mouth as needed for migraine. May repeat in 2 hours if needed     senna-docusate 8.6-50 MG tablet  Commonly known as:  Senokot-S  Take 1 tablet by mouth at bedtime as needed for mild constipation.     SUMAtriptan 6 MG/0.5ML Soln injection  Commonly known as:  IMITREX  Inject 0.5 mLs (6 mg total) into the skin every 2 (two) hours as needed for migraine or headache. May repeat in 2 hours if headache persists or recurs.     traMADol 50 MG tablet  Commonly known as:  ULTRAM  Take 1-2 tablets (50-100 mg total) by mouth every 6 (six) hours as needed.     traZODone 100 MG tablet  Commonly known as:  DESYREL  TAKE 1 TABLET (100 MG TOTAL) BY MOUTH AT BEDTIME.     verapamil 120 MG CR tablet  Commonly known as:  CALAN-SR  TAKE 1 TABLET BY MOUTH AT BEDTIME ( MAX 30 DAY SUPPLY PER INSURANCE)  --- migraine prevention           Follow-up Information    Follow up with [name], MD In 1 week.   Specialties:  Obstetrics and Gynecology, Radiology   Contact information:   [place] [address] [phone]       Signed: [name] 07/18/2015, 9:37 AM

## 2015-07-26 ENCOUNTER — Telehealth: Payer: Self-pay | Admitting: Obstetrics & Gynecology

## 2015-07-26 ENCOUNTER — Ambulatory Visit (INDEPENDENT_AMBULATORY_CARE_PROVIDER_SITE_OTHER): Payer: BLUE CROSS/BLUE SHIELD | Admitting: Obstetrics & Gynecology

## 2015-07-26 ENCOUNTER — Encounter: Payer: Self-pay | Admitting: Obstetrics & Gynecology

## 2015-07-26 VITALS — BP 98/58 | HR 66 | Wt 162.0 lb

## 2015-07-26 DIAGNOSIS — Z9889 Other specified postprocedural states: Secondary | ICD-10-CM

## 2015-07-26 MED ORDER — OXYCODONE-ACETAMINOPHEN 5-325 MG PO TABS: 1.0000 | ORAL_TABLET | ORAL | Status: DC | PRN

## 2015-07-26 MED ORDER — FLUCONAZOLE 100 MG PO TABS: 100.0000 mg | ORAL_TABLET | Freq: Every day | ORAL | Status: DC

## 2015-07-26 MED ORDER — BENZOCAINE-MENTHOL 20-0.5 % EX AERO: 1.0000 "application " | INHALATION_SPRAY | Freq: Four times a day (QID) | CUTANEOUS | Status: DC | PRN

## 2015-07-26 NOTE — Progress Notes (Signed)
Patient ID: Stacy Fernandez, female   DOB: 1960/11/22, 55 y.o.   MRN: [id number]  HPI: Patient returns for routine postoperative follow-up having undergone repair of a rectovaginal fistula on 07/17/2015.  The patient's immediate postoperative recovery has been unremarkable. Since hospital discharge the patient reports pain improving. No episodes of air or fecal loss   Current Outpatient Prescriptions: buPROPion (WELLBUTRIN XL) 300 MG 24 hr tablet, Take 300 mg by mouth every morning. Reported on 07/08/2015, Disp: , Rfl:  ciprofloxacin (CIPRO) 500 MG tablet, Take 1 tablet (500 mg total) by mouth 2 (two) times daily., Disp: 20 tablet, Rfl: 0 Diclofenac Potassium (CAMBIA) 50 MG PACK, Take at onset of  migraine.  May repeat in 2 hours, if needed.  Max: 2 packs in 24 hours., Disp: 9 each, Rfl: 5 DUONEB 0.5-2.5 (3) MG/3ML SOLN, Inhale 3 mLs into the lungs every 6 (six) hours as needed (for shortness of breath). Reported on 07/08/2015, Disp: , Rfl: 3 fluticasone (FLONASE) 50 MCG/ACT nasal spray, Place 1 spray into both nostrils daily as needed for allergies or rhinitis., Disp: , Rfl:  furosemide (LASIX) 20 MG tablet, Take 0.5 tablets (10 mg total) by mouth every morning., Disp: 30 tablet, Rfl: 0 Garcinia Cambogia-Chromium 500-200 MG-MCG TABS, Take 1 tablet by mouth 2 (two) times daily. Reported on 07/08/2015, Disp: , Rfl:  levothyroxine (SYNTHROID, LEVOTHROID) 125 MCG tablet, Take 125 mcg by mouth daily before breakfast., Disp: , Rfl:  metroNIDAZOLE (FLAGYL) 500 MG tablet, Take 1 tablet (500 mg total) by mouth 2 (two) times daily., Disp: 20 tablet, Rfl: 0 montelukast (SINGULAIR) 10 MG tablet, Take 10 mg by mouth at bedtime. , Disp: , Rfl:  ondansetron (ZOFRAN) 8 MG tablet, Take 1 tablet (8 mg total) by mouth every 6 (six) hours as needed for nausea., Disp: 20 tablet, Rfl: 0 oxyCODONE-acetaminophen (ROXICET) 5-325 MG tablet, Take 1-2 tablets by mouth every 4 (four) hours as needed for severe pain., Disp:  30 tablet, Rfl: 0 potassium chloride SA (KLOR-CON M20) 20 MEQ tablet, TAKE 1 TABLET (20 MEQ TOTAL) BY MOUTH ONCE DAY--- TAKES IN AM, Disp: 30 tablet, Rfl: 1 PROAIR HFA 108 (90 BASE) MCG/ACT inhaler, Inhale 2 puffs into the lungs every 4 (four) hours as needed for wheezing or shortness of breath. Reported on 07/08/2015, Disp: , Rfl:  rizatriptan (MAXALT-MLT) 10 MG disintegrating tablet, Take 10 mg by mouth as needed for migraine. May repeat in 2 hours if needed, Disp: , Rfl:  senna-docusate (SENOKOT-S) 8.6-50 MG tablet, Take 1 tablet by mouth at bedtime as needed for mild constipation., Disp: 30 tablet, Rfl: 1 SUMAtriptan (IMITREX) 6 MG/0.5ML SOLN injection, Inject 0.5 mLs (6 mg total) into the skin every 2 (two) hours as needed for migraine or headache. May repeat in 2 hours if headache persists or recurs., Disp: 12 vial, Rfl: 6 traZODone (DESYREL) 100 MG tablet, TAKE 1 TABLET (100 MG TOTAL) BY MOUTH AT BEDTIME., Disp: 30 tablet, Rfl: 1 verapamil (CALAN-SR) 120 MG CR tablet, TAKE 1 TABLET BY MOUTH AT BEDTIME ( MAX 30 DAY SUPPLY PER INSURANCE)  --- migraine prevention, Disp: 30 tablet, Rfl: 6 gabapentin (NEURONTIN) 300 MG capsule, TAKE 1 CAPSULE (300 MG TOTAL) BY MOUTH 4 (FOUR) TIMES DAILY.---  CURRENTLY PT TAKES ONLY IN PM (Patient taking differently: Take 300 mg by mouth daily. ), Disp: 120 capsule, Rfl: 3 traMADol (ULTRAM) 50 MG tablet, Take 1-2 tablets (50-100 mg total) by mouth every 6 (six) hours as needed. (Patient not taking: Reported on 07/26/2015), Disp: 20 tablet,  Rfl: 0  No current facility-administered medications for this visit.    Blood pressure 98/58, pulse 66, weight 162 lb (73.483 kg).  Physical Exam: No swelling erythema Some yeast changes likely from the antibiotics  Diagnostic Tests: none  Pathology:   Impression: S/p RV fistula repair  Plan:   Follow up: 4  weeks  [name], MD

## 2015-07-29 ENCOUNTER — Encounter: Payer: BLUE CROSS/BLUE SHIELD | Admitting: Obstetrics & Gynecology

## 2015-07-30 ENCOUNTER — Telehealth: Payer: Self-pay | Admitting: Obstetrics & Gynecology

## 2015-07-30 ENCOUNTER — Telehealth: Payer: Self-pay | Admitting: *Deleted

## 2015-07-30 ENCOUNTER — Telehealth: Payer: Self-pay | Admitting: Neurology

## 2015-07-30 ENCOUNTER — Ambulatory Visit: Payer: BLUE CROSS/BLUE SHIELD | Admitting: Neurology

## 2015-07-30 MED ORDER — POLYETHYLENE GLYCOL 3350 17 GM/SCOOP PO POWD: ORAL | Status: DC

## 2015-07-30 NOTE — Telephone Encounter (Signed)
Spoke with the patients husband. Called him to inform him that the patients medication had not arrived for her apt. Today. He stated that she forgot about her apt and would be able to make it regardless. Cancelled apt.

## 2015-08-01 ENCOUNTER — Telehealth: Payer: Self-pay | Admitting: Neurology

## 2015-08-01 NOTE — Telephone Encounter (Signed)
Estill Bamberg with [facility] is calling regarding the patient's Botox. Estill Bamberg states a criteria form was faxed to our office on 07-30-15 and they need it back before the Rx can be filled.

## 2015-08-08 NOTE — Telephone Encounter (Signed)
RX sent

## 2015-08-12 ENCOUNTER — Ambulatory Visit: Payer: BLUE CROSS/BLUE SHIELD | Admitting: Obstetrics & Gynecology

## 2015-08-13 ENCOUNTER — Ambulatory Visit: Payer: BLUE CROSS/BLUE SHIELD | Admitting: Obstetrics & Gynecology

## 2015-08-19 NOTE — Telephone Encounter (Signed)
Noted  

## 2015-08-20 ENCOUNTER — Encounter: Payer: Self-pay | Admitting: Obstetrics & Gynecology

## 2015-08-20 ENCOUNTER — Ambulatory Visit (INDEPENDENT_AMBULATORY_CARE_PROVIDER_SITE_OTHER): Payer: BLUE CROSS/BLUE SHIELD | Admitting: Obstetrics & Gynecology

## 2015-08-20 VITALS — BP 100/60 | HR 88 | Wt 160.0 lb

## 2015-08-20 DIAGNOSIS — Z9889 Other specified postprocedural states: Secondary | ICD-10-CM

## 2015-08-20 NOTE — Progress Notes (Signed)
Patient ID: Stacy Fernandez, female   DOB: May 09, 1961, 55 y.o.   MRN: [id number] Patient ID: Stacy Fernandez, female   DOB: [birth year], 55 y.o.   MRN: [id number]  HPI: Patient returns for routine postoperative follow-up having undergone repair of a rectovaginal fistula on 07/17/2015.  The patient's immediate postoperative recovery has been unremarkable. Since hospital discharge the patient reports pain improving. No episodes of air or fecal loss   Current Outpatient Prescriptions: buPROPion (WELLBUTRIN XL) 300 MG 24 hr tablet, Take 300 mg by mouth every morning. Reported on 07/08/2015, Disp: , Rfl:  ciprofloxacin (CIPRO) 500 MG tablet, Take 1 tablet (500 mg total) by mouth 2 (two) times daily., Disp: 20 tablet, Rfl: 0 Diclofenac Potassium (CAMBIA) 50 MG PACK, Take at onset of  migraine.  May repeat in 2 hours, if needed.  Max: 2 packs in 24 hours., Disp: 9 each, Rfl: 5 DUONEB 0.5-2.5 (3) MG/3ML SOLN, Inhale 3 mLs into the lungs every 6 (six) hours as needed (for shortness of breath). Reported on 07/08/2015, Disp: , Rfl: 3 fluticasone (FLONASE) 50 MCG/ACT nasal spray, Place 1 spray into both nostrils daily as needed for allergies or rhinitis., Disp: , Rfl:  furosemide (LASIX) 20 MG tablet, Take 0.5 tablets (10 mg total) by mouth every morning., Disp: 30 tablet, Rfl: 0 Garcinia Cambogia-Chromium 500-200 MG-MCG TABS, Take 1 tablet by mouth 2 (two) times daily. Reported on 07/08/2015, Disp: , Rfl:  levothyroxine (SYNTHROID, LEVOTHROID) 125 MCG tablet, Take 125 mcg by mouth daily before breakfast., Disp: , Rfl:  metroNIDAZOLE (FLAGYL) 500 MG tablet, Take 1 tablet (500 mg total) by mouth 2 (two) times daily., Disp: 20 tablet, Rfl: 0 montelukast (SINGULAIR) 10 MG tablet, Take 10 mg by mouth at bedtime. , Disp: , Rfl:  ondansetron (ZOFRAN) 8 MG tablet, Take 1 tablet (8 mg total) by mouth every 6 (six) hours as needed for nausea., Disp: 20 tablet, Rfl: 0 oxyCODONE-acetaminophen (ROXICET) 5-325 MG tablet,  Take 1-2 tablets by mouth every 4 (four) hours as needed for severe pain., Disp: 30 tablet, Rfl: 0 potassium chloride SA (KLOR-CON M20) 20 MEQ tablet, TAKE 1 TABLET (20 MEQ TOTAL) BY MOUTH ONCE DAY--- TAKES IN AM, Disp: 30 tablet, Rfl: 1 PROAIR HFA 108 (90 BASE) MCG/ACT inhaler, Inhale 2 puffs into the lungs every 4 (four) hours as needed for wheezing or shortness of breath. Reported on 07/08/2015, Disp: , Rfl:  rizatriptan (MAXALT-MLT) 10 MG disintegrating tablet, Take 10 mg by mouth as needed for migraine. May repeat in 2 hours if needed, Disp: , Rfl:  senna-docusate (SENOKOT-S) 8.6-50 MG tablet, Take 1 tablet by mouth at bedtime as needed for mild constipation., Disp: 30 tablet, Rfl: 1 SUMAtriptan (IMITREX) 6 MG/0.5ML SOLN injection, Inject 0.5 mLs (6 mg total) into the skin every 2 (two) hours as needed for migraine or headache. May repeat in 2 hours if headache persists or recurs., Disp: 12 vial, Rfl: 6 traZODone (DESYREL) 100 MG tablet, TAKE 1 TABLET (100 MG TOTAL) BY MOUTH AT BEDTIME., Disp: 30 tablet, Rfl: 1 verapamil (CALAN-SR) 120 MG CR tablet, TAKE 1 TABLET BY MOUTH AT BEDTIME ( MAX 30 DAY SUPPLY PER INSURANCE)  --- migraine prevention, Disp: 30 tablet, Rfl: 6 gabapentin (NEURONTIN) 300 MG capsule, TAKE 1 CAPSULE (300 MG TOTAL) BY MOUTH 4 (FOUR) TIMES DAILY.---  CURRENTLY PT TAKES ONLY IN PM (Patient taking differently: Take 300 mg by mouth daily. ), Disp: 120 capsule, Rfl: 3 traMADol (ULTRAM) 50 MG tablet, Take 1-2 tablets (50-100 mg total) by  mouth every 6 (six) hours as needed. (Patient not taking: Reported on 07/26/2015), Disp: 20 tablet, Rfl: 0  No current facility-administered medications for this visit.    Blood pressure 98/58, pulse 66, weight 162 lb (73.483 kg).  Physical Exam: No swelling erythema Some yeast changes likely from the antibiotics  Diagnostic Tests: none  Pathology:   Impression: S/p RV fistula repair  Plan:   Follow up: 8  weeks  [name],  MD

## 2015-08-21 ENCOUNTER — Other Ambulatory Visit: Payer: Self-pay | Admitting: Oncology

## 2015-09-12 DIAGNOSIS — Z029 Encounter for administrative examinations, unspecified: Secondary | ICD-10-CM

## 2015-09-23 ENCOUNTER — Other Ambulatory Visit: Payer: Self-pay | Admitting: Nephrology

## 2015-09-23 DIAGNOSIS — N183 Chronic kidney disease, stage 3 unspecified: Secondary | ICD-10-CM

## 2015-09-26 ENCOUNTER — Ambulatory Visit
Admission: RE | Admit: 2015-09-26 | Discharge: 2015-09-26 | Disposition: A | Discharge status: Home / Self Care | Payer: BLUE CROSS/BLUE SHIELD | Source: Ambulatory Visit | Attending: Nephrology | Admitting: Nephrology

## 2015-09-26 DIAGNOSIS — N183 Chronic kidney disease, stage 3 unspecified: Secondary | ICD-10-CM

## 2015-10-11 NOTE — Telephone Encounter (Signed)
Called patient to see if she wanted to continue with the injection process, but she did not respond.

## 2015-10-17 ENCOUNTER — Ambulatory Visit: Payer: BLUE CROSS/BLUE SHIELD | Admitting: Obstetrics & Gynecology

## 2015-10-23 ENCOUNTER — Ambulatory Visit: Payer: BLUE CROSS/BLUE SHIELD | Admitting: Obstetrics & Gynecology

## 2015-10-24 ENCOUNTER — Other Ambulatory Visit: Payer: Self-pay | Admitting: Oncology

## 2015-10-30 ENCOUNTER — Other Ambulatory Visit: Payer: Self-pay | Admitting: *Deleted

## 2015-10-30 DIAGNOSIS — C50412 Malignant neoplasm of upper-outer quadrant of left female breast: Secondary | ICD-10-CM

## 2015-10-31 ENCOUNTER — Other Ambulatory Visit (HOSPITAL_BASED_OUTPATIENT_CLINIC_OR_DEPARTMENT_OTHER): Payer: BLUE CROSS/BLUE SHIELD

## 2015-10-31 ENCOUNTER — Ambulatory Visit (HOSPITAL_BASED_OUTPATIENT_CLINIC_OR_DEPARTMENT_OTHER): Payer: BLUE CROSS/BLUE SHIELD | Admitting: Oncology

## 2015-10-31 ENCOUNTER — Telehealth: Payer: Self-pay | Admitting: Oncology

## 2015-10-31 VITALS — BP 117/70 | HR 89 | Temp 97.9°F | Resp 18 | Ht 66.0 in | Wt 163.7 lb

## 2015-10-31 DIAGNOSIS — Z853 Personal history of malignant neoplasm of breast: Secondary | ICD-10-CM

## 2015-10-31 DIAGNOSIS — C50412 Malignant neoplasm of upper-outer quadrant of left female breast: Secondary | ICD-10-CM

## 2015-10-31 DIAGNOSIS — N183 Chronic kidney disease, stage 3 (moderate): Secondary | ICD-10-CM | POA: Diagnosis not present

## 2015-10-31 LAB — CBC WITH DIFFERENTIAL/PLATELET
BASO%: 0.9 % (ref 0.0–2.0)
BASOS ABS: 0 10*3/uL (ref 0.0–0.1)
EOS%: 1.1 % (ref 0.0–7.0)
Eosinophils Absolute: 0.1 10*3/uL (ref 0.0–0.5)
HCT: 34.7 % — ABNORMAL LOW (ref 34.8–46.6)
HGB: 11.9 g/dL (ref 11.6–15.9)
LYMPH%: 33.2 % (ref 14.0–49.7)
MCH: 30.9 pg (ref 25.1–34.0)
MCHC: 34.2 g/dL (ref 31.5–36.0)
MCV: 90.4 fL (ref 79.5–101.0)
MONO#: 0.4 10*3/uL (ref 0.1–0.9)
MONO%: 8.4 % (ref 0.0–14.0)
NEUT#: 2.7 10*3/uL (ref 1.5–6.5)
NEUT%: 56.4 % (ref 38.4–76.8)
Platelets: 224 10*3/uL (ref 145–400)
RBC: 3.84 10*6/uL (ref 3.70–5.45)
RDW: 14.6 % — ABNORMAL HIGH (ref 11.2–14.5)
WBC: 4.8 10*3/uL (ref 3.9–10.3)
lymph#: 1.6 10*3/uL (ref 0.9–3.3)

## 2015-10-31 LAB — COMPREHENSIVE METABOLIC PANEL
ALT: 18 U/L (ref 0–55)
AST: 21 U/L (ref 5–34)
Albumin: 4.1 g/dL (ref 3.5–5.0)
Alkaline Phosphatase: 111 U/L (ref 40–150)
Anion Gap: 9 mEq/L (ref 3–11)
BUN: 10.8 mg/dL (ref 7.0–26.0)
CALCIUM: 9.4 mg/dL (ref 8.4–10.4)
CHLORIDE: 110 meq/L — AB (ref 98–109)
CO2: 23 mEq/L (ref 22–29)
Creatinine: 1.2 mg/dL — ABNORMAL HIGH (ref 0.6–1.1)
EGFR: 54 mL/min/{1.73_m2} — AB (ref >90)
GLUCOSE: 102 mg/dL (ref 70–140)
POTASSIUM: 3.8 meq/L (ref 3.5–5.1)
SODIUM: 142 meq/L (ref 136–145)
Total Bilirubin: 0.47 mg/dL (ref 0.20–1.20)
Total Protein: 7.3 g/dL (ref 6.4–8.3)

## 2015-10-31 NOTE — Progress Notes (Signed)
[name]  Telephone:([phone] Fax:(336) [phone]     ID: Stacy Fernandez DOB: [birth year]  MR#: [id number]  JSE#:[id number]  Patient Care Team: Pcp Not In System as PCP - General [name], MD as PCP - Family Medicine (Nurse Practitioner) [name], MD as Consulting Physician (Radiation Oncology) [name], MD as Consulting Physician (General Surgery) [name], MD as Consulting Physician (Oncology) [name], MD as Consulting Physician (Gastroenterology) [name], MD as Consulting Physician (Pulmonary Disease) [name], MD as Consulting Physician (Obstetrics and Gynecology) OTHER MD:  [name] MD, [name] MD  CHIEF COMPLAINT: HER-2 positive, 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 She brought it to her primary physician's attention him a and on 02/21/2014 she underwent bilateral diagnostic mammography with tomography at [facility]. The breast composition was category B. In the left breast at the 3:00 position there was an irregular mass associated with skin retraction. Ultrasound performed on the same day confirmed a 1.1 cm irregular solid mass in the left breast at the 3:00 position. There were no abnormalities by sonography in the left axilla or in the superior portion of the breast, which is for the patient experiences some tenderness.  Biopsy of the left breast area in question 02/22/2014 showed ([id number]) and invasive ductal carcinoma, grade 3, estrogen and progesterone receptor negative, with an MIB-1 of 83%, and HER-2 amplified, the signals ratio being 2.07, and a copy number per cell 4.75.  On 02/28/2014 the patient underwent bilateral breast MRI, which showed in the posterior third of the left breast at the 3:00 position an irregular  spiculated mass measuring 1.8 cm. The left breast was unremarkable and there were no abnormal appearing lymph nodes.  The patient's subsequent history is as detailed below  INTERVAL HISTORY: Stacy Fernandez returns today for follow up of her breast cancer, accompanied by her husband Stacy Fernandez. His last visit here she underwent repair of a rectovaginal fistula she says it was "rough" but she was able to get back to work after only one week. She has no problems with urination or defecation at this point.  REVIEW OF SYSTEMS:  She is doing "pretty well". She is having a little bit of hip pain which she attributes to standing all day at work and she has been referred to orthopedics for further evaluation. She sleeps poorly. She has sinus problems, frequent ear infections, and her dentures don't fit well. She is short of breath when walking upstairs. She doesn't have any cough phlegm production or pleurisy. She still has some hematuria from her recent surgery. She bruises easily. She has chronic low back pain which is not more intense or persistent than before. She has a history of migraines, again stable. She feels anxious but not depressed. Hot flashes are moderate. A detailed review of systems today was otherwise noncontributory  PAST MEDICAL HISTORY: Past Medical History  Diagnosis Date  . Hypothyroidism   . Wears glasses   . Wears dentures     upper  . History of chemotherapy     left breast ca-- 05-09-2015   to 04-30-2015  . History of gastroesophageal reflux (GERD)     no problems since Nissen fundoplication  . Chemotherapy-induced peripheral neuropathy (HCC)     FEET  . Rectovaginal fistula   . Anxiety   .  Supraclinoid carotid artery aneurysm, small [facility]) neurologist-  dr willis/  dr Krista Blue    left internal - 54 mm;  aneurysm  vs.  infundibulum--  residual chronic migraine  . Chronic migraine     caused by brain aneurysm--  BOTOX TX'S  by dr Krista Blue  . COPD with emphysema [facility]) pulmologist--  dr Elsworth Soho     advanded emphysema per CT   . Skin changes related to chemotherapy     ARMS  . 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 CKD (chronic kidney disease), stage III     PAST SURGICAL HISTORY: Past Surgical History  Procedure Laterality Date  . Laparoscopic nissen fundoplication  [id number]  . Video bronchoscopy Bilateral 01/16/2013    Procedure: VIDEO BRONCHOSCOPY WITHOUT FLUORO;  Surgeon: [name], MD;  Location: [facility];  Service: Cardiopulmonary;  Laterality: Bilateral;  . Simple mastectomy with axillary sentinel node biopsy Bilateral 04/02/2014    Procedure: LEFT TOTAL MASTECTOMY WITH LEFT AXILLARY SENTINEL NODE BIOPSY, RIGHT PROPHYLACTIC MASTETCTOMY;  Surgeon: [name], MD;  Location: [place];  Service: General;  Laterality: Bilateral;  . Portacath placement N/A 04/02/2014    Procedure: INSERTION PORT-A-CATH;  Surgeon: [name], MD;  Location: [place];  Service: General;  Laterality: N/A;  . Left heart catheterization with coronary angiogram N/A 12/26/2012    Procedure: LEFT HEART CATHETERIZATION WITH CORONARY ANGIOGRAM;  Surgeon: Lorretta Harp, MD;  Location: [facility] CATH LAB;  Service: Cardiovascular;  Laterality: N/A;   Nonobstructive CAD/  40-50% ostial D1/  normal LVF, ef 60%  . Breast reconstruction with placement of tissue expander and flex hd (acellular hydrated dermis) Bilateral 09/25/2014    Procedure: PLACEMENT OF BILATERAL TISSUE EXPANDER AND  ACELLULAR DERMIS FOR BREAST RECONSTRUCTION ;  Surgeon: [name], MD;  Location: [place];  Service: Plastics;  Laterality: Bilateral;  . Esophagogastroduodenoscopy (egd) with propofol  01/18/2013  . Removal of bilateral tissue expanders with placement of bilateral breast implants Bilateral 12/18/2014     Procedure: REMOVAL OF BILATERAL TISSUE EXPANDERS,PLACEMENT SILICONE IMPLANTS ;  Surgeon: [name], MD;  Location: [place];  Service: Plastics;  Laterality: Bilateral;  . Liposuction with lipofilling Bilateral 12/18/2014    Procedure: LIPOFILLING TO BILATERAL CHEST;  Surgeon: [name], MD;  Location: [place];  Service: Plastics;  Laterality: Bilateral;  . Transthoracic echocardiogram  02-04-2015    grade 1 diastolic dysfunction, ef 60-63%/  trivial TR  . Appendectomy  1992  . Cholecystectomy  1999  . Total abdominal hysterectomy w/ bilateral salpingoophorectomy  1996  . Port-a-cath removal Right 06/28/2015    Procedure: REMOVAL PORT-A-CATH;  Surgeon: Leighton Ruff, MD;  Location: [facility];  Service: General;  Laterality: Right;  . Vesico-vaginal fistula repair N/A 07/17/2015    Procedure: REPAIR OF RECTOVAGINAL FISTULA;  Surgeon: [name], MD;  Location: [facility];  Service: Gynecology;  Laterality: N/A;    FAMILY HISTORY Family History  Problem Relation Age of Onset  . Heart failure Mother   . Colon cancer Father 18    stomach cancer also in 62s  . Throat cancer Brother 10    smoker  . Heart attack Maternal Grandmother   . Colon cancer Paternal Grandmother 80  . Cancer  Paternal Grandfather     kidney and bladder  . Throat cancer Brother 37    throat cancer, smoker  . Ovarian cancer Sister 64    ovarian cancer at 19, colorectal cancer at 67  . Breast cancer Paternal Aunt 48  . Ovarian cancer Other 44    niece with ovarian cancer   the patient's father died at the age of 52 from metastatic stomach cancer the patient's father's mother died from colon cancer at the age of 61. The patient's mother died at the age of 4. The patient had 2 brothers and 2 sisters. One brother died at the age of 61 from throat cancer metastatic to the lung. He was a smoker. A second brother Was diagnosed at age 26 with throat cancer  metastatic to lung. He has been given 90 days to live" is not looking good". One sister died from colon cancer metastatic to bone. One sister died of a drug overdose. There is no history of breast or bearing cancer in the family to the patient's knowledge.  GYNECOLOGIC HISTORY:  No LMP recorded. Patient has had a hysterectomy. Menarche age 72, first live birth age 49, the patient is [place] P4. She underwent hysterectomy with bilateral salpingo-oophorectomy approximately 1990. She took hormone replacement for approximately 2 years.  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 Her (second) husband, Stacy Fernandez, is retired. The patient has 2 sons and 2 daughters from an earlier marriage, all living nearby him a and of course she has 4 stepchildren through her current husband. Altogether they have 19 grandchildren. She attends a local [employer]: Not in place   HEALTH MAINTENANCE: Social History  Substance Use Topics  . Smoking status: Former Smoker -- 0.00 packs/day for 37 years    Types: Cigarettes, E-cigarettes    Quit date: 03/29/2014  . Smokeless tobacco: Never Used     Comment: uses vapor occasionally  . Alcohol Use: No     Colonoscopy: August 2015  PAP: May 2013  Bone density:  Lipid panel:  Allergies  Allergen Reactions  . Contrast Media [Iodinated Diagnostic Agents] Anaphylaxis  . Prednisone Shortness Of Breath and Swelling    CAN TOLERATE IF GIVEN BENADRYL PRIOR     Current Outpatient Prescriptions  Medication Sig Dispense Refill  . benzocaine-Menthol (DERMOPLAST) 20-0.5 % AERO Apply 1 application topically 4 (four) times daily as needed for irritation. 1 each 11  . buPROPion (WELLBUTRIN XL) 300 MG 24 hr tablet Take 300 mg by mouth every morning. Reported on 07/08/2015    . Diclofenac Potassium (CAMBIA) 50 MG PACK Take at onset of   migraine.  May repeat in 2 hours, if needed.  Max: 2 packs in 24 hours. 9 each 5  . DUONEB 0.5-2.5 (3) MG/3ML SOLN Inhale 3 mLs into the lungs every 6 (six) hours as needed (for shortness of breath). Reported on 07/08/2015  3  . fluticasone (FLONASE) 50 MCG/ACT nasal spray Place 1 spray into both nostrils daily as needed for allergies or rhinitis.    . furosemide (LASIX) 20 MG tablet Take 0.5 tablets (10 mg total) by mouth every morning. 30 tablet 0  . gabapentin (NEURONTIN) 300 MG capsule TAKE 1 CAPSULE (300 MG TOTAL) BY MOUTH 4 (FOUR) TIMES DAILY.---  CURRENTLY PT TAKES ONLY IN PM (Patient taking differently: Take 300 mg by mouth daily. )  120 capsule 3  . Garcinia Cambogia-Chromium 500-200 MG-MCG TABS Take 1 tablet by mouth 2 (two) times daily. Reported on 07/08/2015    . levothyroxine (SYNTHROID, LEVOTHROID) 125 MCG tablet Take 125 mcg by mouth daily before breakfast.    . montelukast (SINGULAIR) 10 MG tablet Take 10 mg by mouth at bedtime.     . potassium chloride SA (KLOR-CON M20) 20 MEQ tablet TAKE 1 TABLET (20 MEQ TOTAL) BY MOUTH ONCE DAY--- TAKES IN AM 30 tablet 1  . PROAIR HFA 108 (90 BASE) MCG/ACT inhaler Inhale 2 puffs into the lungs every 4 (four) hours as needed for wheezing or shortness of breath. Reported on 07/08/2015    . rizatriptan (MAXALT-MLT) 10 MG disintegrating tablet Take 10 mg by mouth as needed for migraine. May repeat in 2 hours if needed    . senna-docusate (SENOKOT-S) 8.6-50 MG tablet Take 1 tablet by mouth at bedtime as needed for mild constipation. 30 tablet 1  . SUMAtriptan (IMITREX) 6 MG/0.5ML SOLN injection Inject 0.5 mLs (6 mg total) into the skin every 2 (two) hours as needed for migraine or headache. May repeat in 2 hours if headache persists or recurs. 12 vial 6  . traMADol (ULTRAM) 50 MG tablet Take 1-2 tablets (50-100 mg total) by mouth every 6 (six) hours as needed. 20 tablet 0  . traZODone (DESYREL) 100 MG tablet TAKE 1 TABLET (100 MG TOTAL) BY MOUTH AT  BEDTIME. 30 tablet 1  . verapamil (CALAN-SR) 120 MG CR tablet TAKE 1 TABLET BY MOUTH AT BEDTIME ( MAX 30 DAY SUPPLY PER INSURANCE)  --- migraine prevention 30 tablet 6   No current facility-administered medications for this visit.    OBJECTIVE: Middle-aged white woman In no acute distress Filed Vitals:   10/31/15 1536  BP: 117/70  Pulse: 89  Temp: 97.9 F (36.6 C)  Resp: 18     Body mass index is 26.43 kg/(m^2).    ECOG FS:1 - Symptomatic but completely ambulatory   Sclerae unicteric, pupils round and equal Oropharynx clear and moist-- no thrush or other lesions No cervical or supraclavicular adenopathy Lungs no rales or rhonchi Heart regular rate and rhythm Abd soft, nontender, positive bowel sounds MSK no focal spinal tenderness, no upper extremity lymphedema, no ankle edema Neuro: nonfocal, well oriented, appropriate affect Breasts: Status post bilateral mastectomies with implant reconstruction. There is no evidence of recurrent disease. Both axillae are benign  LAB RESULTS:  CMP     Component Value Date/Time   NA 142 10/31/2015 1501   NA 140 07/11/2015 1600   K 3.8 10/31/2015 1501   K 3.9 07/11/2015 1600   CL 109 07/11/2015 1600   CO2 23 10/31/2015 1501   CO2 24 07/11/2015 1600   GLUCOSE 102 10/31/2015 1501   GLUCOSE 89 07/11/2015 1600   BUN 10.8 10/31/2015 1501   BUN 15 07/11/2015 1600   CREATININE 1.2* 10/31/2015 1501   CREATININE 1.19* 07/11/2015 1600   CALCIUM 9.4 10/31/2015 1501   CALCIUM 9.0 07/11/2015 1600   PROT 7.3 10/31/2015 1501   PROT 7.0 07/11/2015 1600   ALBUMIN 4.1 10/31/2015 1501   ALBUMIN 4.3 07/11/2015 1600   AST 21 10/31/2015 1501   AST 18 07/11/2015 1600   ALT 18 10/31/2015 1501   ALT 17 07/11/2015 1600   ALKPHOS 111 10/31/2015 1501   ALKPHOS 118 07/11/2015 1600   BILITOT 0.47 10/31/2015 1501   BILITOT 0.6 07/11/2015 1600   GFRNONAA 51* 07/11/2015 1600   GFRAA 59* 07/11/2015  1600    I No results found for: SPEP  Lab Results    Component Value Date   WBC 4.8 10/31/2015   NEUTROABS 2.7 10/31/2015   HGB 11.9 10/31/2015   HCT 34.7* 10/31/2015   MCV 90.4 10/31/2015   PLT 224 10/31/2015      Chemistry      Component Value Date/Time   NA 142 10/31/2015 1501   NA 140 07/11/2015 1600   K 3.8 10/31/2015 1501   K 3.9 07/11/2015 1600   CL 109 07/11/2015 1600   CO2 23 10/31/2015 1501   CO2 24 07/11/2015 1600   BUN 10.8 10/31/2015 1501   BUN 15 07/11/2015 1600   CREATININE 1.2* 10/31/2015 1501   CREATININE 1.19* 07/11/2015 1600      Component Value Date/Time   CALCIUM 9.4 10/31/2015 1501   CALCIUM 9.0 07/11/2015 1600   ALKPHOS 111 10/31/2015 1501   ALKPHOS 118 07/11/2015 1600   AST 21 10/31/2015 1501   AST 18 07/11/2015 1600   ALT 18 10/31/2015 1501   ALT 17 07/11/2015 1600   BILITOT 0.47 10/31/2015 1501   BILITOT 0.6 07/11/2015 1600       No results found for: LABCA2  No components found for: [id number]  No results for input(s): INR in the last 168 hours.  Urinalysis    Component Value Date/Time   COLORURINE YELLOW 02/18/2015 0510   APPEARANCEUR CLOUDY* 02/18/2015 0510   LABSPEC 1.018 02/18/2015 0510   LABSPEC 1.010 07/10/2014 1004   PHURINE 5.0 02/18/2015 0510   PHURINE 6.0 07/10/2014 1004   GLUCOSEU NEGATIVE 02/18/2015 0510   GLUCOSEU Negative 07/10/2014 1004   HGBUR SMALL* 02/18/2015 0510   HGBUR Moderate 07/10/2014 1004   BILIRUBINUR NEGATIVE 02/18/2015 0510   BILIRUBINUR Negative 07/10/2014 1004   KETONESUR NEGATIVE 02/18/2015 0510   KETONESUR Negative 07/10/2014 1004   PROTEINUR NEGATIVE 02/18/2015 0510   PROTEINUR Negative 07/10/2014 1004   UROBILINOGEN 0.2 02/18/2015 0510   UROBILINOGEN 0.2 07/10/2014 1004   NITRITE NEGATIVE 02/18/2015 0510   NITRITE Negative 07/10/2014 1004   LEUKOCYTESUR MODERATE* 02/18/2015 0510   LEUKOCYTESUR Negative 07/10/2014 1004    STUDIES: No results found.   ASSESSMENT: 55 y.o. BRCA negative [place] woman status post left breast biopsy  02/22/2014 for a clinical T2/T3 NX, stage II or III invasive ductal carcinoma, grade 3, estrogen and progesterone receptor negative, with an MIB-1 of 83%, and HER-2 amplified with a signals a ratio of 2.07and a copy number per cell of 4.75  (1) biopsy of an additional area of enhancement in the left breast 03/08/2014 showed ductal carcinoma in situ, estrogen and progesterone receptor negative.  (2) status post bilateral mastectomies with left sentinel lymph node sampling 04/02/2014, showing:  (a) on the right, benign breast tissue including a single negative lymph node  (b) On  the left, a  pT1c pN0, stage IA invasive ductal carcinoma, grade 3, with negative margins  (3) adjuvant chemotherapy started 05/08/2014, consisting of carboplatin and docetaxel given every 3 weeks x6, together with trastuzumab, with neulasta day 2   (a) docetaxel removed from final cycle 08/21/2014 because of persistent neuropathy symptoms  (4) trastuzumab continued to complete a year (last dose 04/30/2015)  (a) echo 02/04/15 showed a well-preserved ejection fraction  (5) reconstruction with bilateral silicone implants 90/24/0973 ([place])  (6) tobacco abuse: The patient quit smoking 03/30/2014  (7) peripheral neuropathy secondary to chemotherapy: chiefly involving feet  (8) lung nodules resolved on 01/24/15 CT scan  (9) pelvic/ abdominal pain:  extensive evaluation shows no evidence of metastatic disease but are consistent with a rectovaginal fistula; 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  (10) chronic kidney disease level III:  PLAN: Stacy Fernandez is a year and a half out from definitive surgery with no evidence of disease recurrence. This is favorable because she has gained some weight over the last few months with all its medical events she is thinking of going back to smoking. We discussed that at length today and she understands she would be cutting her life span by some years if she does go back to smoking.  We discussed diet and  exercise and particularly cutting back on carbohydrates including sweet tea and potatoes which is where she is adding extra calories. We will follow her weight at the next visit.  At this point she feels less anxious about her diagnosis and more understanding of her overall prognosis, which is good.  I will see her again in 6 months. She knows to call for any problems that may develop before that visit.   [name], MD   10/31/2015 3:50 PM

## 2015-10-31 NOTE — Telephone Encounter (Signed)
appt made and avs printed °

## 2015-12-03 ENCOUNTER — Telehealth: Payer: Self-pay | Admitting: Neurology

## 2015-12-03 ENCOUNTER — Encounter: Payer: Self-pay | Admitting: *Deleted

## 2015-12-03 NOTE — Telephone Encounter (Signed)
Patient is calling and states her new insurance company does not see Cambia 50 mg as a preferred medicine.  Patient states she is not getting the generic now even though that is what her chart shows. Her insurance suggests diclofenac-potassiam sodium or diclofenac sodium ER. Please send Rx to [place].

## 2015-12-03 NOTE — Telephone Encounter (Signed)
Called the pharmacy and her copay is now $105.00 because it is a non-formulary medication.  I gave her the [facility] # [phone]) and the website.  Since she has [employer], she should be able to qualify for the savings program that would decrease he copay to 20.00.  She will call me back if this plan is not successful.

## 2015-12-04 ENCOUNTER — Telehealth: Payer: Self-pay | Admitting: *Deleted

## 2015-12-04 NOTE — Telephone Encounter (Signed)
Left message for a return call

## 2015-12-04 NOTE — Telephone Encounter (Signed)
------------------------------------------------------------   Stacy Fernandez               CID [id number]  Patient SAME                 Pt's Dr Krista Blue          Area Code 336 Phone# [phone] RX, ASKED FOR DR. YAN'S NURSE   NON ER                                               Disp:Y/N Y If Y = C/B If No Response In 52minutes ============================================================

## 2015-12-04 NOTE — Telephone Encounter (Signed)
Pt returned call. I skyped nurse and was told she will call pt back. Pt expressed understanding

## 2015-12-04 NOTE — Telephone Encounter (Signed)
Cambia rx savings card placed up front for patient.  She will come to the office to pick up.

## 2015-12-30 ENCOUNTER — Other Ambulatory Visit: Payer: Self-pay | Admitting: Oncology

## 2016-02-03 ENCOUNTER — Ambulatory Visit: Payer: BLUE CROSS/BLUE SHIELD | Admitting: Obstetrics & Gynecology

## 2016-02-12 ENCOUNTER — Ambulatory Visit (INDEPENDENT_AMBULATORY_CARE_PROVIDER_SITE_OTHER): Payer: BLUE CROSS/BLUE SHIELD | Admitting: Obstetrics & Gynecology

## 2016-02-12 ENCOUNTER — Encounter: Payer: Self-pay | Admitting: Obstetrics & Gynecology

## 2016-02-12 VITALS — BP 120/80 | HR 80 | Ht 66.0 in | Wt 173.0 lb

## 2016-02-12 DIAGNOSIS — R152 Fecal urgency: Secondary | ICD-10-CM | POA: Diagnosis not present

## 2016-02-12 DIAGNOSIS — K5732 Diverticulitis of large intestine without perforation or abscess without bleeding: Secondary | ICD-10-CM

## 2016-02-12 MED ORDER — METRONIDAZOLE 500 MG PO TABS: 500.0000 mg | ORAL_TABLET | Freq: Two times a day (BID) | ORAL | 0 refills | Status: DC

## 2016-02-12 MED ORDER — CIPROFLOXACIN HCL 500 MG PO TABS: 500.0000 mg | ORAL_TABLET | Freq: Two times a day (BID) | ORAL | 0 refills | Status: DC

## 2016-02-12 NOTE — Progress Notes (Signed)
      Chief Complaint  Patient presents with  . Blood In Stools    unable to control bowel movement x 1/ pulling on  left side of stomach.    Blood pressure 120/80, pulse 80, height 5\' 6"  (1.676 m), weight 173 lb (78.5 kg).  55 y.o. No obstetric history on file. No LMP recorded. Patient has had a hysterectomy. The current method of family planning is .  Subjective 7 months post op from RV fistula spontaneous repair who has had no problems post oepratively Had 1 episode of loss of feces but per rectum not vagina No FCD No h/o diverticulitis  Objective abd soft tender LLQ Perineum well healed no feces or erythema Bimanual tender LLQ ?diverticulitis  Pertinent ROS No burning with urination, frequency or urgency No nausea, vomiting or diarrhea Nor fever chills or other constitutional symptoms   Labs or studies     Impression Diagnoses this Encounter::   ICD-9-CM ICD-10-CM   1. Diverticulitis large intestine w/o perforation or abscess w/o bleeding 562.11 K57.32     Established relevant diagnosis(es): Hx of RV fistula repair  Plan/Recommendations: Meds ordered this encounter  Medications  . DISCONTD: Diclofenac Potassium (CAMBIA PO)    Sig: Take by mouth.  . DISCONTD: ciprofloxacin (CIPRO) 500 MG tablet    Sig: Take 1 tablet (500 mg total) by mouth 2 (two) times daily.    Dispense:  14 tablet    Refill:  0  . DISCONTD: metroNIDAZOLE (FLAGYL) 500 MG tablet    Sig: Take 1 tablet (500 mg total) by mouth 2 (two) times daily.    Dispense:  14 tablet    Refill:  0    Labs or Scans Ordered: No orders of the defined types were placed in this encounter.   Management:: cipro/flagyl  Follow up Return in about 3 weeks (around 03/04/2016) for Follow up, with Dr Elonda Husky.         All questions were answered.

## 2016-03-05 ENCOUNTER — Ambulatory Visit: Payer: BLUE CROSS/BLUE SHIELD | Admitting: Obstetrics & Gynecology

## 2016-03-19 ENCOUNTER — Ambulatory Visit: Payer: BLUE CROSS/BLUE SHIELD | Admitting: Obstetrics & Gynecology

## 2016-03-25 ENCOUNTER — Encounter: Payer: Self-pay | Admitting: Obstetrics & Gynecology

## 2016-03-25 ENCOUNTER — Ambulatory Visit (INDEPENDENT_AMBULATORY_CARE_PROVIDER_SITE_OTHER): Payer: BLUE CROSS/BLUE SHIELD | Admitting: Obstetrics & Gynecology

## 2016-03-25 VITALS — BP 122/90 | HR 78 | Wt 168.0 lb

## 2016-03-25 DIAGNOSIS — K5732 Diverticulitis of large intestine without perforation or abscess without bleeding: Secondary | ICD-10-CM

## 2016-03-25 NOTE — Progress Notes (Signed)
      Chief Complaint  Patient presents with  . Follow-up    Diverticulitis    Blood pressure 122/90, pulse 78, weight 168 lb (76.2 kg).  55 y.o. No obstetric history on file. No LMP recorded. Patient has had a hysterectomy. The current method of family planning is post menopausal status.  Subjective Pt is about 8 months post op from a repair of a rectovaginal fistula The patient had 1 episode recently of fecal incontinence, as far as she knows was not per vagina but completely per rectum Exam at that visit was concerning for possible diverticulitis and I treated her with cipro/flagyl She is here today for follow up from that visit  She reports immediately feeling better with the antibiotic regimen, no further incontinence or LLQ pain or fever Objective Gen WDWN female NAD Abdomen soft non tender benign no masses  Pertinent ROS Per HPI  Labs or studies none    Impression Diagnoses this Encounter::   ICD-9-CM ICD-10-CM   1. Diverticulitis large intestine w/o perforation or abscess w/o bleeding 562.11 K57.32     Established relevant diagnosis(es): Hx of spontaneousRV fistula with subsequent  repair  Plan/Recommendations: No orders of the defined types were placed in this encounter.   Labs or Scans Ordered: No orders of the defined types were placed in this encounter.   Management::   Follow up Return in about 6 months (around 09/22/2016) for Follow up, with Dr Elonda Husky.        Face to face time:  15 minutes  Greater than 50% of the visit time was spent in counseling and coordination of care with the patient.  The summary and outline of the counseling and care coordination is summarized in the note above.   All questions were answered.

## 2016-03-30 ENCOUNTER — Other Ambulatory Visit: Payer: Self-pay | Admitting: Oncology

## 2016-04-01 IMAGING — CT CT PELVIS W/O CM
3 of 6 series · 15 of 36 positions shown, 19 images · IV contrast (RECTAL ONLY)
Comparison: CT scan 04/25/2015

CLINICAL DATA: Evaluate for rectovaginal fistula. Several episodes
of stool in the vagina. Pelvic and lower abdominal pain for 6 weeks.

EXAM:
CT PELVIS WITHOUT CONTRAST
TECHNIQUE: Multidetector CT imaging of the pelvis was performed following the
standard protocol without intravenous contrast.

[Series 3: routine pelvis · axial · 0.78mm/px · z∈[-200,-70]mm · 3 of 51 slices shown, 7 images]
[im 13/51  soft-tissue]
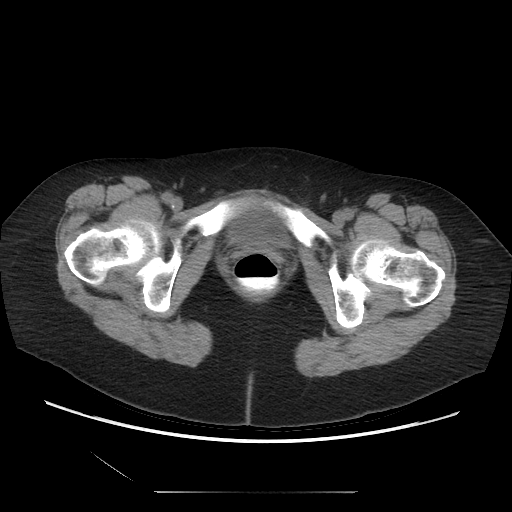
[im 13/51  lung]
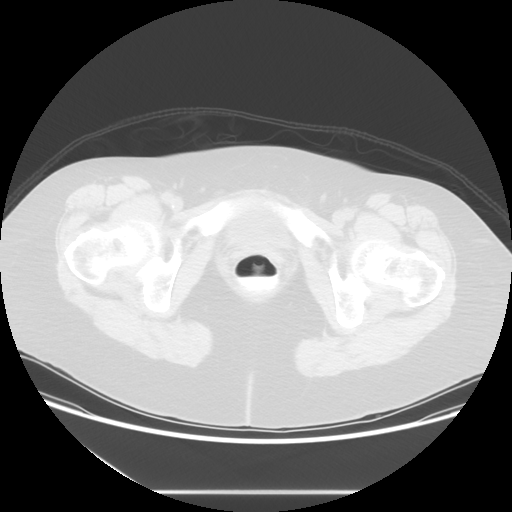
[im 13/51  bone]
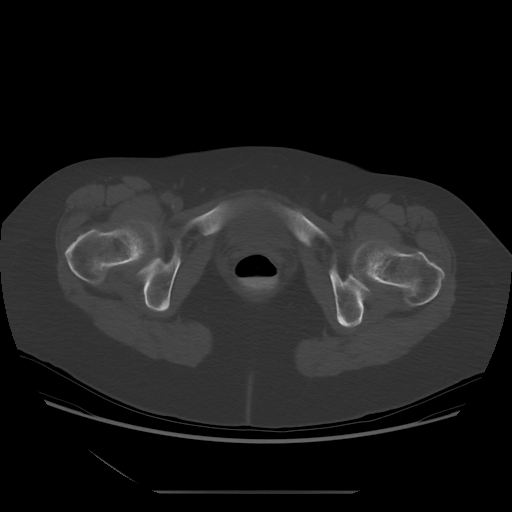
[im 26/51  soft-tissue]
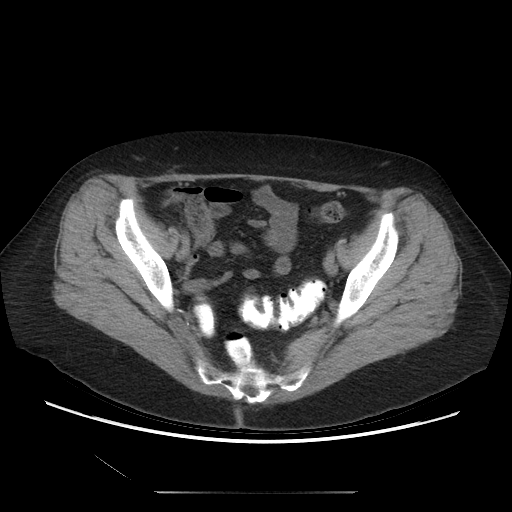
[im 26/51  lung]
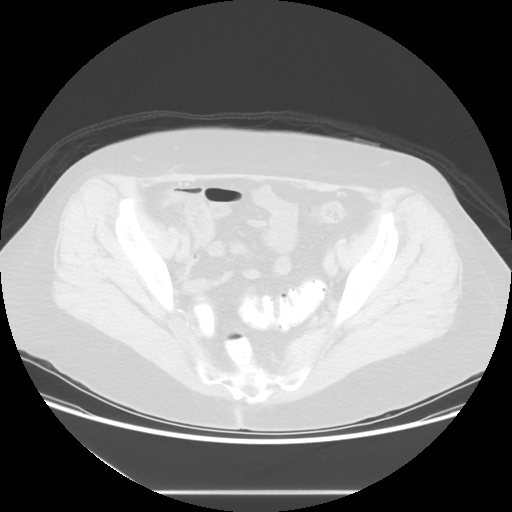
[im 38/51  soft-tissue]
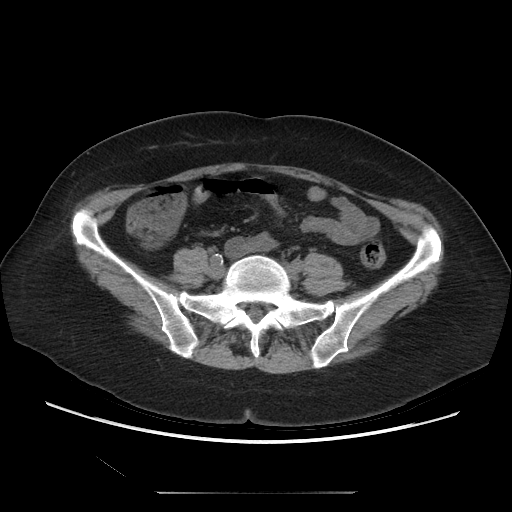
[im 38/51  lung]
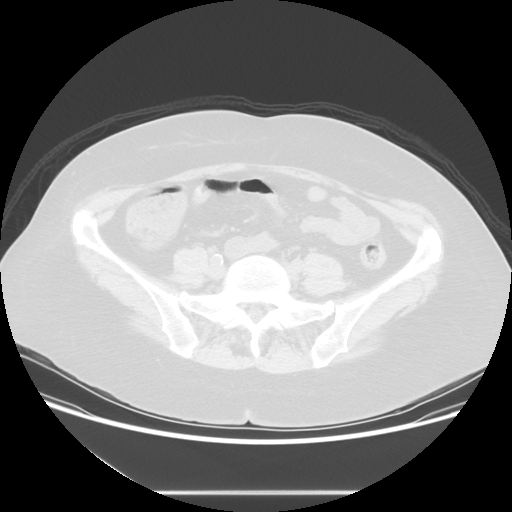

[Series 602: sagittal body · sagittal · 0.78mm/px · 8 of 161 slices shown (1 of 2)]
[im 15/161  soft-tissue]
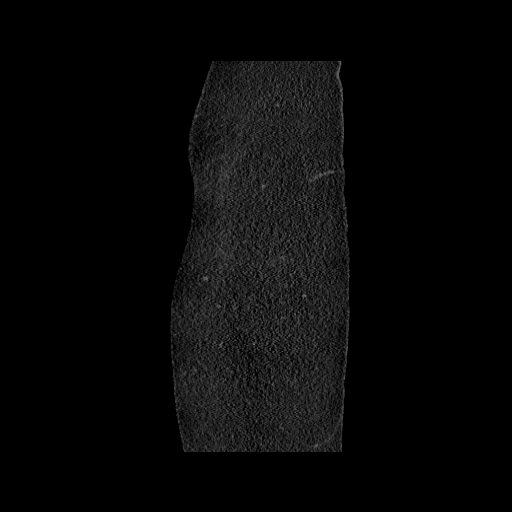
[im 30/161  soft-tissue]
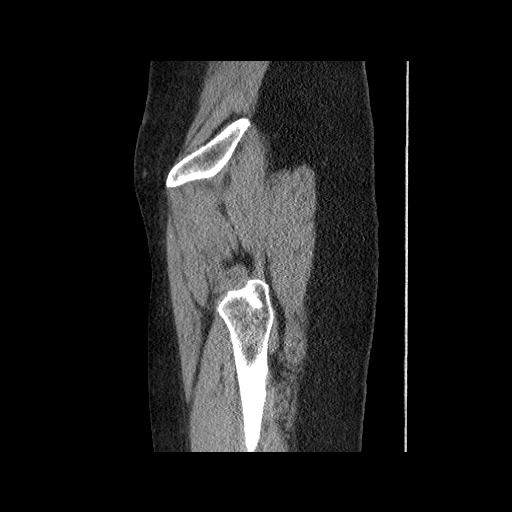
[im 59/161  soft-tissue]
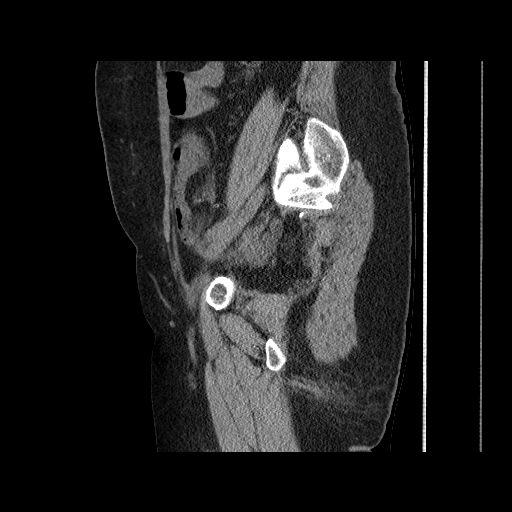
[im 73/161  soft-tissue]
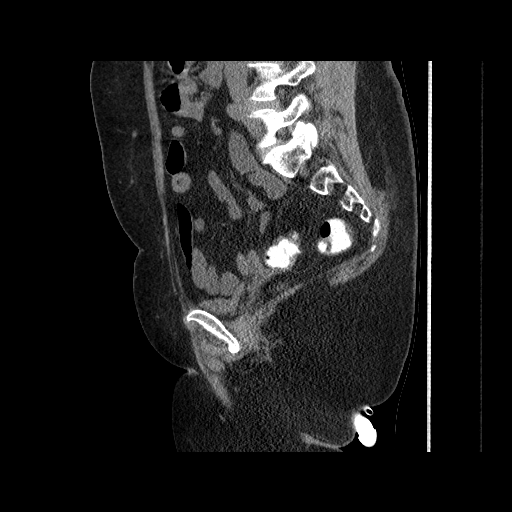
[im 88/161  soft-tissue]
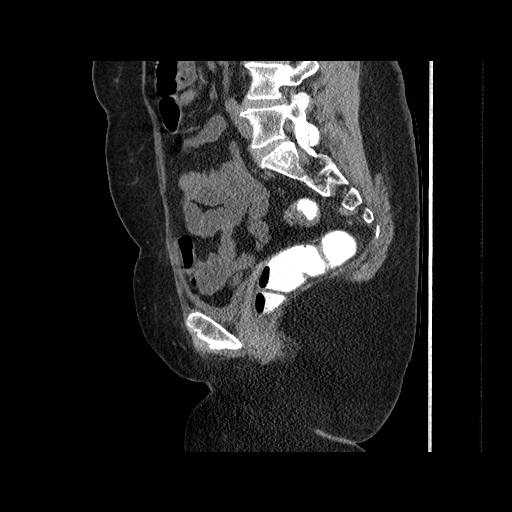
[im 102/161  soft-tissue]
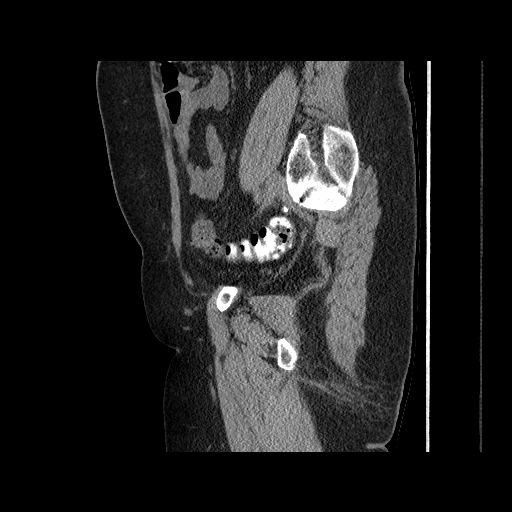
[im 131/161  soft-tissue]
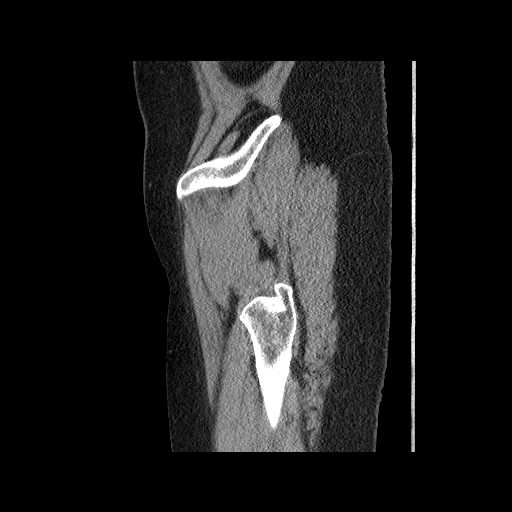
[im 146/161  soft-tissue]
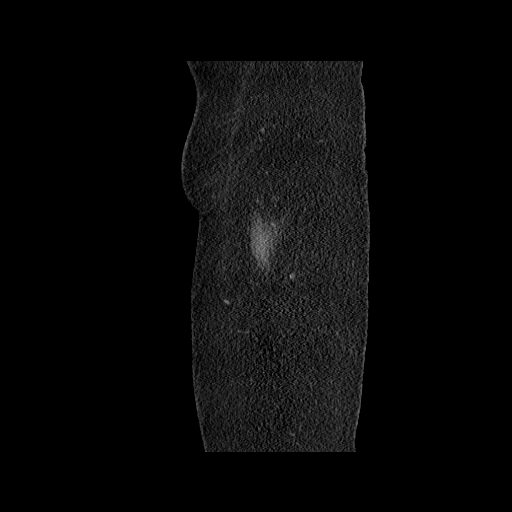

[Series 605: sagittal body · sagittal · 0.78mm/px · 4 of 161 slices shown (2 of 2)]
[im 15/161  soft-tissue]
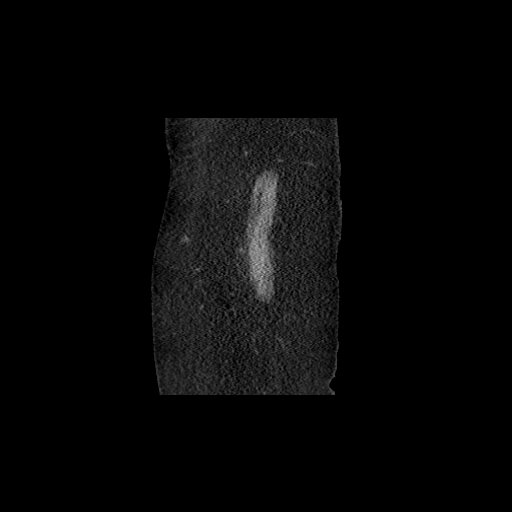
[im 30/161  soft-tissue]
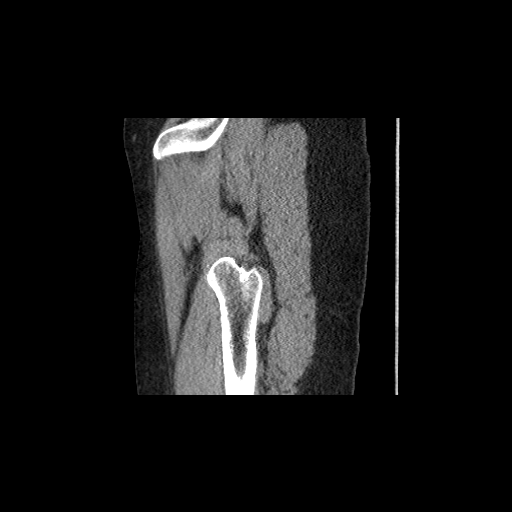
[im 59/161  soft-tissue]
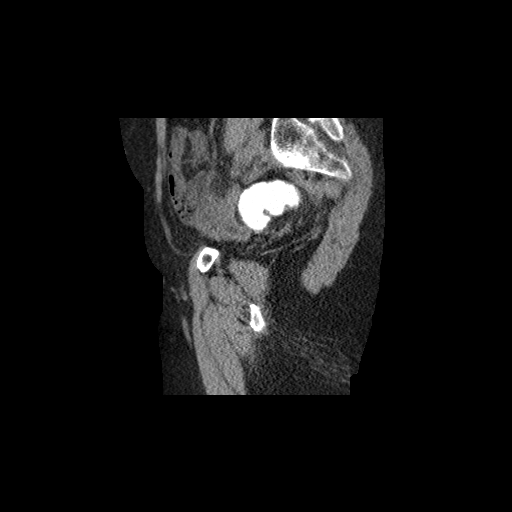
[im 73/161  soft-tissue]
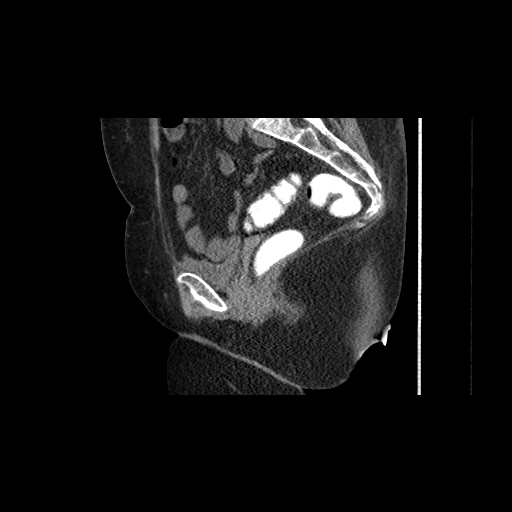

[15 of 36 positions shown; findings below may reference images not displayed]

FINDINGS: Rectal contrast was used and demonstrates the anal vaginal fistula.
There is a low anal fistula located at the 12 o'clock position which
is extending to the lower most aspect of the vagina. The rectum is
normal. No perianal abscess is identified. The visualized sigmoid
colon and cecum are unremarkable. The terminal ileum appears normal.
The appendix is normal. No pelvic mass or adenopathy. The uterus is
surgically absent. No inguinal mass or adenopathy.

No significant bony findings.
IMPRESSION: CT findings consistent with a low perianal fistula at the 12 o'clock
position communicating with the lower most aspect of the vagina
likely near the introitus.

## 2016-04-03 ENCOUNTER — Telehealth: Payer: Self-pay | Admitting: Neurology

## 2016-04-03 NOTE — Telephone Encounter (Signed)
Last office visit was October 2016, she supposed to come in for Botox injection as migraine prevention, canceled December 2016 appointment, also January 2017 appointment,  She needs to come in for Botox injection as migraine prevention if she still has frequent migraine headaches, make sure the paperwork is completed,  She needs to come in for office visit to discuss medication choices, he was a patient of Dr. Jannifer Franklin in the past,, it is okay to see nurse practitioner or Dr. Jannifer Franklin if she does not want to have Botox injection at this point

## 2016-04-03 NOTE — Telephone Encounter (Signed)
Pt called said the distributor is not doing promotion on the cambia 50mg  for 9pack anymore. She can not afford $105 for the 9 packs. Is there something that will be comparable to it that could be prescribled. Please call

## 2016-04-06 NOTE — Telephone Encounter (Signed)
Left message for a return call

## 2016-04-06 NOTE — Telephone Encounter (Signed)
Pt returned call this morning, she is at work and does not have a different # to call. Pt said the nurse can leave message with her husband Lital Zapp.

## 2016-04-06 NOTE — Telephone Encounter (Signed)
Pt returned call. Nurse will have to call her back

## 2016-04-06 NOTE — Telephone Encounter (Signed)
She did not feel Botox was helpful.  She is unable to afford Cambia.  She has been placed on the schedule to discuss alternate treatment options for her migraines.

## 2016-04-06 NOTE — Telephone Encounter (Signed)
Pt's husband returned RN call

## 2016-04-07 ENCOUNTER — Encounter: Payer: Self-pay | Admitting: Nurse Practitioner

## 2016-04-07 ENCOUNTER — Ambulatory Visit (INDEPENDENT_AMBULATORY_CARE_PROVIDER_SITE_OTHER): Payer: BLUE CROSS/BLUE SHIELD | Admitting: Nurse Practitioner

## 2016-04-07 VITALS — BP 106/75 | HR 84 | Ht 66.0 in | Wt 166.0 lb

## 2016-04-07 DIAGNOSIS — G43809 Other migraine, not intractable, without status migrainosus: Secondary | ICD-10-CM

## 2016-04-07 MED ORDER — SUMATRIPTAN SUCCINATE 100 MG PO TABS: 100.0000 mg | ORAL_TABLET | Freq: Once | ORAL | 2 refills | Status: DC | PRN

## 2016-04-07 MED ORDER — DIVALPROEX SODIUM ER 500 MG PO TB24: 500.0000 mg | ORAL_TABLET | Freq: Every day | ORAL | 2 refills | Status: DC

## 2016-04-07 MED ORDER — ONDANSETRON HCL 4 MG PO TABS: 4.0000 mg | ORAL_TABLET | Freq: Three times a day (TID) | ORAL | 6 refills | Status: DC | PRN

## 2016-04-07 NOTE — Progress Notes (Signed)
[facility]  PATIENT: Stacy Fernandez DOB: 19-Jun-1961   REASON FOR VISIT: Follow-up for migraine HISTORY FROM: Patient and husband    HISTORY OF PRESENT ILLNESS:UPDATE 09/19/2017CM [name], 55 year old female returns for follow-up. She has history of migraines since May 2013. Botox was beneficial for a while. She has tried several preventatives to include propranolol, Topamax, Effexor, without helping. Cambia worked but her insurance would not cover this. Her last headache was Saturday. She has not kept a record of her headaches. She is not aware of any triggers specifically. MRA from last visit was unchanged from 2014. She returns for reevaluation   HISTORY10/6/17 YY Ms. Coody is a 55 year old right-handed white female referred by Dr. Jannifer Franklin for Botox injection as migraine prevention, last injection was January 2016  She had frequent headaches since May 2013, she woke up in May, notice left-sided headache, across her left forehead, feel likes that her left eye is going to pop out, she has no visual loss, first severe headache lasts about 2 days, tried numerous over-the-counter medications, including Excedrin Migraine, Tylenol, Aleve without help,  Ever since the initial episode, she has frequent headaches, most on the left side, occasionally on the right side, excruciating pounding headache, with associated light noise sensitivity, nauseous, she often woke up from sleep with headaches  CT head without contrast was normal, MRI cervical showed mild spondylitic disease, but no acute lesions MRI of the brain done previously was normal. MRA of the head showed small 82mm projection near the left supraclinoid internal carotid artery may represent small aneurysm vs. infundibulum of hypoplastic posterior communicating artery Carotid Doppler study done previously was normal.   Over the past 2 years, she has tried different preventative medications this including propranolol,  Topamax, Effexor, without helping, She still has 6 headaches in a week , moderately severe, lasting for few days She received last Botox injection as migraine prevention since Jan 2015, responded very well,  She has double msectomectomy for left  brest cancer, chemotherapy, since last injection in August 2015, she has more frequent headaches during the past few months because of her treatment, Maxalt, Imitrex subcutaneous injection was not as helpful. 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 This is probably incidental, but given the history of some recent bowel incontinence, clinical correlation with any abnormal vaginal discharge is recommended in ruling out anovaginal fistula or fistula-in-ano. Emphysema. Aortoiliac atherosclerotic vascular disease. She will be evaluated by gynecologist soon  She is now complains of frequent left hemisphere headaches, severe, 5 out of 7 days, each headaches last for a few hours to a few days, Maxalt only provide limited help, Imitrex injection works better, but she only gets to injection each months from [facility], Toradol 10 mg as needed only provide limited help.    Previous Botox injection was helpful She is also very worried about previous MRA  Of Small 95mm projection of the left supraclinoid internal carotid artery may represent small aneurysm vs. infundibulum. She wants to have repeat MRA to follow up on the progress    REVIEW OF SYSTEMS: Full 14 system review of systems performed and notable only for those listed, all others are neg:  Constitutional: Fatigue Cardiovascular: neg Ear/Nose/Throat:  neg  Skin: neg Eyes: Sensitivity to light Respiratory:  neg Gastroitestinal: neg  Hematology/Lymphatic: neg  Endocrine: neg Musculoskeletal: Aching muscles Allergy/Immunology: neg Neurological: Headache Psychiatric: neg Sleep : neg   ALLERGIES: Allergies  Allergen Reactions  . Contrast Media [Iodinated Diagnostic Agents] Anaphylaxis  . Prednisone Shortness Of Breath and Swelling    CAN TOLERATE IF GIVEN BENADRYL PRIOR     HOME MEDICATIONS: Outpatient Medications Prior to Visit  Medication Sig Dispense Refill  . DUONEB 0.5-2.5 (3) MG/3ML SOLN Inhale 3 mLs into the lungs every 6 (six) hours as needed (for shortness of breath). Reported on 07/08/2015  3  . furosemide (LASIX) 20 MG tablet Take 0.5 tablets (10 mg total) by mouth every morning. 30 tablet 0  . levothyroxine (SYNTHROID, LEVOTHROID) 125 MCG tablet Take 125 mcg by mouth daily before breakfast.    . montelukast (SINGULAIR) 10 MG tablet Take 10 mg by mouth at bedtime.     . potassium chloride SA (KLOR-CON M20) 20 MEQ tablet TAKE 1 TABLET (20 MEQ TOTAL) BY MOUTH ONCE DAY--- TAKES IN AM 30 tablet 1  . PROAIR HFA 108 (90 BASE) MCG/ACT inhaler Inhale 2 puffs into the lungs every 4 (four) hours as needed for wheezing or shortness of breath. Reported on 07/08/2015    . traZODone (DESYREL) 100 MG tablet TAKE 1 TABLET (100 MG TOTAL) BY MOUTH AT BEDTIME. 30 tablet 1  . Diclofenac Potassium (CAMBIA PO) Take by mouth.    . metroNIDAZOLE (FLAGYL) 500 MG tablet Take 1 tablet (500 mg total) by mouth 2 (two) times daily. 14 tablet 0   No facility-administered medications prior to visit.     PAST MEDICAL HISTORY: Past Medical History:  Diagnosis Date  . Anxiety   . 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 Chemotherapy-induced peripheral neuropathy (HCC)    FEET  . Chronic migraine     caused by brain aneurysm--  BOTOX TX'S  by dr Krista Blue  . CKD (chronic kidney disease), stage III   . COPD with emphysema [facility]) pulmologist--  dr Elsworth Soho   advanded emphysema per CT   . History of chemotherapy    left breast ca-- 05-09-2015   to 04-30-2015  . History of gastroesophageal reflux (GERD)    no problems since Nissen fundoplication  . Hypothyroidism   . Rectovaginal fistula   . Skin changes related to chemotherapy    ARMS  . Supraclinoid carotid artery aneurysm, small [facility]) neurologist-  dr willis/  dr Krista Blue   left internal - 2 mm;  aneurysm  vs.  infundibulum--  residual chronic migraine  . Wears dentures    upper  . Wears glasses     PAST SURGICAL HISTORY: Past Surgical History:  Procedure Laterality Date  . APPENDECTOMY  1992  . BREAST RECONSTRUCTION WITH PLACEMENT OF TISSUE EXPANDER AND FLEX HD (ACELLULAR HYDRATED DERMIS) Bilateral 09/25/2014   Procedure: PLACEMENT OF BILATERAL TISSUE EXPANDER AND  ACELLULAR DERMIS FOR BREAST RECONSTRUCTION ;  Surgeon: [name], MD;  Location: [place];  Service: Plastics;  Laterality: Bilateral;  . CHOLECYSTECTOMY  1999  . ESOPHAGOGASTRODUODENOSCOPY (EGD) WITH PROPOFOL  01/18/2013  . LAPAROSCOPIC NISSEN FUNDOPLICATION  [id number]  . LEFT HEART CATHETERIZATION WITH CORONARY ANGIOGRAM N/A 12/26/2012   Procedure: LEFT HEART CATHETERIZATION WITH  CORONARY ANGIOGRAM;  Surgeon: Lorretta Harp, MD;  Location: [facility] CATH LAB;  Service: Cardiovascular;  Laterality: N/A;   Nonobstructive CAD/  40-50% ostial D1/  normal LVF, ef 60%  . LIPOSUCTION WITH LIPOFILLING Bilateral 12/18/2014   Procedure: LIPOFILLING TO BILATERAL CHEST;  Surgeon: [name], MD;  Location: [place];  Service: Plastics;  Laterality: Bilateral;  . PORT-A-CATH REMOVAL Right 06/28/2015   Procedure: REMOVAL PORT-A-CATH;  Surgeon: Leighton Ruff, MD;  Location: [facility];  Service: General;  Laterality: Right;  . PORTACATH  PLACEMENT N/A 04/02/2014   Procedure: INSERTION PORT-A-CATH;  Surgeon: [name], MD;  Location: [place];  Service: General;  Laterality: N/A;  . REMOVAL OF BILATERAL TISSUE EXPANDERS WITH PLACEMENT OF BILATERAL BREAST IMPLANTS Bilateral 12/18/2014   Procedure: REMOVAL OF BILATERAL TISSUE EXPANDERS,PLACEMENT SILICONE IMPLANTS ;  Surgeon: [name], MD;  Location: [place];  Service: Plastics;  Laterality: Bilateral;  . SIMPLE MASTECTOMY WITH AXILLARY SENTINEL NODE BIOPSY Bilateral 04/02/2014   Procedure: LEFT TOTAL MASTECTOMY WITH LEFT AXILLARY SENTINEL NODE BIOPSY, RIGHT PROPHYLACTIC MASTETCTOMY;  Surgeon: [name], MD;  Location: [place];  Service: General;  Laterality: Bilateral;  . TOTAL ABDOMINAL HYSTERECTOMY W/ BILATERAL SALPINGOOPHORECTOMY  1996  . TRANSTHORACIC ECHOCARDIOGRAM  02-04-2015   grade 1 diastolic dysfunction, ef [id number]  trivial TR  . VESICO-VAGINAL FISTULA REPAIR N/A 07/17/2015   Procedure: REPAIR OF RECTOVAGINAL FISTULA;  Surgeon: [name], MD;  Location: [facility];  Service: Gynecology;  Laterality: N/A;  . VIDEO BRONCHOSCOPY Bilateral 01/16/2013   Procedure: VIDEO BRONCHOSCOPY WITHOUT FLUORO;  Surgeon: [name], MD;  Location: [facility];  Service: Cardiopulmonary;  Laterality: Bilateral;    FAMILY HISTORY: Family History  Problem Relation Age of Onset  . Heart failure Mother   . Colon cancer Father 90    stomach cancer also in 39s  . Throat cancer Brother 19    smoker  . Heart attack Maternal Grandmother   . Colon cancer Paternal Grandmother 5  . Cancer Paternal Grandfather     kidney and bladder  . Ovarian cancer Sister 37    ovarian cancer at 13, colorectal cancer at 40  . Throat cancer Brother 37    throat cancer, smoker  . Breast cancer Paternal Aunt 70  . Ovarian cancer Other 68    niece with ovarian cancer    SOCIAL HISTORY: Social History   Social History  . Marital status: Married    Spouse name: [name]   .  Number of children: 4  . Years of education: GED   Occupational History  .  [employer]   Social History Main Topics  . Smoking status: Former Smoker    Packs/day: 0.00    Years: 37.00    Types: Cigarettes, E-cigarettes    Quit date: 03/29/2014  . Smokeless tobacco: Never Used     Comment: uses vapor occasionally  . Alcohol use No  . Drug use: No  . Sexual activity: Not Currently   Other Topics Concern  . Not on file   Social History Narrative   Patient lives at home with her husband [name]) Patient works full time.   Education- GED   Right handed.   Caffeine- one cup of coffee daily.     PHYSICAL EXAM  Vitals:   04/07/16 1502  BP: 106/75  Pulse: 84  Weight: 166 lb (75.3 kg)  Height: 5\' 6"  (1.676 m)   Body mass index is 26.79 kg/m.  Generalized: Well developed, in no acute distress  Head: normocephalic and atraumatic,. Oropharynx benign  Neck: Supple, no carotid bruits  Cardiac: Regular rate rhythm, no murmur  Musculoskeletal: No deformity   Neurological examination   Mentation: Alert oriented to time, place, history taking. Attention span and concentration appropriate. Recent and remote memory intact.  Follows all commands speech and language fluent.   Cranial nerve II-XII: Pupils were equal round reactive to light extraocular movements were full, visual field were full on confrontational test. Facial sensation and strength were normal. hearing was intact to finger rubbing bilaterally. Uvula tongue midline. head turning and shoulder shrug were normal and symmetric.Tongue protrusion into cheek strength was normal. Motor: normal bulk and tone, full strength in the BUE, BLE, fine finger movements normal, no pronator drift. No focal weakness Sensory: normal and symmetric to light touch, pinprick, and  Vibration, in the upper and lower extremities Coordination: finger-nose-finger, heel-to-shin bilaterally, no dysmetria Reflexes: Brachioradialis 2/2, biceps 2/2,  triceps 2/2, patellar 2/2, Achilles 2/2, plantar responses were flexor bilaterally. Gait and Station: Rising up from seated position without assistance, normal stance,  moderate stride, good arm swing, smooth turning, able to perform tiptoe, and heel walking without difficulty. Tandem gait is steady  DIAGNOSTIC DATA (LABS, IMAGING, TESTING) - I reviewed patient records, labs, notes, testing and imaging myself where available.  Lab Results  Component Value Date   WBC 4.8 10/31/2015   HGB 11.9 10/31/2015   HCT 34.7 (L) 10/31/2015   MCV 90.4 10/31/2015   PLT 224 10/31/2015      Component Value Date/Time   NA 142 10/31/2015 1501   K 3.8 10/31/2015 1501   CL 109 07/11/2015 1600   CO2 23 10/31/2015 1501   GLUCOSE 102 10/31/2015 1501   BUN 10.8 10/31/2015 1501   CREATININE 1.2 (H) 10/31/2015 1501   CALCIUM 9.4 10/31/2015 1501   PROT 7.3 10/31/2015 1501   ALBUMIN 4.1 10/31/2015 1501   AST 21 10/31/2015 1501   ALT 18 10/31/2015 1501   ALKPHOS 111 10/31/2015 1501   BILITOT 0.47 10/31/2015 1501   GFRNONAA 51 (L) 07/11/2015 1600   GFRAA 59 (L) 07/11/2015 1600       ASSESSMENT AND PLAN  55 y.o. year old female  has a past medical history of chronic migraine with multiple preventives in the past along with Botox. Small brain aneurysm repeat MRA in 2016 was without change from MRA in 2014  PLAN: Depakote 500ER  mg at bedtime Imitrex 100 mg by mouth when necessary migraine Zofran when necessary nausea Given a list of migraine triggers Follow-up in 2 months Dennie Bible, [facility], [facility], [facility] [address] [place], [place] [address] [phone]

## 2016-04-07 NOTE — Patient Instructions (Signed)
Depakote 500ER  mg at bedtime Imitrex 100 mg by mouth when necessary migraine Zofran when necessary nausea Given a list of migraine triggers Follow-up in 2 months

## 2016-04-09 NOTE — Progress Notes (Signed)
I have reviewed and agreed above plan. 

## 2016-04-27 ENCOUNTER — Other Ambulatory Visit: Payer: Self-pay | Admitting: *Deleted

## 2016-04-27 DIAGNOSIS — C50412 Malignant neoplasm of upper-outer quadrant of left female breast: Secondary | ICD-10-CM

## 2016-04-28 ENCOUNTER — Other Ambulatory Visit (HOSPITAL_BASED_OUTPATIENT_CLINIC_OR_DEPARTMENT_OTHER): Payer: BLUE CROSS/BLUE SHIELD

## 2016-04-28 ENCOUNTER — Ambulatory Visit (HOSPITAL_BASED_OUTPATIENT_CLINIC_OR_DEPARTMENT_OTHER): Payer: BLUE CROSS/BLUE SHIELD | Admitting: Oncology

## 2016-04-28 VITALS — BP 112/70 | HR 79 | Temp 98.0°F | Resp 18 | Ht 66.0 in | Wt 168.1 lb

## 2016-04-28 DIAGNOSIS — C50412 Malignant neoplasm of upper-outer quadrant of left female breast: Secondary | ICD-10-CM

## 2016-04-28 DIAGNOSIS — K1379 Other lesions of oral mucosa: Secondary | ICD-10-CM | POA: Diagnosis not present

## 2016-04-28 DIAGNOSIS — Z171 Estrogen receptor negative status [ER-]: Secondary | ICD-10-CM | POA: Diagnosis not present

## 2016-04-28 DIAGNOSIS — N183 Chronic kidney disease, stage 3 (moderate): Secondary | ICD-10-CM | POA: Diagnosis not present

## 2016-04-28 LAB — COMPREHENSIVE METABOLIC PANEL
ALBUMIN: 3.8 g/dL (ref 3.5–5.0)
ALK PHOS: 90 U/L (ref 40–150)
ALT: 15 U/L (ref 0–55)
ANION GAP: 9 meq/L (ref 3–11)
AST: 16 U/L (ref 5–34)
BILIRUBIN TOTAL: 0.42 mg/dL (ref 0.20–1.20)
BUN: 14.5 mg/dL (ref 7.0–26.0)
CALCIUM: 8.9 mg/dL (ref 8.4–10.4)
CO2: 22 mEq/L (ref 22–29)
CREATININE: 1 mg/dL (ref 0.6–1.1)
Chloride: 110 mEq/L — ABNORMAL HIGH (ref 98–109)
EGFR: 65 mL/min/{1.73_m2} — ABNORMAL LOW (ref >90)
Glucose: 92 mg/dl (ref 70–140)
Potassium: 4.1 mEq/L (ref 3.5–5.1)
Sodium: 141 mEq/L (ref 136–145)
Total Protein: 6.8 g/dL (ref 6.4–8.3)

## 2016-04-28 LAB — CBC WITH DIFFERENTIAL/PLATELET
BASO%: 0.7 % (ref 0.0–2.0)
BASOS ABS: 0 10*3/uL (ref 0.0–0.1)
EOS ABS: 0 10*3/uL (ref 0.0–0.5)
EOS%: 1.4 % (ref 0.0–7.0)
HEMATOCRIT: 33.6 % — AB (ref 34.8–46.6)
HEMOGLOBIN: 11.6 g/dL (ref 11.6–15.9)
LYMPH%: 31.5 % (ref 14.0–49.7)
MCH: 31.4 pg (ref 25.1–34.0)
MCHC: 34.5 g/dL (ref 31.5–36.0)
MCV: 91.1 fL (ref 79.5–101.0)
MONO#: 0.4 10*3/uL (ref 0.1–0.9)
MONO%: 14.9 % — ABNORMAL HIGH (ref 0.0–14.0)
NEUT#: 1.5 10*3/uL (ref 1.5–6.5)
NEUT%: 51.5 % (ref 38.4–76.8)
PLATELETS: 139 10*3/uL — AB (ref 145–400)
RBC: 3.69 10*6/uL — ABNORMAL LOW (ref 3.70–5.45)
RDW: 13.2 % (ref 11.2–14.5)
WBC: 2.9 10*3/uL — ABNORMAL LOW (ref 3.9–10.3)
lymph#: 0.9 10*3/uL (ref 0.9–3.3)

## 2016-04-28 MED ORDER — VALACYCLOVIR HCL 1 G PO TABS: 1000.0000 mg | ORAL_TABLET | Freq: Two times a day (BID) | ORAL | 4 refills | Status: DC

## 2016-04-28 NOTE — Progress Notes (Signed)
Stacy Fernandez  [phone]) [phone] Fax:([phone]) [phone]     ID: Stacy Fernandez DOB: 1960/10/30  MR#: [id number]  XQJ#:[id number]  Patient Care Team: Pcp Not In System as PCP - General [name], MD (Inactive) as PCP - Family Medicine (Nurse Practitioner) [name], MD as Consulting Physician (General Surgery) [name], MD as Consulting Physician (Oncology) [name], MD as Consulting Physician (Gastroenterology) [name], MD as Consulting Physician (Pulmonary Disease) [name], MD as Consulting Physician (Obstetrics and Gynecology) Judeth Cornfield. 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 She brought it to her primary physician's attention him a and on 02/21/2014 she underwent bilateral diagnostic mammography with tomography at [facility]. The breast composition was category B. In the left breast at the 3:00 position there was an irregular mass associated with skin retraction. Ultrasound performed on the same day confirmed a 1.1 cm irregular solid mass in the left breast at the 3:00 position. There were no abnormalities by sonography in the left axilla or in the superior portion of the breast, which is for the patient experiences some tenderness.  Biopsy of the left breast area in question 02/22/2014 showed ([id number]) and invasive ductal carcinoma, grade 3, estrogen and progesterone receptor negative, with an MIB-1 of 83%, and HER-2 amplified, the signals ratio being 2.07, and a copy number per cell 4.75.  On 02/28/2014 the patient underwent bilateral breast MRI, which showed in  the posterior third of the left breast at the 3:00 position an irregular spiculated mass measuring 1.8 cm. The left breast was unremarkable and there were no abnormal appearing lymph nodes.  The patient's subsequent history is as detailed below  INTERVAL HISTORY: Stacy Fernandez returns today for follow up of her estrogen receptor negative breast cancer, accompanied by her husband Stacy Fernandez. The interval history is generally unremarkable. She has had no problems with her colorectal fistula. She was evaluated by nephrology and generally she is doing "pretty good".  REVIEW OF SYSTEMS:  The biggest problem Stacy Fernandez is experiencing is mouth sores. These come and go but they keep coming back. She has moderate fatigue and some insomnia problems. She feels short of breath only when walking up stairs but gets to the top without stopping. She has stress urinary incontinence. She bruises easily. She had an episode of rectal bleeding which was attributed to diverticular disease. Her migraine medicine has been changed for insurance reasons but seems to be working okay at present. A detailed review of systems today was otherwise stable.  PAST MEDICAL HISTORY: Past Medical History:  Diagnosis Date  . Anxiety   . Breast cancer [facility]) oncologist-  dr Marjie Skiff--  no recurrence per last note   dx 03-08-2014 lef breast DCIS,  Stage 1A (pT1c  pN0),  grade 3 (ER/ PR negative)--  s/p  bilateral mastectomy w/ reconstruction and chemotherapy (05-08-2014 to 04-30-2015)  . Chemotherapy-induced peripheral neuropathy (HCC)    FEET  . 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 CKD (chronic kidney disease), stage III   . COPD with emphysema [facility]) pulmologist--  dr Elsworth Soho   advanded emphysema per  CT   . History of chemotherapy    left breast ca-- 05-09-2015   to 04-30-2015  . History of gastroesophageal reflux (GERD)    no problems since Nissen fundoplication  . Hypothyroidism   . Rectovaginal fistula   . Skin  changes related to chemotherapy    ARMS  . Supraclinoid carotid artery aneurysm, small neurologist-  dr willis/  dr Krista Blue   left internal - 2 mm;  aneurysm  vs.  infundibulum--  residual chronic migraine  . Wears dentures    upper  . Wears glasses     PAST SURGICAL HISTORY: Past Surgical History:  Procedure Laterality Date  . APPENDECTOMY  1992  . BREAST RECONSTRUCTION WITH PLACEMENT OF TISSUE EXPANDER AND FLEX HD (ACELLULAR HYDRATED DERMIS) Bilateral 09/25/2014   Procedure: PLACEMENT OF BILATERAL TISSUE EXPANDER AND  ACELLULAR DERMIS FOR BREAST RECONSTRUCTION ;  Surgeon: [name], MD;  Location: [place];  Service: Plastics;  Laterality: Bilateral;  . CHOLECYSTECTOMY  1999  . ESOPHAGOGASTRODUODENOSCOPY (EGD) WITH PROPOFOL  01/18/2013  . LAPAROSCOPIC NISSEN FUNDOPLICATION  [phone]  . LEFT HEART CATHETERIZATION WITH CORONARY ANGIOGRAM N/A 12/26/2012   Procedure: LEFT HEART CATHETERIZATION WITH CORONARY ANGIOGRAM;  Surgeon: Lorretta Harp, MD;  Location: [facility] CATH LAB;  Service: Cardiovascular;  Laterality: N/A;   Nonobstructive CAD/  40-50% ostial D1/  normal LVF, ef 60%  . LIPOSUCTION WITH LIPOFILLING Bilateral 12/18/2014   Procedure: LIPOFILLING TO BILATERAL CHEST;  Surgeon: [name], MD;  Location: [place];  Service: Plastics;  Laterality: Bilateral;  . PORT-A-CATH REMOVAL Right 06/28/2015   Procedure: REMOVAL PORT-A-CATH;  Surgeon: Leighton Ruff, MD;  Location: [facility];  Service: General;  Laterality: Right;  . PORTACATH PLACEMENT N/A 04/02/2014   Procedure: INSERTION PORT-A-CATH;  Surgeon: [name], MD;  Location: [place];  Service: General;  Laterality: N/A;  . REMOVAL OF BILATERAL TISSUE EXPANDERS WITH PLACEMENT OF BILATERAL BREAST IMPLANTS Bilateral 12/18/2014   Procedure: REMOVAL OF BILATERAL TISSUE EXPANDERS,PLACEMENT SILICONE IMPLANTS ;  Surgeon: [name], MD;  Location: [place];  Service:  Plastics;  Laterality: Bilateral;  . SIMPLE MASTECTOMY WITH AXILLARY SENTINEL NODE BIOPSY Bilateral 04/02/2014   Procedure: LEFT TOTAL MASTECTOMY WITH LEFT AXILLARY SENTINEL NODE BIOPSY, RIGHT PROPHYLACTIC MASTETCTOMY;  Surgeon: [name], MD;  Location: [place];  Service: General;  Laterality: Bilateral;  . TOTAL ABDOMINAL HYSTERECTOMY W/ BILATERAL SALPINGOOPHORECTOMY  1996  . TRANSTHORACIC ECHOCARDIOGRAM  02-04-2015   grade 1 diastolic dysfunction, ef 01-77%/  trivial TR  . VESICO-VAGINAL FISTULA REPAIR N/A 07/17/2015   Procedure: REPAIR OF RECTOVAGINAL FISTULA;  Surgeon: [name], MD;  Location: [facility];  Service: Gynecology;  Laterality: N/A;  . VIDEO BRONCHOSCOPY Bilateral 01/16/2013   Procedure: VIDEO BRONCHOSCOPY WITHOUT FLUORO;  Surgeon: [name], MD;  Location: [facility];  Service: Cardiopulmonary;  Laterality: Bilateral;    FAMILY HISTORY Family History  Problem Relation Age of Onset  . Heart failure Mother   . Colon cancer Father 37    stomach cancer also in 22s  . Throat cancer Brother 35    smoker  . Heart attack Maternal Grandmother   . Colon cancer Paternal Grandmother 69  . Cancer Paternal Grandfather     kidney and bladder  . Ovarian cancer Sister 72    ovarian cancer at 62, colorectal cancer at 58  . Throat cancer Brother 37    throat cancer, smoker  . Breast cancer Paternal Aunt 9  . Ovarian cancer  Other 74    niece with ovarian cancer   the patient's father died at the age of 61 from metastatic stomach cancer the patient's father's mother died from colon cancer at the age of 26. The patient's mother died at the age of 54. The patient had 2 brothers and 2 sisters. One brother died at the age of 69 from throat cancer metastatic to the lung. He was a smoker. A second brother Was diagnosed at age 21 with throat cancer metastatic to lung. He has been given 90 days to live" is not looking good". One sister died from colon cancer metastatic to bone. One  sister died of a drug overdose. There is no history of breast or bearing cancer in the family to the patient's knowledge.  GYNECOLOGIC HISTORY:  No LMP recorded. Patient has had a hysterectomy. Menarche age 59, first live birth age 53, the patient is [place] P4. She underwent hysterectomy with bilateral salpingo-oophorectomy approximately 1990. She took hormone replacement for approximately 2 years.  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 Her (second) husband, Stacy Fernandez, is retired. The patient has 2 sons and 2 daughters from an earlier marriage, all living nearby him a and of course she has 4 stepchildren through her current husband. Altogether they have 19 grandchildren. She attends a local [employer]: Not in place   HEALTH MAINTENANCE: Social History  Substance Use Topics  . Smoking status: Former Smoker    Packs/day: 0.00    Years: 37.00    Types: Cigarettes, E-cigarettes    Quit date: 03/29/2014  . Smokeless tobacco: Never Used     Comment: uses vapor occasionally  . Alcohol use No     Colonoscopy: August 2015  PAP: May 2013  Bone density:  Lipid panel:  Allergies  Allergen Reactions  . Contrast Media [Iodinated Diagnostic Agents] Anaphylaxis  . Prednisone Shortness Of Breath and Swelling    CAN TOLERATE IF GIVEN BENADRYL PRIOR     Current Outpatient Prescriptions  Medication Sig Dispense Refill  . divalproex (DEPAKOTE ER) 500 MG 24 hr tablet Take 1 tablet (500 mg total) by mouth at bedtime. 30 tablet 2  . DUONEB 0.5-2.5 (3) MG/3ML SOLN Inhale 3 mLs into the lungs every 6 (six) hours as needed (for shortness of breath). Reported on 07/08/2015  3  . furosemide (LASIX) 20 MG tablet Take 0.5 tablets (10 mg total) by mouth every morning. 30 tablet 0  . levothyroxine (SYNTHROID, LEVOTHROID) 125 MCG tablet Take 125 mcg by mouth daily  before breakfast.    . montelukast (SINGULAIR) 10 MG tablet Take 10 mg by mouth at bedtime.     . ondansetron (ZOFRAN) 4 MG tablet Take 1 tablet (4 mg total) by mouth every 8 (eight) hours as needed for nausea or vomiting. 30 tablet 6  . potassium chloride SA (KLOR-CON M20) 20 MEQ tablet TAKE 1 TABLET (20 MEQ TOTAL) BY MOUTH ONCE DAY--- TAKES IN AM 30 tablet 1  . PROAIR HFA 108 (90 BASE) MCG/ACT inhaler Inhale 2 puffs into the lungs every 4 (four) hours as needed for wheezing or shortness of breath. Reported on 07/08/2015    . SUMAtriptan (IMITREX) 100 MG tablet Take 1 tablet (100 mg total) by mouth once as needed for migraine. May repeat in 2 hours if headache persists or recurs. 10 tablet 2  . traZODone (  DESYREL) 100 MG tablet TAKE 1 TABLET (100 MG TOTAL) BY MOUTH AT BEDTIME. 30 tablet 1  . valACYclovir (VALTREX) 1000 MG tablet Take 1 tablet (1,000 mg total) by mouth 2 (two) times daily. 90 tablet 4   No current facility-administered medications for this visit.     OBJECTIVE: Middle-aged white woman Who appears stated age  18:   04/28/16 1533  BP: 112/70  Pulse: 79  Resp: 18  Temp: 98 F (36.7 C)     Body mass index is 27.13 kg/m.    ECOG FS:1 - Symptomatic but completely ambulatory   Sclerae unicteric, EOMs intact Oropharynx clear and moist No cervical or supraclavicular adenopathy Lungs no rales or rhonchi Heart regular rate and rhythm Abd soft, nontender, positive bowel sounds MSK no focal spinal tenderness, no upper extremity lymphedema Neuro: nonfocal, well oriented, appropriate affect Breasts: Status post bilateral mastectomies with implant reconstruction. There is no evidence of chest wall recurrence. Both axillae are benign.   LAB RESULTS:  CMP     Component Value Date/Time   NA 141 04/28/2016 1514   K 4.1 04/28/2016 1514   CL 109 07/11/2015 1600   CO2 22 04/28/2016 1514   GLUCOSE 92 04/28/2016 1514   BUN 14.5 04/28/2016 1514   CREATININE 1.0 04/28/2016  1514   CALCIUM 8.9 04/28/2016 1514   PROT 6.8 04/28/2016 1514   ALBUMIN 3.8 04/28/2016 1514   AST 16 04/28/2016 1514   ALT 15 04/28/2016 1514   ALKPHOS 90 04/28/2016 1514   BILITOT 0.42 04/28/2016 1514   GFRNONAA 51 (L) 07/11/2015 1600   GFRAA 59 (L) 07/11/2015 1600    I No results found for: SPEP  Lab Results  Component Value Date   WBC 2.9 (L) 04/28/2016   NEUTROABS 1.5 04/28/2016   HGB 11.6 04/28/2016   HCT 33.6 (L) 04/28/2016   MCV 91.1 04/28/2016   PLT 139 (L) 04/28/2016      Chemistry      Component Value Date/Time   NA 141 04/28/2016 1514   K 4.1 04/28/2016 1514   CL 109 07/11/2015 1600   CO2 22 04/28/2016 1514   BUN 14.5 04/28/2016 1514   CREATININE 1.0 04/28/2016 1514      Component Value Date/Time   CALCIUM 8.9 04/28/2016 1514   ALKPHOS 90 04/28/2016 1514   AST 16 04/28/2016 1514   ALT 15 04/28/2016 1514   BILITOT 0.42 04/28/2016 1514       No results found for: LABCA2  No components found for: LABCA125  No results for input(s): INR in the last 168 hours.  Urinalysis    Component Value Date/Time   COLORURINE YELLOW 02/18/2015 0510   APPEARANCEUR CLOUDY (A) 02/18/2015 0510   LABSPEC 1.018 02/18/2015 0510   LABSPEC 1.010 07/10/2014 1004   PHURINE 5.0 02/18/2015 0510   GLUCOSEU NEGATIVE 02/18/2015 0510   GLUCOSEU Negative 07/10/2014 1004   HGBUR SMALL (A) 02/18/2015 0510   BILIRUBINUR NEGATIVE 02/18/2015 0510   BILIRUBINUR Negative 07/10/2014 1004   KETONESUR NEGATIVE 02/18/2015 0510   PROTEINUR NEGATIVE 02/18/2015 0510   UROBILINOGEN 0.2 02/18/2015 0510   UROBILINOGEN 0.2 07/10/2014 1004   NITRITE NEGATIVE 02/18/2015 0510   LEUKOCYTESUR MODERATE (A) 02/18/2015 0510   LEUKOCYTESUR Negative 07/10/2014 1004    STUDIES: CLINICAL DATA:  Chronic kidney disease stage 3  EXAM: RENAL / URINARY TRACT ULTRASOUND COMPLETE  COMPARISON:  CT abdomen pelvis 04/25/2015  FINDINGS: Right Kidney:  Length: 9.8 cm. Echogenicity within normal  limits. No mass or  hydronephrosis visualized.  Left Kidney:  Length: 9.5 cm. Echogenicity within normal limits. No mass or hydronephrosis visualized.  Bladder:  Appears normal for degree of bladder distention.  IMPRESSION: Negative renal ultrasound   Electronically Signed   By: Franchot Gallo M.D.   On: 09/26/2015 22:06  ASSESSMENT: 55 y.o. BRCA negative [place] woman status post left breast Upper outer quadrant biopsy 02/22/2014 for a clinical T2/T3 NX, stage II or III invasive ductal carcinoma, grade 3, estrogen and progesterone receptor negative, with an MIB-1 of 83%, and HER-2 amplified with a signals a ratio of 2.07and a copy number per cell of 4.75  (1) biopsy of an additional area of enhancement in the left breast 03/08/2014 showed ductal carcinoma in situ, estrogen and progesterone receptor negative.  (2) status post bilateral mastectomies with left sentinel lymph node sampling 04/02/2014, showing:  (a) on the right, benign breast tissue including a single negative lymph node  (b) On  the left, a  pT1c pN0, stage IA invasive ductal carcinoma, grade 3, with negative margins  (3) adjuvant chemotherapy started 05/08/2014, consisting of carboplatin and docetaxel given every 3 weeks x6, together with trastuzumab, with neulasta day 2   (a) docetaxel removed from final cycle 08/21/2014 because of persistent neuropathy symptoms  (4) trastuzumab continued to complete a year (last dose 04/30/2015)  (a) echo 02/04/15 showed a well-preserved ejection fraction  (5) reconstruction with bilateral silicone implants 56/31/4970 ([place])  (6) tobacco abuse: The patient quit smoking 03/30/2014  (7) peripheral neuropathy secondary to chemotherapy: chiefly involving feet  (8) lung nodules resolved on 01/24/15 CT scan  (9) pelvic/ abdominal pain: extensive evaluation shows no evidence of metastatic disease but are consistent with a rectovaginal fistula; s/p repair  07/17/2015  (10) chronic kidney disease level III:  PLAN: Stacy Fernandez is 2 years out from definitive surgery for her estrogen receptor negative breast cancer, with no evidence of disease recurrence. This is very favorable, especially because estrogen receptor negative breast cancers if they are to recur tend to recur early.  She is taking better care of some of her chronic problems, including the kidney issues, and appears to have resolved the rectovaginal fistula issue. When she is not doing as exercising on a regular basis. We did discuss that today.  As far as the mouth sores is concerned, I'm starting her on valacyclovir. I suggested she take it for a month after which she can stop and then resume at any time the mouth sores started to come back. She should always take it for at least a week if she starts it.  She will see me again in one year. She knows to call for any problems that may develop before her next visit.     [name], MD   04/28/2016 7:00 PM

## 2016-05-11 ENCOUNTER — Ambulatory Visit: Payer: BLUE CROSS/BLUE SHIELD | Admitting: Neurology

## 2016-06-03 ENCOUNTER — Ambulatory Visit: Payer: BLUE CROSS/BLUE SHIELD | Admitting: Nurse Practitioner

## 2016-06-04 ENCOUNTER — Encounter: Payer: Self-pay | Admitting: Nurse Practitioner

## 2016-07-11 ENCOUNTER — Other Ambulatory Visit: Payer: Self-pay | Admitting: Nurse Practitioner

## 2016-07-26 ENCOUNTER — Other Ambulatory Visit: Payer: Self-pay | Admitting: Nurse Practitioner

## 2016-07-28 ENCOUNTER — Ambulatory Visit (INDEPENDENT_AMBULATORY_CARE_PROVIDER_SITE_OTHER): Payer: BLUE CROSS/BLUE SHIELD | Admitting: Nurse Practitioner

## 2016-07-28 ENCOUNTER — Telehealth: Payer: Self-pay | Admitting: Nurse Practitioner

## 2016-07-28 ENCOUNTER — Encounter: Payer: Self-pay | Admitting: Nurse Practitioner

## 2016-07-28 VITALS — BP 100/64 | HR 78 | Wt 173.2 lb

## 2016-07-28 DIAGNOSIS — G43019 Migraine without aura, intractable, without status migrainosus: Secondary | ICD-10-CM | POA: Diagnosis not present

## 2016-07-28 MED ORDER — SUMATRIPTAN SUCCINATE 100 MG PO TABS: 100.0000 mg | ORAL_TABLET | Freq: Once | ORAL | 4 refills | Status: DC | PRN

## 2016-07-28 MED ORDER — DIVALPROEX SODIUM ER 500 MG PO TB24: 1000.0000 mg | ORAL_TABLET | Freq: Every day | ORAL | 4 refills | Status: DC

## 2016-07-28 NOTE — Progress Notes (Signed)
[facility]  PATIENT: Stacy Fernandez DOB: [birth year]   REASON FOR VISIT: Follow-up for migraine HISTORY FROM: Patient and husband    HISTORY OF PRESENT ILLNESS:UPDATE 01/09/2018CM Ms. Stacy Fernandez, 56 year old female returns for follow-up. Last seen in September ,she was placed on Depakote for migraine prevention 500 mg at bedtime. In addition she was given Zofran for nausea. She uses Imitrex acutely. She ran out of her Depakote last week having more headaches. She says it was beneficial. Cambia was the most effective acutely but it is cost prohibitive. She returns for reevaluation    UPDATE 09/19/2017CM Ms. Stacy Fernandez, 56 year old female returns for follow-up. She has history of migraines since May 2013. Botox was beneficial for a while. She has tried several preventatives to include propranolol, Topamax, Effexor, without helping. Cambia worked but her insurance would not cover this. Her last headache was Saturday. She has not kept a record of her headaches. She is not aware of any triggers specifically. MRA from last visit was unchanged from 2014. She returns for reevaluation   HISTORY10/6/17 YY [name] is a 56 year old right-handed white female referred by Dr. Jannifer Franklin for Botox injection as migraine prevention, last injection was January 2016  She had frequent headaches since May 2013, she woke up in May, notice left-sided headache, across her left forehead, feel likes that her left eye is going to pop out, she has no visual loss, first severe headache lasts about 2 days, tried numerous over-the-counter medications, including Excedrin Migraine, Tylenol, Aleve without help,  Ever since the initial episode, she has frequent headaches, most on the left side, occasionally on the right side, excruciating pounding headache, with associated light noise sensitivity, nauseous, she often woke up from sleep with headaches  CT head without contrast was normal, MRI cervical showed mild  spondylitic disease, but no acute lesions MRI of the brain done previously was normal. MRA of the head showed small 84mm projection near the left supraclinoid internal carotid artery may represent small aneurysm vs. infundibulum of hypoplastic posterior communicating artery Carotid Doppler study done previously was normal.   Over the past 2 years, she has tried different preventative medications this including propranolol, Topamax, Effexor, without helping, She still has 6 headaches in a week , moderately severe, lasting for few days She received last Botox injection as migraine prevention since Jan 2015, responded very well,  She has double msectomectomy for left  brest cancer, chemotherapy, since last injection in August 2015, she has more frequent headaches during the past few months because of her treatment, Maxalt, Imitrex subcutaneous injection was not as helpful. 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 This is probably incidental, but given the history of some recent bowel incontinence, clinical correlation with any abnormal vaginal discharge is recommended in ruling out anovaginal fistula or fistula-in-ano. Emphysema. Aortoiliac atherosclerotic vascular disease. She will be evaluated by gynecologist soon  She is now complains of frequent left hemisphere headaches, severe, 5 out of 7 days, each headaches last for a few hours to a few days, Maxalt only provide limited help, Imitrex injection works better, but she only gets to injection each months from [facility], Toradol 10 mg as needed only provide limited help.    Previous  Botox injection was helpful She is also very worried about previous MRA  Of  Small 93mm projection of the left supraclinoid internal carotid artery may represent small aneurysm vs. infundibulum. She wants to have repeat MRA to follow up on the progress    REVIEW OF SYSTEMS: Full 14 system review of systems performed and notable only for those listed, all others are neg:  Constitutional: neg Cardiovascular: Leg swelling Ear/Nose/Throat: neg  Skin: neg Eyes: Sensitivity to light Respiratory: neg Gastroitestinal: neg  Hematology/Lymphatic: neg  Endocrine: neg Musculoskeletal: Back pain Allergy/Immunology: neg Neurological: Headache Psychiatric: neg Sleep : Restless legs, snoring   ALLERGIES: Allergies  Allergen Reactions  . Contrast Media [Iodinated Diagnostic Agents] Anaphylaxis  . Prednisone Shortness Of Breath and Swelling    CAN TOLERATE IF GIVEN BENADRYL PRIOR     HOME MEDICATIONS: Outpatient Medications Prior to Visit  Medication Sig Dispense Refill  . divalproex (DEPAKOTE ER) 500 MG 24 hr tablet Take 1 tablet (500 mg total) by mouth at bedtime. 30 tablet 2  . DUONEB 0.5-2.5 (3) MG/3ML SOLN Inhale 3 mLs into the lungs every 6 (six) hours as needed (for shortness of breath). Reported on 07/08/2015  3  . furosemide (LASIX) 20 MG tablet Take 0.5 tablets (10 mg total) by mouth every morning. 30 tablet 0  . levothyroxine (SYNTHROID, LEVOTHROID) 125 MCG tablet Take 125 mcg by mouth daily before breakfast.    . montelukast (SINGULAIR) 10 MG tablet Take 10 mg by mouth at bedtime.     . ondansetron (ZOFRAN) 4 MG tablet Take 1 tablet (4 mg total) by mouth every 8 (eight) hours as needed for nausea or vomiting. 30 tablet 6  . potassium chloride SA (KLOR-CON M20) 20 MEQ tablet TAKE 1 TABLET (20 MEQ TOTAL) BY MOUTH ONCE DAY--- TAKES IN AM 30 tablet 1  . PROAIR HFA 108 (90 BASE) MCG/ACT inhaler Inhale 2 puffs into the lungs every 4 (four) hours as needed for wheezing or shortness of breath. Reported on 07/08/2015    . SUMAtriptan (IMITREX) 100 MG tablet Take 1  tablet (100 mg total) by mouth once as needed for migraine. May repeat in 2 hours if headache persists or recurs. 10 tablet 2  . traZODone (DESYREL) 100 MG tablet TAKE 1 TABLET (100 MG TOTAL) BY MOUTH AT BEDTIME. 30 tablet 1  . valACYclovir (VALTREX) 1000 MG tablet Take 1 tablet (1,000 mg total) by mouth 2 (two) times daily. 90 tablet 4   No facility-administered medications prior to visit.     PAST MEDICAL HISTORY: Past Medical History:  Diagnosis Date  . Anxiety   . 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 Chemotherapy-induced peripheral neuropathy (HCC)    FEET  . 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 CKD (chronic kidney disease), stage III   . COPD with emphysema [facility]) pulmologist--  dr Elsworth Soho   advanded emphysema per CT   . History of chemotherapy    left breast ca-- 05-09-2015   to 04-30-2015  . History of gastroesophageal reflux (GERD)    no problems since Nissen fundoplication  . Hypothyroidism   . Rectovaginal fistula   . Skin changes related to chemotherapy    ARMS  . Supraclinoid  carotid artery aneurysm, small neurologist-  dr willis/  dr Krista Blue   left internal - 2 mm;  aneurysm  vs.  infundibulum--  residual chronic migraine  . Wears dentures    upper  . Wears glasses     PAST SURGICAL HISTORY: Past Surgical History:  Procedure Laterality Date  . APPENDECTOMY  1992  . BREAST RECONSTRUCTION WITH PLACEMENT OF TISSUE EXPANDER AND FLEX HD (ACELLULAR HYDRATED DERMIS) Bilateral 09/25/2014   Procedure: PLACEMENT OF BILATERAL TISSUE EXPANDER AND  ACELLULAR DERMIS FOR BREAST RECONSTRUCTION ;  Surgeon: [name], MD;  Location: [place];  Service: Plastics;  Laterality: Bilateral;  . CHOLECYSTECTOMY  1999  .  ESOPHAGOGASTRODUODENOSCOPY (EGD) WITH PROPOFOL  01/18/2013  . LAPAROSCOPIC NISSEN FUNDOPLICATION  [id number]  . LEFT HEART CATHETERIZATION WITH CORONARY ANGIOGRAM N/A 12/26/2012   Procedure: LEFT HEART CATHETERIZATION WITH CORONARY ANGIOGRAM;  Surgeon: Lorretta Harp, MD;  Location: [facility] CATH LAB;  Service: Cardiovascular;  Laterality: N/A;   Nonobstructive CAD/  40-50% ostial D1/  normal LVF, ef 60%  . LIPOSUCTION WITH LIPOFILLING Bilateral 12/18/2014   Procedure: LIPOFILLING TO BILATERAL CHEST;  Surgeon: [name], MD;  Location: [place];  Service: Plastics;  Laterality: Bilateral;  . PORT-A-CATH REMOVAL Right 06/28/2015   Procedure: REMOVAL PORT-A-CATH;  Surgeon: Leighton Ruff, MD;  Location[facility];  Service: General;  Laterality: Right;  . PORTACATH PLACEMENT N/A 04/02/2014   Procedure: INSERTION PORT-A-CATH;  Surgeon: [name], MD;  Location: [place];  Service: General;  Laterality: N/A;  . REMOVAL OF BILATERAL TISSUE EXPANDERS WITH PLACEMENT OF BILATERAL BREAST IMPLANTS Bilateral 12/18/2014   Procedure: REMOVAL OF BILATERAL TISSUE EXPANDERS,PLACEMENT SILICONE IMPLANTS ;  Surgeon: [name], MD;  Location: [place];  Service: Plastics;  Laterality: Bilateral;  . SIMPLE MASTECTOMY WITH AXILLARY SENTINEL NODE BIOPSY Bilateral 04/02/2014   Procedure: LEFT TOTAL MASTECTOMY WITH LEFT AXILLARY SENTINEL NODE BIOPSY, RIGHT PROPHYLACTIC MASTETCTOMY;  Surgeon: [name], MD;  Location: [place];  Service: General;  Laterality: Bilateral;  . TOTAL ABDOMINAL HYSTERECTOMY W/ BILATERAL SALPINGOOPHORECTOMY  1996  . TRANSTHORACIC ECHOCARDIOGRAM  02-04-2015   grade 1 diastolic dysfunction, ef [id number]  trivial TR  . VESICO-VAGINAL FISTULA REPAIR N/A 07/17/2015   Procedure: REPAIR OF RECTOVAGINAL FISTULA;  Surgeon: [name], MD;  Location: [facility];  Service: Gynecology;  Laterality: N/A;  . VIDEO BRONCHOSCOPY Bilateral 01/16/2013    Procedure: VIDEO BRONCHOSCOPY WITHOUT FLUORO;  Surgeon: [name], MD;  Location: [facility];  Service: Cardiopulmonary;  Laterality: Bilateral;    FAMILY HISTORY: Family History  Problem Relation Age of Onset  . Heart failure Mother   . Colon cancer Father 71    stomach cancer also in 50s  . Throat cancer Brother 63    smoker  . Heart attack Maternal Grandmother   . Colon cancer Paternal Grandmother 52  . Cancer Paternal Grandfather     kidney and bladder  . Ovarian cancer Sister 81    ovarian cancer at 73, colorectal cancer at 14  . Throat cancer Brother 37    throat cancer, smoker  . Breast cancer Paternal Aunt 33  . Ovarian cancer Other 52    niece with ovarian cancer    SOCIAL HISTORY: Social History   Social History  . Marital status: Married    Spouse name: [name]   . Number of children: 4  . Years of education: GED   Occupational History  .  [employer]  Social History Main Topics  . Smoking status: Former Smoker    Packs/day: 0.00    Years: 37.00    Types: Cigarettes, E-cigarettes    Quit date: 03/29/2014  . Smokeless tobacco: Never Used     Comment: 07/28/16 no e cigarettes  . Alcohol use No  . Drug use: No  . Sexual activity: Not Currently   Other Topics Concern  . Not on file   Social History Narrative   Patient lives at home with her husband [name]) Patient works full time.   Education- GED   Right handed.   Caffeine- one cup of coffee daily.     PHYSICAL EXAM  Vitals:   07/28/16 1527  BP: 100/64  Pulse: 78  Weight: 173 lb 3.2 oz (78.6 kg)   Body mass index is 27.96 kg/m.  Generalized: Well developed, in no acute distress  Head: normocephalic and atraumatic,. Oropharynx benign  Neck: Supple, no carotid bruits  Cardiac: Regular rate rhythm, no murmur  Musculoskeletal: No deformity   Neurological examination   Mentation: Alert oriented to time, place, history taking. Attention span and concentration appropriate. Recent  and remote memory intact.  Follows all commands speech and language fluent.   Cranial nerve II-XII: Pupils were equal round reactive to light extraocular movements were full, visual field were full on confrontational test. Facial sensation and strength were normal. hearing was intact to finger rubbing bilaterally. Uvula tongue midline. head turning and shoulder shrug were normal and symmetric.Tongue protrusion into cheek strength was normal. Motor: normal bulk and tone, full strength in the BUE, BLE, fine finger movements normal, no pronator drift. No focal weakness Sensory: normal and symmetric to light touch, pinprick, and  Vibration, in the upper and lower extremities Coordination: finger-nose-finger, heel-to-shin bilaterally, no dysmetria Reflexes: Brachioradialis 2/2, biceps 2/2, triceps 2/2, patellar 2/2, Achilles 2/2, plantar responses were flexor bilaterally. Gait and Station: Rising up from seated position without assistance, normal stance,  moderate stride, good arm swing, smooth turning, able to perform tiptoe, and heel walking without difficulty. Tandem gait is steady  DIAGNOSTIC DATA (LABS, IMAGING, TESTING) - I reviewed patient records, labs, notes, testing and imaging myself where available.  Lab Results  Component Value Date   WBC 2.9 (L) 04/28/2016   HGB 11.6 04/28/2016   HCT 33.6 (L) 04/28/2016   MCV 91.1 04/28/2016   PLT 139 (L) 04/28/2016      Component Value Date/Time   NA 141 04/28/2016 1514   K 4.1 04/28/2016 1514   CL 109 07/11/2015 1600   CO2 22 04/28/2016 1514   GLUCOSE 92 04/28/2016 1514   BUN 14.5 04/28/2016 1514   CREATININE 1.0 04/28/2016 1514   CALCIUM 8.9 04/28/2016 1514   PROT 6.8 04/28/2016 1514   ALBUMIN 3.8 04/28/2016 1514   AST 16 04/28/2016 1514   ALT 15 04/28/2016 1514   ALKPHOS 90 04/28/2016 1514   BILITOT 0.42 04/28/2016 1514   GFRNONAA 51 (L) 07/11/2015 1600   GFRAA 59 (L) 07/11/2015 1600       ASSESSMENT AND PLAN  56 y.o. year  old female  has a past medical history of chronic migraine with multiple preventives in the past along with Botox. She has failed with propranolol, Topamax, Effexor.  Small brain aneurysm repeat MRA in 2016 was without change from MRA in 2014  PLAN:Increase Depakote 500ER  mg to 2 caps at bedtime  Imitrex 100 mg by mouth when necessary migraine will refill Zofran when necessary nausea Follow-up in 4 months,  next with Dr. Krista Blue per request Dennie Bible, [facility], [facility], APRN  [facility] [address], [place], [place] [address] [phone]

## 2016-07-28 NOTE — Telephone Encounter (Signed)
[facility] [phone] called to advise she rec'd today's OV, she said Mingo Amber is the PA but this is not a pt of their's.  FYI

## 2016-07-28 NOTE — Patient Instructions (Addendum)
Increase Depakote 500ER  mg to 2 caps at bedtime  Imitrex 100 mg by mouth when necessary migraine Zofran when necessary nausea Follow-up in 4 months

## 2016-07-29 NOTE — Telephone Encounter (Signed)
LMVM for pt that needed to clarify her pcp (to return call with name, address phone # ) to make sure I have the correct person so as to fax ofv note to her.  May leave info. With operator.

## 2016-07-29 NOTE — Telephone Encounter (Signed)
The note was sent to the person listed in our system as PCP. Please find out who the correct person is and change. Thanksr

## 2016-07-31 NOTE — Progress Notes (Signed)
I have reviewed and agreed above plan. 

## 2016-07-31 NOTE — Telephone Encounter (Signed)
Stacy Bienenstock, MD is pts pcp.  She is part of [facility].  Note in system.  Relayed that pt had been in recently on 07-28-2016 and that note was in system, I spoke to receptionist to let her know.

## 2016-08-05 IMAGING — US US RENAL
1 series · 14 of 25 positions shown · non-contrast
Comparison: CT abdomen pelvis 04/25/2015

CLINICAL DATA: Chronic kidney disease stage 3

EXAM:
RENAL / URINARY TRACT ULTRASOUND COMPLETE

[Series 1: us renal · 0.22mm/px · 14 of 34 slices shown]
[im 1/34]
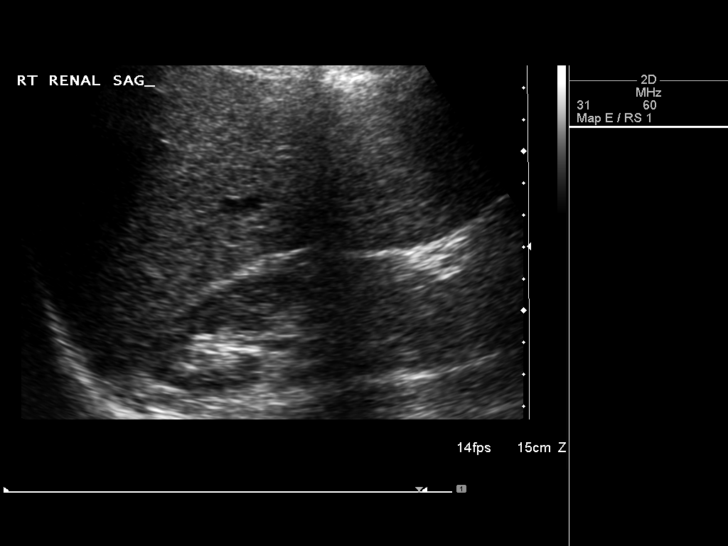
[im 3/34]
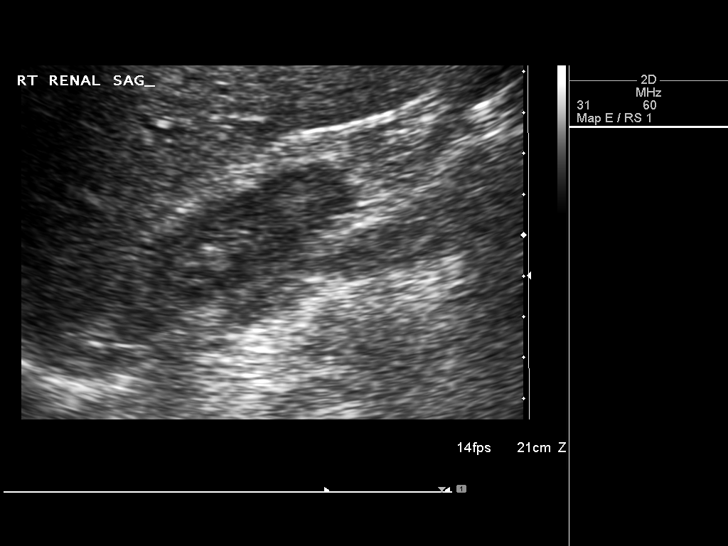
[im 6/34]
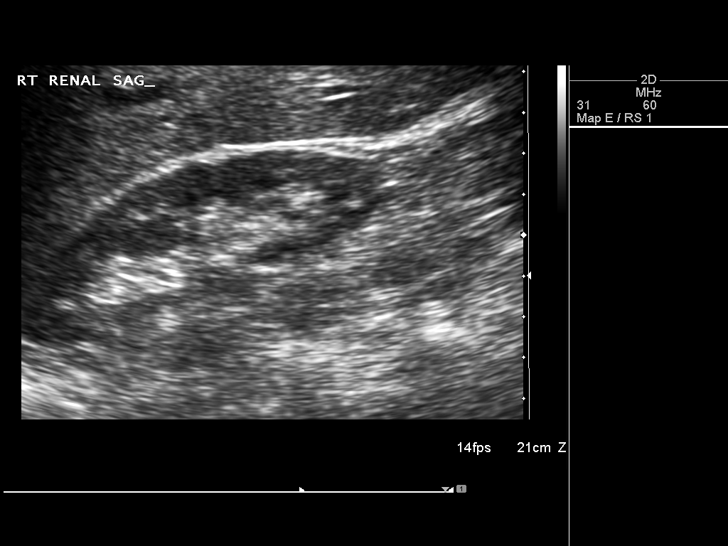
[im 9/34]
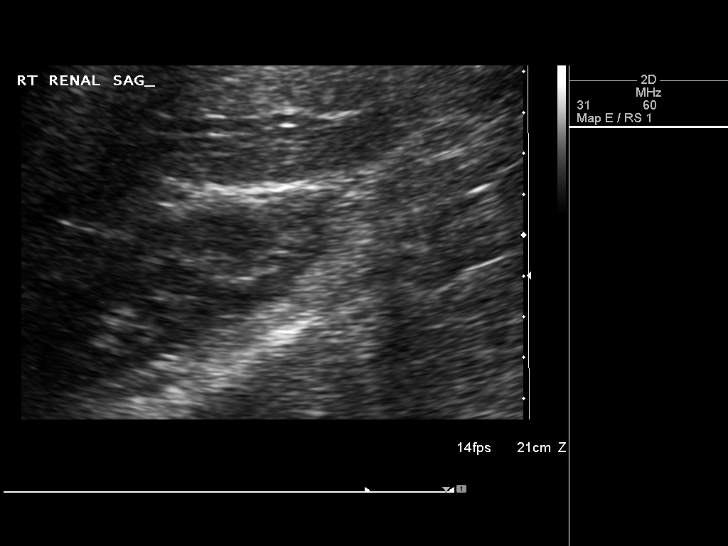
[im 12/34]
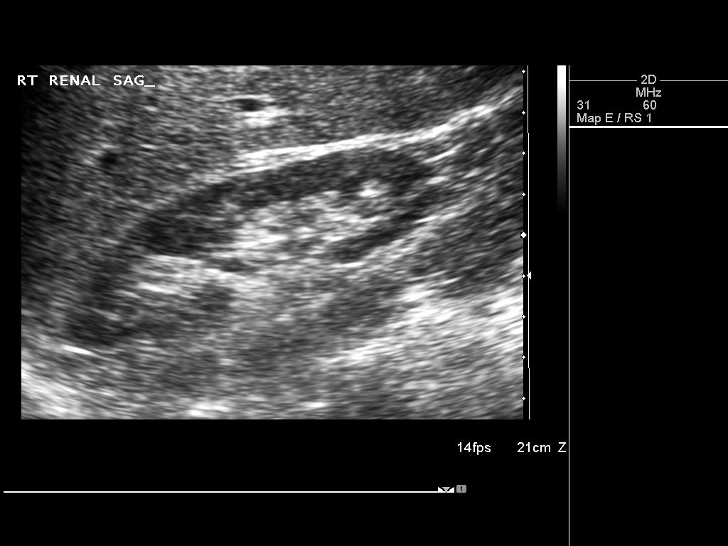
[im 13/34]
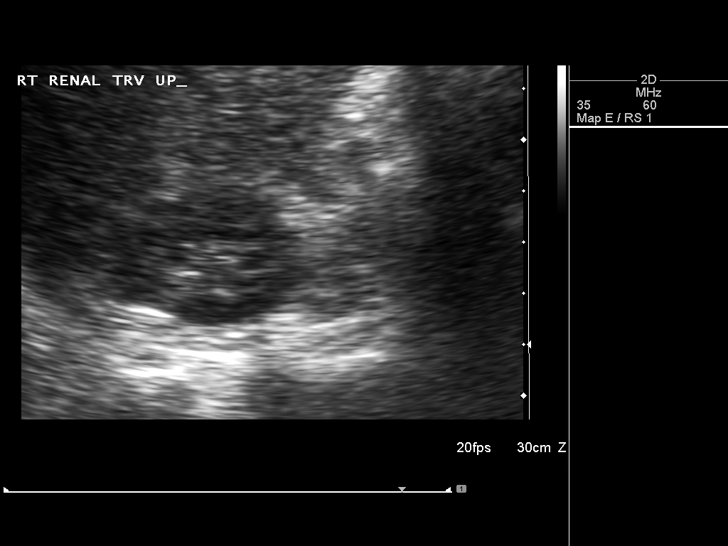
[im 16/34]
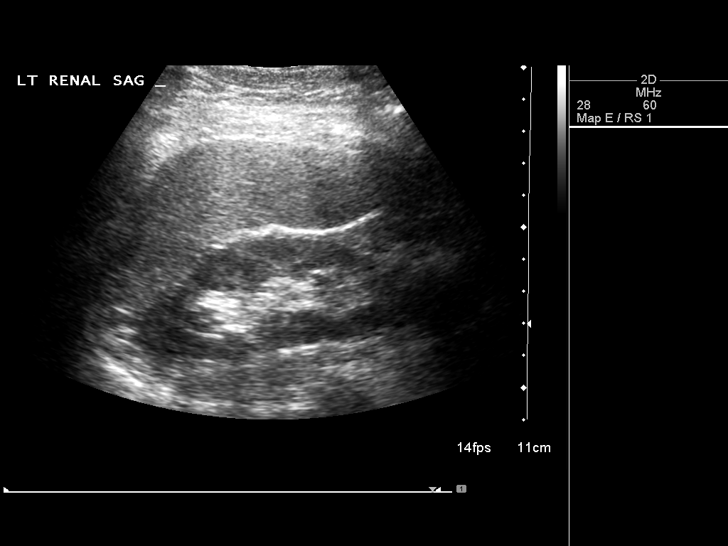
[im 18/34]
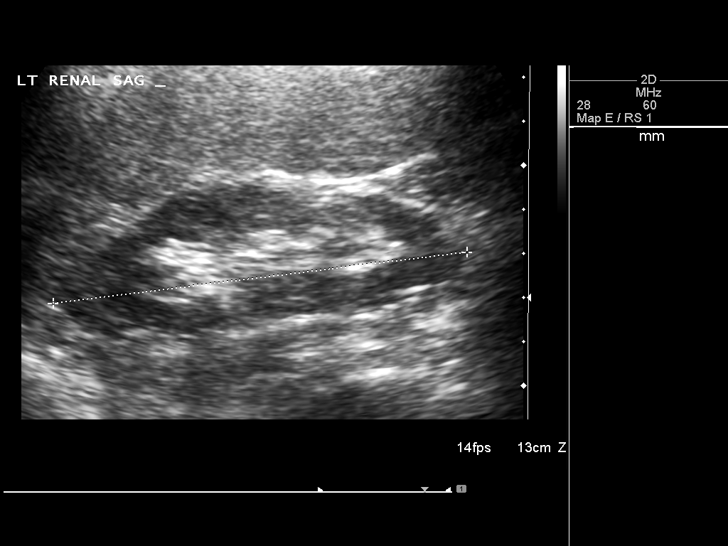
[im 21/34]
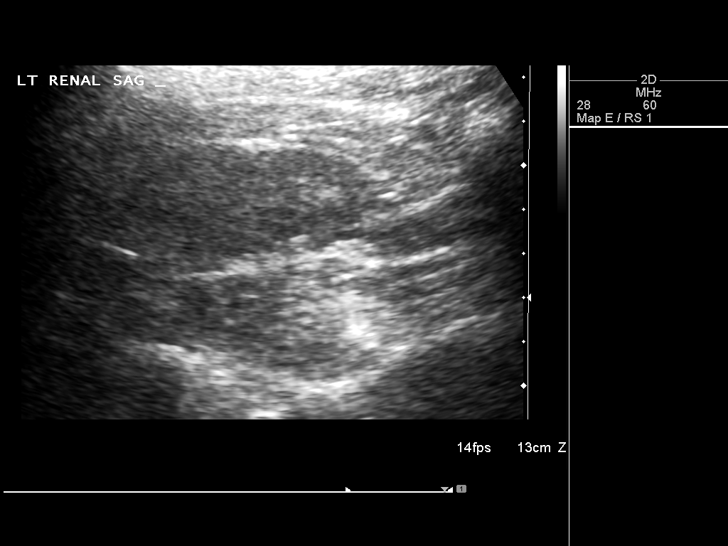
[im 23/34]
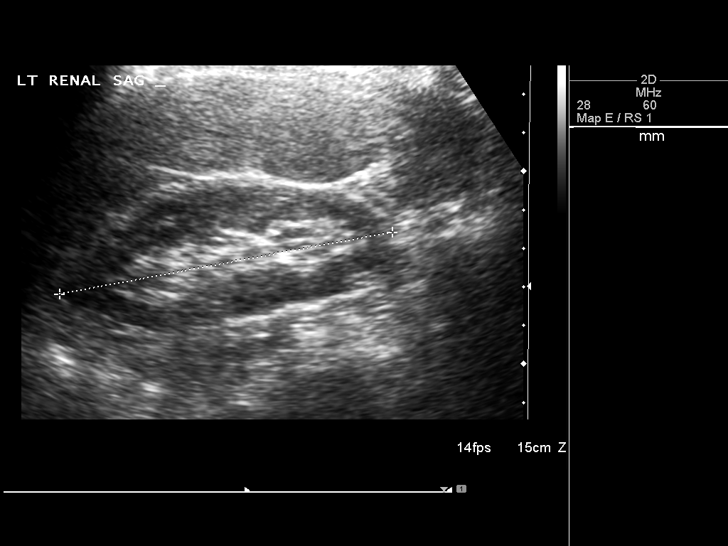
[im 25/34]
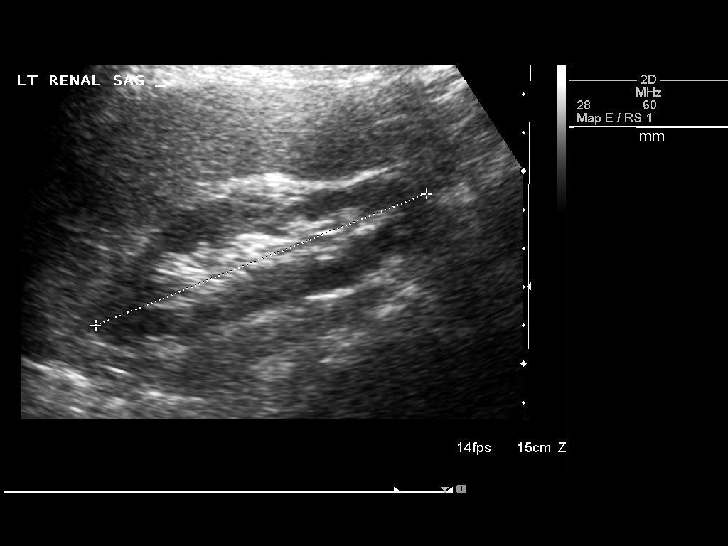
[im 28/34]
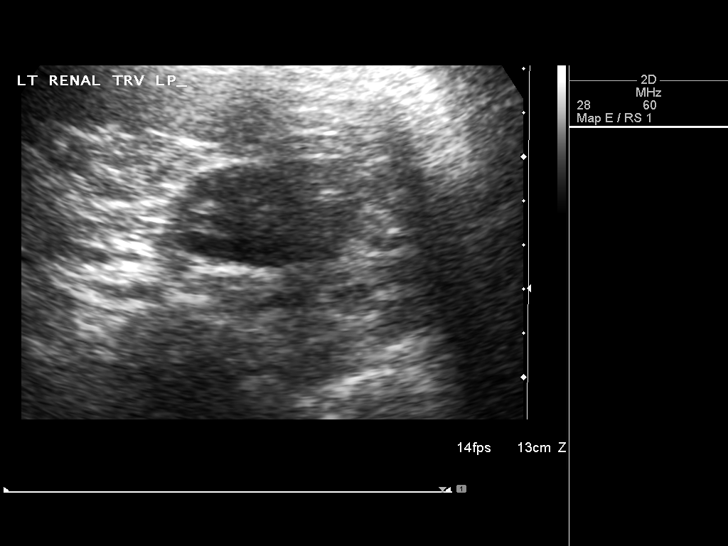
[im 31/34]
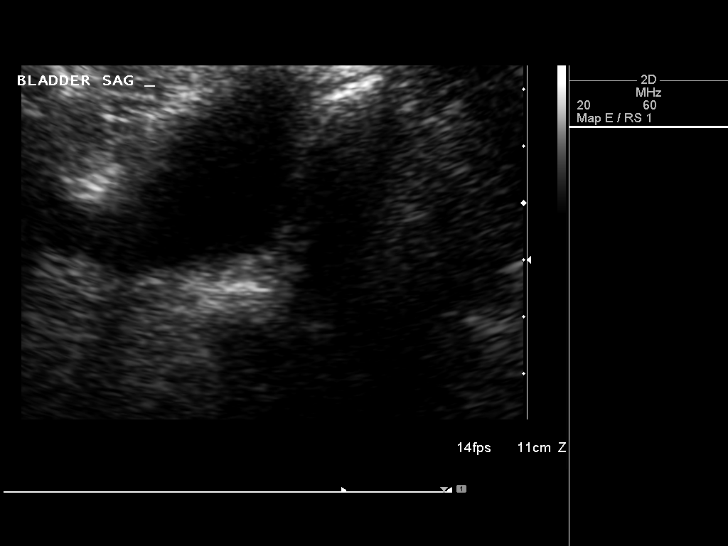
[im 34/34]
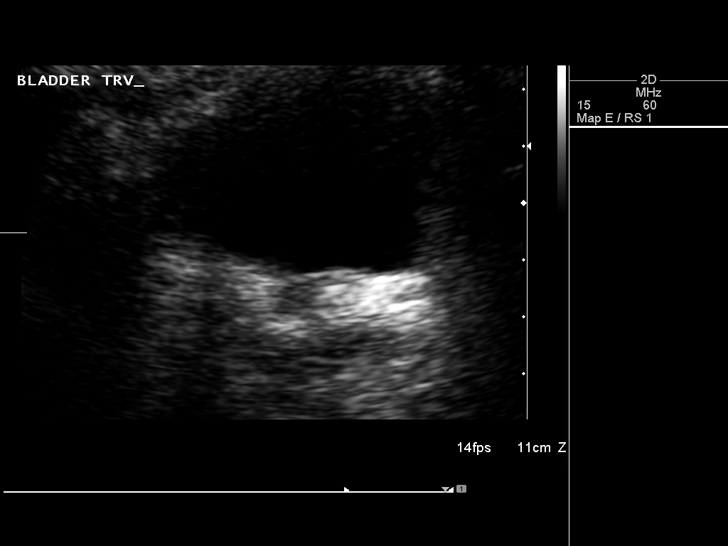

[14 of 25 positions shown; findings below may reference images not displayed]

FINDINGS: Right Kidney:

Length: 9.8 cm. Echogenicity within normal limits. No mass or
hydronephrosis visualized.

Left Kidney:

Length: 9.5 cm. Echogenicity within normal limits. No mass or
hydronephrosis visualized.

Bladder:

Appears normal for degree of bladder distention.
IMPRESSION: Negative renal ultrasound

## 2016-09-24 ENCOUNTER — Telehealth: Payer: Self-pay | Admitting: Nurse Practitioner

## 2016-09-24 NOTE — Telephone Encounter (Signed)
Rn call [facility] to find out if the imitrex needs a PA. The pharmacy rep stated pt insurance is only covering 9 pills per month. The pharmacy stated a new rx needs to be for 9 only. The insurance will no longer pay for 10 plls. Message will be sent to Carolyn(NP) for new rx of 9 pills for imitrex to be sent to [facility] pharmacy.

## 2016-09-24 NOTE — Telephone Encounter (Signed)
Pt advised SUMAtriptan (IMITREX) 100 MG tablet needs PA. She said pharmacy gave her #3 tabs today. Please call

## 2016-09-25 MED ORDER — SUMATRIPTAN SUCCINATE 100 MG PO TABS: 100.0000 mg | ORAL_TABLET | Freq: Once | ORAL | 6 refills | Status: DC | PRN

## 2016-11-30 ENCOUNTER — Ambulatory Visit (INDEPENDENT_AMBULATORY_CARE_PROVIDER_SITE_OTHER): Payer: BLUE CROSS/BLUE SHIELD | Admitting: Neurology

## 2016-11-30 ENCOUNTER — Encounter: Payer: Self-pay | Admitting: Neurology

## 2016-11-30 VITALS — BP 118/75 | HR 78 | Ht 66.0 in | Wt 174.0 lb

## 2016-11-30 DIAGNOSIS — R42 Dizziness and giddiness: Secondary | ICD-10-CM | POA: Diagnosis not present

## 2016-11-30 DIAGNOSIS — Z171 Estrogen receptor negative status [ER-]: Secondary | ICD-10-CM

## 2016-11-30 DIAGNOSIS — G620 Drug-induced polyneuropathy: Secondary | ICD-10-CM | POA: Diagnosis not present

## 2016-11-30 DIAGNOSIS — T451X5A Adverse effect of antineoplastic and immunosuppressive drugs, initial encounter: Secondary | ICD-10-CM

## 2016-11-30 DIAGNOSIS — G43019 Migraine without aura, intractable, without status migrainosus: Secondary | ICD-10-CM | POA: Diagnosis not present

## 2016-11-30 DIAGNOSIS — C50412 Malignant neoplasm of upper-outer quadrant of left female breast: Secondary | ICD-10-CM | POA: Diagnosis not present

## 2016-11-30 MED ORDER — SUMATRIPTAN SUCCINATE 100 MG PO TABS: 100.0000 mg | ORAL_TABLET | Freq: Once | ORAL | 11 refills | Status: DC | PRN

## 2016-11-30 MED ORDER — DICLOFENAC POTASSIUM(MIGRAINE) 50 MG PO PACK: 50.0000 mg | PACK | ORAL | 11 refills | Status: DC | PRN

## 2016-11-30 MED ORDER — NORTRIPTYLINE HCL 25 MG PO CAPS: 50.0000 mg | ORAL_CAPSULE | Freq: Every day | ORAL | 11 refills | Status: DC

## 2016-11-30 NOTE — Progress Notes (Signed)
[facility]  PATIENT: Stacy Fernandez DOB: February 11, 1961   REASON FOR VISIT: Follow-up for migraine HISTORY FROM: Patient and husband  HISTORY10/6/17 Stacy Fernandez is a 56 year old right-handed  female referred by Dr. Jannifer Fernandez for Botox injection as migraine prevention, last injection was January 2016  She had frequent headaches since May 2013, she woke up in May, notice left-sided headache, across her left forehead, feel likes that her left eye is going to pop out, she has no visual loss, first severe headache lasts about 2 days, tried numerous over-the-counter medications, including Excedrin Migraine, Tylenol, Aleve without help,  Ever since the initial episode, she has frequent headaches, most on the left side, occasionally on the right side, excruciating pounding headache, with associated light noise sensitivity, nauseous, she often woke up from sleep with headaches  CT head without contrast was normal, MRI cervical showed mild spondylitic disease, but no acute lesions MRI of the brain in 2014 was normal. MRA of the head showed small 2mm projection near the left supraclinoid internal carotid artery may represent small aneurysm vs. infundibulum of hypoplastic posterior communicating artery Carotid Doppler study done previously was normal.   Over the past 2 years, she has tried different preventative medications this including propranolol, Topamax, Effexor, without helping, She still has 6 headaches in a week , moderately severe, lasting for few days She received last Botox injection as migraine prevention since Jan 2015, responded very well,  She has double msectomectomy for left  brest cancer, chemotherapy, since last injection in August 2015, she has more frequent headaches during the past few months because of her treatment, Maxalt, Imitrex subcutaneous injection was not as helpful. 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 This is probably incidental, but given the history of some recent bowel incontinence, clinical correlation with any abnormal vaginal discharge is recommended in ruling out anovaginal fistula or fistula-in-ano. She also has history of emphysema. Aortoiliac atherosclerotic vascular disease. She will be evaluated by gynecologist soon  She is now complains of frequent left hemisphere headaches, severe, 5 out of 7 days, each headaches last for a few hours to a few days, Maxalt only provide limited help, Imitrex injection works better, but she only gets to injection each months from [facility], Toradol 10 mg as needed only provide limited help.    Previous Botox injection was helpful, last BOTOX was in 2017.   She had left breast cancer, had double vasectomy followed by chemotherapy that was finished in October of 2017, in doing that period of time she had frequent migraine headaches despite Botox injection  She is now having migraine daily, she often woke up with a migraine headache, left retro-orbital area severe pounding headache with associated light noise sensitivity, nauseous, movements make it worse and she also noticed stiffness in her neck, she also had intermittent vertigo with sudden positional change recently.  I was able to review laboratory evaluation in October 2017, decreased WBC 2.9, hemoglobin was 11 point 6, platelets gait was mildly decreased 139, CMP was within normal limit, creatinine was 1.0,  I was able to review and summarize most recent oncologist note from Dr. 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, grade 3, estrogen and progesterone receptor negative, status post bilateral mastectomies with  left sentinel lymph node sampling on April 02 2014, on the left with negative margins, adjuvant therapy started May 09 2835, complicated by peripheral neuropathy secondary to chemotherapy,  I have personally reviewed MRI a of the brain in October 2016, small 2 mm projection of the left supraclinoid internal carotid artery, may represent infundibulum versus small aneurysm, Previously she has tried multiple preventative medications include propanolol, Topamax, Effexor without helping her headaches,   Sometimes she woke up from deep sleep, with severe headaches, despite taking Depakote ER 500 mg 2 tablets at nighttime,  She also complains of excessive snoring, choking during her sleep, excessive daytime sleepiness,  REVIEW OF SYSTEMS: Full 14 system review of systems performed and notable only for those listed, all others are neg: Eye pain, cough, shortness of breath, leg swelling, blood in urine, frequent waking Main, snoring, dizziness, headaches, bruise easily    ALLERGIES: Allergies  Allergen Reactions  . Contrast Media [Iodinated Diagnostic Agents] Anaphylaxis  . Prednisone Shortness Of Breath and Swelling    CAN TOLERATE IF GIVEN BENADRYL PRIOR     HOME MEDICATIONS: Outpatient Medications Prior to Visit  Medication Sig Dispense Refill  . divalproex (DEPAKOTE ER) 500 MG 24 hr tablet Take 2 tablets (1,000 mg total) by mouth at bedtime. 60 tablet 4  . DUONEB 0.5-2.5 (3) MG/3ML SOLN Inhale 3 mLs into the lungs every 6 (six) hours as needed (for shortness of breath). Reported on 07/08/2015  3  . furosemide (LASIX) 20 MG tablet Take 0.5 tablets (10 mg total) by mouth every morning. 30 tablet 0  . levothyroxine (SYNTHROID, LEVOTHROID) 125 MCG tablet Take 125 mcg by mouth daily before breakfast.    . montelukast (SINGULAIR) 10 MG tablet Take 10 mg by mouth at bedtime.     . ondansetron (ZOFRAN) 4 MG tablet Take 1 tablet (4 mg total) by mouth every 8 (eight) hours as needed for nausea  or vomiting. 30 tablet 6  . potassium chloride SA (KLOR-CON M20) 20 MEQ tablet TAKE 1 TABLET (20 MEQ TOTAL) BY MOUTH ONCE DAY--- TAKES IN AM 30 tablet 1  . PROAIR HFA 108 (90 BASE) MCG/ACT inhaler Inhale 2 puffs into the lungs every 4 (four) hours as needed for wheezing or shortness of breath. Reported on 07/08/2015    . SUMAtriptan (IMITREX) 100 MG tablet Take 1 tablet (100 mg total) by mouth once as needed for migraine. May repeat in 2 hours if headache persists or recurs. 9 tablet 6  . traZODone (DESYREL) 100 MG tablet TAKE 1 TABLET (100 MG TOTAL) BY MOUTH AT BEDTIME. 30 tablet 1   No facility-administered medications prior to visit.     PAST MEDICAL HISTORY: Past Medical History:  Diagnosis Date  . Anxiety   . 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 Chemotherapy-induced peripheral neuropathy (HCC)    FEET  . 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 CKD (chronic kidney disease), stage III   . COPD with emphysema [facility]) pulmologist--  dr Elsworth Soho   advanded emphysema per CT   . History of chemotherapy    left breast ca-- 05-09-2015   to 04-30-2015  .  History of gastroesophageal reflux (GERD)    no problems since Nissen fundoplication  . Hypothyroidism   . Rectovaginal fistula   . Skin changes related to chemotherapy    ARMS  . Supraclinoid carotid artery aneurysm, small neurologist-  dr willis/  dr Krista Blue   left internal - 2 mm;  aneurysm  vs.  infundibulum--  residual chronic migraine  . Wears dentures    upper  . Wears glasses     PAST SURGICAL HISTORY: Past Surgical History:  Procedure Laterality Date  . APPENDECTOMY  1992  . BREAST RECONSTRUCTION WITH PLACEMENT OF TISSUE EXPANDER AND FLEX HD (ACELLULAR HYDRATED DERMIS) Bilateral 09/25/2014    Procedure: PLACEMENT OF BILATERAL TISSUE EXPANDER AND  ACELLULAR DERMIS FOR BREAST RECONSTRUCTION ;  Surgeon: [name], MD;  Location: [place];  Service: Plastics;  Laterality: Bilateral;  . CHOLECYSTECTOMY  1999  . ESOPHAGOGASTRODUODENOSCOPY (EGD) WITH PROPOFOL  01/18/2013  . LAPAROSCOPIC NISSEN FUNDOPLICATION  [phone]  . LEFT HEART CATHETERIZATION WITH CORONARY ANGIOGRAM N/A 12/26/2012   Procedure: LEFT HEART CATHETERIZATION WITH CORONARY ANGIOGRAM;  Surgeon: Lorretta Harp, MD;  Location: [facility] CATH LAB;  Service: Cardiovascular;  Laterality: N/A;   Nonobstructive CAD/  40-50% ostial D1/  normal LVF, ef 60%  . LIPOSUCTION WITH LIPOFILLING Bilateral 12/18/2014   Procedure: LIPOFILLING TO BILATERAL CHEST;  Surgeon: [name], MD;  Location: [place];  Service: Plastics;  Laterality: Bilateral;  . PORT-A-CATH REMOVAL Right 06/28/2015   Procedure: REMOVAL PORT-A-CATH;  Surgeon: Leighton Ruff, MD;  Location: [facility];  Service: General;  Laterality: Right;  . PORTACATH PLACEMENT N/A 04/02/2014   Procedure: INSERTION PORT-A-CATH;  Surgeon: [name], MD;  Location: [place];  Service: General;  Laterality: N/A;  . REMOVAL OF BILATERAL TISSUE EXPANDERS WITH PLACEMENT OF BILATERAL BREAST IMPLANTS Bilateral 12/18/2014   Procedure: REMOVAL OF BILATERAL TISSUE EXPANDERS,PLACEMENT SILICONE IMPLANTS ;  Surgeon: [name], MD;  Location: [place];  Service: Plastics;  Laterality: Bilateral;  . SIMPLE MASTECTOMY WITH AXILLARY SENTINEL NODE BIOPSY Bilateral 04/02/2014   Procedure: LEFT TOTAL MASTECTOMY WITH LEFT AXILLARY SENTINEL NODE BIOPSY, RIGHT PROPHYLACTIC MASTETCTOMY;  Surgeon: [name], MD;  Location: [place];  Service: General;  Laterality: Bilateral;  . TOTAL ABDOMINAL HYSTERECTOMY W/ BILATERAL SALPINGOOPHORECTOMY  1996  . TRANSTHORACIC ECHOCARDIOGRAM  02-04-2015   grade 1 diastolic dysfunction, ef 51-76%/   trivial TR  . VESICO-VAGINAL FISTULA REPAIR N/A 07/17/2015   Procedure: REPAIR OF RECTOVAGINAL FISTULA;  Surgeon: [name], MD;  Location: [facility];  Service: Gynecology;  Laterality: N/A;  . VIDEO BRONCHOSCOPY Bilateral 01/16/2013   Procedure: VIDEO BRONCHOSCOPY WITHOUT FLUORO;  Surgeon: [name], MD;  Location: [facility];  Service: Cardiopulmonary;  Laterality: Bilateral;    FAMILY HISTORY: Family History  Problem Relation Age of Onset  . Heart failure Mother   . Colon cancer Father 40       stomach cancer also in 22s  . Throat cancer Brother 59       smoker  . Heart attack Maternal Grandmother   . Colon cancer Paternal Grandmother 33  . Cancer Paternal Grandfather        kidney and bladder  . Ovarian cancer Sister 43       ovarian cancer at 61, colorectal cancer at 13  . Throat cancer Brother 37       throat cancer, smoker  . Breast cancer Paternal Aunt 26  . Ovarian cancer Other 37  niece with ovarian cancer    SOCIAL HISTORY: Social History   Social History  . Marital status: Married    Spouse name: [name]   . Number of children: 4  . Years of education: GED   Occupational History  .  [employer]   Social History Main Topics  . Smoking status: Former Smoker    Packs/day: 0.00    Years: 37.00    Types: Cigarettes, E-cigarettes    Quit date: 03/29/2014  . Smokeless tobacco: Never Used     Comment: 07/28/16 no e cigarettes  . Alcohol use No  . Drug use: No  . Sexual activity: Not Currently   Other Topics Concern  . Not on file   Social History Narrative   Patient lives at home with her husband [name]) Patient works full time.   Education- GED   Right handed.   Caffeine- one cup of coffee daily.     PHYSICAL EXAM  Vitals:   11/30/16 1610  BP: 118/75  Pulse: 78  Weight: 174 lb (78.9 kg)  Height: 5\' 6"  (1.676 m)   Body mass index is 28.08 kg/m.  Generalized: Well developed, in no acute distress  Head: normocephalic and  atraumatic,. Oropharynx benign  Neck: Supple, no carotid bruits  Cardiac: Regular rate rhythm, no murmur  Musculoskeletal: No deformity   Neurological examination   Mentation: Alert oriented to time, place, history taking. Attention span and concentration appropriate. Recent and remote memory intact.  Follows all commands speech and language fluent.   Cranial nerve II-XII: Pupils were equal round reactive to light extraocular movements were full, visual field were full on confrontational test. Facial sensation and strength were normal. hearing was intact to finger rubbing bilaterally. Uvula tongue midline. head turning and shoulder shrug were normal and symmetric.Tongue protrusion into cheek strength was normal. Motor: normal bulk and tone, full strength in the BUE, BLE, fine finger movements normal, no pronator drift. No focal weakness Sensory: normal and symmetric to light touch, pinprick, and  Vibration, in the upper and lower extremities Coordination: finger-nose-finger, heel-to-shin bilaterally, no dysmetria Reflexes: Brachioradialis 2/2, biceps 2/2, triceps 2/2, patellar 2/2, Achilles 2/2, plantar responses were flexor bilaterally. Gait and Station: Rising up from seated position without assistance, normal stance,  moderate stride, good arm swing, smooth turning, able to perform tiptoe, and heel walking without difficulty. Tandem gait is steady  DIAGNOSTIC DATA (LABS, IMAGING, TESTING) - I reviewed patient records, labs, notes, testing and imaging myself where available.  Lab Results  Component Value Date   WBC 2.9 (L) 04/28/2016   HGB 11.6 04/28/2016   HCT 33.6 (L) 04/28/2016   MCV 91.1 04/28/2016   PLT 139 (L) 04/28/2016      Component Value Date/Time   NA 141 04/28/2016 1514   K 4.1 04/28/2016 1514   CL 109 07/11/2015 1600   CO2 22 04/28/2016 1514   GLUCOSE 92 04/28/2016 1514   BUN 14.5 04/28/2016 1514   CREATININE 1.0 04/28/2016 1514   CALCIUM 8.9 04/28/2016 1514   PROT  6.8 04/28/2016 1514   ALBUMIN 3.8 04/28/2016 1514   AST 16 04/28/2016 1514   ALT 15 04/28/2016 1514   ALKPHOS 90 04/28/2016 1514   BILITOT 0.42 04/28/2016 1514   GFRNONAA 51 (L) 07/11/2015 1600   GFRAA 59 (L) 07/11/2015 1600    ASSESSMENT AND PLAN  56 y.o. year old female   History of breast cancer Chronic migraine headaches, progressive worsening Snoring, possible obstructive sleep apnea  Laboratory  evaluations including CMP today  MRI of the brain with and without contrast to rule out structural lesion  Refer her to sleep study  Depakote ER 500 mg, plus nortriptyline 25 mg titrating to 50 mg every night as preventative medications  Preauthorization for Botox injection as migraine prevention  Imitrex, and [place] as needed  Marcial Pacas, M.D. Ph.D.  [facility] [place], [place] [address] Phone: [phone] Fax:      [phone]

## 2016-12-01 LAB — COMPREHENSIVE METABOLIC PANEL
A/G RATIO: 2 (ref 1.2–2.2)
ALT: 17 IU/L (ref 0–32)
AST: 21 IU/L (ref 0–40)
Albumin: 4.6 g/dL (ref 3.5–5.5)
Alkaline Phosphatase: 86 IU/L (ref 39–117)
BILIRUBIN TOTAL: 0.3 mg/dL (ref 0.0–1.2)
BUN/Creatinine Ratio: 19 (ref 9–23)
BUN: 19 mg/dL (ref 6–24)
CHLORIDE: 108 mmol/L — AB (ref 96–106)
CO2: 20 mmol/L (ref 18–29)
Calcium: 9.1 mg/dL (ref 8.7–10.2)
Creatinine, Ser: 1 mg/dL (ref 0.57–1.00)
GFR calc Af Amer: 73 mL/min/{1.73_m2} (ref >59)
GFR calc non Af Amer: 64 mL/min/{1.73_m2} (ref >59)
GLOBULIN, TOTAL: 2.3 g/dL (ref 1.5–4.5)
Glucose: 84 mg/dL (ref 65–99)
POTASSIUM: 4.3 mmol/L (ref 3.5–5.2)
SODIUM: 142 mmol/L (ref 134–144)
Total Protein: 6.9 g/dL (ref 6.0–8.5)

## 2016-12-02 ENCOUNTER — Telehealth: Payer: Self-pay | Admitting: *Deleted

## 2016-12-02 NOTE — Telephone Encounter (Signed)
[place] PA approved by [employer] [phone]) through 12/02/17.  Case [id number] - Valid through 12/02/17.

## 2016-12-04 ENCOUNTER — Ambulatory Visit (INDEPENDENT_AMBULATORY_CARE_PROVIDER_SITE_OTHER): Payer: BLUE CROSS/BLUE SHIELD | Admitting: Pulmonary Disease

## 2016-12-04 ENCOUNTER — Encounter: Payer: Self-pay | Admitting: Pulmonary Disease

## 2016-12-04 DIAGNOSIS — J449 Chronic obstructive pulmonary disease, unspecified: Secondary | ICD-10-CM

## 2016-12-04 DIAGNOSIS — R911 Solitary pulmonary nodule: Secondary | ICD-10-CM

## 2016-12-04 NOTE — Patient Instructions (Addendum)
Sample of [facility] Call us for prescription of this works. Use albuterol MDI only as needed basis  Call us if  breathing is worse

## 2016-12-04 NOTE — Assessment & Plan Note (Signed)
Resolved, no further imaging required

## 2016-12-04 NOTE — Assessment & Plan Note (Signed)
Sample of [facility] Call us for prescription if this works. Use albuterol MDI only as needed basis

## 2016-12-04 NOTE — Progress Notes (Signed)
   Subjective:    Patient ID: Stacy Fernandez, female    DOB: 06/26/1961, 56 y.o.   MRN: [id number]  HPI  56 year old heavy ex-smoker for FU of COPD Her sister was [name] She smoked more than 30-pack-years and quit in 03/2014. She underwent prolonged chemotherapy and hormonal therapy for breast cancer. She also developed pneumonia during this.  She has a history of Nissen's fundoplication in 7062   She reports worsening breathing during the summer and she is to take albuterol MDI more often about to 3 times a day. This seems to settle down in the winter months I reviewed her last imaging studies, CT chest in 2014 had suggested endobronchial nodule in the right mainstem, bronchoscopy was nondiagnostic, follow-up CT chest in 2016 showed resolution of these nodules, also PET scan in 06/2015 was negative  She has severe migraines and is undergoing neurological evaluation including MRI and sleep study is scheduled. Husband reports loud snoring  Significant tests/ events  CPET 11/2012 showed mildly reduced functional status with O2 of 76% predicted.  Spirometry  11/2012 -moderate airway obstruction with FEV1 of 58% FVC of 71% and ratio 65, diffusion was 38%   Spirometry 11/2014  ratio 65, FEV1 61%, FVC 75%    Review of Systems neg for any significant sore throat, dysphagia, itching, sneezing, nasal congestion or excess/ purulent secretions, fever, chills, sweats, unintended wt loss, pleuritic or exertional cp, hempoptysis, orthopnea pnd or change in chronic leg swelling. Also denies presyncope, palpitations, heartburn, abdominal pain, nausea, vomiting, diarrhea or change in bowel or urinary habits, dysuria,hematuria, rash, arthralgias, visual complaints, headache, numbness weakness or ataxia.     Objective:   Physical Exam   Gen. Pleasant, well-nourished, in no distress ENT - no thrush, no post nasal drip Neck: No JVD, no thyromegaly, no carotid bruits Lungs: no use of  accessory muscles, no dullness to percussion, clear without rales or rhonchi  Cardiovascular: Rhythm regular, heart sounds  normal, no murmurs or gallops, no peripheral edema Musculoskeletal: No deformities, no cyanosis or clubbing         Assessment & Plan:

## 2016-12-16 ENCOUNTER — Telehealth: Payer: Self-pay | Admitting: Neurology

## 2016-12-16 ENCOUNTER — Other Ambulatory Visit: Payer: BLUE CROSS/BLUE SHIELD

## 2016-12-16 DIAGNOSIS — R51 Headache: Principal | ICD-10-CM

## 2016-12-16 DIAGNOSIS — R519 Headache, unspecified: Secondary | ICD-10-CM

## 2016-12-16 NOTE — Telephone Encounter (Signed)
Noted, thank you

## 2016-12-16 NOTE — Telephone Encounter (Signed)
MRI brain w/wo

## 2016-12-16 NOTE — Telephone Encounter (Signed)
When you get a chance can you put a new MRI Brain order in because the accession numbers was linked to our [place] mobile unit and she wants to have her MRI now at [facility] and GI has to have a new accession number to schedule it

## 2016-12-17 ENCOUNTER — Telehealth: Payer: Self-pay | Admitting: Neurology

## 2016-12-17 NOTE — Telephone Encounter (Signed)
I called the patient to schedule botox injection. She did not answer so I left a VM asking her to call me back.

## 2016-12-30 ENCOUNTER — Ambulatory Visit
Admission: RE | Admit: 2016-12-30 | Discharge: 2016-12-30 | Disposition: A | Discharge status: Home / Self Care | Payer: BLUE CROSS/BLUE SHIELD | Source: Ambulatory Visit | Attending: Neurology | Admitting: Neurology

## 2016-12-30 DIAGNOSIS — R519 Headache, unspecified: Secondary | ICD-10-CM

## 2016-12-30 DIAGNOSIS — R51 Headache: Principal | ICD-10-CM

## 2016-12-30 MED ORDER — GADOBENATE DIMEGLUMINE 529 MG/ML IV SOLN
16.0000 mL | Freq: Once | INTRAVENOUS | Status: AC | PRN
  Administered 2016-12-30: 16 mL via INTRAVENOUS

## 2017-01-05 ENCOUNTER — Telehealth: Payer: Self-pay | Admitting: Pulmonary Disease

## 2017-01-05 NOTE — Telephone Encounter (Signed)
Left message for patient to call back  

## 2017-01-06 ENCOUNTER — Ambulatory Visit (INDEPENDENT_AMBULATORY_CARE_PROVIDER_SITE_OTHER): Payer: BLUE CROSS/BLUE SHIELD | Admitting: Neurology

## 2017-01-06 ENCOUNTER — Encounter: Payer: Self-pay | Admitting: Neurology

## 2017-01-06 VITALS — BP 123/85 | HR 80 | Ht 66.5 in | Wt 176.0 lb

## 2017-01-06 DIAGNOSIS — G2581 Restless legs syndrome: Secondary | ICD-10-CM | POA: Diagnosis not present

## 2017-01-06 DIAGNOSIS — R0683 Snoring: Secondary | ICD-10-CM | POA: Diagnosis not present

## 2017-01-06 DIAGNOSIS — J449 Chronic obstructive pulmonary disease, unspecified: Secondary | ICD-10-CM

## 2017-01-06 DIAGNOSIS — R351 Nocturia: Secondary | ICD-10-CM | POA: Diagnosis not present

## 2017-01-06 DIAGNOSIS — R51 Headache: Secondary | ICD-10-CM

## 2017-01-06 DIAGNOSIS — J439 Emphysema, unspecified: Secondary | ICD-10-CM | POA: Diagnosis not present

## 2017-01-06 DIAGNOSIS — G4719 Other hypersomnia: Secondary | ICD-10-CM | POA: Diagnosis not present

## 2017-01-06 DIAGNOSIS — R0681 Apnea, not elsewhere classified: Secondary | ICD-10-CM | POA: Diagnosis not present

## 2017-01-06 DIAGNOSIS — R519 Headache, unspecified: Secondary | ICD-10-CM

## 2017-01-06 NOTE — Telephone Encounter (Signed)
lmtcb x2 for pt. 

## 2017-01-06 NOTE — Patient Instructions (Signed)

## 2017-01-06 NOTE — Progress Notes (Signed)
Subjective:    Patient ID: Stacy Fernandez is a 56 y.o. female.  HPI     Star Age, MD, PhD [facility] [address]O. [place], [place] [address]  Dear Aliene Beams,   I saw your patient, Stacy Fernandez, upon your kind request, in my clinic today for initial consultation of her sleep disorder, in particular, concern for underlying obstructive sleep apnea. The patient is unaccompanied today. As you know, Stacy Fernandez is a 56 year old right-handed woman with an underlying complex medical history of anxiety disorder, breast cancer with status post bilateral mastectomy (2015), no XRT, but chemo x 18 months, chronic migraines, chronic kidney disease, COPD, reflux disease, hypothyroidism, status post multiple surgeries and history of overweight state, who reports snoring and excessive daytime somnolence. I reviewed your office note from 11/30/2016. Her Epworth sleepiness score is 16 out of 24, fatigue score is 48 out of 63. She lives with her husband. She has 4 grown children, 19 grandchildren. She quit smoking in August 2014, but has had the occasional cigarette since then, does not drink alcohol, denies illicit drug use, drinks caffeine in the form of coffee, 2 cups daily usually. Snoring can be loud per husband. Bedtime is around 9 PM, has to be up at 5 AM.  She has woken up with a headache. She has RLS type symptoms. She has nocturia about 2 times a night. She has no FHx of OSA. She is somewhat familiar with the diagnosis of OSA as her husband was diagnosed with it and had a CPAP machine. She sees pulmonology for her emphysema. She is on an inhaler once a day. She has gained weight since she stopped smoking. She was also on prednisone in the context of her breast cancer treatment and  gained weight because of that. She would be willing to consider a CPAP machine. He has noted apneic pauses during her sleep.   Her Past Medical History Is Significant For: Past  Medical History:  Diagnosis Date  . Anxiety   . 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 Chemotherapy-induced peripheral neuropathy (HCC)    FEET  . 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 CKD (chronic kidney disease), stage III   . COPD with emphysema [facility]) pulmologist--  dr Elsworth Soho   advanded emphysema per CT   . History of chemotherapy    left breast ca-- 05-09-2015   to 04-30-2015  . History of gastroesophageal reflux (GERD)    no problems since Nissen fundoplication  . Hypothyroidism   . Rectovaginal fistula   . Skin changes related to chemotherapy    ARMS  . Supraclinoid carotid artery aneurysm, small neurologist-  dr willis/  dr Krista Blue   left internal - 2 mm;  aneurysm  vs.  infundibulum--  residual chronic migraine  . Wears dentures    upper  . Wears glasses     Her Past Surgical History Is Significant For: Past Surgical History:  Procedure Laterality Date  . APPENDECTOMY  1992  . BREAST RECONSTRUCTION WITH PLACEMENT OF TISSUE EXPANDER AND FLEX HD (ACELLULAR HYDRATED DERMIS) Bilateral 09/25/2014   Procedure: PLACEMENT OF BILATERAL TISSUE EXPANDER AND  ACELLULAR DERMIS FOR BREAST RECONSTRUCTION ;  Surgeon: [name], MD;  Location: [place];  Service: Plastics;  Laterality: Bilateral;  . CHOLECYSTECTOMY  1999  . ESOPHAGOGASTRODUODENOSCOPY (EGD) WITH PROPOFOL  01/18/2013  . LAPAROSCOPIC NISSEN FUNDOPLICATION  [phone]  . LEFT HEART CATHETERIZATION WITH CORONARY ANGIOGRAM N/A 12/26/2012   Procedure: LEFT HEART CATHETERIZATION WITH CORONARY ANGIOGRAM;  Surgeon: Lorretta Harp, MD;  Location: [facility] CATH LAB;  Service: Cardiovascular;  Laterality: N/A;   Nonobstructive CAD/  40-50% ostial D1/  normal LVF, ef 60%  .  LIPOSUCTION WITH LIPOFILLING Bilateral 12/18/2014   Procedure: LIPOFILLING TO BILATERAL CHEST;  Surgeon: [name], MD;  Location: [place];  Service: Plastics;  Laterality: Bilateral;  . PORT-A-CATH REMOVAL Right 06/28/2015   Procedure: REMOVAL PORT-A-CATH;  Surgeon: Leighton Ruff, MD;  Location: [facility];  Service: General;  Laterality: Right;  . PORTACATH PLACEMENT N/A 04/02/2014   Procedure: INSERTION PORT-A-CATH;  Surgeon: [name], MD;  Location: [place];  Service: General;  Laterality: N/A;  . REMOVAL OF BILATERAL TISSUE EXPANDERS WITH PLACEMENT OF BILATERAL BREAST IMPLANTS Bilateral 12/18/2014   Procedure: REMOVAL OF BILATERAL TISSUE EXPANDERS,PLACEMENT SILICONE IMPLANTS ;  Surgeon: [name], MD;  Location: [place];  Service: Plastics;  Laterality: Bilateral;  . SIMPLE MASTECTOMY WITH AXILLARY SENTINEL NODE BIOPSY Bilateral 04/02/2014   Procedure: LEFT TOTAL MASTECTOMY WITH LEFT AXILLARY SENTINEL NODE BIOPSY, RIGHT PROPHYLACTIC MASTETCTOMY;  Surgeon: [name], MD;  Location: [place];  Service: General;  Laterality: Bilateral;  . TOTAL ABDOMINAL HYSTERECTOMY W/ BILATERAL SALPINGOOPHORECTOMY  1996  . TRANSTHORACIC ECHOCARDIOGRAM  02-04-2015   grade 1 diastolic dysfunction, ef 63-87%/  trivial TR  . VESICO-VAGINAL FISTULA REPAIR N/A 07/17/2015   Procedure: REPAIR OF RECTOVAGINAL FISTULA;  Surgeon: [name], MD;  Location: [facility];  Service: Gynecology;  Laterality: N/A;  . VIDEO BRONCHOSCOPY Bilateral 01/16/2013   Procedure: VIDEO BRONCHOSCOPY WITHOUT FLUORO;  Surgeon: [name], MD;  Location: [facility];  Service: Cardiopulmonary;  Laterality: Bilateral;    Her Family History Is Significant For: Family History  Problem Relation Age of Onset  . Heart failure Mother   . Colon cancer Father 4       stomach cancer also in 22s  . Throat cancer Brother 18       smoker  . Heart attack Maternal Grandmother   .  Colon cancer Paternal Grandmother 73  . Cancer Paternal Grandfather        kidney and bladder  . Ovarian cancer Sister 84       ovarian cancer at 30, colorectal cancer at 38  . Throat cancer Brother 37       throat cancer, smoker  . Breast cancer Paternal Aunt 80  . Ovarian cancer Other 64       niece with ovarian cancer    Her Social History Is Significant For: Social History   Social History  . Marital status: Married    Spouse name: [name]   . Number of children: 4  . Years of education: GED   Occupational History  .  [employer]   Social History Main Topics  . Smoking status: Former Smoker    Packs/day: 0.00    Years: 37.00    Types: Cigarettes, E-cigarettes    Quit date: 03/29/2014  . Smokeless tobacco: Never Used     Comment: 07/28/16 no e cigarettes  . Alcohol use No  . Drug use: No  . Sexual activity: Not Currently   Other Topics  Concern  . None   Social History Narrative   Patient lives at home with her husband [name]) Patient works full time.   Education- GED   Right handed.   Caffeine- one cup of coffee daily.    Her Allergies Are:  Allergies  Allergen Reactions  . Contrast Media [Iodinated Diagnostic Agents] Anaphylaxis  . Prednisone Shortness Of Breath and Swelling    CAN TOLERATE IF GIVEN BENADRYL PRIOR   :   Her Current Medications Are:  Outpatient Encounter Prescriptions as of 01/06/2017  Medication Sig  . Diclofenac Potassium (CAMBIA) 50 MG PACK Take 50 mg by mouth as needed.  . divalproex (DEPAKOTE ER) 500 MG 24 hr tablet Take 2 tablets (1,000 mg total) by mouth at bedtime.  . DUONEB 0.5-2.5 (3) MG/3ML SOLN Inhale 3 mLs into the lungs every 6 (six) hours as needed (for shortness of breath). Reported on 07/08/2015  . furosemide (LASIX) 20 MG tablet Take 0.5 tablets (10 mg total) by mouth every morning.  Marland Kitchen levothyroxine (SYNTHROID, LEVOTHROID) 125 MCG tablet Take 125 mcg by mouth daily before breakfast.  . montelukast (SINGULAIR) 10 MG  tablet Take 10 mg by mouth at bedtime.   . nortriptyline (PAMELOR) 25 MG capsule Take 2 capsules (50 mg total) by mouth at bedtime.  . ondansetron (ZOFRAN) 4 MG tablet Take 1 tablet (4 mg total) by mouth every 8 (eight) hours as needed for nausea or vomiting.  . potassium chloride SA (KLOR-CON M20) 20 MEQ tablet TAKE 1 TABLET (20 MEQ TOTAL) BY MOUTH ONCE DAY--- TAKES IN AM  . PROAIR HFA 108 (90 BASE) MCG/ACT inhaler Inhale 2 puffs into the lungs every 4 (four) hours as needed for wheezing or shortness of breath. Reported on 07/08/2015  . SUMAtriptan (IMITREX) 100 MG tablet Take 1 tablet (100 mg total) by mouth once as needed for migraine. May repeat in 2 hours if headache persists or recurs.  . traZODone (DESYREL) 100 MG tablet TAKE 1 TABLET (100 MG TOTAL) BY MOUTH AT BEDTIME.   No facility-administered encounter medications on file as of 01/06/2017.   :  Review of Systems:  Out of a complete 14 point review of systems, all are reviewed and negative with the exception of these symptoms as listed below: Review of Systems  Neurological:       Pt presents today to discuss her sleep. Pt has never had a sleep study but does endorse snoring.  Epworth Sleepiness Scale 0= would never doze 1= slight chance of dozing 2= moderate chance of dozing 3= high chance of dozing  Sitting and reading: 3 Watching TV: 3 Sitting inactive in a public place (ex. Theater or meeting): 3 As a passenger in a car for an hour without a break: 3 Lying down to rest in the afternoon: 2 Sitting and talking to someone: 0 Sitting quietly after lunch (no alcohol): 2 In a car, while stopped in traffic: 0 Total: 16     Objective:  Neurologic Exam  Physical Exam Physical Examination:   Vitals:   01/06/17 1622  BP: 123/85  Pulse: 80    General Examination: The patient is a very pleasant 56 y.o. female in no acute distress. She appears well-developed and well-nourished and well groomed.   HEENT: Normocephalic,  atraumatic, pupils are equal, round and reactive to light and accommodation. Extraocular tracking is good without limitation to gaze excursion or nystagmus noted. Normal smooth pursuit is noted. Hearing is grossly intact. Face is symmetric with normal facial animation and  normal facial sensation. Speech is clear with no dysarthria noted. There is no hypophonia. There is no lip, neck/head, jaw or voice tremor. Neck is supple with full range of passive and active motion. There are no carotid bruits on auscultation. Oropharynx exam reveals: mild mouth dryness, adequate dental hygiene with dentures on top, and moderate airway crowding, due to small airway. Tonsils are small. Uvula is slightly longer and soft palate appears floppy. Mallampati is class II. Neck circumference is 14-3/4 inches.  Tongue protrudes centrally and palate elevates symmetrically.   Chest: Clear to auscultation without wheezing, rhonchi or crackles noted.  Heart: S1+S2+0, regular and normal without murmurs, rubs or gallops noted.   Abdomen: Soft, non-tender and non-distended with normal bowel sounds appreciated on auscultation.  Extremities: There is no pitting edema in the distal lower extremities bilaterally. Pedal pulses are intact.  Skin: Warm and dry without trophic changes noted.  Musculoskeletal: exam reveals no obvious joint deformities, tenderness or joint swelling or erythema.   Neurologically:  Mental status: The patient is awake, alert and oriented in all 4 spheres. Her immediate and remote memory, attention, language skills and fund of knowledge are appropriate. There is no evidence of aphasia, agnosia, apraxia or anomia. Speech is clear with normal prosody and enunciation. Thought process is linear. Mood is normal and affect is normal.  Cranial nerves II - XII are as described above under HEENT exam. In addition: shoulder shrug is normal with equal shoulder height noted. Motor exam: Normal bulk, strength and tone is  noted. There is no drift, tremor or rebound. Romberg is negative. Reflexes are 1+ throughout. Fine motor skills and coordination: grossly intact.  Cerebellar testing: No dysmetria or intention tremor. There is no truncal or gait ataxia.  Sensory exam: intact to light touch.  Gait, station and balance: She stands easily. No veering to one side is noted. No leaning to one side is noted. Posture is age-appropriate and stance is narrow based. Gait shows normal stride length and normal pace. No problems turning are noted.                Assessment and Plan:  In summary, Stacy Fernandez is a very pleasant 56 y.o.-year old female with an underlying complex medical history of anxiety disorder, breast cancer with status post bilateral mastectomy (2015), no XRT, but chemo x 18 months, chronic migraines, chronic kidney disease, COPD, reflux disease, hypothyroidism, status post multiple surgeries and history of overweight state, whose history and physical exam are concerning for obstructive sleep apnea (OSA). I had a long chat with the patient and her husband about my findings and the diagnosis of OSA, its prognosis and treatment options. We talked about medical treatments, surgical interventions and non-pharmacological approaches. I explained in particular the risks and ramifications of untreated moderate to severe OSA, especially with respect to developing cardiovascular disease down the Road, including congestive heart failure, difficult to treat hypertension, cardiac arrhythmias, or stroke. Even type 2 diabetes has, in part, been linked to untreated OSA. Symptoms of untreated OSA include daytime sleepiness, memory problems, mood irritability and mood disorder such as depression and anxiety, lack of energy, as well as recurrent headaches, especially morning headaches. We talked about smoking cessation and trying to maintain a healthy lifestyle in general, as well as the importance of weight control. I encouraged the  patient to eat healthy, exercise daily and keep well hydrated, to keep a scheduled bedtime and wake time routine, to not skip any meals and eat healthy  snacks in between meals. I advised the patient not to drive when feeling sleepy. I recommended the following at this time: sleep study with potential positive airway pressure titration. (We will score hypopneas at 3%).   I explained the sleep test procedure to the patient and also outlined possible surgical and non-surgical treatment options of OSA, including the use of a custom-made dental device (which would require a referral to a specialist dentist or oral surgeon), upper airway surgical options, such as pillar implants, radiofrequency surgery, tongue base surgery, and UPPP (which would involve a referral to an ENT surgeon). Rarely, jaw surgery such as mandibular advancement may be considered.  I also explained the CPAP treatment option to the patient, who indicated that she would be willing to try CPAP if the need arises. I explained the importance of being compliant with PAP treatment, not only for insurance purposes but primarily to improve Her symptoms, and for the patient's long term health benefit, including to reduce Her cardiovascular risks. I answered all their questions today and the patient and her husband were in agreement. I would like to see her back after the sleep study is completed and encouraged her to call with any interim questions, concerns, problems or updates.   Thank you very much for allowing me to participate in the care of this nice patient. If I can be of any further assistance to you please do not hesitate to talk to me.  Sincerely,   Star Age, MD, PhD

## 2017-01-07 MED ORDER — UMECLIDINIUM-VILANTEROL 62.5-25 MCG/INH IN AEPB: 1.0000 | INHALATION_SPRAY | Freq: Every day | RESPIRATORY_TRACT | 11 refills | Status: DC

## 2017-01-07 NOTE — Telephone Encounter (Signed)
I have called and lmom to make the pt aware that the rx for the anoro has been sent to her pharmacy.

## 2017-01-29 ENCOUNTER — Ambulatory Visit (INDEPENDENT_AMBULATORY_CARE_PROVIDER_SITE_OTHER): Payer: BLUE CROSS/BLUE SHIELD | Admitting: Neurology

## 2017-01-29 DIAGNOSIS — R0683 Snoring: Secondary | ICD-10-CM

## 2017-01-29 DIAGNOSIS — G4710 Hypersomnia, unspecified: Secondary | ICD-10-CM

## 2017-01-29 DIAGNOSIS — G4720 Circadian rhythm sleep disorder, unspecified type: Secondary | ICD-10-CM

## 2017-02-03 NOTE — Progress Notes (Signed)
Patient referred by Dr. Krista Blue, seen by me on 01/06/17, diagnostic PSG on 01/29/17.   Please call and notify the patient that the recent sleep study did not show any significant obstructive sleep apnea with the exception of intermittent snoring. For disturbing snoring, an oral appliance (through a qualified dentist) can be considered. The study did not show an intrinsic sleep disorder as a cause of the patient's symptoms. Other causes, including circadian rhythm disturbances, an underlying mood disorder, medication effect and/or an underlying medical problem cannot be ruled out. Please remind patient to try to maintain good sleep hygiene, which means: Keep a regular sleep and wake schedule and make enough time for sleep (7 1/2 to 8 1/2 hours for the average adult), try not to exercise or have a meal within 2 hours of your bedtime, try to keep your bedroom conducive for sleep, that is, cool and dark, without light distractors such as an illuminated alarm clock, and refrain from watching TV right before sleep or in the middle of the night and do not keep the TV or radio on during the night. If a nightlight is used, have it away from the visual field. Also, try not to use or play on electronic devices at bedtime, such as your cell phone, tablet PC or laptop. If you like to read at bedtime on an electronic device, try to dim the background light as much as possible. Do not eat in the middle of the night. Keep pets away from the bedroom environment. For stress relief, try meditation, deep breathing exercises (there are many books and CDs available), a white noise machine or fan can help to diffuse other noise distractors, such as traffic noise. Do not drink alcohol before bedtime, as it can disturb sleep and cause middle of the night awakenings. Never mix alcohol and sedating medications! Avoid narcotic pain medication close to bedtime, as opioids/narcotics can suppress breathing drive and breathing effort.   She can FU  with Dr. Krista Blue as planned. Once you have spoken to patient, you can close this encounter.   Thanks,  Star Age, MD, PhD [facility])

## 2017-02-03 NOTE — Procedures (Signed)
PATIENT'S NAME:  Stacy Fernandez, Stacy Fernandez DOB:      1960-12-03      MR#:    [id number]     DATE OF RECORDING: 01/29/2017 REFERRING M.D.: Marcial Pacas, MD, PCP: Bradly Bienenstock, NP Study Performed:   Baseline Polysomnogram HISTORY: 56 year old woman with a history of anxiety disorder, breast cancer, migraines, chronic kidney disease, COPD, reflux disease, hypothyroidism, status post multiple surgeries and history of overweight state, who reports snoring and excessive daytime somnolence. The patient endorsed the Epworth Sleepiness Scale at 16 points. The patient's weight 176 pounds with a height of 66 (inches), resulting in a BMI of 28. kg/m2. The patient's neck circumference measured 14.8 inches.  CURRENT MEDICATIONS: Cambia, Depakoe ER, Lasix, Synthroid, Singulair, Pamelor, Zofran, Klor-Con, ProAir HFA, Imitrex, Desyrel   PROCEDURE:  This is a multichannel digital polysomnogram utilizing the Somnostar 11.2 system.  Electrodes and sensors were applied and monitored per AASM Specifications.   EEG, EOG, Chin and Limb EMG, were sampled at 200 Hz.  ECG, Snore and Nasal Pressure, Thermal Airflow, Respiratory Effort, CPAP Flow and Pressure, Oximetry was sampled at 50 Hz. Digital video and audio were recorded.      BASELINE STUDY  Lights Out was at 22:14 and Lights On at 05:10.  Total recording time (TRT) was 416.5 minutes, with a total sleep time (TST) of  387.5 minutes.   The patient's sleep latency was 16 minutes, which is normal.  REM latency was 167.5 minutes, which is delayed. The sleep efficiency was 93. %.     SLEEP ARCHITECTURE: WASO (Wake after sleep onset) was 21 minutes with mild sleep fragmentation noted. There were 12 minutes in Stage N1, 257 minutes Stage N2, 64 minutes Stage N3 and 54.5 minutes in Stage REM.  The percentage of Stage N1 was 3.1%, Stage N2 was 66.3%, which is increased, Stage N3 was 16.5%, which is normal, and Stage R (REM sleep) was 14.1%, which is decreased.  The arousals were noted as:  82 were spontaneous, 9 were associated with PLMs, 11 were associated with respiratory events.    Audio and video analysis did not show any abnormal or unusual movements, behaviors, phonations or vocalizations.  The patient took 1 bathroom break. Intermittent snoring was noted, ranging from mild to moderate. The EKG was in keeping with normal sinus rhythm (NSR).  RESPIRATORY ANALYSIS:  There were a total of 12 respiratory events:  7 obstructive apneas, 1 central apneas and 1 mixed apneas with a total of 9 apneas and an apnea index (AI) of 1.4 /hour. There were 3 hypopneas with a hypopnea index of .5 /hour. The patient also had 0 respiratory event related arousals (RERAs).      The total APNEA/HYPOPNEA INDEX (AHI) was 1.9/hour and the total RESPIRATORY DISTURBANCE INDEX was 1.9 /hour.  1 events occurred in REM sleep and 7 events in NREM. The REM AHI was 1.1 /hour, versus a non-REM AHI of 2.. The patient spent 64 minutes of total sleep time in the supine position and 324 minutes in non-supine.. The supine AHI was 2.8 versus a non-supine AHI of 1.7.  OXYGEN SATURATION & C02:  The Wake baseline 02 saturation was 97%, with the lowest being 87%. Time spent below 89% saturation equaled 1 minutes.  PERIODIC LIMB MOVEMENTS: The patient had a total of 18 Periodic Limb Movements.  The Periodic Limb Movement (PLM) index was 2.8 and the PLM Arousal index was 1.4/hour.    Post-study, the patient indicated that sleep was the same as usual.  IMPRESSION:  1. Primary Snoring 2. Dysfunctions associated with sleep stages or arousal from sleep  RECOMMENDATIONS:  1. This study does not demonstrate any significant obstructive or central sleep disordered breathing with the exception of intermittent snoring. For disturbing snoring, an oral appliance (through a qualified dentist) can be considered.  2. This study does not support an intrinsic sleep disorder as a cause of the patient's symptoms. Other causes, including  circadian rhythm disturbances, an underlying mood disorder, medication effect and/or an underlying medical problem cannot be ruled out. 3. This study shows sleep fragmentation and abnormal sleep stage percentages; these are nonspecific findings and per se do not signify an intrinsic sleep disorder or a cause for the patient's sleep-related symptoms. Causes include (but are not limited to) the first night effect of the sleep study, circadian rhythm disturbances, medication effect or an underlying mood disorder or medical problem.  4. The patient should be cautioned not to drive, work at heights, or operate dangerous or heavy equipment when tired or sleepy. Review and reiteration of good sleep hygiene measures should be pursued with any patient. 5. The patient can follow-up with her referring provider, who will be notified of the test results.  I certify that I have reviewed the entire raw data recording prior to the issuance of this report in accordance with the Standards of Accreditation of the American Academy of Sleep Medicine (AASM)   Star Age, MD, PhD Diplomat, American Board of Psychiatry and Neurology (Neurology and Sleep Medicine)

## 2017-02-04 ENCOUNTER — Telehealth: Payer: Self-pay

## 2017-02-04 NOTE — Telephone Encounter (Signed)
-----   Message from Star Age, MD sent at 02/03/2017  6:31 PM EDT ----- Patient referred by Dr. Krista Blue, seen by me on 01/06/17, diagnostic PSG on 01/29/17.   Please call and notify the patient that the recent sleep study did not show any significant obstructive sleep apnea with the exception of intermittent snoring. For disturbing snoring, an oral appliance (through a qualified dentist) can be considered. The study did not show an intrinsic sleep disorder as a cause of the patient's symptoms. Other causes, including circadian rhythm disturbances, an underlying mood disorder, medication effect and/or an underlying medical problem cannot be ruled out. Please remind patient to try to maintain good sleep hygiene, which means: Keep a regular sleep and wake schedule and make enough time for sleep (7 1/2 to 8 1/2 hours for the average adult), try not to exercise or have a meal within 2 hours of your bedtime, try to keep your bedroom conducive for sleep, that is, cool and dark, without light distractors such as an illuminated alarm clock, and refrain from watching TV right before sleep or in the middle of the night and do not keep the TV or radio on during the night. If a nightlight is used, have it away from the visual field. Also, try not to use or play on electronic devices at bedtime, such as your cell phone, tablet PC or laptop. If you like to read at bedtime on an electronic device, try to dim the background light as much as possible. Do not eat in the middle of the night. Keep pets away from the bedroom environment. For stress relief, try meditation, deep breathing exercises (there are many books and CDs available), a white noise machine or fan can help to diffuse other noise distractors, such as traffic noise. Do not drink alcohol before bedtime, as it can disturb sleep and cause middle of the night awakenings. Never mix alcohol and sedating medications! Avoid narcotic pain medication close to bedtime, as  opioids/narcotics can suppress breathing drive and breathing effort.   She can FU with Dr. Krista Blue as planned. Once you have spoken to patient, you can close this encounter.   Thanks,  Star Age, MD, PhD [facility])

## 2017-02-04 NOTE — Telephone Encounter (Signed)
I called pt. I advised pt that Dr. Rexene Alberts reviewed pt's sleep study and found that did not show any significant osa with the exception of intermittent snoring during her latest sleep study. For disturbing snoring, an oral appliance, made by a qualified dentist, can be considered. The sleep study did not reveal a intrinsic sleep disorder so pt's symptoms may be related to a mood disorder, circadian rhythm disturbances, medication effect, or an underlying medical problem. Dr. Rexene Alberts recommends that pt follow up with Dr. Krista Blue as planned. I reviewed sleep hygiene recommendations with the pt, including trying to keep a regular sleep wake schedule, avoiding electronics in the bedroom, keeping the bedroom cool, dark, and quiet, and avoiding eating or exercising within 2 hours of bedtime as well as eating in the middle of the night. I advised pt to keep pets away from the bedtime. I discussed with pt the importance of stress relief and to try meditation, deep breathing exercises, and/or a white noise machine or fan to diffuse other noise distracters. I advised pt to not drink alcohol before bedtime and to never mix alcohol and sedating medications. Pt was advised to avoid narcotic pain medication close to bedtime. I advised pt that a copy of these sleep study results will be sent to Dr. Krista Blue. Pt verbalized understanding of results. Pt had no questions at this time but was encouraged to call back if questions arise.

## 2017-02-04 NOTE — Telephone Encounter (Signed)
Patient called office returning RN's call.  Please call °

## 2017-02-04 NOTE — Telephone Encounter (Signed)
I called pt to discuss her sleep study results. No answer, left a message asking her to call me back. 

## 2017-02-04 NOTE — Telephone Encounter (Signed)
I called pt to discuss. No answer, left another message asking her to call me back.

## 2017-02-04 NOTE — Telephone Encounter (Signed)
Patient called office returning RN's call.  Patient will be on lunch and available until 1:00pm.  Please call

## 2017-02-15 ENCOUNTER — Telehealth: Payer: Self-pay | Admitting: Neurology

## 2017-02-15 NOTE — Telephone Encounter (Signed)
I called to schedule the patient for her botox injection, she did not answer so I left a VM asking her to call me back.

## 2017-02-15 NOTE — Telephone Encounter (Signed)
Pt returned Danielle's call. She works 7-4. Said she will call back tomorrow at 12:30 on her lunch

## 2017-02-15 NOTE — Telephone Encounter (Signed)
Patient called office in reference to scheduling her Botox appointment.  Please call

## 2017-02-17 NOTE — Telephone Encounter (Signed)
Noted, thank you

## 2017-04-28 NOTE — Progress Notes (Signed)
[place]  [phone] [phone]     ID: [name] DOB: [birth year]  MR#: [id number]  YQI#:[id number]  Patient Care Team: Jettie Booze, NP as PCP - General (Family Medicine) [name], MD (Inactive) as PCP - Family Medicine (Nurse Practitioner) [name], MD as Consulting Physician (General Surgery) Cayleigh Paull, Virgie Dad, MD as Consulting Physician (Oncology) [name], MD as Consulting Physician (Gastroenterology) [name], MD as Consulting Physician (Pulmonary Disease) [name], MD as Consulting Physician (Obstetrics and Gynecology) Smitty Cords Judeth Cornfield., MD as Referring Physician (Sports Medicine) Deterding, Jeneen Rinks, MD as Consulting Physician (Nephrology) OTHER MD:  [name] MD, [name] MD  CHIEF COMPLAINT: HER-2 positive, 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 She brought it to her primary physician's attention him a and on 02/21/2014 she underwent bilateral diagnostic mammography with tomography at [facility]. The breast composition was category B. In the left breast at the 3:00 position there was an irregular mass associated with skin retraction. Ultrasound performed on the same day confirmed a 1.1 cm irregular solid mass in the left breast at the 3:00 position. There were no abnormalities by sonography in the left axilla or in the superior portion of the breast, which is for the patient experiences some tenderness.  Biopsy of the left breast area in question 02/22/2014 showed ([id number]) and invasive ductal carcinoma, grade 3, estrogen and progesterone receptor negative, with an MIB-1 of 83%, and HER-2 amplified, the signals ratio being 2.07, and a copy number per cell 4.75.  On 02/28/2014 the patient underwent bilateral  breast MRI, which showed in the posterior third of the left breast at the 3:00 position an irregular spiculated mass measuring 1.8 cm. The left breast was unremarkable and there were no abnormal appearing lymph nodes.  The patient's subsequent history is as detailed below  INTERVAL HISTORY: Stacy Fernandez returns today for follow up of her estrogen receptor negative breast cancer, accompanied by her husband [name]. She is doing overall "okay", and continues to work at her rather physically demanding job.  She tells me she has pain in her mid upper back. It has been present several months, she says. She also has pain in the left breast area radiating up to her left shoulder. She also hurts under the arm. She tells me that she brought this to the attention of urgent care in [place] and films were obtained which "did not show anything". I do not have access to those films through Epic.  REVIEW OF SYSTEMS:  Stacy Fernandez reports doing well, besides a constant, mid upper back pain that started about 3 months ago. She notes that it is worse with movement. She also has chest pain that has been bothering her for about 3 months as well. She was prescribed hydrocodone for her pain, but notes mild to no relief. Pt also notes a dry cough for the last two weeks. She started smoking again about 5 week ago. Pt hadn't smoked in almost 3 years. She has not been taking lasix because her blood pressure had been low. She denies unusual headaches, visual changes, nausea, vomiting, or dizziness. There has been no phlegm production, or pleurisy. This been no change in bowel or bladder habits. She denies unexplained fatigue or unexplained weight loss, bleeding, rash, or fever. A detailed review of systems  was otherwise entirely negative.    PAST MEDICAL HISTORY: Past Medical History:  Diagnosis Date  . Anxiety   . 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 Chemotherapy-induced peripheral neuropathy (HCC)    FEET  . 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 CKD (chronic kidney disease), stage III   . COPD with emphysema [facility]) pulmologist--  dr Elsworth Soho   advanded emphysema per CT   . History of chemotherapy    left breast ca-- 05-09-2015   to 04-30-2015  . History of gastroesophageal reflux (GERD)    no problems since Nissen fundoplication  . Hypothyroidism   . Rectovaginal fistula   . Skin changes related to chemotherapy    ARMS  . Supraclinoid carotid artery aneurysm, small neurologist-  dr willis/  dr Krista Blue   left internal - 2 mm;  aneurysm  vs.  infundibulum--  residual chronic migraine  . Wears dentures    upper  . Wears glasses     PAST SURGICAL HISTORY: Past Surgical History:  Procedure Laterality Date  . APPENDECTOMY  1992  . BREAST RECONSTRUCTION WITH PLACEMENT OF TISSUE EXPANDER AND FLEX HD (ACELLULAR HYDRATED DERMIS) Bilateral 09/25/2014   Procedure: PLACEMENT OF BILATERAL TISSUE EXPANDER AND  ACELLULAR DERMIS FOR BREAST RECONSTRUCTION ;  Surgeon: [name], MD;  Location: [place];  Service: Plastics;  Laterality: Bilateral;  . CHOLECYSTECTOMY  1999  . ESOPHAGOGASTRODUODENOSCOPY (EGD) WITH PROPOFOL  01/18/2013  . LAPAROSCOPIC NISSEN FUNDOPLICATION  [id number]  . LEFT HEART CATHETERIZATION WITH CORONARY ANGIOGRAM N/A 12/26/2012   Procedure: LEFT HEART CATHETERIZATION WITH CORONARY ANGIOGRAM;  Surgeon: Lorretta Harp, MD;  Location[facility] CATH LAB;  Service: Cardiovascular;  Laterality: N/A;   Nonobstructive CAD/  40-50% ostial D1/  normal LVF, ef 60%  . LIPOSUCTION WITH LIPOFILLING Bilateral 12/18/2014   Procedure: LIPOFILLING TO BILATERAL CHEST;  Surgeon: [name], MD;  Location: [place];  Service: Plastics;  Laterality: Bilateral;   . PORT-A-CATH REMOVAL Right 06/28/2015   Procedure: REMOVAL PORT-A-CATH;  Surgeon: Leighton Ruff, MD;  Location: [facility];  Service: General;  Laterality: Right;  . PORTACATH PLACEMENT N/A 04/02/2014   Procedure: INSERTION PORT-A-CATH;  Surgeon: [name], MD;  Location: [place];  Service: General;  Laterality: N/A;  . REMOVAL OF BILATERAL TISSUE EXPANDERS WITH PLACEMENT OF BILATERAL BREAST IMPLANTS Bilateral 12/18/2014   Procedure: REMOVAL OF BILATERAL TISSUE EXPANDERS,PLACEMENT SILICONE IMPLANTS ;  Surgeon: [name], MD;  Location: [place];  Service: Plastics;  Laterality: Bilateral;  . SIMPLE MASTECTOMY WITH AXILLARY SENTINEL NODE BIOPSY Bilateral 04/02/2014   Procedure: LEFT TOTAL MASTECTOMY WITH LEFT AXILLARY SENTINEL NODE BIOPSY, RIGHT PROPHYLACTIC MASTETCTOMY;  Surgeon: [name], MD;  Location: [place];  Service: General;  Laterality: Bilateral;  . TOTAL ABDOMINAL HYSTERECTOMY W/ BILATERAL SALPINGOOPHORECTOMY  1996  . TRANSTHORACIC ECHOCARDIOGRAM  02-04-2015   grade 1 diastolic dysfunction, ef 46-96%/  trivial TR  . VESICO-VAGINAL FISTULA REPAIR N/A 07/17/2015   Procedure: REPAIR OF RECTOVAGINAL FISTULA;  Surgeon: [name], MD;  Location: [facility];  Service: Gynecology;  Laterality: N/A;  . VIDEO BRONCHOSCOPY Bilateral 01/16/2013   Procedure: VIDEO BRONCHOSCOPY WITHOUT FLUORO;  Surgeon: [name], MD;  Location: [facility];  Service: Cardiopulmonary;  Laterality: Bilateral;    FAMILY HISTORY Family History  Problem Relation Age of Onset  . Heart failure Mother   . Colon cancer Father 22       stomach cancer also in 56s  . Throat cancer Brother 25       smoker  . Heart attack Maternal Grandmother   . Colon cancer Paternal Grandmother 36  . Cancer Paternal Grandfather        kidney and bladder  . Ovarian cancer Sister 92       ovarian cancer at 54, colorectal cancer at 48  . Throat cancer Brother 37       throat cancer,  smoker  . Breast cancer Paternal Aunt 24  . Ovarian cancer Other 73       niece with ovarian cancer   The patient's father died at the age of 33 from metastatic stomach cancer the patient's father's mother died from colon cancer at the age of 71. The patient's mother died at the age of 49. The patient had 2 brothers and 2 sisters. One brother died at the age of 77 from throat cancer metastatic to the lung. He was a smoker. A second brother Was diagnosed at age 43 with throat cancer metastatic to lung. He has been given 90 days to live" is not looking good". One sister died from colon cancer metastatic to bone. One sister died of a drug overdose. There is no history of breast or bearing cancer in the family to the patient's knowledge.  GYNECOLOGIC HISTORY:  No LMP recorded. Patient has had a hysterectomy. Menarche age 60, first live birth age 35, the patient is [place] P4. She underwent hysterectomy with bilateral salpingo-oophorectomy approximately 1990. She took hormone replacement for approximately 2 years.  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 Her (second) husband, [name], is retired. The patient has 2 sons and 2 daughters from an earlier marriage, all living nearby him a and of course she has 4 stepchildren through her current husband. Altogether they have 19 grandchildren. She attends a local [place]: Not in place   HEALTH MAINTENANCE: Social History  Substance Use Topics  . Smoking status: Former Smoker    Packs/day: 0.00    Years: 37.00    Types: Cigarettes, E-cigarettes    Quit date: 03/29/2014  . Smokeless tobacco: Never Used     Comment: 07/28/16 no e cigarettes  . Alcohol use No     Colonoscopy: August 2015  PAP: May 2013  Bone density:  Lipid panel:  Allergies  Allergen Reactions  . Contrast Media [Iodinated Diagnostic  Agents] Anaphylaxis  . Prednisone Shortness Of Breath and Swelling    CAN TOLERATE IF GIVEN BENADRYL PRIOR     Current Outpatient Prescriptions  Medication Sig Dispense Refill  . Diclofenac Potassium (CAMBIA) 50 MG PACK Take 50 mg by mouth as needed. 9 each 11  . divalproex (DEPAKOTE ER) 500 MG 24 hr tablet Take 2 tablets (1,000 mg total) by mouth at bedtime. 60 tablet 4  . DUONEB 0.5-2.5 (3) MG/3ML SOLN Inhale 3 mLs into the lungs every 6 (six) hours as needed (for shortness of breath). Reported on 07/08/2015  3  . furosemide (LASIX) 20 MG tablet Take 0.5 tablets (10 mg total) by mouth every morning. 30 tablet 0  .  levothyroxine (SYNTHROID, LEVOTHROID) 125 MCG tablet Take 125 mcg by mouth daily before breakfast.    . montelukast (SINGULAIR) 10 MG tablet Take 10 mg by mouth at bedtime.     . nortriptyline (PAMELOR) 25 MG capsule Take 2 capsules (50 mg total) by mouth at bedtime. 60 capsule 11  . ondansetron (ZOFRAN) 4 MG tablet Take 1 tablet (4 mg total) by mouth every 8 (eight) hours as needed for nausea or vomiting. 30 tablet 6  . potassium chloride SA (KLOR-CON M20) 20 MEQ tablet TAKE 1 TABLET (20 MEQ TOTAL) BY MOUTH ONCE DAY--- TAKES IN AM 30 tablet 1  . PROAIR HFA 108 (90 BASE) MCG/ACT inhaler Inhale 2 puffs into the lungs every 4 (four) hours as needed for wheezing or shortness of breath. Reported on 07/08/2015    . SUMAtriptan (IMITREX) 100 MG tablet Take 1 tablet (100 mg total) by mouth once as needed for migraine. May repeat in 2 hours if headache persists or recurs. 9 tablet 11  . traZODone (DESYREL) 100 MG tablet TAKE 1 TABLET (100 MG TOTAL) BY MOUTH AT BEDTIME. 30 tablet 1  . umeclidinium-vilanterol (ANORO ELLIPTA) 62.5-25 MCG/INH AEPB Inhale 1 puff into the lungs daily. 1 each 11   No current facility-administered medications for this visit.     OBJECTIVE: Middle-aged white woman In no acute distress  Vitals:   04/29/17 1453  BP: 120/68  Pulse: 83  Resp: 17  Temp: 97.8 F  (36.6 C)  SpO2: 100%     Body mass index is 26.65 kg/m.    ECOG FS:1 - Symptomatic but completely ambulatory   Sclerae unicteric, pupils round and equal Oropharynx clear and moist No cervical or supraclavicular adenopathy Lungs no rales or rhonchi Heart regular rate and rhythm Abd soft, nontender, positive bowel sounds MSK no focal spinal tenderness to vigorous palpation and percussion, no upper extremity lymphedema Neuro: nonfocal, well oriented, appropriate affect Breasts: Status post bilateral mastectomies with bilateral implant reconstruction. I do not find any abnormality in the left side in particular and I cannot reproduce the pain she has been experiencing by palpation. Both axillae are benign   LAB RESULTS:  CMP     Component Value Date/Time   NA 142 04/29/2017 1438   K 3.9 04/29/2017 1438   CL 108 (H) 11/30/2016 1649   CO2 23 04/29/2017 1438   GLUCOSE 75 04/29/2017 1438   BUN 20.3 04/29/2017 1438   CREATININE 1.1 04/29/2017 1438   CALCIUM 9.3 04/29/2017 1438   PROT 6.9 04/29/2017 1438   ALBUMIN 4.1 04/29/2017 1438   AST 16 04/29/2017 1438   ALT 10 04/29/2017 1438   ALKPHOS 64 04/29/2017 1438   BILITOT 0.34 04/29/2017 1438   GFRNONAA 64 11/30/2016 1649   GFRAA 73 11/30/2016 1649    I No results found for: SPEP  Lab Results  Component Value Date   WBC 3.8 (L) 04/29/2017   NEUTROABS 1.9 04/29/2017   HGB 11.1 (L) 04/29/2017   HCT 32.1 (L) 04/29/2017   MCV 93.8 04/29/2017   PLT 204 04/29/2017      Chemistry      Component Value Date/Time   NA 142 04/29/2017 1438   K 3.9 04/29/2017 1438   CL 108 (H) 11/30/2016 1649   CO2 23 04/29/2017 1438   BUN 20.3 04/29/2017 1438   CREATININE 1.1 04/29/2017 1438      Component Value Date/Time   CALCIUM 9.3 04/29/2017 1438   ALKPHOS 64 04/29/2017 1438   AST 16  04/29/2017 1438   ALT 10 04/29/2017 1438   BILITOT 0.34 04/29/2017 1438       No results found for: LABCA2  No components found for:  LABCA125  No results for input(s): INR in the last 168 hours.  Urinalysis    Component Value Date/Time   COLORURINE YELLOW 02/18/2015 0510   APPEARANCEUR CLOUDY (A) 02/18/2015 0510   LABSPEC 1.018 02/18/2015 0510   LABSPEC 1.010 07/10/2014 1004   PHURINE 5.0 02/18/2015 0510   GLUCOSEU NEGATIVE 02/18/2015 0510   GLUCOSEU Negative 07/10/2014 1004   HGBUR SMALL (A) 02/18/2015 0510   BILIRUBINUR NEGATIVE 02/18/2015 0510   BILIRUBINUR Negative 07/10/2014 1004   KETONESUR NEGATIVE 02/18/2015 0510   PROTEINUR NEGATIVE 02/18/2015 0510   UROBILINOGEN 0.2 02/18/2015 0510   UROBILINOGEN 0.2 07/10/2014 1004   NITRITE NEGATIVE 02/18/2015 0510   LEUKOCYTESUR MODERATE (A) 02/18/2015 0510   LEUKOCYTESUR Negative 07/10/2014 1004    STUDIES: No results found.  ASSESSMENT: 56 y.o. BRCA negative [place] woman status post left breast Upper outer quadrant biopsy 02/22/2014 for a clinical T2/T3 NX, stage II or III invasive ductal carcinoma, grade 3, estrogen and progesterone receptor negative, with an MIB-1 of 83%, and HER-2 amplified with a signals a ratio of 2.07and a copy number per cell of 4.75  (1) biopsy of an additional area of enhancement in the left breast 03/08/2014 showed ductal carcinoma in situ, estrogen and progesterone receptor negative.  (2) status post bilateral mastectomies with left sentinel lymph node sampling 04/02/2014, showing:  (a) on the right, benign breast tissue including a single negative lymph node  (b) On  the left, a  pT1c pN0, stage IA invasive ductal carcinoma, grade 3, with negative margins  (3) adjuvant chemotherapy started 05/08/2014, consisting of carboplatin and docetaxel given every 3 weeks x6, together with trastuzumab, with neulasta day 2   (a) docetaxel removed from final cycle 08/21/2014 because of persistent neuropathy symptoms  (4) trastuzumab continued to complete a year (last dose 04/30/2015)  (a) echo 02/04/15 showed a well-preserved ejection  fraction  (5) reconstruction with bilateral silicone implants 73/41/9379 ([place])  (6) tobacco abuse: The patient quit smoking 03/30/2014, resumed September 2018  (7) peripheral neuropathy secondary to chemotherapy: chiefly involving feet  (8) lung nodules resolved on 01/24/15 CT scan  (9) pelvic/ abdominal pain: extensive evaluation shows no evidence of metastatic disease but are consistent with a rectovaginal fistula; 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  (10) chronic kidney disease level III:  PLAN: Stacy Fernandez is now 3 years out from definitive surgery for her breast cancer with no definitive evidence of disease recurrence. This is very favorable.  I am not sure why she is having the mid upper back pain. The pain in the left breast and left shoulder and left axilla area are probably postoperative, relating to scar tissue from earlier surgeries. I think however it would be prudent to obtain a CT scan of the chest at this point and I have set her up for that. I expect this to be benign however. I will call her with those results.  I have encouraged her to continue off cigarettes--she has relapsed somewhat even though she is not smoking as much as she used to previously.  She will see me again in 6 months. She knows to call for any problems that may develop before her next visit here.   Medical Oncology and Hematology [facility] [address] [place], [place] [address] Tel. [phone]    Fax. [phone]  This document  serves as a record of services personally performed by [name], MD. It was created on her behalf by Margit Banda, a trained medical scribe. The creation of this record is based on the scribe's personal observations and the provider's statements to them. This document has been checked and approved by the attending provider.

## 2017-04-29 ENCOUNTER — Other Ambulatory Visit (HOSPITAL_BASED_OUTPATIENT_CLINIC_OR_DEPARTMENT_OTHER): Payer: BLUE CROSS/BLUE SHIELD

## 2017-04-29 ENCOUNTER — Ambulatory Visit (HOSPITAL_BASED_OUTPATIENT_CLINIC_OR_DEPARTMENT_OTHER): Payer: BLUE CROSS/BLUE SHIELD | Admitting: Oncology

## 2017-04-29 VITALS — BP 120/68 | HR 83 | Temp 97.8°F | Resp 17 | Ht 66.5 in | Wt 167.6 lb

## 2017-04-29 DIAGNOSIS — Z171 Estrogen receptor negative status [ER-]: Secondary | ICD-10-CM

## 2017-04-29 DIAGNOSIS — Z853 Personal history of malignant neoplasm of breast: Secondary | ICD-10-CM | POA: Diagnosis not present

## 2017-04-29 DIAGNOSIS — C50412 Malignant neoplasm of upper-outer quadrant of left female breast: Secondary | ICD-10-CM

## 2017-04-29 LAB — COMPREHENSIVE METABOLIC PANEL
ALK PHOS: 64 U/L (ref 40–150)
ALT: 10 U/L (ref 0–55)
AST: 16 U/L (ref 5–34)
Albumin: 4.1 g/dL (ref 3.5–5.0)
Anion Gap: 10 mEq/L (ref 3–11)
BUN: 20.3 mg/dL (ref 7.0–26.0)
CALCIUM: 9.3 mg/dL (ref 8.4–10.4)
CO2: 23 mEq/L (ref 22–29)
CREATININE: 1.1 mg/dL (ref 0.6–1.1)
Chloride: 109 mEq/L (ref 98–109)
EGFR: 57 mL/min/{1.73_m2} — ABNORMAL LOW (ref >60)
Glucose: 75 mg/dl (ref 70–140)
Potassium: 3.9 mEq/L (ref 3.5–5.1)
Sodium: 142 mEq/L (ref 136–145)
TOTAL PROTEIN: 6.9 g/dL (ref 6.4–8.3)
Total Bilirubin: 0.34 mg/dL (ref 0.20–1.20)

## 2017-04-29 LAB — CBC WITH DIFFERENTIAL/PLATELET
BASO%: 1.1 % (ref 0.0–2.0)
BASOS ABS: 0 10*3/uL (ref 0.0–0.1)
EOS%: 1.1 % (ref 0.0–7.0)
Eosinophils Absolute: 0 10*3/uL (ref 0.0–0.5)
HEMATOCRIT: 32.1 % — AB (ref 34.8–46.6)
HGB: 11.1 g/dL — ABNORMAL LOW (ref 11.6–15.9)
LYMPH#: 1.5 10*3/uL (ref 0.9–3.3)
LYMPH%: 39.1 % (ref 14.0–49.7)
MCH: 32.5 pg (ref 25.1–34.0)
MCHC: 34.7 g/dL (ref 31.5–36.0)
MCV: 93.8 fL (ref 79.5–101.0)
MONO#: 0.3 10*3/uL (ref 0.1–0.9)
MONO%: 8.4 % (ref 0.0–14.0)
NEUT#: 1.9 10*3/uL (ref 1.5–6.5)
NEUT%: 50.3 % (ref 38.4–76.8)
PLATELETS: 204 10*3/uL (ref 145–400)
RBC: 3.42 10*6/uL — ABNORMAL LOW (ref 3.70–5.45)
RDW: 13 % (ref 11.2–14.5)
WBC: 3.8 10*3/uL — ABNORMAL LOW (ref 3.9–10.3)

## 2017-04-30 ENCOUNTER — Telehealth (HOSPITAL_COMMUNITY): Payer: Self-pay

## 2017-05-06 ENCOUNTER — Encounter (HOSPITAL_COMMUNITY): Payer: Self-pay

## 2017-05-06 ENCOUNTER — Ambulatory Visit (HOSPITAL_COMMUNITY)
Admission: RE | Admit: 2017-05-06 | Discharge: 2017-05-06 | Disposition: A | Discharge status: Home / Self Care | Payer: BLUE CROSS/BLUE SHIELD | Source: Ambulatory Visit | Attending: Oncology | Admitting: Oncology

## 2017-05-06 DIAGNOSIS — N200 Calculus of kidney: Secondary | ICD-10-CM | POA: Diagnosis not present

## 2017-05-06 DIAGNOSIS — J439 Emphysema, unspecified: Secondary | ICD-10-CM | POA: Diagnosis not present

## 2017-05-06 DIAGNOSIS — C50412 Malignant neoplasm of upper-outer quadrant of left female breast: Secondary | ICD-10-CM | POA: Diagnosis not present

## 2017-05-06 DIAGNOSIS — I2510 Atherosclerotic heart disease of native coronary artery without angina pectoris: Secondary | ICD-10-CM | POA: Insufficient documentation

## 2017-05-06 DIAGNOSIS — I700 Atherosclerosis of aorta: Secondary | ICD-10-CM | POA: Diagnosis not present

## 2017-05-06 DIAGNOSIS — Z171 Estrogen receptor negative status [ER-]: Secondary | ICD-10-CM

## 2017-05-06 MED ORDER — IOPAMIDOL (ISOVUE-300) INJECTION 61%
75.0000 mL | Freq: Once | INTRAVENOUS | Status: AC | PRN
  Administered 2017-05-06: 75 mL via INTRAVENOUS

## 2017-05-11 ENCOUNTER — Other Ambulatory Visit: Payer: Self-pay | Admitting: Oncology

## 2017-06-07 ENCOUNTER — Ambulatory Visit (INDEPENDENT_AMBULATORY_CARE_PROVIDER_SITE_OTHER): Payer: BLUE CROSS/BLUE SHIELD | Admitting: Adult Health

## 2017-06-07 ENCOUNTER — Encounter: Payer: Self-pay | Admitting: Adult Health

## 2017-06-07 VITALS — BP 104/64 | HR 97 | Ht 66.5 in | Wt 162.8 lb

## 2017-06-07 DIAGNOSIS — Z720 Tobacco use: Secondary | ICD-10-CM

## 2017-06-07 DIAGNOSIS — J449 Chronic obstructive pulmonary disease, unspecified: Secondary | ICD-10-CM

## 2017-06-07 NOTE — Assessment & Plan Note (Addendum)
Stable without flare  Smoking cessation is key   Plan  Patient Instructions  Continue on ANORO daily .  Work on quitting smoking .  Follow up with Dr. Elsworth Soho  In 6 months and As needed

## 2017-06-07 NOTE — Patient Instructions (Signed)
Continue on ANORO daily .  Work on quitting smoking .  Follow up with Dr. Elsworth Soho  In 6 months and As needed

## 2017-06-07 NOTE — Progress Notes (Signed)
@Patient  ID: Stacy Fernandez, female    DOB: 1960/12/27, 56 y.o.   MRN: [id number]  Chief Complaint  Patient presents with  . Follow-up    COPD     Referring provider: Jettie Booze, NP  HPI: 56 yo female smoker followed for COPD  Hx of breast cancer s/p chemo  GERD s/p Nissen   Significant tests/ events  CPET 11/2012 showed mildly reduced functional status with O2 of 76% predicted.  Spirometry 11/2012 -moderate airway obstruction with FEV1 of 58% FVC of 71% and ratio 65, diffusion was 38%   Spirometry 11/2014  ratio 65, FEV1 61%, FVC 75%   06/07/2017 Follow up : COPD  Pt returns for 6 month follow up for COPD. Says overall breathing is doing  She was started on ANORO .last ov , feels breathing is doing about the same . No increased dyspnea or flare of cough /wheezing . Works full time and lots of over time. Does all her house work without issues. Does have have intermittent dry cough . Spirometry shows stable lung function with Moderate COPD with FEV1 62%, ratio 64, FVC 75%.    She was recently seen for back and chest pain , Oncology set up for a CT chest that was done on 05/06/17 that showed , no adenopathy, severe Emphysema , says she is going to see GI tomorrow to make sure not GERD causing pain.  . Denies exertional chest pain . Sx mainly at night that wakes her up over last 5 months .   She has restarted smoking , discussed cessation .   Flu shot is utd.        Allergies  Allergen Reactions  . Prednisone Shortness Of Breath and Swelling    CAN TOLERATE IF GIVEN BENADRYL PRIOR     Immunization History  Administered Date(s) Administered  . Influenza Split 05/09/2017  . Influenza Whole 02/18/2012  . Influenza,inj,Quad PF,6+ Mos 04/03/2014, 06/12/2015, 03/20/2016  . Pneumococcal Polysaccharide-23 04/03/2014    Past Medical History:  Diagnosis Date  . Anxiety   . 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 Chemotherapy-induced peripheral neuropathy (HCC)    FEET  . 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 CKD (chronic kidney disease), stage III ([place])   . COPD with emphysema [facility]) pulmologist--  dr Elsworth Soho   advanded emphysema per CT   . History of chemotherapy    left breast ca-- 05-09-2015   to 04-30-2015  . History of gastroesophageal reflux (GERD)    no problems since Nissen fundoplication  . Hypothyroidism   . Rectovaginal fistula   . Skin changes related to chemotherapy    ARMS  . Supraclinoid carotid artery aneurysm, small neurologist-  dr willis/  dr Krista Blue   left internal - 2 mm;  aneurysm  vs.  infundibulum--  residual chronic migraine  . Wears dentures    upper  . Wears glasses     Tobacco History: Social History   Tobacco Use  Smoking Status Current Every Day Smoker  . Packs/day: 0.00  . Years: 37.00  . Pack years: 0.00  . Types: Cigarettes, E-cigarettes  . Last attempt to quit: 03/29/2014  . Years since quitting: 3.1  Smokeless Tobacco Never Used  Tobacco Comment   5-7 cigs daily 06/07/17   Ready to quit: Not Answered Counseling given: Not Answered Comment: 5-7 cigs daily 06/07/17   Outpatient Encounter Medications as of 06/07/2017  Medication Sig  . Diclofenac Potassium (CAMBIA) 50 MG PACK Take 50 mg by mouth as needed.  . divalproex (DEPAKOTE ER) 500 MG 24 hr tablet Take 2 tablets (1,000 mg total) by mouth at bedtime.  . DUONEB 0.5-2.5 (3) MG/3ML SOLN Inhale 3 mLs into the lungs every 6 (six) hours as needed (for shortness of breath). Reported on 07/08/2015  . levothyroxine (SYNTHROID, LEVOTHROID) 125 MCG tablet Take 125 mcg by mouth daily before breakfast.  . montelukast (SINGULAIR) 10 MG tablet Take 10 mg by mouth at bedtime.   . nortriptyline (PAMELOR) 25 MG  capsule Take 2 capsules (50 mg total) by mouth at bedtime.  Marland Kitchen PROAIR HFA 108 (90 BASE) MCG/ACT inhaler Inhale 2 puffs into the lungs every 4 (four) hours as needed for wheezing or shortness of breath. Reported on 07/08/2015  . SUMAtriptan (IMITREX) 100 MG tablet Take 1 tablet (100 mg total) by mouth once as needed for migraine. May repeat in 2 hours if headache persists or recurs.  . traZODone (DESYREL) 100 MG tablet TAKE 1 TABLET (100 MG TOTAL) BY MOUTH AT BEDTIME.  Marland Kitchen umeclidinium-vilanterol (ANORO ELLIPTA) 62.5-25 MCG/INH AEPB Inhale 1 puff into the lungs daily.  . furosemide (LASIX) 20 MG tablet Take 0.5 tablets (10 mg total) by mouth every morning. (Patient not taking: Reported on 06/07/2017)  . potassium chloride SA (KLOR-CON M20) 20 MEQ tablet TAKE 1 TABLET (20 MEQ TOTAL) BY MOUTH ONCE DAY--- TAKES IN AM (Patient not taking: Reported on 06/07/2017)  . [DISCONTINUED] ondansetron (ZOFRAN) 4 MG tablet Take 1 tablet (4 mg total) by mouth every 8 (eight) hours as needed for nausea or vomiting. (Patient not taking: Reported on 06/07/2017)   No facility-administered encounter medications on file as of 06/07/2017.      Review of Systems  Constitutional:   No  weight loss, night sweats,  Fevers, chills, +fatigue, or  lassitude.  HEENT:   No headaches,  Difficulty swallowing,  Tooth/dental problems, or  Sore throat,                No sneezing, itching, ear ache, nasal congestion, post nasal drip,   CV:  No chest pain,  Orthopnea, PND, swelling in lower extremities, anasarca, dizziness, palpitations, syncope.   GI  No heartburn, indigestion, abdominal pain, nausea, vomiting, diarrhea, change in bowel habits, loss of appetite, bloody stools.   Resp: N   No excess mucus, no productive cough,  No non-productive cough,  No coughing up of blood.  No change in color of mucus.  No wheezing.  No chest wall deformity  Skin: no rash or lesions.  GU: no dysuria, change in color of urine, no urgency or  frequency.  No flank pain, no hematuria   MS:  No joint pain or swelling.  No decreased range of motion    Physical Exam  BP 104/64 (BP Location: Right Arm, Cuff Size: Normal)   Pulse 97   Ht 5' 6.5" (1.689 m)   Wt 162 lb 12.8 oz (73.8 kg)   SpO2 99%   BMI 25.88 kg/m   GEN: A/Ox3; pleasant , NAD    HEENT:  [place]/AT,  EACs-clear, TMs-wnl, NOSE-clear, THROAT-clear, no lesions, no postnasal drip or exudate noted.   NECK:  Supple w/ fair ROM; no JVD;  normal carotid impulses w/o bruits; no thyromegaly or nodules palpated; no lymphadenopathy.    RESP  Clear  P & A; w/o, wheezes/ rales/ or rhonchi. no accessory muscle use, no dullness to percussion  CARD:  RRR, no m/r/g, no peripheral edema, pulses intact, no cyanosis or clubbing.  GI:   Soft & nt; nml bowel sounds; no organomegaly or masses detected.   Musco: Warm bil, no deformities or joint swelling noted.   Neuro: alert, no focal deficits noted.    Skin: Warm, no lesions or rashes     BNP No results found for: BNP   Imaging: No results found.   Assessment & Plan:   COPD (chronic obstructive pulmonary disease) Stable without flare  Smoking cessation is key   Plan  There are no Patient Instructions on file for this visit.   Tobacco abuse Smoking cessation      Rexene Edison, NP 06/07/2017

## 2017-06-07 NOTE — Assessment & Plan Note (Signed)
Smoking cessation  

## 2017-06-18 NOTE — Progress Notes (Signed)
Reviewed & agree with plan  

## 2017-07-06 ENCOUNTER — Other Ambulatory Visit: Payer: Self-pay | Admitting: Nurse Practitioner

## 2017-07-08 NOTE — Telephone Encounter (Signed)
Last note with MD mentioned Depakote 500 mg daily. Prescription from January is for 1000 mg at bedtime.

## 2017-07-15 ENCOUNTER — Encounter: Payer: Self-pay | Admitting: Oncology

## 2017-11-01 NOTE — Progress Notes (Signed)
Stacy Fernandez  [phone] [phone]     ID: Stacy Fernandez DOB: [birth year]  MR#: [id number]  GGE#:[id number]  Patient Care Team: Jettie Booze, NP as PCP - General (Family Medicine) [name], MD (Inactive) as PCP - Family Medicine (Nurse Practitioner) [name], MD as Consulting Physician (General Surgery) Magrinat, Virgie Dad, MD as Consulting Physician (Oncology) [name], MD as Consulting Physician (Gastroenterology) [name], MD as Consulting Physician (Pulmonary Disease) [name], MD as Consulting Physician (Obstetrics and Gynecology) Smitty Cords Judeth Cornfield., MD as Referring Physician (Sports Medicine) Deterding, Jeneen Rinks, MD as Consulting Physician (Nephrology) Kathrynn Ducking, MD as Consulting Physician (Neurology) OTHER MD:  [name] MD, [name] MD  CHIEF COMPLAINT: HER-2 positive, 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 She brought it to her primary physician's attention him a and on 02/21/2014 she underwent bilateral diagnostic mammography with tomography at [facility]. The breast composition was category B. In the left breast at the 3:00 position there was an irregular mass associated with skin retraction. Ultrasound performed on the same day confirmed a 1.1 cm irregular solid mass in the left breast at the 3:00 position. There were no abnormalities by sonography in the left axilla or in the superior portion of the breast, which is for the patient experiences some tenderness.  Biopsy of the left breast area in question 02/22/2014 showed ([id number]) and invasive ductal carcinoma, grade 3, estrogen and progesterone receptor negative, with an MIB-1 of 83%, and HER-2 amplified, the signals ratio being 2.07, and a copy number per  cell 4.75.  On 02/28/2014 the patient underwent bilateral breast MRI, which showed in the posterior third of the left breast at the 3:00 position an irregular spiculated mass measuring 1.8 cm. The left breast was unremarkable and there were no abnormal appearing lymph nodes.  The patient's subsequent history is as detailed below  INTERVAL HISTORY: Stacy Fernandez returns today for follow up of her estrogen receptor negative breast cancer, accompanied by her husband.  Since her last visit to the office, she had a colonoscopy completed on 07/15/2017 with results showing: Rectum/Anal, Polyp: Suggestive of mucosal prolapse-type polyp. No dysplasia or malignancy identified.   She also had a CT Chest with contrast completed on 05/06/2017 with results of: No findings to suggest metastatic disease in the thorax. Diffuse bronchial wall thickening with severe centrilobular and paraseptal emphysema; imaging findings suggestive of underlying COPD. Please note, at this patient likely qualifies for lung cancer screening (assuming a greater than 30 pack-year history of smoking), and should be considered for annual lung cancer screening starting in October 2019. Aortic atherosclerosis, in addition to 2 vessel coronary artery disease. Please note that although the presence of coronary artery calcium documents the presence of coronary artery disease, the severity of this disease and any potential stenosis cannot be assessed on this non-gated CT examination. Assessment for potential risk factor modification, dietary therapy or pharmacologic therapy may be warranted, if clinically indicated. 2 mm nonobstructive calculus in the upper pole collecting system of the right kidney. Additional incidental findings, as above. Aortic Atherosclerosis (ICD10-I70.0) and Emphysema (ICD10-J43.9).  REVIEW OF SYSTEMS:  Stacy Fernandez reports that she is having pain in the bottom of her left breast that extends around the breast and up her chest. Sometimes  it will wake her up  at night. The pain is not every day, but when it comes, it lasts for 3-4 hours. She doesn't take pain medication because of her kidney issues. She has not tried tramadol yet. She previously took gabapentin for neuropathy in her legs, but this was discontinued. The neuropathy in her legs has improved. She denies falling or stumbling. She is still smoking about half a pack per day. She is following up with her pulmonologist and said the her emphysema is unchanged. She is aware that she needs to quit, but this has been difficult for her. She is also taking migraine medication. She is not having a lot of coughing. She works 6-7 days per week, and sometimes 12 hours a day. She is constantly on her feet and lifting throughout the day. She denies unusual headaches, visual changes, nausea, vomiting, or dizziness. There has been no unusual cough, phlegm production, or pleurisy. This been no change in bowel or bladder habits. She denies unexplained fatigue or unexplained weight loss, bleeding, rash, or fever. A detailed review of systems was otherwise stable.    PAST MEDICAL HISTORY: Past Medical History:  Diagnosis Date  . Anxiety   . 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 Chemotherapy-induced peripheral neuropathy (HCC)    FEET  . 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 CKD (chronic kidney disease), stage III ([place])   . COPD with emphysema [facility]) pulmologist--  dr Elsworth Soho   advanded emphysema per CT   . History of chemotherapy    left breast ca-- 05-09-2015   to 04-30-2015  . History of gastroesophageal reflux (GERD)    no problems since Nissen fundoplication  . Hypothyroidism   . Rectovaginal fistula   . Skin changes related to chemotherapy     ARMS  . Supraclinoid carotid artery aneurysm, small neurologist-  dr willis/  dr Krista Blue   left internal - 2 mm;  aneurysm  vs.  infundibulum--  residual chronic migraine  . Wears dentures    upper  . Wears glasses     PAST SURGICAL HISTORY: Past Surgical History:  Procedure Laterality Date  . APPENDECTOMY  1992  . BREAST RECONSTRUCTION WITH PLACEMENT OF TISSUE EXPANDER AND FLEX HD (ACELLULAR HYDRATED DERMIS) Bilateral 09/25/2014   Procedure: PLACEMENT OF BILATERAL TISSUE EXPANDER AND  ACELLULAR DERMIS FOR BREAST RECONSTRUCTION ;  Surgeon: [name], MD;  Location: [place];  Service: Plastics;  Laterality: Bilateral;  . CHOLECYSTECTOMY  1999  . ESOPHAGOGASTRODUODENOSCOPY (EGD) WITH PROPOFOL  01/18/2013  . LAPAROSCOPIC NISSEN FUNDOPLICATION  [phone]  . LEFT HEART CATHETERIZATION WITH CORONARY ANGIOGRAM N/A 12/26/2012   Procedure: LEFT HEART CATHETERIZATION WITH CORONARY ANGIOGRAM;  Surgeon: Lorretta Harp, MD;  Location: [facility];  Service: Cardiovascular;  Laterality: N/A;   Nonobstructive CAD/  40-50% ostial D1/  normal LVF, ef 60%  . LIPOSUCTION WITH LIPOFILLING Bilateral 12/18/2014   Procedure: LIPOFILLING TO BILATERAL CHEST;  Surgeon: [name], MD;  Location: [place];  Service: Plastics;  Laterality: Bilateral;  . PORT-A-CATH REMOVAL Right 06/28/2015   Procedure: REMOVAL PORT-A-CATH;  Surgeon: Leighton Ruff, MD;  Location: [facility];  Service: General;  Laterality: Right;  . PORTACATH PLACEMENT N/A 04/02/2014  Procedure: INSERTION PORT-A-CATH;  Surgeon: [name], MD;  Location: [place];  Service: General;  Laterality: N/A;  . REMOVAL OF BILATERAL TISSUE EXPANDERS WITH PLACEMENT OF BILATERAL BREAST IMPLANTS Bilateral 12/18/2014   Procedure: REMOVAL OF BILATERAL TISSUE EXPANDERS,PLACEMENT SILICONE IMPLANTS ;  Surgeon: [name], MD;  Location: [place];  Service: Plastics;  Laterality: Bilateral;    . SIMPLE MASTECTOMY WITH AXILLARY SENTINEL NODE BIOPSY Bilateral 04/02/2014   Procedure: LEFT TOTAL MASTECTOMY WITH LEFT AXILLARY SENTINEL NODE BIOPSY, RIGHT PROPHYLACTIC MASTETCTOMY;  Surgeon: [name], MD;  Location: [place];  Service: General;  Laterality: Bilateral;  . TOTAL ABDOMINAL HYSTERECTOMY W/ BILATERAL SALPINGOOPHORECTOMY  1996  . TRANSTHORACIC ECHOCARDIOGRAM  02-04-2015   grade 1 diastolic dysfunction, ef 41-96%/  trivial TR  . VESICO-VAGINAL FISTULA REPAIR N/A 07/17/2015   Procedure: REPAIR OF RECTOVAGINAL FISTULA;  Surgeon: [name], MD;  Location: [facility];  Service: Gynecology;  Laterality: N/A;  . VIDEO BRONCHOSCOPY Bilateral 01/16/2013   Procedure: VIDEO BRONCHOSCOPY WITHOUT FLUORO;  Surgeon: [name], MD;  Location: [facility];  Service: Cardiopulmonary;  Laterality: Bilateral;    FAMILY HISTORY Family History  Problem Relation Age of Onset  . Heart failure Mother   . Colon cancer Father 11       stomach cancer also in 13s  . Throat cancer Brother 35       smoker  . Heart attack Maternal Grandmother   . Colon cancer Paternal Grandmother 42  . Cancer Paternal Grandfather        kidney and bladder  . Ovarian cancer Sister 87       ovarian cancer at 27, colorectal cancer at 94  . Throat cancer Brother 37       throat cancer, smoker  . Breast cancer Paternal Aunt 38  . Ovarian cancer Other 38       niece with ovarian cancer   The patient's father died at the age of 71 from metastatic stomach cancer the patient's father's mother died from colon cancer at the age of 11. The patient's mother died at the age of 8. The patient had 2 brothers and 2 sisters. One brother died at the age of 54 from throat cancer metastatic to the lung. He was a smoker. A second brother Was diagnosed at age 55 with throat cancer metastatic to lung. He has been given 90 days to live" is not looking good". One sister died from colon cancer metastatic to bone. One sister died of a  drug overdose. There is no history of breast or bearing cancer in the family to the patient's knowledge.  GYNECOLOGIC HISTORY:  No LMP recorded. Patient has had a hysterectomy. Menarche age 9, first live birth age 15, the patient is [place] P4. She underwent hysterectomy with bilateral salpingo-oophorectomy approximately 1990. She took hormone replacement for approximately 2 years.  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  She can work 6 or even 7 days a week.  Her (second) husband, [name], is retired. The patient has 2 sons and 2 daughters from an earlier marriage, all living nearby; and  she has 69 stepchildren through her current husband. Altogether they have 19 grandchildren. She attends a local [employer]: Not in place   HEALTH MAINTENANCE: Social History   Tobacco Use  . Smoking status: Current Every Day Smoker  Packs/day: 0.00    Years: 37.00    Pack years: 0.00    Types: Cigarettes, E-cigarettes    Last attempt to quit: 03/29/2014    Years since quitting: 3.6  . Smokeless tobacco: Never Used  . Tobacco comment: 5-7 cigs daily 06/07/17  Substance Use Topics  . Alcohol use: No  . Drug use: No     Colonoscopy: August 2015  PAP: May 2013  Bone density:  Lipid panel:  Allergies  Allergen Reactions  . Prednisone Shortness Of Breath and Swelling    CAN TOLERATE IF GIVEN BENADRYL PRIOR     Current Outpatient Medications  Medication Sig Dispense Refill  . Diclofenac Potassium (CAMBIA) 50 MG PACK Take 50 mg by mouth as needed. 9 each 11  . divalproex (DEPAKOTE ER) 500 MG 24 hr tablet TAKE 2 TABLETS BY MOUTH AT BEDTIME 60 tablet 4  . DUONEB 0.5-2.5 (3) MG/3ML SOLN Inhale 3 mLs into the lungs every 6 (six) hours as needed (for shortness of breath). Reported on 07/08/2015  3  . furosemide (LASIX) 20 MG tablet Take 0.5 tablets (10 mg  total) by mouth every morning. (Patient not taking: Reported on 06/07/2017) 30 tablet 0  . levothyroxine (SYNTHROID, LEVOTHROID) 125 MCG tablet Take 125 mcg by mouth daily before breakfast.    . montelukast (SINGULAIR) 10 MG tablet Take 10 mg by mouth at bedtime.     . nortriptyline (PAMELOR) 25 MG capsule Take 2 capsules (50 mg total) by mouth at bedtime. 60 capsule 11  . potassium chloride SA (KLOR-CON M20) 20 MEQ tablet TAKE 1 TABLET (20 MEQ TOTAL) BY MOUTH ONCE DAY--- TAKES IN AM (Patient not taking: Reported on 06/07/2017) 30 tablet 1  . PROAIR HFA 108 (90 BASE) MCG/ACT inhaler Inhale 2 puffs into the lungs every 4 (four) hours as needed for wheezing or shortness of breath. Reported on 07/08/2015    . SUMAtriptan (IMITREX) 100 MG tablet Take 1 tablet (100 mg total) by mouth once as needed for migraine. May repeat in 2 hours if headache persists or recurs. 9 tablet 11  . traZODone (DESYREL) 100 MG tablet TAKE 1 TABLET (100 MG TOTAL) BY MOUTH AT BEDTIME. 30 tablet 1  . umeclidinium-vilanterol (ANORO ELLIPTA) 62.5-25 MCG/INH AEPB Inhale 1 puff into the lungs daily. 1 each 11   No current facility-administered medications for this visit.     OBJECTIVE: Middle-aged white woman who appears stated age  89:   11/02/17 1520  BP: 115/76  Pulse: 89  Resp: 18  Temp: 98.5 F (36.9 C)  SpO2: 100%     Body mass index is 24.82 kg/m.    ECOG FS:1 - Symptomatic but completely ambulatory   Sclerae unicteric, EOMs intact Oropharynx clear and moist No cervical or supraclavicular adenopathy Lungs no rales or rhonchi Heart regular rate and rhythm Abd soft, nontender, positive bowel sounds MSK no focal spinal tenderness, no upper extremity lymphedema Neuro: nonfocal, well oriented, appropriate affect Breasts: She is status post bilateral mastectomies, with bilateral implant reconstruction.  There is no evidence of local recurrence.  Both axillae are benign.  LAB RESULTS:  CMP     Component  Value Date/Time   NA 142 04/29/2017 1438   K 3.9 04/29/2017 1438   CL 108 (H) 11/30/2016 1649   CO2 23 04/29/2017 1438   GLUCOSE 75 04/29/2017 1438   BUN 20.3 04/29/2017 1438   CREATININE 1.1 04/29/2017 1438   CALCIUM 9.3 04/29/2017 1438   PROT 6.9 04/29/2017 1438  ALBUMIN 4.1 04/29/2017 1438   AST 16 04/29/2017 1438   ALT 10 04/29/2017 1438   ALKPHOS 64 04/29/2017 1438   BILITOT 0.34 04/29/2017 1438   GFRNONAA 64 11/30/2016 1649   GFRAA 73 11/30/2016 1649    I No results found for: SPEP  Lab Results  Component Value Date   WBC 5.0 11/02/2017   NEUTROABS 2.7 11/02/2017   HGB 12.8 11/02/2017   HCT 37.6 11/02/2017   MCV 94.2 11/02/2017   PLT 209 11/02/2017      Chemistry      Component Value Date/Time   NA 142 04/29/2017 1438   K 3.9 04/29/2017 1438   CL 108 (H) 11/30/2016 1649   CO2 23 04/29/2017 1438   BUN 20.3 04/29/2017 1438   CREATININE 1.1 04/29/2017 1438      Component Value Date/Time   CALCIUM 9.3 04/29/2017 1438   ALKPHOS 64 04/29/2017 1438   AST 16 04/29/2017 1438   ALT 10 04/29/2017 1438   BILITOT 0.34 04/29/2017 1438       No results found for: LABCA2  No components found for: LABCA125  No results for input(s): INR in the last 168 hours.  Urinalysis    Component Value Date/Time   COLORURINE YELLOW 02/18/2015 0510   APPEARANCEUR CLOUDY (A) 02/18/2015 0510   LABSPEC 1.018 02/18/2015 0510   LABSPEC 1.010 07/10/2014 1004   PHURINE 5.0 02/18/2015 0510   GLUCOSEU NEGATIVE 02/18/2015 0510   GLUCOSEU Negative 07/10/2014 1004   HGBUR SMALL (A) 02/18/2015 0510   BILIRUBINUR NEGATIVE 02/18/2015 0510   BILIRUBINUR Negative 07/10/2014 1004   KETONESUR NEGATIVE 02/18/2015 0510   PROTEINUR NEGATIVE 02/18/2015 0510   UROBILINOGEN 0.2 02/18/2015 0510   UROBILINOGEN 0.2 07/10/2014 1004   NITRITE NEGATIVE 02/18/2015 0510   LEUKOCYTESUR MODERATE (A) 02/18/2015 0510   LEUKOCYTESUR Negative 07/10/2014 1004    STUDIES: No results  found.  ASSESSMENT: 57 y.o. BRCA negative [place] woman status post left breast Upper outer quadrant biopsy 02/22/2014 for a clinical T2/T3 NX, stage II or III invasive ductal carcinoma, grade 3, estrogen and progesterone receptor negative, with an MIB-1 of 83%, and HER-2 amplified with a signals a ratio of 2.07and a copy number per cell of 4.75  (1) biopsy of an additional area of enhancement in the left breast 03/08/2014 showed ductal carcinoma in situ, estrogen and progesterone receptor negative.  (2) status post bilateral mastectomies with left sentinel lymph node sampling 04/02/2014, showing:  (a) on the right, benign breast tissue including a single negative lymph node  (b) On  the left, a  pT1c pN0, stage IA invasive ductal carcinoma, grade 3, with negative margins  (3) adjuvant chemotherapy started 05/08/2014, consisting of carboplatin and docetaxel given every 3 weeks x6, together with trastuzumab, with neulasta day 2   (a) docetaxel removed from final cycle 08/21/2014 because of persistent neuropathy symptoms  (4) trastuzumab continued to complete a year (last dose 04/30/2015)  (a) echo 02/04/15 showed a well-preserved ejection fraction  (5) reconstruction with bilateral silicone implants 65/09/5463 ([place])  (6) tobacco abuse: The patient quit smoking 03/30/2014, resumed September 2018  (7) peripheral neuropathy secondary to chemotherapy: chiefly involving feet  (8) lung nodules resolved on 01/24/15 CT scan  (9) pelvic/ abdominal pain: extensive evaluation shows no evidence of metastatic disease but are consistent with a rectovaginal fistula; 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  (10) chronic kidney disease level III:  PLAN: Stacy Fernandez is now 3-1/2 years out from definitive surgery for her breast cancer with no evidence of  disease recurrence.  This is very favorable.  Unfortunately she has not been able to quit smoking.  She tells me her job is very stressful.  She says she did much better  when she was on Wellbutrin but that there is an interaction between Wellbutrin and Imitrex and she really needs the Imitrex because nothing else has worked for her migraines  As far as her neuropathic pain in the left chest wall is concerned she tells me gabapentin was not helpful.  We are going to try tramadol and I explained how to use it and what the possible toxicity side effects and complications of this agent are.  If it does not work we will consider referral to Dr. Maryjean Ka for possible nerve block or other intervention  She will see me again in 6 months.  We are not planning to repeat a scan at that time unless there are specific symptoms to evaluate  Magrinat, Virgie Dad, MD  11/02/17 3:40 PM Medical Oncology and Hematology [facility] [place], [place] [address] Tel. [phone]    Fax. [phone]    This document serves as a record of services personally performed by Lurline Del, MD. It was created on his behalf by Sheron Nightingale, a trained medical scribe. The creation of this record is based on the scribe's personal observations and the provider's statements to them.   I have reviewed the above documentation for accuracy and completeness, and I agree with the above.

## 2017-11-02 ENCOUNTER — Inpatient Hospital Stay: Payer: BLUE CROSS/BLUE SHIELD

## 2017-11-02 ENCOUNTER — Telehealth: Payer: Self-pay | Admitting: Oncology

## 2017-11-02 ENCOUNTER — Inpatient Hospital Stay: Payer: BLUE CROSS/BLUE SHIELD | Attending: Oncology | Admitting: Oncology

## 2017-11-02 VITALS — BP 115/76 | HR 89 | Temp 98.5°F | Resp 18 | Ht 66.5 in | Wt 156.1 lb

## 2017-11-02 DIAGNOSIS — C50412 Malignant neoplasm of upper-outer quadrant of left female breast: Secondary | ICD-10-CM

## 2017-11-02 DIAGNOSIS — R0789 Other chest pain: Secondary | ICD-10-CM | POA: Insufficient documentation

## 2017-11-02 DIAGNOSIS — N183 Chronic kidney disease, stage 3 (moderate): Secondary | ICD-10-CM

## 2017-11-02 DIAGNOSIS — G620 Drug-induced polyneuropathy: Secondary | ICD-10-CM

## 2017-11-02 DIAGNOSIS — Z171 Estrogen receptor negative status [ER-]: Secondary | ICD-10-CM

## 2017-11-02 DIAGNOSIS — Z853 Personal history of malignant neoplasm of breast: Secondary | ICD-10-CM | POA: Diagnosis not present

## 2017-11-02 LAB — COMPREHENSIVE METABOLIC PANEL
ALK PHOS: 81 U/L (ref 38–126)
ALT: 17 U/L (ref 14–54)
ANION GAP: 10 (ref 5–15)
AST: 16 U/L (ref 15–41)
Albumin: 4.3 g/dL (ref 3.5–5.0)
BILIRUBIN TOTAL: 0.4 mg/dL (ref 0.3–1.2)
BUN: 19 mg/dL (ref 6–20)
CALCIUM: 9.7 mg/dL (ref 8.9–10.3)
CO2: 24 mmol/L (ref 22–32)
CREATININE: 1.21 mg/dL — AB (ref 0.44–1.00)
Chloride: 107 mmol/L (ref 101–111)
GFR calc non Af Amer: 49 mL/min — ABNORMAL LOW (ref >60)
GFR, EST AFRICAN AMERICAN: 57 mL/min — AB (ref >60)
Glucose, Bld: 85 mg/dL (ref 65–99)
Potassium: 4.2 mmol/L (ref 3.5–5.1)
Sodium: 141 mmol/L (ref 135–145)
Total Protein: 7.3 g/dL (ref 6.5–8.1)

## 2017-11-02 LAB — CBC WITH DIFFERENTIAL/PLATELET
BASOS ABS: 0 10*3/uL (ref 0.0–0.1)
BASOS PCT: 0 %
EOS ABS: 0.1 10*3/uL (ref 0.0–0.5)
Eosinophils Relative: 2 %
HCT: 37.6 % (ref 34.8–46.6)
HEMOGLOBIN: 12.8 g/dL (ref 11.6–15.9)
Lymphocytes Relative: 37 %
Lymphs Abs: 1.9 10*3/uL (ref 0.9–3.3)
MCH: 32.1 pg (ref 25.1–34.0)
MCHC: 34 g/dL (ref 31.5–36.0)
MCV: 94.2 fL (ref 79.5–101.0)
MONOS PCT: 8 %
Monocytes Absolute: 0.4 10*3/uL (ref 0.1–0.9)
NEUTROS ABS: 2.7 10*3/uL (ref 1.5–6.5)
NEUTROS PCT: 53 %
Platelets: 209 10*3/uL (ref 145–400)
RBC: 3.99 MIL/uL (ref 3.70–5.45)
RDW: 13.2 % (ref 11.2–14.5)
WBC: 5 10*3/uL (ref 3.9–10.3)

## 2017-11-02 MED ORDER — TRAMADOL HCL 50 MG PO TABS: 50.0000 mg | ORAL_TABLET | Freq: Four times a day (QID) | ORAL | 3 refills | Status: DC | PRN

## 2017-11-02 NOTE — Telephone Encounter (Signed)
Gave avs and calendar ° °

## 2017-11-15 DIAGNOSIS — G620 Drug-induced polyneuropathy: Secondary | ICD-10-CM | POA: Diagnosis not present

## 2017-11-15 DIAGNOSIS — E039 Hypothyroidism, unspecified: Secondary | ICD-10-CM | POA: Diagnosis not present

## 2017-11-15 DIAGNOSIS — T451X5A Adverse effect of antineoplastic and immunosuppressive drugs, initial encounter: Secondary | ICD-10-CM | POA: Diagnosis not present

## 2017-11-15 DIAGNOSIS — N183 Chronic kidney disease, stage 3 (moderate): Secondary | ICD-10-CM | POA: Diagnosis not present

## 2018-01-25 DIAGNOSIS — N390 Urinary tract infection, site not specified: Secondary | ICD-10-CM | POA: Diagnosis not present

## 2018-01-25 DIAGNOSIS — N183 Chronic kidney disease, stage 3 (moderate): Secondary | ICD-10-CM | POA: Diagnosis not present

## 2018-04-25 DIAGNOSIS — N183 Chronic kidney disease, stage 3 (moderate): Secondary | ICD-10-CM | POA: Diagnosis not present

## 2018-04-25 DIAGNOSIS — T451X5A Adverse effect of antineoplastic and immunosuppressive drugs, initial encounter: Secondary | ICD-10-CM | POA: Diagnosis not present

## 2018-04-25 DIAGNOSIS — E039 Hypothyroidism, unspecified: Secondary | ICD-10-CM | POA: Diagnosis not present

## 2018-04-25 DIAGNOSIS — I671 Cerebral aneurysm, nonruptured: Secondary | ICD-10-CM | POA: Diagnosis not present

## 2018-05-10 NOTE — Progress Notes (Signed)
[place]  [phone] [phone]     ID: Stacy Fernandez DOB: 01-02-1961  MR#: [id number]  TIW#:[id number]  Patient Care Team: Jettie Booze, NP as PCP - General (Family Medicine) [name], MD (Inactive) as PCP - Family Medicine (Nurse Practitioner) [name], MD as Consulting Physician (General Surgery) Magrinat, Virgie Dad, MD as Consulting Physician (Oncology) [name], MD as Consulting Physician (Gastroenterology) [name], MD as Consulting Physician (Pulmonary Disease) [name], MD as Consulting Physician (Obstetrics and Gynecology) Smitty Cords Judeth Cornfield., MD as Referring Physician (Sports Medicine) Deterding, Jeneen Rinks, MD as Consulting Physician (Nephrology) Kathrynn Ducking, MD as Consulting Physician (Neurology) OTHER MD:  [name] MD, [name] MD  CHIEF COMPLAINT: HER-2 positive, 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 She brought it to her primary physician's attention him a and on 02/21/2014 she underwent bilateral diagnostic mammography with tomography at [facility]. The breast composition was category B. In the left breast at the 3:00 position there was an irregular mass associated with skin retraction. Ultrasound performed on the same day confirmed a 1.1 cm irregular solid mass in the left breast at the 3:00 position. There were no abnormalities by sonography in the left axilla or in the superior portion of the breast, which is for the patient experiences some tenderness.  Biopsy of the left breast area in question 02/22/2014 showed ([id number]) and invasive ductal carcinoma, grade 3, estrogen and progesterone receptor negative, with an MIB-1 of 83%, and HER-2 amplified, the signals ratio being 2.07, and a copy number per  cell 4.75.  On 02/28/2014 the patient underwent bilateral breast MRI, which showed in the posterior third of the left breast at the 3:00 position an irregular spiculated mass measuring 1.8 cm. The left breast was unremarkable and there were no abnormal appearing lymph nodes.  The patient's subsequent history is as detailed below  INTERVAL HISTORY: Stacy Fernandez returns today for follow up of her estrogen receptor negative breast cancer, accompanied by her husband.  She is currently working 12-hour days 5 days a week.  At least she gets to have the weekends off.  Before she was working 8-hour shifts but 7 days a week with no breaks for weeks on end.  Unfortunately she continues to smoke.  She smokes about a half a pack per day.  She tells me she was doing well when she was on Wellbutrin but they took her off that medication because it may interact with some of her migraine medications.  Her migraines are doing better right now.  REVIEW OF SYSTEMS:  D[place] is on her feet all the time at work so she does not otherwise exercise.  She denies visual changes, nausea, vomiting, staggering, falling, or any change in bowel or bladder habits.  She denies cough, pleurisy, or hemoptysis.  She has discomfort in the right upper chest wall area slightly laterally, above the reconstructed breast, and also in the left axilla.  These are not new problems.  A detailed review of systems was otherwise stable.    PAST MEDICAL HISTORY: Past Medical History:  Diagnosis Date  . Anxiety   . 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 Chemotherapy-induced peripheral neuropathy (HCC)    FEET  . 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 CKD (chronic kidney disease), stage III ([place])   . COPD  with emphysema [facility]) pulmologist--  dr Elsworth Soho   advanded emphysema per CT   . History of chemotherapy    left breast ca-- 05-09-2015   to 04-30-2015  . History of gastroesophageal reflux (GERD)    no problems since Nissen fundoplication  . Hypothyroidism   . Rectovaginal fistula   . Skin changes related to chemotherapy    ARMS  . Supraclinoid carotid artery aneurysm, small neurologist-  dr willis/  dr Krista Blue   left internal - 2 mm;  aneurysm  vs.  infundibulum--  residual chronic migraine  . Wears dentures    upper  . Wears glasses     PAST SURGICAL HISTORY: Past Surgical History:  Procedure Laterality Date  . APPENDECTOMY  1992  . BREAST RECONSTRUCTION WITH PLACEMENT OF TISSUE EXPANDER AND FLEX HD (ACELLULAR HYDRATED DERMIS) Bilateral 09/25/2014   Procedure: PLACEMENT OF BILATERAL TISSUE EXPANDER AND  ACELLULAR DERMIS FOR BREAST RECONSTRUCTION ;  Surgeon: [name], MD;  Location: [place];  Service: Plastics;  Laterality: Bilateral;  . CHOLECYSTECTOMY  1999  . ESOPHAGOGASTRODUODENOSCOPY (EGD) WITH PROPOFOL  01/18/2013  . LAPAROSCOPIC NISSEN FUNDOPLICATION  [id number]  . LEFT HEART CATHETERIZATION WITH CORONARY ANGIOGRAM N/A 12/26/2012   Procedure: LEFT HEART CATHETERIZATION WITH CORONARY ANGIOGRAM;  Surgeon: Lorretta Harp, MD;  Location: [facility] CATH LAB;  Service: Cardiovascular;  Laterality: N/A;   Nonobstructive CAD/  40-50% ostial D1/  normal LVF, ef 60%  . LIPOSUCTION WITH LIPOFILLING Bilateral 12/18/2014   Procedure: LIPOFILLING TO BILATERAL CHEST;  Surgeon: [name], MD;  Location: [place];  Service: Plastics;  Laterality: Bilateral;  . PORT-A-CATH REMOVAL Right 06/28/2015   Procedure: REMOVAL PORT-A-CATH;  Surgeon: Leighton Ruff, MD;  Location: [facility];  Service: General;  Laterality: Right;  . PORTACATH PLACEMENT N/A 04/02/2014   Procedure: INSERTION PORT-A-CATH;  Surgeon: [name], MD;  Location: [place];  Service:  General;  Laterality: N/A;  . REMOVAL OF BILATERAL TISSUE EXPANDERS WITH PLACEMENT OF BILATERAL BREAST IMPLANTS Bilateral 12/18/2014   Procedure: REMOVAL OF BILATERAL TISSUE EXPANDERS,PLACEMENT SILICONE IMPLANTS ;  Surgeon: [name], MD;  Location: [place];  Service: Plastics;  Laterality: Bilateral;  . SIMPLE MASTECTOMY WITH AXILLARY SENTINEL NODE BIOPSY Bilateral 04/02/2014   Procedure: LEFT TOTAL MASTECTOMY WITH LEFT AXILLARY SENTINEL NODE BIOPSY, RIGHT PROPHYLACTIC MASTETCTOMY;  Surgeon: [name], MD;  Location: [place];  Service: General;  Laterality: Bilateral;  . TOTAL ABDOMINAL HYSTERECTOMY W/ BILATERAL SALPINGOOPHORECTOMY  1996  . TRANSTHORACIC ECHOCARDIOGRAM  02-04-2015   grade 1 diastolic dysfunction, ef 24-23%/  trivial TR  . VESICO-VAGINAL FISTULA REPAIR N/A 07/17/2015   Procedure: REPAIR OF RECTOVAGINAL FISTULA;  Surgeon: [name], MD;  Location: [facility];  Service: Gynecology;  Laterality: N/A;  . VIDEO BRONCHOSCOPY Bilateral 01/16/2013   Procedure: VIDEO BRONCHOSCOPY WITHOUT FLUORO;  Surgeon: [name], MD;  Location: [facility];  Service: Cardiopulmonary;  Laterality: Bilateral;    FAMILY HISTORY Family History  Problem Relation Age of Onset  . Heart failure Mother   . Colon cancer Father 71       stomach cancer also in 37s  . Throat cancer Brother 70  smoker  . Heart attack Maternal Grandmother   . Colon cancer Paternal Grandmother 84  . Cancer Paternal Grandfather        kidney and bladder  . Ovarian cancer Sister 69       ovarian cancer at 59, colorectal cancer at 26  . Throat cancer Brother 37       throat cancer, smoker  . Breast cancer Paternal Aunt 15  . Ovarian cancer Other 83       niece with ovarian cancer   The patient's father died at the age of 56 from metastatic stomach cancer the patient's father's mother died from colon cancer at the age of 3. The patient's mother died at the age of 89. The patient had 2  brothers and 2 sisters. One brother died at the age of 66 from throat cancer metastatic to the lung. He was a smoker. A second brother Was diagnosed at age 26 with throat cancer metastatic to lung. He has been given 90 days to live" is not looking good". One sister died from colon cancer metastatic to bone. One sister died of a drug overdose. There is no history of breast or bearing cancer in the family to the patient's knowledge.  GYNECOLOGIC HISTORY:  No LMP recorded. Patient has had a hysterectomy. Menarche age 33, first live birth age 41, the patient is [place] P4. She underwent hysterectomy with bilateral salpingo-oophorectomy approximately 1990. She took hormone replacement for approximately 2 years.  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  She can work 6 or even 7 days a week 8 hours a day, or 5 days a week 12 hours a day.  Currently she is doing the latter..  Her (second) husband, [name], is retired. The patient has 2 sons and 2 daughters from an earlier marriage, all living nearby; and  she has 8 stepchildren through her current husband. Altogether they have 19 grandchildren. She attends a local [employer]: Not in place   HEALTH MAINTENANCE: Social History   Tobacco Use  . Smoking status: Current Every Day Smoker    Packs/day: 0.00    Years: 37.00    Pack years: 0.00    Types: Cigarettes, E-cigarettes    Last attempt to quit: 03/29/2014    Years since quitting: 4.1  . Smokeless tobacco: Never Used  . Tobacco comment: 5-7 cigs daily 06/07/17  Substance Use Topics  . Alcohol use: No  . Drug use: No     Colonoscopy: August 2015  PAP: May 2013  Bone density:  Lipid panel:  Allergies  Allergen Reactions  . Prednisone Shortness Of Breath and Swelling    CAN TOLERATE IF GIVEN BENADRYL PRIOR     Current Outpatient Medications    Medication Sig Dispense Refill  . Diclofenac Potassium (CAMBIA) 50 MG PACK Take 50 mg by mouth as needed. 9 each 11  . divalproex (DEPAKOTE ER) 500 MG 24 hr tablet TAKE 2 TABLETS BY MOUTH AT BEDTIME 60 tablet 4  . DUONEB 0.5-2.5 (3) MG/3ML SOLN Inhale 3 mLs into the lungs every 6 (six) hours as needed (for shortness of breath). Reported on 07/08/2015  3  . furosemide (LASIX) 20 MG tablet Take 0.5 tablets (10 mg total) by mouth every morning. (Patient not taking: Reported on 06/07/2017) 30 tablet 0  . levothyroxine (SYNTHROID, LEVOTHROID) 125 MCG tablet Take  125 mcg by mouth daily before breakfast.    . montelukast (SINGULAIR) 10 MG tablet Take 10 mg by mouth at bedtime.     . nortriptyline (PAMELOR) 25 MG capsule Take 2 capsules (50 mg total) by mouth at bedtime. 60 capsule 11  . potassium chloride SA (KLOR-CON M20) 20 MEQ tablet TAKE 1 TABLET (20 MEQ TOTAL) BY MOUTH ONCE DAY--- TAKES IN AM (Patient not taking: Reported on 06/07/2017) 30 tablet 1  . PROAIR HFA 108 (90 BASE) MCG/ACT inhaler Inhale 2 puffs into the lungs every 4 (four) hours as needed for wheezing or shortness of breath. Reported on 07/08/2015    . SUMAtriptan (IMITREX) 100 MG tablet Take 1 tablet (100 mg total) by mouth once as needed for migraine. May repeat in 2 hours if headache persists or recurs. 9 tablet 11  . traMADol (ULTRAM) 50 MG tablet Take 1-2 tablets (50-100 mg total) by mouth every 6 (six) hours as needed. 90 tablet 3  . traZODone (DESYREL) 100 MG tablet TAKE 1 TABLET (100 MG TOTAL) BY MOUTH AT BEDTIME. 30 tablet 1  . umeclidinium-vilanterol (ANORO ELLIPTA) 62.5-25 MCG/INH AEPB Inhale 1 puff into the lungs daily. 1 each 11   No current facility-administered medications for this visit.     OBJECTIVE: Middle-aged white woman in no acute distress  Vitals:   05/11/18 1506  BP: 105/70  Pulse: 78  Resp: 18  Temp: 98 F (36.7 C)  SpO2: 100%     Body mass index is 25.52 kg/m.    ECOG FS:1 - Symptomatic but  completely ambulatory   Sclerae unicteric, pupils round and equal No cervical or supraclavicular adenopathy Lungs no rales or rhonchi Heart regular rate and rhythm Abd soft, nontender, positive bowel sounds MSK no focal spinal tenderness, no upper extremity lymphedema Neuro: nonfocal, well oriented, appropriate affect Breasts: She is status post bilateral mastectomies with bilateral implant reconstruction.  The area of discomfort above the right reconstructed breast is minimally tender there is no erythema or swelling and really no palpable finding.  The left axilla is also benign.   LAB RESULTS:  CMP     Component Value Date/Time   NA 141 05/11/2018 1442   NA 142 04/29/2017 1438   K 4.0 05/11/2018 1442   K 3.9 04/29/2017 1438   CL 107 05/11/2018 1442   CO2 25 05/11/2018 1442   CO2 23 04/29/2017 1438   GLUCOSE 85 05/11/2018 1442   GLUCOSE 75 04/29/2017 1438   BUN 14 05/11/2018 1442   BUN 20.3 04/29/2017 1438   CREATININE 0.83 05/11/2018 1442   CREATININE 1.1 04/29/2017 1438   CALCIUM 9.2 05/11/2018 1442   CALCIUM 9.3 04/29/2017 1438   PROT 7.0 05/11/2018 1442   PROT 6.9 04/29/2017 1438   ALBUMIN 4.1 05/11/2018 1442   ALBUMIN 4.1 04/29/2017 1438   AST 20 05/11/2018 1442   AST 16 04/29/2017 1438   ALT 20 05/11/2018 1442   ALT 10 04/29/2017 1438   ALKPHOS 109 05/11/2018 1442   ALKPHOS 64 04/29/2017 1438   BILITOT 0.5 05/11/2018 1442   BILITOT 0.34 04/29/2017 1438   GFRNONAA >60 05/11/2018 1442   GFRAA >60 05/11/2018 1442    I No results found for: SPEP  Lab Results  Component Value Date   WBC 3.4 (L) 05/11/2018   NEUTROABS 1.6 (L) 05/11/2018   HGB 11.7 (L) 05/11/2018   HCT 33.9 (L) 05/11/2018   MCV 91.4 05/11/2018   PLT 170 05/11/2018      Chemistry  Component Value Date/Time   NA 141 05/11/2018 1442   NA 142 04/29/2017 1438   K 4.0 05/11/2018 1442   K 3.9 04/29/2017 1438   CL 107 05/11/2018 1442   CO2 25 05/11/2018 1442   CO2 23 04/29/2017 1438     BUN 14 05/11/2018 1442   BUN 20.3 04/29/2017 1438   CREATININE 0.83 05/11/2018 1442   CREATININE 1.1 04/29/2017 1438      Component Value Date/Time   CALCIUM 9.2 05/11/2018 1442   CALCIUM 9.3 04/29/2017 1438   ALKPHOS 109 05/11/2018 1442   ALKPHOS 64 04/29/2017 1438   AST 20 05/11/2018 1442   AST 16 04/29/2017 1438   ALT 20 05/11/2018 1442   ALT 10 04/29/2017 1438   BILITOT 0.5 05/11/2018 1442   BILITOT 0.34 04/29/2017 1438       No results found for: LABCA2  No components found for: LABCA125  No results for input(s): INR in the last 168 hours.  Urinalysis    Component Value Date/Time   COLORURINE YELLOW 02/18/2015 0510   APPEARANCEUR CLOUDY (A) 02/18/2015 0510   LABSPEC 1.018 02/18/2015 0510   LABSPEC 1.010 07/10/2014 1004   PHURINE 5.0 02/18/2015 0510   GLUCOSEU NEGATIVE 02/18/2015 0510   GLUCOSEU Negative 07/10/2014 1004   HGBUR SMALL (A) 02/18/2015 0510   BILIRUBINUR NEGATIVE 02/18/2015 0510   BILIRUBINUR Negative 07/10/2014 1004   KETONESUR NEGATIVE 02/18/2015 0510   PROTEINUR NEGATIVE 02/18/2015 0510   UROBILINOGEN 0.2 02/18/2015 0510   UROBILINOGEN 0.2 07/10/2014 1004   NITRITE NEGATIVE 02/18/2015 0510   LEUKOCYTESUR MODERATE (A) 02/18/2015 0510   LEUKOCYTESUR Negative 07/10/2014 1004    STUDIES: No results found.  ASSESSMENT: 57 y.o. BRCA negative [place] woman status post left breast Upper outer quadrant biopsy 02/22/2014 for a clinical T2/T3 NX, stage II or III invasive ductal carcinoma, grade 3, estrogen and progesterone receptor negative, with an MIB-1 of 83%, and HER-2 amplified with a signals a ratio of 2.07and a copy number per cell of 4.75  (1) biopsy of an additional area of enhancement in the left breast 03/08/2014 showed ductal carcinoma in situ, estrogen and progesterone receptor negative.  (2) status post bilateral mastectomies with left sentinel lymph node sampling 04/02/2014, showing:  (a) on the right, benign breast tissue  including a single negative lymph node  (b) On  the left, a  pT1c pN0, stage IA invasive ductal carcinoma, grade 3, with negative margins  (3) adjuvant chemotherapy started 05/08/2014, consisting of carboplatin and docetaxel given every 3 weeks x6, together with trastuzumab, with neulasta day 2   (a) docetaxel removed from final cycle 08/21/2014 because of persistent neuropathy symptoms  (4) trastuzumab continued to complete a year (last dose 04/30/2015)  (a) echo 02/04/15 showed a well-preserved ejection fraction  (5) reconstruction with bilateral silicone implants 95/03/3266 ([place])  (6) tobacco abuse: The patient quit smoking 03/30/2014, resumed September 2018  (7) peripheral neuropathy secondary to chemotherapy: chiefly involving feet  (8) lung nodules resolved on 01/24/15 CT scan  (9) pelvic/ abdominal pain: extensive evaluation shows no evidence of metastatic disease but are consistent with a rectovaginal fistula; 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  (10) chronic kidney disease level III:  PLAN: Stacy Fernandez is now a little over 4 years out from definitive surgery for her breast cancer with no evidence of disease recurrence.  This is very favorable.  I think the discomfort she is having in the chest wall and left axilla is musculoskeletal and related to her earlier surgeries.  I really  do not palpate or all see anything suspicious for cancer recurrence.  We discussed obtaining an MRI of the breast, but this is quite expensive and she would prefer not to spend the money unless we have something very specific to evaluate.  Again discussed smoking cessation.  I also tried to find out if her implants were smooth are ridged.  We are trying to work out that question.  We do have the card she was given at the time of the surgery but it does not specify.  She will see me again one last time a year from now.  Assuming all goes well she will be ready to "graduate" at that time  Magrinat, Virgie Dad, MD   05/11/18 4:21 PM Medical Oncology and Hematology [facility] [place], [place] [address] Tel. [phone]    Fax. [phone]    I, Soijett Blue am acting as scribe for Dr. Sarajane Jews C. Magrinat.  I, Lurline Del MD, have reviewed the above documentation for accuracy and completeness, and I agree with the above.

## 2018-05-11 ENCOUNTER — Inpatient Hospital Stay: Payer: BLUE CROSS/BLUE SHIELD | Attending: Oncology | Admitting: Oncology

## 2018-05-11 ENCOUNTER — Telehealth: Payer: Self-pay | Admitting: Oncology

## 2018-05-11 ENCOUNTER — Inpatient Hospital Stay: Payer: BLUE CROSS/BLUE SHIELD

## 2018-05-11 VITALS — BP 105/70 | HR 78 | Temp 98.0°F | Resp 18 | Ht 66.5 in | Wt 160.5 lb

## 2018-05-11 DIAGNOSIS — N183 Chronic kidney disease, stage 3 (moderate): Secondary | ICD-10-CM

## 2018-05-11 DIAGNOSIS — Z853 Personal history of malignant neoplasm of breast: Secondary | ICD-10-CM | POA: Diagnosis not present

## 2018-05-11 DIAGNOSIS — Z171 Estrogen receptor negative status [ER-]: Secondary | ICD-10-CM

## 2018-05-11 DIAGNOSIS — C50412 Malignant neoplasm of upper-outer quadrant of left female breast: Secondary | ICD-10-CM

## 2018-05-11 LAB — CBC WITH DIFFERENTIAL/PLATELET
ABS IMMATURE GRANULOCYTES: 0 10*3/uL (ref 0.00–0.07)
BASOS PCT: 1 %
Basophils Absolute: 0 10*3/uL (ref 0.0–0.1)
Eosinophils Absolute: 0.1 10*3/uL (ref 0.0–0.5)
Eosinophils Relative: 2 %
HCT: 33.9 % — ABNORMAL LOW (ref 36.0–46.0)
HEMOGLOBIN: 11.7 g/dL — AB (ref 12.0–15.0)
IMMATURE GRANULOCYTES: 0 %
LYMPHS PCT: 41 %
Lymphs Abs: 1.4 10*3/uL (ref 0.7–4.0)
MCH: 31.5 pg (ref 26.0–34.0)
MCHC: 34.5 g/dL (ref 30.0–36.0)
MCV: 91.4 fL (ref 80.0–100.0)
MONO ABS: 0.3 10*3/uL (ref 0.1–1.0)
MONOS PCT: 10 %
NEUTROS ABS: 1.6 10*3/uL — AB (ref 1.7–7.7)
NEUTROS PCT: 46 %
PLATELETS: 170 10*3/uL (ref 150–400)
RBC: 3.71 MIL/uL — AB (ref 3.87–5.11)
RDW: 12.5 % (ref 11.5–15.5)
WBC: 3.4 10*3/uL — AB (ref 4.0–10.5)
nRBC: 0 % (ref 0.0–0.2)

## 2018-05-11 LAB — COMPREHENSIVE METABOLIC PANEL
ALT: 20 U/L (ref 0–44)
ANION GAP: 9 (ref 5–15)
AST: 20 U/L (ref 15–41)
Albumin: 4.1 g/dL (ref 3.5–5.0)
Alkaline Phosphatase: 109 U/L (ref 38–126)
BILIRUBIN TOTAL: 0.5 mg/dL (ref 0.3–1.2)
BUN: 14 mg/dL (ref 6–20)
CHLORIDE: 107 mmol/L (ref 98–111)
CO2: 25 mmol/L (ref 22–32)
Calcium: 9.2 mg/dL (ref 8.9–10.3)
Creatinine, Ser: 0.83 mg/dL (ref 0.44–1.00)
GFR calc Af Amer: >60 mL/min (ref >60)
GLUCOSE: 85 mg/dL (ref 70–99)
POTASSIUM: 4 mmol/L (ref 3.5–5.1)
Sodium: 141 mmol/L (ref 135–145)
TOTAL PROTEIN: 7 g/dL (ref 6.5–8.1)

## 2018-05-11 NOTE — Telephone Encounter (Signed)
Gave avs and calendar ° °

## 2018-05-12 DIAGNOSIS — M5030 Other cervical disc degeneration, unspecified cervical region: Secondary | ICD-10-CM | POA: Diagnosis not present

## 2018-05-12 DIAGNOSIS — M25512 Pain in left shoulder: Secondary | ICD-10-CM | POA: Diagnosis not present

## 2018-05-12 DIAGNOSIS — M542 Cervicalgia: Secondary | ICD-10-CM | POA: Diagnosis not present

## 2018-05-16 DIAGNOSIS — M5030 Other cervical disc degeneration, unspecified cervical region: Secondary | ICD-10-CM | POA: Diagnosis not present

## 2018-07-29 ENCOUNTER — Other Ambulatory Visit: Payer: Self-pay

## 2018-07-29 ENCOUNTER — Emergency Department (HOSPITAL_BASED_OUTPATIENT_CLINIC_OR_DEPARTMENT_OTHER)
Admission: EM | Admit: 2018-07-29 | Discharge: 2018-07-29 | Disposition: A | Discharge status: Home / Self Care | Payer: BLUE CROSS/BLUE SHIELD | Attending: Emergency Medicine | Admitting: Emergency Medicine

## 2018-07-29 ENCOUNTER — Encounter (HOSPITAL_BASED_OUTPATIENT_CLINIC_OR_DEPARTMENT_OTHER): Payer: Self-pay | Admitting: *Deleted

## 2018-07-29 DIAGNOSIS — W57XXXA Bitten or stung by nonvenomous insect and other nonvenomous arthropods, initial encounter: Secondary | ICD-10-CM | POA: Insufficient documentation

## 2018-07-29 DIAGNOSIS — Y9389 Activity, other specified: Secondary | ICD-10-CM | POA: Diagnosis not present

## 2018-07-29 DIAGNOSIS — Z9221 Personal history of antineoplastic chemotherapy: Secondary | ICD-10-CM | POA: Insufficient documentation

## 2018-07-29 DIAGNOSIS — N183 Chronic kidney disease, stage 3 (moderate): Secondary | ICD-10-CM | POA: Insufficient documentation

## 2018-07-29 DIAGNOSIS — Y929 Unspecified place or not applicable: Secondary | ICD-10-CM | POA: Diagnosis not present

## 2018-07-29 DIAGNOSIS — J449 Chronic obstructive pulmonary disease, unspecified: Secondary | ICD-10-CM | POA: Insufficient documentation

## 2018-07-29 DIAGNOSIS — Z9013 Acquired absence of bilateral breasts and nipples: Secondary | ICD-10-CM | POA: Diagnosis not present

## 2018-07-29 DIAGNOSIS — S60561A Insect bite (nonvenomous) of right hand, initial encounter: Secondary | ICD-10-CM | POA: Diagnosis not present

## 2018-07-29 DIAGNOSIS — F17210 Nicotine dependence, cigarettes, uncomplicated: Secondary | ICD-10-CM | POA: Insufficient documentation

## 2018-07-29 DIAGNOSIS — Y999 Unspecified external cause status: Secondary | ICD-10-CM | POA: Diagnosis not present

## 2018-07-29 DIAGNOSIS — E039 Hypothyroidism, unspecified: Secondary | ICD-10-CM | POA: Diagnosis not present

## 2018-07-29 DIAGNOSIS — Z853 Personal history of malignant neoplasm of breast: Secondary | ICD-10-CM | POA: Diagnosis not present

## 2018-07-29 DIAGNOSIS — I2510 Atherosclerotic heart disease of native coronary artery without angina pectoris: Secondary | ICD-10-CM | POA: Insufficient documentation

## 2018-07-29 DIAGNOSIS — S6991XA Unspecified injury of right wrist, hand and finger(s), initial encounter: Secondary | ICD-10-CM | POA: Diagnosis not present

## 2018-07-29 NOTE — ED Triage Notes (Signed)
Pt noticed that her right hand began to swell, burn, itch and turn blue on her way to work this morning.

## 2018-07-29 NOTE — ED Notes (Signed)
Pt updated as to pending discharge. MD made aware.

## 2018-07-29 NOTE — ED Provider Notes (Signed)
[facility] EMERGENCY DEPARTMENT Provider Note   CSN: [id number] Arrival date & time: 07/29/18  0841     History   Chief Complaint Chief Complaint  Patient presents with  . Hand Pain    HPI Stacy Fernandez is a 58 y.o. female.  Patient was putting a glove on her right hand at about 3 this morning getting ready to go to work.  Where she felt kind of a stinging sensation.  She removed the glove and she saw a white spot around her metacarpal area.  Between her index and middle finger.  Then sort of turned blue she got swelling had a burning sensation and itching sensation.  Went on to develop an area of swelling and redness and bruising measuring about 4 cm.  Also had some swelling to the back of her hand.  No chest pain no pain to the proximal part of the arm.  No fevers.  No shortness of breath.     Past Medical History:  Diagnosis Date  . Anxiety   . 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 Chemotherapy-induced peripheral neuropathy (HCC)    FEET  . 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 CKD (chronic kidney disease), stage III ([place])   . 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 History of chemotherapy    left breast ca-- 05-09-2015   to 04-30-2015  . History of gastroesophageal reflux (GERD)    no problems since Nissen fundoplication  . Hypothyroidism   . Rectovaginal fistula   . Skin changes related to chemotherapy    ARMS  . Supraclinoid carotid artery aneurysm, small neurologist-  dr willis/  dr Krista Blue   left internal - 2 mm;  aneurysm  vs.  infundibulum--  residual chronic migraine  . Wears dentures    upper  . Wears glasses     Patient Active Problem List   Diagnosis Date  Noted  . Vertigo 11/30/2016  . 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 Surgery follow-up 07/17/2015  . Genetic testing 06/03/2015  . Pain in joint, pelvic region and thigh 04/30/2015  . Brain aneurysm 04/25/2015  . Back pain 04/19/2015  . Renal insufficiency 04/19/2015  . Lung nodule 12/11/2014  . Ankle edema 10/02/2014  . Acquired absence of breast and nipple 09/25/2014  . Neuropathy due to chemotherapeutic drug ([place]) 08/07/2014  . Chemotherapy induced neutropenia ([place]) 08/07/2014  . Insomnia 07/31/2014  . 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 Atherosclerosis 07/17/2014  . Irregular bowel habits 06/27/2014  . Mucositis due to chemotherapy 06/05/2014  . Diarrhea 05/29/2014  . Pain at surgical site 05/07/2014  . Family history of malignant neoplasm of gastrointestinal tract 03/07/2014  . Family history of malignant neoplasm of breast 03/07/2014  . Family history of malignant neoplasm of ovary 03/07/2014  . Malignant neoplasm of upper-outer quadrant of left breast in female, estrogen receptor negative ([place]) 02/26/2014  . CAD (coronary artery disease) 04/16/2013  . Intractable migraine without aura 02/16/2013  . COPD exacerbation ([place]) 02/01/2013  . S/P Nissen fundoplication (without gastrostomy tube) procedure 01/05/2013  . COPD (chronic obstructive pulmonary disease) ([place]) 01/03/2013  . Chest pain 12/25/2012  .  Hypothyroidism 12/25/2012  . Tobacco abuse 12/25/2012    Past Surgical History:  Procedure Laterality Date  . APPENDECTOMY  1992  . BREAST RECONSTRUCTION WITH PLACEMENT OF TISSUE EXPANDER AND FLEX HD (ACELLULAR HYDRATED DERMIS) Bilateral 09/25/2014   Procedure: PLACEMENT OF BILATERAL TISSUE EXPANDER AND  ACELLULAR DERMIS FOR BREAST RECONSTRUCTION ;  Surgeon: [name], MD;  Location: [facility];  Service: Plastics;  Laterality: Bilateral;  . CHOLECYSTECTOMY  1999  . ESOPHAGOGASTRODUODENOSCOPY (EGD) WITH PROPOFOL  01/18/2013  . LAPAROSCOPIC NISSEN  FUNDOPLICATION  [id number]  . LEFT HEART CATHETERIZATION WITH CORONARY ANGIOGRAM N/A 12/26/2012   Procedure: LEFT HEART CATHETERIZATION WITH CORONARY ANGIOGRAM;  Surgeon: Lorretta Harp, MD;  Location: [facility] CATH LAB;  Service: Cardiovascular;  Laterality: N/A;   Nonobstructive CAD/  40-50% ostial D1/  normal LVF, ef 60%  . LIPOSUCTION WITH LIPOFILLING Bilateral 12/18/2014   Procedure: LIPOFILLING TO BILATERAL CHEST;  Surgeon: [name], MD;  Location: [facility];  Service: Plastics;  Laterality: Bilateral;  . PORT-A-CATH REMOVAL Right 06/28/2015   Procedure: REMOVAL PORT-A-CATH;  Surgeon: Leighton Ruff, MD;  Location: [facility];  Service: General;  Laterality: Right;  . PORTACATH PLACEMENT N/A 04/02/2014   Procedure: INSERTION PORT-A-CATH;  Surgeon: [name], MD;  Location: [place];  Service: General;  Laterality: N/A;  . REMOVAL OF BILATERAL TISSUE EXPANDERS WITH PLACEMENT OF BILATERAL BREAST IMPLANTS Bilateral 12/18/2014   Procedure: REMOVAL OF BILATERAL TISSUE EXPANDERS,PLACEMENT SILICONE IMPLANTS ;  Surgeon: [name], MD;  Location: [facility];  Service: Plastics;  Laterality: Bilateral;  . SIMPLE MASTECTOMY WITH AXILLARY SENTINEL NODE BIOPSY Bilateral 04/02/2014   Procedure: LEFT TOTAL MASTECTOMY WITH LEFT AXILLARY SENTINEL NODE BIOPSY, RIGHT PROPHYLACTIC MASTETCTOMY;  Surgeon: [name], MD;  Location: [place];  Service: General;  Laterality: Bilateral;  . TOTAL ABDOMINAL HYSTERECTOMY W/ BILATERAL SALPINGOOPHORECTOMY  1996  . TRANSTHORACIC ECHOCARDIOGRAM  02-04-2015   grade 1 diastolic dysfunction, ef 40-34%/  trivial TR  . VESICO-VAGINAL FISTULA REPAIR N/A 07/17/2015   Procedure: REPAIR OF RECTOVAGINAL FISTULA;  Surgeon: [name], MD;  Location: [facility];  Service: Gynecology;  Laterality: N/A;  . VIDEO BRONCHOSCOPY Bilateral 01/16/2013   Procedure: VIDEO BRONCHOSCOPY WITHOUT FLUORO;  Surgeon: [name], MD;  Location: [facility]  ENDOSCOPY;  Service: Cardiopulmonary;  Laterality: Bilateral;     OB History   No obstetric history on file.      Home Medications    Prior to Admission medications   Medication Sig Start Date End Date Taking? Authorizing Provider  Diclofenac Potassium (CAMBIA) 50 MG PACK Take 50 mg by mouth as needed. 11/30/16   Marcial Pacas, MD  divalproex (DEPAKOTE ER) 500 MG 24 hr tablet TAKE 2 TABLETS BY MOUTH AT BEDTIME 07/08/17   Marcial Pacas, MD  DUONEB 0.5-2.5 (3) MG/3ML SOLN Inhale 3 mLs into the lungs every 6 (six) hours as needed (for shortness of breath). Reported on 07/08/2015 09/17/14   [provider]  furosemide (LASIX) 20 MG tablet Take 0.5 tablets (10 mg total) by mouth every morning. Patient not taking: Reported on 06/07/2017 74/2/59   Leighton Ruff, MD  levothyroxine (SYNTHROID, LEVOTHROID) 125 MCG tablet Take 125 mcg by mouth daily before breakfast.    [provider]  montelukast (SINGULAIR) 10 MG tablet Take 10 mg by mouth at bedtime.  11/09/13   [provider]  nortriptyline (PAMELOR) 25 MG capsule Take 2 capsules (50 mg total) by mouth at bedtime. 11/30/16   Marcial Pacas, MD  potassium chloride SA (KLOR-CON M20) 20 MEQ tablet TAKE 1 TABLET (20 MEQ TOTAL) BY MOUTH ONCE DAY--- TAKES IN AM Patient not taking: Reported on 06/07/2017 94/8/54   Leighton Ruff, MD  PROAIR HFA 108 (90 BASE) MCG/ACT inhaler Inhale 2 puffs into the lungs every 4 (four) hours as needed for wheezing or shortness of breath. Reported on 07/08/2015 01/19/13   [provider]  SUMAtriptan (IMITREX) 100 MG tablet Take 1 tablet (100 mg total) by mouth once as needed for migraine. May repeat in 2 hours if headache persists or recurs. 11/30/16   Marcial Pacas, MD  traMADol (ULTRAM) 50 MG tablet Take 1-2 tablets (50-100 mg total) by mouth every 6 (six) hours as needed. 11/02/17   Magrinat, Virgie Dad, MD  traZODone (DESYREL) 100 MG tablet TAKE 1 TABLET (100 MG TOTAL) BY MOUTH AT BEDTIME. 03/30/16    Magrinat, Virgie Dad, MD  umeclidinium-vilanterol (ANORO ELLIPTA) 62.5-25 MCG/INH AEPB Inhale 1 puff into the lungs daily. 01/07/17   [name], MD    Family History Family History  Problem Relation Age of Onset  . Heart failure Mother   . Colon cancer Father 31       stomach cancer also in 14s  . Throat cancer Brother 2       smoker  . Heart attack Maternal Grandmother   . Colon cancer Paternal Grandmother 20  . Cancer Paternal Grandfather        kidney and bladder  . Ovarian cancer Sister 60       ovarian cancer at 37, colorectal cancer at 34  . Throat cancer Brother 37       throat cancer, smoker  . Breast cancer Paternal Aunt 41  . Ovarian cancer Other 57       niece with ovarian cancer    Social History Social History   Tobacco Use  . Smoking status: Current Every Day Smoker    Packs/day: 0.00    Years: 37.00    Pack years: 0.00    Types: Cigarettes, E-cigarettes    Last attempt to quit: 03/29/2014    Years since quitting: 4.3  . Smokeless tobacco: Never Used  . Tobacco comment: 5-7 cigs daily 06/07/17  Substance Use Topics  . Alcohol use: No  . Drug use: No     Allergies   Prednisone   Review of Systems Review of Systems  Constitutional: Negative for chills and fever.  HENT: Negative for rhinorrhea and sore throat.   Eyes: Negative for visual disturbance.  Respiratory: Negative for cough, chest tightness and shortness of breath.   Cardiovascular: Negative for chest pain and leg swelling.  Gastrointestinal: Negative for abdominal pain, diarrhea, nausea and vomiting.  Genitourinary: Negative for dysuria.  Musculoskeletal: Positive for joint swelling. Negative for back pain and neck pain.  Skin: Negative for rash and wound.  Neurological: Negative for dizziness, light-headedness and headaches.  Hematological: Does not bruise/bleed easily.  Psychiatric/Behavioral: Negative for confusion.     Physical Exam Updated Vital Signs BP (!) 134/99 (BP  Location: Right Arm)   Pulse 80   Temp 97.9 F (36.6 C)   Resp 18   Ht 1.676 m (5\' 6" )   Wt 68.5 kg   SpO2 100%   BMI 24.37 kg/m   Physical Exam Vitals signs and nursing note reviewed.  Constitutional:      General: She is not in acute distress.    Appearance: Normal appearance. She is well-developed. She is not ill-appearing, toxic-appearing or  diaphoretic.  HENT:     Head: Normocephalic and atraumatic.     Mouth/Throat:     Mouth: Mucous membranes are moist.  Eyes:     Extraocular Movements: Extraocular movements intact.     Conjunctiva/sclera: Conjunctivae normal.     Pupils: Pupils are equal, round, and reactive to light.  Neck:     Musculoskeletal: Normal range of motion and neck supple.  Cardiovascular:     Rate and Rhythm: Normal rate and regular rhythm.     Heart sounds: No murmur.  Pulmonary:     Effort: Pulmonary effort is normal. No respiratory distress.     Breath sounds: Normal breath sounds. No wheezing.  Abdominal:     General: Bowel sounds are normal.     Palpations: Abdomen is soft.     Tenderness: There is no abdominal tenderness.  Musculoskeletal:        General: Swelling present.     Comments: Right hand with about a 4 cm area of some erythema and bruising between the index and middle finger metacarpal area on the dorsum of the hand.  Cap refill distally is less than 2 seconds.  Good range of motion.  There is some swelling to the dorsum of the hand that goes towards the wrist area.  But no erythema extending into the wrist area.  No evidence of any distinct bite mark.  Sensation intact.  Good range of motion.  No significant pain with range of motion of the fingers.  Radial pulses 2+.  Skin:    General: Skin is warm and dry.     Capillary Refill: Capillary refill takes less than 2 seconds.     Findings: Bruising and erythema present.  Neurological:     General: No focal deficit present.     Mental Status: She is alert and oriented to person, place,  and time.     Sensory: No sensory deficit.     Motor: No weakness.      ED Treatments / Results  Labs (all labs ordered are listed, but only abnormal results are displayed) Labs Reviewed - No data to display  EKG None  Radiology No results found.  Procedures Procedures (including critical care time)  Medications Ordered in ED Medications - No data to display   Initial Impression / Assessment and Plan / ED Course  I have reviewed the triage vital signs and the nursing notes.  Pertinent labs & imaging results that were available during my care of the patient were reviewed by me and considered in my medical decision making (see chart for details).     Most likely patient had an insect bite to the back of her right hand.  No evidence of any neurological symptoms do not think it was a black widow spider.  Most likely culprit was a spider however.  Patient nontoxic no acute distress.  No evidence of any significant infection at this time.  Patient wants to return to work today work note provided to return today.  No antibiotics indicated at this time.  Recommend elevation of the hand.  Patient will return for any new or worse symptoms.  Final Clinical Impressions(s) / ED Diagnoses   Final diagnoses:  Insect bite of right hand, initial encounter    ED Discharge Orders    None       Fredia Sorrow, MD 07/29/18 1047

## 2018-07-29 NOTE — Discharge Instructions (Signed)
Suspect insect bite to the right hand.  Return for any new or worse symptoms spreading of the redness or any body symptoms of feeling bad.  Elevation of the right hand is much as possible would be helpful.  Work note provided.

## 2018-07-29 NOTE — ED Notes (Signed)
ED Provider at bedside. 

## 2018-08-03 ENCOUNTER — Other Ambulatory Visit: Payer: Self-pay | Admitting: *Deleted

## 2018-08-03 ENCOUNTER — Other Ambulatory Visit: Payer: Self-pay | Admitting: Oncology

## 2018-08-03 MED ORDER — TRAMADOL HCL 50 MG PO TABS: 50.0000 mg | ORAL_TABLET | Freq: Four times a day (QID) | ORAL | 3 refills | Status: DC | PRN

## 2018-08-03 NOTE — Telephone Encounter (Signed)
Pt left VM requesting refill on Tramadol Pt is using for CIPN secondary to history of breast cancer. Last fill was Oct 2019 # 90.  MD controlled substance refill request given to MD.

## 2018-08-04 ENCOUNTER — Telehealth: Payer: Self-pay | Admitting: *Deleted

## 2018-08-04 NOTE — Telephone Encounter (Signed)
Call received from [name] in reference to Tramadol.  Call transferred to told to call there for tramadol refill.  Called, left messages but have not heard from anyone yet."   Refill sent yesterday afternoon.  Confirmed correct pharmacy.  Denies further needs or questions at this time.

## 2018-08-30 DIAGNOSIS — Z79899 Other long term (current) drug therapy: Secondary | ICD-10-CM | POA: Diagnosis not present

## 2018-08-30 DIAGNOSIS — J209 Acute bronchitis, unspecified: Secondary | ICD-10-CM | POA: Diagnosis not present

## 2018-08-30 DIAGNOSIS — J440 Chronic obstructive pulmonary disease with acute lower respiratory infection: Secondary | ICD-10-CM | POA: Diagnosis not present

## 2018-08-30 DIAGNOSIS — J0110 Acute frontal sinusitis, unspecified: Secondary | ICD-10-CM | POA: Diagnosis not present

## 2018-08-30 DIAGNOSIS — F3341 Major depressive disorder, recurrent, in partial remission: Secondary | ICD-10-CM | POA: Diagnosis not present

## 2018-09-15 DIAGNOSIS — Z853 Personal history of malignant neoplasm of breast: Secondary | ICD-10-CM | POA: Diagnosis not present

## 2018-09-15 DIAGNOSIS — Z9013 Acquired absence of bilateral breasts and nipples: Secondary | ICD-10-CM | POA: Diagnosis not present

## 2018-09-27 DIAGNOSIS — Z13220 Encounter for screening for lipoid disorders: Secondary | ICD-10-CM | POA: Diagnosis not present

## 2018-09-27 DIAGNOSIS — E039 Hypothyroidism, unspecified: Secondary | ICD-10-CM | POA: Diagnosis not present

## 2018-09-27 DIAGNOSIS — J440 Chronic obstructive pulmonary disease with acute lower respiratory infection: Secondary | ICD-10-CM | POA: Diagnosis not present

## 2018-09-27 DIAGNOSIS — F3341 Major depressive disorder, recurrent, in partial remission: Secondary | ICD-10-CM | POA: Diagnosis not present

## 2018-09-27 DIAGNOSIS — Z0000 Encounter for general adult medical examination without abnormal findings: Secondary | ICD-10-CM | POA: Diagnosis not present

## 2018-10-19 DIAGNOSIS — J440 Chronic obstructive pulmonary disease with acute lower respiratory infection: Secondary | ICD-10-CM | POA: Diagnosis not present

## 2018-10-19 DIAGNOSIS — J0140 Acute pansinusitis, unspecified: Secondary | ICD-10-CM | POA: Diagnosis not present

## 2018-10-21 DIAGNOSIS — Z20828 Contact with and (suspected) exposure to other viral communicable diseases: Secondary | ICD-10-CM | POA: Diagnosis not present

## 2018-10-21 DIAGNOSIS — R6889 Other general symptoms and signs: Secondary | ICD-10-CM | POA: Diagnosis not present

## 2018-10-24 ENCOUNTER — Inpatient Hospital Stay (HOSPITAL_COMMUNITY)
Admission: EM | Admit: 2018-10-24 | Discharge: 2018-10-29 | DRG: 177 | Disposition: A | Discharge status: Home / Self Care | Payer: BLUE CROSS/BLUE SHIELD | Attending: Internal Medicine | Admitting: Internal Medicine

## 2018-10-24 ENCOUNTER — Emergency Department (HOSPITAL_COMMUNITY): Payer: BLUE CROSS/BLUE SHIELD

## 2018-10-24 ENCOUNTER — Encounter (HOSPITAL_COMMUNITY): Payer: Self-pay

## 2018-10-24 ENCOUNTER — Other Ambulatory Visit: Payer: Self-pay

## 2018-10-24 DIAGNOSIS — Z800 Family history of malignant neoplasm of digestive organs: Secondary | ICD-10-CM | POA: Diagnosis not present

## 2018-10-24 DIAGNOSIS — R069 Unspecified abnormalities of breathing: Secondary | ICD-10-CM | POA: Diagnosis not present

## 2018-10-24 DIAGNOSIS — J9601 Acute respiratory failure with hypoxia: Secondary | ICD-10-CM | POA: Diagnosis present

## 2018-10-24 DIAGNOSIS — J449 Chronic obstructive pulmonary disease, unspecified: Secondary | ICD-10-CM | POA: Diagnosis not present

## 2018-10-24 DIAGNOSIS — Z9221 Personal history of antineoplastic chemotherapy: Secondary | ICD-10-CM

## 2018-10-24 DIAGNOSIS — J1289 Other viral pneumonia: Secondary | ICD-10-CM | POA: Diagnosis not present

## 2018-10-24 DIAGNOSIS — Z808 Family history of malignant neoplasm of other organs or systems: Secondary | ICD-10-CM | POA: Diagnosis not present

## 2018-10-24 DIAGNOSIS — E039 Hypothyroidism, unspecified: Secondary | ICD-10-CM | POA: Diagnosis present

## 2018-10-24 DIAGNOSIS — N183 Chronic kidney disease, stage 3 (moderate): Secondary | ICD-10-CM | POA: Diagnosis not present

## 2018-10-24 DIAGNOSIS — Z8041 Family history of malignant neoplasm of ovary: Secondary | ICD-10-CM

## 2018-10-24 DIAGNOSIS — K219 Gastro-esophageal reflux disease without esophagitis: Secondary | ICD-10-CM | POA: Diagnosis present

## 2018-10-24 DIAGNOSIS — Z803 Family history of malignant neoplasm of breast: Secondary | ICD-10-CM | POA: Diagnosis not present

## 2018-10-24 DIAGNOSIS — Z79891 Long term (current) use of opiate analgesic: Secondary | ICD-10-CM

## 2018-10-24 DIAGNOSIS — Z853 Personal history of malignant neoplasm of breast: Secondary | ICD-10-CM

## 2018-10-24 DIAGNOSIS — E876 Hypokalemia: Secondary | ICD-10-CM | POA: Diagnosis present

## 2018-10-24 DIAGNOSIS — Z79890 Hormone replacement therapy (postmenopausal): Secondary | ICD-10-CM | POA: Diagnosis not present

## 2018-10-24 DIAGNOSIS — F419 Anxiety disorder, unspecified: Secondary | ICD-10-CM | POA: Diagnosis present

## 2018-10-24 DIAGNOSIS — J439 Emphysema, unspecified: Secondary | ICD-10-CM | POA: Diagnosis present

## 2018-10-24 DIAGNOSIS — J189 Pneumonia, unspecified organism: Secondary | ICD-10-CM

## 2018-10-24 DIAGNOSIS — R000 Tachycardia, unspecified: Secondary | ICD-10-CM | POA: Diagnosis not present

## 2018-10-24 DIAGNOSIS — Z8249 Family history of ischemic heart disease and other diseases of the circulatory system: Secondary | ICD-10-CM | POA: Diagnosis not present

## 2018-10-24 DIAGNOSIS — R0602 Shortness of breath: Secondary | ICD-10-CM

## 2018-10-24 DIAGNOSIS — Z888 Allergy status to other drugs, medicaments and biological substances status: Secondary | ICD-10-CM | POA: Diagnosis not present

## 2018-10-24 DIAGNOSIS — R0600 Dyspnea, unspecified: Secondary | ICD-10-CM | POA: Diagnosis not present

## 2018-10-24 DIAGNOSIS — Z9049 Acquired absence of other specified parts of digestive tract: Secondary | ICD-10-CM

## 2018-10-24 DIAGNOSIS — Z9013 Acquired absence of bilateral breasts and nipples: Secondary | ICD-10-CM

## 2018-10-24 DIAGNOSIS — Z79899 Other long term (current) drug therapy: Secondary | ICD-10-CM

## 2018-10-24 DIAGNOSIS — R0902 Hypoxemia: Secondary | ICD-10-CM

## 2018-10-24 DIAGNOSIS — R6889 Other general symptoms and signs: Secondary | ICD-10-CM

## 2018-10-24 DIAGNOSIS — Z20822 Contact with and (suspected) exposure to covid-19: Secondary | ICD-10-CM | POA: Diagnosis present

## 2018-10-24 LAB — COMPREHENSIVE METABOLIC PANEL
ALT: 22 U/L (ref 0–44)
AST: 35 U/L (ref 15–41)
Albumin: 3.5 g/dL (ref 3.5–5.0)
Alkaline Phosphatase: 70 U/L (ref 38–126)
Anion gap: 9 (ref 5–15)
BUN: 20 mg/dL (ref 6–20)
CO2: 19 mmol/L — ABNORMAL LOW (ref 22–32)
Calcium: 8.2 mg/dL — ABNORMAL LOW (ref 8.9–10.3)
Chloride: 110 mmol/L (ref 98–111)
Creatinine, Ser: 0.72 mg/dL (ref 0.44–1.00)
GFR calc Af Amer: >60 mL/min (ref >60)
GFR calc non Af Amer: >60 mL/min (ref >60)
Glucose, Bld: 105 mg/dL — ABNORMAL HIGH (ref 70–99)
Potassium: 3.4 mmol/L — ABNORMAL LOW (ref 3.5–5.1)
Sodium: 138 mmol/L (ref 135–145)
Total Bilirubin: 0.5 mg/dL (ref 0.3–1.2)
Total Protein: 6.8 g/dL (ref 6.5–8.1)

## 2018-10-24 LAB — RESPIRATORY PANEL BY PCR

## 2018-10-24 LAB — CBC WITH DIFFERENTIAL/PLATELET
Abs Immature Granulocytes: 0.03 10*3/uL (ref 0.00–0.07)
Basophils Absolute: 0 10*3/uL (ref 0.0–0.1)
Basophils Relative: 0 %
Eosinophils Absolute: 0 10*3/uL (ref 0.0–0.5)
Eosinophils Relative: 0 %
HCT: 32.4 % — ABNORMAL LOW (ref 36.0–46.0)
Hemoglobin: 11.3 g/dL — ABNORMAL LOW (ref 12.0–15.0)
Immature Granulocytes: 0 %
Lymphocytes Relative: 6 %
Lymphs Abs: 0.5 10*3/uL — ABNORMAL LOW (ref 0.7–4.0)
MCH: 31.6 pg (ref 26.0–34.0)
MCHC: 34.9 g/dL (ref 30.0–36.0)
MCV: 90.5 fL (ref 80.0–100.0)
Monocytes Absolute: 0.2 10*3/uL (ref 0.1–1.0)
Monocytes Relative: 2 %
Neutro Abs: 6.7 10*3/uL (ref 1.7–7.7)
Neutrophils Relative %: 92 %
Platelets: 146 10*3/uL — ABNORMAL LOW (ref 150–400)
RBC: 3.58 MIL/uL — ABNORMAL LOW (ref 3.87–5.11)
RDW: 12.5 % (ref 11.5–15.5)
WBC: 7.4 10*3/uL (ref 4.0–10.5)
nRBC: 0 % (ref 0.0–0.2)

## 2018-10-24 LAB — PROCALCITONIN: Procalcitonin: 0.65 ng/mL

## 2018-10-24 LAB — D-DIMER, QUANTITATIVE: D-Dimer, Quant: 0.83 ug/mL-FEU — ABNORMAL HIGH (ref 0.00–0.50)

## 2018-10-24 LAB — C-REACTIVE PROTEIN: CRP: 11.4 mg/dL — ABNORMAL HIGH (ref <1.0)

## 2018-10-24 LAB — LACTIC ACID, PLASMA
Lactic Acid, Venous: 0.9 mmol/L (ref 0.5–1.9)
Lactic Acid, Venous: 0.9 mmol/L (ref 0.5–1.9)

## 2018-10-24 LAB — INFLUENZA PANEL BY PCR (TYPE A & B)
Influenza A By PCR: NEGATIVE
Influenza B By PCR: NEGATIVE

## 2018-10-24 MED ORDER — SODIUM CHLORIDE 0.9 % IV SOLN
1.0000 g | Freq: Once | INTRAVENOUS | Status: AC
  Administered 2018-10-24: 1 g via INTRAVENOUS
  Filled 2018-10-24: qty 10

## 2018-10-24 MED ORDER — LOPERAMIDE HCL 2 MG PO CAPS
4.0000 mg | ORAL_CAPSULE | Freq: Once | ORAL | Status: AC
  Administered 2018-10-24: 4 mg via ORAL
  Filled 2018-10-24: qty 2

## 2018-10-24 MED ORDER — LEVOTHYROXINE SODIUM 125 MCG PO TABS
125.0000 ug | ORAL_TABLET | Freq: Every day | ORAL | Status: DC
  Administered 2018-10-24 – 2018-10-29 (×6): 125 ug via ORAL
  Filled 2018-10-24 (×6): qty 1

## 2018-10-24 MED ORDER — ALBUTEROL SULFATE HFA 108 (90 BASE) MCG/ACT IN AERS
2.0000 | INHALATION_SPRAY | Freq: Once | RESPIRATORY_TRACT | Status: AC
  Administered 2018-10-24: 03:00:00 2 via RESPIRATORY_TRACT
  Filled 2018-10-24: qty 6.7

## 2018-10-24 MED ORDER — HYDROXYCHLOROQUINE SULFATE 200 MG PO TABS
200.0000 mg | ORAL_TABLET | Freq: Two times a day (BID) | ORAL | Status: AC
  Administered 2018-10-25 – 2018-10-28 (×8): 200 mg via ORAL
  Filled 2018-10-24 (×9): qty 1

## 2018-10-24 MED ORDER — UMECLIDINIUM-VILANTEROL 62.5-25 MCG/INH IN AEPB
1.0000 | INHALATION_SPRAY | Freq: Every day | RESPIRATORY_TRACT | Status: DC
  Administered 2018-10-24 – 2018-10-29 (×6): 1 via RESPIRATORY_TRACT
  Filled 2018-10-24: qty 14

## 2018-10-24 MED ORDER — PROMETHAZINE HCL 25 MG/ML IJ SOLN
25.0000 mg | Freq: Four times a day (QID) | INTRAMUSCULAR | Status: DC | PRN
  Administered 2018-10-24 – 2018-10-28 (×5): 25 mg via INTRAVENOUS
  Filled 2018-10-24 (×6): qty 1

## 2018-10-24 MED ORDER — ENSURE ENLIVE PO LIQD
237.0000 mL | Freq: Two times a day (BID) | ORAL | Status: DC
  Administered 2018-10-25 – 2018-10-28 (×8): 237 mL via ORAL

## 2018-10-24 MED ORDER — POTASSIUM CHLORIDE CRYS ER 20 MEQ PO TBCR
40.0000 meq | EXTENDED_RELEASE_TABLET | Freq: Once | ORAL | Status: AC
  Administered 2018-10-24: 40 meq via ORAL
  Filled 2018-10-24: qty 2

## 2018-10-24 MED ORDER — ZINC SULFATE 220 (50 ZN) MG PO CAPS
220.0000 mg | ORAL_CAPSULE | Freq: Every day | ORAL | Status: DC
  Administered 2018-10-24 – 2018-10-29 (×6): 220 mg via ORAL
  Filled 2018-10-24 (×6): qty 1

## 2018-10-24 MED ORDER — ACETAMINOPHEN 325 MG PO TABS
650.0000 mg | ORAL_TABLET | Freq: Four times a day (QID) | ORAL | Status: DC | PRN
  Administered 2018-10-25 – 2018-10-29 (×5): 650 mg via ORAL
  Filled 2018-10-24 (×5): qty 2

## 2018-10-24 MED ORDER — TRAMADOL HCL 50 MG PO TABS
50.0000 mg | ORAL_TABLET | Freq: Four times a day (QID) | ORAL | Status: DC | PRN
  Administered 2018-10-24: 50 mg via ORAL
  Filled 2018-10-24: qty 1

## 2018-10-24 MED ORDER — SODIUM CHLORIDE 0.9 % IV SOLN
500.0000 mg | Freq: Once | INTRAVENOUS | Status: AC
  Administered 2018-10-24: 500 mg via INTRAVENOUS
  Filled 2018-10-24: qty 500

## 2018-10-24 MED ORDER — ENSURE ENLIVE PO LIQD: 237.0000 mL | Freq: Two times a day (BID) | ORAL | Status: DC

## 2018-10-24 MED ORDER — HYDROXYCHLOROQUINE SULFATE 200 MG PO TABS
400.0000 mg | ORAL_TABLET | Freq: Two times a day (BID) | ORAL | Status: AC
  Administered 2018-10-24 (×2): 400 mg via ORAL
  Filled 2018-10-24 (×2): qty 2

## 2018-10-24 MED ORDER — ONDANSETRON HCL 4 MG PO TABS
4.0000 mg | ORAL_TABLET | Freq: Four times a day (QID) | ORAL | Status: DC | PRN
  Administered 2018-10-24: 4 mg via ORAL
  Filled 2018-10-24: qty 1

## 2018-10-24 MED ORDER — GUAIFENESIN-DM 100-10 MG/5ML PO SYRP
10.0000 mL | ORAL_SOLUTION | ORAL | Status: DC | PRN
  Administered 2018-10-26 – 2018-10-27 (×2): 10 mL via ORAL
  Filled 2018-10-24 (×3): qty 10

## 2018-10-24 MED ORDER — MONTELUKAST SODIUM 10 MG PO TABS
10.0000 mg | ORAL_TABLET | Freq: Every day | ORAL | Status: DC
  Administered 2018-10-24 – 2018-10-28 (×5): 10 mg via ORAL
  Filled 2018-10-24 (×5): qty 1

## 2018-10-24 MED ORDER — LOPERAMIDE HCL 2 MG PO CAPS
2.0000 mg | ORAL_CAPSULE | Freq: Four times a day (QID) | ORAL | Status: DC | PRN
  Administered 2018-10-24 – 2018-10-26 (×5): 2 mg via ORAL
  Filled 2018-10-24 (×5): qty 1

## 2018-10-24 MED ORDER — ONDANSETRON HCL 4 MG/2ML IJ SOLN
4.0000 mg | Freq: Four times a day (QID) | INTRAMUSCULAR | Status: DC | PRN
  Administered 2018-10-24 – 2018-10-28 (×3): 4 mg via INTRAVENOUS
  Filled 2018-10-24 (×4): qty 2

## 2018-10-24 MED ORDER — SODIUM CHLORIDE 0.9 % IV SOLN
INTRAVENOUS | Status: DC
  Administered 2018-10-24 – 2018-10-25 (×3): via INTRAVENOUS

## 2018-10-24 MED ORDER — ENOXAPARIN SODIUM 40 MG/0.4ML ~~LOC~~ SOLN
40.0000 mg | Freq: Every day | SUBCUTANEOUS | Status: DC
  Administered 2018-10-24 – 2018-10-29 (×6): 40 mg via SUBCUTANEOUS
  Filled 2018-10-24 (×6): qty 0.4

## 2018-10-24 MED ORDER — LORATADINE 10 MG PO TABS
10.0000 mg | ORAL_TABLET | Freq: Every day | ORAL | Status: DC
  Administered 2018-10-24 – 2018-10-29 (×6): 10 mg via ORAL
  Filled 2018-10-24 (×6): qty 1

## 2018-10-24 MED ORDER — ALBUTEROL SULFATE HFA 108 (90 BASE) MCG/ACT IN AERS
2.0000 | INHALATION_SPRAY | Freq: Four times a day (QID) | RESPIRATORY_TRACT | Status: DC
  Administered 2018-10-24 – 2018-10-29 (×21): 2 via RESPIRATORY_TRACT
  Filled 2018-10-24: qty 6.7

## 2018-10-24 MED ORDER — HYDROCOD POLST-CPM POLST ER 10-8 MG/5ML PO SUER
5.0000 mL | Freq: Two times a day (BID) | ORAL | Status: DC | PRN
  Administered 2018-10-24 – 2018-10-26 (×4): 5 mL via ORAL
  Filled 2018-10-24 (×3): qty 5

## 2018-10-24 MED ORDER — ALBUTEROL SULFATE HFA 108 (90 BASE) MCG/ACT IN AERS: 2.0000 | INHALATION_SPRAY | RESPIRATORY_TRACT | Status: DC | PRN

## 2018-10-24 MED ORDER — LACTATED RINGERS IV BOLUS
1000.0000 mL | Freq: Once | INTRAVENOUS | Status: AC
  Administered 2018-10-24: 04:00:00 1000 mL via INTRAVENOUS

## 2018-10-24 MED ORDER — VITAMIN C 500 MG PO TABS
500.0000 mg | ORAL_TABLET | Freq: Every day | ORAL | Status: DC
  Administered 2018-10-24 – 2018-10-29 (×6): 500 mg via ORAL
  Filled 2018-10-24 (×7): qty 1

## 2018-10-24 NOTE — ED Notes (Signed)
EKG printed and given to Dr Dayna Barker

## 2018-10-24 NOTE — ED Triage Notes (Signed)
Per [facility]: Pt coming from home c/o progressive shortness of breath, generalized body aches and fever of 101.5 at 6pm. Hx of COPD. Pt's husband tested positive for covid this past Friday and pt has been caring for husband.   Pt on 15L on nonrebreather. Pt not usually on oxygen. Alert and ambulatory   88% on room air now 98% on oxygen   563mL of NS

## 2018-10-24 NOTE — ED Notes (Addendum)
Pt placed on 2L nasal cannula. SpO2 at 96%

## 2018-10-24 NOTE — ED Notes (Signed)
Urine and culture sent to lab as save tubes

## 2018-10-24 NOTE — Plan of Care (Signed)
58 year old female admitted this morning with a history of COPD feeling sick for several days was treated with doxycycline as an outpatient.  Her husband is also my patient with confirmed COVID-19.  At baseline patient does not use oxygen she has fever and hypoxia temp of 102.4 and 86% at home.  She is extremely concerned about herself and her husband.  She is tachycardic chest x-ray shows bilateral infiltrates and she is now needing 2 L of oxygen.  Chest x-ray shows patchy bibasilar opacities right greater than left suspicious for atypical viral infection or pneumonia and emphysema.  Labs are significant for hypokalemia potassium 3.4 CRP is 11.4 procalcitonin is 0.65 lactic acid is 0.9.  D-dimer 0.83.  Her QT is within normal limits.  I will start her on Plaquenil.

## 2018-10-24 NOTE — ED Notes (Signed)
XR at bedside

## 2018-10-24 NOTE — ED Provider Notes (Signed)
Emergency Department Provider Note   I have reviewed the triage vital signs and the nursing notes.   HISTORY  Chief Complaint Shortness of Breath (husband positive for covid )   HPI Stacy Fernandez is a 58 y.o. female with a remote history of breast cancer, chronic kidney disease, COPD who presents the emerge department today with worsening shortness of breath.  Patient does not have a history of oxygen use but over the last few days that progressively worsening shortness of breath this morning she had a fever of 102.4.  Then her dyspnea got significantly worse as she checked her oxygen saturation at home as low as 86% so she called EMS who brought her here for further evaluation.  Patient's husband is currently admitted to the hospital with COVID-19 infection.  She has not been tested.  She tried her inhalers at home that did not help. No other associated or modifying symptoms.    Past Medical History:  Diagnosis Date  . Anxiety   . 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 Chemotherapy-induced peripheral neuropathy (HCC)    FEET  . 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 CKD (chronic kidney disease), stage III ([place])   . COPD with emphysema [facility]) pulmologist--  dr Elsworth Soho   advanded emphysema per CT   . History of chemotherapy    left breast ca-- 05-09-2015   to 04-30-2015  . History of gastroesophageal reflux (GERD)    no problems since Nissen fundoplication  . Hypothyroidism   . Rectovaginal fistula   . Skin changes related to chemotherapy    ARMS  . Supraclinoid carotid artery aneurysm, small neurologist-  dr willis/  dr Krista Blue   left internal - 2 mm;  aneurysm  vs.  infundibulum--  residual chronic migraine  . Wears dentures    upper  . Wears glasses     Patient Active Problem List   Diagnosis Date Noted  . Vertigo 11/30/2016  . 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 Surgery follow-up 07/17/2015  . Genetic testing 06/03/2015  . Pain in joint, pelvic region and thigh 04/30/2015  . Brain aneurysm 04/25/2015  . Back pain 04/19/2015  . Renal insufficiency 04/19/2015  . Lung nodule 12/11/2014  . Ankle edema 10/02/2014  . Acquired absence of breast and nipple 09/25/2014  . Neuropathy due to chemotherapeutic drug ([place]) 08/07/2014  . Chemotherapy induced neutropenia ([place]) 08/07/2014  . Insomnia 07/31/2014  . 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 Atherosclerosis 07/17/2014  . Irregular bowel habits 06/27/2014  . Mucositis due to chemotherapy 06/05/2014  . Diarrhea 05/29/2014  . Pain at surgical site 05/07/2014  . Family history of malignant neoplasm of gastrointestinal tract 03/07/2014  . Family history of malignant neoplasm of breast 03/07/2014  . Family history of malignant neoplasm of ovary 03/07/2014  . Malignant neoplasm of upper-outer quadrant of left breast in female, estrogen receptor negative ([place]) 02/26/2014  . CAD (coronary artery disease) 04/16/2013  . Intractable migraine without aura 02/16/2013  . COPD exacerbation ([place]) 02/01/2013  . S/P Nissen fundoplication (without gastrostomy tube) procedure 01/05/2013  . COPD (chronic obstructive pulmonary disease) ([place]) 01/03/2013  . Chest pain 12/25/2012  . Hypothyroidism 12/25/2012  . Tobacco abuse 12/25/2012  Past Surgical History:  Procedure Laterality Date  . APPENDECTOMY  1992  . BREAST RECONSTRUCTION WITH PLACEMENT OF TISSUE EXPANDER AND FLEX HD (ACELLULAR HYDRATED DERMIS) Bilateral 09/25/2014   Procedure: PLACEMENT OF BILATERAL TISSUE EXPANDER AND  ACELLULAR DERMIS FOR BREAST RECONSTRUCTION ;  Surgeon: [name], MD;  Location: [place];  Service: Plastics;  Laterality: Bilateral;  . CHOLECYSTECTOMY  1999  .  ESOPHAGOGASTRODUODENOSCOPY (EGD) WITH PROPOFOL  01/18/2013  . LAPAROSCOPIC NISSEN FUNDOPLICATION  [id number]  . LEFT HEART CATHETERIZATION WITH CORONARY ANGIOGRAM N/A 12/26/2012   Procedure: LEFT HEART CATHETERIZATION WITH CORONARY ANGIOGRAM;  Surgeon: Lorretta Harp, MD;  Location: [facility] CATH LAB;  Service: Cardiovascular;  Laterality: N/A;   Nonobstructive CAD/  40-50% ostial D1/  normal LVF, ef 60%  . LIPOSUCTION WITH LIPOFILLING Bilateral 12/18/2014   Procedure: LIPOFILLING TO BILATERAL CHEST;  Surgeon: [name], MD;  Location: [place];  Service: Plastics;  Laterality: Bilateral;  . PORT-A-CATH REMOVAL Right 06/28/2015   Procedure: REMOVAL PORT-A-CATH;  Surgeon: Leighton Ruff, MD;  Location: [facility];  Service: General;  Laterality: Right;  . PORTACATH PLACEMENT N/A 04/02/2014   Procedure: INSERTION PORT-A-CATH;  Surgeon: [name], MD;  Location: [place];  Service: General;  Laterality: N/A;  . REMOVAL OF BILATERAL TISSUE EXPANDERS WITH PLACEMENT OF BILATERAL BREAST IMPLANTS Bilateral 12/18/2014   Procedure: REMOVAL OF BILATERAL TISSUE EXPANDERS,PLACEMENT SILICONE IMPLANTS ;  Surgeon: [name], MD;  Location: [place];  Service: Plastics;  Laterality: Bilateral;  . SIMPLE MASTECTOMY WITH AXILLARY SENTINEL NODE BIOPSY Bilateral 04/02/2014   Procedure: LEFT TOTAL MASTECTOMY WITH LEFT AXILLARY SENTINEL NODE BIOPSY, RIGHT PROPHYLACTIC MASTETCTOMY;  Surgeon: [name], MD;  Location: [place];  Service: General;  Laterality: Bilateral;  . TOTAL ABDOMINAL HYSTERECTOMY W/ BILATERAL SALPINGOOPHORECTOMY  1996  . TRANSTHORACIC ECHOCARDIOGRAM  02-04-2015   grade 1 diastolic dysfunction, ef 19-41%/  trivial TR  . VESICO-VAGINAL FISTULA REPAIR N/A 07/17/2015   Procedure: REPAIR OF RECTOVAGINAL FISTULA;  Surgeon: [name], MD;  Location: [facility];  Service: Gynecology;  Laterality: N/A;  . VIDEO BRONCHOSCOPY Bilateral 01/16/2013    Procedure: VIDEO BRONCHOSCOPY WITHOUT FLUORO;  Surgeon: [name], MD;  Location: [facility];  Service: Cardiopulmonary;  Laterality: Bilateral;    Current Outpatient Rx  . Order #: [id number] Class: Normal  . Order #: [id number] Class: Normal  . Order #: [id number] Class: Historical Med  . Order #: [id number] Class: Normal  . Order #: [id number] Class: Historical Med  . Order #: [id number] Class: Historical Med  . Order #: [id number] Class: Normal  . Order #: [id number] Class: Normal  . Order #: [id number] Class: Historical Med  . Order #: [id number] Class: Normal  . Order #: [id number] Class: Normal  . Order #: [id number] Class: Normal  . Order #: [id number] Class: Normal    Allergies Prednisone  Family History  Problem Relation Age of Onset  . Heart failure Mother   . Colon cancer Father 39       stomach cancer also in 81s  . Throat cancer Brother 1       smoker  . Heart attack Maternal Grandmother   . Colon cancer Paternal Grandmother 10  . Cancer Paternal Grandfather        kidney and bladder  . Ovarian cancer Sister 2       ovarian cancer at 11, colorectal cancer at 41  . Throat cancer Brother 37       throat cancer, smoker  . Breast cancer Paternal Aunt 23  . Ovarian  cancer Other 39       niece with ovarian cancer    Social History Social History   Tobacco Use  . Smoking status: Current Every Day Smoker    Packs/day: 0.00    Years: 37.00    Pack years: 0.00    Types: Cigarettes, E-cigarettes    Last attempt to quit: 03/29/2014    Years since quitting: 4.5  . Smokeless tobacco: Never Used  . Tobacco comment: 5-7 cigs daily 06/07/17  Substance Use Topics  . Alcohol use: No  . Drug use: No    Review of Systems  All other systems negative except as documented in the HPI. All pertinent positives and negatives as reviewed in the HPI. ____________________________________________   PHYSICAL EXAM:  VITAL SIGNS: ED Triage Vitals  Enc Vitals Group     BP 10/24/18 0210  (!) 85/60     Pulse Rate 10/24/18 0210 (!) 104     Resp 10/24/18 0210 (!) 27     Temp 10/24/18 0210 99.5 F (37.5 C)     Temp Source 10/24/18 0210 Oral     SpO2 10/24/18 0148 98 %     Weight 10/24/18 0211 142 lb (64.4 kg)     Height 10/24/18 0211 5\' 6"  (1.676 m)    Constitutional: Alert and oriented. Well appearing and in no acute distress. Eyes: Conjunctivae are normal. PERRL. EOMI. Head: Atraumatic. Nose: No congestion/rhinnorhea. Mouth/Throat: Mucous membranes are dry.  Oropharynx non-erythematous. Neck: No stridor.  No meningeal signs.   Cardiovascular: tachycardic rate, regular rhythm. Good peripheral circulation. Grossly normal heart sounds.   Respiratory: tacypneic respiratory effort.  No retractions. Lungs diminished. Gastrointestinal: Soft and nontender. No distention.  Musculoskeletal: No lower extremity tenderness nor edema. No gross deformities of extremities. Neurologic:  Normal speech and language. No gross focal neurologic deficits are appreciated.  Skin:  Skin is warm, dry and intact. No rash noted.   ____________________________________________   LABS (all labs ordered are listed, but only abnormal results are displayed)  Labs Reviewed  CBC WITH DIFFERENTIAL/PLATELET - Abnormal; Notable for the following components:      Result Value   RBC 3.58 (*)    Hemoglobin 11.3 (*)    HCT 32.4 (*)    Platelets 146 (*)    Lymphs Abs 0.5 (*)    All other components within normal limits  COMPREHENSIVE METABOLIC PANEL - Abnormal; Notable for the following components:   Potassium 3.4 (*)    CO2 19 (*)    Glucose, Bld 105 (*)    Calcium 8.2 (*)    All other components within normal limits  CULTURE, BLOOD (ROUTINE X 2)  CULTURE, BLOOD (ROUTINE X 2)  LACTIC ACID, PLASMA  LACTIC ACID, PLASMA   ____________________________________________  EKG   EKG Interpretation  Date/Time:  Monday October 24 2018 02:55:40 EDT Ventricular Rate:  100 PR Interval:    QRS Duration:  104 QT Interval:  331 QTC Calculation: 427 R Axis:   84 Text Interpretation:  Sinus tachycardia RSR' in V1 or V2, right VCD or RVH No significant change since last tracing Confirmed by Merrily Pew [phone]) on 10/24/2018 3:56:28 AM      ____________________________________________  RADIOLOGY  Dg Chest Portable 1 View  Result Date: 10/24/2018 CLINICAL DATA:  Progressive shortness of breath. Fever. Patient's husband tested positive for COVID 2 days ago. EXAM: PORTABLE CHEST 1 VIEW COMPARISON:  Radiographs 09/28/2014. FINDINGS: Emphysema. Patchy opacities in bilateral lower lung zones, right greater than left. Heart is  normal in size. No evidence of pulmonary edema, large pleural effusion or pneumothorax. The bones are under mineralized. IMPRESSION: 1. Patchy bibasilar opacities, right greater than left, suspicious for pneumonia, including atypical/viral infection. 2. Emphysema. Electronically Signed   By: Keith Rake M.D.   On: 10/24/2018 03:03    ____________________________________________   PROCEDURES  Procedure(s) performed:   Procedures  CRITICAL CARE Performed by: Merrily Pew Total critical care time: 35 minutes Critical care time was exclusive of separately billable procedures and treating other patients. Critical care was necessary to treat or prevent imminent or life-threatening deterioration. Critical care was time spent personally by me on the following activities: development of treatment plan with patient and/or surrogate as well as nursing, discussions with consultants, evaluation of patient's response to treatment, examination of patient, obtaining history from patient or surrogate, ordering and performing treatments and interventions, ordering and review of laboratory studies, ordering and review of radiographic studies, pulse oximetry and re-evaluation of patient's condition.  ____________________________________________   INITIAL IMPRESSION / ASSESSMENT AND PLAN  / ED COURSE  Stacy Fernandez was evaluated in Emergency Department on 10/24/2018 for the symptoms described in the history of present illness. She was evaluated in the context of the global COVID-19 pandemic, which necessitated consideration that the patient might be at risk for infection with the SARS-CoV-2 virus that causes COVID-19. Institutional protocols and algorithms that pertain to the evaluation of patients at risk for COVID-19 are in a state of rapid change based on information released by regulatory bodies including the CDC and federal and state organizations. These policies and algorithms were followed during the patient's care in the ED.   Patient with onset of fever and worsening hypoxia and respiratory failure throughout the day today with exposure to coronavirus 19 from her husband who is currently hospitalized.  Patient not requiring intubation at this time but with her decline, use of oxygen, tachypnea not able speak in full sentences will admit to hospital for management.  She also does have history of COPD and has bibasilar opacities so we will presumptively treat for bacterial infections as well with Rocephin and azithromycin.  Albuterol inhaler did not seem to help much we will continue supportive care in the hospital.     Pertinent labs & imaging results that were available during my care of the patient were reviewed by me and considered in my medical decision making (see chart for details).  ____________________________________________  FINAL CLINICAL IMPRESSION(S) / ED DIAGNOSES  Final diagnoses:  SOB (shortness of breath)  Hypoxia  Community acquired pneumonia, unspecified laterality     MEDICATIONS GIVEN DURING THIS VISIT:  Medications  azithromycin (ZITHROMAX) 500 mg in sodium chloride 0.9 % 250 mL IVPB (500 mg Intravenous New Bag/Given 10/24/18 0321)  albuterol (PROVENTIL HFA;VENTOLIN HFA) 108 (90 Base) MCG/ACT inhaler 2 puff (2 puffs Inhalation Given 10/24/18  0257)  lactated ringers bolus 1,000 mL (1,000 mLs Intravenous New Bag/Given 10/24/18 0347)  cefTRIAXone (ROCEPHIN) 1 g in sodium chloride 0.9 % 100 mL IVPB (0 g Intravenous Stopped 10/24/18 0345)     NEW OUTPATIENT MEDICATIONS STARTED DURING THIS VISIT:  New Prescriptions   No medications on file    Note:  This note was prepared with assistance of Dragon voice recognition software. Occasional wrong-word or sound-a-like substitutions may have occurred due to the inherent limitations of voice recognition software.   Jianni Shelden, Corene Cornea, MD 10/24/18 [phone]

## 2018-10-24 NOTE — H&P (Signed)
History and Physical    Stacy Fernandez ZOX:[id number] DOB: [birth year] DOA: 10/24/2018  PCP: Jettie Booze, NP  Patient coming from: Home  I have personally briefly reviewed patient's old medical records in [place]  Chief Complaint: SOB  HPI: Stacy Fernandez is a 58 y.o. female with medical history significant of COPD.  Patient has been feeling ill for several days.  Looks like she was started on doxycycline back on 4/1.  Her husband is also sick.  Was recently admitted to hospital here, he now has confirmed COVID-19!  Patient presents to the ED with c/o worsening SOB, doesn't use O2 at baseline.  This AM had fever 102.4.  Dyspnea significantly worsening during day today.  She checked her O2 sat at home at it was 86% so EMS called.   ED Course: Tm 99.5.  HR 101. WBC 7.4k.  BP 111/68.  CXR shows BLL infiltrates.  Now with new 2L O2 requirement.   Review of Systems: As per HPI otherwise 10 point review of systems negative.   Past Medical History:  Diagnosis Date  . Anxiety   . 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 Chemotherapy-induced peripheral neuropathy (HCC)    FEET  . 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 CKD (chronic kidney disease), stage III ([place])   . COPD with emphysema [facility]) pulmologist--  dr Elsworth Soho   advanded emphysema per CT   . History of chemotherapy    left breast ca-- 05-09-2015   to 04-30-2015  . History of gastroesophageal reflux (GERD)    no problems since Nissen fundoplication  . Hypothyroidism   . Rectovaginal fistula   . Skin changes related to chemotherapy    ARMS  . Supraclinoid carotid artery aneurysm, small neurologist-  dr willis/  dr Krista Blue   left internal - 2 mm;  aneurysm  vs.  infundibulum--   residual chronic migraine  . Wears dentures    upper  . Wears glasses     Past Surgical History:  Procedure Laterality Date  . APPENDECTOMY  1992  . BREAST RECONSTRUCTION WITH PLACEMENT OF TISSUE EXPANDER AND FLEX HD (ACELLULAR HYDRATED DERMIS) Bilateral 09/25/2014   Procedure: PLACEMENT OF BILATERAL TISSUE EXPANDER AND  ACELLULAR DERMIS FOR BREAST RECONSTRUCTION ;  Surgeon: [name], MD;  Location: [place];  Service: Plastics;  Laterality: Bilateral;  . CHOLECYSTECTOMY  1999  . ESOPHAGOGASTRODUODENOSCOPY (EGD) WITH PROPOFOL  01/18/2013  . LAPAROSCOPIC NISSEN FUNDOPLICATION  [id number]  . LEFT HEART CATHETERIZATION WITH CORONARY ANGIOGRAM N/A 12/26/2012   Procedure: LEFT HEART CATHETERIZATION WITH CORONARY ANGIOGRAM;  Surgeon: Lorretta Harp, MD;  Location: [facility] CATH LAB;  Service: Cardiovascular;  Laterality: N/A;   Nonobstructive CAD/  40-50% ostial D1/  normal LVF, ef 60%  . LIPOSUCTION WITH LIPOFILLING Bilateral 12/18/2014   Procedure: LIPOFILLING TO BILATERAL CHEST;  Surgeon: [name], MD;  Location: [place];  Service: Plastics;  Laterality: Bilateral;  . PORT-A-CATH REMOVAL Right 06/28/2015   Procedure: REMOVAL PORT-A-CATH;  Surgeon: Leighton Ruff, MD;  Location: [facility];  Service: General;  Laterality: Right;  . PORTACATH PLACEMENT N/A 04/02/2014   Procedure: INSERTION PORT-A-CATH;  Surgeon: [name], MD;  Location: [facility];  Service: General;  Laterality: N/A;  . REMOVAL OF BILATERAL TISSUE EXPANDERS WITH PLACEMENT OF BILATERAL BREAST IMPLANTS Bilateral 12/18/2014   Procedure: REMOVAL OF BILATERAL TISSUE EXPANDERS,PLACEMENT SILICONE IMPLANTS ;  Surgeon: [name], MD;  Location: [place];  Service: Plastics;  Laterality: Bilateral;  . SIMPLE MASTECTOMY WITH AXILLARY SENTINEL NODE BIOPSY Bilateral 04/02/2014   Procedure: LEFT TOTAL MASTECTOMY WITH LEFT AXILLARY SENTINEL NODE BIOPSY, RIGHT  PROPHYLACTIC MASTETCTOMY;  Surgeon: [name], MD;  Location: [place];  Service: General;  Laterality: Bilateral;  . TOTAL ABDOMINAL HYSTERECTOMY W/ BILATERAL SALPINGOOPHORECTOMY  1996  . TRANSTHORACIC ECHOCARDIOGRAM  02-04-2015   grade 1 diastolic dysfunction, ef 77-41%/  trivial TR  . VESICO-VAGINAL FISTULA REPAIR N/A 07/17/2015   Procedure: REPAIR OF RECTOVAGINAL FISTULA;  Surgeon: [name], MD;  Location: [facility];  Service: Gynecology;  Laterality: N/A;  . VIDEO BRONCHOSCOPY Bilateral 01/16/2013   Procedure: VIDEO BRONCHOSCOPY WITHOUT FLUORO;  Surgeon: [name], MD;  Location: [facility];  Service: Cardiopulmonary;  Laterality: Bilateral;     reports that she has been smoking cigarettes and e-cigarettes. She has been smoking about 0.00 packs per day for the past 37.00 years. She has never used smokeless tobacco. She reports that she does not drink alcohol or use drugs.  Allergies  Allergen Reactions  . Prednisone Shortness Of Breath and Swelling    CAN TOLERATE IF GIVEN BENADRYL PRIOR     Family History  Problem Relation Age of Onset  . Heart failure Mother   . Colon cancer Father 109       stomach cancer also in 49s  . Throat cancer Brother 18       smoker  . Heart attack Maternal Grandmother   . Colon cancer Paternal Grandmother 8  . Cancer Paternal Grandfather        kidney and bladder  . Ovarian cancer Sister 49       ovarian cancer at 28, colorectal cancer at 67  . Throat cancer Brother 37       throat cancer, smoker  . Breast cancer Paternal Aunt 16  . Ovarian cancer Other 67       niece with ovarian cancer     Prior to Admission medications   Medication Sig Start Date End Date Taking? Authorizing Provider  doxycycline (ADOXA) 100 MG tablet Take 100 mg by mouth 2 (two) times daily. 10 day course started 04/01 10/19/18 10/29/18 Yes [provider]  levothyroxine (SYNTHROID, LEVOTHROID) 125 MCG tablet Take 125 mcg by mouth daily before breakfast.    Yes [provider]  montelukast (SINGULAIR) 10 MG tablet Take 10 mg by mouth at bedtime.  11/09/13  Yes [provider]  PROAIR HFA 108 (90 BASE) MCG/ACT inhaler Inhale 2 puffs into the lungs every 4 (four) hours as needed for wheezing or shortness of breath. Reported on 07/08/2015 01/19/13  Yes [provider]  traMADol (ULTRAM) 50 MG tablet Take 1-2 tablets (50-100 mg total) by mouth every 6 (six) hours as needed. Patient taking differently: Take 50-100 mg by mouth every 6 (six) hours as needed for moderate pain or severe pain.  08/03/18  Yes Magrinat, Virgie Dad, MD  umeclidinium-vilanterol (ANORO ELLIPTA) 62.5-25 MCG/INH AEPB Inhale 1 puff into the lungs daily. 01/07/17  Yes [name], MD    Physical Exam: Vitals:   10/24/18 0307 10/24/18 0343 10/24/18 0400 10/24/18 0441  BP:  90/61 105/69 111/68  Pulse: 98 (!) 103 Marland Kitchen)  102 (!) 101  Resp: (!) 23 (!) 28 (!) 21 (!) 27  Temp:      TempSrc:      SpO2:  96% 100% 98%  Weight:      Height:        Constitutional: NAD, calm, comfortable Eyes: PERRL, lids and conjunctivae normal ENMT: Mucous membranes are moist. Posterior pharynx clear of any exudate or lesions.Normal dentition.  Neck: normal, supple, no masses, no thyromegaly Respiratory: Diminished BS Cardiovascular: Regular rate and rhythm, no murmurs / rubs / gallops. No extremity edema. 2+ pedal pulses. No carotid bruits.  Abdomen: no tenderness, no masses palpated. No hepatosplenomegaly. Bowel sounds positive.  Musculoskeletal: no clubbing / cyanosis. No joint deformity upper and lower extremities. Good ROM, no contractures. Normal muscle tone.  Skin: no rashes, lesions, ulcers. No induration Neurologic: CN 2-12 grossly intact. Sensation intact, DTR normal. Strength 5/5 in all 4.  Psychiatric: Normal judgment and insight. Alert and oriented x 3. Normal mood.    Labs on Admission: I have personally reviewed following labs and imaging studies  CBC: Recent  Labs  Lab 10/24/18 0258  WBC 7.4  NEUTROABS 6.7  HGB 11.3*  HCT 32.4*  MCV 90.5  PLT 010*   Basic Metabolic Panel: Recent Labs  Lab 10/24/18 0258  NA 138  K 3.4*  CL 110  CO2 19*  GLUCOSE 105*  BUN 20  CREATININE 0.72  CALCIUM 8.2*   GFR: Estimated Creatinine Clearance: 72.6 mL/min (by C-G formula based on SCr of 0.72 mg/dL). Liver Function Tests: Recent Labs  Lab 10/24/18 0258  AST 35  ALT 22  ALKPHOS 70  BILITOT 0.5  PROT 6.8  ALBUMIN 3.5   No results for input(s): LIPASE, AMYLASE in the last 168 hours. No results for input(s): AMMONIA in the last 168 hours. Coagulation Profile: No results for input(s): INR, PROTIME in the last 168 hours. Cardiac Enzymes: No results for input(s): CKTOTAL, CKMB, CKMBINDEX, TROPONINI in the last 168 hours. BNP (last 3 results) No results for input(s): PROBNP in the last 8760 hours. HbA1C: No results for input(s): HGBA1C in the last 72 hours. CBG: No results for input(s): GLUCAP in the last 168 hours. Lipid Profile: No results for input(s): CHOL, HDL, LDLCALC, TRIG, CHOLHDL, LDLDIRECT in the last 72 hours. Thyroid Function Tests: No results for input(s): TSH, T4TOTAL, FREET4, T3FREE, THYROIDAB in the last 72 hours. Anemia Panel: No results for input(s): VITAMINB12, FOLATE, FERRITIN, TIBC, IRON, RETICCTPCT in the last 72 hours. Urine analysis:    Component Value Date/Time   COLORURINE YELLOW 02/18/2015 0510   APPEARANCEUR CLOUDY (A) 02/18/2015 0510   LABSPEC 1.018 02/18/2015 0510   LABSPEC 1.010 07/10/2014 1004   PHURINE 5.0 02/18/2015 0510   GLUCOSEU NEGATIVE 02/18/2015 0510   GLUCOSEU Negative 07/10/2014 1004   HGBUR SMALL (A) 02/18/2015 0510   BILIRUBINUR NEGATIVE 02/18/2015 0510   BILIRUBINUR Negative 07/10/2014 1004   KETONESUR NEGATIVE 02/18/2015 0510   PROTEINUR NEGATIVE 02/18/2015 0510   UROBILINOGEN 0.2 02/18/2015 0510   UROBILINOGEN 0.2 07/10/2014 1004   NITRITE NEGATIVE 02/18/2015 0510   LEUKOCYTESUR  MODERATE (A) 02/18/2015 0510   LEUKOCYTESUR Negative 07/10/2014 1004    Radiological Exams on Admission: Dg Chest Portable 1 View  Result Date: 10/24/2018 CLINICAL DATA:  Progressive shortness of breath. Fever. Patient's husband tested positive for COVID 2 days ago. EXAM: PORTABLE CHEST 1 VIEW COMPARISON:  Radiographs 09/28/2014. FINDINGS: Emphysema. Patchy opacities in bilateral lower lung zones, right greater than left. Heart is normal in  size. No evidence of pulmonary edema, large pleural effusion or pneumothorax. The bones are under mineralized. IMPRESSION: 1. Patchy bibasilar opacities, right greater than left, suspicious for pneumonia, including atypical/viral infection. 2. Emphysema. Electronically Signed   By: Keith Rake M.D.   On: 10/24/2018 03:03    EKG: Independently reviewed.  Assessment/Plan Principal Problem:   Suspected Covid-19 Virus Infection Active Problems:   COPD (chronic obstructive pulmonary disease) ([place])    1. Suspected [id number] (almost certainly going to be COVID given that her husband is admitted here at [facility] with confirmed COVID). 1. COVID pathway 2. COVID testing, RVP, and influenza PCR 3. Vit C, Zinc 4. Day team to consider hydroxychloroquine, will hold off for the moment since she just got azithromycin here in the ED 5. O2 via [place] 6. Cont pulse ox 7. Albuterol PRN  DVT prophylaxis: Lovenox Code Status: Full Family Communication: No family in room, husband is admitted to 23W Disposition Plan: Home after admit Consults called: None Admission status: Admit to inpatient  Severity of Illness: The appropriate patient status for this patient is INPATIENT. Inpatient status is judged to be reasonable and necessary in order to provide the required intensity of service to ensure the patient's safety. The patient's presenting symptoms, physical exam findings, and initial radiographic and laboratory data in the context of their chronic comorbidities is felt to  place them at high risk for further clinical deterioration. Furthermore, it is not anticipated that the patient will be medically stable for discharge from the hospital within 2 midnights of admission. The following factors support the patient status of inpatient.   " The patient's presenting symptoms include SOB, fever, COVID exposure. " The worrisome physical exam findings include New O2 requirement. " The initial radiographic and laboratory data are worrisome because of Fever, BLL PNA on CXR. " The chronic co-morbidities include COPD.   * I certify that at the point of admission it is my clinical judgment that the patient will require inpatient hospital care spanning beyond 2 midnights from the point of admission due to high intensity of service, high risk for further deterioration and high frequency of surveillance required.*    Stacy Fernandez M. DO [facility]  How to contact the [facility] Attending or Consulting provider [place] or covering provider during after hours [place], for this patient?  1. Check the care team in [facility] and look for a) attending/consulting TR[facility] provider listed and b) the [facility] team listed 2. Log into [email or web address]  Amion Physician Scheduling and messaging for groups and whole hospitals  On call and physician scheduling software for group practices, residents, hospitalists and other medical providers for call, clinic, rotation and shift schedules. OnCall Enterprise is a hospital-wide system for scheduling doctors and paging doctors on call. EasyPlot is for scientific plotting and data analysis.  [email or web address]  and use [place]'s universal password to access. If you do not have the password, please contact the hospital operator.  3. Locate the [facility] provider you are looking for under [facility] and page to a number that you can be directly reached. 4. If you still have difficulty reaching the provider, please page the [facility] (Director on Call) for the Hospitalists  listed on amion for assistance.  10/24/2018, 5:06 AM

## 2018-10-24 NOTE — ED Notes (Signed)
Pt was taken off nonbreather and placed on 6L nasal cannula with MD at bedside. SpO2 maintained at 99%. Oxygen was then slowly turned lowered until pt was not on oxygen. Pt SpO2 maintained at 96%. MD goal is maintaining oxygen above 95%.

## 2018-10-24 NOTE — ED Notes (Signed)
ED TO INPATIENT HANDOFF REPORT  Name/Age/Gender Stacy Fernandez 58 y.o. female  Code Status    Code Status Orders  (From admission, onward)         Start     Ordered   10/24/18 0410  Full code  Continuous     10/24/18 0414        Code Status History    Date Active Date Inactive Code Status Order ID Comments User Context   07/17/2015 1500 07/18/2015 1351 Full Code [id number]  [name], MD Inpatient   12/18/2014 1700 12/19/2014 1234 Full Code [id number]  [name], MD Inpatient   09/29/2014 0000 10/01/2014 1522 Full Code [id number]  Etta Quill, DO ED   09/25/2014 1047 09/26/2014 1419 Full Code [id number]  [name], MD Inpatient   04/02/2014 1611 04/03/2014 1328 Full Code [id number]  [name], MD Inpatient   12/26/2012 0058 12/27/2012 1553 Full Code [id number]  Rise Patience, MD Inpatient      Home/SNF/Other Home  Chief Complaint Shortness of Breath  Level of Care/Admitting Diagnosis ED Disposition    ED Disposition Condition [facility] Area: [facility] [[id number]]  Level of Care: Med-Surg [16]  Diagnosis: Suspected Covid-19 Virus Infection [[id number]]  Admitting Physician: Etta Quill [[id number]]  Attending Physician: Etta Quill [[id number]]  Estimated length of stay: past midnight tomorrow  Certification:: I certify this patient will need inpatient services for at least 2 midnights  Bed request comments: Low risk COVID, probably has it though.  PT Class (Do Not Modify): Inpatient [101]  PT Acc Code (Do Not Modify): Private [1]       Medical History Past Medical History:  Diagnosis Date  . Anxiety   . 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 Chemotherapy-induced peripheral neuropathy (HCC)    FEET  . 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 CKD (chronic kidney disease), stage III ([place])   . COPD with emphysema [facility]) pulmologist--  dr Elsworth Soho   advanded emphysema per CT   . History of chemotherapy    left breast ca-- 05-09-2015   to 04-30-2015  . History of gastroesophageal reflux (GERD)    no problems since Nissen fundoplication  . Hypothyroidism   . Rectovaginal fistula   . Skin changes related to chemotherapy    ARMS  . Supraclinoid carotid artery aneurysm, small neurologist-  dr willis/  dr Krista Blue   left internal - 2 mm;  aneurysm  vs.  infundibulum--  residual chronic migraine  . Wears dentures    upper  . Wears glasses     Allergies Allergies  Allergen Reactions  . Prednisone Shortness Of Breath and Swelling    CAN TOLERATE IF GIVEN BENADRYL PRIOR     IV Location/Drains/Wounds Patient Lines/Drains/Airways Status   Active Line/Drains/Airways    Name:   Placement date:   Placement time:   Site:   Days:   Implanted Port 04/02/14 Right Chest   04/02/14    1005    Chest   1666   Peripheral IV 04/22/15 Right Antecubital   04/22/15    1716    Antecubital   1281   Peripheral IV 04/25/15 Right  Antecubital   04/25/15    0924    Antecubital   1278   Peripheral IV 10/24/18 Right Antecubital   10/24/18    0318    Antecubital   less than 1   Peripheral IV 10/24/18 Right Hand   10/24/18    0318    Hand   less than 1   Closed System Drain 1 Right Breast Bulb (JP) 15 Fr.   09/25/14    0839    Breast   1490   Closed System Drain 2 Left Breast Bulb (JP) 15 Fr.   09/25/14    0944    Breast   1490   Closed System Drain 1 Right Breast Bulb (JP) 15 Fr.   12/18/14    1407    Breast   1406   Closed System Drain 1 Left Breast Bulb (JP) 15 Fr.   12/18/14    1407    Breast   1406   Incision (Closed) 09/25/14 Breast Left   09/25/14    0948     1490   Incision (Closed) 09/25/14 Breast Right   09/25/14    0948     1490   Incision (Closed) 12/18/14 Breast Right    12/18/14    1406     1406   Incision (Closed) 12/18/14 Breast Left   12/18/14    1406     1406   Incision (Closed) 12/18/14 Abdomen Right   12/18/14    1406     1406   Incision (Closed) 12/18/14 Abdomen Left   12/18/14    1406     1406   Incision (Closed) 06/28/15 Chest Right   06/28/15    1409     1214   Incision (Closed) 06/28/15 Rectum Other (Comment)   06/28/15    1450     1214   Incision (Closed) 07/17/15 Vagina Other (Comment)   07/17/15    1133     1195          Labs/Imaging Results for orders placed or performed during the hospital encounter of 10/24/18 (from the past 48 hour(s))  CBC with Differential     Status: Abnormal   Collection Time: 10/24/18  2:58 AM  Result Value Ref Range   WBC 7.4 4.0 - 10.5 K/uL    Comment: WHITE COUNT CONFIRMED ON SMEAR   RBC 3.58 (L) 3.87 - 5.11 MIL/uL   Hemoglobin 11.3 (L) 12.0 - 15.0 g/dL   HCT 32.4 (L) 36.0 - 46.0 %   MCV 90.5 80.0 - 100.0 fL   MCH 31.6 26.0 - 34.0 pg   MCHC 34.9 30.0 - 36.0 g/dL   RDW 12.5 11.5 - 15.5 %   Platelets 146 (L) 150 - 400 K/uL   nRBC 0.0 0.0 - 0.2 %   Neutrophils Relative % 92 %   Neutro Abs 6.7 1.7 - 7.7 K/uL   Lymphocytes Relative 6 %   Lymphs Abs 0.5 (L) 0.7 - 4.0 K/uL   Monocytes Relative 2 %   Monocytes Absolute 0.2 0.1 - 1.0 K/uL   Eosinophils Relative 0 %   Eosinophils Absolute 0.0 0.0 - 0.5 K/uL   Basophils Relative 0 %   Basophils Absolute 0.0 0.0 - 0.1 K/uL   WBC Morphology MILD LEFT SHIFT (1-5% METAS, OCC MYELO, OCC BANDS)    Immature Granulocytes 0 %   Abs Immature Granulocytes 0.03 0.00 - 0.07 K/uL    Comment: Performed at [facility], [address] [place], [place] [address]  Comprehensive metabolic panel     Status: Abnormal   Collection Time: 10/24/18  2:58 AM  Result Value Ref Range   Sodium 138 135 - 145 mmol/L   Potassium 3.4 (L) 3.5 - 5.1 mmol/L   Chloride 110 98 - 111 mmol/L   CO2 19 (L) 22 - 32 mmol/L   Glucose, Bld 105 (H) 70 - 99 mg/dL   BUN 20 6 - 20  mg/dL   Creatinine, Ser 0.72 0.44 - 1.00 mg/dL   Calcium 8.2 (L) 8.9 - 10.3 mg/dL   Total Protein 6.8 6.5 - 8.1 g/dL   Albumin 3.5 3.5 - 5.0 g/dL   AST 35 15 - 41 U/L   ALT 22 0 - 44 U/L   Alkaline Phosphatase 70 38 - 126 U/L   Total Bilirubin 0.5 0.3 - 1.2 mg/dL   GFR calc non Af Amer >60 >60 mL/min   GFR calc Af Amer >60 >60 mL/min   Anion gap 9 5 - 15    Comment: Performed at [facility], [place] [address], [place], [place] [address]  Lactic acid, plasma     Status: None   Collection Time: 10/24/18  2:58 AM  Result Value Ref Range   Lactic Acid, Venous 0.9 0.5 - 1.9 mmol/L    Comment: Performed at [facility], [place] [address], [place],  [address]   Dg Chest Portable 1 View  Result Date: 10/24/2018 CLINICAL DATA:  Progressive shortness of breath. Fever. Patient's husband tested positive for COVID 2 days ago. EXAM: PORTABLE CHEST 1 VIEW COMPARISON:  Radiographs 09/28/2014. FINDINGS: Emphysema. Patchy opacities in bilateral lower lung zones, right greater than left. Heart is normal in size. No evidence of pulmonary edema, large pleural effusion or pneumothorax. The bones are under mineralized. IMPRESSION: 1. Patchy bibasilar opacities, right greater than left, suspicious for pneumonia, including atypical/viral infection. 2. Emphysema. Electronically Signed   By: Keith Rake M.D.   On: 10/24/2018 03:03    Pending Labs Unresulted Labs (From admission, onward)    Start     Ordered   10/25/18 0500  CBC with Differential/Platelet  Daily,   R     10/24/18 0414   10/25/18 0500  Comprehensive metabolic panel  Daily,   R     10/24/18 0414   10/24/18 0413  D-dimer, quantitative (not at [facility])  Once,   R     10/24/18 0414   10/24/18 0413  Procalcitonin  Once,   R     10/24/18 0414   10/24/18 0413  C-reactive protein  Once,   R     10/24/18 0414   10/24/18 0411  Respiratory Panel by PCR  Add-on,   R     10/24/18 0414   10/24/18 0411  Influenza  panel by PCR (type A & B)  Add-on,   R     10/24/18 0414   10/24/18 0409  Novel Coronavirus, NAA (hospital order; send-out to ref lab)  ([facility] IP COVID ADMIT NOVEL CORONAVIRUS, NAA (HOSPITAL ORDER; SEND-OUT TO REF LAB))  Once,   R    Question Answer Comment  Current symptoms Fever and Shortness of breath   Excluded other viral illnesses Yes   Exposure Risk Contact with a known COVID19 positive person in the last 14 days      10/24/18 0414   10/24/18 0241  Lactic acid, plasma  Now then every 2 hours,   STAT  10/24/18 0240   10/24/18 0233  Blood culture (routine x 2)  BLOOD CULTURE X 2,   STAT     10/24/18 0232          Vitals/Pain Today's Vitals   10/24/18 0213 10/24/18 0307 10/24/18 0343 10/24/18 0400  BP: 104/71  90/61 105/69  Pulse: (!) 104 98 (!) 103 (!) 102  Resp: (!) 22 (!) 23 (!) 28 (!) 21  Temp:      TempSrc:      SpO2: 96%  96% 100%  Weight:      Height:      PainSc:        Isolation Precautions Droplet and Contact precautions  Medications Medications  azithromycin (ZITHROMAX) 500 mg in sodium chloride 0.9 % 250 mL IVPB (500 mg Intravenous New Bag/Given 10/24/18 0321)  enoxaparin (LOVENOX) injection 40 mg (has no administration in time range)  albuterol (PROVENTIL HFA;VENTOLIN HFA) 108 (90 Base) MCG/ACT inhaler 2 puff (has no administration in time range)  vitamin C (ASCORBIC ACID) tablet 500 mg (has no administration in time range)  zinc sulfate capsule 220 mg (has no administration in time range)  guaiFENesin-dextromethorphan (ROBITUSSIN DM) 100-10 MG/5ML syrup 10 mL (has no administration in time range)  chlorpheniramine-HYDROcodone (TUSSIONEX) 10-8 MG/5ML suspension 5 mL (has no administration in time range)  acetaminophen (TYLENOL) tablet 650 mg (has no administration in time range)  ondansetron (ZOFRAN) tablet 4 mg (has no administration in time range)    Or  ondansetron (ZOFRAN) injection 4 mg (has no administration in time range)  albuterol (PROVENTIL  HFA;VENTOLIN HFA) 108 (90 Base) MCG/ACT inhaler 2 puff (2 puffs Inhalation Given 10/24/18 0257)  lactated ringers bolus 1,000 mL (1,000 mLs Intravenous New Bag/Given 10/24/18 0347)  cefTRIAXone (ROCEPHIN) 1 g in sodium chloride 0.9 % 100 mL IVPB (0 g Intravenous Stopped 10/24/18 0345)    Mobility walks

## 2018-10-24 NOTE — Progress Notes (Signed)
Pt is having increase nausea and diarrhea, MD updated orders received. SRP, RN

## 2018-10-24 NOTE — ED Notes (Signed)
Bed: KC12 Expected date:  Expected time:  Means of arrival:  Comments: Possible COVID

## 2018-10-25 LAB — COMPREHENSIVE METABOLIC PANEL
ALT: 16 U/L (ref 0–44)
AST: 26 U/L (ref 15–41)
Albumin: 2.7 g/dL — ABNORMAL LOW (ref 3.5–5.0)
Alkaline Phosphatase: 59 U/L (ref 38–126)
Anion gap: 7 (ref 5–15)
BUN: 11 mg/dL (ref 6–20)
CO2: 19 mmol/L — ABNORMAL LOW (ref 22–32)
Calcium: 7.8 mg/dL — ABNORMAL LOW (ref 8.9–10.3)
Chloride: 113 mmol/L — ABNORMAL HIGH (ref 98–111)
Creatinine, Ser: 0.75 mg/dL (ref 0.44–1.00)
GFR calc Af Amer: >60 mL/min (ref >60)
GFR calc non Af Amer: >60 mL/min (ref >60)
Glucose, Bld: 83 mg/dL (ref 70–99)
Potassium: 3.6 mmol/L (ref 3.5–5.1)
Sodium: 139 mmol/L (ref 135–145)
Total Bilirubin: 0.3 mg/dL (ref 0.3–1.2)
Total Protein: 5.2 g/dL — ABNORMAL LOW (ref 6.5–8.1)

## 2018-10-25 LAB — CBC WITH DIFFERENTIAL/PLATELET
Abs Immature Granulocytes: 0.02 10*3/uL (ref 0.00–0.07)
Basophils Absolute: 0 10*3/uL (ref 0.0–0.1)
Basophils Relative: 0 %
Eosinophils Absolute: 0 10*3/uL (ref 0.0–0.5)
Eosinophils Relative: 1 %
HCT: 27 % — ABNORMAL LOW (ref 36.0–46.0)
Hemoglobin: 9.1 g/dL — ABNORMAL LOW (ref 12.0–15.0)
Immature Granulocytes: 1 %
Lymphocytes Relative: 18 %
Lymphs Abs: 0.8 10*3/uL (ref 0.7–4.0)
MCH: 33.1 pg (ref 26.0–34.0)
MCHC: 33.7 g/dL (ref 30.0–36.0)
MCV: 98.2 fL (ref 80.0–100.0)
Monocytes Absolute: 0.2 10*3/uL (ref 0.1–1.0)
Monocytes Relative: 4 %
Neutro Abs: 3.2 10*3/uL (ref 1.7–7.7)
Neutrophils Relative %: 76 %
Platelets: 158 10*3/uL (ref 150–400)
RBC: 2.75 MIL/uL — ABNORMAL LOW (ref 3.87–5.11)
RDW: 13.2 % (ref 11.5–15.5)
WBC Morphology: INCREASED
WBC: 4.2 10*3/uL (ref 4.0–10.5)
nRBC: 0 % (ref 0.0–0.2)

## 2018-10-25 LAB — MAGNESIUM: Magnesium: 2 mg/dL (ref 1.7–2.4)

## 2018-10-25 MED ORDER — BOOST / RESOURCE BREEZE PO LIQD CUSTOM
1.0000 | ORAL | Status: DC
  Administered 2018-10-25 – 2018-10-28 (×3): 1 via ORAL

## 2018-10-25 MED ORDER — POTASSIUM CHLORIDE CRYS ER 20 MEQ PO TBCR
40.0000 meq | EXTENDED_RELEASE_TABLET | Freq: Once | ORAL | Status: AC
  Administered 2018-10-25: 40 meq via ORAL
  Filled 2018-10-25: qty 2

## 2018-10-25 MED ORDER — ADULT MULTIVITAMIN W/MINERALS CH
1.0000 | ORAL_TABLET | Freq: Every day | ORAL | Status: DC
  Administered 2018-10-25 – 2018-10-28 (×4): 1 via ORAL
  Filled 2018-10-25 (×5): qty 1

## 2018-10-25 NOTE — Progress Notes (Signed)
MEWS score increased, due to pulse and respiration, MD updated. No new orders. Will cont to monitor patient. SRP, RN

## 2018-10-25 NOTE — Progress Notes (Addendum)
PROGRESS NOTE    Stacy Fernandez  QAS:[id number] DOB: January 16, 1961 DOA: 10/24/2018 PCP: Jettie Booze, NP   Brief Narrative: 58 y.o. female with medical history significant of COPD.  Patient has been feeling ill for several days.  Looks like she was started on doxycycline back on 4/1.  Her husband is also sick.  Was recently admitted to hospital here, he now has confirmed COVID-19!  Patient presents to the ED with c/o worsening SOB, doesn't use O2 at baseline.  This AM had fever 102.4.  Dyspnea significantly worsening during day today.  She checked her O2 sat at home at it was 86% so EMS called.   ED Course: Tm 99.5.  HR 101. WBC 7.4k.  BP 111/68.  CXR shows BLL infiltrates.  Now with new 2L O2 requirement.   Assessment & Plan:   Principal Problem:   Suspected Covid-19 Virus Infection Active Problems:   COPD (chronic obstructive pulmonary disease) ([place])  #1 acute hypoxic respiratory failure secondary to COVID-19 most likely cause.  Her chest x-ray showed patchy bibasilar opacities right greater than left suspicious for pneumonia including atypical and viral infection and emphysema.  She received doxycycline as an outpatient.  Patient's husband is COVID-19 positive but admitted to [facility].  Her COVID-19 test is still pending.  Continue MDI, Tessalon Perles, Robitussin vitamin C and zinc and chloroquine.  She is on 2 L of oxygen saturation above 92%.  Her main complaints are nausea and diarrhea patient has been receiving Phenergan and Zofran.  Continue Claritin and Singulair.  Continue IV fluids.  EKG pending for today EKG from yesterday shows normal QT. Encourage her to do proning  as much as possible.  #2 hypothyroidism continue Synthroid  #3 hypokalemia replete potassium and keep it above 4 mag level is normal.  DVT prophylaxis: Lovenox Code Status: Full Family Communication: Discussed with husband who is also my patient.   Disposition Plan: Home after admit  Consults called: None  Estimated body mass index is 22.92 kg/m as calculated from the following:   Height as of this encounter: 5\' 6"  (1.676 m).   Weight as of this encounter: 64.4 kg.   Subjective:  Better with Phenergan and Zofran has not had any vomiting overnight but has had nausea and diarrhea. Objective: Vitals:   10/24/18 1352 10/24/18 2100 10/24/18 2148 10/25/18 0616  BP: 91/64  (!) 91/59 96/64  Pulse: 89 94 95 92  Resp: 20 20 18 18   Temp: 98.4 F (36.9 C)  98.6 F (37 C) 99.1 F (37.3 C)  TempSrc: Oral  Oral Oral  SpO2: 98%  98% 97%  Weight:      Height:        Intake/Output Summary (Last 24 hours) at 10/25/2018 0858 Last data filed at 10/25/2018 0600 Gross per 24 hour  Intake 2652.32 ml  Output -  Net 2652.32 ml   Filed Weights   10/24/18 0211  Weight: 64.4 kg    Examination:  General exam: Appears calm and comfortable  Respiratory system: Few scattered rhonchi in both lung fields to auscultation. Respiratory effort normal. Cardiovascular system: S1 & S2 heard, RRR. No JVD, murmurs, rubs, gallops or clicks. No pedal edema. Gastrointestinal system: Abdomen is nondistended, soft and nontender. No organomegaly or masses felt. Normal bowel sounds heard. Central nervous system: Alert and oriented. No focal neurological deficits. Extremities: Symmetric 5 x 5 power. Skin: No rashes, lesions or ulcers Psychiatry: Judgement and insight appear normal. Mood & affect appropriate.  Data Reviewed: I have personally reviewed following labs and imaging studies  CBC: Recent Labs  Lab 10/24/18 0258 10/25/18 0341  WBC 7.4 4.2  NEUTROABS 6.7 3.2  HGB 11.3* 9.1*  HCT 32.4* 27.0*  MCV 90.5 98.2  PLT 146* 517   Basic Metabolic Panel: Recent Labs  Lab 10/24/18 0258 10/25/18 0341  NA 138 139  K 3.4* 3.6  CL 110 113*  CO2 19* 19*  GLUCOSE 105* 83  BUN 20 11  CREATININE 0.72 0.75  CALCIUM 8.2* 7.8*  MG  --  2.0   GFR: Estimated Creatinine Clearance:  72.6 mL/min (by C-G formula based on SCr of 0.75 mg/dL). Liver Function Tests: Recent Labs  Lab 10/24/18 0258 10/25/18 0341  AST 35 26  ALT 22 16  ALKPHOS 70 59  BILITOT 0.5 0.3  PROT 6.8 5.2*  ALBUMIN 3.5 2.7*   No results for input(s): LIPASE, AMYLASE in the last 168 hours. No results for input(s): AMMONIA in the last 168 hours. Coagulation Profile: No results for input(s): INR, PROTIME in the last 168 hours. Cardiac Enzymes: No results for input(s): CKTOTAL, CKMB, CKMBINDEX, TROPONINI in the last 168 hours. BNP (last 3 results) No results for input(s): PROBNP in the last 8760 hours. HbA1C: No results for input(s): HGBA1C in the last 72 hours. CBG: No results for input(s): GLUCAP in the last 168 hours. Lipid Profile: No results for input(s): CHOL, HDL, LDLCALC, TRIG, CHOLHDL, LDLDIRECT in the last 72 hours. Thyroid Function Tests: No results for input(s): TSH, T4TOTAL, FREET4, T3FREE, THYROIDAB in the last 72 hours. Anemia Panel: No results for input(s): VITAMINB12, FOLATE, FERRITIN, TIBC, IRON, RETICCTPCT in the last 72 hours. Sepsis Labs: Recent Labs  Lab 10/24/18 0258 10/24/18 0645  PROCALCITON 0.65  --   LATICACIDVEN 0.9 0.9    Recent Results (from the past 240 hour(s))  Blood culture (routine x 2)     Status: None (Preliminary result)   Collection Time: 10/24/18  2:59 AM  Result Value Ref Range Status   Specimen Description   Final    BLOOD RIGHT HAND Performed at [facility] [address], [place], [place] [address]    Special Requests   Final    BOTTLES DRAWN AEROBIC AND ANAEROBIC Blood Culture results may not be optimal due to an excessive volume of blood received in culture bottles Performed at [facility] [address] [place], [place] [address]    Culture   Final    NO GROWTH 1 DAY Performed at [facility], [place] [address], [place], [place] [address]    Report Status PENDING  Incomplete  Blood  culture (routine x 2)     Status: None (Preliminary result)   Collection Time: 10/24/18  2:59 AM  Result Value Ref Range Status   Specimen Description   Final    BLOOD RIGHT FOREARM Performed at [facility] [address] [place], [place] [address]    Special Requests   Final    BOTTLES DRAWN AEROBIC AND ANAEROBIC Blood Culture results may not be optimal due to an excessive volume of blood received in culture bottles Performed at [facility] [address], [place], [place] [address]    Culture   Final    NO GROWTH 1 DAY Performed at [facility], [place] [address] [place],  [address]    Report Status PENDING  Incomplete  Respiratory Panel by PCR     Status:  None   Collection Time: 10/24/18  5:59 AM  Result Value Ref Range Status   Adenovirus NOT DETECTED NOT DETECTED Final   Coronavirus 229E NOT DETECTED NOT DETECTED Final    Comment: (NOTE) The Coronavirus on the Respiratory Panel, DOES NOT test for the novel  Coronavirus (2019 nCoV)    Coronavirus HKU1 NOT DETECTED NOT DETECTED Final   Coronavirus NL63 NOT DETECTED NOT DETECTED Final   Coronavirus OC43 NOT DETECTED NOT DETECTED Final   Metapneumovirus NOT DETECTED NOT DETECTED Final   Rhinovirus / Enterovirus NOT DETECTED NOT DETECTED Final   Influenza A NOT DETECTED NOT DETECTED Final   Influenza B NOT DETECTED NOT DETECTED Final   Parainfluenza Virus 1 NOT DETECTED NOT DETECTED Final   Parainfluenza Virus 2 NOT DETECTED NOT DETECTED Final   Parainfluenza Virus 3 NOT DETECTED NOT DETECTED Final   Parainfluenza Virus 4 NOT DETECTED NOT DETECTED Final   Respiratory Syncytial Virus NOT DETECTED NOT DETECTED Final   Bordetella pertussis NOT DETECTED NOT DETECTED Final   Chlamydophila pneumoniae NOT DETECTED NOT DETECTED Final   Mycoplasma pneumoniae NOT DETECTED NOT DETECTED Final    Comment: Performed at [facility], [place] [address] [place], [place] [address]          Radiology Studies: Dg Chest Portable 1 View  Result Date: 10/24/2018 CLINICAL DATA:  Progressive shortness of breath. Fever. Patient's husband tested positive for COVID 2 days ago. EXAM: PORTABLE CHEST 1 VIEW COMPARISON:  Radiographs 09/28/2014. FINDINGS: Emphysema. Patchy opacities in bilateral lower lung zones, right greater than left. Heart is normal in size. No evidence of pulmonary edema, large pleural effusion or pneumothorax. The bones are under mineralized. IMPRESSION: 1. Patchy bibasilar opacities, right greater than left, suspicious for pneumonia, including atypical/viral infection. 2. Emphysema. Electronically Signed   By: Keith Rake M.D.   On: 10/24/2018 03:03        Scheduled Meds: . albuterol  2 puff Inhalation Q6H  . enoxaparin (LOVENOX) injection  40 mg Subcutaneous Daily  . feeding supplement (ENSURE ENLIVE)  237 mL Oral BID BM  . hydroxychloroquine  200 mg Oral BID  . levothyroxine  125 mcg Oral QAC breakfast  . loratadine  10 mg Oral Daily  . montelukast  10 mg Oral QHS  . umeclidinium-vilanterol  1 puff Inhalation Daily  . vitamin C  500 mg Oral Daily  . zinc sulfate  220 mg Oral Daily   Continuous Infusions: . sodium chloride 100 mL/hr at 10/25/18 0145     LOS: 1 day     Georgette Shell, MD [facility]  If 7PM-7AM, please contact night-coverage [email or web address] Password [facility] 10/25/2018, 8:58 AM

## 2018-10-25 NOTE — Progress Notes (Signed)
Nausea improved form this am, will continue to monitor. SRP, RN

## 2018-10-25 NOTE — Progress Notes (Signed)
Pt states feel some better, has remain afebrile during the shift. Pt resting throughout the day, has laid prone part of the day. Pt tolerating liquids, diarrhea has decreased. Will continue to monitor. SRP, RN

## 2018-10-25 NOTE — Plan of Care (Signed)
Plan of care reviewed and discussed with the patient. 

## 2018-10-25 NOTE — Progress Notes (Signed)
Initial Nutrition Assessment  INTERVENTION:   -Continue Ensure Enlive po BID, each supplement provides 350 kcal and 20 grams of protein -Provide Boost Breeze po daily, each supplement provides 250 kcal and 9 grams of protein -Provide Multivitamin with minerals daily  NUTRITION DIAGNOSIS:   Increased nutrient needs related to (COPD) as evidenced by estimated needs.  GOAL:   Patient will meet greater than or equal to 90% of their needs  MONITOR:   PO intake, Supplement acceptance, Labs, Weight trends, I & O's  REASON FOR ASSESSMENT:   Malnutrition Screening Tool    ASSESSMENT:   58 y.o. female with medical history significant of COPD. Admitted for acute hypoxic respiratory failure secondary to likely COVID-19.  **RD working remotely**  Patient is being tested for COVID-19, results pending.  Pt currently struggling with nausea and diarrhea. Pt was unable to eat much yesterday d/t these symptoms (0-10%). Pt did not drink any Ensure d/t nausea. Will add additional supplement [employer] daily).   Per weight records, pt has lost 18 lb since October 2019 (11% wt loss x 5.5 months, significant for time frame). Suspect pt has some degree of malnutrition given poor PO intakes, acute and chronic illness and significant weight loss.   Labs reviewed. Medications:  K-DUR tablet once, Vitamin C daily, Zinc sulfate capsule daily  NUTRITION - FOCUSED PHYSICAL EXAM:  Unable to perform per department requirements to work remotely.  Diet Order:   Diet Order            Diet regular Room service appropriate? Yes; Fluid consistency: Thin  Diet effective now              EDUCATION NEEDS:   No education needs have been identified at this time  Skin:  Skin Assessment: Reviewed RN Assessment  Last BM:  4/6  Height:   Ht Readings from Last 1 Encounters:  10/24/18 5\' 6"  (1.676 m)    Weight:   Wt Readings from Last 1 Encounters:  10/24/18 64.4 kg    Ideal Body Weight:   59.1 kg  BMI:  Body mass index is 22.92 kg/m.  Estimated Nutritional Needs:   Kcal:  1900-2100  Protein:  95-105g  Fluid:  2L/day  Clayton Bibles, MS, RD, LDN [facility] Pager: [phone] After Hours Pager: [phone]

## 2018-10-26 LAB — COMPREHENSIVE METABOLIC PANEL
ALT: 17 U/L (ref 0–44)
AST: 24 U/L (ref 15–41)
Albumin: 2.7 g/dL — ABNORMAL LOW (ref 3.5–5.0)
Alkaline Phosphatase: 61 U/L (ref 38–126)
Anion gap: 4 — ABNORMAL LOW (ref 5–15)
BUN: 8 mg/dL (ref 6–20)
CO2: 20 mmol/L — ABNORMAL LOW (ref 22–32)
Calcium: 7.8 mg/dL — ABNORMAL LOW (ref 8.9–10.3)
Chloride: 117 mmol/L — ABNORMAL HIGH (ref 98–111)
Creatinine, Ser: 0.62 mg/dL (ref 0.44–1.00)
GFR calc Af Amer: >60 mL/min (ref >60)
GFR calc non Af Amer: >60 mL/min (ref >60)
Glucose, Bld: 85 mg/dL (ref 70–99)
Potassium: 3.6 mmol/L (ref 3.5–5.1)
Sodium: 141 mmol/L (ref 135–145)
Total Bilirubin: 0.5 mg/dL (ref 0.3–1.2)
Total Protein: 5.3 g/dL — ABNORMAL LOW (ref 6.5–8.1)

## 2018-10-26 LAB — CBC WITH DIFFERENTIAL/PLATELET
Abs Immature Granulocytes: 0 10*3/uL (ref 0.00–0.07)
Basophils Absolute: 0 10*3/uL (ref 0.0–0.1)
Basophils Relative: 1 %
Eosinophils Absolute: 0 10*3/uL (ref 0.0–0.5)
Eosinophils Relative: 0 %
HCT: 26.2 % — ABNORMAL LOW (ref 36.0–46.0)
Hemoglobin: 8.5 g/dL — ABNORMAL LOW (ref 12.0–15.0)
Lymphocytes Relative: 16 %
Lymphs Abs: 0.5 10*3/uL — ABNORMAL LOW (ref 0.7–4.0)
MCH: 31.8 pg (ref 26.0–34.0)
MCHC: 32.4 g/dL (ref 30.0–36.0)
MCV: 98.1 fL (ref 80.0–100.0)
Monocytes Absolute: 0.2 10*3/uL (ref 0.1–1.0)
Monocytes Relative: 6 %
Neutro Abs: 2.6 10*3/uL (ref 1.7–7.7)
Neutrophils Relative %: 77 %
Platelets: 169 10*3/uL (ref 150–400)
RBC: 2.67 MIL/uL — ABNORMAL LOW (ref 3.87–5.11)
RDW: 13 % (ref 11.5–15.5)
WBC: 3.4 10*3/uL — ABNORMAL LOW (ref 4.0–10.5)
nRBC: 0 % (ref 0.0–0.2)

## 2018-10-26 LAB — NOVEL CORONAVIRUS, NAA (HOSP ORDER, SEND-OUT TO REF LAB; TAT 18-24 HRS): SARS-CoV-2, NAA: DETECTED — AB

## 2018-10-26 MED ORDER — TRAMADOL HCL 50 MG PO TABS
50.0000 mg | ORAL_TABLET | Freq: Four times a day (QID) | ORAL | Status: DC | PRN
  Administered 2018-10-27: 100 mg via ORAL
  Filled 2018-10-26 (×2): qty 2

## 2018-10-26 MED ORDER — POTASSIUM CHLORIDE CRYS ER 20 MEQ PO TBCR
60.0000 meq | EXTENDED_RELEASE_TABLET | Freq: Once | ORAL | Status: AC
  Administered 2018-10-26: 60 meq via ORAL
  Filled 2018-10-26: qty 3

## 2018-10-26 MED ORDER — HYDROCODONE-ACETAMINOPHEN 5-325 MG PO TABS
1.0000 | ORAL_TABLET | Freq: Four times a day (QID) | ORAL | Status: DC | PRN
  Administered 2018-10-26 – 2018-10-27 (×2): 1 via ORAL
  Filled 2018-10-26 (×2): qty 1

## 2018-10-26 MED ORDER — HYDROCOD POLST-CPM POLST ER 10-8 MG/5ML PO SUER
5.0000 mL | Freq: Three times a day (TID) | ORAL | Status: DC
  Administered 2018-10-26 – 2018-10-29 (×9): 5 mL via ORAL
  Filled 2018-10-26 (×10): qty 5

## 2018-10-26 NOTE — Progress Notes (Signed)
PROGRESS NOTE    Stacy Fernandez  OEV:[id number] DOB: 09-16-1960 DOA: 10/24/2018 PCP: Jettie Booze, NP Brief Narrative58 y.o.femalewith medical history significant ofCOPD. Patient has been feeling ill for several days. Looks like she was started on doxycycline back on 4/1. Her husband is also sick. Was recently admitted to hospital here, he now has confirmed COVID-19!  Patient presents to the ED with c/o worsening SOB, doesn't use O2at baseline. This AM had fever 102.4. Dyspnea significantly worsening during day today. She checked her O2 sat at home at it was 86% so EMS called.   ED Course:Tm 99.5. HR 101. WBC 7.4k. BP 111/68. CXR shows BLL infiltrates. Now with new 2L O2 requirement. Assessment & Plan:   Principal Problem:   Suspected Covid-19 Virus Infection Active Problems:   COPD (chronic obstructive pulmonary disease) ([place])  #1 acute hypoxic respiratory failure secondary to COVID-19 most likely cause.  Her chest x-ray showed patchy bibasilar opacities right greater than left suspicious for pneumonia including atypical and viral infection and emphysema.  She received doxycycline as an outpatient.  Patient's husband is COVID-19 positive but [facility] to [facility]  Her COVID-19 test is still pending.  Continue MDI, Tessalon Perles, Robitussin vitamin C and zinc and chloroquine.  She is on 2 L of oxygen saturation above 92%.  Her main complaints are nausea and diarrhea patient has been receiving Phenergan and Zofran.  Continue Claritin and Singulair. Dc ivf.  EKG qt normal  #2 hypothyroidism continue Synthroid  #3 hypokalemia repleted   DVT prophylaxis:Lovenox Code Status:Full Family Communication: Discussed with husband who is also my patient.   Disposition Plan:Home after admit Consults called:None       Nutrition Problem: Increased nutrient needs Etiology: (COPD)     Signs/Symptoms: estimated needs    Interventions: Ensure  Enlive (each supplement provides 350kcal and 20 grams of protein)  Estimated body mass index is 22.92 kg/m as calculated from the following:   Height as of this encounter: 5\' 6"  (1.676 m).   Weight as of this encounter: 64.4 kg.  Subjective: Nausea better still coughing  Objective: Vitals:   10/25/18 1131 10/25/18 1306 10/25/18 2030 10/26/18 0444  BP: (!) 82/55 (!) 97/56 (!) 83/59 (!) 91/57  Pulse: 89 89 90 80  Resp: (!) 21 (!) 21 20 18   Temp:  98 F (36.7 C) 98.4 F (36.9 C) 99.4 F (37.4 C)  TempSrc:    Oral  SpO2: 97% 96% 96% 98%  Weight:      Height:        Intake/Output Summary (Last 24 hours) at 10/26/2018 0918 Last data filed at 10/26/2018 0500 Gross per 24 hour  Intake 2332.72 ml  Output 440 ml  Net 1892.72 ml   Filed Weights   10/24/18 0211  Weight: 64.4 kg    Examination:  General exam: Appears calm and comfortable  Respiratory system: scattered rhonchi to auscultation. Respiratory effort normal. Cardiovascular system: S1 & S2 heard, RRR. No JVD, murmurs, rubs, gallops or clicks. No pedal edema. Gastrointestinal system: Abdomen is nondistended, soft and nontender. No organomegaly or masses felt. Normal bowel sounds heard. Central nervous system: Alert and oriented. No focal neurological deficits. Extremities: Symmetric 5 x 5 power. Skin: No rashes, lesions or ulcers Psychiatry: Judgement and insight appear normal. Mood & affect appropriate.     Data Reviewed: I have personally reviewed following labs and imaging studies  CBC: Recent Labs  Lab 10/24/18 0258 10/25/18 0341 10/26/18 0442  WBC 7.4 4.2 3.4*  NEUTROABS 6.7 3.2 2.6  HGB 11.3* 9.1* 8.5*  HCT 32.4* 27.0* 26.2*  MCV 90.5 98.2 98.1  PLT 146* 158 387   Basic Metabolic Panel: Recent Labs  Lab 10/24/18 0258 10/25/18 0341 10/26/18 0442  NA 138 139 141  K 3.4* 3.6 3.6  CL 110 113* 117*  CO2 19* 19* 20*  GLUCOSE 105* 83 85  BUN 20 11 8   CREATININE 0.72 0.75 0.62  CALCIUM 8.2* 7.8*  7.8*  MG  --  2.0  --    GFR: Estimated Creatinine Clearance: 72.6 mL/min (by C-G formula based on SCr of 0.62 mg/dL). Liver Function Tests: Recent Labs  Lab 10/24/18 0258 10/25/18 0341 10/26/18 0442  AST 35 26 24  ALT 22 16 17   ALKPHOS 70 59 61  BILITOT 0.5 0.3 0.5  PROT 6.8 5.2* 5.3*  ALBUMIN 3.5 2.7* 2.7*   No results for input(s): LIPASE, AMYLASE in the last 168 hours. No results for input(s): AMMONIA in the last 168 hours. Coagulation Profile: No results for input(s): INR, PROTIME in the last 168 hours. Cardiac Enzymes: No results for input(s): CKTOTAL, CKMB, CKMBINDEX, TROPONINI in the last 168 hours. BNP (last 3 results) No results for input(s): PROBNP in the last 8760 hours. HbA1C: No results for input(s): HGBA1C in the last 72 hours. CBG: No results for input(s): GLUCAP in the last 168 hours. Lipid Profile: No results for input(s): CHOL, HDL, LDLCALC, TRIG, CHOLHDL, LDLDIRECT in the last 72 hours. Thyroid Function Tests: No results for input(s): TSH, T4TOTAL, FREET4, T3FREE, THYROIDAB in the last 72 hours. Anemia Panel: No results for input(s): VITAMINB12, FOLATE, FERRITIN, TIBC, IRON, RETICCTPCT in the last 72 hours. Sepsis Labs: Recent Labs  Lab 10/24/18 0258 10/24/18 0645  PROCALCITON 0.65  --   LATICACIDVEN 0.9 0.9    Recent Results (from the past 240 hour(s))  Blood culture (routine x 2)     Status: None (Preliminary result)   Collection Time: 10/24/18  2:59 AM  Result Value Ref Range Status   Specimen Description   Final    BLOOD RIGHT HAND Performed at [facility] [address], [place], [place] [address]    Special Requests   Final    BOTTLES DRAWN AEROBIC AND ANAEROBIC Blood Culture results may not be optimal due to an excessive volume of blood received in culture bottles Performed at [facility] [address] [place], [place] [address]    Culture   Final    NO GROWTH 2 DAYS Performed at [facility] [address], [place], [place] [address]    Report Status PENDING  Incomplete  Blood culture (routine x 2)     Status: None (Preliminary result)   Collection Time: 10/24/18  2:59 AM  Result Value Ref Range Status   Specimen Description   Final    BLOOD RIGHT FOREARM Performed at [facility] [address], [place], [place] [address]    Special Requests   Final    BOTTLES DRAWN AEROBIC AND ANAEROBIC Blood Culture results may not be optimal due to an excessive volume of blood received in culture bottles Performed at [facility] [address] [place], [place] [address]    Culture   Final    NO GROWTH 2 DAYS Performed at [facility] [address], [place], [place] [address]    Report Status PENDING  Incomplete  Respiratory Panel by PCR     Status:  None   Collection Time: 10/24/18  5:59 AM  Result Value Ref Range Status   Adenovirus NOT DETECTED NOT DETECTED Final   Coronavirus 229E NOT DETECTED NOT DETECTED Final    Comment: (NOTE) The Coronavirus on the Respiratory Panel, DOES NOT test for the novel  Coronavirus (2019 nCoV)    Coronavirus HKU1 NOT DETECTED NOT DETECTED Final   Coronavirus NL63 NOT DETECTED NOT DETECTED Final   Coronavirus OC43 NOT DETECTED NOT DETECTED Final   Metapneumovirus NOT DETECTED NOT DETECTED Final   Rhinovirus / Enterovirus NOT DETECTED NOT DETECTED Final   Influenza A NOT DETECTED NOT DETECTED Final   Influenza B NOT DETECTED NOT DETECTED Final   Parainfluenza Virus 1 NOT DETECTED NOT DETECTED Final   Parainfluenza Virus 2 NOT DETECTED NOT DETECTED Final   Parainfluenza Virus 3 NOT DETECTED NOT DETECTED Final   Parainfluenza Virus 4 NOT DETECTED NOT DETECTED Final   Respiratory Syncytial Virus NOT DETECTED NOT DETECTED Final   Bordetella pertussis NOT DETECTED NOT DETECTED Final   Chlamydophila pneumoniae NOT DETECTED NOT DETECTED Final   Mycoplasma pneumoniae NOT DETECTED NOT  DETECTED Final    Comment: Performed at [facility], [place] [address] [place], [place] [address]         Radiology Studies: No results found.      Scheduled Meds: . albuterol  2 puff Inhalation Q6H  . enoxaparin (LOVENOX) injection  40 mg Subcutaneous Daily  . feeding supplement  1 Container Oral Q24H  . feeding supplement (ENSURE ENLIVE)  237 mL Oral BID BM  . hydroxychloroquine  200 mg Oral BID  . levothyroxine  125 mcg Oral QAC breakfast  . loratadine  10 mg Oral Daily  . montelukast  10 mg Oral QHS  . multivitamin with minerals  1 tablet Oral Daily  . umeclidinium-vilanterol  1 puff Inhalation Daily  . vitamin C  500 mg Oral Daily  . zinc sulfate  220 mg Oral Daily   Continuous Infusions: . sodium chloride 100 mL/hr at 10/26/18 0200     LOS: 2 days     Georgette Shell, MD [facility]  If 7PM-7AM, please contact night-coverage [email or web address] Password [facility] 10/26/2018, 9:18 AM

## 2018-10-27 LAB — MAGNESIUM: Magnesium: 2.1 mg/dL (ref 1.7–2.4)

## 2018-10-27 LAB — CBC WITH DIFFERENTIAL/PLATELET
Abs Immature Granulocytes: 0.01 10*3/uL (ref 0.00–0.07)
Basophils Absolute: 0 10*3/uL (ref 0.0–0.1)
Basophils Relative: 0 %
Eosinophils Absolute: 0 10*3/uL (ref 0.0–0.5)
Eosinophils Relative: 1 %
HCT: 26.8 % — ABNORMAL LOW (ref 36.0–46.0)
Hemoglobin: 9 g/dL — ABNORMAL LOW (ref 12.0–15.0)
Immature Granulocytes: 0 %
Lymphocytes Relative: 29 %
Lymphs Abs: 0.9 10*3/uL (ref 0.7–4.0)
MCH: 32.7 pg (ref 26.0–34.0)
MCHC: 33.6 g/dL (ref 30.0–36.0)
MCV: 97.5 fL (ref 80.0–100.0)
Monocytes Absolute: 0.3 10*3/uL (ref 0.1–1.0)
Monocytes Relative: 9 %
Neutro Abs: 1.8 10*3/uL (ref 1.7–7.7)
Neutrophils Relative %: 61 %
Platelets: 193 10*3/uL (ref 150–400)
RBC: 2.75 MIL/uL — ABNORMAL LOW (ref 3.87–5.11)
RDW: 12.7 % (ref 11.5–15.5)
WBC: 3.1 10*3/uL — ABNORMAL LOW (ref 4.0–10.5)
nRBC: 0 % (ref 0.0–0.2)

## 2018-10-27 LAB — COMPREHENSIVE METABOLIC PANEL
ALT: 18 U/L (ref 0–44)
AST: 22 U/L (ref 15–41)
Albumin: 2.6 g/dL — ABNORMAL LOW (ref 3.5–5.0)
Alkaline Phosphatase: 66 U/L (ref 38–126)
Anion gap: 6 (ref 5–15)
BUN: 6 mg/dL (ref 6–20)
CO2: 24 mmol/L (ref 22–32)
Calcium: 8.3 mg/dL — ABNORMAL LOW (ref 8.9–10.3)
Chloride: 108 mmol/L (ref 98–111)
Creatinine, Ser: 0.6 mg/dL (ref 0.44–1.00)
GFR calc Af Amer: >60 mL/min (ref >60)
GFR calc non Af Amer: >60 mL/min (ref >60)
Glucose, Bld: 87 mg/dL (ref 70–99)
Potassium: 4.6 mmol/L (ref 3.5–5.1)
Sodium: 138 mmol/L (ref 135–145)
Total Bilirubin: 0.3 mg/dL (ref 0.3–1.2)
Total Protein: 5.7 g/dL — ABNORMAL LOW (ref 6.5–8.1)

## 2018-10-27 NOTE — Progress Notes (Signed)
PROGRESS NOTE    Stacy Fernandez  SFK:[id number] DOB: [birth year] DOA: 10/24/2018 PCP: Jettie Booze, NP   Brief Narrative: 58 y.o.femalewith medical history significant ofCOPD. Patient has been feeling ill for several days. Looks like she was started on doxycycline back on 4/1. Her husband is also sick. Was recently admitted to hospital here, he now has confirmed COVID-19!  Patient presents to the ED with c/o worsening SOB, doesn't use O2at baseline. This AM had fever 102.4. Dyspnea significantly worsening during day today. She checked her O2 sat at home at it was 86% so EMS called.   ED Course:Tm 99.5. HR 101. WBC 7.4k. BP 111/68. CXR shows BLL infiltrates. Now with new 2L O2 requirement  Assessment & Plan:   Principal Problem:   Suspected Covid-19 Virus Infection Active Problems:   COPD (chronic obstructive pulmonary disease) ([place])   #1 acute hypoxic respiratory failure secondary to COVID-19 . Her chest x-ray showed patchy bibasilar opacities right greater than left suspicious for pneumonia including atypical and viral infection and emphysema. She received doxycycline as an outpatient. Patient's husband is COVID-19 positive but admitted to [facility] hospital. Continue MDI, Tessalon Perles, Robitussin vitamin C and zinc and chloroquine. She is on 2 L of oxygen saturation above 92%.Her main complaints are nausea and diarrhea patient has been receiving Phenergan and Zofran. Continue Claritin and Singulair. Dc ivf. EKG qt normal.. Patient is still on 2 L of oxygen saturation 100% Taper oxygen down to room air and monitor closely if she remains above 92% we will plan on discharging her home soon.  #2 hypothyroidism continue Synthroid  #3 hypokalemia repleted   #4 remote history of breast cancer appreciate Dr. Virgie Dad note.  DVT prophylaxis:Lovenox Code Status:Full Family Communication:Discussed with husband who is also my patient.  Disposition  Plan:Home after admit Consults called:None       Nutrition Problem: Increased nutrient needs Etiology: (COPD)     Signs/Symptoms: estimated needs    Interventions: Ensure Enlive (each supplement provides 350kcal and 20 grams of protein)  Estimated body mass index is 22.92 kg/m as calculated from the following:   Height as of this encounter: 5\' 6"  (1.676 m).   Weight as of this encounter: 64.4 kg.   Subjective: Patient resting in bed nausea better no diarrhea cough better concerned about her husband  Objective: Vitals:   10/26/18 0444 10/26/18 1501 10/26/18 2013 10/27/18 0447  BP: (!) 91/57 112/73 99/63 101/61  Pulse: 80 87 84 77  Resp: 18 18 19 18   Temp: 99.4 F (37.4 C) 98.3 F (36.8 C) 98.5 F (36.9 C) 98.1 F (36.7 C)  TempSrc: Oral Oral    SpO2: 98% 100% 97% 99%  Weight:      Height:        Intake/Output Summary (Last 24 hours) at 10/27/2018 1033 Last data filed at 10/26/2018 1451 Gross per 24 hour  Intake 120 ml  Output -  Net 120 ml   Filed Weights   10/24/18 0211  Weight: 64.4 kg    Examination:  General exam: Appears calm and comfortable  Respiratory system: Scattered rhonchi and wheezing to auscultation. Respiratory effort normal. Cardiovascular system: S1 & S2 heard, RRR. No JVD, murmurs, rubs, gallops or clicks. No pedal edema. Gastrointestinal system: Abdomen is nondistended, soft and nontender. No organomegaly or masses felt. Normal bowel sounds heard. Central nervous system: Alert and oriented. No focal neurological deficits. Extremities: Symmetric 5 x 5 power. Skin: No rashes, lesions or ulcers Psychiatry: Judgement and insight  appear normal. Mood & affect appropriate.     Data Reviewed: I have personally reviewed following labs and imaging studies  CBC: Recent Labs  Lab 10/24/18 0258 10/25/18 0341 10/26/18 0442 10/27/18 0433  WBC 7.4 4.2 3.4* 3.1*  NEUTROABS 6.7 3.2 2.6 1.8  HGB 11.3* 9.1* 8.5* 9.0*  HCT 32.4* 27.0*  26.2* 26.8*  MCV 90.5 98.2 98.1 97.5  PLT 146* 158 169 262   Basic Metabolic Panel: Recent Labs  Lab 10/24/18 0258 10/25/18 0341 10/26/18 0442 10/27/18 0433  NA 138 139 141 138  K 3.4* 3.6 3.6 4.6  CL 110 113* 117* 108  CO2 19* 19* 20* 24  GLUCOSE 105* 83 85 87  BUN 20 11 8 6   CREATININE 0.72 0.75 0.62 0.60  CALCIUM 8.2* 7.8* 7.8* 8.3*  MG  --  2.0  --  2.1   GFR: Estimated Creatinine Clearance: 72.6 mL/min (by C-G formula based on SCr of 0.6 mg/dL). Liver Function Tests: Recent Labs  Lab 10/24/18 0258 10/25/18 0341 10/26/18 0442 10/27/18 0433  AST 35 26 24 22   ALT 22 16 17 18   ALKPHOS 70 59 61 66  BILITOT 0.5 0.3 0.5 0.3  PROT 6.8 5.2* 5.3* 5.7*  ALBUMIN 3.5 2.7* 2.7* 2.6*   No results for input(s): LIPASE, AMYLASE in the last 168 hours. No results for input(s): AMMONIA in the last 168 hours. Coagulation Profile: No results for input(s): INR, PROTIME in the last 168 hours. Cardiac Enzymes: No results for input(s): CKTOTAL, CKMB, CKMBINDEX, TROPONINI in the last 168 hours. BNP (last 3 results) No results for input(s): PROBNP in the last 8760 hours. HbA1C: No results for input(s): HGBA1C in the last 72 hours. CBG: No results for input(s): GLUCAP in the last 168 hours. Lipid Profile: No results for input(s): CHOL, HDL, LDLCALC, TRIG, CHOLHDL, LDLDIRECT in the last 72 hours. Thyroid Function Tests: No results for input(s): TSH, T4TOTAL, FREET4, T3FREE, THYROIDAB in the last 72 hours. Anemia Panel: No results for input(s): VITAMINB12, FOLATE, FERRITIN, TIBC, IRON, RETICCTPCT in the last 72 hours. Sepsis Labs: Recent Labs  Lab 10/24/18 0258 10/24/18 0645  PROCALCITON 0.65  --   LATICACIDVEN 0.9 0.9    Recent Results (from the past 240 hour(s))  Blood culture (routine x 2)     Status: None (Preliminary result)   Collection Time: 10/24/18  2:59 AM  Result Value Ref Range Status   Specimen Description   Final    BLOOD RIGHT HAND Performed at [facility] [address] [place], [place] [address]    Special Requests   Final    BOTTLES DRAWN AEROBIC AND ANAEROBIC Blood Culture results may not be optimal due to an excessive volume of blood received in culture bottles Performed at [facility] [address], [place], [place] [address]    Culture   Final    NO GROWTH 3 DAYS Performed at [facility], [place] [address], [place], [place] [address]    Report Status PENDING  Incomplete  Blood culture (routine x 2)     Status: None (Preliminary result)   Collection Time: 10/24/18  2:59 AM  Result Value Ref Range Status   Specimen Description   Final    BLOOD RIGHT FOREARM Performed at [facility] [address] [place], [place] [address]    Special Requests   Final    BOTTLES DRAWN AEROBIC AND ANAEROBIC Blood Culture results may not be optimal due to an  excessive volume of blood received in culture bottles Performed at [facility] [address], [place], [place] [address]    Culture   Final    NO GROWTH 3 DAYS Performed at [facility], [place] [address] [place], [place] [address]    Report Status PENDING  Incomplete  Novel Coronavirus, NAA (hospital order; send-out to ref lab)     Status: Abnormal   Collection Time: 10/24/18  5:59 AM  Result Value Ref Range Status   SARS-CoV-2, NAA DETECTED (A) NOT DETECTED Final    Comment: Positive (Detected) results are indicative of active infection with SARS CoV 2. A positive result does not rule out bacterial infection or coinfection with other viruses. Detection of SARS CoV 2 viral RNA may not indicate that SARS CoV 2 is the causative agent for clinical symptoms. Positive and negative predictive values of testing are highly dependent on prevalence. False positive test results are more likely when prevalence is moderate to low. CRITICAL RESULT CALLED TO, READ BACK BY AND VERIFIED WITH: Kittie Plater RN [id number] 10/26/18  A BROWNING (NOTE) The expected result is Negative (Not Detected). The SARS CoV 2 test is intended for the presumptive qualitative  detection of nucleic acid from SARS CoV 2 in upper and lower  respiratory specimens. Testing methodology is real time RT PCR. Test results must be correlated with clinical presentation and  evaluated in the context of other laboratory and epidemiologic data.  Test performance can be affected because the epidemiology and   clinical spectrum of infection caused by SARS CoV 2 is not fully  known. For example, the optimum types of specimens to collect and  when during the course of infection these specimens are most likely  to contain detectable viral RNA may not be known. This test has not been Food and Drug Administration (FDA) cleared or  approved and has been authorized by FDA under an Emergency Use  Authorization (EUA). The test is only authorized for the duration of  the declaration that circumstances exist justifying the authorization  of emergency use of in vitro diagnostic tests for detection and or  diagnosis of SARS CoV 2 under Section 564(b)(1) of the Act, 21 U.S.C.  section [phone] 3(b)(1), unless the authorization is terminated or  revoked sooner. [facility] is certified under the  Clinical Laboratory Improvement Amendments of 1988 (CLIA), 42 U.S.C.  section [phone], to perform high complexity tests. Performed at [facility] [id number], [place], [place] [address] Laboratory Director: Loleta Books, MD Fact Sheet for Healthcare Providers  BankingDealers.co.za Fact Sheet for Patients  StrictlyIdeas.no Performed at [facility], [place] [address], [place], [place] [address]    Coronavirus Source NASOPHARYNGEAL  Final    Comment: Performed at [facility], [place] [address], [place],  [address]   Respiratory Panel by PCR     Status: None   Collection Time: 10/24/18  5:59 AM  Result Value Ref Range Status   Adenovirus NOT DETECTED NOT DETECTED Final   Coronavirus 229E NOT DETECTED NOT DETECTED Final    Comment: (NOTE) The Coronavirus on the Respiratory Panel, DOES NOT test for the novel  Coronavirus (2019 nCoV)    Coronavirus HKU1 NOT DETECTED NOT DETECTED Final   Coronavirus NL63 NOT DETECTED NOT DETECTED Final   Coronavirus OC43 NOT DETECTED NOT DETECTED Final   Metapneumovirus NOT DETECTED NOT DETECTED Final   Rhinovirus /  Enterovirus NOT DETECTED NOT DETECTED Final   Influenza A NOT DETECTED NOT DETECTED Final   Influenza B NOT DETECTED NOT DETECTED Final   Parainfluenza Virus 1 NOT DETECTED NOT DETECTED Final   Parainfluenza Virus 2 NOT DETECTED NOT DETECTED Final   Parainfluenza Virus 3 NOT DETECTED NOT DETECTED Final   Parainfluenza Virus 4 NOT DETECTED NOT DETECTED Final   Respiratory Syncytial Virus NOT DETECTED NOT DETECTED Final   Bordetella pertussis NOT DETECTED NOT DETECTED Final   Chlamydophila pneumoniae NOT DETECTED NOT DETECTED Final   Mycoplasma pneumoniae NOT DETECTED NOT DETECTED Final    Comment: Performed at [facility], [place] [address], [place], [place] [address]         Radiology Studies: No results found.      Scheduled Meds: . albuterol  2 puff Inhalation Q6H  . chlorpheniramine-HYDROcodone  5 mL Oral TID  . enoxaparin (LOVENOX) injection  40 mg Subcutaneous Daily  . feeding supplement  1 Container Oral Q24H  . feeding supplement (ENSURE ENLIVE)  237 mL Oral BID BM  . hydroxychloroquine  200 mg Oral BID  . levothyroxine  125 mcg Oral QAC breakfast  . loratadine  10 mg Oral Daily  . montelukast  10 mg Oral QHS  . multivitamin with minerals  1 tablet Oral Daily  . umeclidinium-vilanterol  1 puff Inhalation Daily  . vitamin C  500 mg Oral Daily  . zinc sulfate  220 mg Oral Daily   Continuous Infusions:   LOS: 3 days     Georgette Shell, MD [facility]  If 7PM-7AM, please contact night-coverage [email or web address] Password [facility] 10/27/2018, 10:33 AM

## 2018-10-27 NOTE — Progress Notes (Signed)
COURTESY NOTE: 58 y/o [place] woman with  COPD admitted with fever and SOB and found to be positive COVID 19.  I follow Stacy Fernandez for a remote history of breast cancer, and the point of this note is to document that her breast cancer history is irrelevant to considerations of treatment as she is likely cured from that disease.  I greatly appreciate your care of this patient!  GM  Her breast cancer summary is as follows:  58 y.o. 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  (2) status post bilateral mastectomies with left sentinel lymph node sampling 04/02/2014, showing:             (a) on the right, benign breast tissue including a single negative lymph node             (b) On  the left, a  pT1c pN0, stage IA invasive ductal carcinoma, grade 3, with negative margins  (3) adjuvant chemotherapy started 05/08/2014, consisting of carboplatin and docetaxel given every 3 weeks x6, together with trastuzumab, with neulasta day 2              (a) docetaxel removed from final cycle 08/21/2014 because of persistent neuropathy symptoms  (4) trastuzumab continued to complete a year (last dose 04/30/2015)             (a) echo 02/04/15 showed a well-preserved ejection fraction  (5) reconstruction with bilateral silicone implants 25/42/7062 ([place])  (6) tobacco abuse: The patient quit smoking 03/30/2014, resumed September 2018  (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

## 2018-10-27 NOTE — Progress Notes (Signed)
Pt's O2 saturations 84-86% on room air. Pt c/o shortness of breath. Pt placed on 2LPM O2 via [place]. O2 saturations increased to 95%. Dr. Zigmund Daniel made aware.

## 2018-10-28 LAB — COMPREHENSIVE METABOLIC PANEL
ALT: 21 U/L (ref 0–44)
AST: 28 U/L (ref 15–41)
Albumin: 2.8 g/dL — ABNORMAL LOW (ref 3.5–5.0)
Alkaline Phosphatase: 69 U/L (ref 38–126)
Anion gap: 9 (ref 5–15)
BUN: 7 mg/dL (ref 6–20)
CO2: 24 mmol/L (ref 22–32)
Calcium: 8.4 mg/dL — ABNORMAL LOW (ref 8.9–10.3)
Chloride: 107 mmol/L (ref 98–111)
Creatinine, Ser: 0.55 mg/dL (ref 0.44–1.00)
GFR calc Af Amer: >60 mL/min (ref >60)
GFR calc non Af Amer: >60 mL/min (ref >60)
Glucose, Bld: 83 mg/dL (ref 70–99)
Potassium: 3.7 mmol/L (ref 3.5–5.1)
Sodium: 140 mmol/L (ref 135–145)
Total Bilirubin: 0.5 mg/dL (ref 0.3–1.2)
Total Protein: 5.9 g/dL — ABNORMAL LOW (ref 6.5–8.1)

## 2018-10-28 LAB — CBC WITH DIFFERENTIAL/PLATELET
Abs Immature Granulocytes: 0.01 10*3/uL (ref 0.00–0.07)
Basophils Absolute: 0 10*3/uL (ref 0.0–0.1)
Basophils Relative: 1 %
Eosinophils Absolute: 0.1 10*3/uL (ref 0.0–0.5)
Eosinophils Relative: 2 %
HCT: 29 % — ABNORMAL LOW (ref 36.0–46.0)
Hemoglobin: 9.9 g/dL — ABNORMAL LOW (ref 12.0–15.0)
Immature Granulocytes: 0 %
Lymphocytes Relative: 29 %
Lymphs Abs: 0.9 10*3/uL (ref 0.7–4.0)
MCH: 31.8 pg (ref 26.0–34.0)
MCHC: 34.1 g/dL (ref 30.0–36.0)
MCV: 93.2 fL (ref 80.0–100.0)
Monocytes Absolute: 0.3 10*3/uL (ref 0.1–1.0)
Monocytes Relative: 10 %
Neutro Abs: 1.9 10*3/uL (ref 1.7–7.7)
Neutrophils Relative %: 58 %
Platelets: 243 10*3/uL (ref 150–400)
RBC: 3.11 MIL/uL — ABNORMAL LOW (ref 3.87–5.11)
RDW: 12.3 % (ref 11.5–15.5)
WBC: 3.2 10*3/uL — ABNORMAL LOW (ref 4.0–10.5)
nRBC: 0 % (ref 0.0–0.2)

## 2018-10-28 MED ORDER — POTASSIUM CHLORIDE CRYS ER 20 MEQ PO TBCR
40.0000 meq | EXTENDED_RELEASE_TABLET | Freq: Once | ORAL | Status: AC
  Administered 2018-10-28: 40 meq via ORAL
  Filled 2018-10-28: qty 2

## 2018-10-28 NOTE — Progress Notes (Signed)
PROGRESS NOTE    Stacy Fernandez  OXB:[id number] DOB: April 02, 1961 DOA: 10/24/2018 PCP: Jettie Booze, NP   Brief Narrative 58 y.o.femalewith medical history significant ofCOPD. Patient has been feeling ill for several days. Looks like she was started on doxycycline back on 4/1. Her husband is also sick. Was recently admitted to hospital here, he now has confirmed COVID-19!  Patient presents to the ED with c/o worsening SOB, doesn't use O2at baseline. This AM had fever 102.4. Dyspnea significantly worsening during day today. She checked her O2 sat at home at it was 86% so EMS called.   ED Course:Tm 99.5. HR 101. WBC 7.4k. BP 111/68. CXR shows BLL infiltrates. Now with new 2L O2 requirement  Assessment & Plan:   Principal Problem:   Suspected Covid-19 Virus Infection Active Problems:   COPD (chronic obstructive pulmonary disease) ([place])   #1 acute hypoxic respiratory failure secondary to COVID-19 . Her chest x-ray showed patchy bibasilar opacities right greater than left suspicious for pneumonia including atypical and viral infection and emphysema. She received doxycycline as an outpatient. Patient's husband is COVID-19 positive but admitted to [facility] long hospital. Continue MDI, Tessalon Perles, Robitussin vitamin C and zinc and chloroquine. She is on 2 L of oxygen saturation above 92%.  Patient dropped her saturation to 80s on room air.  If she is stable overnight we will plan on discharge her home with 2 L of oxygen. Her main complaints are nausea and diarrhea patient has been receiving Phenergan and Zofran.  Diarrhea has been resolved. Continue Claritin and Singulair.Dc ivf.EKGqt normal.  #2 hypothyroidism continue Synthroid  #3 hypokalemia repleted  #4 remote history of breast cancer appreciate Dr. Virgie Dad note.  DVT prophylaxis:Lovenox Code Status:Full Family Communication:Discussed with husband who is also my patient.  Disposition  Plan:Home   Nutrition Problem: Increased nutrient needs Etiology: (COPD)     Signs/Symptoms: estimated needs    Interventions: Ensure Enlive (each supplement provides 350kcal and 20 grams of protein)  Estimated body mass index is 22.92 kg/m as calculated from the following:   Height as of this encounter: 5\' 6"  (1.676 m).   Weight as of this encounter: 64.4 kg.    Subjective: Complains of nausea continues to have a cough had to go back on oxygen due to hypoxia on room air with saturation in the mid 80s.  Objective: Vitals:   10/27/18 1550 10/27/18 1555 10/27/18 2100 10/28/18 0456  BP:   113/77 108/72  Pulse:   89 72  Resp:   16 18  Temp:   98.5 F (36.9 C) 97.9 F (36.6 C)  TempSrc:   Oral   SpO2: (!) 85% 95% 98% 96%  Weight:      Height:        Intake/Output Summary (Last 24 hours) at 10/28/2018 0916 Last data filed at 10/28/2018 0200 Gross per 24 hour  Intake 480 ml  Output -  Net 480 ml   Filed Weights   10/24/18 0211  Weight: 64.4 kg    Examination:  General exam: Appears calm and comfortable  Respiratory system: Scattered rhonchi to auscultation. Respiratory effort normal. Cardiovascular system: S1 & S2 heard, RRR. No JVD, murmurs, rubs, gallops or clicks. No pedal edema. Gastrointestinal system: Abdomen is nondistended, soft and nontender. No organomegaly or masses felt. Normal bowel sounds heard. Central nervous system: Alert and oriented. No focal neurological deficits. Extremities: Symmetric 5 x 5 power. Skin: No rashes, lesions or ulcers Psychiatry: Judgement and insight appear normal. Mood & affect  appropriate.     Data Reviewed: I have personally reviewed following labs and imaging studies  CBC: Recent Labs  Lab 10/24/18 0258 10/25/18 0341 10/26/18 0442 10/27/18 0433 10/28/18 0426  WBC 7.4 4.2 3.4* 3.1* 3.2*  NEUTROABS 6.7 3.2 2.6 1.8 1.9  HGB 11.3* 9.1* 8.5* 9.0* 9.9*  HCT 32.4* 27.0* 26.2* 26.8* 29.0*  MCV 90.5 98.2 98.1 97.5  93.2  PLT 146* 158 169 193 700   Basic Metabolic Panel: Recent Labs  Lab 10/24/18 0258 10/25/18 0341 10/26/18 0442 10/27/18 0433 10/28/18 0426  NA 138 139 141 138 140  K 3.4* 3.6 3.6 4.6 3.7  CL 110 113* 117* 108 107  CO2 19* 19* 20* 24 24  GLUCOSE 105* 83 85 87 83  BUN 20 11 8 6 7   CREATININE 0.72 0.75 0.62 0.60 0.55  CALCIUM 8.2* 7.8* 7.8* 8.3* 8.4*  MG  --  2.0  --  2.1  --    GFR: Estimated Creatinine Clearance: 72.6 mL/min (by C-G formula based on SCr of 0.55 mg/dL). Liver Function Tests: Recent Labs  Lab 10/24/18 0258 10/25/18 0341 10/26/18 0442 10/27/18 0433 10/28/18 0426  AST 35 26 24 22 28   ALT 22 16 17 18 21   ALKPHOS 70 59 61 66 69  BILITOT 0.5 0.3 0.5 0.3 0.5  PROT 6.8 5.2* 5.3* 5.7* 5.9*  ALBUMIN 3.5 2.7* 2.7* 2.6* 2.8*   No results for input(s): LIPASE, AMYLASE in the last 168 hours. No results for input(s): AMMONIA in the last 168 hours. Coagulation Profile: No results for input(s): INR, PROTIME in the last 168 hours. Cardiac Enzymes: No results for input(s): CKTOTAL, CKMB, CKMBINDEX, TROPONINI in the last 168 hours. BNP (last 3 results) No results for input(s): PROBNP in the last 8760 hours. HbA1C: No results for input(s): HGBA1C in the last 72 hours. CBG: No results for input(s): GLUCAP in the last 168 hours. Lipid Profile: No results for input(s): CHOL, HDL, LDLCALC, TRIG, CHOLHDL, LDLDIRECT in the last 72 hours. Thyroid Function Tests: No results for input(s): TSH, T4TOTAL, FREET4, T3FREE, THYROIDAB in the last 72 hours. Anemia Panel: No results for input(s): VITAMINB12, FOLATE, FERRITIN, TIBC, IRON, RETICCTPCT in the last 72 hours. Sepsis Labs: Recent Labs  Lab 10/24/18 0258 10/24/18 0645  PROCALCITON 0.65  --   LATICACIDVEN 0.9 0.9    Recent Results (from the past 240 hour(s))  Blood culture (routine x 2)     Status: None (Preliminary result)   Collection Time: 10/24/18  2:59 AM  Result Value Ref Range Status   Specimen  Description   Final    BLOOD RIGHT HAND Performed at [facility] [address] [place], [place] [address]    Special Requests   Final    BOTTLES DRAWN AEROBIC AND ANAEROBIC Blood Culture results may not be optimal due to an excessive volume of blood received in culture bottles Performed at [facility] [address], [place], [place] [address]    Culture   Final    NO GROWTH 3 DAYS Performed at [facility], [place] [address] [place], [place] [address]    Report Status PENDING  Incomplete  Blood culture (routine x 2)     Status: None (Preliminary result)   Collection Time: 10/24/18  2:59 AM  Result Value Ref Range Status   Specimen Description   Final    BLOOD RIGHT FOREARM Performed at [facility] [address] [place], [place] [address]  Special Requests   Final    BOTTLES DRAWN AEROBIC AND ANAEROBIC Blood Culture results may not be optimal due to an excessive volume of blood received in culture bottles Performed at [facility] [address] [place], [place] [address]    Culture   Final    NO GROWTH 3 DAYS Performed at [facility], [place] [address] [place], [place] [address]    Report Status PENDING  Incomplete  Novel Coronavirus, NAA (hospital order; send-out to ref lab)     Status: Abnormal   Collection Time: 10/24/18  5:59 AM  Result Value Ref Range Status   SARS-CoV-2, NAA DETECTED (A) NOT DETECTED Final    Comment: Positive (Detected) results are indicative of active infection with SARS CoV 2. A positive result does not rule out bacterial infection or coinfection with other viruses. Detection of SARS CoV 2 viral RNA may not indicate that SARS CoV 2 is the causative agent for clinical symptoms. Positive and negative predictive values of testing are highly dependent on prevalence. False positive test results are more likely when prevalence is moderate to low. CRITICAL RESULT  CALLED TO, READ BACK BY AND VERIFIED WITH: Kittie Plater RN [id number] 10/26/18 A BROWNING (NOTE) The expected result is Negative (Not Detected). The SARS CoV 2 test is intended for the presumptive qualitative  detection of nucleic acid from SARS CoV 2 in upper and lower  respiratory specimens. Testing methodology is real time RT PCR. Test results must be correlated with clinical presentation and  evaluated in the context of other laboratory and epidemiologic data.  Test performance can be affected because the epidemiology and   clinical spectrum of infection caused by SARS CoV 2 is not fully  known. For example, the optimum types of specimens to collect and  when during the course of infection these specimens are most likely  to contain detectable viral RNA may not be known. This test has not been Food and Drug Administration (FDA) cleared or  approved and has been authorized by FDA under an Emergency Use  Authorization (EUA). The test is only authorized for the duration of  the declaration that circumstances exist justifying the authorization  of emergency use of in vitro diagnostic tests for detection and or  diagnosis of SARS CoV 2 under Section 564(b)(1) of the Act, 21 U.S.C.  section [phone] 3(b)(1), unless the authorization is terminated or  revoked sooner. [facility] is certified under the  Clinical Laboratory Improvement Amendments of 1988 (CLIA), 42 U.S.C.  section [phone], to perform high complexity tests. Performed at [facility] [id number], [place], [place] [address] Laboratory Director: Loleta Books, MD Fact Sheet for Healthcare Providers  BankingDealers.co.za Fact Sheet for Patients  StrictlyIdeas.no Performed at [facility], [place] [address] [place], [place] [address]    Coronavirus Source NASOPHARYNGEAL  Final    Comment: Performed at [facility], [place] [address], [place], [place] [address]  Respiratory Panel by PCR     Status: None   Collection Time: 10/24/18  5:59 AM  Result Value Ref Range Status   Adenovirus NOT DETECTED NOT DETECTED Final   Coronavirus 229E NOT DETECTED NOT DETECTED Final    Comment: (NOTE) The Coronavirus on the Respiratory Panel, DOES NOT test for the novel  Coronavirus (2019 nCoV)    Coronavirus HKU1 NOT DETECTED NOT DETECTED Final   Coronavirus NL63 NOT DETECTED NOT  DETECTED Final   Coronavirus OC43 NOT DETECTED NOT DETECTED Final   Metapneumovirus NOT DETECTED NOT DETECTED Final   Rhinovirus / Enterovirus NOT DETECTED NOT DETECTED Final   Influenza A NOT DETECTED NOT DETECTED Final   Influenza B NOT DETECTED NOT DETECTED Final   Parainfluenza Virus 1 NOT DETECTED NOT DETECTED Final   Parainfluenza Virus 2 NOT DETECTED NOT DETECTED Final   Parainfluenza Virus 3 NOT DETECTED NOT DETECTED Final   Parainfluenza Virus 4 NOT DETECTED NOT DETECTED Final   Respiratory Syncytial Virus NOT DETECTED NOT DETECTED Final   Bordetella pertussis NOT DETECTED NOT DETECTED Final   Chlamydophila pneumoniae NOT DETECTED NOT DETECTED Final   Mycoplasma pneumoniae NOT DETECTED NOT DETECTED Final    Comment: Performed at [facility], [place] [address] [place],  [address]         Radiology Studies: No results found.      Scheduled Meds: . albuterol  2 puff Inhalation Q6H  . chlorpheniramine-HYDROcodone  5 mL Oral TID  . enoxaparin (LOVENOX) injection  40 mg Subcutaneous Daily  . feeding supplement  1 Container Oral Q24H  . feeding supplement (ENSURE ENLIVE)  237 mL Oral BID BM  . hydroxychloroquine  200 mg Oral BID  . levothyroxine  125 mcg Oral QAC breakfast  . loratadine  10 mg Oral Daily  . montelukast  10 mg Oral QHS  . multivitamin with minerals  1 tablet Oral Daily  . umeclidinium-vilanterol  1 puff Inhalation Daily  . vitamin C  500 mg Oral Daily  . zinc sulfate  220  mg Oral Daily   Continuous Infusions:   LOS: 4 days     Georgette Shell, MD [facility]  If 7PM-7AM, please contact night-coverage [email or web address] Password [facility] 10/28/2018, 9:16 AM

## 2018-10-28 NOTE — TOC Initial Note (Signed)
Transition of Care [facility]) - Initial/Assessment Note    Patient Details  Name: Stacy Fernandez MRN: [id number] Date of Birth: August 11, 1960  Transition of Care [facility]) CM/SW Contact:    Purcell Mouton, RN Phone Number: 10/28/2018, 1:09 PM  Clinical Narrative:                   Expected Discharge Plan: Home/Self Care Barriers to Discharge: No Barriers Identified   Patient Goals and CMS Choice Patient states their goals for this hospitalization and ongoing recovery are:: To get better   Choice offered to / list presented to : Patient  Expected Discharge Plan and Services Expected Discharge Plan: Home/Self Care   Discharge Planning Services: CM Consult                         HH Arranged: Refused HH(Related to insurance and co pay)    Prior Living Arrangements/Services   Lives with:: Spouse                   Activities of Daily Living Home Assistive Devices/Equipment: None ADL Screening (condition at time of admission) Patient's cognitive ability adequate to safely complete daily activities?: Yes Is the patient deaf or have difficulty hearing?: No Does the patient have difficulty seeing, even when wearing glasses/contacts?: No Does the patient have difficulty concentrating, remembering, or making decisions?: Yes Patient able to express need for assistance with ADLs?: Yes Does the patient have difficulty dressing or bathing?: No Independently performs ADLs?: Yes (appropriate for developmental age) Does the patient have difficulty walking or climbing stairs?: No Weakness of Legs: None Weakness of Arms/Hands: None  Permission Sought/Granted                  Emotional Assessment Appearance:: Appears older than stated age   Affect (typically observed): Accepting, Appropriate Orientation: : Oriented to Self, Oriented to Place, Oriented to  Time, Oriented to Situation      Admission diagnosis:  SOB (shortness of breath) [R06.02] Hypoxia  [R09.02] Community acquired pneumonia, unspecified laterality [J18.9] Patient Active Problem List   Diagnosis Date Noted  . Suspected Covid-19 Virus Infection 10/24/2018  . Vertigo 11/30/2016  . 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 Surgery follow-up 07/17/2015  . Genetic testing 06/03/2015  . Pain in joint, pelvic region and thigh 04/30/2015  . Brain aneurysm 04/25/2015  . Back pain 04/19/2015  . Renal insufficiency 04/19/2015  . Lung nodule 12/11/2014  . Ankle edema 10/02/2014  . Acquired absence of breast and nipple 09/25/2014  . Neuropathy due to chemotherapeutic drug ([place]) 08/07/2014  . Chemotherapy induced neutropenia ([place]) 08/07/2014  . Insomnia 07/31/2014  . 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 Atherosclerosis 07/17/2014  . Irregular bowel habits 06/27/2014  . Mucositis due to chemotherapy 06/05/2014  . Diarrhea 05/29/2014  . Pain at surgical site 05/07/2014  . Family history of malignant neoplasm of gastrointestinal tract 03/07/2014  . Family history of malignant neoplasm of breast 03/07/2014  . Family history of malignant neoplasm of ovary 03/07/2014  . Malignant neoplasm of upper-outer quadrant of left breast in female, estrogen receptor negative ([place]) 02/26/2014  . CAD (coronary artery disease) 04/16/2013  . Intractable migraine without aura 02/16/2013  . COPD exacerbation ([place]) 02/01/2013  . S/P Nissen fundoplication (without gastrostomy tube) procedure 01/05/2013  . COPD (chronic obstructive pulmonary disease) ([place]) 01/03/2013  . Chest pain 12/25/2012  . Hypothyroidism 12/25/2012  . Tobacco abuse 12/25/2012   PCP:  Jettie Booze,  NP Pharmacy:   [id number] [place], [place] [address] Phone: [phone] Fax: [phone]  [facility] [place], [place] [address] Phone: [phone] Fax:  [phone]     Social Determinants of Health (SDOH) Interventions    Readmission Risk Interventions No flowsheet data found.

## 2018-10-28 NOTE — Progress Notes (Signed)
SATURATION QUALIFICATIONS: (This note is used to comply with regulatory documentation for home oxygen)  Patient Saturations on Room Air at Rest = 92%  Patient Saturations on Room Air while Ambulating = 84%  Patient Saturations on 2 Liters of oxygen while Ambulating = 93%  Please briefly explain why patient needs home oxygen: while on room air and ambulating sat dropped to 84% and increased to 93% on 2L [place].

## 2018-10-29 ENCOUNTER — Other Ambulatory Visit: Payer: Self-pay

## 2018-10-29 LAB — COMPREHENSIVE METABOLIC PANEL
ALT: 37 U/L (ref 0–44)
AST: 52 U/L — ABNORMAL HIGH (ref 15–41)
Albumin: 3.1 g/dL — ABNORMAL LOW (ref 3.5–5.0)
Alkaline Phosphatase: 75 U/L (ref 38–126)
Anion gap: 6 (ref 5–15)
BUN: 8 mg/dL (ref 6–20)
CO2: 28 mmol/L (ref 22–32)
Calcium: 8.7 mg/dL — ABNORMAL LOW (ref 8.9–10.3)
Chloride: 106 mmol/L (ref 98–111)
Creatinine, Ser: 0.65 mg/dL (ref 0.44–1.00)
GFR calc Af Amer: >60 mL/min (ref >60)
GFR calc non Af Amer: >60 mL/min (ref >60)
Glucose, Bld: 88 mg/dL (ref 70–99)
Potassium: 4.4 mmol/L (ref 3.5–5.1)
Sodium: 140 mmol/L (ref 135–145)
Total Bilirubin: 0.5 mg/dL (ref 0.3–1.2)
Total Protein: 6.3 g/dL — ABNORMAL LOW (ref 6.5–8.1)

## 2018-10-29 LAB — CBC WITH DIFFERENTIAL/PLATELET
Abs Immature Granulocytes: 0.02 10*3/uL (ref 0.00–0.07)
Basophils Absolute: 0 10*3/uL (ref 0.0–0.1)
Basophils Relative: 1 %
Eosinophils Absolute: 0.1 10*3/uL (ref 0.0–0.5)
Eosinophils Relative: 2 %
HCT: 31.3 % — ABNORMAL LOW (ref 36.0–46.0)
Hemoglobin: 10.7 g/dL — ABNORMAL LOW (ref 12.0–15.0)
Immature Granulocytes: 1 %
Lymphocytes Relative: 22 %
Lymphs Abs: 0.9 10*3/uL (ref 0.7–4.0)
MCH: 32.3 pg (ref 26.0–34.0)
MCHC: 34.2 g/dL (ref 30.0–36.0)
MCV: 94.6 fL (ref 80.0–100.0)
Monocytes Absolute: 0.4 10*3/uL (ref 0.1–1.0)
Monocytes Relative: 10 %
Neutro Abs: 2.7 10*3/uL (ref 1.7–7.7)
Neutrophils Relative %: 64 %
Platelets: 283 10*3/uL (ref 150–400)
RBC: 3.31 MIL/uL — ABNORMAL LOW (ref 3.87–5.11)
RDW: 12.3 % (ref 11.5–15.5)
WBC: 4 10*3/uL (ref 4.0–10.5)
nRBC: 0 % (ref 0.0–0.2)

## 2018-10-29 LAB — CULTURE, BLOOD (ROUTINE X 2)
Culture: NO GROWTH
Culture: NO GROWTH

## 2018-10-29 MED ORDER — ACETAMINOPHEN 325 MG PO TABS: 650.0000 mg | ORAL_TABLET | Freq: Four times a day (QID) | ORAL | Status: DC | PRN

## 2018-10-29 MED ORDER — HYDROCOD POLST-CPM POLST ER 10-8 MG/5ML PO SUER: 5.0000 mL | Freq: Three times a day (TID) | ORAL | 0 refills | Status: DC

## 2018-10-29 MED ORDER — ZINC SULFATE 220 (50 ZN) MG PO CAPS: 220.0000 mg | ORAL_CAPSULE | Freq: Every day | ORAL | Status: DC

## 2018-10-29 MED ORDER — ONDANSETRON HCL 4 MG PO TABS: 4.0000 mg | ORAL_TABLET | Freq: Four times a day (QID) | ORAL | 0 refills | Status: DC | PRN

## 2018-10-29 MED ORDER — ASCORBIC ACID 500 MG PO TABS: 500.0000 mg | ORAL_TABLET | Freq: Every day | ORAL | Status: DC

## 2018-10-29 NOTE — TOC Initial Note (Signed)
Transition of Care [facility]) - Initial/Assessment Note    Patient Details  Name: Stacy Fernandez MRN: [id number] Date of Birth: [birth year]  [facility]) CM/SW Contact:    Erenest Rasher, RN Phone Number: 10/29/2018, 11:53 AM  Clinical Narrative:                 Spoke to pt and states her son can pick up meds and drop on her porch. [place] for oxygen for home to be delivered to room prior to dc. Contacted [facility] to verify they can take pt home with portable oxygen.   Expected Discharge Plan: Home/Self Care Barriers to Discharge: No Barriers Identified   Patient Goals and CMS Choice Patient states their goals for this hospitalization and ongoing recovery are:: To get better   Choice offered to / list presented to : Patient  Expected Discharge Plan and Services Expected Discharge Plan: Home/Self Care   Discharge Planning Services: CM Consult Post Acute Care Choice: Durable Medical Equipment Living arrangements for the past 2 months: Single Family Home Expected Discharge Date: 10/29/18               DME Arranged: Oxygen DME Agency: [employer] HH Arranged: Refused HH(Related to insurance and co pay)    Prior Living Arrangements/Services Living arrangements for the past 2 months: Single Family Home Lives with:: Spouse Patient language and need for interpreter reviewed:: Yes Do you feel safe going back to the place where you live?: Yes      Need for Family Participation in Patient Care: No (Comment) Care giver support system in place?: No (comment)   Criminal Activity/Legal Involvement Pertinent to Current Situation/Hospitalization: No - Comment as needed  Activities of Daily Living Home Assistive Devices/Equipment: None ADL Screening (condition at time of admission) Patient's cognitive ability adequate to safely complete daily activities?: Yes Is the patient deaf or have difficulty hearing?: No Does the patient have difficulty seeing, even when  wearing glasses/contacts?: No Does the patient have difficulty concentrating, remembering, or making decisions?: Yes Patient able to express need for assistance with ADLs?: Yes Does the patient have difficulty dressing or bathing?: No Independently performs ADLs?: Yes (appropriate for developmental age) Does the patient have difficulty walking or climbing stairs?: No Weakness of Legs: None Weakness of Arms/Hands: None  Permission Sought/Granted Permission sought to share information with : Case Manager, Family Supports, Other (comment), PCP Permission granted to share information with : Yes, Verbal Permission Granted     Permission granted to share info w AGENCY: [place] granted to share info w Relationship: son     Emotional Assessment Appearance:: Appears older than stated age   Affect (typically observed): Accepting, Appropriate Orientation: : Oriented to Self, Oriented to Place, Oriented to  Time, Oriented to Situation Alcohol / Substance Use: Tobacco Use Psych Involvement: No (comment)  Admission diagnosis:  SOB (shortness of breath) [R06.02] Hypoxia [R09.02] Community acquired pneumonia, unspecified laterality [J18.9] Patient Active Problem List   Diagnosis Date Noted  . Suspected Covid-19 Virus Infection 10/24/2018  . Vertigo 11/30/2016  . 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 Surgery follow-up 07/17/2015  . Genetic testing 06/03/2015  . Pain in joint, pelvic region and thigh 04/30/2015  . Brain aneurysm 04/25/2015  . Back pain 04/19/2015  . Renal insufficiency 04/19/2015  . Lung nodule 12/11/2014  . Ankle edema 10/02/2014  . Acquired absence of breast and nipple 09/25/2014  . Neuropathy due to chemotherapeutic drug ([place]) 08/07/2014  . Chemotherapy induced neutropenia (  [place]) 08/07/2014  . Insomnia 07/31/2014  . 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 Atherosclerosis 07/17/2014  . Irregular bowel habits 06/27/2014  . Mucositis due to chemotherapy  06/05/2014  . Diarrhea 05/29/2014  . Pain at surgical site 05/07/2014  . Family history of malignant neoplasm of gastrointestinal tract 03/07/2014  . Family history of malignant neoplasm of breast 03/07/2014  . Family history of malignant neoplasm of ovary 03/07/2014  . Malignant neoplasm of upper-outer quadrant of left breast in female, estrogen receptor negative ([place]) 02/26/2014  . CAD (coronary artery disease) 04/16/2013  . Intractable migraine without aura 02/16/2013  . COPD exacerbation ([place]) 02/01/2013  . S/P Nissen fundoplication (without gastrostomy tube) procedure 01/05/2013  . COPD (chronic obstructive pulmonary disease) ([place]) 01/03/2013  . Chest pain 12/25/2012  . Hypothyroidism 12/25/2012  . Tobacco abuse 12/25/2012   PCP:  Jettie Booze, NP Pharmacy:   [id number] [place], [place] [address] Phone: [phone] Fax: [phone]  [facility] [place], [place] [address] Phone: [phone] Fax: [phone]     Social Determinants of Health (SDOH) Interventions    Readmission Risk Interventions No flowsheet data found.

## 2018-10-29 NOTE — Discharge Instructions (Addendum)
Coronavirus (COVID-19) Are you at risk?  Are you at risk for the Coronavirus (COVID-19)?  To be considered HIGH RISK for Coronavirus (COVID-19), you have to meet the following criteria:   Traveled to [place], [place], [place], [place] or [place]; or in the [place] to [place], [place], [place], or [place]; and have fever, cough, and shortness of breath within the last 2 weeks of travel OR  Been in close contact with a person diagnosed with COVID-19 within the last 2 weeks and have fever, cough, and shortness of breath  IF YOU DO NOT MEET THESE CRITERIA, YOU ARE CONSIDERED LOW RISK FOR COVID-19.    Person Under Monitoring Name: Stacy Fernandez  Location: [address] [place] [address]   Infection Prevention Recommendations for Individuals Confirmed to have, or Being Evaluated for, 2019 Novel Coronavirus (COVID-19) Infection Who Receive Care at Home  Individuals who are confirmed to have, or are being evaluated for, COVID-19 should follow the prevention steps below until a healthcare provider or local or state health department says they can return to normal activities.  Stay home except to get medical care You should restrict activities outside your home, except for getting medical care. Do not go to work, school, or public areas, and do not use public transportation or taxis.  Call ahead before visiting your doctor Before your medical appointment, call the healthcare provider and tell them that you have, or are being evaluated for, COVID-19 infection. This will help the healthcare providers office take steps to keep other people from getting infected. Ask your healthcare provider to call the local or state health department.  Monitor your symptoms Seek prompt medical attention if your illness is worsening (e.g., difficulty breathing). Before going to your medical appointment, call the healthcare provider and tell them that you have, or are being  evaluated for, COVID-19 infection. Ask your healthcare provider to call the local or state health department.  Wear a facemask You should wear a facemask that covers your nose and mouth when you are in the same room with other people and when you visit a healthcare provider. People who live with or visit you should also wear a facemask while they are in the same room with you.  Separate yourself from other people in your home As much as possible, you should stay in a different room from other people in your home. Also, you should use a separate bathroom, if available.  Avoid sharing household items You should not share dishes, drinking glasses, cups, eating utensils, towels, bedding, or other items with other people in your home. After using these items, you should wash them thoroughly with soap and water.  Cover your coughs and sneezes Cover your mouth and nose with a tissue when you cough or sneeze, or you can cough or sneeze into your sleeve. Throw used tissues in a lined trash can, and immediately wash your hands with soap and water for at least 20 seconds or use an alcohol-based hand rub.  Wash your [facility] your hands often and thoroughly with soap and water for at least 20 seconds. You can use an alcohol-based hand sanitizer if soap and water are not available and if your hands are not visibly dirty. Avoid touching your eyes, nose, and mouth with unwashed hands.   Prevention Steps for Caregivers and Household Members of Individuals Confirmed to have, or Being Evaluated for, COVID-19 Infection Being Cared for in the Home  If you live with,  or provide care at home for, a person confirmed to have, or being evaluated for, COVID-19 infection please follow these guidelines to prevent infection:  Follow healthcare providers instructions Make sure that you understand and can help the patient follow any healthcare provider instructions for all care.  Provide for the patients  basic needs You should help the patient with basic needs in the home and provide support for getting groceries, prescriptions, and other personal needs.  Monitor the patients symptoms If they are getting sicker, call his or her medical provider and tell them that the patient has, or is being evaluated for, COVID-19 infection. This will help the healthcare providers office take steps to keep other people from getting infected. Ask the healthcare provider to call the local or state health department.  Limit the number of people who have contact with the patient If possible, have only one caregiver for the patient. Other household members should stay in another home or place of residence. If this is not possible, they should stay in another room, or be separated from the patient as much as possible. Use a separate bathroom, if available. Restrict visitors who do not have an essential need to be in the home.  Keep older adults, very young children, and other sick people away from the patient Keep older adults, very young children, and those who have compromised immune systems or chronic health conditions away from the patient. This includes people with chronic heart, lung, or kidney conditions, diabetes, and cancer.  Ensure good ventilation Make sure that shared spaces in the home have good air flow, such as from an air conditioner or an opened window, weather permitting.  Wash your hands often Wash your hands often and thoroughly with soap and water for at least 20 seconds. You can use an alcohol based hand sanitizer if soap and water are not available and if your hands are not visibly dirty. Avoid touching your eyes, nose, and mouth with unwashed hands. Use disposable paper towels to dry your hands. If not available, use dedicated cloth towels and replace them when they become wet.  Wear a facemask and gloves Wear a disposable facemask at all times in the room and gloves when you touch or  have contact with the patients blood, body fluids, and/or secretions or excretions, such as sweat, saliva, sputum, nasal mucus, vomit, urine, or feces.  Ensure the mask fits over your nose and mouth tightly, and do not touch it during use. Throw out disposable facemasks and gloves after using them. Do not reuse. Wash your hands immediately after removing your facemask and gloves. If your personal clothing becomes contaminated, carefully remove clothing and launder. Wash your hands after handling contaminated clothing. Place all used disposable facemasks, gloves, and other waste in a lined container before disposing them with other household waste. Remove gloves and wash your hands immediately after handling these items.  Do not share dishes, glasses, or other household items with the patient Avoid sharing household items. You should not share dishes, drinking glasses, cups, eating utensils, towels, bedding, or other items with a patient who is confirmed to have, or being evaluated for, COVID-19 infection. After the person uses these items, you should wash them thoroughly with soap and water.  Wash laundry thoroughly Immediately remove and wash clothes or bedding that have blood, body fluids, and/or secretions or excretions, such as sweat, saliva, sputum, nasal mucus, vomit, urine, or feces, on them. Wear gloves when handling laundry from the patient. Read and  follow directions on labels of laundry or clothing items and detergent. In general, wash and dry with the warmest temperatures recommended on the label.  Clean all areas the individual has used often Clean all touchable surfaces, such as counters, tabletops, doorknobs, bathroom fixtures, toilets, phones, keyboards, tablets, and bedside tables, every day. Also, clean any surfaces that may have blood, body fluids, and/or secretions or excretions on them. Wear gloves when cleaning surfaces the patient has come in contact with. Use a diluted  bleach solution (e.g., dilute bleach with 1 part bleach and 10 parts water) or a household disinfectant with a label that says EPA-registered for coronaviruses. To make a bleach solution at home, add 1 tablespoon of bleach to 1 quart (4 cups) of water. For a larger supply, add  cup of bleach to 1 gallon (16 cups) of water. Read labels of cleaning products and follow recommendations provided on product labels. Labels contain instructions for safe and effective use of the cleaning product including precautions you should take when applying the product, such as wearing gloves or eye protection and making sure you have good ventilation during use of the product. Remove gloves and wash hands immediately after cleaning.  Monitor yourself for signs and symptoms of illness Caregivers and household members are considered close contacts, should monitor their health, and will be asked to limit movement outside of the home to the extent possible. Follow the monitoring steps for close contacts listed on the symptom monitoring form.   ? If you have additional questions, contact your local health department or call the epidemiologist on call at [phone] (available 24/7). ? This guidance is subject to change. For the most up-to-date guidance from [facility], [place], please refer to their website:  [email or web address]  What to do if you are HIGH RISK for COVID-19?   If you are having a medical emergency, call 911.  Seek medical care right away. Before you go to a doctors office, urgent care or emergency department, call ahead and tell them about your recent travel, contact with someone diagnosed with COVID-19, and your symptoms. You should receive instructions from your physicians office regarding next steps of care.   When you arrive at healthcare provider, tell the healthcare staff immediately you have returned from visiting [place], [place], [place], [place] or [place]; or traveled in the [place] to [place], [place], [place], or [place]; in the last two weeks or you have been in close contact with a person diagnosed with COVID-19 in the last 2 weeks.    Tell the health care staff about your symptoms: fever, cough and shortness of breath.  After you have been seen by a medical provider, you will be either: o Tested for (COVID-19) and discharged home on quarantine except to seek medical care if symptoms worsen, and asked to  - Stay home and avoid contact with others until you get your results (4-5 days)  - Avoid travel on public transportation if possible (such as bus, train, or airplane) or o Sent to the Emergency Department by EMS for evaluation, COVID-19 testing, and possible admission depending on your condition and test results.  What to do if you are LOW RISK for COVID-19?  Reduce your risk of any infection by using the same precautions used for avoiding the common cold or flu:   Wash your hands often with soap and warm water for at least 20 seconds.  If soap and water are not readily available, use an alcohol-based hand sanitizer with  at least 60% alcohol.   If coughing or sneezing, cover your mouth and nose by coughing or sneezing into the elbow areas of your shirt or coat, into a tissue or into your sleeve (not your hands).  Avoid shaking hands with others and consider head nods or verbal greetings only.  Avoid touching your eyes, nose, or mouth with unwashed hands.   Avoid close contact with people who are sick.  Avoid places or events with large numbers of people in one location, like concerts or sporting events.  Carefully consider travel plans you have or are making.  If you are planning any travel outside or inside the [place], visit the [facility] webpage for the latest health notices.  If you have some symptoms but not all symptoms, continue to monitor at home and seek medical attention if your symptoms  worsen.  If you are having a medical emergency, call 911.   ADDITIONAL HEALTHCARE OPTIONS FOR PATIENTS  [facility] / e-Visit: [email or web address]         [facility]: [facility]: [phone]                   [facility]: [phone]      Person Under Monitoring Name: Stacy Fernandez  Location: [address] [place] [address]   CORONAVIRUS DISEASE 2019 (COVID-19) Guidance for Persons Under Investigation You are being tested for the virus that causes coronavirus disease 2019 (COVID-19). Public health actions are necessary to ensure protection of your health and the health of others, and to prevent further spread of infection. COVID-19 is caused by a virus that can cause symptoms, such as fever, cough, and shortness of breath. The primary transmission from person to person is by coughing or sneezing. On August 18, 2018, the [place] announced a [employer] Emergency of International Concern and on August 19, 2018 the U.S. Department of Health and Human Services declared a public health emergency. If the virus that causesCOVID-19 spreads in the community, it could have severe public health consequences.  As a person under investigation for COVID-19, the [facility] advises you to adhere to the following guidance until your test results are reported to you. If your test result is positive, you will receive additional information from your provider and your local health department at that time.   Remain at home until you are cleared by your health provider or public health authorities.   Keep a log of visitors to your home using the form provided. Any visitors to your home must be aware of your isolation status.  If you plan to move to a new address or leave the county, notify  the local health department in your county.  Call a doctor or seek care if you have an urgent medical need. Before seeking medical care, call ahead and get instructions from the provider before arriving at the medical office, clinic or hospital. Notify them that you are being tested for the virus that causes COVID-19 so arrangements can be made, as necessary, to prevent transmission to others in the healthcare setting. Next, notify the local health department in your county.  If a medical emergency arises and you need to call 911, inform the first responders that you are being tested for the virus that causes COVID-19. Next, notify the local health department in your county.  Adhere to all  guidance set forth by the [facility] for [facility] of patients that is based on guidance from the Center for Disease Control and Prevention with suspected or confirmed COVID-19. It is provided with this guidance for Persons Under Investigation.  Your health and the health of our community are our top priorities. Public Health officials remain available to provide assistance and counseling to you about COVID-19 and compliance with this guidance.  Provider: ____________________________________________________________ Date: ______/_____/_________  By signing below, you acknowledge that you have read and agree to comply with this Guidance for Persons Under Investigation. ______________________________________________________________ Date: ______/_____/_________  WHO DO I CALL? You can find a list of local health departments here: [email or web address] Health Department: ____________________________________________________________________ Contact Name: ________________________________________________________________________ Telephone: ___________________________________________________________________________  Marice Potter,  [place], Communicable Disease Branch COVID-19 Guidance for Persons Under Investigation September 24, 2018

## 2018-10-29 NOTE — Discharge Summary (Signed)
Physician Discharge Summary  Stacy Fernandez YIR:[id number] DOB: 10/11/1960 DOA: 10/24/2018  PCP: Jettie Booze, NP  Admit date: 10/24/2018 Discharge date: 10/29/2018  Admitted From: Home Disposition:  Home Recommendations for Outpatient Follow-up:  1. Follow up with PCP in 1-2 weeks 2. Please obtain BMP/CBC in one week 3.  Remain on quarantine for 14 days  Home Health none Equipment/Devices oxygen at 2 L  Discharge Condition: Stable and improved CODE STATUS: Full code Diet recommendation: Regular diet Brief/Interim Summary:58 y.o.femalewith medical history significant ofCOPD. Patient has been feeling ill for several days. Looks like she was started on doxycycline back on 4/1. Her husband is also sick. Was recently admitted to hospital here, he now has confirmed COVID-19!  Patient presents to the ED with c/o worsening SOB, doesn't use O2at baseline. This AM had fever 102.4. Dyspnea significantly worsening during day today. She checked her O2 sat at home at it was 86% so EMS called.   ED Course:Tm 99.5. HR 101. WBC 7.4k. BP 111/68. CXR shows BLL infiltrates. Now with new 2L O2 requirement   Discharge Diagnoses:  Principal Problem:   Suspected Covid-19 Virus Infection Active Problems:   COPD (chronic obstructive pulmonary disease) ([place])  #1acute hypoxic respiratory failuresecondary to COVID-19 . Her chest x-ray showed patchy bibasilar opacities right greater than left suspicious for pneumonia including atypical and viral infection and emphysema. She received doxycycline as an outpatient. Patient's husband is COVID-19 positive  admitted to [facility]. Patient remained on 2 L of oxygen throughout the hospital stay.  Up to 84% on room air while ambulating.  She will be discharged home on 2 L of oxygen.  She also received 5-day course of chloroquine. Continue MDI, Tessalon Perles, Robitussin vitamin C and zinc    #2 hypothyroidism continue  Synthroid  #3 hypokalemia repleted  #4 remote history of breast cancer appreciate Dr. Virgie Dad note.    Nutrition Problem: Increased nutrient needs Etiology: (COPD)    Signs/Symptoms: estimated needs     Interventions: Ensure Enlive (each supplement provides 350kcal and 20 grams of protein)  Estimated body mass index is 22.92 kg/m as calculated from the following:   Height as of this encounter: 5\' 6"  (1.676 m).   Weight as of this encounter: 64.4 kg.  Discharge Instructions  Discharge Instructions    Call MD for:  difficulty breathing, headache or visual disturbances   Complete by:  As directed    Call MD for:  persistant nausea and vomiting   Complete by:  As directed    Call MD for:  severe uncontrolled pain   Complete by:  As directed    Diet - low sodium heart healthy   Complete by:  As directed    Increase activity slowly   Complete by:  As directed      Allergies as of 10/29/2018      Reactions   Prednisone Shortness Of Breath, Swelling   CAN TOLERATE IF GIVEN BENADRYL PRIOR       Medication List    STOP taking these medications   doxycycline 100 MG tablet Commonly known as:  ADOXA     TAKE these medications   acetaminophen 325 MG tablet Commonly known as:  TYLENOL Take 2 tablets (650 mg total) by mouth every 6 (six) hours as needed for mild pain or headache (fever >/= 101).   ascorbic acid 500 MG tablet Commonly known as:  VITAMIN C Take 1 tablet (500 mg total) by mouth daily.   chlorpheniramine-HYDROcodone  10-8 MG/5ML Suer Commonly known as:  TUSSIONEX Take 5 mLs by mouth 3 (three) times daily.   levothyroxine 125 MCG tablet Commonly known as:  SYNTHROID, LEVOTHROID Take 125 mcg by mouth daily before breakfast.   montelukast 10 MG tablet Commonly known as:  SINGULAIR Take 10 mg by mouth at bedtime.   ondansetron 4 MG tablet Commonly known as:  ZOFRAN Take 1 tablet (4 mg total) by mouth every 6 (six) hours as needed for nausea.    ProAir HFA 108 (90 Base) MCG/ACT inhaler Generic drug:  albuterol Inhale 2 puffs into the lungs every 4 (four) hours as needed for wheezing or shortness of breath. Reported on 07/08/2015   traMADol 50 MG tablet Commonly known as:  ULTRAM Take 1-2 tablets (50-100 mg total) by mouth every 6 (six) hours as needed. What changed:  reasons to take this   umeclidinium-vilanterol 62.5-25 MCG/INH Aepb Commonly known as:  Anoro Ellipta Inhale 1 puff into the lungs daily.   zinc sulfate 220 (50 Zn) MG capsule Take 1 capsule (220 mg total) by mouth daily.            Durable Medical Equipment  (From admission, onward)         Start     Ordered   10/29/18 0943  For home use only DME oxygen  Once    Comments:  Please evaluate and provide light weight POC-simplygo  Question Answer Comment  Mode or (Route) Nasal cannula   Liters per Minute 2   Frequency Continuous (stationary and portable oxygen unit needed)   Oxygen conserving device Yes   Oxygen delivery system Gas      10/29/18 0944         Follow-up Information    [facility] Follow up.   Why:  will provide oxygen Contact information: [address] [place] [address] [phone]          Allergies  Allergen Reactions  . Prednisone Shortness Of Breath and Swelling    CAN TOLERATE IF GIVEN BENADRYL PRIOR     Consultations: None  Procedures/Studies: Dg Chest Portable 1 View  Result Date: 10/24/2018 CLINICAL DATA:  Progressive shortness of breath. Fever. Patient's husband tested positive for COVID 2 days ago. EXAM: PORTABLE CHEST 1 VIEW COMPARISON:  Radiographs 09/28/2014. FINDINGS: Emphysema. Patchy opacities in bilateral lower lung zones, right greater than left. Heart is normal in size. No evidence of pulmonary edema, large pleural effusion or pneumothorax. The bones are under mineralized. IMPRESSION: 1. Patchy bibasilar opacities, right greater than left, suspicious for pneumonia, including  atypical/viral infection. 2. Emphysema. Electronically Signed   By: Keith Rake M.D.   On: 10/24/2018 03:03    (Echo, Carotid, EGD, Colonoscopy, ERCP)    Subjective: Resting in bed occasional cough and nausea reported since husband is in the hospital she stays at home alone and she is willing and able to quarantine herself at home.  Discharge Exam: Vitals:   10/28/18 2022 10/29/18 0515  BP: 101/62 102/67  Pulse: 76 74  Resp: 18 18  Temp: 97.9 F (36.6 C) 98.4 F (36.9 C)  SpO2: 96% 97%   Vitals:   10/28/18 1430 10/28/18 1444 10/28/18 2022 10/29/18 0515  BP:  103/66 101/62 102/67  Pulse:  80 76 74  Resp:  20 18 18   Temp:  98 F (36.7 C) 97.9 F (36.6 C) 98.4 F (36.9 C)  TempSrc:  Oral Oral   SpO2: 96% 93% 96% 97%  Weight:  Height:        General: Pt is alert, awake, not in acute distress Cardiovascular: RRR, S1/S2 +, no rubs, no gallops Respiratory: Few scattered rhonchi bilaterally, no wheezing, no rhonchi Abdominal: Soft, NT, ND, bowel sounds + Extremities: no edema, no cyanosis    The results of significant diagnostics from this hospitalization (including imaging, microbiology, ancillary and laboratory) are listed below for reference.     Microbiology: Recent Results (from the past 240 hour(s))  Blood culture (routine x 2)     Status: None   Collection Time: 10/24/18  2:59 AM  Result Value Ref Range Status   Specimen Description   Final    BLOOD RIGHT HAND Performed at [facility] [address], [place], [place] [address]    Special Requests   Final    BOTTLES DRAWN AEROBIC AND ANAEROBIC Blood Culture results may not be optimal due to an excessive volume of blood received in culture bottles Performed at [facility] [address] [place], [place] [address]    Culture   Final    NO GROWTH 5 DAYS Performed at [facility], [place] [address] [place], [place] [address]    Report Status 10/29/2018  FINAL  Final  Blood culture (routine x 2)     Status: None   Collection Time: 10/24/18  2:59 AM  Result Value Ref Range Status   Specimen Description   Final    BLOOD RIGHT FOREARM Performed at [facility] [address], [place], [place] [address]    Special Requests   Final    BOTTLES DRAWN AEROBIC AND ANAEROBIC Blood Culture results may not be optimal due to an excessive volume of blood received in culture bottles Performed at [facility] [address] [place],  [address]    Culture   Final    NO GROWTH 5 DAYS Performed at [facility], [place] [address], [place],  [address]    Report Status 10/29/2018 FINAL  Final  Novel Coronavirus, NAA (hospital order; send-out to ref lab)     Status: Abnormal   Collection Time: 10/24/18  5:59 AM  Result Value Ref Range Status   SARS-CoV-2, NAA DETECTED (A) NOT DETECTED Final    Comment: Positive (Detected) results are indicative of active infection with SARS CoV 2. A positive result does not rule out bacterial infection or coinfection with other viruses. Detection of SARS CoV 2 viral RNA may not indicate that SARS CoV 2 is the causative agent for clinical symptoms. Positive and negative predictive values of testing are highly dependent on prevalence. False positive test results are more likely when prevalence is moderate to low. CRITICAL RESULT CALLED TO, READ BACK BY AND VERIFIED WITH: Kittie Plater RN 0160 10/26/18 A BROWNING (NOTE) The expected result is Negative (Not Detected). The SARS CoV 2 test is intended for the presumptive qualitative  detection of nucleic acid from SARS CoV 2 in upper and lower  respiratory specimens. Testing methodology is real time RT PCR. Test results must be correlated with clinical presentation and  evaluated in the context of other laboratory and epidemiologic data.  Test performance can be affected because the epidemiology and   clinical spectrum of  infection caused by SARS CoV 2 is not fully  known. For example, the optimum types of specimens to collect and  when during the course of infection these specimens are most likely  to contain detectable viral RNA  may not be known. This test has not been Food and Drug Administration (FDA) cleared or  approved and has been authorized by FDA under an Emergency Use  Authorization (EUA). The test is only authorized for the duration of  the declaration that circumstances exist justifying the authorization  of emergency use of in vitro diagnostic tests for detection and or  diagnosis of SARS CoV 2 under Section 564(b)(1) of the Act, 21 U.S.C.  section [phone] 3(b)(1), unless the authorization is terminated or  revoked sooner. [facility] is certified under the  Clinical Laboratory Improvement Amendments of 1988 (CLIA), 42 U.S.C.  section [phone], to perform high complexity tests. Performed at [facility] [id number], [place], [place] [address] Laboratory Director: Loleta Books, MD Fact Sheet for Healthcare Providers  BankingDealers.co.za Fact Sheet for Patients  StrictlyIdeas.no Performed at [facility], [place] [address], [place], [place] [address]    Coronavirus Source NASOPHARYNGEAL  Final    Comment: Performed at [facility], [place] [address] [place], [place] [address]  Respiratory Panel by PCR     Status: None   Collection Time: 10/24/18  5:59 AM  Result Value Ref Range Status   Adenovirus NOT DETECTED NOT DETECTED Final   Coronavirus 229E NOT DETECTED NOT DETECTED Final    Comment: (NOTE) The Coronavirus on the Respiratory Panel, DOES NOT test for the novel  Coronavirus (2019 nCoV)    Coronavirus HKU1 NOT DETECTED NOT DETECTED Final   Coronavirus NL63 NOT DETECTED NOT DETECTED Final   Coronavirus OC43 NOT DETECTED NOT DETECTED Final    Metapneumovirus NOT DETECTED NOT DETECTED Final   Rhinovirus / Enterovirus NOT DETECTED NOT DETECTED Final   Influenza A NOT DETECTED NOT DETECTED Final   Influenza B NOT DETECTED NOT DETECTED Final   Parainfluenza Virus 1 NOT DETECTED NOT DETECTED Final   Parainfluenza Virus 2 NOT DETECTED NOT DETECTED Final   Parainfluenza Virus 3 NOT DETECTED NOT DETECTED Final   Parainfluenza Virus 4 NOT DETECTED NOT DETECTED Final   Respiratory Syncytial Virus NOT DETECTED NOT DETECTED Final   Bordetella pertussis NOT DETECTED NOT DETECTED Final   Chlamydophila pneumoniae NOT DETECTED NOT DETECTED Final   Mycoplasma pneumoniae NOT DETECTED NOT DETECTED Final    Comment: Performed at [facility], [place]. [address], [place], [place] [address]     Labs: BNP (last 3 results) No results for input(s): BNP in the last 8760 hours. Basic Metabolic Panel: Recent Labs  Lab 10/25/18 0341 10/26/18 0442 10/27/18 0433 10/28/18 0426 10/29/18 0449  NA 139 141 138 140 140  K 3.6 3.6 4.6 3.7 4.4  CL 113* 117* 108 107 106  CO2 19* 20* 24 24 28   GLUCOSE 83 85 87 83 88  BUN 11 8 6 7 8   CREATININE 0.75 0.62 0.60 0.55 0.65  CALCIUM 7.8* 7.8* 8.3* 8.4* 8.7*  MG 2.0  --  2.1  --   --    Liver Function Tests: Recent Labs  Lab 10/25/18 0341 10/26/18 0442 10/27/18 0433 10/28/18 0426 10/29/18 0449  AST 26 24 22 28  52*  ALT 16 17 18 21  37  ALKPHOS 59 61 66 69 75  BILITOT 0.3 0.5 0.3 0.5 0.5  PROT 5.2* 5.3* 5.7* 5.9* 6.3*  ALBUMIN 2.7* 2.7* 2.6* 2.8* 3.1*   No results for input(s): LIPASE, AMYLASE in the last 168 hours. No results for input(s): AMMONIA in the last  168 hours. CBC: Recent Labs  Lab 10/25/18 0341 10/26/18 0442 10/27/18 0433 10/28/18 0426 10/29/18 0449  WBC 4.2 3.4* 3.1* 3.2* 4.0  NEUTROABS 3.2 2.6 1.8 1.9 2.7  HGB 9.1* 8.5* 9.0* 9.9* 10.7*  HCT 27.0* 26.2* 26.8* 29.0* 31.3*  MCV 98.2 98.1 97.5 93.2 94.6  PLT 158 169 193 243 283   Cardiac Enzymes: No results for input(s):  CKTOTAL, CKMB, CKMBINDEX, TROPONINI in the last 168 hours. BNP: Invalid input(s): POCBNP CBG: No results for input(s): GLUCAP in the last 168 hours. D-Dimer No results for input(s): DDIMER in the last 72 hours. Hgb A1c No results for input(s): HGBA1C in the last 72 hours. Lipid Profile No results for input(s): CHOL, HDL, LDLCALC, TRIG, CHOLHDL, LDLDIRECT in the last 72 hours. Thyroid function studies No results for input(s): TSH, T4TOTAL, T3FREE, THYROIDAB in the last 72 hours.  Invalid input(s): FREET3 Anemia work up No results for input(s): VITAMINB12, FOLATE, FERRITIN, TIBC, IRON, RETICCTPCT in the last 72 hours. Urinalysis    Component Value Date/Time   COLORURINE YELLOW 02/18/2015 0510   APPEARANCEUR CLOUDY (A) 02/18/2015 0510   LABSPEC 1.018 02/18/2015 0510   LABSPEC 1.010 07/10/2014 1004   PHURINE 5.0 02/18/2015 0510   GLUCOSEU NEGATIVE 02/18/2015 0510   GLUCOSEU Negative 07/10/2014 1004   HGBUR SMALL (A) 02/18/2015 0510   BILIRUBINUR NEGATIVE 02/18/2015 0510   BILIRUBINUR Negative 07/10/2014 1004   KETONESUR NEGATIVE 02/18/2015 0510   PROTEINUR NEGATIVE 02/18/2015 0510   UROBILINOGEN 0.2 02/18/2015 0510   UROBILINOGEN 0.2 07/10/2014 1004   NITRITE NEGATIVE 02/18/2015 0510   LEUKOCYTESUR MODERATE (A) 02/18/2015 0510   LEUKOCYTESUR Negative 07/10/2014 1004   Sepsis Labs Invalid input(s): PROCALCITONIN,  WBC,  LACTICIDVEN Microbiology Recent Results (from the past 240 hour(s))  Blood culture (routine x 2)     Status: None   Collection Time: 10/24/18  2:59 AM  Result Value Ref Range Status   Specimen Description   Final    BLOOD RIGHT HAND Performed at [facility], [place] [address] [place], [place] [address]    Special Requests   Final    BOTTLES DRAWN AEROBIC AND ANAEROBIC Blood Culture results may not be optimal due to an excessive volume of blood received in culture bottles Performed at [facility] [address]  [place], [place] [address]    Culture   Final    NO GROWTH 5 DAYS Performed at [facility], [place] [address] [place], [place] [address]    Report Status 10/29/2018 FINAL  Final  Blood culture (routine x 2)     Status: None   Collection Time: 10/24/18  2:59 AM  Result Value Ref Range Status   Specimen Description   Final    BLOOD RIGHT FOREARM Performed at [facility] [address], [place], [place] [address]    Special Requests   Final    BOTTLES DRAWN AEROBIC AND ANAEROBIC Blood Culture results may not be optimal due to an excessive volume of blood received in culture bottles Performed at [facility] [address], [place],  [address]    Culture   Final    NO GROWTH 5 DAYS Performed at [facility], [place] [address] [place],  [address]    Report Status 10/29/2018 FINAL  Final  Novel Coronavirus, NAA (hospital order; send-out to ref lab)     Status: Abnormal   Collection Time: 10/24/18  5:59 AM  Result Value Ref  Range Status   SARS-CoV-2, NAA DETECTED (A) NOT DETECTED Final    Comment: Positive (Detected) results are indicative of active infection with SARS CoV 2. A positive result does not rule out bacterial infection or coinfection with other viruses. Detection of SARS CoV 2 viral RNA may not indicate that SARS CoV 2 is the causative agent for clinical symptoms. Positive and negative predictive values of testing are highly dependent on prevalence. False positive test results are more likely when prevalence is moderate to low. CRITICAL RESULT CALLED TO, READ BACK BY AND VERIFIED WITH: Kittie Plater RN 1610 10/26/18 A BROWNING (NOTE) The expected result is Negative (Not Detected). The SARS CoV 2 test is intended for the presumptive qualitative  detection of nucleic acid from SARS CoV 2 in upper and lower  respiratory specimens. Testing methodology is real time RT PCR. Test results must be correlated with clinical  presentation and  evaluated in the context of other laboratory and epidemiologic data.  Test performance can be affected because the epidemiology and   clinical spectrum of infection caused by SARS CoV 2 is not fully  known. For example, the optimum types of specimens to collect and  when during the course of infection these specimens are most likely  to contain detectable viral RNA may not be known. This test has not been Food and Drug Administration (FDA) cleared or  approved and has been authorized by FDA under an Emergency Use  Authorization (EUA). The test is only authorized for the duration of  the declaration that circumstances exist justifying the authorization  of emergency use of in vitro diagnostic tests for detection and or  diagnosis of SARS CoV 2 under Section 564(b)(1) of the Act, 21 U.S.C.  section [phone] 3(b)(1), unless the authorization is terminated or  revoked sooner. [facility] is certified under the  Clinical Laboratory Improvement Amendments of 1988 (CLIA), 42 U.S.C.  section [phone], to perform high complexity tests. Performed at [facility] [id number] [place], [place], [place] [address] Laboratory Director: Loleta Books, MD Fact Sheet for Healthcare Providers  BankingDealers.co.za Fact Sheet for Patients  StrictlyIdeas.no Performed at [facility], [place] [address], [place], [place] [address]    Coronavirus Source NASOPHARYNGEAL  Final    Comment: Performed at [facility], [place] [address] [place], [place] [address]  Respiratory Panel by PCR     Status: None   Collection Time: 10/24/18  5:59 AM  Result Value Ref Range Status   Adenovirus NOT DETECTED NOT DETECTED Final   Coronavirus 229E NOT DETECTED NOT DETECTED Final    Comment: (NOTE) The Coronavirus on the Respiratory Panel, DOES NOT test for the novel  Coronavirus  (2019 nCoV)    Coronavirus HKU1 NOT DETECTED NOT DETECTED Final   Coronavirus NL63 NOT DETECTED NOT DETECTED Final   Coronavirus OC43 NOT DETECTED NOT DETECTED Final   Metapneumovirus NOT DETECTED NOT DETECTED Final   Rhinovirus / Enterovirus NOT DETECTED NOT DETECTED Final   Influenza A NOT DETECTED NOT DETECTED Final   Influenza B NOT DETECTED NOT DETECTED Final   Parainfluenza Virus 1 NOT DETECTED NOT DETECTED Final   Parainfluenza Virus 2 NOT DETECTED NOT DETECTED Final   Parainfluenza Virus 3 NOT DETECTED NOT DETECTED Final   Parainfluenza Virus 4 NOT DETECTED NOT DETECTED Final   Respiratory Syncytial Virus NOT DETECTED NOT DETECTED Final   Bordetella pertussis NOT DETECTED NOT DETECTED Final  Chlamydophila pneumoniae NOT DETECTED NOT DETECTED Final   Mycoplasma pneumoniae NOT DETECTED NOT DETECTED Final    Comment: Performed at [facility], [place] [address] [place], [place] [address]     Time coordinating discharge: 34 minutes  SIGNED:   Georgette Shell, MD  [facility] 10/29/2018, 10:14 AM   If 7PM-7AM, please contact night-coverage [email or web address] Password [email or web address]

## 2018-11-03 DIAGNOSIS — J189 Pneumonia, unspecified organism: Secondary | ICD-10-CM | POA: Diagnosis not present

## 2018-11-03 DIAGNOSIS — J449 Chronic obstructive pulmonary disease, unspecified: Secondary | ICD-10-CM | POA: Diagnosis not present

## 2018-11-03 DIAGNOSIS — Z9981 Dependence on supplemental oxygen: Secondary | ICD-10-CM | POA: Diagnosis not present

## 2018-11-21 DIAGNOSIS — Z20828 Contact with and (suspected) exposure to other viral communicable diseases: Secondary | ICD-10-CM | POA: Diagnosis not present

## 2018-11-21 DIAGNOSIS — U071 COVID-19: Secondary | ICD-10-CM | POA: Diagnosis not present

## 2018-11-21 DIAGNOSIS — R0602 Shortness of breath: Secondary | ICD-10-CM | POA: Diagnosis not present

## 2018-12-05 DIAGNOSIS — U071 COVID-19: Secondary | ICD-10-CM | POA: Diagnosis not present

## 2019-01-05 ENCOUNTER — Other Ambulatory Visit: Payer: Self-pay

## 2019-01-05 ENCOUNTER — Encounter: Payer: Self-pay | Admitting: Cardiovascular Disease

## 2019-01-05 ENCOUNTER — Telehealth: Payer: Self-pay | Admitting: *Deleted

## 2019-01-05 ENCOUNTER — Telehealth (INDEPENDENT_AMBULATORY_CARE_PROVIDER_SITE_OTHER): Payer: BC Managed Care – PPO | Admitting: Cardiovascular Disease

## 2019-01-05 ENCOUNTER — Telehealth: Payer: BLUE CROSS/BLUE SHIELD | Admitting: Cardiovascular Disease

## 2019-01-05 VITALS — Ht 66.5 in | Wt 148.0 lb

## 2019-01-05 DIAGNOSIS — R002 Palpitations: Secondary | ICD-10-CM | POA: Diagnosis not present

## 2019-01-05 DIAGNOSIS — R0602 Shortness of breath: Secondary | ICD-10-CM

## 2019-01-05 DIAGNOSIS — R0789 Other chest pain: Secondary | ICD-10-CM

## 2019-01-05 DIAGNOSIS — Z720 Tobacco use: Secondary | ICD-10-CM

## 2019-01-05 DIAGNOSIS — E039 Hypothyroidism, unspecified: Secondary | ICD-10-CM

## 2019-01-05 DIAGNOSIS — Z7189 Other specified counseling: Secondary | ICD-10-CM

## 2019-01-05 DIAGNOSIS — I2510 Atherosclerotic heart disease of native coronary artery without angina pectoris: Secondary | ICD-10-CM

## 2019-01-05 DIAGNOSIS — Z79899 Other long term (current) drug therapy: Secondary | ICD-10-CM

## 2019-01-05 MED ORDER — ASPIRIN EC 81 MG PO TBEC: 81.0000 mg | DELAYED_RELEASE_TABLET | Freq: Every day | ORAL | 3 refills | Status: AC

## 2019-01-05 MED ORDER — METOPROLOL TARTRATE 25 MG PO TABS: 12.5000 mg | ORAL_TABLET | Freq: Two times a day (BID) | ORAL | 1 refills | Status: DC

## 2019-01-05 NOTE — Progress Notes (Signed)
Virtual Visit via Telephone Note   This visit type was conducted due to national recommendations for restrictions regarding the COVID-19 Pandemic (e.g. social distancing) in an effort to limit this patient's exposure and mitigate transmission in our community.  Due to her co-morbid illnesses, this patient is at least at moderate risk for complications without adequate follow up.  This format is felt to be most appropriate for this patient at this time.  The patient did not have access to video technology/had technical difficulties with video requiring transitioning to audio format only (telephone).  All issues noted in this document were discussed and addressed.  No physical exam could be performed with this format.  Please refer to the patient's chart for her  consent to telehealth for [facility].   Date:  01/05/2019   ID:  Stacy Fernandez, DOB 1960/09/22, MRN [id number]  Patient Location: Home Provider Location: Home  PCP:  Jettie Booze, NP  Cardiologist:  Shelva Majestic, MD Electrophysiologist:  None   Evaluation Performed: New cardiology evaluation.  Patient seen by me in 2014 but has not been seen since.  Chief Complaint: Status post COVID-19, recent palpitations and increasing shortness of breath  History of Present Illness:    Stacy Fernandez is a 58 y.o. female who I had seen in 2014 for evaluation of chest pain.  She has a history of tobacco use, migraine headaches as well as COPD/emphysema.  In October 2013 a nuclear perfusion study showed normal perfusion and function.  She also had a history of GERD with reflux and had undergone esophageal manometry.  She developed chest pain leading to a [facility] long hospital presentation.  She underwent diagnostic cardiac catheterization by Dr. Alvester Chou on December 25, 2012 which showed 40% ostial first diagonal branch stenosis of the LAD; all other vessels were normal.  EF was 60%.She had continued to smoke cigarettes and I saw her in follow-up  of her hospitalization in September 2014.  She also has a history of a history of breast CA and is followed by Dr. Jana Hakim.  Apparently earlier this year both she and her husband develop symptoms of COVID-19.  They believe that this may have been acquired in their church since 19 members tested positive and to subsequently died.  Her husband developed COVID initially and was hospitalized at [facility] long for 3 weeks.  She was admitted to [facility] due to her COVID in early April and was hospitalized for 1 week.  The day of discharge is when the [place] open for COVID patients.  Subsequent testing 2 weeks later still revealed her to be COVID positive and it was not until a May test that she tested COVID negative.  Over the past 2 to 4 weeks, she has noticed increasing episodes of palpitations where her heart speeds up rapidly.  She also admits to some discomfort in her chest where she feels a tightness.  When she does housework her heart races.  At times she thinks it may be irregular.  She admits to shortness of breath with activity.  She has been evaluated by her primary provider now presents for cardiology evaluation.  The patient currently does not have a symptoms of COVID-19 but apparently has been documented following her infection to have  strong antibody positivity.   Past Medical History:  Diagnosis Date  . Anxiety   . 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 Chemotherapy-induced peripheral neuropathy (HCC)    FEET  . 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 CKD (chronic kidney disease), stage III ([place])   . COPD with emphysema [facility]) pulmologist--  dr Elsworth Soho   advanded emphysema per CT   . History of chemotherapy    left breast ca--  05-09-2015   to 04-30-2015  . History of gastroesophageal reflux (GERD)    no problems since Nissen fundoplication  . Hypothyroidism   . Rectovaginal fistula   . Skin changes related to chemotherapy    ARMS  . Supraclinoid carotid artery aneurysm, small neurologist-  dr willis/  dr Krista Blue   left internal - 2 mm;  aneurysm  vs.  infundibulum--  residual chronic migraine  . Wears dentures    upper  . Wears glasses    Past Surgical History:  Procedure Laterality Date  . APPENDECTOMY  1992  . BREAST RECONSTRUCTION WITH PLACEMENT OF TISSUE EXPANDER AND FLEX HD (ACELLULAR HYDRATED DERMIS) Bilateral 09/25/2014   Procedure: PLACEMENT OF BILATERAL TISSUE EXPANDER AND  ACELLULAR DERMIS FOR BREAST RECONSTRUCTION ;  Surgeon: [name], MD;  Location: [place];  Service: Plastics;  Laterality: Bilateral;  . CHOLECYSTECTOMY  1999  . ESOPHAGOGASTRODUODENOSCOPY (EGD) WITH PROPOFOL  01/18/2013  . LAPAROSCOPIC NISSEN FUNDOPLICATION  [id number]  . LEFT HEART CATHETERIZATION WITH CORONARY ANGIOGRAM N/A 12/26/2012   Procedure: LEFT HEART CATHETERIZATION WITH CORONARY ANGIOGRAM;  Surgeon: Lorretta Harp, MD;  Location: [facility] CATH LAB;  Service: Cardiovascular;  Laterality: N/A;   Nonobstructive CAD/  40-50% ostial D1/  normal LVF, ef 60%  . LIPOSUCTION WITH LIPOFILLING Bilateral 12/18/2014   Procedure: LIPOFILLING TO BILATERAL CHEST;  Surgeon: [name], MD;  Location: [place];  Service: Plastics;  Laterality: Bilateral;  . PORT-A-CATH REMOVAL Right 06/28/2015   Procedure: REMOVAL PORT-A-CATH;  Surgeon: Leighton Ruff, MD;  Location: [facility];  Service: General;  Laterality: Right;  . PORTACATH PLACEMENT N/A 04/02/2014   Procedure: INSERTION PORT-A-CATH;  Surgeon: [name], MD;  Location: [place];  Service: General;  Laterality: N/A;  . REMOVAL OF BILATERAL TISSUE EXPANDERS WITH PLACEMENT OF BILATERAL BREAST IMPLANTS Bilateral 12/18/2014   Procedure:  REMOVAL OF BILATERAL TISSUE EXPANDERS,PLACEMENT SILICONE IMPLANTS ;  Surgeon: [name], MD;  Location: [place];  Service: Plastics;  Laterality: Bilateral;  . SIMPLE MASTECTOMY WITH AXILLARY SENTINEL NODE BIOPSY Bilateral 04/02/2014   Procedure: LEFT TOTAL MASTECTOMY WITH LEFT AXILLARY SENTINEL NODE BIOPSY, RIGHT PROPHYLACTIC MASTETCTOMY;  Surgeon: [name], MD;  Location: [place];  Service: General;  Laterality: Bilateral;  . TOTAL ABDOMINAL HYSTERECTOMY W/ BILATERAL SALPINGOOPHORECTOMY  1996  . TRANSTHORACIC ECHOCARDIOGRAM  02-04-2015   grade 1 diastolic dysfunction, ef 63-84%/  trivial TR  . VESICO-VAGINAL FISTULA REPAIR N/A 07/17/2015   Procedure: REPAIR OF RECTOVAGINAL FISTULA;  Surgeon: [name], MD;  Location: [facility];  Service: Gynecology;  Laterality: N/A;  . VIDEO BRONCHOSCOPY Bilateral 01/16/2013   Procedure: VIDEO BRONCHOSCOPY WITHOUT FLUORO;  Surgeon: [name], MD;  Location: [facility];  Service: Cardiopulmonary;  Laterality: Bilateral;     Current Meds  Medication Sig  . acetaminophen (TYLENOL) 325 MG tablet Take 2 tablets (650 mg total) by mouth every 6 (six) hours as needed for mild pain or headache (  fever >/= 101).  . chlorpheniramine-HYDROcodone (TUSSIONEX) 10-8 MG/5ML SUER Take 5 mLs by mouth 3 (three) times daily.  Marland Kitchen levothyroxine (SYNTHROID, LEVOTHROID) 125 MCG tablet Take 125 mcg by mouth daily before breakfast.  . montelukast (SINGULAIR) 10 MG tablet Take 10 mg by mouth at bedtime.   . ondansetron (ZOFRAN) 4 MG tablet Take 1 tablet (4 mg total) by mouth every 6 (six) hours as needed for nausea.  Marland Kitchen PROAIR HFA 108 (90 BASE) MCG/ACT inhaler Inhale 2 puffs into the lungs every 4 (four) hours as needed for wheezing or shortness of breath. Reported on 07/08/2015  . umeclidinium-vilanterol (ANORO ELLIPTA) 62.5-25 MCG/INH AEPB Inhale 1 puff into the lungs daily.  . vitamin C (VITAMIN C) 500 MG tablet Take 1 tablet (500 mg total) by mouth  daily.  Marland Kitchen zinc sulfate 220 (50 Zn) MG capsule Take 1 capsule (220 mg total) by mouth daily.  . [DISCONTINUED] traMADol (ULTRAM) 50 MG tablet Take 1-2 tablets (50-100 mg total) by mouth every 6 (six) hours as needed. (Patient taking differently: Take 50-100 mg by mouth every 6 (six) hours as needed for moderate pain or severe pain. )     Allergies:   Prednisone   Social History   Tobacco Use  . Smoking status: Current Every Day Smoker    Packs/day: 0.00    Years: 37.00    Pack years: 0.00    Types: Cigarettes, E-cigarettes    Last attempt to quit: 03/29/2014    Years since quitting: 4.7  . Smokeless tobacco: Never Used  . Tobacco comment: 5-7 cigs daily 06/07/17  Substance Use Topics  . Alcohol use: No  . Drug use: No    Her husband is 83rd who is also 1 of my patients.  He developed COVID and required 3 weeks in the hospital and was in the intensive care unit but fortunately did not have to get intubated.  The patient has a longstanding tobacco history and had quit smoking but has resumed some cigarette use.   Family Hx: The patient's family history includes Breast cancer (age of onset: 26) in her paternal aunt; Cancer in her paternal grandfather; Colon cancer (age of onset: 30) in her paternal grandmother; Colon cancer (age of onset: 70) in her father; Heart attack in her maternal grandmother; Heart failure in her mother; Ovarian cancer (age of onset: 70) in her sister; Ovarian cancer (age of onset: 25) in an other family member; Throat cancer (age of onset: 50) in her brother; Throat cancer (age of onset: 7) in her brother.  ROS:   General: Negative; No fevers, chills, or night sweats;  HEENT: Negative; No changes in vision or hearing, sinus congestion, difficulty swallowing Pulmonary: Status post COVID-19 infection with patchy bibasilar infiltrates noted on chest x-ray along with emphysematous changes Cardiovascular: see HPI GI: Negative; No nausea, vomiting, diarrhea, or  abdominal pain GU: Negative; No dysuria, hematuria, or difficulty voiding Musculoskeletal: Negative; no myalgias, joint pain, or weakness Hematologic/Oncology: Positive for breast CA, HER2 pos, ER neg; no bleeding Endocrine: Positive for hypothyroidism; no diabetes Neuro: Negative; no changes in balance, headaches Skin: Negative; No rashes or skin lesions Psychiatric: Positive for anxiety  Sleep: Negative; No snoring, daytime sleepiness, hypersomnolence, bruxism, restless legs, hypnogognic hallucinations, no cataplexy Other comprehensive 14 point system review is negative. All other systems reviewed and are negative.   Prior CV studies:   The following studies were reviewed today:   ECHO 02/04/2015 Study Conclusions  - Left ventricle: The cavity size was  normal. Systolic function was   normal. The estimated ejection fraction was in the range of 50%   to 55%. Wall motion was normal; there were no regional wall   motion abnormalities. Doppler parameters are consistent with   abnormal left ventricular relaxation (grade 1 diastolic   dysfunction). There was no evidence of elevated ventricular   filling pressure by Doppler parameters. - Aortic valve: Trileaflet; normal thickness leaflets.   Transvalvular velocity was within the normal range. There was no   stenosis. There was no regurgitation. - Aortic root: The aortic root was normal in size. - Mitral valve: Structurally normal valve. There was no   regurgitation. - Left atrium: The atrium was normal in size. - Right ventricle: The cavity size was normal. Wall thickness was   normal. Systolic function was normal. - Right atrium: The atrium was normal in size. - Tricuspid valve: There was trivial regurgitation. - Pulmonary arteries: Systolic pressure was within the normal   range. - Inferior vena cava: The vessel was normal in size. - Pericardium, extracardiac: There was no pericardial effusion.  Impressions:  - There is no  significant change when compared to the prior study   from 10/29/2014, global longitudinal strain remain mildly abnormal   - 15.1 %.   Lateral S prime is normal 10 cm/sec.  Labs/Other Tests and Data Reviewed:    EKG:  An ECG dated 10/29/2018 was personally reviewed today and demonstrated:  Normal sinus rhythm at 74, QTc interval 444 ms  Recent Labs: 10/27/2018: Magnesium 2.1 10/29/2018: ALT 37; BUN 8; Creatinine, Ser 0.65; Hemoglobin 10.7; Platelets 283; Potassium 4.4; Sodium 140   Recent Lipid Panel Lab Results  Component Value Date/Time   CHOL 149 12/26/2012 03:40 AM   TRIG 86 12/26/2012 03:40 AM   HDL 34 (L) 12/26/2012 03:40 AM   CHOLHDL 4.4 12/26/2012 03:40 AM   LDLCALC 98 12/26/2012 03:40 AM    Wt Readings from Last 3 Encounters:  01/05/19 148 lb (67.1 kg)  10/24/18 142 lb (64.4 kg)  07/29/18 151 lb (68.5 kg)     Objective:    Vital Signs:  Ht 5' 6.5" (1.689 m)   Wt 148 lb (67.1 kg)   BMI 23.53 kg/m    This was a phone visit and I could not visibly see the patient.  She had presented to the office but apparently was not seen and was told to have a virtual visit with me.  She apparently was in the car at the office doing this virtual examination  She denied any major change in physical appearance recently Her breathing was nonlabored There was no audible wheezing She states that she has noticed recurrent episodes of her heart racing, currently heart rate was in the 80s She denied any chest wall tenderness when she palpated her chest.  However she admits to sometimes chest tightness particularly when her heart is racing She denied abdominal tenderness to palpation There was no leg swelling She denied any tremors She denied any neurologic issues She has had issues with anxiety coincident with her COVID-19 infection and subsequent shortness of breath and palpitation episodes  ASSESSMENT & PLAN:    1. Status post COVID-19 requiring 1 week hospitalization.  No intubation.   She received hydrochloroquin for 5 days.  She remained Covid positive for at least 2 weeks after her hospitalization and ultimately transitioned to COVID negative with a strong antibody response 2. Palpitations: She states her levothyroxine dose had been reduced from 175 mcg to presently  125 mcg.  She developed a reduced dose.  I have recommended that she wear an event monitor for 2 weeks.  I am starting her on metoprolol tartrate initially at 12.5 mg twice a day.  Since I do not know her blood pressure presently I would not initiate at a higher dose to I am certain that her blood pressure can tolerate.  She has been drinking 1 to 2 cups of coffee per day and discontinuance of caffeine was recommended 3. CAD at prior catheterization with recent chest discomfort: She had previously undergone cardiac catheterization in 2014 and had 40% stenosis in her diagonal branch of her LAD.  She has a longstanding tobacco history.  I am recommending she undergo CT coronary angiogram for further evaluation of her chest tightness to assess for progressive CAD.  I have recommended baby aspirin 81 mg. 4. Shortness of breath: A 2D echo Doppler study will be done to assess systolic and diastolic function as well as valvular architecture.  She will be undergoing evaluation for CAD progression as well as follow-up chest x-ray. 5. Abnormal chest x-ray: With her shortness of breath, and prior patchy bilateral infiltrative disease in the setting of her COVID-19 infection, I have recommended follow-up PA lateral chest x-ray to document improvement. 6. Longstanding tobacco use: Definitive smoking cessation is essential. 7. Hypothyroidism: We will recheck laboratory on her reduced dose.  COVID-19 Education: The signs and symptoms of COVID-19 were discussed with the patient and how to seek care for testing (follow up with PCP or arrange E-visit).  The importance of social distancing was discussed today.  Time:   Today, I have spent  38 minutes with the patient with telehealth technology discussing the above problems.     Medication Adjustments/Labs and Tests Ordered: Current medicines are reviewed at length with the patient today.  Concerns regarding medicines are outlined above.   Tests Ordered: No orders of the defined types were placed in this encounter.   Medication Changes: No orders of the defined types were placed in this encounter.   Follow Up:  In Person 4-6 weeks  Signed, Shelva Majestic, MD  01/05/2019 12:06 PM    [place]

## 2019-01-05 NOTE — Patient Instructions (Addendum)
Medication Instructions:  Start Metoprolol Tartrate 12.5 twice daily. (may use another dose if palpitations occur) Start baby Aspirin 81 mg daily.   If you need a refill on your cardiac medications before your next appointment, please call your pharmacy.   Lab work: Fasting lab work (CBC, CMET, TSH, LIPID, MAG)  Attached are the lab orders that are needed before your upcoming appointment, please come in anytime to have your labs drawn.   They are fasting labs, so nothing to eat or drink after midnight.  Lab hours: 8:00-4:00 lunch hours 12:45-1:45   If you have labs (blood work) drawn today and your tests are completely normal, you will receive your results only by: Marland Kitchen MyChart Message (if you have MyChart) OR . A paper copy in the mail If you have any lab test that is abnormal or we need to change your treatment, we will call you to review the results.  Testing/Procedures: Your physician has requested that you have cardiac CT. Cardiac computed tomography (CT) is a painless test that uses an x-ray machine to take clear, detailed pictures of your heart. For further information please visit [facility]uge[employer]tn. Please follow instruction sheet as given.  Echocardiogram - Your physician has requested that you have an echocardiogram. Echocardiography is a painless test that uses sound waves to create images of your heart. It provides your doctor with information about the size and shape of your heart and how well your heart's chambers and valves are working. This procedure takes approximately one hour. There are no restrictions for this procedure. This will be performed at our [facility] location - [address].  A chest x-ray takes a picture of the organs and structures inside the chest, including the heart, lungs, and blood vessels. This test can show several things, including, whether the heart is enlarges; whether fluid is building up in the lungs; and whether pacemaker /  defibrillator leads are still in place.  Your physician has recommended that you wear an event monitor-2 week.. This will be performed at our [facility] location - [address].  Event monitors are medical devices that record the heart's electrical activity. Doctors most often [place] these monitors to diagnose arrhythmias. Arrhythmias are problems with the speed or rhythm of the heartbeat. The monitor is a small, portable evice. You can wear one while you do your normal daily activities. This is usually used to diagnose what is causing palpitations/syncope (passing out).    Follow-Up: At [facility], you and your health needs are our priority.  As part of our continuing mission to provide you with exceptional heart care, we have created designated Provider Care Teams.  These Care Teams include your primary Cardiologist (physician) and Advanced Practice Providers (APPs -  Physician Assistants and Nurse Practitioners) who all work together to provide you with the care you need, when you need it. You will need a follow up appointment in 1 months(after all tests are completed).   You may see Dr.Kelly or one of the following Advanced Practice Providers on your designated Care Team: Almyra Deforest, [place] . Fabian Sharp, PA-C  Any Other Special Instructions Will Be Listed Below (If Applicable). Please arrive at the [facility] main entrance of [facility] (30-45 minutes prior to test start time)  [facility] [place], [place] [address] [phone]  Proceed to the [facility] Radiology Department (First Floor).  Please follow these instructions carefully (unless otherwise directed):  Hold all erectile dysfunction medications at least 48 hours prior to test.  On the Night Before the Test: . Be sure to Drink plenty of water. . Do not consume any caffeinated/decaffeinated beverages or chocolate 12 hours prior to your test. . Do not take any antihistamines 12  hours prior to your test. . If you take Metformin do not take 24 hours prior to test.  On the Day of the Test: . Drink plenty of water. Do not drink any water within one hour of the test. . Do not eat any food 4 hours prior to the test. . You may take your regular medications prior to the test.  . Take metoprolol (Lopressor) two hours prior to test. . HOLD Furosemide/Hydrochlorothiazide morning of the test.  After the Test: . Drink plenty of water. . After receiving IV contrast, you may experience a mild flushed feeling. This is normal. . On occasion, you may experience a mild rash up to 24 hours after the test. This is not dangerous. If this occurs, you can take Benadryl 25 mg and increase your fluid intake. . If you experience trouble breathing, this can be serious. If it is severe call 911 IMMEDIATELY. If it is mild, please call our office. . If you take any of these medications: Glipizide/Metformin, Avandament, Glucavance, please do not take 48 hours after completing test.

## 2019-01-05 NOTE — Telephone Encounter (Signed)
[employer] to ship a 14 day cardiac event monitor to the patients home.  Instructions reviewed briefly as they are included in the monitor kit. 

## 2019-01-06 ENCOUNTER — Telehealth (HOSPITAL_COMMUNITY): Payer: Self-pay | Admitting: Radiology

## 2019-01-06 NOTE — Telephone Encounter (Signed)
COVID-19 Pre-Screening Questions:  . Do you currently have a fever? NO (yes = cancel and refer to pcp for e-visit) . Have you recently travelled on a cruise, internationally, or to [place], [place], [place], [place], [place], or [place], [place] [employer]) ? NO (yes = cancel, stay home, monitor symptoms, and contact pcp or initiate e-visit if symptoms develop) . Have you been in contact with someone that is currently pending confirmation of Covid19 testing or has been confirmed to have the [place] virus?  Yes-5/20-self neg 12/05/2018 (yes = cancel, stay home, away from tested individual, monitor symptoms, and contact pcp or initiate e-visit if symptoms develop) . Are you currently experiencing fatigue or cough? NO (yes = pt should be prepared to have a mask placed at the time of their visit). . Reiterated no additional visitors. Eartha Inch no earlier than 15 minutes before appointment time. . Please bring own mask. . Patient was diagnosed with COVID 19 10/2018 tested neg 12/05/2018

## 2019-01-09 ENCOUNTER — Ambulatory Visit (HOSPITAL_COMMUNITY): Payer: BC Managed Care – PPO | Attending: Cardiovascular Disease

## 2019-01-09 ENCOUNTER — Other Ambulatory Visit: Payer: Self-pay

## 2019-01-09 DIAGNOSIS — Z79899 Other long term (current) drug therapy: Secondary | ICD-10-CM | POA: Insufficient documentation

## 2019-01-09 DIAGNOSIS — R002 Palpitations: Secondary | ICD-10-CM | POA: Insufficient documentation

## 2019-01-12 DIAGNOSIS — R000 Tachycardia, unspecified: Secondary | ICD-10-CM | POA: Diagnosis not present

## 2019-01-12 DIAGNOSIS — Z8619 Personal history of other infectious and parasitic diseases: Secondary | ICD-10-CM | POA: Diagnosis not present

## 2019-01-12 DIAGNOSIS — R002 Palpitations: Secondary | ICD-10-CM | POA: Diagnosis not present

## 2019-01-12 DIAGNOSIS — F3341 Major depressive disorder, recurrent, in partial remission: Secondary | ICD-10-CM | POA: Diagnosis not present

## 2019-01-12 DIAGNOSIS — R0602 Shortness of breath: Secondary | ICD-10-CM | POA: Diagnosis not present

## 2019-01-13 ENCOUNTER — Telehealth: Payer: Self-pay | Admitting: Cardiovascular Disease

## 2019-01-13 NOTE — Telephone Encounter (Signed)
Called patient, she has still not received the monitor- she was told should may get it by Monday or Tuesday and it was shipping on the 18th and have not seen anything and she is suppose to wear for 14 days and have a follow up on 07/22- just wanted to check on this.  Thank you!

## 2019-01-13 NOTE — Telephone Encounter (Signed)
New message:    Patient calling stating she has not yet received her monitor and would like for some one to call her concering this matter.

## 2019-01-16 ENCOUNTER — Ambulatory Visit (INDEPENDENT_AMBULATORY_CARE_PROVIDER_SITE_OTHER): Payer: BC Managed Care – PPO

## 2019-01-16 DIAGNOSIS — R002 Palpitations: Secondary | ICD-10-CM | POA: Diagnosis not present

## 2019-01-16 DIAGNOSIS — Z79899 Other long term (current) drug therapy: Secondary | ICD-10-CM

## 2019-01-16 DIAGNOSIS — R05 Cough: Secondary | ICD-10-CM | POA: Diagnosis not present

## 2019-01-16 LAB — LIPID PANEL

## 2019-01-16 NOTE — Telephone Encounter (Signed)
Patient advised to call [employer] at [phone]. Records on [employer] website specify serial # of monitor assigned.  [employer] will be able to track [place] shipping.

## 2019-01-17 LAB — COMPREHENSIVE METABOLIC PANEL
ALT: 16 IU/L (ref 0–32)
AST: 15 IU/L (ref 0–40)
Albumin/Globulin Ratio: 1.9 (ref 1.2–2.2)
Albumin: 4.4 g/dL (ref 3.8–4.9)
Alkaline Phosphatase: 131 IU/L — ABNORMAL HIGH (ref 39–117)
BUN/Creatinine Ratio: 20 (ref 9–23)
BUN: 18 mg/dL (ref 6–24)
Bilirubin Total: 0.3 mg/dL (ref 0.0–1.2)
CO2: 21 mmol/L (ref 20–29)
Calcium: 9.5 mg/dL (ref 8.7–10.2)
Chloride: 105 mmol/L (ref 96–106)
Creatinine, Ser: 0.88 mg/dL (ref 0.57–1.00)
GFR calc Af Amer: 84 mL/min/{1.73_m2} (ref >59)
GFR calc non Af Amer: 73 mL/min/{1.73_m2} (ref >59)
Globulin, Total: 2.3 g/dL (ref 1.5–4.5)
Glucose: 79 mg/dL (ref 65–99)
Potassium: 5.1 mmol/L (ref 3.5–5.2)
Sodium: 142 mmol/L (ref 134–144)
Total Protein: 6.7 g/dL (ref 6.0–8.5)

## 2019-01-17 LAB — CBC
Hematocrit: 37.6 % (ref 34.0–46.6)
Hemoglobin: 12.7 g/dL (ref 11.1–15.9)
MCH: 31.1 pg (ref 26.6–33.0)
MCHC: 33.8 g/dL (ref 31.5–35.7)
MCV: 92 fL (ref 79–97)
Platelets: 244 10*3/uL (ref 150–450)
RBC: 4.08 x10E6/uL (ref 3.77–5.28)
RDW: 13.6 % (ref 11.7–15.4)
WBC: 4.7 10*3/uL (ref 3.4–10.8)

## 2019-01-17 LAB — LIPID PANEL
Chol/HDL Ratio: 5.2 ratio — ABNORMAL HIGH (ref 0.0–4.4)
Cholesterol, Total: 202 mg/dL — ABNORMAL HIGH (ref 100–199)
HDL: 39 mg/dL — ABNORMAL LOW (ref >39)
LDL Calculated: 117 mg/dL — ABNORMAL HIGH (ref 0–99)
Triglycerides: 228 mg/dL — ABNORMAL HIGH (ref 0–149)
VLDL Cholesterol Cal: 46 mg/dL — ABNORMAL HIGH (ref 5–40)

## 2019-01-17 LAB — MAGNESIUM: Magnesium: 2.2 mg/dL (ref 1.6–2.3)

## 2019-01-17 LAB — TSH: TSH: 4.61 u[IU]/mL — ABNORMAL HIGH (ref 0.450–4.500)

## 2019-01-23 ENCOUNTER — Other Ambulatory Visit: Payer: Self-pay

## 2019-01-23 DIAGNOSIS — I2583 Coronary atherosclerosis due to lipid rich plaque: Secondary | ICD-10-CM

## 2019-01-23 DIAGNOSIS — I2510 Atherosclerotic heart disease of native coronary artery without angina pectoris: Secondary | ICD-10-CM

## 2019-01-23 DIAGNOSIS — Z79899 Other long term (current) drug therapy: Secondary | ICD-10-CM

## 2019-01-23 DIAGNOSIS — E039 Hypothyroidism, unspecified: Secondary | ICD-10-CM

## 2019-01-23 MED ORDER — ROSUVASTATIN CALCIUM 10 MG PO TABS: 10.0000 mg | ORAL_TABLET | Freq: Every day | ORAL | 3 refills | Status: DC

## 2019-01-23 NOTE — Progress Notes (Signed)
Notes recorded by Troy Sine, MD on 01/22/2019 at 8:58 PM EDT  Chemistry normal; CBC normal; lipids elevated, minimal TSH increase. With patient's history of mild CAD, consider rosuvastatin 10 mg improve diet

## 2019-01-25 ENCOUNTER — Ambulatory Visit (HOSPITAL_COMMUNITY): Payer: BC Managed Care – PPO

## 2019-02-01 ENCOUNTER — Telehealth (HOSPITAL_COMMUNITY): Payer: Self-pay | Admitting: Emergency Medicine

## 2019-02-01 NOTE — Telephone Encounter (Signed)
Reaching out to patient to offer assistance regarding upcoming cardiac imaging study; pt verbalizes understanding of appt date/time, parking situation and where to check in, pre-test NPO status and medications ordered, and verified current allergies; name and call back number provided for further questions should they arise Brinlynn Gorton RN Navigator Cardiac Imaging [facility] [phone] office [phone] cell  Pt denies covid symptoms, verbalized understanding of visitor policy. 

## 2019-02-02 ENCOUNTER — Ambulatory Visit (HOSPITAL_COMMUNITY)
Admission: RE | Admit: 2019-02-02 | Discharge: 2019-02-02 | Disposition: A | Discharge status: Home / Self Care | Payer: BC Managed Care – PPO | Source: Ambulatory Visit | Attending: Cardiovascular Disease | Admitting: Cardiovascular Disease

## 2019-02-02 ENCOUNTER — Other Ambulatory Visit (HOSPITAL_COMMUNITY): Payer: BC Managed Care – PPO

## 2019-02-02 ENCOUNTER — Ambulatory Visit (HOSPITAL_COMMUNITY): Payer: BC Managed Care – PPO

## 2019-02-02 ENCOUNTER — Other Ambulatory Visit: Payer: Self-pay

## 2019-02-02 DIAGNOSIS — R002 Palpitations: Secondary | ICD-10-CM

## 2019-02-02 DIAGNOSIS — Z79899 Other long term (current) drug therapy: Secondary | ICD-10-CM | POA: Diagnosis not present

## 2019-02-02 DIAGNOSIS — I2510 Atherosclerotic heart disease of native coronary artery without angina pectoris: Secondary | ICD-10-CM | POA: Diagnosis not present

## 2019-02-02 MED ORDER — NITROGLYCERIN 0.4 MG SL SUBL
0.8000 mg | SUBLINGUAL_TABLET | Freq: Once | SUBLINGUAL | Status: AC
  Administered 2019-02-02: 15:00:00 0.8 mg via SUBLINGUAL
  Filled 2019-02-02: qty 25

## 2019-02-02 MED ORDER — NITROGLYCERIN 0.4 MG SL SUBL
SUBLINGUAL_TABLET | SUBLINGUAL | Status: AC
  Administered 2019-02-02: 0.8 mg via SUBLINGUAL
  Filled 2019-02-02: qty 1

## 2019-02-02 MED ORDER — IOHEXOL 350 MG/ML SOLN
100.0000 mL | Freq: Once | INTRAVENOUS | Status: AC | PRN
  Administered 2019-02-02: 100 mL via INTRAVENOUS

## 2019-02-06 ENCOUNTER — Other Ambulatory Visit: Payer: Self-pay

## 2019-02-08 ENCOUNTER — Ambulatory Visit (INDEPENDENT_AMBULATORY_CARE_PROVIDER_SITE_OTHER): Payer: BC Managed Care – PPO | Admitting: Cardiovascular Disease

## 2019-02-08 ENCOUNTER — Telehealth: Payer: Self-pay | Admitting: Cardiovascular Disease

## 2019-02-08 ENCOUNTER — Encounter: Payer: Self-pay | Admitting: Cardiovascular Disease

## 2019-02-08 ENCOUNTER — Other Ambulatory Visit: Payer: Self-pay

## 2019-02-08 VITALS — BP 117/74 | HR 91 | Ht 66.5 in | Wt 162.6 lb

## 2019-02-08 DIAGNOSIS — R0789 Other chest pain: Secondary | ICD-10-CM | POA: Diagnosis not present

## 2019-02-08 DIAGNOSIS — Z20822 Contact with and (suspected) exposure to covid-19: Secondary | ICD-10-CM

## 2019-02-08 DIAGNOSIS — E785 Hyperlipidemia, unspecified: Secondary | ICD-10-CM

## 2019-02-08 DIAGNOSIS — R002 Palpitations: Secondary | ICD-10-CM

## 2019-02-08 DIAGNOSIS — I2510 Atherosclerotic heart disease of native coronary artery without angina pectoris: Secondary | ICD-10-CM | POA: Diagnosis not present

## 2019-02-08 DIAGNOSIS — Z79899 Other long term (current) drug therapy: Secondary | ICD-10-CM | POA: Diagnosis not present

## 2019-02-08 DIAGNOSIS — Z720 Tobacco use: Secondary | ICD-10-CM

## 2019-02-08 DIAGNOSIS — I2584 Coronary atherosclerosis due to calcified coronary lesion: Secondary | ICD-10-CM

## 2019-02-08 DIAGNOSIS — R6889 Other general symptoms and signs: Secondary | ICD-10-CM

## 2019-02-08 MED ORDER — METOPROLOL TARTRATE 25 MG PO TABS: ORAL_TABLET | ORAL | 1 refills | Status: DC

## 2019-02-08 MED ORDER — ROSUVASTATIN CALCIUM 20 MG PO TABS: 20.0000 mg | ORAL_TABLET | Freq: Every day | ORAL | 3 refills | Status: DC

## 2019-02-08 NOTE — Telephone Encounter (Signed)
LVM, reminding pt of her appt with Dr Claiborne Billings on 02-08-19.

## 2019-02-08 NOTE — Patient Instructions (Addendum)
Medication Instructions:  Increase Rosuvastatin 20 mg daily. Increase Metoprolol 25 mg in the morning, and 12.5 mg in the evening.  If you need a refill on your cardiac medications before your next appointment, please call your pharmacy.   Labs: CMET, LIPID in 2 months.  Follow-Up: At [facility], you and your health needs are our priority.  As part of our continuing mission to provide you with exceptional heart care, we have created designated Provider Care Teams.  These Care Teams include your primary Cardiologist (physician) and Advanced Practice Providers (APPs -  Physician Assistants and Nurse Practitioners) who all work together to provide you with the care you need, when you need it. You will need a follow up appointment in 2 months.  Please call our office 2 months in advance to schedule this appointment.  You may see Dr.Kelly or one of the following Advanced Practice Providers on your designated Care Team: Almyra Deforest, [place] . Fabian Sharp, PA-C

## 2019-02-08 NOTE — Progress Notes (Signed)
Cardiology Office Note    Date:  02/10/2019   ID:  Stacy Fernandez, DOB Dec 04, 1960, MRN [id number]  PCP:  Jettie Booze, NP  Cardiologist:  Shelva Majestic, MD    History of Present Illness:  Stacy Fernandez is a 58 y.o. female  who presents for an in office visit following a telemedicine evaluation in June 2020.    I had initially seen her in 2014 for evaluation of chest pain.  She has a history of tobacco use, migraine headaches as well as COPD/emphysema.  In October 2013 a nuclear perfusion study showed normal perfusion and function.  She also had a history of GERD with reflux and had undergone esophageal manometry.  She developed chest pain leading to a [facility] long hospital presentation.  She underwent diagnostic cardiac catheterization by Dr. Alvester Chou on December 25, 2012 which showed 40% ostial first diagonal branch stenosis of the LAD; all other vessels were normal.  EF was 60%.She had continued to smoke cigarettes and I saw her in follow-up of her hospitalization in September 2014.  She also has a history of a history of breast CA and is followed by Dr. Jana Hakim.  Apparently earlier this year both she and her husband develop symptoms of COVID-19.  They believe that this may have been acquired in their church since 19 members tested positive and to subsequently died.  Her husband developed COVID initially and was hospitalized at [facility] long for 3 weeks.  She was admitted to [place] home due to her COVID in early April and was hospitalized for 1 week.  The day of discharge is when the [place] open for COVID patients.  Subsequent testing 2 weeks later still revealed her to be COVID positive and it was not until a May test that she tested COVID negative.  Subsequent antibody testing revealed that she had a strong positive antibody response.  Due to a 2 to 4-week period of increasing palpitations as well as some vague discomfort in her chest described as a tightness she was referred for a  telemedicine evaluation with me on January 05, 2019.  During that evaluation she states that her levothyroxine dose had been reduced from 175 down to 125 mcg.  I recommended that she wear an event monitor for 2 weeks.  I initiated therapy with metoprolol tartrate initially at 12.5 mg twice a day.  Since I did not know her blood pressure I did not initiate at a higher dose.  At the time she was drinking 1 to 2 cups of coffee per day and discontinuance of all caffeine was recommended.  In light of her previous catheterization in 2014 which showed mild nonobstructive disease with her longstanding tobacco history I recommended she undergo CT coronary angiogram to further evaluate her chest tightness and assess for progressive CAD.  I also scheduled her for 2D echo Doppler study and follow-up chest x-ray.  We discussed the importance of complete smoking cessation.   Her 2D echo Doppler study was done on January 09, 2019 which showed an EF of 60 to 65%.  He had normal diastolic parameters.  There was mild aortic sclerosis and mild thickening of mitral valve leaflets.  Both atrium were normal in size.  A 2-week monitor from June 29 through January 29, 2019 was essentially unremarkable.  She was predominantly in sinus rhythm July 6 at 4:47 AM with a rate 56 and a maximum heart rate on January 28, 2019 at 7:49 PM at  126 bpm which was sinus tachycardia.  She underwent CT coronary angiography.  Calcium score was increased to 514 which was 99th percentile for age and sex matched control.  There is moderate calcified plaque in the proximal LAD with 50 to 69% stenosis as well as moderate calcified plaque in the proximal OM with 50 to 69% stenosis.  FFR was sent but results are still pending.  She has had issues with somewhat low blood pressure.  She denies any dizziness.  Laboratory from January 16, 2019 showed increased total cholesterol at 202, triglycerides 228, HDL 39, LDL 117.  Rosuvastatin 10 mg was initiated and she has been on  therapy for approximately 3 weeks and tolerating this well.  She presents for evaluation.   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)--  s/p  bilateral mastectomy w/ reconstruction and chemotherapy (05-08-2014 to 04-30-2015)   Chemotherapy-induced peripheral neuropathy (HCC)    FEET   Chronic migraine    caused by brain aneurysm--  BOTOX TX'S  by dr Krista Blue   CKD (chronic kidney disease), stage III ([place])    COPD with emphysema ([place]) pulmologist--  dr Elsworth Soho   advanded emphysema per CT    History of chemotherapy    left breast ca-- 05-09-2015   to 04-30-2015   History of gastroesophageal reflux (GERD)    no problems since Nissen fundoplication   Hypothyroidism    Rectovaginal fistula    Skin changes related to chemotherapy    ARMS   Supraclinoid carotid artery aneurysm, small neurologist-  dr willis/  dr Krista Blue   left internal - 2 mm;  aneurysm  vs.  infundibulum--  residual chronic migraine   Wears dentures    upper   Wears glasses     Past Surgical History:  Procedure Laterality Date   APPENDECTOMY  1992   BREAST RECONSTRUCTION WITH PLACEMENT OF TISSUE EXPANDER AND FLEX HD (ACELLULAR HYDRATED DERMIS) Bilateral 09/25/2014   Procedure: PLACEMENT OF BILATERAL TISSUE EXPANDER AND  ACELLULAR DERMIS FOR BREAST RECONSTRUCTION ;  Surgeon: [name], MD;  Location: [place];  Service: Plastics;  Laterality: Bilateral;   CHOLECYSTECTOMY  1999   ESOPHAGOGASTRODUODENOSCOPY (EGD) WITH PROPOFOL  01/18/2013   LAPAROSCOPIC NISSEN FUNDOPLICATION  [id number]   LEFT HEART CATHETERIZATION WITH CORONARY ANGIOGRAM N/A 12/26/2012   Procedure: LEFT HEART CATHETERIZATION WITH CORONARY ANGIOGRAM;  Surgeon: Lorretta Harp, MD;  Location: [facility] CATH LAB;  Service: Cardiovascular;  Laterality: N/A;    Nonobstructive CAD/  40-50% ostial D1/  normal LVF, ef 60%   LIPOSUCTION WITH LIPOFILLING Bilateral 12/18/2014   Procedure: LIPOFILLING TO BILATERAL CHEST;  Surgeon: [name], MD;  Location: [place];  Service: Plastics;  Laterality: Bilateral;   PORT-A-CATH REMOVAL Right 06/28/2015   Procedure: REMOVAL PORT-A-CATH;  Surgeon: Leighton Ruff, MD;  Location: [facility];  Service: General;  Laterality: Right;   PORTACATH PLACEMENT N/A 04/02/2014   Procedure: INSERTION PORT-A-CATH;  Surgeon: [name], MD;  Location: [place];  Service: General;  Laterality: N/A;   REMOVAL OF BILATERAL TISSUE EXPANDERS WITH PLACEMENT OF BILATERAL BREAST IMPLANTS Bilateral 12/18/2014   Procedure: REMOVAL OF BILATERAL TISSUE EXPANDERS,PLACEMENT SILICONE IMPLANTS ;  Surgeon: [name], MD;  Location: [place];  Service: Plastics;  Laterality: Bilateral;  SIMPLE MASTECTOMY WITH AXILLARY SENTINEL NODE BIOPSY Bilateral 04/02/2014   Procedure: LEFT TOTAL MASTECTOMY WITH LEFT AXILLARY SENTINEL NODE BIOPSY, RIGHT PROPHYLACTIC MASTETCTOMY;  Surgeon: [name], MD;  Location: [place];  Service: General;  Laterality: Bilateral;   TOTAL ABDOMINAL HYSTERECTOMY W/ BILATERAL SALPINGOOPHORECTOMY  1996   TRANSTHORACIC ECHOCARDIOGRAM  02-04-2015   grade 1 diastolic dysfunction, ef 29-47%/  trivial TR   VESICO-VAGINAL FISTULA REPAIR N/A 07/17/2015   Procedure: REPAIR OF RECTOVAGINAL FISTULA;  Surgeon: [name], MD;  Location: [facility];  Service: Gynecology;  Laterality: N/A;   VIDEO BRONCHOSCOPY Bilateral 01/16/2013   Procedure: VIDEO BRONCHOSCOPY WITHOUT FLUORO;  Surgeon: [name], MD;  Location: [facility];  Service: Cardiopulmonary;  Laterality: Bilateral;    Current Medications: Outpatient Medications Prior to Visit  Medication Sig Dispense Refill   acetaminophen (TYLENOL) 325 MG tablet Take 2 tablets (650 mg total) by mouth every 6 (six) hours as  needed for mild pain or headache (fever >/= 101).     aspirin EC 81 MG tablet Take 1 tablet (81 mg total) by mouth daily. 90 tablet 3   levothyroxine (SYNTHROID, LEVOTHROID) 125 MCG tablet Take 125 mcg by mouth daily before breakfast.     montelukast (SINGULAIR) 10 MG tablet Take 10 mg by mouth at bedtime.      PROAIR HFA 108 (90 BASE) MCG/ACT inhaler Inhale 2 puffs into the lungs every 4 (four) hours as needed for wheezing or shortness of breath. Reported on 07/08/2015     umeclidinium-vilanterol (ANORO ELLIPTA) 62.5-25 MCG/INH AEPB Inhale 1 puff into the lungs daily. 1 each 11   vitamin C (VITAMIN C) 500 MG tablet Take 1 tablet (500 mg total) by mouth daily.     zinc sulfate 220 (50 Zn) MG capsule Take 1 capsule (220 mg total) by mouth daily.     metoprolol tartrate (LOPRESSOR) 25 MG tablet Take 0.5 tablets (12.5 mg total) by mouth 2 (two) times daily. 90 tablet 1   rosuvastatin (CRESTOR) 10 MG tablet Take 1 tablet (10 mg total) by mouth daily. 90 tablet 3   chlorpheniramine-HYDROcodone (TUSSIONEX) 10-8 MG/5ML SUER Take 5 mLs by mouth 3 (three) times daily. 140 mL 0   ondansetron (ZOFRAN) 4 MG tablet Take 1 tablet (4 mg total) by mouth every 6 (six) hours as needed for nausea. 20 tablet 0   No facility-administered medications prior to visit.      Allergies:   Prednisone   Social History   Socioeconomic History   Marital status: Married    Spouse name: Eddie    Number of children: 4   Years of education: GED   Highest education level: Not on file  Occupational History    Employer: [employer]  Social Designer, fashion/clothing strain: Not hard at all   Food insecurity    Worry: Never true    Inability: Never true   Transportation needs    Medical: No    Non-medical: No  Tobacco Use   Smoking status: Current Every Day Smoker    Packs/day: 0.00    Years: 37.00    Pack years: 0.00    Types: Cigarettes, E-cigarettes    Last attempt to quit: 03/29/2014     Years since quitting: 4.8   Smokeless tobacco: Never Used   Tobacco comment: 5-7 cigs daily 06/07/17  Substance and Sexual Activity   Alcohol use: No   Drug use: No   Sexual activity: Not Currently  Lifestyle  Physical activity    Days per week: Patient refused    Minutes per session: Patient refused   Stress: Patient refused  Relationships   Social connections    Talks on phone: Patient refused    Gets together: Patient refused    Attends religious service: Patient refused    Active member of club or organization: Patient refused    Attends meetings of clubs or organizations: Patient refused    Relationship status: Patient refused  Other Topics Concern   Not on file  Social History Narrative   Patient lives at home with her husband [name]) Patient works full time.   Education- GED   Right handed.   Caffeine- one cup of coffee daily.     Family History:  The patient's **family history includes Breast cancer (age of onset: 62) in her paternal aunt; Cancer in her paternal grandfather; Colon cancer (age of onset: 75) in her paternal grandmother; Colon cancer (age of onset: 31) in her father; Heart attack in her maternal grandmother; Heart failure in her mother; Ovarian cancer (age of onset: 42) in her sister; Ovarian cancer (age of onset: 21) in an other family member; Throat cancer (age of onset: 30) in her brother; Throat cancer (age of onset: 80) in her brother.   ROS General: Negative; No fevers, chills, or night sweats;  HEENT: Negative; No changes in vision or hearing, sinus congestion, difficulty swallowing Pulmonary: Negative; No cough, wheezing, shortness of breath, hemoptysis Cardiovascular: See HPI GI: Negative; No nausea, vomiting, diarrhea, or abdominal pain GU: Negative; No dysuria, hematuria, or difficulty voiding Musculoskeletal: Negative; no myalgias, joint pain, or weakness Hematologic/Oncology: History of breast CA Endocrine: Negative; no heat/cold  intolerance; no diabetes Neuro: Negative; no changes in balance, headaches Skin: Negative; No rashes or skin lesions Psychiatric: Negative; No behavioral problems, depression Sleep: Negative; No snoring, daytime sleepiness, hypersomnolence, bruxism, restless legs, hypnogognic hallucinations, no cataplexy Other comprehensive 14 point system review is negative.   PHYSICAL EXAM:   VS:  BP 117/74    Pulse 91    Ht 5' 6.5" (1.689 m)    Wt 162 lb 9.6 oz (73.8 kg)    SpO2 98%    BMI 25.85 kg/m     Repeat blood pressure by me was 106/70 supine and 94/68 standing  Wt Readings from Last 3 Encounters:  02/08/19 162 lb 9.6 oz (73.8 kg)  01/05/19 148 lb (67.1 kg)  10/24/18 142 lb (64.4 kg)    General: Alert, oriented, no distress.  Skin: normal turgor, no rashes, warm and dry HEENT: Normocephalic, atraumatic. Pupils equal round and reactive to light; sclera anicteric; extraocular muscles intact;  Nose without nasal septal hypertrophy Mouth/Parynx benign; Mallinpatti scale, not assessed, wearing a mask Neck: No JVD, no carotid bruits; normal carotid upstroke Lungs: clear to ausculatation and percussion; no wheezing or rales Chest wall: without tenderness to palpitation Heart: PMI not displaced, RRR, s1 s2 normal, 1/6 systolic murmur, no diastolic murmur, no rubs, gallops, thrills, or heaves Abdomen: soft, nontender; no hepatosplenomehaly, BS+; abdominal aorta nontender and not dilated by palpation. Back: no CVA tenderness Pulses 2+ Musculoskeletal: full range of motion, normal strength, no joint deformities Extremities: no clubbing cyanosis or edema, Homan's sign negative  Neurologic: grossly nonfocal; Cranial nerves grossly wnl Psychologic: Normal mood and affect   Studies/Labs Reviewed:    October 29, 2018 ECG (independently read by me): Normal sinus rhythm at 74 bpm.  No ectopy.  Normal intervals.  No ST segment changes.  Recent Labs:  BMP Latest Ref Rng & Units 01/16/2019 10/29/2018  10/28/2018  Glucose 65 - 99 mg/dL 79 88 83  BUN 6 - 24 mg/dL _0 Creatinine 0.57 - 1.00 mg/dL 0.88 0.65 0.55  BUN/Creat Ratio 9 - 23 20 - -  Sodium 134 - 144 mmol/L 142 140 140  Potassium 3.5 - 5.2 mmol/L 5.1 4.4 3.7  Chloride 96 - 106 mmol/L 105 106 107  CO2 20 - 29 mmol/L _1 Calcium 8.7 - 10.2 mg/dL 9.5 8.7(L) 8.4(L)     Hepatic Function Latest Ref Rng & Units 01/16/2019 10/29/2018 10/28/2018  Total Protein 6.0 - 8.5 g/dL 6.7 6.3(L) 5.9(L)  Albumin 3.8 - 4.9 g/dL 4.4 3.1(L) 2.8(L)  AST 0 - 40 IU/L 15 52(H) 28  ALT 0 - 32 IU/L 16 37 21  Alk Phosphatase 39 - 117 IU/L 131(H) 75 69  Total Bilirubin 0.0 - 1.2 mg/dL 0.3 0.5 0.5    CBC Latest Ref Rng & Units 01/16/2019 10/29/2018 10/28/2018  WBC 3.4 - 10.8 x10E3/uL 4.7 4.0 3.2(L)  Hemoglobin 11.1 - 15.9 g/dL 12.7 10.7(L) 9.9(L)  Hematocrit 34.0 - 46.6 % 37.6 31.3(L) 29.0(L)  Platelets 150 - 450 x10E3/uL 244 283 243   Lab Results  Component Value Date   MCV 92 01/16/2019   MCV 94.6 10/29/2018   MCV 93.2 10/28/2018   Lab Results  Component Value Date   TSH 4.610 (H) 01/16/2019   Lab Results  Component Value Date   HGBA1C 5.0 12/26/2012     BNP No results found for: BNP  ProBNP    Component Value Date/Time   PROBNP 326.1 (H) 12/26/2012 0553     Lipid Panel     Component Value Date/Time   CHOL 202 (H) 01/16/2019 0852   TRIG 228 (H) 01/16/2019 0852   HDL 39 (L) 01/16/2019 0852   CHOLHDL 5.2 (H) 01/16/2019 0852   CHOLHDL 4.4 12/26/2012 0340   VLDL 17 12/26/2012 0340   LDLCALC 117 (H) 01/16/2019 0852     RADIOLOGY: Ct Coronary Morph W/cta Cor W/score W/ca W/cm &/or Wo/cm  Addendum Date: 02/03/2019   ADDENDUM REPORT: 02/03/2019 14:36 EXAM: Cardiac/Coronary  CT TECHNIQUE: The patient was scanned on a [employer]. FINDINGS: A 120 kV prospective scan was triggered in the descending thoracic aorta at 111 HU's. Axial non-contrast 3 mm slices were carried out through the heart. The data set was  analyzed on a dedicated work station and scored using the [place]. Gantry rotation speed was 250 msecs and collimation was .6 mm. No beta blockade and 0.8 mg of sl NTG was given. The 3D data set was reconstructed in 5% intervals of the 67-82 % of the R-R cycle. Diastolic phases were analyzed on a dedicated work station using MPR, MIP and VRT modes. The patient received 80 cc of contrast. Aorta:  Normal size.  No calcifications.  No dissection. Aortic Valve:  Trileaflet.  No calcifications. Coronary Arteries:  Normal coronary origin.  Right dominance. RCA is a large dominant artery that gives rise to PDA and PLVB. There is no plaque. Left main is a large artery that gives rise to LAD and LCX arteries. There is minimal calcified plaque in the ostial and distal LM 0-24%. LAD is a large vessel that gives rise to a small 1st diagonal and then a moderate sized 2nd diagonal. There is moderate calcified plaque in the ostial to proximal LAD with 50-69% stenosis. LCX is a very small non-dominant artery that  gives rise to one large bifurcating OM1 branch. There is moderate calcified plaque in the proximal OM with 50-69%. Other findings: Normal pulmonary vein drainage into the left atrium. Normal let atrial appendage without a thrombus. Normal size of the pulmonary artery. IMPRESSION: 1. Coronary calcium score of 514. This was 99th percentile for age and sex matched control. 2. Normal coronary origin with right dominance. 3. Moderate calcified plaque in the proximal LAD with 50-69%% stenosis. CAD RAD 3. 4. Moderate calcified plaque in the proximal OM with 50-69% stenosis. CAD RAD 3 5. FFR has been sent to evaluate LAD and OM. 6. Recommend Aggressive Risk Factor Modification. Fransico Him Electronically Signed   By: Fransico Him   On: 02/03/2019 14:36   Result Date: 02/03/2019 EXAM: OVER-READ INTERPRETATION  CT CHEST The following report is an over-read performed by radiologist Dr. Rolm Baptise of [facility],  [place] on 02/02/2019. This over-read does not include interpretation of cardiac or coronary anatomy or pathology. The coronary CTA interpretation by the cardiologist is attached. COMPARISON:  05/06/2017 FINDINGS: Vascular: Heart is normal size.  Visualized aorta is normal caliber. Mediastinum/Nodes: No adenopathy in the lower mediastinum or hila. Lungs/Pleura: Emphysematous changes. No confluent opacities or effusions. Upper Abdomen: Imaging into the upper abdomen shows no acute findings. Musculoskeletal: Bilateral breast implants. No acute bony abnormality. IMPRESSION: Emphysema. Aortic atherosclerosis. No acute extra cardiac abnormality. Electronically Signed: By: Rolm Baptise M.D. On: 02/02/2019 15:27     Additional studies/ records that were reviewed today include:  ECHO 02/04/2015 Study Conclusions  - Left ventricle: The cavity size was normal. Systolic function was normal. The estimated ejection fraction was in the range of 50% to 55%. Wall motion was normal; there were no regional wall motion abnormalities. Doppler parameters are consistent with abnormal left ventricular relaxation (grade 1 diastolic dysfunction). There was no evidence of elevated ventricular filling pressure by Doppler parameters. - Aortic valve: Trileaflet; normal thickness leaflets. Transvalvular velocity was within the normal range. There was no stenosis. There was no regurgitation. - Aortic root: The aortic root was normal in size. - Mitral valve: Structurally normal valve. There was no regurgitation. - Left atrium: The atrium was normal in size. - Right ventricle: The cavity size was normal. Wall thickness was normal. Systolic function was normal. - Right atrium: The atrium was normal in size. - Tricuspid valve: There was trivial regurgitation. - Pulmonary arteries: Systolic pressure was within the normal range. - Inferior vena cava: The vessel was normal in size. - Pericardium, extracardiac:  There was no pericardial effusion.   ECHO IMPRESSIONS 01/09/2019  1. The left ventricle has normal systolic function with an ejection fraction of 60-65%. The cavity size was normal. Left ventricular diastolic parameters were normal.  2. The right ventricle has normal systolic function. The cavity was normal. There is no increase in right ventricular wall thickness.  3. Mild thickening of the mitral valve leaflet.  4. The aortic valve is tricuspid. Mild thickening of the aortic valve. Mild calcification of the aortic valve.   Cardiac CT IMPRESSION:02/02/2019  1. Coronary calcium score of 514. This was 99th percentile for age and sex matched control.  2. Normal coronary origin with right dominance.  3. Moderate calcified plaque in the proximal LAD with 50-69%% stenosis. CAD RAD 3.  4. Moderate calcified plaque in the proximal OM with 50-69% stenosis. CAD RAD 3  5. FFR has been sent to evaluate LAD and OM.  6. Recommend Aggressive Risk Factor Modification.   ASSESSMENT:  1. Palpitations   2. Coronary atherosclerosis due to calcified coronary lesion   3. Chest tightness   4. Medication management   5. Tobacco abuse   6. Hyperlipidemia with target LDL less than 70   7. Covid-19 Virus Infection: April 2020      PLAN:  1.  History of COVID-19 requiring 1 week hospitalization: Her symptoms began on April 1.  She states her peak temperature was 104.  Her husband had developed COVID several days prior to her and he had been hospitalized for 13 days.  She received a 5-day course of hydroxychloroquine.  COVID test 2 weeks after discharge was still positive and ultimately in mid-May COVID test was negative.  She has strong antibody response.  2.  Palpitations: I reviewed her echo Doppler study which showed normal systolic and diastolic function.  She admits to noticing her heart rate speeding up particularly with housework.  Her two-week monitor showed predominantly sinus rhythm  without ectopy with fastest heart rate being sinus tachycardia at 128 bpm and slowest heart rate in the upper 50s while sleeping.  Symptoms have improved with initiation of low-dose metoprolol at 12.5 mg twice a day.  Since she continues to experience some palpitations and also has documented plaque on CTA I will slightly titrate metoprolol to 25 mg in the morning but continue 12.5 mg at night.  With low blood pressure and mild drop in blood pressure from supine to standing I have recommended increased hydration.  3.  CAD: Cardiac CT was reviewed with the patient.  Calcium score is elevated at 514 and there is moderate calcified plaque of 50 to 69% in the proximal LAD and obtuse marginal vessel.  FFR analysis is still pending.  Blood pressure may limit increased therapy pleasantly but I will try to slightly increase her beta-blocker regimen as noted above.  4.  Hyperlipidemia: Laboratory revealed total cholesterol 202, triglycerides 228 VLDL 46 HDL 39 LDL 117.  She has been on rosuvastatin 10 mg for the last several weeks I recommended further titration to 20 mg.  We will repeat chemistry and lipid studies in 2 to 3 months.  5.  Hypothyroidism: Most recent TSH 4.61 on her reduced dose of levothyroxine at 125 mcg.  6.  Tobacco history.  Unfortunately she still smokes but has significantly reduced her smoking.  We discussed complete smoking cessation.  7.  History of breast CA; diagnosed in 2015, followed by Dr. Jana Hakim   Medication Adjustments/Labs and Tests Ordered: Current medicines are reviewed at length with the patient today.  Concerns regarding medicines are outlined above.  Medication changes, Labs and Tests ordered today are listed in the Patient Instructions below. Patient Instructions  Medication Instructions:  Increase Rosuvastatin 20 mg daily. Increase Metoprolol 25 mg in the morning, and 12.5 mg in the evening.  If you need a refill on your cardiac medications before your next  appointment, please call your pharmacy.   Labs: CMET, LIPID in 2 months.  Follow-Up: At [facility], you and your health needs are our priority.  As part of our continuing mission to provide you with exceptional heart care, we have created designated Provider Care Teams.  These Care Teams include your primary Cardiologist (physician) and Advanced Practice Providers (APPs -  Physician Assistants and Nurse Practitioners) who all work together to provide you with the care you need, when you need it. You will need a follow up appointment in 2 months.  Please call our office 2 months in advance  to schedule this appointment.  You may see Dr.Meggin Ola or one of the following Advanced Practice Providers on your designated Care Team: Almyra Deforest, PA-C  [facility]ian [place], [place]        Signed, Shelva Majestic, MD  02/10/2019 11:00 AM    [facility] [address] [place], [place], [place]  [address] Phone: [phone]

## 2019-02-10 ENCOUNTER — Encounter: Payer: Self-pay | Admitting: Cardiovascular Disease

## 2019-02-10 DIAGNOSIS — Z8619 Personal history of other infectious and parasitic diseases: Secondary | ICD-10-CM | POA: Diagnosis not present

## 2019-02-10 DIAGNOSIS — F418 Other specified anxiety disorders: Secondary | ICD-10-CM | POA: Diagnosis not present

## 2019-02-10 DIAGNOSIS — E039 Hypothyroidism, unspecified: Secondary | ICD-10-CM | POA: Diagnosis not present

## 2019-02-10 DIAGNOSIS — J449 Chronic obstructive pulmonary disease, unspecified: Secondary | ICD-10-CM | POA: Diagnosis not present

## 2019-02-17 ENCOUNTER — Telehealth: Payer: Self-pay

## 2019-02-17 NOTE — Telephone Encounter (Signed)
Received a call from [place] with [employer] patient's short term disability plan.She wanted to ask Dr.Kelly how long will patient be out of work and what are her limitations.Advised Dr.Kelly out of office today.I will send message to him for advice.

## 2019-02-20 DIAGNOSIS — Z8619 Personal history of other infectious and parasitic diseases: Secondary | ICD-10-CM | POA: Diagnosis not present

## 2019-02-22 NOTE — Telephone Encounter (Signed)
Probably okay for patient to resume work with initial reduced hours and as tolerated titrate back to normal hours

## 2019-02-23 NOTE — Telephone Encounter (Signed)
No number to contact the disability plan back.  Unable to let them know of this.

## 2019-03-15 ENCOUNTER — Telehealth: Payer: BLUE CROSS/BLUE SHIELD | Admitting: Cardiovascular Disease

## 2019-03-24 DIAGNOSIS — I2510 Atherosclerotic heart disease of native coronary artery without angina pectoris: Secondary | ICD-10-CM | POA: Diagnosis not present

## 2019-03-24 DIAGNOSIS — Z79899 Other long term (current) drug therapy: Secondary | ICD-10-CM | POA: Diagnosis not present

## 2019-03-24 DIAGNOSIS — I2583 Coronary atherosclerosis due to lipid rich plaque: Secondary | ICD-10-CM | POA: Diagnosis not present

## 2019-03-24 LAB — COMPREHENSIVE METABOLIC PANEL
ALT: 16 IU/L (ref 0–32)
AST: 20 IU/L (ref 0–40)
Albumin/Globulin Ratio: 1.9 (ref 1.2–2.2)
Albumin: 4.6 g/dL (ref 3.8–4.9)
Alkaline Phosphatase: 126 IU/L — ABNORMAL HIGH (ref 39–117)
BUN/Creatinine Ratio: 19 (ref 9–23)
BUN: 15 mg/dL (ref 6–24)
Bilirubin Total: 0.5 mg/dL (ref 0.0–1.2)
CO2: 20 mmol/L (ref 20–29)
Calcium: 9.6 mg/dL (ref 8.7–10.2)
Chloride: 106 mmol/L (ref 96–106)
Creatinine, Ser: 0.81 mg/dL (ref 0.57–1.00)
GFR calc Af Amer: 93 mL/min/{1.73_m2} (ref >59)
GFR calc non Af Amer: 81 mL/min/{1.73_m2} (ref >59)
Globulin, Total: 2.4 g/dL (ref 1.5–4.5)
Glucose: 90 mg/dL (ref 65–99)
Potassium: 4.7 mmol/L (ref 3.5–5.2)
Sodium: 143 mmol/L (ref 134–144)
Total Protein: 7 g/dL (ref 6.0–8.5)

## 2019-03-24 LAB — LIPID PANEL
Chol/HDL Ratio: 3.3 ratio (ref 0.0–4.4)
Cholesterol, Total: 132 mg/dL (ref 100–199)
HDL: 40 mg/dL (ref >39)
LDL Chol Calc (NIH): 55 mg/dL (ref 0–99)
Triglycerides: 228 mg/dL — ABNORMAL HIGH (ref 0–149)
VLDL Cholesterol Cal: 37 mg/dL (ref 5–40)

## 2019-03-26 DIAGNOSIS — M25521 Pain in right elbow: Secondary | ICD-10-CM | POA: Diagnosis not present

## 2019-03-26 DIAGNOSIS — R51 Headache: Secondary | ICD-10-CM | POA: Diagnosis not present

## 2019-03-26 DIAGNOSIS — M79661 Pain in right lower leg: Secondary | ICD-10-CM | POA: Diagnosis not present

## 2019-03-26 DIAGNOSIS — Z043 Encounter for examination and observation following other accident: Secondary | ICD-10-CM | POA: Diagnosis not present

## 2019-03-26 DIAGNOSIS — M25561 Pain in right knee: Secondary | ICD-10-CM | POA: Diagnosis not present

## 2019-03-26 DIAGNOSIS — S4991XA Unspecified injury of right shoulder and upper arm, initial encounter: Secondary | ICD-10-CM | POA: Diagnosis not present

## 2019-03-26 DIAGNOSIS — W1789XA Other fall from one level to another, initial encounter: Secondary | ICD-10-CM | POA: Diagnosis not present

## 2019-03-26 DIAGNOSIS — M47812 Spondylosis without myelopathy or radiculopathy, cervical region: Secondary | ICD-10-CM | POA: Diagnosis not present

## 2019-03-26 DIAGNOSIS — M542 Cervicalgia: Secondary | ICD-10-CM | POA: Diagnosis not present

## 2019-03-26 DIAGNOSIS — M25571 Pain in right ankle and joints of right foot: Secondary | ICD-10-CM | POA: Diagnosis not present

## 2019-03-26 DIAGNOSIS — S82831A Other fracture of upper and lower end of right fibula, initial encounter for closed fracture: Secondary | ICD-10-CM | POA: Diagnosis not present

## 2019-03-26 DIAGNOSIS — M7989 Other specified soft tissue disorders: Secondary | ICD-10-CM | POA: Diagnosis not present

## 2019-03-26 DIAGNOSIS — W19XXXA Unspecified fall, initial encounter: Secondary | ICD-10-CM | POA: Diagnosis not present

## 2019-03-26 DIAGNOSIS — S134XXA Sprain of ligaments of cervical spine, initial encounter: Secondary | ICD-10-CM | POA: Diagnosis not present

## 2019-03-26 DIAGNOSIS — Y999 Unspecified external cause status: Secondary | ICD-10-CM | POA: Diagnosis not present

## 2019-03-30 DIAGNOSIS — S199XXA Unspecified injury of neck, initial encounter: Secondary | ICD-10-CM | POA: Diagnosis not present

## 2019-03-30 DIAGNOSIS — S82831A Other fracture of upper and lower end of right fibula, initial encounter for closed fracture: Secondary | ICD-10-CM | POA: Diagnosis not present

## 2019-04-07 ENCOUNTER — Other Ambulatory Visit: Payer: Self-pay

## 2019-04-07 ENCOUNTER — Telehealth: Payer: Self-pay | Admitting: Orthopaedic Surgery

## 2019-04-07 ENCOUNTER — Ambulatory Visit (INDEPENDENT_AMBULATORY_CARE_PROVIDER_SITE_OTHER): Payer: BC Managed Care – PPO | Admitting: Orthopaedic Surgery

## 2019-04-07 ENCOUNTER — Encounter (INDEPENDENT_AMBULATORY_CARE_PROVIDER_SITE_OTHER): Payer: Self-pay

## 2019-04-07 ENCOUNTER — Encounter: Payer: Self-pay | Admitting: Orthopaedic Surgery

## 2019-04-07 ENCOUNTER — Ambulatory Visit: Payer: Self-pay

## 2019-04-07 VITALS — BP 104/84 | HR 79 | Ht 66.5 in | Wt 162.0 lb

## 2019-04-07 DIAGNOSIS — Z8781 Personal history of (healed) traumatic fracture: Secondary | ICD-10-CM | POA: Diagnosis not present

## 2019-04-07 DIAGNOSIS — R0781 Pleurodynia: Secondary | ICD-10-CM | POA: Diagnosis not present

## 2019-04-07 MED ORDER — HYDROCODONE-ACETAMINOPHEN 5-325 MG PO TABS: 1.0000 | ORAL_TABLET | Freq: Four times a day (QID) | ORAL | 0 refills | Status: DC | PRN

## 2019-04-07 NOTE — Progress Notes (Signed)
Office Visit Note   Patient: Stacy Fernandez           Date of Birth: 1961/04/24           MRN: [id number] Visit Date: 04/07/2019              Requested by: Jettie Booze, NP [address] [place],  [place] [address] PCP: Jettie Booze, NP   Assessment & Plan: Visit Diagnoses:  1. Rib pain on right side   2. History of fibula fracture     Plan: Nondisplaced right proximal fibula fracture without neurologic deficit.  We will try a knee immobilizer so that she can wear a steel toed shoe at work.  Return for reevaluation repeat films in about 3 weeks.  Possible right fourth or fifth rib fracture.  Lung expanded.  No specific treatment.  Should resolve over the next 3 to 4 weeks.  Cervical spine pain with MRIs can consistent with degenerative changes but without acute injury.  May wean herself from the neck brace. Renew hydrocodone  Follow-Up Instructions: Return in about 3 weeks (around 04/28/2019).   Orders:  Orders Placed This Encounter  Procedures  . XR Ribs Unilateral Right   Meds ordered this encounter  Medications  . HYDROcodone-acetaminophen (NORCO/VICODIN) 5-325 MG tablet    Sig: Take 1 tablet by mouth every 6 (six) hours as needed for moderate pain.    Dispense:  30 tablet    Refill:  0      Procedures: No procedures performed   Clinical Data: No additional findings.   Subjective: Chief Complaint  Patient presents with  . Right Leg - Injury    DOI 03/26/2019  Patient presents today for her right lower leg. She fell [place] concrete steps [place] race [place] on 03[place]26/2019. She was taken to [facility] for treatment. She was told that she fractured her fibular head on the right side. 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 Her pain is located laterally. She has pain if she turns her leg. She is taking hydrocodone 7.5. She is also complaining of pain in the right rib cage. She said that nobody x-rayed or examined that area, but she has pain  with inspiration. She is a breast cancer survivor and wonders if possibly her right sided pain is coming from the incision area she has. 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  She has to wear steel toed shoe.  No related numbness or tingling in her right lower extremity.  Still having some pain along the lateral aspect of her right knee in the area of the fibular head.  I do not have any of the films for evaluation but x-ray report demonstrates an acute minimally displaced fracture of the fibular head Having some persistent pain along the right lateral chest wall in the midaxillary line.  No difficulty breathing. Had MRI scan of cervical spine with the report addended to the chart.  There was moderately severe disc degeneration and loss of disc space height at C6-7 with moderate disc desiccation and mild to moderate loss of the disc space height throughout the remainder of the cervical spine associated with straightening of the cervical lordosis.  No acute injury.  Not experiencing any numbness or tingling into either upper extremity. Questionable history of injury to either clavicle or shoulder at the time of her evaluation at [facility] but not having any problem with  overhead motion or movement.  HPI  Review of Systems   Objective: Vital Signs: BP 104/84   Pulse 79   Ht 5' 6.5" (1.689 m)   Wt 162 lb (73.5 kg)   BMI 25.76 kg/m   Physical Exam Constitutional:      Appearance: She is well-developed.  Eyes:     Pupils: Pupils are equal, round, and reactive to light.  Pulmonary:     Effort: Pulmonary effort is normal.  Skin:    General: Skin is warm and dry.  Neurological:     Mental Status: She is alert and oriented to person, place, and time.  Psychiatric:        Behavior: Behavior normal.     Ortho Exam awake alert and oriented x3.  Comfortable sitting.  Full overhead motion of right shoulder without any significant pain.  No bruising.  Skin  intact.  Some discomfort along the middle axillary line just distal to the axilla right chest wall.  No crepitation.  No difficulty breathing.  He is wearing a cervical brace.  Neurologically intact.  Has surgical absence of both breasts from prior breast surgery. Some tenderness over the right fibular head proximally.  No bruising.  Neurologically intact. Specialty Comments:  No specialty comments available.  Imaging: Xr Ribs Unilateral Right  Result Date: 04/07/2019 Rib detail films are obtained on the right.  There is a possibility of a nondisplaced fracture of the fourth or fifth rib right lung is expanded.  No acute injuries to right clavicle or shoulder    PMFS History: Patient Active Problem List   Diagnosis Date Noted  . History of fibula fracture 04/07/2019  . Suspected Covid-19 Virus Infection 10/24/2018  . Vertigo 11/30/2016  . 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 Surgery follow-up 07/17/2015  . Genetic testing 06/03/2015  . Pain in joint, pelvic region and thigh 04/30/2015  . Brain aneurysm 04/25/2015  . Back pain 04/19/2015  . Renal insufficiency 04/19/2015  . Lung nodule 12/11/2014  . Ankle edema 10/02/2014  . Acquired absence of breast and nipple 09/25/2014  . Neuropathy due to chemotherapeutic drug ([place]) 08/07/2014  . Chemotherapy induced neutropenia ([facility]) 08/07/2014  . Insomnia 07/31/2014  . 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 Atherosclerosis 07/17/2014  . Irregular bowel habits 06/27/2014  . Mucositis due to chemotherapy 06/05/2014  . Diarrhea 05/29/2014  . Pain at surgical site 05/07/2014  . Family history of malignant neoplasm of gastrointestinal tract 03/07/2014  . Family history of malignant neoplasm of breast 03/07/2014  . Family history of malignant neoplasm of ovary 03/07/2014  . Malignant neoplasm of upper-outer quadrant of left breast in female, estrogen receptor negative ([facility]) 02/26/2014  . CAD (coronary artery disease) 04/16/2013  .  Intractable migraine without aura 02/16/2013  . COPD exacerbation ([facility] [place]) 02/01/2013  . S/P Nissen fundoplication (without gastrostomy tube) procedure 01/05/2013  . COPD (chronic obstructive pulmonary disease) ([place]) 01/03/2013  . Chest pain 12/25/2012  . Hypothyroidism 12/25/2012  . Tobacco abuse 12/25/2012   Past Medical History:  Diagnosis Date  . Anxiety   . 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 Chemotherapy-induced peripheral neuropathy (HCC)    FEET  . 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  CKD (chronic kidney disease), stage III ([place])   . 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 History of chemotherapy    left breast ca-- 05-09-2015   to 04-30-2015  . History of gastroesophageal reflux (GERD)    no problems since Nissen fundoplication  . Hypothyroidism   . Rectovaginal fistula   . Skin changes related to chemotherapy    ARMS  . Supraclinoid carotid artery aneurysm, small neurologist-  dr willis/  dr Krista Blue   left internal - 2 mm;  aneurysm  vs.  infundibulum--  residual chronic migraine  . Wears dentures    upper  . Wears glasses     Family History  Problem Relation Age of Onset  . Heart failure Mother   . Colon cancer Father 53       stomach cancer also in 49s  . Throat cancer Brother 69       smoker  . Heart attack Maternal Grandmother   . Colon cancer Paternal Grandmother 82  . Cancer Paternal Grandfather        kidney and bladder  . Ovarian cancer Sister 4       ovarian cancer at 18, colorectal cancer at 94  . Throat cancer Brother 37       throat cancer, smoker  . Breast cancer Paternal Aunt 32  . Ovarian cancer Other 33       niece with ovarian cancer    Past Surgical  History:  Procedure Laterality Date  . APPENDECTOMY  1992  . BREAST RECONSTRUCTION WITH PLACEMENT OF TISSUE EXPANDER AND FLEX HD (ACELLULAR HYDRATED DERMIS) Bilateral 09/25/2014   Procedure: PLACEMENT OF BILATERAL TISSUE EXPANDER AND  ACELLULAR DERMIS FOR BREAST RECONSTRUCTION ;  Surgeon: [name], MD;  Location: [facility];  Service: Plastics;  Laterality: Bilateral;  . CHOLECYSTECTOMY  1999  . ESOPHAGOGASTRODUODENOSCOPY (EGD) WITH PROPOFOL  01/18/2013  . LAPAROSCOPIC NISSEN FUNDOPLICATION  [id number]  . LEFT HEART CATHETERIZATION WITH CORONARY ANGIOGRAM N/A 12/26/2012   Procedure: LEFT HEART CATHETERIZATION WITH CORONARY ANGIOGRAM;  Surgeon: Lorretta Harp, MD;  Location[facility] CATH LAB;  Service: Cardiovascular;  Laterality: N/A;   Nonobstructive CAD/  40-50% ostial D1/  normal LVF, ef 60%  . LIPOSUCTION WITH LIPOFILLING Bilateral 12/18/2014   Procedure: LIPOFILLING TO BILATERAL CHEST;  Surgeon: [name], MD;  Location: [facility];  Service: Plastics;  Laterality: Bilateral;  . PORT-A-CATH REMOVAL Right 06/28/2015   Procedure: REMOVAL PORT-A-CATH;  Surgeon: Leighton Ruff, MD;  Location[facility];  Service: General;  Laterality: Right;  . PORTACATH PLACEMENT N/A 04/02/2014   Procedure: INSERTION PORT-A-CATH;  Surgeon: [name], MD;  Location: [facility];  Service: General;  Laterality: N/A;  . REMOVAL OF BILATERAL TISSUE EXPANDERS WITH PLACEMENT OF BILATERAL BREAST IMPLANTS Bilateral 12/18/2014   Procedure: REMOVAL OF BILATERAL TISSUE EXPANDERS,PLACEMENT SILICONE IMPLANTS ;  Surgeon: [name], MD;  Location: [facility];  Service: Plastics;  Laterality: Bilateral;  . SIMPLE MASTECTOMY WITH AXILLARY SENTINEL NODE BIOPSY Bilateral 04/02/2014   Procedure: LEFT TOTAL MASTECTOMY WITH LEFT AXILLARY SENTINEL NODE BIOPSY, RIGHT PROPHYLACTIC MASTETCTOMY;  Surgeon: [name], MD;  Location: [facility];  Service: General;   Laterality: Bilateral;  . TOTAL ABDOMINAL HYSTERECTOMY W/ BILATERAL SALPINGOOPHORECTOMY  1996  . TRANSTHORACIC ECHOCARDIOGRAM  02-04-2015   grade 1 diastolic dysfunction, ef [id number]  trivial TR  . VESICO-VAGINAL FISTULA REPAIR N/A 07/17/2015   Procedure: REPAIR OF RECTOVAGINAL FISTULA;  Surgeon: [name], MD;  Location: [facility];  Service: Gynecology;  Laterality: N/A;  . VIDEO BRONCHOSCOPY Bilateral 01/16/2013   Procedure: VIDEO BRONCHOSCOPY WITHOUT FLUORO;  Surgeon: [name], MD;  Location: [facility];  Service: Cardiopulmonary;  Laterality: Bilateral;   Social History   Occupational History    Employer: [employer]  Tobacco Use  . Smoking status: Current Every Day Smoker    Packs/day: 0.00    Years: 37.00    Pack years: 0.00    Types: Cigarettes, E-cigarettes    Last attempt to quit: 03/29/2014    Years since quitting: 5.0  . Smokeless tobacco: Never Used  . Tobacco comment: 5-7 cigs daily 06/07/17  Substance and Sexual Activity  . Alcohol use: No  . Drug use: No  . Sexual activity: Not Currently

## 2019-04-07 NOTE — Telephone Encounter (Signed)
Patient filled out an authorization for [employer]ucks to receive information from [place], and paid $25.00 in cash. Authorization, and cash was sent to [facility]. 04/07/2019

## 2019-04-17 ENCOUNTER — Ambulatory Visit (INDEPENDENT_AMBULATORY_CARE_PROVIDER_SITE_OTHER): Payer: BC Managed Care – PPO | Admitting: Cardiovascular Disease

## 2019-04-17 ENCOUNTER — Other Ambulatory Visit: Payer: Self-pay

## 2019-04-17 DIAGNOSIS — I2510 Atherosclerotic heart disease of native coronary artery without angina pectoris: Secondary | ICD-10-CM

## 2019-04-17 DIAGNOSIS — Z79899 Other long term (current) drug therapy: Secondary | ICD-10-CM | POA: Diagnosis not present

## 2019-04-17 DIAGNOSIS — Z01818 Encounter for other preprocedural examination: Secondary | ICD-10-CM | POA: Diagnosis not present

## 2019-04-17 DIAGNOSIS — I2584 Coronary atherosclerosis due to calcified coronary lesion: Secondary | ICD-10-CM

## 2019-04-17 DIAGNOSIS — R002 Palpitations: Secondary | ICD-10-CM | POA: Diagnosis not present

## 2019-04-17 DIAGNOSIS — Z8619 Personal history of other infectious and parasitic diseases: Secondary | ICD-10-CM

## 2019-04-17 DIAGNOSIS — R0789 Other chest pain: Secondary | ICD-10-CM | POA: Diagnosis not present

## 2019-04-17 DIAGNOSIS — Z720 Tobacco use: Secondary | ICD-10-CM

## 2019-04-17 DIAGNOSIS — E785 Hyperlipidemia, unspecified: Secondary | ICD-10-CM | POA: Diagnosis not present

## 2019-04-17 DIAGNOSIS — Z8616 Personal history of COVID-19: Secondary | ICD-10-CM

## 2019-04-17 DIAGNOSIS — Z853 Personal history of malignant neoplasm of breast: Secondary | ICD-10-CM

## 2019-04-17 MED ORDER — ISOSORBIDE MONONITRATE ER 30 MG PO TB24: 30.0000 mg | ORAL_TABLET | Freq: Every day | ORAL | 6 refills | Status: DC

## 2019-04-17 NOTE — Progress Notes (Signed)
Cardiology Office Note    Date:  04/18/2019   ID:  Stacy Fernandez, DOB Nov 16, 1960, MRN [id number]  PCP:  Stacy Booze, NP  Cardiologist:  Shelva Majestic, MD    History of Present Illness:  Stacy Fernandez is a 58 y.o. female  who presents to the office for 3-monthfollow-up cardiology evaluation.    I  Initially saw Stacy Fernandez 2014 for evaluation of chest pain.  She has a history of tobacco use, migraine headaches as well as COPD/emphysema.  In October 2013 a nuclear perfusion study showed normal perfusion and function.  She also had a history of GERD with reflux and had undergone esophageal manometry.  [facility]  She underwent diagnostic cardiac catheterization by Dr. BGwenlyn Foundon December 25, 2012 which showed 40% ostial first diagonal branch stenosis of the LAD; all other vessels were normal.  EF was 60%.She had continued to smoke cigarettes and I saw her in follow-up of her hospitalization in September 2014.  She has a history of a history of breast CA and is followed by Dr. MJana Hakim  Apparently earlier this year both she and her husband develop symptoms of COVID-19.  They believe that this may have been acquired in their church since 19 members tested positive and to subsequently died.  Her [facility] developed COVID [facility]  [facility] early April and was hospitalized for 1 week.  [facility] [place] [facility]  Subsequent testing 2 weeks later still revealed her to be COVID positive and it was not until a May test that she tested COVID negative.  Subsequent antibody testing revealed that she had a strong positive antibody response.  Due to a 2 to 4-week period of increasing palpitations as well as some vague discomfort in her chest described as a tightness she was referred for a telemedicine  evaluation with me on January 05, 2019.  During that evaluation she states that her levothyroxine dose had been reduced from 175 down to 125 mcg.  I recommended that she wear an event monitor for 2 weeks.  I initiated therapy with metoprolol tartrate initially at 12.5 mg twice a day.  Since I did not know her blood pressure I did not initiate at a higher dose.  At the time she was drinking 1 to 2 cups of coffee per day and discontinuance of all caffeine was recommended.  In light of her previous catheterization in 2014 which showed mild nonobstructive disease with her longstanding tobacco history I recommended she undergo CT coronary angiogram to further evaluate her chest tightness and assess for progressive CAD.  I also scheduled her for 2D echo Doppler study and follow-up chest x-ray.  We discussed the importance of complete smoking cessation.   Her 2D echo Doppler study was done on January 09, 2019 which showed an EF of 60 to 65%.  He had normal diastolic parameters.  There was mild aortic sclerosis and mild thickening of mitral valve leaflets.  Both atrium were normal in size.  A 2-week monitor from June 29 through January 29, 2019 was essentially unremarkable.  She was predominantly in sinus rhythm July 6 at 4:47 AM with a rate 56 and a maximum heart rate on January 28, 2019 at 7:49 PM at 126 bpm which was sinus tachycardia.  She underwent CT coronary angiography.  Calcium score was increased to 514 which was 99th percentile for age and sex matched control.  There is moderate calcified plaque in the proximal LAD with 50 to 69% stenosis as well as moderate calcified plaque in the proximal OM with 50 to 69% stenosis.  FFR was sent but results are still pending.  She has had issues with somewhat low blood pressure.  Laboratory from January 16, 2019 showed increased total cholesterol at 202, triglycerides 228, HDL 39, LDL 117.  Rosuvastatin 10 mg was initiated and she has been on therapy for approximately 3 weeks and  tolerating this well.    Since I last saw her, her FFR analysis was essentially negative with mean at 0.99, proximal LAD 0.98, mid LAD 0.84, however the distal LAD was 0.75.  The circumflex and RCA FFR's were all normal at 0.9 and above.  At that time, continued medical therapy was recommended.  Over the past 4 weeks, Stacy Fernandez has noticed nasal palpitations and also admits to episodes of chest pressure.  She had fallen 4 weeks ago and fractured her ribs and fibula.   Oftentimes her chest pressure may last 10 to 15 minutes and resolve spontaneously.  She presents for reevaluation.   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)--  s/p  bilateral mastectomy w/ reconstruction and chemotherapy (05-08-2014 to 04-30-2015)   Chemotherapy-induced peripheral neuropathy (HCC)    FEET   Chronic migraine    caused by brain aneurysm--  BOTOX TX'S  by dr Krista Blue   CKD (chronic kidney disease), stage III ([place])    COPD with emphysema ([place]) pulmologist--  dr Elsworth Soho   advanded emphysema per CT    History of chemotherapy    left breast ca-- 05-09-2015   to 04-30-2015   History of gastroesophageal reflux (GERD)    no problems since Nissen fundoplication   Hypothyroidism    Rectovaginal fistula    Skin changes related to chemotherapy    ARMS   Supraclinoid carotid artery aneurysm, small neurologist-  dr willis/  dr Krista Blue   left internal - 2 mm;  aneurysm  vs.  infundibulum--  residual chronic migraine   Wears dentures    upper   Wears glasses     Past Surgical History:  Procedure Laterality Date   APPENDECTOMY  1992   BREAST RECONSTRUCTION WITH PLACEMENT OF TISSUE EXPANDER AND FLEX HD (ACELLULAR HYDRATED DERMIS) Bilateral 09/25/2014   Procedure: PLACEMENT OF BILATERAL TISSUE EXPANDER AND  ACELLULAR DERMIS FOR BREAST RECONSTRUCTION  ;  Surgeon: [name], MD;  Location: [place];  Service: Plastics;  Laterality: Bilateral;   CHOLECYSTECTOMY  1999   ESOPHAGOGASTRODUODENOSCOPY (EGD) WITH PROPOFOL  01/18/2013   LAPAROSCOPIC NISSEN FUNDOPLICATION  [phone]   LEFT HEART CATHETERIZATION WITH CORONARY ANGIOGRAM N/A 12/26/2012   Procedure: LEFT HEART CATHETERIZATION WITH CORONARY ANGIOGRAM;  Surgeon: Lorretta Harp, MD;  Location[facility] CATH LAB;  Service: Cardiovascular;  Laterality: N/A;   Nonobstructive CAD/  40-50% ostial D1/  normal LVF, ef 60%   LIPOSUCTION WITH LIPOFILLING Bilateral 12/18/2014   Procedure: LIPOFILLING TO BILATERAL CHEST;  Surgeon: [name], MD;  Location: [place];  Service: Plastics;  Laterality: Bilateral;   PORT-A-CATH REMOVAL Right 06/28/2015   Procedure: REMOVAL PORT-A-CATH;  Surgeon: Leighton Ruff, MD;  Location[facility];  Service: General;  Laterality: Right;   PORTACATH PLACEMENT N/A 04/02/2014   Procedure: INSERTION PORT-A-CATH;  Surgeon: [name], MD;  Location: [place];  Service: General;  Laterality: N/A;   REMOVAL OF BILATERAL TISSUE EXPANDERS WITH PLACEMENT OF BILATERAL BREAST IMPLANTS Bilateral 12/18/2014   Procedure: REMOVAL OF BILATERAL TISSUE EXPANDERS,PLACEMENT SILICONE IMPLANTS ;  Surgeon: [name], MD;  Location: [place];  Service: Plastics;  Laterality: Bilateral;   SIMPLE MASTECTOMY WITH AXILLARY SENTINEL NODE BIOPSY Bilateral 04/02/2014   Procedure: LEFT TOTAL MASTECTOMY WITH LEFT AXILLARY SENTINEL NODE BIOPSY, RIGHT PROPHYLACTIC MASTETCTOMY;  Surgeon: [name], MD;  Location: [place];  Service: General;  Laterality: Bilateral;   TOTAL ABDOMINAL HYSTERECTOMY W/ BILATERAL SALPINGOOPHORECTOMY  1996   TRANSTHORACIC ECHOCARDIOGRAM  02-04-2015   grade 1 diastolic dysfunction, ef 78-24%/  trivial TR   VESICO-VAGINAL FISTULA REPAIR N/A 07/17/2015   Procedure: REPAIR OF RECTOVAGINAL  FISTULA;  Surgeon: [name], MD;  Location: [facility];  Service: Gynecology;  Laterality: N/A;   VIDEO BRONCHOSCOPY Bilateral 01/16/2013   Procedure: VIDEO BRONCHOSCOPY WITHOUT FLUORO;  Surgeon: [name], MD;  Location: [facility];  Service: Cardiopulmonary;  Laterality: Bilateral;    Current Medications: Outpatient Medications Prior to Visit  Medication Sig Dispense Refill   acetaminophen (TYLENOL) 325 MG tablet Take 2 tablets (650 mg total) by mouth every 6 (six) hours as needed for mild pain or headache (fever >/= 101).     aspirin EC 81 MG tablet Take 1 tablet (81 mg total) by mouth daily. 90 tablet 3   HYDROcodone-acetaminophen (NORCO/VICODIN) 5-325 MG tablet Take 1 tablet by mouth every 6 (six) hours as needed for moderate pain. 30 tablet 0   levothyroxine (SYNTHROID, LEVOTHROID) 125 MCG tablet Take 125 mcg by mouth daily before breakfast.     metoprolol tartrate (LOPRESSOR) 25 MG tablet Take 25 mg in the morning, and 12.5 mg in the evening. 90 tablet 1   montelukast (SINGULAIR) 10 MG tablet Take 10 mg by mouth at bedtime.      PROAIR HFA 108 (90 BASE) MCG/ACT inhaler Inhale 2 puffs into the lungs every 4 (four) hours as needed for wheezing or shortness of breath. Reported on 07/08/2015     rosuvastatin (CRESTOR) 20 MG tablet Take 1 tablet (20 mg total) by mouth daily. 90 tablet 3   umeclidinium-vilanterol (ANORO ELLIPTA) 62.5-25 MCG/INH AEPB Inhale 1 puff into the lungs daily. 1 each 11   vitamin C (VITAMIN C) 500 MG tablet Take 1 tablet (500 mg total) by mouth daily.     zinc sulfate 220 (50 Zn) MG capsule Take 1 capsule (220 mg total) by mouth daily.     No facility-administered medications prior to visit.      Allergies:   Prednisone   Social History   Socioeconomic History   Marital status: Married    Spouse name: Eddie    Number of children: 4   Years of education: GED   Highest education level: Not on file  Occupational History    Employer: [employer]  Social Designer, fashion/clothing strain: Not hard at all   Food insecurity    Worry: Never true    Inability: Never true   Transportation needs    Medical: No    Non-medical: No  Tobacco Use   Smoking status: Current Every Day Smoker    Packs/day: 0.00    Years: 37.00  Pack years: 0.00    Types: Cigarettes, E-cigarettes    Last attempt to quit: 03/29/2014    Years since quitting: 5.0   Smokeless tobacco: Never Used   Tobacco comment: 5-7 cigs daily 06/07/17  Substance and Sexual Activity   Alcohol use: No   Drug use: No   Sexual activity: Not Currently  Lifestyle   Physical activity    Days per week: Patient refused    Minutes per session: Patient refused   Stress: Patient refused  Relationships   Social connections    Talks on phone: Patient refused    Gets together: Patient refused    Attends religious service: Patient refused    Active member of club or organization: Patient refused    Attends meetings of clubs or organizations: Patient refused    Relationship status: Patient refused  Other Topics Concern   Not on file  Social History Narrative   Patient lives at home with her husband Stacy Fernandez) Patient works full time.   Education- GED   Right handed.   Caffeine- one cup of coffee daily.     Family History:  The patient's family history includes Breast cancer (age of onset: 29) in her paternal aunt; Cancer in her paternal grandfather; Colon cancer (age of onset: 80) in her paternal grandmother; Colon cancer (age of onset: 58) in her father; Heart attack in her maternal grandmother; Heart failure in her mother; Ovarian cancer (age of onset: 56) in her sister; Ovarian cancer (age of onset: 65) in an other family member; Throat cancer (age of onset: 53) in her brother; Throat cancer (age of onset: 18) in her brother.   ROS General: Negative; No fevers, chills, or night sweats;  HEENT: Negative; No changes in vision or hearing, sinus  congestion, difficulty swallowing Pulmonary: Negative; No cough, wheezing, shortness of breath, hemoptysis Cardiovascular: See HPI GI: Negative; No nausea, vomiting, diarrhea, or abdominal pain GU: Negative; No dysuria, hematuria, or difficulty voiding Musculoskeletal: Fall resulting in rib fracture and fracture fibula Hematologic/Oncology: History of breast CA Endocrine: Negative; no heat/cold intolerance; no diabetes Neuro: Negative; no changes in balance, headaches Skin: Negative; No rashes or skin lesions Psychiatric: Negative; No behavioral problems, depression Sleep: Negative; No snoring, daytime sleepiness, hypersomnolence, bruxism, restless legs, hypnogognic hallucinations, no cataplexy Other comprehensive 14 point system review is negative.   PHYSICAL EXAM:   VS:  BP 100/62    Pulse 75    Ht 5' 6.5" (1.689 m)    Wt 165 lb (74.8 kg)    SpO2 99%    BMI 26.23 kg/m     Repeat blood pressure by me was 110/70 without significant orthostatic change.  Wt Readings from Last 3 Encounters:  04/17/19 165 lb (74.8 kg)  04/07/19 162 lb (73.5 kg)  02/08/19 162 lb 9.6 oz (73.8 kg)    General: Alert, oriented, no distress.  Skin: normal turgor, no rashes, warm and dry HEENT: Normocephalic, atraumatic. Pupils equal round and reactive to light; sclera anicteric; extraocular muscles intact;  Nose without nasal septal hypertrophy Mouth/Parynx benign; Mallinpatti scale, previously a 2, not assessed today since wearing a mask Neck: No JVD, no carotid bruits; normal carotid upstroke Lungs: clear to ausculatation and percussion; no wheezing or rales Chest wall: without tenderness to palpitation Heart: PMI not displaced, RRR, s1 s2 normal, 1/6 systolic murmur, no diastolic murmur, no rubs, gallops, thrills, or heaves Abdomen: soft, nontender; no hepatosplenomehaly, BS+; abdominal aorta nontender and not dilated by palpation. Back: no CVA tenderness Pulses 2+  Musculoskeletal: full range of  motion, normal strength, no joint deformities Extremities: no clubbing cyanosis or edema, Homan's sign negative  Neurologic: grossly nonfocal; Cranial nerves grossly wnl Psychologic: Normal mood and affect   Studies/Labs Reviewed:   ECG (independently read by me): Normal sinus rhythm at 75 beats per minute.  New mild RV conduction delay V1 V2 with T wave inversion and new T wave inversion in lead V3 and V4.  Normal intervals.  No ectopy  October 29, 2018 ECG (independently read by me): Normal sinus rhythm at 74 bpm.  No ectopy.  Normal intervals.  No ST segment changes.  Recent Labs: BMP Latest Ref Rng & Units 03/24/2019 01/16/2019 10/29/2018  Glucose 65 - 99 mg/dL 90 79 88  BUN 6 - 24 mg/dL _0 Creatinine 0.57 - 1.00 mg/dL 0.81 0.88 0.65  BUN/Creat Ratio 9 - _1 -  Sodium 134 - 144 mmol/L 143 142 140  Potassium 3.5 - 5.2 mmol/L 4.7 5.1 4.4  Chloride 96 - 106 mmol/L 106 105 106  CO2 20 - 29 mmol/L _2 Calcium 8.7 - 10.2 mg/dL 9.6 9.5 8.7(L)     Hepatic Function Latest Ref Rng & Units 03/24/2019 01/16/2019 10/29/2018  Total Protein 6.0 - 8.5 g/dL 7.0 6.7 6.3(L)  Albumin 3.8 - 4.9 g/dL 4.6 4.4 3.1(L)  AST 0 - 40 IU/L 20 15 52(H)  ALT 0 - 32 IU/L 16 16 37  Alk Phosphatase 39 - 117 IU/L 126(H) 131(H) 75  Total Bilirubin 0.0 - 1.2 mg/dL 0.5 0.3 0.5    CBC Latest Ref Rng & Units 01/16/2019 10/29/2018 10/28/2018  WBC 3.4 - 10.8 x10E3/uL 4.7 4.0 3.2(L)  Hemoglobin 11.1 - 15.9 g/dL 12.7 10.7(L) 9.9(L)  Hematocrit 34.0 - 46.6 % 37.6 31.3(L) 29.0(L)  Platelets 150 - 450 x10E3/uL 244 283 243   Lab Results  Component Value Date   MCV 92 01/16/2019   MCV 94.6 10/29/2018   MCV 93.2 10/28/2018   Lab Results  Component Value Date   TSH 4.610 (H) 01/16/2019   Lab Results  Component Value Date   HGBA1C 5.0 12/26/2012     BNP No results found for: BNP  ProBNP    Component Value Date/Time   PROBNP 326.1 (H) 12/26/2012 0553     Lipid Panel     Component Value  Date/Time   CHOL 132 03/24/2019 0842   TRIG 228 (H) 03/24/2019 0842   HDL 40 03/24/2019 0842   CHOLHDL 3.3 03/24/2019 0842   CHOLHDL 4.4 12/26/2012 0340   VLDL 17 12/26/2012 0340   LDLCALC 55 03/24/2019 0842     RADIOLOGY: Xr Ribs Unilateral Right  Result Date: 04/07/2019 Rib detail films are obtained on the right.  There is a possibility of a nondisplaced fracture of the fourth or fifth rib right lung is expanded.  No acute injuries to right clavicle or shoulder    Additional studies/ records that were reviewed today include:  ECHO 02/04/2015 Study Conclusions  - Left ventricle: The cavity size was normal. Systolic function was normal. The estimated ejection fraction was in the range of 50% to 55%. Wall motion was normal; there were no regional wall motion abnormalities. Doppler parameters are consistent with abnormal left ventricular relaxation (grade 1 diastolic dysfunction). There was no evidence of elevated ventricular filling pressure by Doppler parameters. - Aortic valve: Trileaflet; normal thickness leaflets. Transvalvular velocity was within the normal range. There was no stenosis. There was no regurgitation. - Aortic  root: The aortic root was normal in size. - Mitral valve: Structurally normal valve. There was no regurgitation. - Left atrium: The atrium was normal in size. - Right ventricle: The cavity size was normal. Wall thickness was normal. Systolic function was normal. - Right atrium: The atrium was normal in size. - Tricuspid valve: There was trivial regurgitation. - Pulmonary arteries: Systolic pressure was within the normal range. - Inferior vena cava: The vessel was normal in size. - Pericardium, extracardiac: There was no pericardial effusion.   ECHO IMPRESSIONS 01/09/2019  1. The left ventricle has normal systolic function with an ejection fraction of 60-65%. The cavity size was normal. Left ventricular diastolic parameters were  normal.  2. The right ventricle has normal systolic function. The cavity was normal. There is no increase in right ventricular wall thickness.  3. Mild thickening of the mitral valve leaflet.  4. The aortic valve is tricuspid. Mild thickening of the aortic valve. Mild calcification of the aortic valve.   Cardiac CT IMPRESSION:02/02/2019  1. Coronary calcium score of 514. This was 99th percentile for age and sex matched control.  2. Normal coronary origin with right dominance.  3. Moderate calcified plaque in the proximal LAD with 50-69%% stenosis. CAD RAD 3.  4. Moderate calcified plaque in the proximal OM with 50-69% stenosis. CAD RAD 3  5. FFR has been sent to evaluate LAD and OM.  6. Recommend Aggressive Risk Factor Modification.  CT FFR ANALYSIS FINDINGS: FFRct analysis was performed on the original cardiac CT angiogram dataset. Diagrammatic representation of the FFRct analysis is provided in a separate PDF document in PACS. This dictation was created using the PDF document and an interactive 3D model of the results. 3D model is not available in the EMR/PACS. Normal FFR range is >0.80.  1. Left Main: No significant stenosis.  FFR LM = 0.99  2. LAD: No significant stenosis. Proximal FFR = 0.98, Mid FFR = 0.84, Distal FFR = 0.75. 3. LCX: No significant stenosis. Proximal FFR = 0.98, Mid FFR = 0.96, Distal FFR = 0.93. 4. RCA: No significant stenosis. Proximal FFR = 0.99, Mid FFR = 0.95, Distal FFR = 0.91. PDA not assessed.  IMPRESSION: 1. CT FFR analysis showed a borderline hemodynamically significant reduction in flow in the distal LAD. This represents either diffuse distal disease or distal tapering of the vessel.  2. Recommend medical management and aggressive risk factor modification.   ASSESSMENT:    1. Coronary atherosclerosis due to calcified coronary lesion   2. Chest tightness   3. Palpitations   4. Hyperlipidemia with target LDL less than 70     5. Pre-op testing   6. Medication management   7. Tobacco abuse   8. History of 2019 novel coronavirus disease (COVID-19)   9. History of breast cancer      PLAN:   1.  Chest pain/CAD: Cardiac CT was reviewed with the patient.  Calcium score is elevated at 514 and there is moderate calcified plaque of 50 to 69% in the proximal LAD and obtuse marginal vessel.  FFR analysis was essentially normal with the exception of her distal LAD which could represent tapering of the vessel.  Initially made a blocker therapy was added to her regimen.  Over the past 4 weeks, she has noticed several episodes of substernal chest pressure described as a "brick sitting on my chest." Her ECG today demonstrates new T wave inversion particularly in V3 and V4 and she has developed a mild RV conduction  delay.  With her ECG changes as well as change in symptomatology despite the addition of beta-blocker therapy, I have recommended definitive evaluation with cardiac catheterization.  I am initiating Imdur 30 mg.  She works the third shift from 11 PM until 7 AM and she will take her Imdur in the morning when she goes to bed and will continue to take her metoprolol 37.5 mg with 12.5 mg in the morning and 25 mg at night.  She will need to undergo repeat Covid testing prior to the elective procedure.  We will schedule this to be done next week. The risks and benefits of a cardiac catheterization including, but not limited to, death, stroke, MI, kidney damage and bleeding were discussed with the patient who indicates understanding and agrees to proceed.  I discussed that if she had increasing symptomatology despite increased medical therapy she is to contact our office.  2.  History of COVID-19 requiring 1 week hospitalization: Her symptoms began on April 1.  She states her peak temperature was 104.  Her husband had developed COVID several days prior to her and he had been hospitalized for 13 days.  She received a 5-day course of  hydroxychloroquine.  COVID test 2 weeks after discharge was still positive and ultimately in mid-May COVID test was negative.  She has been demonstrated to have a strong antibody response.  Repeat COVID testing will be necessary for her scheduled elective cardiac catheterization.  3.  Palpitations: Her echo Doppler study which showed normal systolic and diastolic function.  She admits to noticing her heart rate speeding up particularly with housework.  Her two-week monitor showed predominantly sinus rhythm without ectopy with fastest heart rate being sinus tachycardia at 128 bpm and slowest heart rate in the upper 50s while sleeping.  Symptoms have improved with initiation of low-dose metoprolol at 12.5 mg twice a day.  I last saw her since she continued to experience palpitations and with her documented plaque I further titrated her metoprolol tartrate.  Her blood pressure has been low which has limited aggressive titration.   4.  Hyperlipidemia: Laboratory revealed total cholesterol 202, triglycerides 228 VLDL 46 HDL 39 LDL 117.  She had been on rosuvastatin 10 mg and when last seen recommended recommended further titration to 20 mg.  We will repeat chemistry and lipid studies in 2 to 3 months.  5.  Hypothyroidism: Most recent TSH 4.61 on her reduced dose of levothyroxine at 125 mcg.  6.  Tobacco history.  Unfortunately she still been smoking several cigarettes per day.  I strongly recommended complete smoking cessation.  Tickly with her documented CAD on CT imaging and her new T wave abnormalities I discussed potential for smoking contributing to acute plaque rupture with subsequent ACS.    7.  History of breast CA: diagnosed in 2015, followed by Dr. Jana Hakim   Medication Adjustments/Labs and Tests Ordered: Current medicines are reviewed at length with the patient today.  Concerns regarding medicines are outlined above.  Medication changes, Labs and Tests ordered today are listed in the Patient  Instructions below. Patient Instructions  Medication Instructions:  - START ISOSORBIDE MONO 26 M G IN THE MORNING TIME.  If you need a refill on your cardiac medications before your next appointment, please call your pharmacy.   Lab work: CBC  BMP  If you have labs (blood work) drawn today and your tests are completely normal, you will receive your results only by:  [place] (if you have MyChart) OR  A paper copy in the mail If you have any lab test that is abnormal or we need to change your treatment, we will call you to review the results.  Testing/Procedures:  WILL BE SCHEDULE AT [facility]  OCT [phone]  WILL DO COVID TEST Tuesday  OCT 6 ,2020 - THEN SELF ISOLATE UNTIL Apr 28, 2019. Your physician has requested that you have a cardiac catheterization. Cardiac catheterization is used to diagnose and/or treat various heart conditions. Doctors may recommend this procedure for a number of different reasons. The most common reason is to evaluate chest pain. Chest pain can be a symptom of coronary artery disease (CAD), and cardiac catheterization can show whether plaque is narrowing or blocking your hearts arteries. This procedure is also used to evaluate the valves, as well as measure the blood flow and oxygen levels in different parts of your heart. For further information please visit [email or web address]. Please follow instruction sheet, as given.   Follow-Up: At [facility], you and your health needs are our priority.  As part of our continuing mission to provide you with exceptional heart care, we have created designated Provider Care Teams.  These Care Teams include your primary Cardiologist (physician) and Advanced Practice Providers (APPs -  Physician Assistants and Nurse Practitioners) who all work together to provide you with the care you need, when you need it.  Your physician recommends that you schedule a follow-up appointment in:     Any Other Special  Instructions Will Be Listed Below (If Applicable).      [place] [address] [place] [address] Dept: [phone] Loc: [place]  04/17/2019  You are scheduled for a Cardiac Catheterization on Friday, October 9 with Dr. Shelva Majestic.  1. Please arrive at the [facility] (Main Entrance A) at [facility]: [address] [place], [place] [address] at 5:30 AM (This time is two hours before your procedure to ensure your preparation). Free valet parking service is available.   Special note: Every effort is made to have your procedure done on time. Please understand that emergencies sometimes delay scheduled procedures.  2. Diet: Do not eat solid foods after midnight.  The patient may have clear liquids until 5am upon the day of the procedure.  3. Labs:CBC, BMP  BEFORE COVID TEST   You will need to have blood drawn on Tuesday, October 6 at [facility]  Open: 8am - 5pm (Lunch 12:30 - 1:30)   Phone: [phone]. You do not need to be fasting. WILL NEED TO HAVE COVID TEST AT [address]  AT 11:40 AM  SELF ISOLATE UNTIL CARDIAC CATH Apr 28, 2019  4. Medication instructions in preparation for your procedure:   On the morning of your procedure, take your Aspirin 81  and any morning medicines NOT listed above.  You may use sips of water.  5. Plan for one night stay--bring personal belongings. 6. Bring a current list of your medications and current insurance cards. 7. You MUST have a responsible person to drive you home. 8. Someone MUST be with you the first 24 hours after you arrive home or your discharge will be delayed. 9. Please wear clothes that are easy to get on and off and wear slip-on shoes.  Thank you for allowing [place] to care for you!   -- [facility]  Signed, Shelva Majestic, MD  04/18/2019 8:02 AM    [place] [address], [place], [place], [place]  [address] Phone: [phone]

## 2019-04-17 NOTE — H&P (View-Only) (Signed)
Cardiology Office Note    Date:  04/18/2019   ID:  Stacy Fernandez, DOB Nov 16, 1960, MRN [id number]  PCP:  Jettie Booze, NP  Cardiologist:  Shelva Majestic, MD    History of Present Illness:  Stacy Fernandez is a 58 y.o. female  who presents to the office for 3-monthfollow-up cardiology evaluation.    I  Initially saw Stacy Fernandez 2014 for evaluation of chest pain.  She has a history of tobacco use, migraine headaches as well as COPD/emphysema.  In October 2013 a nuclear perfusion study showed normal perfusion and function.  She also had a history of GERD with reflux and had undergone esophageal manometry.  [facility]  She underwent diagnostic cardiac catheterization by Dr. BGwenlyn Fernandez December 25, 2012 which showed 40% ostial first diagonal branch stenosis of the LAD; all other vessels were normal.  EF was 60%.She had continued to smoke cigarettes and I saw her in follow-up of her hospitalization in September 2014.  She has a history of a history of breast CA and is followed by Dr. MJana Fernandez  Apparently earlier this year both she and her husband develop symptoms of COVID-19.  They believe that this may have been acquired in their church since 19 members tested positive and to subsequently died.  Her [facility] developed COVID [facility]  [facility] early April and was hospitalized for 1 week.  [facility] [place] [facility]  Subsequent testing 2 weeks later still revealed her to be COVID positive and it was not until a May test that she tested COVID negative.  Subsequent antibody testing revealed that she had a strong positive antibody response.  Due to a 2 to 4-week period of increasing palpitations as well as some vague discomfort in her chest described as a tightness she was referred for a telemedicine  evaluation with me on January 05, 2019.  During that evaluation she states that her levothyroxine dose had been reduced from 175 down to 125 mcg.  I recommended that she wear an event monitor for 2 weeks.  I initiated therapy with metoprolol tartrate initially at 12.5 mg twice a day.  Since I did not know her blood pressure I did not initiate at a higher dose.  At the time she was drinking 1 to 2 cups of coffee per day and discontinuance of all caffeine was recommended.  In light of her previous catheterization in 2014 which showed mild nonobstructive disease with her longstanding tobacco history I recommended she undergo CT coronary angiogram to further evaluate her chest tightness and assess for progressive CAD.  I also scheduled her for 2D echo Doppler study and follow-up chest x-ray.  We discussed the importance of complete smoking cessation.   Her 2D echo Doppler study was done on January 09, 2019 which showed an EF of 60 to 65%.  He had normal diastolic parameters.  There was mild aortic sclerosis and mild thickening of mitral valve leaflets.  Both atrium were normal in size.  A 2-week monitor from June 29 through January 29, 2019 was essentially unremarkable.  She was predominantly in sinus rhythm July 6 at 4:47 AM with a rate 56 and a maximum heart rate on January 28, 2019 at 7:49 PM at 126 bpm which was sinus tachycardia.  She underwent CT coronary angiography.  Calcium score was increased to 514 which was 99th percentile for age and sex matched control.  There is moderate calcified plaque in the proximal LAD with 50 to 69% stenosis as well as moderate calcified plaque in the proximal OM with 50 to 69% stenosis.  FFR was sent but results are still pending.  She has had issues with somewhat low blood pressure.  Laboratory from January 16, 2019 showed increased total cholesterol at 202, triglycerides 228, HDL 39, LDL 117.  Rosuvastatin 10 mg was initiated and she has been on therapy for approximately 3 weeks and  tolerating this well.    Since I last saw her, her FFR analysis was essentially negative with mean at 0.99, proximal LAD 0.98, mid LAD 0.84, however the distal LAD was 0.75.  The circumflex and RCA FFR's were all normal at 0.9 and above.  At that time, continued medical therapy was recommended.  Over the past 4 weeks, Stacy. Stokely has noticed nasal palpitations and also admits to episodes of chest pressure.  She had fallen 4 weeks ago and fractured her ribs and fibula.   Oftentimes her chest pressure may last 10 to 15 minutes and resolve spontaneously.  She presents for reevaluation.   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)--  s/p  bilateral mastectomy w/ reconstruction and chemotherapy (05-08-2014 to 04-30-2015)   Chemotherapy-induced peripheral neuropathy (HCC)    FEET   Chronic migraine    caused by brain aneurysm--  BOTOX TX'S  by dr Stacy Fernandez   CKD (chronic kidney disease), stage III ([place])    COPD with emphysema ([place]) pulmologist--  dr Elsworth Soho   advanded emphysema per CT    History of chemotherapy    left breast ca-- 05-09-2015   to 04-30-2015   History of gastroesophageal reflux (GERD)    no problems since Nissen fundoplication   Hypothyroidism    Rectovaginal fistula    Skin changes related to chemotherapy    ARMS   Supraclinoid carotid artery aneurysm, small neurologist-  dr willis/  dr Stacy Fernandez   left internal - 2 mm;  aneurysm  vs.  infundibulum--  residual chronic migraine   Wears dentures    upper   Wears glasses     Past Surgical History:  Procedure Laterality Date   APPENDECTOMY  1992   BREAST RECONSTRUCTION WITH PLACEMENT OF TISSUE EXPANDER AND FLEX HD (ACELLULAR HYDRATED DERMIS) Bilateral 09/25/2014   Procedure: PLACEMENT OF BILATERAL TISSUE EXPANDER AND  ACELLULAR DERMIS FOR BREAST RECONSTRUCTION  ;  Surgeon: [name], MD;  Location: [place];  Service: Plastics;  Laterality: Bilateral;   CHOLECYSTECTOMY  1999   ESOPHAGOGASTRODUODENOSCOPY (EGD) WITH PROPOFOL  01/18/2013   LAPAROSCOPIC NISSEN FUNDOPLICATION  [phone]   LEFT HEART CATHETERIZATION WITH CORONARY ANGIOGRAM N/A 12/26/2012   Procedure: LEFT HEART CATHETERIZATION WITH CORONARY ANGIOGRAM;  Surgeon: Lorretta Harp, MD;  Location[facility] CATH LAB;  Service: Cardiovascular;  Laterality: N/A;   Nonobstructive CAD/  40-50% ostial D1/  normal LVF, ef 60%   LIPOSUCTION WITH LIPOFILLING Bilateral 12/18/2014   Procedure: LIPOFILLING TO BILATERAL CHEST;  Surgeon: [name], MD;  Location: [place];  Service: Plastics;  Laterality: Bilateral;   PORT-A-CATH REMOVAL Right 06/28/2015   Procedure: REMOVAL PORT-A-CATH;  Surgeon: Leighton Ruff, MD;  Location[facility];  Service: General;  Laterality: Right;   PORTACATH PLACEMENT N/A 04/02/2014   Procedure: INSERTION PORT-A-CATH;  Surgeon: [name], MD;  Location: [place];  Service: General;  Laterality: N/A;   REMOVAL OF BILATERAL TISSUE EXPANDERS WITH PLACEMENT OF BILATERAL BREAST IMPLANTS Bilateral 12/18/2014   Procedure: REMOVAL OF BILATERAL TISSUE EXPANDERS,PLACEMENT SILICONE IMPLANTS ;  Surgeon: [name], MD;  Location: [place];  Service: Plastics;  Laterality: Bilateral;   SIMPLE MASTECTOMY WITH AXILLARY SENTINEL NODE BIOPSY Bilateral 04/02/2014   Procedure: LEFT TOTAL MASTECTOMY WITH LEFT AXILLARY SENTINEL NODE BIOPSY, RIGHT PROPHYLACTIC MASTETCTOMY;  Surgeon: [name], MD;  Location: [place];  Service: General;  Laterality: Bilateral;   TOTAL ABDOMINAL HYSTERECTOMY W/ BILATERAL SALPINGOOPHORECTOMY  1996   TRANSTHORACIC ECHOCARDIOGRAM  02-04-2015   grade 1 diastolic dysfunction, ef 78-24%/  trivial TR   VESICO-VAGINAL FISTULA REPAIR N/A 07/17/2015   Procedure: REPAIR OF RECTOVAGINAL  FISTULA;  Surgeon: [name], MD;  Location: [facility];  Service: Gynecology;  Laterality: N/A;   VIDEO BRONCHOSCOPY Bilateral 01/16/2013   Procedure: VIDEO BRONCHOSCOPY WITHOUT FLUORO;  Surgeon: [name], MD;  Location: [facility];  Service: Cardiopulmonary;  Laterality: Bilateral;    Current Medications: Outpatient Medications Prior to Visit  Medication Sig Dispense Refill   acetaminophen (TYLENOL) 325 MG tablet Take 2 tablets (650 mg total) by mouth every 6 (six) hours as needed for mild pain or headache (fever >/= 101).     aspirin EC 81 MG tablet Take 1 tablet (81 mg total) by mouth daily. 90 tablet 3   HYDROcodone-acetaminophen (NORCO/VICODIN) 5-325 MG tablet Take 1 tablet by mouth every 6 (six) hours as needed for moderate pain. 30 tablet 0   levothyroxine (SYNTHROID, LEVOTHROID) 125 MCG tablet Take 125 mcg by mouth daily before breakfast.     metoprolol tartrate (LOPRESSOR) 25 MG tablet Take 25 mg in the morning, and 12.5 mg in the evening. 90 tablet 1   montelukast (SINGULAIR) 10 MG tablet Take 10 mg by mouth at bedtime.      PROAIR HFA 108 (90 BASE) MCG/ACT inhaler Inhale 2 puffs into the lungs every 4 (four) hours as needed for wheezing or shortness of breath. Reported on 07/08/2015     rosuvastatin (CRESTOR) 20 MG tablet Take 1 tablet (20 mg total) by mouth daily. 90 tablet 3   umeclidinium-vilanterol (ANORO ELLIPTA) 62.5-25 MCG/INH AEPB Inhale 1 puff into the lungs daily. 1 each 11   vitamin C (VITAMIN C) 500 MG tablet Take 1 tablet (500 mg total) by mouth daily.     zinc sulfate 220 (50 Zn) MG capsule Take 1 capsule (220 mg total) by mouth daily.     No facility-administered medications prior to visit.      Allergies:   Prednisone   Social History   Socioeconomic History   Marital status: Married    Spouse name: Eddie    Number of children: 4   Years of education: GED   Highest education level: Not on file  Occupational History    Employer: [employer]  Social Designer, fashion/clothing strain: Not hard at all   Food insecurity    Worry: Never true    Inability: Never true   Transportation needs    Medical: No    Non-medical: No  Tobacco Use   Smoking status: Current Every Day Smoker    Packs/day: 0.00    Years: 37.00  Pack years: 0.00    Types: Cigarettes, E-cigarettes    Last attempt to quit: 03/29/2014    Years since quitting: 5.0   Smokeless tobacco: Never Used   Tobacco comment: 5-7 cigs daily 06/07/17  Substance and Sexual Activity   Alcohol use: No   Drug use: No   Sexual activity: Not Currently  Lifestyle   Physical activity    Days per week: Patient refused    Minutes per session: Patient refused   Stress: Patient refused  Relationships   Social connections    Talks on phone: Patient refused    Gets together: Patient refused    Attends religious service: Patient refused    Active member of club or organization: Patient refused    Attends meetings of clubs or organizations: Patient refused    Relationship status: Patient refused  Other Topics Concern   Not on file  Social History Narrative   Patient lives at home with her husband [name]) Patient works full time.   Education- GED   Right handed.   Caffeine- one cup of coffee daily.     Family History:  The patient's family history includes Breast cancer (age of onset: 29) in her paternal aunt; Cancer in her paternal grandfather; Colon cancer (age of onset: 80) in her paternal grandmother; Colon cancer (age of onset: 58) in her father; Heart attack in her maternal grandmother; Heart failure in her mother; Ovarian cancer (age of onset: 56) in her sister; Ovarian cancer (age of onset: 65) in an other family member; Throat cancer (age of onset: 53) in her brother; Throat cancer (age of onset: 18) in her brother.   ROS General: Negative; No fevers, chills, or night sweats;  HEENT: Negative; No changes in vision or hearing, sinus  congestion, difficulty swallowing Pulmonary: Negative; No cough, wheezing, shortness of breath, hemoptysis Cardiovascular: See HPI GI: Negative; No nausea, vomiting, diarrhea, or abdominal pain GU: Negative; No dysuria, hematuria, or difficulty voiding Musculoskeletal: Fall resulting in rib fracture and fracture fibula Hematologic/Oncology: History of breast CA Endocrine: Negative; no heat/cold intolerance; no diabetes Neuro: Negative; no changes in balance, headaches Skin: Negative; No rashes or skin lesions Psychiatric: Negative; No behavioral problems, depression Sleep: Negative; No snoring, daytime sleepiness, hypersomnolence, bruxism, restless legs, hypnogognic hallucinations, no cataplexy Other comprehensive 14 point system review is negative.   PHYSICAL EXAM:   VS:  BP 100/62    Pulse 75    Ht 5' 6.5" (1.689 m)    Wt 165 lb (74.8 kg)    SpO2 99%    BMI 26.23 kg/m     Repeat blood pressure by me was 110/70 without significant orthostatic change.  Wt Readings from Last 3 Encounters:  04/17/19 165 lb (74.8 kg)  04/07/19 162 lb (73.5 kg)  02/08/19 162 lb 9.6 oz (73.8 kg)    General: Alert, oriented, no distress.  Skin: normal turgor, no rashes, warm and dry HEENT: Normocephalic, atraumatic. Pupils equal round and reactive to light; sclera anicteric; extraocular muscles intact;  Nose without nasal septal hypertrophy Mouth/Parynx benign; Mallinpatti scale, previously a 2, not assessed today since wearing a mask Neck: No JVD, no carotid bruits; normal carotid upstroke Lungs: clear to ausculatation and percussion; no wheezing or rales Chest wall: without tenderness to palpitation Heart: PMI not displaced, RRR, s1 s2 normal, 1/6 systolic murmur, no diastolic murmur, no rubs, gallops, thrills, or heaves Abdomen: soft, nontender; no hepatosplenomehaly, BS+; abdominal aorta nontender and not dilated by palpation. Back: no CVA tenderness Pulses 2+  Musculoskeletal: full range of  motion, normal strength, no joint deformities Extremities: no clubbing cyanosis or edema, Homan's sign negative  Neurologic: grossly nonfocal; Cranial nerves grossly wnl Psychologic: Normal mood and affect   Studies/Labs Reviewed:   ECG (independently read by me): Normal sinus rhythm at 75 beats per minute.  New mild RV conduction delay V1 V2 with T wave inversion and new T wave inversion in lead V3 and V4.  Normal intervals.  No ectopy  October 29, 2018 ECG (independently read by me): Normal sinus rhythm at 74 bpm.  No ectopy.  Normal intervals.  No ST segment changes.  Recent Labs: BMP Latest Ref Rng & Units 03/24/2019 01/16/2019 10/29/2018  Glucose 65 - 99 mg/dL 90 79 88  BUN 6 - 24 mg/dL _0 Creatinine 0.57 - 1.00 mg/dL 0.81 0.88 0.65  BUN/Creat Ratio 9 - _1 -  Sodium 134 - 144 mmol/L 143 142 140  Potassium 3.5 - 5.2 mmol/L 4.7 5.1 4.4  Chloride 96 - 106 mmol/L 106 105 106  CO2 20 - 29 mmol/L _2 Calcium 8.7 - 10.2 mg/dL 9.6 9.5 8.7(L)     Hepatic Function Latest Ref Rng & Units 03/24/2019 01/16/2019 10/29/2018  Total Protein 6.0 - 8.5 g/dL 7.0 6.7 6.3(L)  Albumin 3.8 - 4.9 g/dL 4.6 4.4 3.1(L)  AST 0 - 40 IU/L 20 15 52(H)  ALT 0 - 32 IU/L 16 16 37  Alk Phosphatase 39 - 117 IU/L 126(H) 131(H) 75  Total Bilirubin 0.0 - 1.2 mg/dL 0.5 0.3 0.5    CBC Latest Ref Rng & Units 01/16/2019 10/29/2018 10/28/2018  WBC 3.4 - 10.8 x10E3/uL 4.7 4.0 3.2(L)  Hemoglobin 11.1 - 15.9 g/dL 12.7 10.7(L) 9.9(L)  Hematocrit 34.0 - 46.6 % 37.6 31.3(L) 29.0(L)  Platelets 150 - 450 x10E3/uL 244 283 243   Lab Results  Component Value Date   MCV 92 01/16/2019   MCV 94.6 10/29/2018   MCV 93.2 10/28/2018   Lab Results  Component Value Date   TSH 4.610 (H) 01/16/2019   Lab Results  Component Value Date   HGBA1C 5.0 12/26/2012     BNP No results found for: BNP  ProBNP    Component Value Date/Time   PROBNP 326.1 (H) 12/26/2012 0553     Lipid Panel     Component Value  Date/Time   CHOL 132 03/24/2019 0842   TRIG 228 (H) 03/24/2019 0842   HDL 40 03/24/2019 0842   CHOLHDL 3.3 03/24/2019 0842   CHOLHDL 4.4 12/26/2012 0340   VLDL 17 12/26/2012 0340   LDLCALC 55 03/24/2019 0842     RADIOLOGY: Xr Ribs Unilateral Right  Result Date: 04/07/2019 Rib detail films are obtained on the right.  There is a possibility of a nondisplaced fracture of the fourth or fifth rib right lung is expanded.  No acute injuries to right clavicle or shoulder    Additional studies/ records that were reviewed today include:  ECHO 02/04/2015 Study Conclusions  - Left ventricle: The cavity size was normal. Systolic function was normal. The estimated ejection fraction was in the range of 50% to 55%. Wall motion was normal; there were no regional wall motion abnormalities. Doppler parameters are consistent with abnormal left ventricular relaxation (grade 1 diastolic dysfunction). There was no evidence of elevated ventricular filling pressure by Doppler parameters. - Aortic valve: Trileaflet; normal thickness leaflets. Transvalvular velocity was within the normal range. There was no stenosis. There was no regurgitation. - Aortic  root: The aortic root was normal in size. - Mitral valve: Structurally normal valve. There was no regurgitation. - Left atrium: The atrium was normal in size. - Right ventricle: The cavity size was normal. Wall thickness was normal. Systolic function was normal. - Right atrium: The atrium was normal in size. - Tricuspid valve: There was trivial regurgitation. - Pulmonary arteries: Systolic pressure was within the normal range. - Inferior vena cava: The vessel was normal in size. - Pericardium, extracardiac: There was no pericardial effusion.   ECHO IMPRESSIONS 01/09/2019  1. The left ventricle has normal systolic function with an ejection fraction of 60-65%. The cavity size was normal. Left ventricular diastolic parameters were  normal.  2. The right ventricle has normal systolic function. The cavity was normal. There is no increase in right ventricular wall thickness.  3. Mild thickening of the mitral valve leaflet.  4. The aortic valve is tricuspid. Mild thickening of the aortic valve. Mild calcification of the aortic valve.   Cardiac CT IMPRESSION:02/02/2019  1. Coronary calcium score of 514. This was 99th percentile for age and sex matched control.  2. Normal coronary origin with right dominance.  3. Moderate calcified plaque in the proximal LAD with 50-69%% stenosis. CAD RAD 3.  4. Moderate calcified plaque in the proximal OM with 50-69% stenosis. CAD RAD 3  5. FFR has been sent to evaluate LAD and OM.  6. Recommend Aggressive Risk Factor Modification.  CT FFR ANALYSIS FINDINGS: FFRct analysis was performed on the original cardiac CT angiogram dataset. Diagrammatic representation of the FFRct analysis is provided in a separate PDF document in PACS. This dictation was created using the PDF document and an interactive 3D model of the results. 3D model is not available in the EMR/PACS. Normal FFR range is >0.80.  1. Left Main: No significant stenosis.  FFR LM = 0.99  2. LAD: No significant stenosis. Proximal FFR = 0.98, Mid FFR = 0.84, Distal FFR = 0.75. 3. LCX: No significant stenosis. Proximal FFR = 0.98, Mid FFR = 0.96, Distal FFR = 0.93. 4. RCA: No significant stenosis. Proximal FFR = 0.99, Mid FFR = 0.95, Distal FFR = 0.91. PDA not assessed.  IMPRESSION: 1. CT FFR analysis showed a borderline hemodynamically significant reduction in flow in the distal LAD. This represents either diffuse distal disease or distal tapering of the vessel.  2. Recommend medical management and aggressive risk factor modification.   ASSESSMENT:    1. Coronary atherosclerosis due to calcified coronary lesion   2. Chest tightness   3. Palpitations   4. Hyperlipidemia with target LDL less than 70     5. Pre-op testing   6. Medication management   7. Tobacco abuse   8. History of 2019 novel coronavirus disease (COVID-19)   9. History of breast cancer      PLAN:   1.  Chest pain/CAD: Cardiac CT was reviewed with the patient.  Calcium score is elevated at 514 and there is moderate calcified plaque of 50 to 69% in the proximal LAD and obtuse marginal vessel.  FFR analysis was essentially normal with the exception of her distal LAD which could represent tapering of the vessel.  Initially made a blocker therapy was added to her regimen.  Over the past 4 weeks, she has noticed several episodes of substernal chest pressure described as a "brick sitting on my chest." Her ECG today demonstrates new T wave inversion particularly in V3 and V4 and she has developed a mild RV conduction  delay.  With her ECG changes as well as change in symptomatology despite the addition of beta-blocker therapy, I have recommended definitive evaluation with cardiac catheterization.  I am initiating Imdur 30 mg.  She works the third shift from 11 PM until 7 AM and she will take her Imdur in the morning when she goes to bed and will continue to take her metoprolol 37.5 mg with 12.5 mg in the morning and 25 mg at night.  She will need to undergo repeat Covid testing prior to the elective procedure.  We will schedule this to be done next week. The risks and benefits of a cardiac catheterization including, but not limited to, death, stroke, MI, kidney damage and bleeding were discussed with the patient who indicates understanding and agrees to proceed.  I discussed that if she had increasing symptomatology despite increased medical therapy she is to contact our office.  2.  History of COVID-19 requiring 1 week hospitalization: Her symptoms began on April 1.  She states her peak temperature was 104.  Her husband had developed COVID several days prior to her and he had been hospitalized for 13 days.  She received a 5-day course of  hydroxychloroquine.  COVID test 2 weeks after discharge was still positive and ultimately in mid-May COVID test was negative.  She has been demonstrated to have a strong antibody response.  Repeat COVID testing will be necessary for her scheduled elective cardiac catheterization.  3.  Palpitations: Her echo Doppler study which showed normal systolic and diastolic function.  She admits to noticing her heart rate speeding up particularly with housework.  Her two-week monitor showed predominantly sinus rhythm without ectopy with fastest heart rate being sinus tachycardia at 128 bpm and slowest heart rate in the upper 50s while sleeping.  Symptoms have improved with initiation of low-dose metoprolol at 12.5 mg twice a day.  I last saw her since she continued to experience palpitations and with her documented plaque I further titrated her metoprolol tartrate.  Her blood pressure has been low which has limited aggressive titration.   4.  Hyperlipidemia: Laboratory revealed total cholesterol 202, triglycerides 228 VLDL 46 HDL 39 LDL 117.  She had been on rosuvastatin 10 mg and when last seen recommended recommended further titration to 20 mg.  We will repeat chemistry and lipid studies in 2 to 3 months.  5.  Hypothyroidism: Most recent TSH 4.61 on her reduced dose of levothyroxine at 125 mcg.  6.  Tobacco history.  Unfortunately she still been smoking several cigarettes per day.  I strongly recommended complete smoking cessation.  Tickly with her documented CAD on CT imaging and her new T wave abnormalities I discussed potential for smoking contributing to acute plaque rupture with subsequent ACS.    7.  History of breast CA: diagnosed in 2015, followed by Stacy Fernandez   Medication Adjustments/Labs and Tests Ordered: Current medicines are reviewed at length with the patient today.  Concerns regarding medicines are outlined above.  Medication changes, Labs and Tests ordered today are listed in the Patient  Instructions below. Patient Instructions  Medication Instructions:  - START ISOSORBIDE MONO 26 M G IN THE MORNING TIME.  If you need a refill on your cardiac medications before your next appointment, please call your pharmacy.   Lab work: CBC  BMP  If you have labs (blood work) drawn today and your tests are completely normal, you will receive your results only by:  [place] (if you have MyChart) OR  A paper copy in the mail If you have any lab test that is abnormal or we need to change your treatment, we will call you to review the results.  Testing/Procedures:  WILL BE SCHEDULE AT [facility]  OCT [phone]  WILL DO COVID TEST Tuesday  OCT 6 ,2020 - THEN SELF ISOLATE UNTIL Apr 28, 2019. Your physician has requested that you have a cardiac catheterization. Cardiac catheterization is used to diagnose and/or treat various heart conditions. Doctors may recommend this procedure for a number of different reasons. The most common reason is to evaluate chest pain. Chest pain can be a symptom of coronary artery disease (CAD), and cardiac catheterization can show whether plaque is narrowing or blocking your hearts arteries. This procedure is also used to evaluate the valves, as well as measure the blood flow and oxygen levels in different parts of your heart. For further information please visit HugeFiesta.[email or web address]. Please follow instruction sheet, as given.   Follow-Up: At [facility], you and your health needs are our priority.  As part of our continuing mission to provide you with exceptional heart care, we have created designated Provider Care Teams.  These Care Teams include your primary Cardiologist (physician) and Advanced Practice Providers (APPs -  Physician Assistants and Nurse Practitioners) who all work together to provide you with the care you need, when you need it.  Your physician recommends that you schedule a follow-up appointment in:     Any Other Special  Instructions Will Be Listed Below (If Applicable).      [place] [address] Dept: [phone] Loc: [place]  04/17/2019  You are scheduled for a Cardiac Catheterization on Friday, October 9 with Dr. Shelva Majestic.  1. Please arrive at the [facility] (Main Entrance A) at [facility]: [address] [place], [place] [address] at 5:30 AM (This time is two hours before your procedure to ensure your preparation). Free valet parking service is available.   Special note: Every effort is made to have your procedure done on time. Please understand that emergencies sometimes delay scheduled procedures.  2. Diet: Do not eat solid foods after midnight.  The patient may have clear liquids until 5am upon the day of the procedure.  3. Labs:CBC, BMP  BEFORE COVID TEST   You will need to have blood drawn on Tuesday, October 6 at [place]  Open: 8am - 5pm (Lunch 12:30 - 1:30)   Phone: [phone]. You do not need to be fasting. WILL NEED TO HAVE COVID TEST AT [address]  AT 11:40 AM  SELF ISOLATE UNTIL CARDIAC CATH Apr 28, 2019  4. Medication instructions in preparation for your procedure:   On the morning of your procedure, take your Aspirin 81  and any morning medicines NOT listed above.  You may use sips of water.  5. Plan for one night stay--bring personal belongings. 6. Bring a current list of your medications and current insurance cards. 7. You MUST have a responsible person to drive you home. 8. Someone MUST be with you the first 24 hours after you arrive home or your discharge will be delayed. 9. Please wear clothes that are easy to get on and off and wear slip-on shoes.  Thank you for allowing [place] to care for you!   -- [place] [facility]  Signed, Shelva Majestic, MD  04/18/2019 8:02 AM    [place] [address], [place], [place], [place]  [address] Phone: [phone]

## 2019-04-17 NOTE — Patient Instructions (Addendum)
Medication Instructions:  - START ISOSORBIDE MONO 9 M G IN THE MORNING TIME.  If you need a refill on your cardiac medications before your next appointment, please call your pharmacy.   Lab work: CBC  BMP  If you have labs (blood work) drawn today and your tests are completely normal, you will receive your results only by: Marland Kitchen MyChart Message (if you have MyChart) OR . A paper copy in the mail If you have any lab test that is abnormal or we need to change your treatment, we will call you to review the results.  Testing/Procedures:  WILL BE SCHEDULE AT [facility]  OCT [phone]  WILL DO COVID TEST Tuesday  OCT 6 ,2020 - THEN SELF ISOLATE UNTIL Apr 28, 2019. Your physician has requested that you have a cardiac catheterization. Cardiac catheterization is used to diagnose and/or treat various heart conditions. Doctors may recommend this procedure for a number of different reasons. The most common reason is to evaluate chest pain. Chest pain can be a symptom of coronary artery disease (CAD), and cardiac catheterization can show whether plaque is narrowing or blocking your heart's arteries. This procedure is also used to evaluate the valves, as well as measure the blood flow and oxygen levels in different parts of your heart. For further information please visit [email or web address]. Please follow instruction sheet, as given.   Follow-Up: At [facility], you and your health needs are our priority.  As part of our continuing mission to provide you with exceptional heart care, we have created designated Provider Care Teams.  These Care Teams include your primary Cardiologist (physician) and Advanced Practice Providers (APPs -  Physician Assistants and Nurse Practitioners) who all work together to provide you with the care you need, when you need it. . Your physician recommends that you schedule a follow-up appointment in:  Marland Kitchen   Any Other Special Instructions Will Be Listed Below (If Applicable).       [place] [address] Dept: [phone] Loc: [place]  04/17/2019  You are scheduled for a Cardiac Catheterization on Friday, October 9 with Dr. Shelva Majestic.  1. Please arrive at the [facility] (Main Entrance A) at [facility]: [address] [place], [place] [address] at 5:30 AM (This time is two hours before your procedure to ensure your preparation). Free valet parking service is available.   Special note: Every effort is made to have your procedure done on time. Please understand that emergencies sometimes delay scheduled procedures.  2. Diet: Do not eat solid foods after midnight.  The patient may have clear liquids until 5am upon the day of the procedure.  3. Labs:CBC, BMP  BEFORE COVID TEST   You will need to have blood drawn on Tuesday, October 6 at [place]  Open: 8am - 5pm (Lunch 12:30 - 1:30)   Phone: [phone]. You do not need to be fasting. WILL NEED TO HAVE COVID TEST AT [address]  AT 11:40 AM  SELF ISOLATE UNTIL CARDIAC CATH Apr 28, 2019  4. Medication instructions in preparation for your procedure:   On the morning of your procedure, take your Aspirin 81  and any morning medicines NOT listed above.  You may use sips of water.  5. Plan for one night stay--bring personal belongings. 6. Bring a current list of your medications and current insurance  cards. 7. You MUST have a responsible person to drive you home. 8. Someone MUST be with you the first 24 hours after you arrive home or your discharge will be delayed. 9. Please wear clothes that are easy to get on and off and wear slip-on shoes.  Thank you for allowing [place] to care for you!   -- [place] [facility]

## 2019-04-18 ENCOUNTER — Encounter: Payer: Self-pay | Admitting: Cardiovascular Disease

## 2019-04-20 ENCOUNTER — Encounter: Payer: Self-pay | Admitting: Orthopaedic Surgery

## 2019-04-20 ENCOUNTER — Ambulatory Visit (INDEPENDENT_AMBULATORY_CARE_PROVIDER_SITE_OTHER): Payer: BC Managed Care – PPO | Admitting: Orthopaedic Surgery

## 2019-04-20 ENCOUNTER — Ambulatory Visit (HOSPITAL_COMMUNITY)
Admission: RE | Admit: 2019-04-20 | Discharge: 2019-04-20 | Disposition: A | Discharge status: Home / Self Care | Payer: BC Managed Care – PPO | Source: Ambulatory Visit | Attending: Family | Admitting: Family

## 2019-04-20 ENCOUNTER — Ambulatory Visit: Payer: Self-pay

## 2019-04-20 ENCOUNTER — Other Ambulatory Visit: Payer: Self-pay

## 2019-04-20 VITALS — BP 113/75 | HR 89 | Ht 66.5 in | Wt 165.0 lb

## 2019-04-20 DIAGNOSIS — M79661 Pain in right lower leg: Secondary | ICD-10-CM | POA: Insufficient documentation

## 2019-04-20 DIAGNOSIS — Z8781 Personal history of (healed) traumatic fracture: Secondary | ICD-10-CM | POA: Diagnosis not present

## 2019-04-20 NOTE — Progress Notes (Addendum)
Office Visit Note   Patient: Stacy Fernandez           Date of Birth: October 07, 1960           MRN: [id number] Visit Date: 04/20/2019              Requested by: Jettie Booze, NP [place],   [address] PCP: Jettie Booze, NP   Assessment & Plan: Visit Diagnoses:  1. History of fibula fracture   2. Right calf pain     Plan:  #1: STAT doppler right calf to R/O DVT Doppler was performed and was negative. We have suggested that she not go to work because is causing most of her symptoms but she has an upcoming heart cath and wants to work the rest of this week. She will use Voltaren gel to the knee.  Follow-Up Instructions: Return in about 2 weeks (around 05/04/2019).   Face-to-face time spent with patient was greater than 30 minutes.  Greater than 50% of the time was spent in counseling and coordination of care.   Orders:  Orders Placed This Encounter  Procedures  . XR Tibia/Fibula Right  . VAS [place] LOWER EXTREMITY VENOUS (DVT)   No orders of the defined types were placed in this encounter.     Procedures: No procedures performed   Clinical Data: No additional findings.   Subjective: Chief Complaint  Patient presents with  . Right Leg - Follow-up    DOI 03/26/2019 Right proximal fibula fracture   HPI Patient presents today for a two week follow up on her right leg. She has a right proximal fibula fracture, and is three weeks out from injury. She developed pain and swelling medially two days ago. She is taking hydrocodone as needed. She is unable to wear the knee immobilizer due to pain.  Stacy Fernandez is seen today for evaluation of her right leg.  She has been treated for a possible fibular fracture proximally.  She states that she as per our request removed the boot and she was doing well.  She continues to work and started having over the last 3 or 4 days of pain in her calf.  Is worse now over the past 2 days.  She is having to use hydrocodone for  her pain.  She is unable to wear the knee immobilizer due to her pain in her calf.  She is not particularly painful by history in her knee.  She is scheduled in the near future for a cardiac cath by Dr. Shelva Majestic.   Review of Systems  Constitutional: Negative for fatigue.  HENT: Negative for ear pain.   Eyes: Negative for pain.  Respiratory: Positive for shortness of breath.   Cardiovascular: Negative for leg swelling.  Gastrointestinal: Negative for constipation and diarrhea.  Endocrine: Negative for cold intolerance and heat intolerance.  Genitourinary: Negative for difficulty urinating.  Musculoskeletal: Positive for joint swelling.  Skin: Negative for rash.  Allergic/Immunologic: Negative for food allergies.  Neurological: Negative for weakness.  Hematological: Does not bruise/bleed easily.  Psychiatric/Behavioral: Positive for sleep disturbance.     Objective: Vital Signs: BP 113/75   Pulse 89   Ht 5' 6.5" (1.689 m)   Wt 165 lb (74.8 kg)   BMI 26.23 kg/m   Physical Exam Constitutional:      Appearance: Normal appearance. She is well-developed and normal weight.  HENT:     Head: Normocephalic.  Eyes:     Pupils:  Pupils are equal, round, and reactive to light.  Pulmonary:     Effort: Pulmonary effort is normal.  Musculoskeletal:        General: Tenderness (CALF POSTERIORLY) present.  Skin:    General: Skin is warm and dry.  Neurological:     Mental Status: She is alert and oriented to person, place, and time.  Psychiatric:        Behavior: Behavior normal.     Ortho Exam  Exam today reveals tenderness to palpation in the midportion of the gastroc muscle.  There is a segment about 10 cm where she is tender.  She is not particularly painful in the popliteal space.  She does have a trace Knee effusion without warmth.  I can range her from full extension to 95 degrees.  Specialty Comments:  No specialty comments available.  Imaging: Xr Tibia/fibula Right   Result Date: 04/20/2019 4 view xray reveals no obvious callus formation. Some medial compartment joint space narrowing.    PMFS History: Current Outpatient Medications  Medication Sig Dispense Refill  . acetaminophen (TYLENOL) 325 MG tablet Take 2 tablets (650 mg total) by mouth every 6 (six) hours as needed for mild pain or headache (fever >/= 101).    Marland Kitchen aspirin EC 81 MG tablet Take 1 tablet (81 mg total) by mouth daily. 90 tablet 3  . HYDROcodone-acetaminophen (NORCO/VICODIN) 5-325 MG tablet Take 1 tablet by mouth every 6 (six) hours as needed for moderate pain. 30 tablet 0  . isosorbide mononitrate (IMDUR) 30 MG 24 hr tablet Take 1 tablet (30 mg total) by mouth daily. 30 tablet 6  . levothyroxine (SYNTHROID, LEVOTHROID) 125 MCG tablet Take 125 mcg by mouth daily before breakfast.    . metoprolol tartrate (LOPRESSOR) 25 MG tablet Take 25 mg in the morning, and 12.5 mg in the evening. 90 tablet 1  . montelukast (SINGULAIR) 10 MG tablet Take 10 mg by mouth at bedtime.     Marland Kitchen PROAIR HFA 108 (90 BASE) MCG/ACT inhaler Inhale 2 puffs into the lungs every 4 (four) hours as needed for wheezing or shortness of breath. Reported on 07/08/2015    . rosuvastatin (CRESTOR) 20 MG tablet Take 1 tablet (20 mg total) by mouth daily. 90 tablet 3  . umeclidinium-vilanterol (ANORO ELLIPTA) 62.5-25 MCG/INH AEPB Inhale 1 puff into the lungs daily. 1 each 11  . vitamin C (VITAMIN C) 500 MG tablet Take 1 tablet (500 mg total) by mouth daily.    Marland Kitchen zinc sulfate 220 (50 Zn) MG capsule Take 1 capsule (220 mg total) by mouth daily.     No current facility-administered medications for this visit.     Patient Active Problem List   Diagnosis Date Noted  . Right calf pain 04/20/2019  . History of fibula fracture 04/07/2019  . Suspected COVID-19 virus infection 10/24/2018  . Vertigo 11/30/2016  . 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 Surgery follow-up 07/17/2015  . Genetic testing 06/03/2015  . Pain in joint, pelvic  region and thigh 04/30/2015  . Brain aneurysm 04/25/2015  . Back pain 04/19/2015  . Renal insufficiency 04/19/2015  . Lung nodule 12/11/2014  . Ankle edema 10/02/2014  . Acquired absence of breast and nipple 09/25/2014  . Neuropathy due to chemotherapeutic drug ([place]) 08/07/2014  . Chemotherapy induced neutropenia ([place]) 08/07/2014  . Insomnia 07/31/2014  . 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 Atherosclerosis 07/17/2014  . Irregular bowel habits 06/27/2014  . Mucositis due to chemotherapy 06/05/2014  . Diarrhea 05/29/2014  .  Pain at surgical site 05/07/2014  . Family history of malignant neoplasm of gastrointestinal tract 03/07/2014  . Family history of malignant neoplasm of breast 03/07/2014  . Family history of malignant neoplasm of ovary 03/07/2014  . Malignant neoplasm of upper-outer quadrant of left breast in female, estrogen receptor negative ([place]) 02/26/2014  . CAD (coronary artery disease) 04/16/2013  . Intractable migraine without aura 02/16/2013  . COPD exacerbation ([place]) 02/01/2013  . S/P Nissen fundoplication (without gastrostomy tube) procedure 01/05/2013  . COPD (chronic obstructive pulmonary disease) ([place]) 01/03/2013  . Chest pain 12/25/2012  . Hypothyroidism 12/25/2012  . Tobacco abuse 12/25/2012   Past Medical History:  Diagnosis Date  . Anxiety   . 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 Chemotherapy-induced peripheral neuropathy (HCC)    FEET  . 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 CKD (chronic kidney disease), stage III   . 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 History of chemotherapy    left breast ca-- 05-09-2015   to 04-30-2015  . History of gastroesophageal  reflux (GERD)    no problems since Nissen fundoplication  . Hypothyroidism   . Rectovaginal fistula   . Skin changes related to chemotherapy    ARMS  . Supraclinoid carotid artery aneurysm, small neurologist-  dr willis/  dr Krista Blue   left internal - 2 mm;  aneurysm  vs.  infundibulum--  residual chronic migraine  . Wears dentures    upper  . Wears glasses     Family History  Problem Relation Age of Onset  . Heart failure Mother   . Colon cancer Father 46       stomach cancer also in 18s  . Throat cancer Brother 70       smoker  . Heart attack Maternal Grandmother   . Colon cancer Paternal Grandmother 3  . Cancer Paternal Grandfather        kidney and bladder  . Ovarian cancer Sister 29       ovarian cancer at 33, colorectal cancer at 3  . Throat cancer Brother 37       throat cancer, smoker  . Breast cancer Paternal Aunt 53  . Ovarian cancer Other 101       niece with ovarian cancer    Past Surgical History:  Procedure Laterality Date  . APPENDECTOMY  1992  . BREAST RECONSTRUCTION WITH PLACEMENT OF TISSUE EXPANDER AND FLEX HD (ACELLULAR HYDRATED DERMIS) Bilateral 09/25/2014   Procedure: PLACEMENT OF BILATERAL TISSUE EXPANDER AND  ACELLULAR DERMIS FOR BREAST RECONSTRUCTION ;  Surgeon: [name], MD;  Location: [place];  Service: Plastics;  Laterality: Bilateral;  . CHOLECYSTECTOMY  1999  . ESOPHAGOGASTRODUODENOSCOPY (EGD) WITH PROPOFOL  01/18/2013  . LAPAROSCOPIC NISSEN FUNDOPLICATION  [id number]  . LEFT HEART CATHETERIZATION WITH CORONARY ANGIOGRAM N/A 12/26/2012   Procedure: LEFT HEART CATHETERIZATION WITH CORONARY ANGIOGRAM;  Surgeon: Lorretta Harp, MD;  Location: [facility] CATH LAB;  Service: Cardiovascular;  Laterality: N/A;   Nonobstructive CAD/  40-50% ostial D1/  normal LVF, ef 60%  . LIPOSUCTION WITH LIPOFILLING Bilateral 12/18/2014   Procedure: LIPOFILLING TO BILATERAL CHEST;  Surgeon: [name],  MD;  Location: [place];  Service:  Plastics;  Laterality: Bilateral;  . PORT-A-CATH REMOVAL Right 06/28/2015   Procedure: REMOVAL PORT-A-CATH;  Surgeon: Leighton Ruff, MD;  Location: [facility];  Service: General;  Laterality: Right;  . PORTACATH PLACEMENT N/A 04/02/2014   Procedure: INSERTION PORT-A-CATH;  Surgeon: [name], MD;  Location: [place];  Service: General;  Laterality: N/A;  . REMOVAL OF BILATERAL TISSUE EXPANDERS WITH PLACEMENT OF BILATERAL BREAST IMPLANTS Bilateral 12/18/2014   Procedure: REMOVAL OF BILATERAL TISSUE EXPANDERS,PLACEMENT SILICONE IMPLANTS ;  Surgeon: [name], MD;  Location: [place];  Service: Plastics;  Laterality: Bilateral;  . SIMPLE MASTECTOMY WITH AXILLARY SENTINEL NODE BIOPSY Bilateral 04/02/2014   Procedure: LEFT TOTAL MASTECTOMY WITH LEFT AXILLARY SENTINEL NODE BIOPSY, RIGHT PROPHYLACTIC MASTETCTOMY;  Surgeon: [name], MD;  Location: [place];  Service: General;  Laterality: Bilateral;  . TOTAL ABDOMINAL HYSTERECTOMY W/ BILATERAL SALPINGOOPHORECTOMY  1996  . TRANSTHORACIC ECHOCARDIOGRAM  02-04-2015   grade 1 diastolic dysfunction, ef [id number]  trivial TR  . VESICO-VAGINAL FISTULA REPAIR N/A 07/17/2015   Procedure: REPAIR OF RECTOVAGINAL FISTULA;  Surgeon: [name], MD;  Location: [facility];  Service: Gynecology;  Laterality: N/A;  . VIDEO BRONCHOSCOPY Bilateral 01/16/2013   Procedure: VIDEO BRONCHOSCOPY WITHOUT FLUORO;  Surgeon: [name], MD;  Location: [facility];  Service: Cardiopulmonary;  Laterality: Bilateral;   Social History   Occupational History    Employer: [employer]  Tobacco Use  . Smoking status: Current Every Day Smoker    Packs/day: 0.00    Years: 37.00    Pack years: 0.00    Types: Cigarettes, E-cigarettes    Last attempt to quit: 03/29/2014    Years since quitting: 5.0  . Smokeless tobacco: Never Used  . Tobacco comment: 5-7 cigs daily 06/07/17  Substance and Sexual Activity  . Alcohol use: No  . Drug use:  No  . Sexual activity: Not Currently

## 2019-04-25 ENCOUNTER — Other Ambulatory Visit (HOSPITAL_COMMUNITY)
Admission: RE | Admit: 2019-04-25 | Discharge: 2019-04-25 | Disposition: A | Discharge status: Home / Self Care | Payer: BC Managed Care – PPO | Source: Ambulatory Visit | Attending: Cardiovascular Disease | Admitting: Cardiovascular Disease

## 2019-04-25 DIAGNOSIS — R0789 Other chest pain: Secondary | ICD-10-CM | POA: Diagnosis not present

## 2019-04-25 DIAGNOSIS — R002 Palpitations: Secondary | ICD-10-CM | POA: Diagnosis not present

## 2019-04-25 DIAGNOSIS — I2510 Atherosclerotic heart disease of native coronary artery without angina pectoris: Secondary | ICD-10-CM | POA: Diagnosis not present

## 2019-04-25 DIAGNOSIS — Z01812 Encounter for preprocedural laboratory examination: Secondary | ICD-10-CM | POA: Diagnosis not present

## 2019-04-25 DIAGNOSIS — Z20828 Contact with and (suspected) exposure to other viral communicable diseases: Secondary | ICD-10-CM | POA: Insufficient documentation

## 2019-04-25 DIAGNOSIS — Z79899 Other long term (current) drug therapy: Secondary | ICD-10-CM | POA: Diagnosis not present

## 2019-04-25 LAB — CBC
Hematocrit: 35.8 % (ref 34.0–46.6)
Hemoglobin: 12.2 g/dL (ref 11.1–15.9)
MCH: 31.5 pg (ref 26.6–33.0)
MCHC: 34.1 g/dL (ref 31.5–35.7)
MCV: 93 fL (ref 79–97)
Platelets: 249 10*3/uL (ref 150–450)
RBC: 3.87 x10E6/uL (ref 3.77–5.28)
RDW: 12.7 % (ref 11.7–15.4)
WBC: 7.3 10*3/uL (ref 3.4–10.8)

## 2019-04-25 LAB — BASIC METABOLIC PANEL
BUN/Creatinine Ratio: 13 (ref 9–23)
BUN: 10 mg/dL (ref 6–24)
CO2: 22 mmol/L (ref 20–29)
Calcium: 10 mg/dL (ref 8.7–10.2)
Chloride: 105 mmol/L (ref 96–106)
Creatinine, Ser: 0.8 mg/dL (ref 0.57–1.00)
GFR calc Af Amer: 95 mL/min/{1.73_m2} (ref >59)
GFR calc non Af Amer: 82 mL/min/{1.73_m2} (ref >59)
Glucose: 90 mg/dL (ref 65–99)
Potassium: 4.5 mmol/L (ref 3.5–5.2)
Sodium: 141 mmol/L (ref 134–144)

## 2019-04-26 LAB — NOVEL CORONAVIRUS, NAA (HOSP ORDER, SEND-OUT TO REF LAB; TAT 18-24 HRS): SARS-CoV-2, NAA: NOT DETECTED

## 2019-04-27 ENCOUNTER — Telehealth: Payer: Self-pay | Admitting: *Deleted

## 2019-04-27 ENCOUNTER — Ambulatory Visit: Payer: BC Managed Care – PPO | Admitting: Orthopaedic Surgery

## 2019-04-27 NOTE — Telephone Encounter (Signed)
Pt contacted pre-catheterization scheduled at [facility] for: Friday April 28, 2019 7:30 AM Verified arrival time and place: [place] [facility]) at: 5:30 AM   No solid food after midnight prior to cath, clear liquids until 5 AM day of procedure. Contrast allergy: no  AM meds can be  taken pre-cath with sip of water including: ASA 81 mg   Confirmed patient has responsible adult to drive home post procedure and observe 24 hours after arriving home: yes  Currently, due to Covid-19 pandemic, only one support person will be allowed with patient. Must be the same support person for that patient's entire stay, will be screened and required to wear a mask. They will be asked to wait in the waiting room for the duration of the patient's stay.  Patients are required to wear a mask when they enter the hospital.      COVID-19 Pre-Screening Questions:  . In the past 7 to 10 days have you had a cough,  shortness of breath, headache, congestion, fever (100 or greater) body aches, chills, sore throat, or sudden loss of taste or sense of smell?  no . Have you been around anyone with known Covid 19? no . Have you been around anyone who is awaiting Covid 19 test results in the past 7 to 10 days? no . Have you been around anyone who has been exposed to Covid 19, or has mentioned symptoms of Covid 19 within the past 7 to 10 days? no   I reviewed procedure/mask/visitor instructions, Covid-19 screening questions with patient, she verbalized understanding, thanked me for call.

## 2019-04-28 ENCOUNTER — Encounter (HOSPITAL_COMMUNITY): Payer: Self-pay | Admitting: Cardiovascular Disease

## 2019-04-28 ENCOUNTER — Encounter (HOSPITAL_COMMUNITY)
Admission: RE | Disposition: A | Discharge status: Home / Self Care | Payer: Self-pay | Source: Home / Self Care | Attending: Cardiovascular Disease

## 2019-04-28 ENCOUNTER — Ambulatory Visit (HOSPITAL_COMMUNITY)
Admission: RE | Admit: 2019-04-28 | Discharge: 2019-04-28 | Disposition: A | Discharge status: Home / Self Care | Payer: BC Managed Care – PPO | Attending: Cardiovascular Disease | Admitting: Cardiovascular Disease

## 2019-04-28 ENCOUNTER — Other Ambulatory Visit: Payer: Self-pay

## 2019-04-28 DIAGNOSIS — Z853 Personal history of malignant neoplasm of breast: Secondary | ICD-10-CM | POA: Insufficient documentation

## 2019-04-28 DIAGNOSIS — E785 Hyperlipidemia, unspecified: Secondary | ICD-10-CM | POA: Insufficient documentation

## 2019-04-28 DIAGNOSIS — E039 Hypothyroidism, unspecified: Secondary | ICD-10-CM | POA: Diagnosis not present

## 2019-04-28 DIAGNOSIS — R002 Palpitations: Secondary | ICD-10-CM | POA: Diagnosis not present

## 2019-04-28 DIAGNOSIS — I2510 Atherosclerotic heart disease of native coronary artery without angina pectoris: Secondary | ICD-10-CM | POA: Insufficient documentation

## 2019-04-28 DIAGNOSIS — R079 Chest pain, unspecified: Secondary | ICD-10-CM | POA: Diagnosis not present

## 2019-04-28 DIAGNOSIS — F17210 Nicotine dependence, cigarettes, uncomplicated: Secondary | ICD-10-CM | POA: Insufficient documentation

## 2019-04-28 HISTORY — PX: LEFT HEART CATH AND CORONARY ANGIOGRAPHY: CATH118249

## 2019-04-28 SURGERY — LEFT HEART CATH AND CORONARY ANGIOGRAPHY
Anesthesia: LOCAL

## 2019-04-28 MED ORDER — SODIUM CHLORIDE 0.9 % IV SOLN: 250.0000 mL | INTRAVENOUS | Status: DC | PRN

## 2019-04-28 MED ORDER — HEPARIN (PORCINE) IN NACL 1000-0.9 UT/500ML-% IV SOLN
INTRAVENOUS | Status: DC | PRN
  Administered 2019-04-28 (×2): 500 mL

## 2019-04-28 MED ORDER — SODIUM CHLORIDE 0.9% FLUSH: 3.0000 mL | Freq: Two times a day (BID) | INTRAVENOUS | Status: DC

## 2019-04-28 MED ORDER — FENTANYL CITRATE (PF) 100 MCG/2ML IJ SOLN
INTRAMUSCULAR | Status: DC | PRN
  Administered 2019-04-28 (×2): 25 ug via INTRAVENOUS

## 2019-04-28 MED ORDER — MIDAZOLAM HCL 2 MG/2ML IJ SOLN
INTRAMUSCULAR | Status: AC
  Filled 2019-04-28: qty 2

## 2019-04-28 MED ORDER — ROSUVASTATIN CALCIUM 20 MG PO TABS: 40.0000 mg | ORAL_TABLET | Freq: Every day | ORAL | Status: DC

## 2019-04-28 MED ORDER — AMLODIPINE BESYLATE 2.5 MG PO TABS: 2.5000 mg | ORAL_TABLET | Freq: Every day | ORAL | Status: DC

## 2019-04-28 MED ORDER — ASPIRIN 81 MG PO CHEW: 81.0000 mg | CHEWABLE_TABLET | Freq: Every day | ORAL | Status: DC

## 2019-04-28 MED ORDER — ACETAMINOPHEN 325 MG PO TABS: 650.0000 mg | ORAL_TABLET | ORAL | Status: DC | PRN

## 2019-04-28 MED ORDER — SODIUM CHLORIDE 0.9 % WEIGHT BASED INFUSION: 1.0000 mL/kg/h | INTRAVENOUS | Status: DC

## 2019-04-28 MED ORDER — ONDANSETRON HCL 4 MG/2ML IJ SOLN: 4.0000 mg | Freq: Four times a day (QID) | INTRAMUSCULAR | Status: DC | PRN

## 2019-04-28 MED ORDER — ROSUVASTATIN CALCIUM 40 MG PO TABS: 40.0000 mg | ORAL_TABLET | Freq: Every day | ORAL | 3 refills | Status: DC

## 2019-04-28 MED ORDER — VERAPAMIL HCL 2.5 MG/ML IV SOLN
INTRAVENOUS | Status: AC
  Filled 2019-04-28: qty 2

## 2019-04-28 MED ORDER — SODIUM CHLORIDE 0.9% FLUSH: 3.0000 mL | INTRAVENOUS | Status: DC | PRN

## 2019-04-28 MED ORDER — MIDAZOLAM HCL 2 MG/2ML IJ SOLN
INTRAMUSCULAR | Status: DC | PRN
  Administered 2019-04-28: 2 mg via INTRAVENOUS
  Administered 2019-04-28: 1 mg via INTRAVENOUS

## 2019-04-28 MED ORDER — SODIUM CHLORIDE 0.9 % IV SOLN: INTRAVENOUS | Status: DC

## 2019-04-28 MED ORDER — METOPROLOL TARTRATE 25 MG PO TABS: 25.0000 mg | ORAL_TABLET | Freq: Two times a day (BID) | ORAL | Status: DC

## 2019-04-28 MED ORDER — HEPARIN (PORCINE) IN NACL 1000-0.9 UT/500ML-% IV SOLN
INTRAVENOUS | Status: AC
  Filled 2019-04-28: qty 1000

## 2019-04-28 MED ORDER — LABETALOL HCL 5 MG/ML IV SOLN: 10.0000 mg | INTRAVENOUS | Status: DC | PRN

## 2019-04-28 MED ORDER — FENTANYL CITRATE (PF) 100 MCG/2ML IJ SOLN
INTRAMUSCULAR | Status: AC
  Filled 2019-04-28: qty 2

## 2019-04-28 MED ORDER — SODIUM CHLORIDE 0.9 % WEIGHT BASED INFUSION
3.0000 mL/kg/h | INTRAVENOUS | Status: DC
  Administered 2019-04-28: 3 mL/kg/h via INTRAVENOUS

## 2019-04-28 MED ORDER — IOHEXOL 350 MG/ML SOLN
INTRAVENOUS | Status: DC | PRN
  Administered 2019-04-28: 08:00:00 70 mL via INTRACARDIAC

## 2019-04-28 MED ORDER — HEPARIN SODIUM (PORCINE) 1000 UNIT/ML IJ SOLN
INTRAMUSCULAR | Status: DC | PRN
  Administered 2019-04-28: 3700 [IU] via INTRAVENOUS

## 2019-04-28 MED ORDER — VERAPAMIL HCL 2.5 MG/ML IV SOLN
INTRAVENOUS | Status: DC | PRN
  Administered 2019-04-28: 08:00:00 10 mL via INTRA_ARTERIAL

## 2019-04-28 MED ORDER — METOPROLOL TARTRATE 25 MG PO TABS: 25.0000 mg | ORAL_TABLET | Freq: Two times a day (BID) | ORAL | 3 refills | Status: DC

## 2019-04-28 MED ORDER — ASPIRIN 81 MG PO CHEW: 81.0000 mg | CHEWABLE_TABLET | ORAL | Status: DC

## 2019-04-28 MED ORDER — LIDOCAINE HCL (PF) 1 % IJ SOLN
INTRAMUSCULAR | Status: DC | PRN
  Administered 2019-04-28: 2 mL

## 2019-04-28 MED ORDER — HYDRALAZINE HCL 20 MG/ML IJ SOLN: 10.0000 mg | INTRAMUSCULAR | Status: DC | PRN

## 2019-04-28 MED ORDER — AMLODIPINE BESYLATE 5 MG PO TABS: 2.5000 mg | ORAL_TABLET | Freq: Every day | ORAL | 3 refills | Status: DC

## 2019-04-28 MED ORDER — LIDOCAINE HCL (PF) 1 % IJ SOLN
INTRAMUSCULAR | Status: AC
  Filled 2019-04-28: qty 30

## 2019-04-28 SURGICAL SUPPLY — 11 items
CATH INFINITI 5FR ANG PIGTAIL (CATHETERS) ×1 IMPLANT
CATH OPTITORQUE TIG 4.0 5F (CATHETERS) ×1 IMPLANT
DEVICE RAD COMP TR BAND LRG (VASCULAR PRODUCTS) ×1 IMPLANT
GLIDESHEATH SLEND SS 6F .021 (SHEATH) ×1 IMPLANT
GUIDEWIRE INQWIRE 1.5J.035X260 (WIRE) IMPLANT
INQWIRE 1.5J .035X260CM (WIRE) ×2
KIT HEART LEFT (KITS) ×2 IMPLANT
PACK CARDIAC CATHETERIZATION (CUSTOM PROCEDURE TRAY) ×2 IMPLANT
SHEATH PROBE COVER 6X72 (BAG) ×1 IMPLANT
TRANSDUCER W/STOPCOCK (MISCELLANEOUS) ×2 IMPLANT
TUBING CIL FLEX 10 FLL-RA (TUBING) ×2 IMPLANT

## 2019-04-28 NOTE — Interval H&P Note (Signed)
Cath Lab Visit (complete for each Cath Lab visit)  Clinical Evaluation Leading to the Procedure:   ACS: No.  Non-ACS:    Anginal Classification: CCS III  Anti-ischemic medical therapy: Maximal Therapy (2 or more classes of medications)  Non-Invasive Test Results: No non-invasive testing performed  Prior CABG: No previous CABG      History and Physical Interval Note:  04/28/2019 7:30 AM  Stacy Fernandez  has presented today for surgery, with the diagnosis of chest pain.  The various methods of treatment have been discussed with the patient and family. After consideration of risks, benefits and other options for treatment, the patient has consented to  Procedure(s): LEFT HEART CATH AND CORONARY ANGIOGRAPHY (N/A) as a surgical intervention.  The patient's history has been reviewed, patient examined, no change in status, stable for surgery.  I have reviewed the patient's chart and labs.  Questions were answered to the patient's satisfaction.     Shelva Majestic

## 2019-04-28 NOTE — Discharge Instructions (Signed)
Moderate Conscious Sedation, Adult, Care After These instructions provide you with information about caring for yourself after your procedure. Your health care provider may also give you more specific instructions. Your treatment has been planned according to current medical practices, but problems sometimes occur. Call your health care provider if you have any problems or questions after your procedure. What can I expect after the procedure? After your procedure, it is common:  To feel sleepy for several hours.  To feel clumsy and have poor balance for several hours.  To have poor judgment for several hours.  To vomit if you eat too soon. Follow these instructions at home: For at least 24 hours after the procedure:   Do not: ? Participate in activities where you could fall or become injured. ? Drive. ? Use heavy machinery. ? Drink alcohol. ? Take sleeping pills or medicines that cause drowsiness. ? Make important decisions or sign legal documents. ? Take care of children on your own.  Rest. Eating and drinking  Follow the diet recommended by your health care provider.  If you vomit: ? Drink water, juice, or soup when you can drink without vomiting. ? Make sure you have little or no nausea before eating solid foods. General instructions  Have a responsible adult stay with you until you are awake and alert.  Take over-the-counter and prescription medicines only as told by your health care provider.  If you smoke, do not smoke without supervision.  Keep all follow-up visits as told by your health care provider. This is important. Contact a health care provider if:  You keep feeling nauseous or you keep vomiting.  You feel light-headed.  You develop a rash.  You have a fever. Get help right away if:  You have trouble breathing. This information is not intended to replace advice given to you by your health care provider. Make sure you discuss any questions you  have with your health care provider. Document Released: 04/26/2013 Document Revised: 06/18/2017 Document Reviewed: 10/26/2015 Elsevier Patient Education  2020 [place] right arm elevated above level of  Heart x24 hours  Radial Site Care  This sheet gives you information about how to care for yourself after your procedure. Your health care provider may also give you more specific instructions. If you have problems or questions, contact your health care provider. What can I expect after the procedure? After the procedure, it is common to have:  Bruising and tenderness at the catheter insertion area. Follow these instructions at home: Medicines  Take over-the-counter and prescription medicines only as told by your health care provider. Insertion site care  Follow instructions from your health care provider about how to take care of your insertion site. Make sure you: ? Wash your hands with soap and water before you change your bandage (dressing). If soap and water are not available, use hand sanitizer. ? Change your dressing as told by your health care provider. ? Leave stitches (sutures), skin glue, or adhesive strips in place. These skin closures may need to stay in place for 2 weeks or longer. If adhesive strip edges start to loosen and curl up, you may trim the loose edges. Do not remove adhesive strips completely unless your health care provider tells you to do that.  Check your insertion site every day for signs of infection. Check for: ? Redness, swelling, or pain. ? Fluid or blood. ? Pus or a bad smell. ? Warmth.  Do not take baths,  swim, or use a hot tub until your health care provider approves.  You may shower 24-48 hours after the procedure, or as directed by your health care provider. ? Remove the dressing and gently wash the site with plain soap and water. ? Pat the area dry with a clean towel. ? Do not rub the site. That could cause bleeding.  Do not apply  powder or lotion to the site. Activity   For 24 hours after the procedure, or as directed by your health care provider: ? Do not flex or bend the affected arm. ? Do not push or pull heavy objects with the affected arm. ? Do not drive yourself home from the hospital or clinic. You may drive 24 hours after the procedure unless your health care provider tells you not to. ? Do not operate machinery or power tools.  Do not lift anything that is heavier than 10 lb (4.5 kg), or the limit that you are told, until your health care provider says that it is safe.  Ask your health care provider when it is okay to: ? Return to work or school. ? Resume usual physical activities or sports. ? Resume sexual activity. General instructions  If the catheter site starts to bleed, raise your arm and put firm pressure on the site. If the bleeding does not stop, get help right away. This is a medical emergency.  If you went home on the same day as your procedure, a responsible adult should be with you for the first 24 hours after you arrive home.  Keep all follow-up visits as told by your health care provider. This is important. Contact a health care provider if:  You have a fever.  You have redness, swelling, or yellow drainage around your insertion site. Get help right away if:  You have unusual pain at the radial site.  The catheter insertion area swells very fast.  The insertion area is bleeding, and the bleeding does not stop when you hold steady pressure on the area.  Your arm or hand becomes pale, cool, tingly, or numb. These symptoms may represent a serious problem that is an emergency. Do not wait to see if the symptoms will go away. Get medical help right away. Call your local emergency services (911 in the [place] Do not drive yourself to the hospital. Summary  After the procedure, it is common to have bruising and tenderness at the site.  Follow instructions from your health care  provider about how to take care of your radial site wound. Check the wound every day for signs of infection.  Do not lift anything that is heavier than 10 lb (4.5 kg), or the limit that you are told, until your health care provider says that it is safe. This information is not intended to replace advice given to you by your health care provider. Make sure you discuss any questions you have with your health care provider. Document Released: 08/08/2010 Document Revised: 08/11/2017 Document Reviewed: 08/11/2017 Elsevier Patient Education  2020 [employer].

## 2019-05-01 ENCOUNTER — Other Ambulatory Visit: Payer: Self-pay | Admitting: Oncology

## 2019-05-12 ENCOUNTER — Other Ambulatory Visit: Payer: Self-pay

## 2019-05-12 DIAGNOSIS — C50412 Malignant neoplasm of upper-outer quadrant of left female breast: Secondary | ICD-10-CM

## 2019-05-12 DIAGNOSIS — Z171 Estrogen receptor negative status [ER-]: Secondary | ICD-10-CM

## 2019-05-14 NOTE — Progress Notes (Signed)
[place]  [phone] [phone]     ID: Stacy Fernandez DOB: September 09, 1960  MR#: [id number]  TGP#:[id number]  Patient Care Team: Stacy Booze, NP as PCP - General (Family Medicine) Stacy Skates, MD as Consulting Physician (General Surgery) Stacy Fernandez, Stacy Dad, MD as Consulting Physician (Oncology) Stacy Craver, MD as Consulting Physician (Gastroenterology) Stacy Noel, MD as Consulting Physician (Pulmonary Disease) Stacy Buff, MD as Consulting Physician (Obstetrics and Gynecology) Stacy Fernandez., MD as Referring Physician (Sports Medicine) Deterding, Stacy Rinks, MD as Consulting Physician (Nephrology) Stacy Ducking, MD as Consulting Physician (Neurology) OTHER MD:  Stacy Limbo MD, Stacy Ade MD  CHIEF COMPLAINT: HER-2 positive, estrogen receptor negative breast cancer (s/p bilateral mastectomies)  CURRENT TREATMENT:  observation   INTERVAL HISTORY: Stacy Fernandez returns today for follow up of her estrogen receptor negative breast cancer.   Since her last visit, she was diagnosed with Covid-19 on 10/24/2018.  She was hospitalized for 5 days and recovered well. Most recent testing on 04/25/2019 was negative.  She also presented to the ED in 03/2019 following a fall, which caused a right proximal fibula fracture and possible right 4th or 5th rib fracture. She was followed by Stacy. Durward Fernandez in outpatient orthopedics.  On 04/28/2019, she underwent left heart catheterization under Stacy. Claiborne Fernandez. This showed: Single-vessel CAD with calcification of the proximal to mid LAD with 20% ostial diagonal stenosis with 30% stenosis of the LAD immediately after the diagonal vessel, 60% smooth mildly calcified stenosis of the LAD in a slight bend in the vessel proximal to the second diagonal takeoff with mild smooth 10% LAD stenosis in the mid LAD.  The LAD is a large caliber and wraps around the LV apex.  Stacy Stacy Fernandez recommended a trial of medical therapy  with amlodipine, metoprolol and rosuvastatin. He also recommended smoking cessation  REVIEW OF SYSTEMS:  Stacy Fernandez is still recovering from her coronavirus experience.  Her hair is stiff and thinned out.  She is having heart problems.  She tends to have low blood pressure and is very difficult for her to take the cardiac medications.  She lost all her taste and smell and that is beginning to come back.  Initially she lost 15 to 20 pounds.  She was severely nauseated and severely short of breath and required 24/7 oxygen for several weeks.  It is wonderful that she was able to get through all that with Plaquenil and azathioprine which of course do not work but that is what they were giving people at the time.  Currently she is working from 11 PM to 7 AM 5 days a week and she barely can get that done.  She feels very tired and stressed.  A detailed review of systems was otherwise noncontributory   BREAST CANCER HISTORY: From the original intake note:  "Stacy Fernandez" noted some pain in the upper outer portion of her left breast, and some dimpling. She brought it to her primary physician's attention him a and on 02/21/2014 she underwent bilateral diagnostic mammography with tomography at [facility]. The breast composition was category B. In the left breast at the 3:00 position there was an irregular mass associated with skin retraction. Ultrasound performed on the same day confirmed a 1.1 cm irregular solid mass in the left breast at the 3:00 position. There were no abnormalities by sonography in the left axilla or in the superior portion of the breast, which is for the patient experiences some tenderness.  Biopsy of the left breast  area in question 02/22/2014 showed ([id number]) and invasive ductal carcinoma, grade 3, estrogen and progesterone receptor negative, with an MIB-1 of 83%, and HER-2 amplified, the signals ratio being 2.07, and a copy number per cell 4.75.  On 02/28/2014 the patient underwent bilateral breast  MRI, which showed in the posterior third of the left breast at the 3:00 position an irregular spiculated mass measuring 1.8 cm. The left breast was unremarkable and there were no abnormal appearing lymph nodes.  The patient's subsequent history is as detailed below   PAST MEDICAL HISTORY: Past Medical History:  Diagnosis Date  . Anxiety   . 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 Chemotherapy-induced peripheral neuropathy (HCC)    FEET  . Chronic migraine    caused by brain aneurysm--  BOTOX TX'S  by Stacy Fernandez  . CKD (chronic kidney disease), stage III   . COPD with emphysema [facility]) pulmologist--  Stacy Stacy Fernandez   advanded emphysema per CT   . History of chemotherapy    left breast ca-- 05-09-2015   to 04-30-2015  . History of gastroesophageal reflux (GERD)    no problems since Nissen fundoplication  . Hypothyroidism   . Rectovaginal fistula   . Skin changes related to chemotherapy    ARMS  . Supraclinoid carotid artery aneurysm, small neurologist-  Stacy Stacy Fernandez/  Stacy Fernandez   left internal - 2 mm;  aneurysm  vs.  infundibulum--  residual chronic migraine  . Wears dentures    upper  . Wears glasses     PAST SURGICAL HISTORY: Past Surgical History:  Procedure Laterality Date  . APPENDECTOMY  1992  . BREAST RECONSTRUCTION WITH PLACEMENT OF TISSUE EXPANDER AND FLEX HD (ACELLULAR HYDRATED DERMIS) Bilateral 09/25/2014   Procedure: PLACEMENT OF BILATERAL TISSUE EXPANDER AND  ACELLULAR DERMIS FOR BREAST RECONSTRUCTION ;  Surgeon: Stacy Limbo, MD;  Location: [place];  Service: Plastics;  Laterality: Bilateral;  . CHOLECYSTECTOMY  1999  . ESOPHAGOGASTRODUODENOSCOPY (EGD) WITH PROPOFOL  01/18/2013  . LAPAROSCOPIC NISSEN FUNDOPLICATION  [phone]  . LEFT HEART CATH AND  CORONARY ANGIOGRAPHY N/A 04/28/2019   Procedure: LEFT HEART CATH AND CORONARY ANGIOGRAPHY;  Surgeon: Troy Sine, MD;  Location: [place] CV LAB;  Service: Cardiovascular;  Laterality: N/A;  . LEFT HEART CATHETERIZATION WITH CORONARY ANGIOGRAM N/A 12/26/2012   Procedure: LEFT HEART CATHETERIZATION WITH CORONARY ANGIOGRAM;  Surgeon: Lorretta Harp, MD;  Location: [facility] CATH LAB;  Service: Cardiovascular;  Laterality: N/A;   Nonobstructive CAD/  40-50% ostial D1/  normal LVF, ef 60%  . LIPOSUCTION WITH LIPOFILLING Bilateral 12/18/2014   Procedure: LIPOFILLING TO BILATERAL CHEST;  Surgeon: Stacy Limbo, MD;  Location: [place];  Service: Plastics;  Laterality: Bilateral;  . PORT-A-CATH REMOVAL Right 06/28/2015   Procedure: REMOVAL PORT-A-CATH;  Surgeon: Leighton Ruff, MD;  Location: [facility];  Service: General;  Laterality: Right;  . PORTACATH PLACEMENT N/A 04/02/2014   Procedure: INSERTION PORT-A-CATH;  Surgeon: Stacy Skates, MD;  Location: [place];  Service: General;  Laterality: N/A;  . REMOVAL OF BILATERAL TISSUE EXPANDERS WITH PLACEMENT OF BILATERAL BREAST IMPLANTS Bilateral 12/18/2014   Procedure: REMOVAL OF BILATERAL TISSUE EXPANDERS,PLACEMENT SILICONE IMPLANTS ;  Surgeon: Stacy Limbo, MD;  Location: [place];  Service:  Plastics;  Laterality: Bilateral;  . SIMPLE MASTECTOMY WITH AXILLARY SENTINEL NODE BIOPSY Bilateral 04/02/2014   Procedure: LEFT TOTAL MASTECTOMY WITH LEFT AXILLARY SENTINEL NODE BIOPSY, RIGHT PROPHYLACTIC MASTETCTOMY;  Surgeon: Stacy Skates, MD;  Location: [place];  Service: General;  Laterality: Bilateral;  . TOTAL ABDOMINAL HYSTERECTOMY W/ BILATERAL SALPINGOOPHORECTOMY  1996  . TRANSTHORACIC ECHOCARDIOGRAM  02-04-2015   grade 1 diastolic dysfunction, ef 34-19%/  trivial TR  . VESICO-VAGINAL FISTULA REPAIR N/A 07/17/2015   Procedure: REPAIR OF RECTOVAGINAL FISTULA;  Surgeon: Stacy Buff, MD;  Location: [facility];   Service: Gynecology;  Laterality: N/A;  . VIDEO BRONCHOSCOPY Bilateral 01/16/2013   Procedure: VIDEO BRONCHOSCOPY WITHOUT FLUORO;  Surgeon: Stacy Noel, MD;  Location: [facility];  Service: Cardiopulmonary;  Laterality: Bilateral;    FAMILY HISTORY Family History  Problem Relation Age of Onset  . Heart failure Mother   . Colon cancer Father 68       stomach cancer also in 21s  . Throat cancer Brother 77       smoker  . Heart attack Maternal Grandmother   . Colon cancer Paternal Grandmother 65  . Cancer Paternal Grandfather        kidney and bladder  . Ovarian cancer Sister 62       ovarian cancer at 17, colorectal cancer at 41  . Throat cancer Brother 37       throat cancer, smoker  . Breast cancer Paternal Aunt 58  . Ovarian cancer Other 32       niece with ovarian cancer   The patient's father died at the age of 53 from metastatic stomach cancer the patient's father's mother died from colon cancer at the age of 34. The patient's mother died at the age of 79. The patient had 2 brothers and 2 sisters. One brother died at the age of 2 from throat cancer metastatic to the lung. He was a smoker. A second brother Was diagnosed at age 64 with throat cancer metastatic to lung. He has been given 90 days to live" is not looking good". One sister died from colon cancer metastatic to bone. One sister died of a drug overdose. There is no history of breast or bearing cancer in the family to the patient's knowledge.   GYNECOLOGIC HISTORY:  No LMP recorded. Patient has had a hysterectomy. Menarche age 69, first live birth age 3, the patient is [place] P4. She underwent hysterectomy with bilateral salpingo-oophorectomy approximately 1990. She took hormone replacement for approximately 2 years.   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  She can work 6 or even 7 days a week 8  hours a day, or 5 days a week 12 hours a day.  Currently she is doing the latter..  Her (second) husband, [name], is retired. The patient has 2 sons and 2 daughters from an earlier marriage, all living nearby; and  she has 30 stepchildren through her current husband. Altogether they have 19 grandchildren. She attends a local [employer]: Not in place   HEALTH MAINTENANCE: Social History   Tobacco Use  . Smoking status: Current Every Day Smoker    Packs/day: 0.00    Years: 37.00    Pack years: 0.00    Types: Cigarettes, E-cigarettes    Last attempt to quit: 03/29/2014    Years since quitting: 5.1  .  Smokeless tobacco: Never Used  . Tobacco comment: 5-7 cigs daily 06/07/17  Substance Use Topics  . Alcohol use: No  . Drug use: No     Colonoscopy: August 2015  PAP: May 2013  Bone density:  Lipid panel:  Allergies  Allergen Reactions  . Prednisone Shortness Of Breath and Swelling    CAN TOLERATE IF GIVEN BENADRYL PRIOR     Current Outpatient Medications  Medication Sig Dispense Refill  . acetaminophen (TYLENOL) 325 MG tablet Take 2 tablets (650 mg total) by mouth every 6 (six) hours as needed for mild pain or headache (fever >/= 101).    Marland Kitchen ALPRAZolam (XANAX) 0.25 MG tablet Take 0.125 mg by mouth daily.    Marland Kitchen amLODipine (NORVASC) 5 MG tablet Take 0.5 tablets (2.5 mg total) by mouth daily. 45 tablet 3  . aspirin EC 81 MG tablet Take 1 tablet (81 mg total) by mouth daily. (Patient taking differently: Take 81 mg by mouth daily. Chewable) 90 tablet 3  . Diclofenac Potassium (CAMBIA) 50 MG PACK Take 50 mg by mouth as needed (Migraine).    Marland Kitchen diclofenac sodium (VOLTAREN) 1 % GEL Apply 2 g topically as needed (Knee pain).    Marland Kitchen HYDROcodone-acetaminophen (NORCO/VICODIN) 5-325 MG tablet Take 1 tablet by mouth every 6 (six) hours as needed for moderate pain. (Patient taking differently: Take 1 tablet by mouth daily. ) 30 tablet 0  . levothyroxine (SYNTHROID, LEVOTHROID)  125 MCG tablet Take 125 mcg by mouth daily before breakfast.    . metoprolol tartrate (LOPRESSOR) 25 MG tablet Take 1 tablet (25 mg total) by mouth 2 (two) times daily. 90 tablet 3  . montelukast (SINGULAIR) 10 MG tablet Take 10 mg by mouth at bedtime.     Marland Kitchen PROAIR HFA 108 (90 BASE) MCG/ACT inhaler Inhale 2 puffs into the lungs every 4 (four) hours as needed for wheezing or shortness of breath. Reported on 07/08/2015    . rosuvastatin (CRESTOR) 40 MG tablet Take 1 tablet (40 mg total) by mouth daily. 90 tablet 3  . umeclidinium-vilanterol (ANORO ELLIPTA) 62.5-25 MCG/INH AEPB Inhale 1 puff into the lungs daily. 1 each 11  . vitamin C (VITAMIN C) 500 MG tablet Take 1 tablet (500 mg total) by mouth daily.    Marland Kitchen zinc sulfate 220 (50 Zn) MG capsule Take 1 capsule (220 mg total) by mouth daily.     No current facility-administered medications for this visit.     OBJECTIVE: Middle-aged white woman who appears stated age  20:   05/15/19 1559  BP: 97/73  Pulse: 75  Resp: 18  Temp: 98.2 F (36.8 C)  SpO2: 99%     Body mass index is 26.7 kg/m.    ECOG FS:1 - Symptomatic but completely ambulatory   Sclerae unicteric, EOMs intact Wearing a mask No cervical or supraclavicular adenopathy Lungs no rales or rhonchi Heart regular rate and rhythm Abd soft, nontender, positive bowel sounds MSK no focal spinal tenderness, no upper extremity lymphedema Neuro: nonfocal, well oriented, appropriate affect Breasts: Status post bilateral mastectomies with bilateral implant reconstruction.  No evidence of local recurrence.  Both axillae are benign.   LAB RESULTS:  CMP     Component Value Date/Time   NA 142 05/15/2019 1527   NA 141 04/25/2019 0910   NA 142 04/29/2017 1438   K 4.2 05/15/2019 1527   K 3.9 04/29/2017 1438   CL 108 05/15/2019 1527   CO2 23 05/15/2019 1527   CO2 23 04/29/2017 1438  GLUCOSE 91 05/15/2019 1527   GLUCOSE 75 04/29/2017 1438   BUN 12 05/15/2019 1527   BUN 10  04/25/2019 0910   BUN 20.3 04/29/2017 1438   CREATININE 0.82 05/15/2019 1527   CREATININE 1.1 04/29/2017 1438   CALCIUM 9.4 05/15/2019 1527   CALCIUM 9.3 04/29/2017 1438   PROT 7.3 05/15/2019 1527   PROT 7.0 03/24/2019 0842   PROT 6.9 04/29/2017 1438   ALBUMIN 4.1 05/15/2019 1527   ALBUMIN 4.6 03/24/2019 0842   ALBUMIN 4.1 04/29/2017 1438   AST 16 05/15/2019 1527   AST 16 04/29/2017 1438   ALT 16 05/15/2019 1527   ALT 10 04/29/2017 1438   ALKPHOS 139 (H) 05/15/2019 1527   ALKPHOS 64 04/29/2017 1438   BILITOT 0.4 05/15/2019 1527   BILITOT 0.34 04/29/2017 1438   GFRNONAA >60 05/15/2019 1527   GFRAA >60 05/15/2019 1527    I No results found for: SPEP  Lab Results  Component Value Date   WBC 4.9 05/15/2019   NEUTROABS 3.0 05/15/2019   HGB 12.9 05/15/2019   HCT 37.7 05/15/2019   MCV 92.4 05/15/2019   PLT 241 05/15/2019      Chemistry      Component Value Date/Time   NA 142 05/15/2019 1527   NA 141 04/25/2019 0910   NA 142 04/29/2017 1438   K 4.2 05/15/2019 1527   K 3.9 04/29/2017 1438   CL 108 05/15/2019 1527   CO2 23 05/15/2019 1527   CO2 23 04/29/2017 1438   BUN 12 05/15/2019 1527   BUN 10 04/25/2019 0910   BUN 20.3 04/29/2017 1438   CREATININE 0.82 05/15/2019 1527   CREATININE 1.1 04/29/2017 1438      Component Value Date/Time   CALCIUM 9.4 05/15/2019 1527   CALCIUM 9.3 04/29/2017 1438   ALKPHOS 139 (H) 05/15/2019 1527   ALKPHOS 64 04/29/2017 1438   AST 16 05/15/2019 1527   AST 16 04/29/2017 1438   ALT 16 05/15/2019 1527   ALT 10 04/29/2017 1438   BILITOT 0.4 05/15/2019 1527   BILITOT 0.34 04/29/2017 1438       No results found for: LABCA2  No components found for: LABCA125  No results for input(s): INR in the last 168 hours.  Urinalysis    Component Value Date/Time   COLORURINE YELLOW 02/18/2015 0510   APPEARANCEUR CLOUDY (A) 02/18/2015 0510   LABSPEC 1.018 02/18/2015 0510   LABSPEC 1.010 07/10/2014 1004   PHURINE 5.0 02/18/2015 0510    GLUCOSEU NEGATIVE 02/18/2015 0510   GLUCOSEU Negative 07/10/2014 1004   HGBUR SMALL (A) 02/18/2015 0510   BILIRUBINUR NEGATIVE 02/18/2015 0510   BILIRUBINUR Negative 07/10/2014 1004   KETONESUR NEGATIVE 02/18/2015 0510   PROTEINUR NEGATIVE 02/18/2015 0510   UROBILINOGEN 0.2 02/18/2015 0510   UROBILINOGEN 0.2 07/10/2014 1004   NITRITE NEGATIVE 02/18/2015 0510   LEUKOCYTESUR MODERATE (A) 02/18/2015 0510   LEUKOCYTESUR Negative 07/10/2014 1004    STUDIES: Xr Tibia/fibula Right  Result Date: 04/20/2019 4 view xray reveals no obvious callus formation. Some medial compartment joint space narrowing.  Vas [place] Lower Extremity Venous (dvt)  Result Date: 04/20/2019  Lower Venous Study Indications: Pain right lower extremity, and Fall approximately 5 weeks ago.  Performing Technologist: Alvia Grove RVT  Examination Guidelines: A complete evaluation includes B-mode imaging, spectral Doppler, color Doppler, and power Doppler as needed of all accessible portions of each vessel. Bilateral testing is considered an integral part of a complete examination. Limited examinations for reoccurring indications may be performed  as noted.  +---------+---------------+---------+-----------+----------+--------------+ RIGHT    CompressibilityPhasicitySpontaneityPropertiesThrombus Aging +---------+---------------+---------+-----------+----------+--------------+ CFV      Full           Yes      Yes                                 +---------+---------------+---------+-----------+----------+--------------+ SFJ      Full           Yes      Yes                                 +---------+---------------+---------+-----------+----------+--------------+ FV Prox  Full           Yes      Yes                                 +---------+---------------+---------+-----------+----------+--------------+ FV Mid   Full           Yes      Yes                                  +---------+---------------+---------+-----------+----------+--------------+ FV DistalFull           Yes      Yes                                 +---------+---------------+---------+-----------+----------+--------------+ PFV      Full           Yes      Yes                                 +---------+---------------+---------+-----------+----------+--------------+ POP      Full           Yes      Yes                                 +---------+---------------+---------+-----------+----------+--------------+ PTV      Full           Yes      Yes                                 +---------+---------------+---------+-----------+----------+--------------+ PERO     Full           Yes      Yes                                 +---------+---------------+---------+-----------+----------+--------------+ Gastroc  Full           Yes      Yes                                 +---------+---------------+---------+-----------+----------+--------------+ GSV      Full           Yes      Yes                                 +---------+---------------+---------+-----------+----------+--------------+  SSV      Full           Yes      Yes                                 +---------+---------------+---------+-----------+----------+--------------+   +----+---------------+---------+-----------+----------+--------------+ LEFTCompressibilityPhasicitySpontaneityPropertiesThrombus Aging +----+---------------+---------+-----------+----------+--------------+ CFV Full           Yes      Yes                                 +----+---------------+---------+-----------+----------+--------------+    Findings reported to Stacy. Durward Fernandez at 12:40.  Summary: Right: There is no evidence of deep or superficial vein thrombosis in the lower extremity.  *See table(s) above for measurements and observations. Electronically signed by Ruta Hinds MD on 04/20/2019 at 3:58:20 PM.    Final      ASSESSMENT: 58 y.o. BRCA negative [place] woman status post left breast Upper outer quadrant biopsy 02/22/2014 for a clinical T2/T3 NX, stage II or III invasive ductal carcinoma, grade 3, estrogen and progesterone receptor negative, with an MIB-1 of 83%, and HER-2 amplified with a signals a ratio of 2.07and a copy number per cell of 4.75  (1) biopsy of an additional area of enhancement in the left breast 03/08/2014 showed ductal carcinoma in situ, estrogen and progesterone receptor negative.  (2) status post bilateral mastectomies with left sentinel lymph node sampling 04/02/2014, showing:  (a) on the right, benign breast tissue including a single negative lymph node  (b) On  the left, a  pT1c pN0, stage IA invasive ductal carcinoma, grade 3, with negative margins  (3) adjuvant chemotherapy started 05/08/2014, consisting of carboplatin and docetaxel given every 3 weeks x6, together with trastuzumab, with neulasta day 2   (a) docetaxel removed from final cycle 08/21/2014 because of persistent neuropathy symptoms  (4) trastuzumab continued to complete a year (last dose 04/30/2015)  (a) echo 02/04/15 showed a well-preserved ejection fraction  (b) echo 01/09/2019 showed an EF in the 60-65% range  (5) reconstruction with bilateral silicone implants 78/67/6720 ([place])    (a) Mentor Siltex High Profile 600 ml implants placed bilaterally. 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  This is very favorable.  Her coronavirus story is pretty horrendous and she is still in the recovery phase.  They are not giving her a lot of leeway at work and she is working third shift 5 days a week.  She is managing to do that.  Unfortunately she is starting to smoke again.  She has not found Wellbutrin or Chantix helpful.  She tried vaping before but of course that has its own set of problems.  At  this point though as far as breast cancer is concerned I am comfortable releasing her to her primary care physician.  All she will need in terms of breast cancer follow-up is a yearly physician chest wall exam and every 5 years or so MRI for her implants.  I will be glad to see her again at any point in the future if and when the need arises but as of now are making no further routine appointments for her here.   Baylyn Sickles, Stacy Dad, MD  05/15/19 4:24 PM Medical Oncology and Hematology [facility] [address] [place], [place] [address] Tel. [phone]    Fax. [phone]    I, Wilburn Mylar, am acting as scribe for Stacy. Virgie Fernandez. Geffrey Michaelsen.  I, Lurline Del MD, have reviewed the above documentation for accuracy and completeness, and I agree with the above.  ADDENDUM: I am copying Stacy Jacqulyn Liner note today re Ms Egger';s implants:  "Siltex is brand name for Mentor's texturing- yes it is a textured device. It is micro texturing (compared to allergan "Biocell" macrotexture that was removed from market). Current incidence ALCL with microtexture reported as 1:60000. It is still available on market.   Regardless of texture, no current recommendation to remove textured implants unless concerns or patient desire. (There has been  reported cases ALCL even after textured implant and capsule removed.)"

## 2019-05-15 ENCOUNTER — Encounter (INDEPENDENT_AMBULATORY_CARE_PROVIDER_SITE_OTHER): Payer: Self-pay

## 2019-05-15 ENCOUNTER — Inpatient Hospital Stay (HOSPITAL_BASED_OUTPATIENT_CLINIC_OR_DEPARTMENT_OTHER): Payer: BC Managed Care – PPO | Admitting: Oncology

## 2019-05-15 ENCOUNTER — Inpatient Hospital Stay: Payer: BC Managed Care – PPO | Attending: Oncology

## 2019-05-15 ENCOUNTER — Other Ambulatory Visit: Payer: Self-pay

## 2019-05-15 VITALS — BP 97/73 | HR 75 | Temp 98.2°F | Resp 18 | Ht 66.0 in | Wt 165.4 lb

## 2019-05-15 DIAGNOSIS — F17210 Nicotine dependence, cigarettes, uncomplicated: Secondary | ICD-10-CM | POA: Diagnosis not present

## 2019-05-15 DIAGNOSIS — Z171 Estrogen receptor negative status [ER-]: Secondary | ICD-10-CM

## 2019-05-15 DIAGNOSIS — N183 Chronic kidney disease, stage 3 unspecified: Secondary | ICD-10-CM | POA: Insufficient documentation

## 2019-05-15 DIAGNOSIS — C50412 Malignant neoplasm of upper-outer quadrant of left female breast: Secondary | ICD-10-CM

## 2019-05-15 DIAGNOSIS — I7090 Unspecified atherosclerosis: Secondary | ICD-10-CM

## 2019-05-15 DIAGNOSIS — Z720 Tobacco use: Secondary | ICD-10-CM

## 2019-05-15 DIAGNOSIS — Z853 Personal history of malignant neoplasm of breast: Secondary | ICD-10-CM | POA: Insufficient documentation

## 2019-05-15 LAB — CBC WITH DIFFERENTIAL (CANCER CENTER ONLY)
Abs Immature Granulocytes: 0.01 10*3/uL (ref 0.00–0.07)
Basophils Absolute: 0 10*3/uL (ref 0.0–0.1)
Basophils Relative: 1 %
Eosinophils Absolute: 0.1 10*3/uL (ref 0.0–0.5)
Eosinophils Relative: 1 %
HCT: 37.7 % (ref 36.0–46.0)
Hemoglobin: 12.9 g/dL (ref 12.0–15.0)
Immature Granulocytes: 0 %
Lymphocytes Relative: 30 %
Lymphs Abs: 1.5 10*3/uL (ref 0.7–4.0)
MCH: 31.6 pg (ref 26.0–34.0)
MCHC: 34.2 g/dL (ref 30.0–36.0)
MCV: 92.4 fL (ref 80.0–100.0)
Monocytes Absolute: 0.4 10*3/uL (ref 0.1–1.0)
Monocytes Relative: 7 %
Neutro Abs: 3 10*3/uL (ref 1.7–7.7)
Neutrophils Relative %: 61 %
Platelet Count: 241 10*3/uL (ref 150–400)
RBC: 4.08 MIL/uL (ref 3.87–5.11)
RDW: 12.5 % (ref 11.5–15.5)
WBC Count: 4.9 10*3/uL (ref 4.0–10.5)
nRBC: 0 % (ref 0.0–0.2)

## 2019-05-15 LAB — CMP (CANCER CENTER ONLY)
ALT: 16 U/L (ref 0–44)
AST: 16 U/L (ref 15–41)
Albumin: 4.1 g/dL (ref 3.5–5.0)
Alkaline Phosphatase: 139 U/L — ABNORMAL HIGH (ref 38–126)
Anion gap: 11 (ref 5–15)
BUN: 12 mg/dL (ref 6–20)
CO2: 23 mmol/L (ref 22–32)
Calcium: 9.4 mg/dL (ref 8.9–10.3)
Chloride: 108 mmol/L (ref 98–111)
Creatinine: 0.82 mg/dL (ref 0.44–1.00)
GFR, Est AFR Am: >60 mL/min (ref >60)
GFR, Estimated: >60 mL/min (ref >60)
Glucose, Bld: 91 mg/dL (ref 70–99)
Potassium: 4.2 mmol/L (ref 3.5–5.1)
Sodium: 142 mmol/L (ref 135–145)
Total Bilirubin: 0.4 mg/dL (ref 0.3–1.2)
Total Protein: 7.3 g/dL (ref 6.5–8.1)

## 2019-05-25 ENCOUNTER — Telehealth: Payer: Self-pay | Admitting: General Surgery

## 2019-05-25 NOTE — Telephone Encounter (Signed)
-----   Message from [name], MD sent at 05/24/2019  3:29 PM EST ----- Needs FU OV   RA ----- Message ----- From: [name], MD Sent: 05/15/2019   4:29 PM EST To: Mauricia Area, MD, [name], MD, #

## 2019-05-25 NOTE — Telephone Encounter (Signed)
LVMTCB x 1 for patient based on request from Dr. Elsworth Soho.  Patient last seen in 2018. She will need to be made aware of our new location when appointment has been set up.

## 2019-05-25 NOTE — Progress Notes (Signed)
LVMTCB X 1 for patient. 

## 2019-05-31 ENCOUNTER — Encounter: Payer: Self-pay | Admitting: Pulmonary Disease

## 2019-05-31 ENCOUNTER — Ambulatory Visit (INDEPENDENT_AMBULATORY_CARE_PROVIDER_SITE_OTHER): Payer: BC Managed Care – PPO | Admitting: Pulmonary Disease

## 2019-05-31 ENCOUNTER — Other Ambulatory Visit: Payer: Self-pay

## 2019-05-31 DIAGNOSIS — Z720 Tobacco use: Secondary | ICD-10-CM

## 2019-05-31 DIAGNOSIS — J432 Centrilobular emphysema: Secondary | ICD-10-CM

## 2019-05-31 MED ORDER — PREDNISONE 10 MG PO TABS: ORAL_TABLET | ORAL | 0 refills | Status: DC

## 2019-05-31 MED ORDER — ALBUTEROL SULFATE (2.5 MG/3ML) 0.083% IN NEBU: 2.5000 mg | INHALATION_SOLUTION | Freq: Four times a day (QID) | RESPIRATORY_TRACT | 30 refills | Status: DC | PRN

## 2019-05-31 NOTE — Patient Instructions (Signed)
You have to quit smoking! Okay to try e-cigarette  Trial of Trelegy instead of Anoro-call me for prescription if this works  Prednisone 10 mg tabs  Take 2 tabs daily with food x 5ds, then 1 tab daily with food x 5ds then STOP  Prescription for nebulizer with albuterol nebs every 6 hours as needed #30  PFTs in 3 months

## 2019-05-31 NOTE — Assessment & Plan Note (Signed)
Her persistent dyspnea may be related to long Covid versus worsening COPD due to smoking  Trial of Trelegy instead of Anoro-call me for prescription if this works  Prednisone 10 mg tabs  Take 2 tabs daily with food x 5ds, then 1 tab daily with food x 5ds then STOP  Prescription for nebulizer with albuterol nebs every 6 hours as needed #30  PFTs in 3 months

## 2019-05-31 NOTE — Progress Notes (Signed)
Subjective:    Patient ID: Stacy Fernandez, female    DOB: 09-20-1960, 58 y.o.   MRN: [id number]  HPI  58 yo heavy ex-smoker for FU of COPD Her sister was Stacy Fernandez She smoked more than 30-pack-years and quit in 03/2014. She underwent prolonged chemotherapy and hormonal therapy for breast cancer. She also developed pneumonia during this.  She has a history of Nissen's fundoplication in [id number]   Chief Complaint  Patient presents with  . Chronic obstructive pulmonary disease, unspecified COPD type    Breathing has not been the same since covid exposure in April (positive test)   She was diagnosed with Covid pneumonia in 10/2018.  Her husband Stacy Fernandez also had a prolonged illness.  She was hospitalized for 8 days and was discharged on oxygen which she required for 6 to 8 weeks.  She continues to have excessive fatigue and dyspnea. She was evaluated for CAD and underwent left heart cath which showed noncritical CAD.  Medical therapy was advised, but metoprolol caused hypotension and she actually had a fall and hurt her ribs and fractured fibula. She developed leg swelling and underwent venous duplex which was neg 04/2019 I reviewed cardiac CT 01/2019 which only shows changes of emphysema and no evidence of ILD  She was tested negative for Covid prior to cardiac cath. Unfortunately she has relapsed with her smoking she had been quit for about 3 years and now is back to smoking 6 to 8 cigarettes daily.  She has had significant stressors including deaths in the family   Significant tests/ events reviewed  CT chest in 2014 had suggested endobronchial nodule in the right mainstem, bronchoscopy was nondiagnostic, follow-up CT chest in 2016 showed resolution of these nodules, also PET scan in 06/2015 was negative  CPET 11/2012 showed mildly reduced functional status with O2 of 76% predicted.  Spirometry 11/2012 -moderate airway obstruction with FEV1 of 58% FVC of 71% and ratio 65, diffusion  was 38%   Spirometry 11/2014  ratio 65, FEV1 61%, FVC 75%  Past Medical History:  Diagnosis Date  . Anxiety   . 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 Chemotherapy-induced peripheral neuropathy (HCC)    FEET  . 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 CKD (chronic kidney disease), stage III   . COPD with emphysema [facility]) pulmologist--  dr Elsworth Soho   advanded emphysema per CT   . History of chemotherapy    left breast ca-- 05-09-2015   to 04-30-2015  . History of gastroesophageal reflux (GERD)    no problems since Nissen fundoplication  . Hypothyroidism   . Rectovaginal fistula   . Skin changes related to chemotherapy    ARMS  . Supraclinoid carotid artery aneurysm, small neurologist-  dr willis/  dr Krista Blue   left internal - 2 mm;  aneurysm  vs.  infundibulum--  residual chronic migraine  . Wears dentures    upper  . Wears glasses       Review of Systems neg for any significant sore throat, dysphagia, itching, sneezing, nasal congestion or excess/ purulent secretions, fever, chills, sweats, unintended wt loss, pleuritic or exertional cp, hempoptysis, orthopnea pnd or change in chronic leg swelling. Also denies  presyncope, palpitations, heartburn, abdominal pain, nausea, vomiting, diarrhea or change in bowel or urinary habits, dysuria,hematuria, rash, arthralgias, visual complaints, headache, numbness weakness or ataxia.     Objective:   Physical Exam   Gen. Pleasant, well-nourished, in no distress, normal affect ENT - no pallor,icterus, no post nasal drip Neck: No JVD, no thyromegaly, no carotid bruits Lungs: no use of accessory muscles, no dullness to percussion, decreased without rales or rhonchi  Cardiovascular: Rhythm regular, heart  sounds  normal, no murmurs or gallops, no peripheral edema Abdomen: soft and non-tender, no hepatosplenomegaly, BS normal. Musculoskeletal: No deformities, no cyanosis or clubbing Neuro:  alert, non focal        Assessment & Plan:

## 2019-05-31 NOTE — Assessment & Plan Note (Signed)
You have to quit smoking! Okay to try e-cigarette Wellbutrin has worked in the past and we can use this again in the future

## 2019-05-31 NOTE — Addendum Note (Signed)
Addended by: Nena Polio on: 05/31/2019 05:27 PM   Modules accepted: Orders

## 2019-06-01 ENCOUNTER — Telehealth: Payer: Self-pay | Admitting: Pulmonary Disease

## 2019-06-01 DIAGNOSIS — J441 Chronic obstructive pulmonary disease with (acute) exacerbation: Secondary | ICD-10-CM

## 2019-06-01 NOTE — Telephone Encounter (Signed)
Called and spoke with Patient.  Patient stated Stacy Fernandez had not received order for nebulizer machine.  Explained most neb machines come thru DME companies. DME order placed for nebulizer machine. Nothing further at this time.  LOV 05/31/19 with Dr. Elsworth Soho-    Instructions    Return in about 3 months (around 08/31/2019). You have to quit smoking! Okay to try e-cigarette  Trial of Trelegy instead of Anoro-call me for prescription if this works  Prednisone 10 mg tabs  Take 2 tabs daily with food x 5ds, then 1 tab daily with food x 5ds then STOP  Prescription for nebulizer with albuterol nebs every 6 hours as needed #30  PFTs in 3 months

## 2019-06-02 ENCOUNTER — Other Ambulatory Visit: Payer: Self-pay | Admitting: General Surgery

## 2019-06-02 DIAGNOSIS — J432 Centrilobular emphysema: Secondary | ICD-10-CM

## 2019-06-02 DIAGNOSIS — J441 Chronic obstructive pulmonary disease with (acute) exacerbation: Secondary | ICD-10-CM

## 2019-06-05 MED ORDER — TRELEGY ELLIPTA 200-62.5-25 MCG/INH IN AEPB: 1.0000 | INHALATION_SPRAY | Freq: Every day | RESPIRATORY_TRACT | 0 refills | Status: DC

## 2019-06-05 NOTE — Addendum Note (Signed)
Addended by: Nena Polio on: 06/05/2019 09:29 AM   Modules accepted: Orders

## 2019-06-07 DIAGNOSIS — J441 Chronic obstructive pulmonary disease with (acute) exacerbation: Secondary | ICD-10-CM | POA: Diagnosis not present

## 2019-06-07 DIAGNOSIS — R911 Solitary pulmonary nodule: Secondary | ICD-10-CM | POA: Diagnosis not present

## 2019-06-07 DIAGNOSIS — J449 Chronic obstructive pulmonary disease, unspecified: Secondary | ICD-10-CM | POA: Diagnosis not present

## 2019-06-08 ENCOUNTER — Telehealth: Payer: Self-pay | Admitting: Pulmonary Disease

## 2019-06-08 NOTE — Telephone Encounter (Signed)
I called [facility] to check on the status of nebulizer. The rep stated the nebulizer machine was out for delivery today and would arrive at the pt;s house by 9pm. I called pt to let her know and informed her husband. He understood and nothing further is needed.

## 2019-06-12 ENCOUNTER — Telehealth: Payer: Self-pay | Admitting: Pulmonary Disease

## 2019-06-12 MED ORDER — TRELEGY ELLIPTA 100-62.5-25 MCG/INH IN AEPB: 1.0000 | INHALATION_SPRAY | Freq: Every day | RESPIRATORY_TRACT | 6 refills | Status: DC

## 2019-06-12 NOTE — Telephone Encounter (Signed)
Spoke with the pt  She states trelegy seems to be working well for her  Rx was sent to her preferred pharm  Nothing further needed

## 2019-06-27 ENCOUNTER — Encounter: Payer: Self-pay | Admitting: Cardiovascular Disease

## 2019-06-27 ENCOUNTER — Ambulatory Visit (INDEPENDENT_AMBULATORY_CARE_PROVIDER_SITE_OTHER): Payer: BC Managed Care – PPO | Admitting: Cardiovascular Disease

## 2019-06-27 ENCOUNTER — Other Ambulatory Visit: Payer: Self-pay

## 2019-06-27 VITALS — BP 102/67 | HR 70 | Temp 97.2°F | Ht 66.5 in | Wt 167.2 lb

## 2019-06-27 DIAGNOSIS — R002 Palpitations: Secondary | ICD-10-CM

## 2019-06-27 DIAGNOSIS — R0789 Other chest pain: Secondary | ICD-10-CM

## 2019-06-27 DIAGNOSIS — Z20828 Contact with and (suspected) exposure to other viral communicable diseases: Secondary | ICD-10-CM

## 2019-06-27 DIAGNOSIS — E039 Hypothyroidism, unspecified: Secondary | ICD-10-CM

## 2019-06-27 DIAGNOSIS — E785 Hyperlipidemia, unspecified: Secondary | ICD-10-CM | POA: Diagnosis not present

## 2019-06-27 DIAGNOSIS — I2584 Coronary atherosclerosis due to calcified coronary lesion: Secondary | ICD-10-CM

## 2019-06-27 DIAGNOSIS — Z20822 Contact with and (suspected) exposure to covid-19: Secondary | ICD-10-CM

## 2019-06-27 DIAGNOSIS — Z720 Tobacco use: Secondary | ICD-10-CM

## 2019-06-27 DIAGNOSIS — I2510 Atherosclerotic heart disease of native coronary artery without angina pectoris: Secondary | ICD-10-CM | POA: Diagnosis not present

## 2019-06-27 DIAGNOSIS — Z853 Personal history of malignant neoplasm of breast: Secondary | ICD-10-CM

## 2019-06-27 MED ORDER — METOPROLOL TARTRATE 25 MG PO TABS: ORAL_TABLET | ORAL | 3 refills | Status: DC

## 2019-06-27 NOTE — Progress Notes (Signed)
Cardiology Office Note    Date:  06/28/2019   ID:  Stacy Fernandez, DOB Mar 13, 1961, MRN [id number]  PCP:  Stacy Booze, NP  Cardiologist:  Shelva Majestic, MD    History of Present Illness:  Stacy Fernandez is a 58 y.o. female  who presents to the office for 2 month cardiology evaluation in follow-up of her cardiac catheterization.  I  Initially saw Ms Stacy Fernandez in 2014 for evaluation of chest pain.  She has a history of tobacco use, migraine headaches as well as COPD/emphysema.  In October 2013 a nuclear perfusion study showed normal perfusion and function.  She also had a history of GERD with reflux and had undergone esophageal manometry.  She developed chest pain leading to a [facility] long hospital presentation.  She underwent diagnostic cardiac catheterization by Dr. Gwenlyn Fernandez on December 25, 2012 which showed 40% ostial first diagonal branch stenosis of the LAD; all other vessels were normal.  EF was 60%.She had continued to smoke cigarettes and I saw her in follow-up of her hospitalization in September 2014.  She has a history of a history of breast CA and is followed by Dr. Jana Fernandez.  Apparently earlier this year both she and her husband develop symptoms of COVID-19.  They believe that this may have been acquired in their church since 19 members tested positive and to subsequently died.  Her husband developed COVID initially and was hospitalized at [facility] for 3 weeks.  She was admitted to [place] home due to her COVID in early April and was hospitalized for 1 week.  The day of discharge is when the [facility] open for COVID patients.  Subsequent testing 2 weeks later still revealed her to be COVID positive and it was not until a May test that she tested COVID negative.  Subsequent antibody testing revealed that she had a strong positive antibody response.  Due to a 2 to 4-week period of increasing palpitations as well as some vague discomfort in her chest described as a tightness she was  referred for a telemedicine evaluation with me on January 05, 2019.  During that evaluation she states that her levothyroxine dose had been reduced from 175 down to 125 mcg.  I recommended that she wear an event monitor for 2 weeks.  I initiated therapy with metoprolol tartrate initially at 12.5 mg twice a day.  Since I did not know her blood pressure I did not initiate at a higher dose.  At the time she was drinking 1 to 2 cups of coffee per day and discontinuance of all caffeine was recommended.  In light of her previous catheterization in 2014 which showed mild nonobstructive disease with her longstanding tobacco history I recommended she undergo CT coronary angiogram to further evaluate her chest tightness and assess for progressive CAD.  I also scheduled her for 2D echo Doppler study and follow-up chest x-ray.  We discussed the importance of complete smoking cessation.   Her 2D echo Doppler study was done on January 09, 2019 which showed an EF of 60 to 65%.  He had normal diastolic parameters.  There was mild aortic sclerosis and mild thickening of mitral valve leaflets.  Both atrium were normal in size.  A 2-week monitor from June 29 through January 29, 2019 was essentially unremarkable.  She was predominantly in sinus rhythm July 6 at 4:47 AM with a rate 56 and a maximum heart rate on January 28, 2019 at 7:49 PM at 126 bpm  which was sinus tachycardia.  She underwent CT coronary angiography.  Calcium score was increased to 514 which was 99th percentile for age and sex matched control.  There is moderate calcified plaque in the proximal LAD with 50 to 69% stenosis as well as moderate calcified plaque in the proximal OM with 50 to 69% stenosis.  FFR was sent but results are still pending.  She has had issues with somewhat low blood pressure.  Laboratory from January 16, 2019 showed increased total cholesterol at 202, triglycerides 228, HDL 39, LDL 117.  Rosuvastatin 10 mg was initiated and she has been on therapy for  approximately 3 weeks and tolerating this well.    Since I last saw her, her FFR analysis was essentially negative with mean at 0.99, proximal LAD 0.98, mid LAD 0.84, however the distal LAD was 0.75.  The circumflex and RCA FFR's were all normal at 0.9 and above.  At that time, continued medical therapy was recommended.  When I last saw her on April 17, 2019 she was experiencing occasional palpitations and admitted to episodes of chest pressure.   Oftentimes her chest pressure may last 10 to 15 minutes and resolve spontaneously.  During that evaluation, in light of her increasing symptomatology I recommended definitive repeat cardiac catheterization.  Catheterization was done on April 28, 2019 which revealed predominant single-vessel CAD with calcification of the proximal to mid LAD with 20% ostial diagonal stenosis, 30% stenosis of the LAD immediately after the diagonal vessel, and 60% smooth mildly calcified stenosis of the mid LAD and a slight bend in the vessel proximal to the second diagonal takeoff with mild 10% LAD stenosis beyond.  The LAD was large caliber and wrapped around the apex.  Her circumflex and dominant RCA were normal.  Following her catheterization I initiated amlodipine 2.5 mg at bedtime since she did not tolerate isosorbide initiation secondary to precipitation of migraine headaches.  Also adjusted beta-blocker therapy with metoprolol tartrate 25 mg twice a day, and increase rosuvastatin to 40 mg daily in attempt to induce plaque regression.  I strongly advised smoking cessation.  Subsequent to her catheterization she has felt improved.  She still works third shift.  She reduced her tobacco but still smokes 2 to 3 cigarettes/day.  At times she still experiences some mild chest discomfort.  Typically she gets home from work at 7:30 AM, sleeps, and goes to work at [employer].  She presents for evaluation.   Past Medical History:  Diagnosis Date  . Anxiety   . 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 Chemotherapy-induced peripheral neuropathy (HCC)    FEET  . 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 CKD (chronic kidney disease), stage III   . COPD with emphysema [facility]) pulmologist--  dr Elsworth Soho   advanded emphysema per CT   . History of chemotherapy    left breast ca-- 05-09-2015   to 04-30-2015  . History of gastroesophageal reflux (GERD)    no problems since Nissen fundoplication  . Hypothyroidism   . Rectovaginal fistula   . Skin changes related to chemotherapy    ARMS  . Supraclinoid carotid artery aneurysm, small neurologist-  dr willis/  dr Krista Blue  left internal - 2 mm;  aneurysm  vs.  infundibulum--  residual chronic migraine  . Wears dentures    upper  . Wears glasses     Past Surgical History:  Procedure Laterality Date  . APPENDECTOMY  1992  . BREAST RECONSTRUCTION WITH PLACEMENT OF TISSUE EXPANDER AND FLEX HD (ACELLULAR HYDRATED DERMIS) Bilateral 09/25/2014   Procedure: PLACEMENT OF BILATERAL TISSUE EXPANDER AND  ACELLULAR DERMIS FOR BREAST RECONSTRUCTION ;  Surgeon: [name], MD;  Location: [place];  Service: Plastics;  Laterality: Bilateral;  . CHOLECYSTECTOMY  1999  . ESOPHAGOGASTRODUODENOSCOPY (EGD) WITH PROPOFOL  01/18/2013  . LAPAROSCOPIC NISSEN FUNDOPLICATION  [phone]  . LEFT HEART CATH AND CORONARY ANGIOGRAPHY N/A 04/28/2019   Procedure: LEFT HEART CATH AND CORONARY ANGIOGRAPHY;  Surgeon: Troy Sine, MD;  Location: [place] CV LAB;  Service: Cardiovascular;  Laterality: N/A;  . LEFT HEART CATHETERIZATION WITH CORONARY ANGIOGRAM N/A 12/26/2012   Procedure: LEFT HEART CATHETERIZATION WITH CORONARY ANGIOGRAM;  Surgeon: Lorretta Harp, MD;  Location: [facility] CATH LAB;  Service:  Cardiovascular;  Laterality: N/A;   Nonobstructive CAD/  40-50% ostial D1/  normal LVF, ef 60%  . LIPOSUCTION WITH LIPOFILLING Bilateral 12/18/2014   Procedure: LIPOFILLING TO BILATERAL CHEST;  Surgeon: [name], MD;  Location: [place];  Service: Plastics;  Laterality: Bilateral;  . PORT-A-CATH REMOVAL Right 06/28/2015   Procedure: REMOVAL PORT-A-CATH;  Surgeon: Leighton Ruff, MD;  Location: [facility];  Service: General;  Laterality: Right;  . PORTACATH PLACEMENT N/A 04/02/2014   Procedure: INSERTION PORT-A-CATH;  Surgeon: [name], MD;  Location: [place];  Service: General;  Laterality: N/A;  . REMOVAL OF BILATERAL TISSUE EXPANDERS WITH PLACEMENT OF BILATERAL BREAST IMPLANTS Bilateral 12/18/2014   Procedure: REMOVAL OF BILATERAL TISSUE EXPANDERS,PLACEMENT SILICONE IMPLANTS ;  Surgeon: [name], MD;  Location: [place];  Service: Plastics;  Laterality: Bilateral;  . SIMPLE MASTECTOMY WITH AXILLARY SENTINEL NODE BIOPSY Bilateral 04/02/2014   Procedure: LEFT TOTAL MASTECTOMY WITH LEFT AXILLARY SENTINEL NODE BIOPSY, RIGHT PROPHYLACTIC MASTETCTOMY;  Surgeon: [name], MD;  Location: [place];  Service: General;  Laterality: Bilateral;  . TOTAL ABDOMINAL HYSTERECTOMY W/ BILATERAL SALPINGOOPHORECTOMY  1996  . TRANSTHORACIC ECHOCARDIOGRAM  02-04-2015   grade 1 diastolic dysfunction, ef 98-33%/  trivial TR  . VESICO-VAGINAL FISTULA REPAIR N/A 07/17/2015   Procedure: REPAIR OF RECTOVAGINAL FISTULA;  Surgeon: [name], MD;  Location: [facility];  Service: Gynecology;  Laterality: N/A;  . VIDEO BRONCHOSCOPY Bilateral 01/16/2013   Procedure: VIDEO BRONCHOSCOPY WITHOUT FLUORO;  Surgeon: [name], MD;  Location: [facility]OS[facility];  Service: Cardiopulmonary;  Laterality: Bilateral;    Current Medications: Outpatient Medications Prior to Visit  Medication Sig Dispense Refill  . acetaminophen (TYLENOL) 325 MG tablet Take 2 tablets (650 mg  total) by mouth every 6 (six) hours as needed for mild pain or headache (fever >/= 101).    Marland Kitchen albuterol (PROVENTIL) (2.5 MG/3ML) 0.083% nebulizer solution Take 3 mLs (2.5 mg total) by nebulization every 6 (six) hours as needed for wheezing or shortness of breath. 75 mL 30  . ALPRAZolam (XANAX) 0.25 MG tablet Take 0.125 mg by mouth daily.    Marland Kitchen amLODipine (NORVASC) 5 MG tablet Take 0.5 tablets (2.5 mg total) by mouth daily. 45 tablet 3  . aspirin EC 81 MG tablet Take 1 tablet (81 mg total) by mouth daily. (Patient taking differently: Take 81 mg by mouth daily. Chewable) 90 tablet 3  . Diclofenac  Potassium,Migraine, 50 MG PACK Take 50 mg by mouth. As Needed for Migraine    . Fluticasone-Umeclidin-Vilant (TRELEGY ELLIPTA) 100-62.5-25 MCG/INH AEPB Inhale 1 puff into the lungs daily. 60 each 6  . HYDROcodone-acetaminophen (NORCO/VICODIN) 5-325 MG tablet Take 1 tablet by mouth every 6 (six) hours as needed for moderate pain. (Patient taking differently: Take 1 tablet by mouth daily. ) 30 tablet 0  . levothyroxine (SYNTHROID, LEVOTHROID) 125 MCG tablet Take 125 mcg by mouth daily before breakfast.    . montelukast (SINGULAIR) 10 MG tablet Take 10 mg by mouth at bedtime.     Marland Kitchen PROAIR HFA 108 (90 BASE) MCG/ACT inhaler Inhale 2 puffs into the lungs every 4 (four) hours as needed for wheezing or shortness of breath. Reported on 07/08/2015    . rosuvastatin (CRESTOR) 40 MG tablet Take 1 tablet (40 mg total) by mouth daily. 90 tablet 3  . vitamin C (VITAMIN C) 500 MG tablet Take 1 tablet (500 mg total) by mouth daily.    Marland Kitchen zinc sulfate 220 (50 Zn) MG capsule Take 1 capsule (220 mg total) by mouth daily.    . metoprolol tartrate (LOPRESSOR) 25 MG tablet Take 1 tablet (25 mg total) by mouth 2 (two) times daily. 90 tablet 3  . Diclofenac Potassium (CAMBIA) 50 MG PACK Take 50 mg by mouth as needed (Migraine).    Marland Kitchen diclofenac sodium (VOLTAREN) 1 % GEL Apply 2 g topically as needed (Knee pain).    .  Fluticasone-Umeclidin-Vilant (TRELEGY ELLIPTA) 200-62.5-25 MCG/INH AEPB Inhale 1 puff into the lungs daily. (Patient not taking: Reported on 06/27/2019) 1 each 0  . predniSONE (DELTASONE) 10 MG tablet Take 2 tablets x 5 days with food, then take 1 tablet daily x 5 days with food, then stop. 20 tablet 0  . umeclidinium-vilanterol (ANORO ELLIPTA) 62.5-25 MCG/INH AEPB Inhale 1 puff into the lungs daily. (Patient not taking: Reported on 05/31/2019) 1 each 11   No facility-administered medications prior to visit.      Allergies:   Prednisone   Social History   Socioeconomic History  . Marital status: Married    Spouse name: [name]   . Number of children: 4  . Years of education: GED  . Highest education level: Not on file  Occupational History    Employer: [employer]  Social Needs  . Financial resource strain: Not hard at all  . Food insecurity    Worry: Never true    Inability: Never true  . Transportation needs    Medical: No    Non-medical: No  Tobacco Use  . Smoking status: Current Every Day Smoker    Packs/day: 0.00    Years: 37.00    Pack years: 0.00    Types: Cigarettes, E-cigarettes    Last attempt to quit: 03/29/2014    Years since quitting: 5.2  . Smokeless tobacco: Never Used  . Tobacco comment: 5-7 cigs daily 06/07/17  Substance and Sexual Activity  . Alcohol use: No  . Drug use: No  . Sexual activity: Not Currently  Lifestyle  . Physical activity    Days per week: Patient refused    Minutes per session: Patient refused  . Stress: Patient refused  Relationships  . Social Herbalist on phone: Patient refused    Gets together: Patient refused    Attends religious service: Patient refused    Active member of club or organization: Patient refused    Attends meetings of clubs or organizations: Patient  refused    Relationship status: Patient refused  Other Topics Concern  . Not on file  Social History Narrative   Patient lives at home with her  husband [name]) Patient works full time.   Education- GED   Right handed.   Caffeine- one cup of coffee daily.     Family History:  The patient's family history includes Breast cancer (age of onset: 64) in her paternal aunt; Cancer in her paternal grandfather; Colon cancer (age of onset: 4) in her paternal grandmother; Colon cancer (age of onset: 16) in her father; Heart attack in her maternal grandmother; Heart failure in her mother; Ovarian cancer (age of onset: 59) in her sister; Ovarian cancer (age of onset: 19) in an other family member; Throat cancer (age of onset: 6) in her brother; Throat cancer (age of onset: 41) in her brother.   ROS General: Negative; No fevers, chills, or night sweats;  HEENT: Negative; No changes in vision or hearing, sinus congestion, difficulty swallowing Pulmonary: Negative; No cough, wheezing, shortness of breath, hemoptysis Cardiovascular: See HPI GI: Negative; No nausea, vomiting, diarrhea, or abdominal pain GU: Negative; No dysuria, hematuria, or difficulty voiding Musculoskeletal: Fall resulting in rib fracture and fracture fibula Hematologic/Oncology: History of breast CA Endocrine: Negative; no heat/cold intolerance; no diabetes Neuro: Negative; no changes in balance, headaches Skin: Negative; No rashes or skin lesions Psychiatric: Negative; No behavioral problems, depression Sleep: Negative; No snoring, daytime sleepiness, hypersomnolence, bruxism, restless legs, hypnogognic hallucinations, no cataplexy Other comprehensive 14 point system review is negative.   PHYSICAL EXAM:   VS:  BP 102/67   Pulse 70   Temp (!) 97.2 F (36.2 C)   Ht 5' 6.5" (1.689 m)   Wt 167 lb 3.2 oz (75.8 kg)   SpO2 99%   BMI 26.58 kg/m     Repeat blood pressure by me was 112/70 supine 104/70 standing  Wt Readings from Last 3 Encounters:  06/27/19 167 lb 3.2 oz (75.8 kg)  05/31/19 164 lb 9.6 oz (74.7 kg)  05/15/19 165 lb 6.4 oz (75 kg)    General: Alert,  oriented, no distress.  Skin: normal turgor, no rashes, warm and dry HEENT: Normocephalic, atraumatic. Pupils equal round and reactive to light; sclera anicteric; extraocular muscles intact;  Nose without nasal septal hypertrophy Mouth/Parynx benign; Mallinpatti scale 2 Neck: No JVD, no carotid bruits; normal carotid upstroke Lungs: clear to ausculatation and percussion; no wheezing or rales Chest wall: without tenderness to palpitation Heart: PMI not displaced, RRR, s1 s2 normal, 1/6 systolic murmur, no diastolic murmur, no rubs, gallops, thrills, or heaves Abdomen: soft, nontender; no hepatosplenomehaly, BS+; abdominal aorta nontender and not dilated by palpation. Back: no CVA tenderness Pulses 2+ right radial site stable Musculoskeletal: full range of motion, normal strength, no joint deformities Extremities: no clubbing cyanosis or edema, Homan's sign negative  Neurologic: grossly nonfocal; Cranial nerves grossly wnl Psychologic: Normal mood and affect   Studies/Labs Reviewed:   ECG (independently read by me): Normal sinus rhythm at 70 bpm.  No ectopy.  Normal intervals.  September 2020 ECG (independently read by me): Normal sinus rhythm at 75 beats per minute.  New mild RV conduction delay V1 V2 with T wave inversion and new T wave inversion in lead V3 and V4.  Normal intervals.  No ectopy  October 29, 2018 ECG (independently read by me): Normal sinus rhythm at 74 bpm.  No ectopy.  Normal intervals.  No ST segment changes.  Recent Labs: BMP Latest Ref Rng &  Units 05/15/2019 04/25/2019 03/24/2019  Glucose 70 - 99 mg/dL 91 90 90  BUN 6 - 20 mg/dL '12 10 15  ' Creatinine 0.44 - 1.00 mg/dL 0.82 0.80 0.81  BUN/Creat Ratio 9 - 23 - 13 19  Sodium 135 - 145 mmol/L 142 141 143  Potassium 3.5 - 5.1 mmol/L 4.2 4.5 4.7  Chloride 98 - 111 mmol/L 108 105 106  CO2 22 - 32 mmol/L '23 22 20  ' Calcium 8.9 - 10.3 mg/dL 9.4 10.0 9.6     Hepatic Function Latest Ref Rng & Units 05/15/2019 03/24/2019  01/16/2019  Total Protein 6.5 - 8.1 g/dL 7.3 7.0 6.7  Albumin 3.5 - 5.0 g/dL 4.1 4.6 4.4  AST 15 - 41 U/L '16 20 15  ' ALT 0 - 44 U/L '16 16 16  ' Alk Phosphatase 38 - 126 U/L 139(H) 126(H) 131(H)  Total Bilirubin 0.3 - 1.2 mg/dL 0.4 0.5 0.3    CBC Latest Ref Rng & Units 05/15/2019 04/25/2019 01/16/2019  WBC 4.0 - 10.5 K/uL 4.9 7.3 4.7  Hemoglobin 12.0 - 15.0 g/dL 12.9 12.2 12.7  Hematocrit 36.0 - 46.0 % 37.7 35.8 37.6  Platelets 150 - 400 K/uL 241 249 244   Lab Results  Component Value Date   MCV 92.4 05/15/2019   MCV 93 04/25/2019   MCV 92 01/16/2019   Lab Results  Component Value Date   TSH 4.610 (H) 01/16/2019   Lab Results  Component Value Date   HGBA1C 5.0 12/26/2012     BNP No results Fernandez for: BNP  ProBNP    Component Value Date/Time   PROBNP 326.1 (H) 12/26/2012 0553     Lipid Panel     Component Value Date/Time   CHOL 132 03/24/2019 0842   TRIG 228 (H) 03/24/2019 0842   HDL 40 03/24/2019 0842   CHOLHDL 3.3 03/24/2019 0842   CHOLHDL 4.4 12/26/2012 0340   VLDL 17 12/26/2012 0340   LDLCALC 55 03/24/2019 0842     RADIOLOGY: No results Fernandez.   Additional studies/ records that were reviewed today include:  ECHO 02/04/2015 Study Conclusions  - Left ventricle: The cavity size was normal. Systolic function was normal. The estimated ejection fraction was in the range of 50% to 55%. Wall motion was normal; there were no regional wall motion abnormalities. Doppler parameters are consistent with abnormal left ventricular relaxation (grade 1 diastolic dysfunction). There was no evidence of elevated ventricular filling pressure by Doppler parameters. - Aortic valve: Trileaflet; normal thickness leaflets. Transvalvular velocity was within the normal range. There was no stenosis. There was no regurgitation. - Aortic root: The aortic root was normal in size. - Mitral valve: Structurally normal valve. There was no regurgitation. - Left  atrium: The atrium was normal in size. - Right ventricle: The cavity size was normal. Wall thickness was normal. Systolic function was normal. - Right atrium: The atrium was normal in size. - Tricuspid valve: There was trivial regurgitation. - Pulmonary arteries: Systolic pressure was within the normal range. - Inferior vena cava: The vessel was normal in size. - Pericardium, extracardiac: There was no pericardial effusion.   ECHO IMPRESSIONS 01/09/2019  1. The left ventricle has normal systolic function with an ejection fraction of 60-65%. The cavity size was normal. Left ventricular diastolic parameters were normal.  2. The right ventricle has normal systolic function. The cavity was normal. There is no increase in right ventricular wall thickness.  3. Mild thickening of the mitral valve leaflet.  4. The aortic valve is  tricuspid. Mild thickening of the aortic valve. Mild calcification of the aortic valve.   Cardiac CT IMPRESSION:02/02/2019  1. Coronary calcium score of 514. This was 99th percentile for age and sex matched control.  2. Normal coronary origin with right dominance.  3. Moderate calcified plaque in the proximal LAD with 50-69%% stenosis. CAD RAD 3.  4. Moderate calcified plaque in the proximal OM with 50-69% stenosis. CAD RAD 3  5. FFR has been sent to evaluate LAD and OM.  6. Recommend Aggressive Risk Factor Modification.  CT FFR ANALYSIS FINDINGS: FFRct analysis was performed on the original cardiac CT angiogram dataset. Diagrammatic representation of the FFRct analysis is provided in a separate PDF document in PACS. This dictation was created using the PDF document and an interactive 3D model of the results. 3D model is not available in the EMR/PACS. Normal FFR range is >0.80.  1. Left Main: No significant stenosis.  FFR LM = 0.99  2. LAD: No significant stenosis. Proximal FFR = 0.98, Mid FFR = 0.84, Distal FFR = 0.75. 3. LCX: No significant  stenosis. Proximal FFR = 0.98, Mid FFR = 0.96, Distal FFR = 0.93. 4. RCA: No significant stenosis. Proximal FFR = 0.99, Mid FFR = 0.95, Distal FFR = 0.91. PDA not assessed.  IMPRESSION: 1. CT FFR analysis showed a borderline hemodynamically significant reduction in flow in the distal LAD. This represents either diffuse distal disease or distal tapering of the vessel.  2. Recommend medical management and aggressive risk factor Modification.   CARDIAC CATH: 04/28/2019  Prox LAD to Mid LAD lesion is 30% stenosed.  Mid LAD-1 lesion is 60% stenosed.  Mid LAD-2 lesion is 10% stenosed.  The left ventricular systolic function is normal.  LV end diastolic pressure is low.  1st Diag lesion is 20% stenosed.   Single-vessel CAD with calcification of the proximal to mid LAD with 20% ostial diagonal stenosis with 30% stenosis of the LAD immediately after the diagonal vessel, 60% smooth mildly calcified stenosis of the LAD in a slight bend in the vessel proximal to the second diagonal takeoff with mild smooth 10% LAD stenosis in the mid LAD.  The LAD is a large caliber and wraps around the LV apex.  Normal left circumflex and dominant RCA.  No LV function with EF estimate at least 55%.  LVEDP 9 mm.  RECOMMENDATION: Medical therapy trial.  The patient did not tolerate isosorbide initiation secondary to precipitation migraine headaches; will initiate amlodipine 2.5 mg at bedtime and adjust beta-blocker therapy with metoprolol tartrate to 25 mg twice a day.  Increase rosuvastatin to 40 mg daily in attempt to induce plaque regression.  Smoking cessation is imperative.      ASSESSMENT:    1. Covid-19 Virus Infection: April 2020   2. Coronary atherosclerosis due to calcified coronary lesion   3. Hyperlipidemia with target LDL less than 70   4. Chest tightness   5. Palpitations   6. Tobacco abuse   7. Hypothyroidism, unspecified type   8. History of breast cancer      PLAN:    1.  Chest pain/CAD: Coronary calcium score was elevated at 514 and with documentation of plaque and recurrent symptomatology despite the addition of beta-blocker and nitrates, definitive cardiac catheterization was recommended.  I reviewed the catheterization findings with her in detail.  She had a normal circumflex and RCA but had mild calcification of the LAD with her tightest stenosis being 60% in the mid vessel.  I had previously  titrated her metoprolol to 25 mg twice a day following her catheterization.  At times she still experiences some mild chest discomfort.  I have recommended she increase her evening dose of metoprolol to 37.5 mg which will be taken prior to going to work third shift.  And she will take 25 mg in the morning upon returning from work before going to bed.  She did not tolerate nitrate therapy secondary to headaches and her history of migraines.  She is tolerating low-dose amlodipine 2.5 mg.  Blood pressure remained stable but further titration amlodipine can be undertaken if necessary.  2.  History of COVID-19 requiring 1 week hospitalization: Her symptoms began on April 1.  She states her peak temperature was 104.  Her husband had developed COVID several days prior to her and he had been hospitalized for 13 days.  She received a 5-day course of hydroxychloroquine.  COVID test 2 weeks after discharge was still positive and ultimately in mid-May COVID test was negative.  She has been demonstrated to have a strong antibody response.  Repeat Covid testing prior to her catheterization was negative.  3.  Palpitations: She has been demonstrated to have sinus tachycardia with heart rate speeding up.  This has improved with titration of beta-blocker therapy.  As noted above we will further titrate metoprolol to 37.5 mg in the evening prior to going to the work and 25 mg in the morning.  Resting pulse today is stable at 70.  4.  Hyperlipidemia: Laboratory revealed total cholesterol 202,  triglycerides 228 VLDL 46 HDL 39 LDL 117.  Her rosuvastatin dose has been titrated following her catheterization up to 40 mg daily.  Target LDL less than 70 and preferably 50s or below.  We will recheck lipids and chemistry profile following several months of increased regimen.  5.  Hypothyroidism: TSH 4.61 on reduced dose of levothyroxine 125 mcg.    6.  Tobacco history: I again discussed the absolute importance of complete smoking cessation.  She has reduced smoking to 2 to 3 cigarettes/day.  7.  History of breast CA: diagnosed in 2015, followed by Dr. Jana Fernandez; currently stable   Medication Adjustments/Labs and Tests Ordered: Current medicines are reviewed at length with the patient today.  Concerns regarding medicines are outlined above.  Medication changes, Labs and Tests ordered today are listed in the Patient Instructions below. Patient Instructions  Medication Instructions:  Take metoprolol tartrate 37.5m in the evening before work and 260min the morning Continue other current medications  *If you need a refill on your cardiac medications before your next appointment, please call your pharmacy*  Lab Work: FARyanlipid panel If you have labs (blood work) drawn today and your tests are completely normal, you will receive your results only by: . Marland KitchenyChart Message (if you have MyChart) OR . A paper copy in the mail If you have any lab test that is abnormal or we need to change your treatment, we will call you to review the results.  Testing/Procedures: NONE  Follow-Up: At [facility]you and your health needs are our priority.  As part of our continuing mission to provide you with exceptional heart care, we have created designated Provider Care Teams.  These Care Teams include your primary Cardiologist (physician) and Advanced Practice Providers (APPs -  Physician Assistants and Nurse Practitioners) who all work together to provide you with the care you need, when  you need it.  Your next appointment:   4 month(s)  The format for your next appointment:   In Person  Provider:   You may see Dr. Claiborne Billings or one of the following Advanced Practice Providers on your designated Care Team:    Almyra Deforest, PA-C  Fabian Sharp, PA-C or   Roby Lofts, [place]   Other Instructions      Signed, Shelva Majestic, MD  06/28/2019 2:16 PM    [facility] [address], [place], [place], [place]  [address] Phone: [phone]

## 2019-06-27 NOTE — Patient Instructions (Signed)
Medication Instructions:  Take metoprolol tartrate 37.5mg  in the evening before work and 25mg  in the morning Continue other current medications  *If you need a refill on your cardiac medications before your next appointment, please call your pharmacy*  Lab Work: [place], lipid panel If you have labs (blood work) drawn today and your tests are completely normal, you will receive your results only by: Marland Kitchen MyChart Message (if you have MyChart) OR . A paper copy in the mail If you have any lab test that is abnormal or we need to change your treatment, we will call you to review the results.  Testing/Procedures: NONE  Follow-Up: At [facility], you and your health needs are our priority.  As part of our continuing mission to provide you with exceptional heart care, we have created designated Provider Care Teams.  These Care Teams include your primary Cardiologist (physician) and Advanced Practice Providers (APPs -  Physician Assistants and Nurse Practitioners) who all work together to provide you with the care you need, when you need it.  Your next appointment:   4 month(s)  The format for your next appointment:   In Person  Provider:   You may see Dr. Claiborne Billings or one of the following Advanced Practice Providers on your designated Care Team:    Almyra Deforest, PA-C  Fabian Sharp, PA-C or   Roby Lofts, [place]   Other Instructions

## 2019-06-28 ENCOUNTER — Encounter: Payer: Self-pay | Admitting: Cardiovascular Disease

## 2019-09-09 ENCOUNTER — Other Ambulatory Visit (HOSPITAL_COMMUNITY)
Admission: RE | Admit: 2019-09-09 | Discharge: 2019-09-09 | Disposition: A | Discharge status: Home / Self Care | Payer: BC Managed Care – PPO | Source: Ambulatory Visit | Attending: Pulmonary Disease | Admitting: Pulmonary Disease

## 2019-09-09 DIAGNOSIS — Z20822 Contact with and (suspected) exposure to covid-19: Secondary | ICD-10-CM | POA: Diagnosis not present

## 2019-09-09 DIAGNOSIS — Z01812 Encounter for preprocedural laboratory examination: Secondary | ICD-10-CM | POA: Insufficient documentation

## 2019-09-09 LAB — SARS CORONAVIRUS 2 (TAT 6-24 HRS): SARS Coronavirus 2: NEGATIVE

## 2019-09-12 ENCOUNTER — Ambulatory Visit (INDEPENDENT_AMBULATORY_CARE_PROVIDER_SITE_OTHER): Payer: BC Managed Care – PPO | Admitting: Pulmonary Disease

## 2019-09-12 ENCOUNTER — Encounter: Payer: Self-pay | Admitting: Pulmonary Disease

## 2019-09-12 ENCOUNTER — Other Ambulatory Visit: Payer: Self-pay

## 2019-09-12 ENCOUNTER — Other Ambulatory Visit: Payer: Self-pay | Admitting: Pulmonary Disease

## 2019-09-12 DIAGNOSIS — J432 Centrilobular emphysema: Secondary | ICD-10-CM | POA: Diagnosis not present

## 2019-09-12 DIAGNOSIS — Z720 Tobacco use: Secondary | ICD-10-CM

## 2019-09-12 DIAGNOSIS — I2510 Atherosclerotic heart disease of native coronary artery without angina pectoris: Secondary | ICD-10-CM

## 2019-09-12 LAB — PULMONARY FUNCTION TEST
DL/VA % pred: 54 %
DL/VA: 2.3 ml/min/mmHg/L
DLCO unc % pred: 50 %
DLCO unc: 10.63 ml/min/mmHg
FEF 25-75 Post: 1.24 L/sec
FEF 25-75 Pre: 0.97 L/sec
FEF2575-%Change-Post: 27 %
FEF2575-%Pred-Post: 49 %
FEF2575-%Pred-Pre: 38 %
FEV1-%Change-Post: 6 %
FEV1-%Pred-Post: 73 %
FEV1-%Pred-Pre: 68 %
FEV1-Post: 1.98 L
FEV1-Pre: 1.86 L
FEV1FVC-%Change-Post: 3 %
FEV1FVC-%Pred-Pre: 82 %
FEV6-%Change-Post: 3 %
FEV6-%Pred-Post: 86 %
FEV6-%Pred-Pre: 83 %
FEV6-Post: 2.91 L
FEV6-Pre: 2.81 L
FEV6FVC-%Change-Post: 0 %
FEV6FVC-%Pred-Post: 102 %
FEV6FVC-%Pred-Pre: 101 %
FVC-%Change-Post: 3 %
FVC-%Pred-Post: 84 %
FVC-%Pred-Pre: 82 %
FVC-Post: 2.95 L
FVC-Pre: 2.86 L
Post FEV1/FVC ratio: 67 %
Post FEV6/FVC ratio: 99 %
Pre FEV1/FVC ratio: 65 %
Pre FEV6/FVC Ratio: 98 %
RV % pred: 108 %
RV: 2.16 L
TLC % pred: 98 %
TLC: 5.14 L

## 2019-09-12 NOTE — Assessment & Plan Note (Signed)
Blood pressure is running low last 2 visits. She is on metoprolol as well as amlodipine, perhaps she can stop amlodipine I asked her to discuss  with cardiologist

## 2019-09-12 NOTE — Progress Notes (Signed)
   Subjective:    Patient ID: Stacy Fernandez, female    DOB: [birth year], 59 y.o.   MRN: [id number]  HPI  59 yo heavy ex-smokerfor FU ofCOPD Her sister was Stacy Fernandez She smoked more than 30-pack-years and quit in 03/2014. She underwent prolonged chemotherapy and hormonal therapy for breast cancer.She also developed pneumonia during this.  She has a history of Nissen's fundoplication in [id number]   Covid infection 10/2018 with residual fatigue and worsening dyspnea On her last visit 05/2019 she was given at trial of prednisone and changed from Anoro to Trelegy -this is worked well for her.  Also given a course of prednisone, she felt well while she was on prednisone We reviewed PFTs today. Obtain history from her husband  She is back to smoking cigarettes, last time she was able to quit by vaping and is now worried about vaping because of the dangers associated.  Blood pressure running low, she is on metoprolol and amlodipine  Significant tests/ events reviewed  PFTs 2/21-ratio 65, FEV1 68%/1.86, FVC 2.86/82%, TLC 98%, DLCO 10.6/50%, no bronchodilator response   Left heart cath -no critical CAD  CT chest in 2014 had suggested endobronchial nodule in the right mainstem,bronchoscopy was nondiagnostic,follow-up CT chest in 2016 showed resolution of these nodules,also PET scan in 06/2015 was negative  CPET5/2014 showed mildly reduced functional status with O2 of 76% predicted.  Spirometry 11/2012 -moderate airway obstruction with FEV1 of 58% FVC of 71% and ratio 65, diffusion was 38%   Spirometry5/2016ratio 65, FEV1 61%, FVC 75%   Review of Systems Patient denies significant dyspnea,cough, hemoptysis,  chest pain, palpitations, pedal edema, orthopnea, paroxysmal nocturnal dyspnea, lightheadedness, nausea, vomiting, abdominal or  leg pains      Objective:   Physical Exam  Gen. Pleasant, well-nourished, in no distress ENT - no thrush, no pallor/icterus,no post  nasal drip Neck: No JVD, no thyromegaly, no carotid bruits Lungs: no use of accessory muscles, no dullness to percussion, decreased  without rales or rhonchi  Cardiovascular: Rhythm regular, heart sounds  normal, no murmurs or gallops, no peripheral edema Musculoskeletal: No deformities, no cyanosis or clubbing        Assessment & Plan:

## 2019-09-12 NOTE — Progress Notes (Signed)
PFT done today. 

## 2019-09-12 NOTE — Assessment & Plan Note (Signed)
Lung function is no worse than prior in fact slightly improved so Covid has not affected her lungs adversely. Stay on Trelegy Smoking cessation most important intervention

## 2019-09-12 NOTE — Assessment & Plan Note (Signed)
Discussed dangers of vaping but this may be the only way she can quit again. We can perhaps try Wellbutrin, since she is working third shift now she is not so much worried about insomnia

## 2019-09-12 NOTE — Patient Instructions (Signed)
Lung function is same as before Stay on trelegy  You have to Stacy Fernandez cigarettes Ask cardiologist if you can stop amlodipin

## 2019-09-28 DIAGNOSIS — Z23 Encounter for immunization: Secondary | ICD-10-CM | POA: Diagnosis not present

## 2019-10-12 DIAGNOSIS — Z0000 Encounter for general adult medical examination without abnormal findings: Secondary | ICD-10-CM | POA: Diagnosis not present

## 2019-10-12 DIAGNOSIS — F418 Other specified anxiety disorders: Secondary | ICD-10-CM | POA: Diagnosis not present

## 2019-10-12 DIAGNOSIS — Z8616 Personal history of COVID-19: Secondary | ICD-10-CM | POA: Diagnosis not present

## 2019-10-12 DIAGNOSIS — F3341 Major depressive disorder, recurrent, in partial remission: Secondary | ICD-10-CM | POA: Diagnosis not present

## 2019-10-12 DIAGNOSIS — Z13220 Encounter for screening for lipoid disorders: Secondary | ICD-10-CM | POA: Diagnosis not present

## 2019-10-12 DIAGNOSIS — E039 Hypothyroidism, unspecified: Secondary | ICD-10-CM | POA: Diagnosis not present

## 2019-10-12 DIAGNOSIS — E559 Vitamin D deficiency, unspecified: Secondary | ICD-10-CM | POA: Diagnosis not present

## 2019-10-28 DIAGNOSIS — Z23 Encounter for immunization: Secondary | ICD-10-CM | POA: Diagnosis not present

## 2019-11-10 ENCOUNTER — Encounter: Payer: Self-pay | Admitting: Cardiovascular Disease

## 2019-11-10 ENCOUNTER — Telehealth: Payer: Self-pay

## 2019-11-10 ENCOUNTER — Telehealth (INDEPENDENT_AMBULATORY_CARE_PROVIDER_SITE_OTHER): Payer: BC Managed Care – PPO | Admitting: Cardiovascular Disease

## 2019-11-10 VITALS — BP 147/91 | HR 96 | Ht 66.5 in | Wt 160.0 lb

## 2019-11-10 DIAGNOSIS — I2510 Atherosclerotic heart disease of native coronary artery without angina pectoris: Secondary | ICD-10-CM

## 2019-11-10 DIAGNOSIS — R002 Palpitations: Secondary | ICD-10-CM

## 2019-11-10 DIAGNOSIS — E782 Mixed hyperlipidemia: Secondary | ICD-10-CM

## 2019-11-10 DIAGNOSIS — R0789 Other chest pain: Secondary | ICD-10-CM

## 2019-11-10 DIAGNOSIS — Z720 Tobacco use: Secondary | ICD-10-CM

## 2019-11-10 DIAGNOSIS — I2583 Coronary atherosclerosis due to lipid rich plaque: Secondary | ICD-10-CM

## 2019-11-10 DIAGNOSIS — E785 Hyperlipidemia, unspecified: Secondary | ICD-10-CM

## 2019-11-10 DIAGNOSIS — F17210 Nicotine dependence, cigarettes, uncomplicated: Secondary | ICD-10-CM | POA: Diagnosis not present

## 2019-11-10 DIAGNOSIS — Z853 Personal history of malignant neoplasm of breast: Secondary | ICD-10-CM

## 2019-11-10 DIAGNOSIS — Z20822 Contact with and (suspected) exposure to covid-19: Secondary | ICD-10-CM

## 2019-11-10 DIAGNOSIS — Z8679 Personal history of other diseases of the circulatory system: Secondary | ICD-10-CM

## 2019-11-10 DIAGNOSIS — Z8616 Personal history of COVID-19: Secondary | ICD-10-CM

## 2019-11-10 DIAGNOSIS — K30 Functional dyspepsia: Secondary | ICD-10-CM

## 2019-11-10 MED ORDER — ICOSAPENT ETHYL 1 G PO CAPS: 2.0000 g | ORAL_CAPSULE | Freq: Two times a day (BID) | ORAL | 3 refills | Status: DC

## 2019-11-10 MED ORDER — PANTOPRAZOLE SODIUM 40 MG PO TBEC: 40.0000 mg | DELAYED_RELEASE_TABLET | Freq: Every day | ORAL | 3 refills | Status: DC

## 2019-11-10 MED ORDER — EZETIMIBE 10 MG PO TABS: 10.0000 mg | ORAL_TABLET | Freq: Every day | ORAL | 3 refills | Status: DC

## 2019-11-10 MED ORDER — AMLODIPINE BESYLATE 5 MG PO TABS: 2.5000 mg | ORAL_TABLET | Freq: Every day | ORAL | 3 refills | Status: DC

## 2019-11-10 MED ORDER — METOPROLOL TARTRATE 25 MG PO TABS: ORAL_TABLET | ORAL | 3 refills | Status: DC

## 2019-11-10 NOTE — Progress Notes (Signed)
Virtual Visit via Telephone Note   This visit type was conducted due to national recommendations for restrictions regarding the COVID-19 Pandemic (e.g. social distancing) in an effort to limit this patient's exposure and mitigate transmission in our community.  Due to her co-morbid illnesses, this patient is at least at moderate risk for complications without adequate follow up.  This format is felt to be most appropriate for this patient at this time.  The patient did not have access to video technology/had technical difficulties with video requiring transitioning to audio format only (telephone).  All issues noted in this document were discussed and addressed.  No physical exam could be performed with this format.  Please refer to the patient's chart for her  consent to telehealth for [facility].   The patient was identified using 2 identifiers.  Date:  11/10/2019   ID:  Stacy Fernandez, DOB [birth year], MRN [id number]  Patient Location: Home Provider Location: Home  PCP:  Jettie Booze, NP  Cardiologist:  Shelva Majestic, MD Electrophysiologist:  None   Evaluation Performed:  Follow-Up Visit  Chief Complaint: Heartburn and vague chest pain  History of Present Illness:    Stacy Fernandez is a 59 y.o. female who I initially in 2014 for evaluation of chest pain. She has a history of tobacco use, migraine headaches as well as COPD/emphysema. In October 2013 a nuclear perfusion study showed normal perfusion and function. She also had a history of GERD with reflux and had undergone esophageal manometry.She developed chest pain leading to a [facility] long hospital presentation.She underwent diagnostic cardiac catheterization by Dr. Gwenlyn Found on December 25, 2012 which showed 40% ostial first diagonal branch stenosis of the LAD; all other vessels were normal. EF was 60%.She had continued to smoke cigarettes and I saw her in follow-up of her hospitalization in September 2014.  She  hasahistory of a history of breast CA and is followed by Dr. Jana Hakim. Apparently earlier this year both she and her husband develop symptoms of COVID-19. They believe that this may have been acquired in their church since 19 members tested positive and to subsequently died. Her husband developed COVID initially and was hospitalized at [facility] long for 3 weeks. She was admitted to [place] home due to her COVID in early April and was hospitalized for 1 week. The day of discharge is when the [place] open for COVID patients. Subsequent testing 2 weeks later still revealed her to be COVID positive and it was not until a May test that she tested COVID negative. Subsequent antibody testing revealed that she had a strong positive antibody response.  Due to a 2 to 4-week period of increasing palpitations as well as some vague discomfort in her chest described as a tightness she was referred for a telemedicine evaluation with me on January 05, 2019.  During that evaluation she states that her levothyroxine dose had been reduced from 175 down to 125 mcg.  I recommended that she wear an event monitor for 2 weeks.  I initiated therapy with metoprolol tartrate initially at 12.5 mg twice a day.  Since I did not know her blood pressure I did not initiate at a higher dose.  At the time she was drinking 1 to 2 cups of coffee per day and discontinuance of all caffeine was recommended.  In light of her previous catheterization in 2014 which showed mild nonobstructive disease with her longstanding tobacco history I recommended she undergo CT coronary angiogram to further evaluate  her chest tightness and assess for progressive CAD.  I also scheduled her for 2D echo Doppler study and follow-up chest x-ray.  We discussed the importance of complete smoking cessation.   Her 2D echo Doppler study was done on January 09, 2019 which showed an EF of 60 to 65%.  He had normal diastolic parameters.  There was mild aortic sclerosis  and mild thickening of mitral valve leaflets.  Both atrium were normal in size.  A 2-week monitor from June 29 through January 29, 2019 was essentially unremarkable.  She was predominantly in sinus rhythm July 6 at 4:47 AM with a rate 56 and a maximum heart rate on January 28, 2019 at 7:49 PM at 126 bpm which was sinus tachycardia.  She underwent CT coronary angiography.  Calcium score was increased to 514 which was 99th percentile for age and sex matched control.  There is moderate calcified plaque in the proximal LAD with 50 to 69% stenosis as well as moderate calcified plaque in the proximal OM with 50 to 69% stenosis.  FFR was sent but results are still pending.  She has had issues with somewhat low blood pressure.  Laboratory from January 16, 2019 showed increased total cholesterol at 202, triglycerides 228, HDL 39, LDL 117.  Rosuvastatin 10 mg was initiated and she has been on therapy for approximately 3 weeks and tolerating this well.    Since I last saw her, her FFR analysis was essentially negative with mean at 0.99, proximal LAD 0.98, mid LAD 0.84, however the distal LAD was 0.75.  The circumflex and RCA FFR's were all normal at 0.9 and above.  At that time, continued medical therapy was recommended.  When I last saw her on April 17, 2019 she was experiencing occasional palpitations and admitted to episodes of chest pressure.   Oftentimes her chest pressure may last 10 to 15 minutes and resolve spontaneously.  During that evaluation, in light of her increasing symptomatology I recommended definitive repeat cardiac catheterization.  Catheterization was done on April 28, 2019 which revealed predominant single-vessel CAD with calcification of the proximal to mid LAD with 20% ostial diagonal stenosis, 30% stenosis of the LAD immediately after the diagonal vessel, and 60% smooth mildly calcified stenosis of the mid LAD and a slight bend in the vessel proximal to the second diagonal takeoff with mild 10%  LAD stenosis beyond.  The LAD was large caliber and wrapped around the apex.  Her circumflex and dominant RCA were normal.  Following her catheterization I initiated amlodipine 2.5 mg at bedtime since she did not tolerate isosorbide initiation secondary to precipitation of migraine headaches.  Also adjusted beta-blocker therapy with metoprolol tartrate 25 mg twice a day, and increase rosuvastatin to 40 mg daily in attempt to induce plaque regression.  I strongly advised smoking cessation.  I last saw her in an office visit on June 27, 2019.  Subsequent to her catheterization she had felt improved.  She still works third shift.  She reduced her tobacco but still smokes 2 to 3 cigarettes/day.  At times she still experiences some mild chest discomfort.  Typically she gets home from work at 7:30 AM, sleeps, and goes to work at 10 PM.  Over the past 5 months, unfortunately Ms. Braund has continued to smoke cigarettes and has not been able to quit.  Over the past several days she has had several days where she feels a sensation of heartburn.  This typically occurs when she is working on  her night shift.  She denies any significant exertional precipitation.  She is status post remote Nissen fundoplication for bad reflux disease over 20 years ago.  In addition to the heartburn sensation at times she feels a tightness in her chest.  She does wheeze occasionally and has a prescription to take albuterol as needed.  She recently had lab work done by her primary physician which showed a total cholesterol of 191 triglyceride 339 HDL 37 VLDL 57 and LDL 97.  No adjustment was made to her medical therapy.  She also has noticed some blood pressure lability and at times her pulse remains fast.  She presents for a telemedicine evaluation.  The patient does not have symptoms concerning for COVID-19 infection (fever, chills, cough, or new shortness of breath).    Past Medical History:  Diagnosis Date  . Anxiety   . 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 Chemotherapy-induced peripheral neuropathy (HCC)    FEET  . 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 CKD (chronic kidney disease), stage III   . COPD with emphysema [facility]) pulmologist--  dr Elsworth Soho   advanded emphysema per CT   . History of chemotherapy    left breast ca-- 05-09-2015   to 04-30-2015  . History of gastroesophageal reflux (GERD)    no problems since Nissen fundoplication  . Hypothyroidism   . Rectovaginal fistula   . Skin changes related to chemotherapy    ARMS  . Supraclinoid carotid artery aneurysm, small neurologist-  dr willis/  dr Krista Blue   left internal - 2 mm;  aneurysm  vs.  infundibulum--  residual chronic migraine  . Wears dentures    upper  . Wears glasses    Past Surgical History:  Procedure Laterality Date  . APPENDECTOMY  1992  . BREAST RECONSTRUCTION WITH PLACEMENT OF TISSUE EXPANDER AND FLEX HD (ACELLULAR HYDRATED DERMIS) Bilateral 09/25/2014   Procedure: PLACEMENT OF BILATERAL TISSUE EXPANDER AND  ACELLULAR DERMIS FOR BREAST RECONSTRUCTION ;  Surgeon: [name], MD;  Location: [place];  Service: Plastics;  Laterality: Bilateral;  . CHOLECYSTECTOMY  1999  . ESOPHAGOGASTRODUODENOSCOPY (EGD) WITH PROPOFOL  01/18/2013  . LAPAROSCOPIC NISSEN FUNDOPLICATION  [id number]  . LEFT HEART CATH AND CORONARY ANGIOGRAPHY N/A 04/28/2019   Procedure: LEFT HEART CATH AND CORONARY ANGIOGRAPHY;  Surgeon: Troy Sine, MD;  Location: [place] CV LAB;  Service: Cardiovascular;  Laterality: N/A;  . LEFT HEART CATHETERIZATION WITH CORONARY ANGIOGRAM N/A 12/26/2012   Procedure: LEFT HEART CATHETERIZATION WITH CORONARY ANGIOGRAM;  Surgeon: Lorretta Harp, MD;  Location: [facility] CATH LAB;   Service: Cardiovascular;  Laterality: N/A;   Nonobstructive CAD/  40-50% ostial D1/  normal LVF, ef 60%  . LIPOSUCTION WITH LIPOFILLING Bilateral 12/18/2014   Procedure: LIPOFILLING TO BILATERAL CHEST;  Surgeon: [name], MD;  Location: [place];  Service: Plastics;  Laterality: Bilateral;  . PORT-A-CATH REMOVAL Right 06/28/2015   Procedure: REMOVAL PORT-A-CATH;  Surgeon: Leighton Ruff, MD;  Location: [facility];  Service: General;  Laterality: Right;  . PORTACATH PLACEMENT N/A 04/02/2014   Procedure: INSERTION PORT-A-CATH;  Surgeon: [name], MD;  Location: [place];  Service: General;  Laterality: N/A;  .  REMOVAL OF BILATERAL TISSUE EXPANDERS WITH PLACEMENT OF BILATERAL BREAST IMPLANTS Bilateral 12/18/2014   Procedure: REMOVAL OF BILATERAL TISSUE EXPANDERS,PLACEMENT SILICONE IMPLANTS ;  Surgeon: [name], MD;  Location: [place];  Service: Plastics;  Laterality: Bilateral;  . SIMPLE MASTECTOMY WITH AXILLARY SENTINEL NODE BIOPSY Bilateral 04/02/2014   Procedure: LEFT TOTAL MASTECTOMY WITH LEFT AXILLARY SENTINEL NODE BIOPSY, RIGHT PROPHYLACTIC MASTETCTOMY;  Surgeon: [name], MD;  Location: [place];  Service: General;  Laterality: Bilateral;  . TOTAL ABDOMINAL HYSTERECTOMY W/ BILATERAL SALPINGOOPHORECTOMY  1996  . TRANSTHORACIC ECHOCARDIOGRAM  02-04-2015   grade 1 diastolic dysfunction, ef [id number]  trivial TR  . VESICO-VAGINAL FISTULA REPAIR N/A 07/17/2015   Procedure: REPAIR OF RECTOVAGINAL FISTULA;  Surgeon: [name], MD;  Location: [facility];  Service: Gynecology;  Laterality: N/A;  . VIDEO BRONCHOSCOPY Bilateral 01/16/2013   Procedure: VIDEO BRONCHOSCOPY WITHOUT FLUORO;  Surgeon: [name], MD;  Location: [facility];  Service: Cardiopulmonary;  Laterality: Bilateral;     Current Meds  Medication Sig  . acetaminophen (TYLENOL) 325 MG tablet Take 2 tablets (650 mg total) by mouth every 6 (six) hours as needed for mild  pain or headache (fever >/= 101).  Marland Kitchen albuterol (PROVENTIL) (2.5 MG/3ML) 0.083% nebulizer solution Take 3 mLs (2.5 mg total) by nebulization every 6 (six) hours as needed for wheezing or shortness of breath.  . ALPRAZolam (XANAX) 0.25 MG tablet Take 0.125 mg by mouth daily.  Marland Kitchen aspirin EC 81 MG tablet Take 1 tablet (81 mg total) by mouth daily. (Patient taking differently: Take 81 mg by mouth daily. Chewable)  . Diclofenac Potassium,Migraine, 50 MG PACK Take 50 mg by mouth. As Needed for Migraine  . Fluticasone-Umeclidin-Vilant (TRELEGY ELLIPTA) 100-62.5-25 MCG/INH AEPB Inhale 1 puff into the lungs daily.  Marland Kitchen levothyroxine (SYNTHROID, LEVOTHROID) 125 MCG tablet Take 125 mcg by mouth daily before breakfast.  . metoprolol tartrate (LOPRESSOR) 25 MG tablet Take 1 tablet (25mg ) by mouth in the morning and 1.5 tablets (37.5mg ) in the evening.  . montelukast (SINGULAIR) 10 MG tablet Take 10 mg by mouth at bedtime.   Marland Kitchen PROAIR HFA 108 (90 BASE) MCG/ACT inhaler Inhale 2 puffs into the lungs every 4 (four) hours as needed for wheezing or shortness of breath. Reported on 07/08/2015  . vitamin C (VITAMIN C) 500 MG tablet Take 1 tablet (500 mg total) by mouth daily.  Marland Kitchen zinc sulfate 220 (50 Zn) MG capsule Take 1 capsule (220 mg total) by mouth daily.     Allergies:   Prednisone   Social History   Tobacco Use  . Smoking status: Current Every Day Smoker    Packs/day: 0.00    Years: 37.00    Pack years: 0.00    Types: Cigarettes, E-cigarettes    Last attempt to quit: 03/29/2014    Years since quitting: 5.6  . Smokeless tobacco: Never Used  . Tobacco comment: 5-7 cigs daily 06/07/17  Substance Use Topics  . Alcohol use: No  . Drug use: No     Family Hx: The patient's family history includes Breast cancer (age of onset: 81) in her paternal aunt; Cancer in her paternal grandfather; Colon cancer (age of onset: 45) in her paternal grandmother; Colon cancer (age of onset: 85) in her father; Heart attack in  her maternal grandmother; Heart failure in her mother; Ovarian cancer (age of onset: 30) in her sister; Ovarian cancer (age of onset: 10) in an other family member; Throat cancer (age of onset: 18) in  her brother; Throat cancer (age of onset: 43) in her brother.  ROS:   Please see the history of present illness.    She denies fevers chills or night sweats. Positive for heartburn Occasional wheezing At times mild nonexertional chest pain No swelling Fatigue since her Covid infection Sleep not great.  She works the third shift and sleeps during the day All other systems reviewed and are negative.   Prior CV studies:   The following studies were reviewed today:  ECHO IMPRESSIONS 01/09/2019 1. The left ventricle has normal systolic function with an ejection fraction of 60-65%. The cavity size was normal. Left ventricular diastolic parameters were normal. 2. The right ventricle has normal systolic function. The cavity was normal. There is no increase in right ventricular wall thickness. 3. Mild thickening of the mitral valve leaflet. 4. The aortic valve is tricuspid. Mild thickening of the aortic valve. Mild calcification of the aortic valve.   Cardiac CT IMPRESSION:02/02/2019  1. Coronary calcium score of 514. This was 99th percentile for age and sex matched control.  2. Normal coronary origin with right dominance.  3. Moderate calcified plaque in the proximal LAD with 50-69%% stenosis. CAD RAD 3.  4. Moderate calcified plaque in the proximal OM with 50-69% stenosis. CAD RAD 3  5. FFR has been sent to evaluate LAD and OM.  6. Recommend Aggressive Risk Factor Modification.  CT FFR ANALYSIS FINDINGS: FFRct analysis was performed on the original cardiac CT angiogram dataset. Diagrammatic representation of the FFRct analysis is provided in a separate PDF document in PACS. This dictation was created using the PDF document and an interactive 3D model of the results. 3D  model is not available in the EMR/PACS. Normal FFR range is >0.80.  1. Left Main: No significant stenosis. FFR LM = 0.99  2. LAD: No significant stenosis. Proximal FFR = 0.98, Mid FFR = 0.84, Distal FFR = 0.75. 3. LCX: No significant stenosis. Proximal FFR = 0.98, Mid FFR = 0.96, Distal FFR = 0.93. 4. RCA: No significant stenosis. Proximal FFR = 0.99, Mid FFR = 0.95, Distal FFR = 0.91. PDA not assessed.  IMPRESSION: 1. CT FFR analysis showed a borderline hemodynamically significant reduction in flow in the distal LAD. This represents either diffuse distal disease or distal tapering of the vessel.  2. Recommend medical management and aggressive risk factor Modification.   CARDIAC CATH: 04/28/2019  Prox LAD to Mid LAD lesion is 30% stenosed.  Mid LAD-1 lesion is 60% stenosed.  Mid LAD-2 lesion is 10% stenosed.  The left ventricular systolic function is normal.  LV end diastolic pressure is low.  1st Diag lesion is 20% stenosed.  Single-vessel CAD with calcification of the proximal to mid LAD with 20% ostial diagonal stenosis with 30% stenosis of the LAD immediately after the diagonal vessel, 60% smooth mildly calcified stenosis of the LAD in a slight bend in the vessel proximal to the second diagonal takeoff with mild smooth 10% LAD stenosis in the mid LAD. The LAD is a large caliber and wraps around the LV apex.  Normal left circumflex and dominant RCA.  No LV function with EF estimate at least 55%. LVEDP 9 mm.  RECOMMENDATION: Medical therapy trial. The patient did not tolerate isosorbide initiation secondary to precipitation migraine headaches; will initiate amlodipine 2.5 mg at bedtime and adjust beta-blocker therapy with metoprolol tartrate to 25 mg twice a day. Increase rosuvastatin to 40 mg daily in attempt to induce plaque regression. Smoking cessation is imperative.  I thoroughly reviewed the patient's most recent cardiac catheterization  findings with the patient again today  Labs/Other Tests and Data Reviewed:    EKG:    Recent Labs: 01/16/2019: Magnesium 2.2; TSH 4.610 05/15/2019: ALT 16; BUN 12; Creatinine 0.82; Hemoglobin 12.9; Platelet Count 241; Potassium 4.2; Sodium 142   Recent Lipid Panel Lab Results  Component Value Date/Time   CHOL 132 03/24/2019 08:42 AM   TRIG 228 (H) 03/24/2019 08:42 AM   HDL 40 03/24/2019 08:42 AM   CHOLHDL 3.3 03/24/2019 08:42 AM   CHOLHDL 4.4 12/26/2012 03:40 AM   LDLCALC 55 03/24/2019 08:42 AM    Wt Readings from Last 3 Encounters:  11/10/19 160 lb (72.6 kg)  09/12/19 164 lb 6.4 oz (74.6 kg)  06/27/19 167 lb 3.2 oz (75.8 kg)     Objective:    Vital Signs:  Ht 5' 6.5" (1.689 m)   Wt 160 lb (72.6 kg)   BMI 25.44 kg/m    Blood pressure today which she had taken after the nurse had called her was 147/91 with a heart rate of 96  Prior blood pressure on March 25 was 125/73 with a pulse of 79.  Since this was a virtual visit I could not physically examine the patient. There was no audible wheezing Breathing was normal and nonlabored Heart rate was regular but her heart rate typically has been running in the 90s At times she complains of stomachache    ASSESSMENT & PLAN:    1. CAD: Catheterization findings of October 2020 again reviewed with the patient today.  She could not tolerate nitrate therapy secondary to migraines.  She recently has had "heartburn" like symptoms but also has felt some chest tightness.  I will further titrate metoprolol to 37.5 mg twice a day for improved rate control and potential anti-ischemic benefit.  I am also recommending resumption of amlodipine 2.5 mg to take before going to work.  This apparently had been stopped by her pulmonologist during an office visit when her blood pressure was low normal. 2. Mixed hyperlipidemia: Most recent triglycerides elevated at 339, VLDL 57.  With her CAD LDL was not at target at 97.  She has been on rosuvastatin  40 mg.  I am adding Zetia 10 mg which should provide an additional 20% reduction in her LDL cholesterol.  I am also recommending the addition of Vascepa 2 capsules twice a day and discussed with her the "reduce it trial "data.  If insurance is unable to cover we will then treat her with low vase a. 3. Indigestion: She has a history of significant esophageal erosions and remotely had undergone a Nissen fundoplication procedure in 1999 by her recollection.  I am adding pantoprazole 40 mg daily to her medical regimen. 4. Tobacco history: Unfortunately she continues to smoke cigarettes.  I again discussed the importance of absolute smoking cessation 5. Covid infection: April 2020.  Continued fatigability since. 6. History of palpitations, improved with beta-blocker therapy, as noted above we will further titrate this up heart rate at 96 and blood pressure lability. 7. History of breast cancer: Diagnosed in 2015 followed by Dr. Jana Hakim  COVID-19 Education: The signs and symptoms of COVID-19 were discussed with the patient and how to seek care for testing (follow up with PCP or arrange E-visit).  The importance of social distancing was discussed today.  Time:   Today, I have spent 25 minutes with the patient with telehealth technology discussing the above problems.  Medication Adjustments/Labs and Tests Ordered: Current medicines are reviewed at length with the patient today.  Concerns regarding medicines are outlined above.   Tests Ordered: No orders of the defined types were placed in this encounter.   Medication Changes: No orders of the defined types were placed in this encounter.   Follow Up: In office visit in 3 months  Signed, Shelva Majestic, MD  11/10/2019 8:22 AM    [place]

## 2019-11-10 NOTE — Patient Instructions (Signed)
Medication Instructions:  BEGIN TAKING PANTOPRAZOLE 40MG  DAILY  BEING TAKING ZETIA 10MG  DAILY  BEING TAKING VASCEPA 2 CAPSULES TWICE A DAY  BEGIN TAKING AMLODIPINE 2.5MG  DAILY BEFORE WORK  INCREASE YOUR METOPROLOL TO 37.5MG  TWICE A DAY  *If you need a refill on your cardiac medications before your next appointment, please call your pharmacy*    Follow-Up: At [facility], you and your health needs are our priority.  As part of our continuing mission to provide you with exceptional heart care, we have created designated Provider Care Teams.  These Care Teams include your primary Cardiologist (physician) and Advanced Practice Providers (APPs -  Physician Assistants and Nurse Practitioners) who all work together to provide you with the care you need, when you need it.  We recommend signing up for the patient portal called "MyChart".  Sign up information is provided on this After Visit Summary.  MyChart is used to connect with patients for Virtual Visits (Telemedicine).  Patients are able to view lab/test results, encounter notes, upcoming appointments, etc.  Non-urgent messages can be sent to your provider as well.   To learn more about what you can do with MyChart, go to [email or web address].    Your next appointment:   3 month(s)  The format for your next appointment:   In Person  Provider:   Shelva Majestic, MD

## 2019-11-10 NOTE — Telephone Encounter (Signed)
Reviewed AVS with pt from VV today with Dr.Kelly. pt verbalized understanding with medication additions and follow up. No other questions at this time. Will mail AVS

## 2019-11-15 ENCOUNTER — Telehealth: Payer: Self-pay

## 2019-11-15 NOTE — Telephone Encounter (Signed)
Prior authorization for Stacy Fernandez has been approved.  For patients covered by Medicare Part D, you may submit a tier exception form to the plan or PBM. If approved by the plan or PBM, it may reduce the patient's out-of-pocket costs. Type "tier exception" in the "plan or PBM" field on the Form Pick page to view eligible tier exception forms.  Message from plan: [id number];Review Type:Prior Auth;Coverage Start Date:10/16/2019;Coverage End Date:11/14/2022;

## 2019-12-26 ENCOUNTER — Telehealth: Payer: Self-pay | Admitting: Cardiovascular Disease

## 2019-12-26 NOTE — Telephone Encounter (Signed)
New message   1. What dental office are you calling from? Dr. Arita Miss [facility]   What is your office phone number? [phone]  2. What is your fax number?[phone]  3. What type of procedure is the patient having performed? 1 tooth extraction  4. What date is procedure scheduled or is the patient there now? TBD (if the patient is at the dentist's office question goes to their cardiologist if he/she is in the office.  If not, question should go to the DOD).   5. What is your question (ex. Antibiotics prior to procedure, holding medication-we need to know how long dentist wants pt to hold med)? Wants to know whether provider would prefer patient to go to an oral surgeon. Wants to know if okay to epinephrine.

## 2019-12-27 NOTE — Telephone Encounter (Signed)
   Primary Cardiologist: Shelva Majestic, MD  Chart reviewed as part of pre-operative protocol coverage.   Simple dental extractions are considered low risk procedures per guidelines and generally do not require any specific cardiac clearance. It is also generally accepted that for simple extractions and dental cleanings, there is no need to interrupt blood thinner therapy.  SBE prophylaxis is not required for the patient.  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 Ronnald Ramp. If epinephrine can be avoided, that would be ideal.    I will route this recommendation to the requesting party via Epic fax function and remove from pre-op pool.  Please call with questions.  Darreld Mclean, PA-C 12/27/2019, 8:10 AM

## 2020-01-08 ENCOUNTER — Ambulatory Visit: Payer: BC Managed Care – PPO | Admitting: Adult Health

## 2020-01-15 ENCOUNTER — Ambulatory Visit (INDEPENDENT_AMBULATORY_CARE_PROVIDER_SITE_OTHER): Payer: BC Managed Care – PPO | Admitting: Acute Care

## 2020-01-15 ENCOUNTER — Other Ambulatory Visit: Payer: Self-pay

## 2020-01-15 ENCOUNTER — Encounter: Payer: Self-pay | Admitting: Acute Care

## 2020-01-15 DIAGNOSIS — F17210 Nicotine dependence, cigarettes, uncomplicated: Secondary | ICD-10-CM

## 2020-01-15 DIAGNOSIS — J441 Chronic obstructive pulmonary disease with (acute) exacerbation: Secondary | ICD-10-CM | POA: Diagnosis not present

## 2020-01-15 NOTE — Progress Notes (Signed)
Virtual Visit via Telephone Note  I connected with Stacy Fernandez on 01/15/20 at  9:00 AM EDT by telephone and verified that I am speaking with the correct person using two identifiers.  Location: Patient: At home Provider: Working remotely from home   I discussed the limitations, risks, security and privacy concerns of performing an evaluation and management service by telephone and the availability of in person appointments. I also discussed with the patient that there may be a patient responsible charge related to this service. The patient expressed understanding and agreed to proceed.  Synopsis 59 year old ex smoker( Quit 2015 with > 30 pack year smoking history) with COPD.She resumed smoking 08/2019. 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 She did develop pneumonia while undergoing chemo.She has a history of Nissen's fundoplication in 9811.  She was + for Covid 10/2018 with residual fatigue and worsening dyspnea. She was transitioned from Anoro to Trelegy 07/2018, which worked well for her.   Last few visits she had low BP, and Dr. Elsworth Soho wanted her to speak with her cardiologist about discontinuing her Amlodipine.   History of Present Illness: Pt. Presents for 4 month follow up. She states she has had a sore throat with the Trelegy. She states she does rinse her mouth each time she uses it. She states she would like to switch to Anoro again. We discussed that the Anoro, would not have the medication combination she is getting with the Trelegy. She is willing to do a therapeutic trial of Breztri.Pt. states she had a one time flare of her COPD which resolved with using her nebs more. She did not see an MD. She has had issues with high blood pressure, and she has seen cards, and she has had multiple blood pressure meds added by cards. ( She has a follow up with cards 01/2020). Pt. States she has not had to use her nebs recently. She states she has  white/yellow secretions when she coughs. She is compliant with her Singulair daily. She denies fever, chest pain, orthopnea and hemoptysis. She has had both Covid Vaccines. She received Mederna. She is still smoking. She states she is smoking 6-8 cigarettes daily. She did try to transition to vaping, but she has returned to cigarettes. She would like referral to the [facility], as she has a significant family history of lung cancer.     Observations/Objective: Speaking in full complete sentences. No wheezing or distress. Rare cough.        Assessment and Plan: COPD Less Dyspnea with Trelegy, but she has a sore throat with the medication Would like to try a Therapeutic Trial with Breztri No significant Flares in last 4 months Plan We will do a therapeutic trial of Breztri.  Take 2 puffs twice daily Rinse mouth after use  Referral to the Lung Cancer Screening Program  Note your daily symptoms > remember "red flags" for COPD:  Increase in cough, increase in sputum production, increase in shortness of breath or activity intolerance. If you notice these symptoms, please call to be seen.   Please contact office for sooner follow up if symptoms do not improve or worsen or seek emergency care   Ongoing Tobacco Abuse Qualifies for Lung Cancer Screening Plan Referral to the [facility]  I have spent 3 minutes counseling patient on smoking cessation this visit. Patient verbalizes understanding that  Continuing to  smoking has  negative health consequences including worsening  of COPD, risk of lung cancer , stroke and heart disease..     Follow Up Instructions: Follow up in 2 weeks tele visit with Judson Roch to discuss if you like the [facility]. Follow up in 4 months with Dr. Elsworth Soho   I discussed the assessment and treatment plan with the patient. The patient was provided an opportunity to ask questions and all were answered. The patient agreed with the plan and  demonstrated an understanding of the instructions.   The patient was advised to call back or seek an in-person evaluation if the symptoms worsen or if the condition fails to improve as anticipated.  I provided 30 minutes of non-face-to-face time during this encounter.   Magdalen Spatz, NP 01/15/2020 9:31 AM

## 2020-01-15 NOTE — Patient Instructions (Addendum)
It is good to talk with you today.  We will do a therapeutic trial of Breztri.  Take 2 puffs twice daily Rinse mouth after use  Follow up in 2 weeks tele visit with Judson Roch to discuss if you like the [facility]. Follow up in 4 months with Dr. Elsworth Soho Referral to the Lung Cancer Screening Program  Note your daily symptoms > remember "red flags" for COPD:  Increase in cough, increase in sputum production, increase in shortness of breath or activity intolerance. If you notice these symptoms, please call to be seen.   Please contact office for sooner follow up if symptoms do not improve or worsen or seek emergency care

## 2020-03-13 ENCOUNTER — Telehealth: Payer: Self-pay | Admitting: Cardiovascular Disease

## 2020-03-13 NOTE — Telephone Encounter (Signed)
LVM FOR PATIENT TO RETURN CALL TO GET FOLLOW UP WITH KELLY RESCHEDULED ASAP

## 2020-03-14 ENCOUNTER — Telehealth: Payer: Self-pay | Admitting: Cardiovascular Disease

## 2020-03-14 NOTE — Telephone Encounter (Signed)
LVM FOR PATIENT TO RETURN CALL TO GET APPOINTMENT RESCHEDULED FOR DR.KELLY. NEEDS TO BE RESCHEDULED ASAP DUE TO THE FACT PATIENT IS SCHEDULED IN OVERBOOK SLOT AND NEEDS TO BE REMOVED FROM OVERBOOK

## 2020-03-20 ENCOUNTER — Ambulatory Visit: Payer: BC Managed Care – PPO | Admitting: Cardiovascular Disease

## 2020-04-05 ENCOUNTER — Other Ambulatory Visit: Payer: Self-pay

## 2020-04-05 ENCOUNTER — Encounter: Payer: Self-pay | Admitting: Cardiovascular Disease

## 2020-04-05 ENCOUNTER — Ambulatory Visit (INDEPENDENT_AMBULATORY_CARE_PROVIDER_SITE_OTHER): Payer: BC Managed Care – PPO | Admitting: Cardiovascular Disease

## 2020-04-05 DIAGNOSIS — Z720 Tobacco use: Secondary | ICD-10-CM

## 2020-04-05 DIAGNOSIS — E785 Hyperlipidemia, unspecified: Secondary | ICD-10-CM

## 2020-04-05 DIAGNOSIS — E039 Hypothyroidism, unspecified: Secondary | ICD-10-CM

## 2020-04-05 DIAGNOSIS — I2510 Atherosclerotic heart disease of native coronary artery without angina pectoris: Secondary | ICD-10-CM

## 2020-04-05 DIAGNOSIS — R002 Palpitations: Secondary | ICD-10-CM | POA: Diagnosis not present

## 2020-04-05 DIAGNOSIS — I2583 Coronary atherosclerosis due to lipid rich plaque: Secondary | ICD-10-CM

## 2020-04-05 DIAGNOSIS — Z853 Personal history of malignant neoplasm of breast: Secondary | ICD-10-CM

## 2020-04-05 DIAGNOSIS — I10 Essential (primary) hypertension: Secondary | ICD-10-CM

## 2020-04-05 DIAGNOSIS — Z20822 Contact with and (suspected) exposure to covid-19: Secondary | ICD-10-CM

## 2020-04-05 NOTE — Patient Instructions (Signed)
Medication Instructions:  CONTINUE WITH CURRENT MEDICATIONS. NO CHANGES.  *If you need a refill on your cardiac medications before your next appointment, please call your pharmacy*   Lab Work: FASTING LABS: CM[facility] LIPID TSH  If you have labs (blood work) drawn today and your tests are completely normal, you will receive your results only by: Marland Kitchen MyChart Message (if you have MyChart) OR . A paper copy in the mail If you have any lab test that is abnormal or we need to change your treatment, we will call you to review the results.    Follow-Up: At [facility], you and your health needs are our priority.  As part of our continuing mission to provide you with exceptional heart care, we have created designated Provider Care Teams.  These Care Teams include your primary Cardiologist (physician) and Advanced Practice Providers (APPs -  Physician Assistants and Nurse Practitioners) who all work together to provide you with the care you need, when you need it.  We recommend signing up for the patient portal called "MyChart".  Sign up information is provided on this After Visit Summary.  MyChart is used to connect with patients for Virtual Visits (Telemedicine).  Patients are able to view lab/test results, encounter notes, upcoming appointments, etc.  Non-urgent messages can be sent to your provider as well.   To learn more about what you can do with MyChart, go to [email or web address].    Your next appointment:   6 month(s)  The format for your next appointment:   In Person  Provider:   Shelva Majestic, MD   Other Instructions AMBULATORY REFERRAL TO [facility]

## 2020-04-05 NOTE — Progress Notes (Addendum)
Cardiology Office Note    Date:  04/07/2020   ID:  Stacy Fernandez, DOB 05-Feb-1961, MRN [id number]  PCP:  Jettie Booze, NP  Cardiologist:  Shelva Majestic, MD    History of Present Illness:  Stacy Fernandez is a 59 y.o. female  who presents to the office for 5 month cardiology evaluation in follow-up of her cardiac catheterization.  I initially saw Ms Stacy Fernandez in 2014 for evaluation of chest pain.  She has a history of tobacco use, migraine headaches as well as COPD/emphysema.  In October 2013 a nuclear perfusion study showed normal perfusion and function.  She also had a history of GERD with reflux and had undergone esophageal manometry.  She developed chest pain leading to a [facility] long hospital presentation.  She underwent diagnostic cardiac catheterization by Dr. Gwenlyn Found on December 25, 2012 which showed 40% ostial first diagonal branch stenosis of the LAD; all other vessels were normal.  EF was 60%.She had continued to smoke cigarettes and I saw her in follow-up of her hospitalization in September 2014.  She has a history of a history of breast CA and is followed by Dr. Jana Hakim.  Apparently earlier this year both she and her husband develop symptoms of COVID-19.  They believe that this may have been acquired in their church since 19 members tested positive and to subsequently died.  Her husband developed COVID initially and was hospitalized at [facility] long for 3 weeks.  She was admitted to [facility] home due to her COVID in early April and was hospitalized for 1 week.  The day of discharge is when the [facility] open for COVID patients.  Subsequent testing 2 weeks later still revealed her to be COVID positive and it was not until a May test that she tested COVID negative.  Subsequent antibody testing revealed that she had a strong positive antibody response.  Due to a 2 to 4-week period of increasing palpitations as well as some vague discomfort in her chest described as a tightness she was  referred for a telemedicine evaluation with me on January 05, 2019.  During that evaluation she states that her levothyroxine dose had been reduced from 175 down to 125 mcg.  I recommended that she wear an event monitor for 2 weeks.  I initiated therapy with metoprolol tartrate initially at 12.5 mg twice a day.  Since I did not know her blood pressure I did not initiate at a higher dose.  At the time she was drinking 1 to 2 cups of coffee per day and discontinuance of all caffeine was recommended.  In light of her previous catheterization in 2014 which showed mild nonobstructive disease with her longstanding tobacco history I recommended she undergo CT coronary angiogram to further evaluate her chest tightness and assess for progressive CAD.  I also scheduled her for 2D echo Doppler study and follow-up chest x-ray.  We discussed the importance of complete smoking cessation.   Her 2D echo Doppler study was done on January 09, 2019 which showed an EF of 60 to 65%.  He had normal diastolic parameters.  There was mild aortic sclerosis and mild thickening of mitral valve leaflets.  Both atrium were normal in size.  A 2-week monitor from June 29 through January 29, 2019 was essentially unremarkable.  She was predominantly in sinus rhythm July 6 at 4:47 AM with a rate 56 and a maximum heart rate on January 28, 2019 at 7:49 PM at 126 bpm which  was sinus tachycardia.  She underwent CT coronary angiography.  Calcium score was increased to 514 which was 99th percentile for age and sex matched control.  There is moderate calcified plaque in the proximal LAD with 50 to 69% stenosis as well as moderate calcified plaque in the proximal OM with 50 to 69% stenosis.  FFR was sent but results are still pending.  She has had issues with somewhat low blood pressure.  Laboratory from January 16, 2019 showed increased total cholesterol at 202, triglycerides 228, HDL 39, LDL 117.  Rosuvastatin 10 mg was initiated and she has been on therapy for  approximately 3 weeks and tolerating this well.    Since I last saw her, her FFR analysis was essentially negative with mean at 0.99, proximal LAD 0.98, mid LAD 0.84, however the distal LAD was 0.75.  The circumflex and RCA FFR's were all normal at 0.9 and above.  At that time, continued medical therapy was recommended.  When I saw her on April 17, 2019 she was experiencing occasional palpitations and admitted to episodes of chest pressure.   Oftentimes her chest pressure may last 10 to 15 minutes and resolve spontaneously.  During that evaluation, in light of her increasing symptomatology I recommended definitive repeat cardiac catheterization.  Catheterization was done on April 28, 2019 which revealed predominant single-vessel CAD with calcification of the proximal to mid LAD with 20% ostial diagonal stenosis, 30% stenosis of the LAD immediately after the diagonal vessel, and 60% smooth mildly calcified stenosis of the mid LAD and a slight bend in the vessel proximal to the second diagonal takeoff with mild 10% LAD stenosis beyond.  The LAD was large caliber and wrapped around the apex.  Her circumflex and dominant RCA were normal.  Following her catheterization I initiated amlodipine 2.5 mg at bedtime since she did not tolerate isosorbide initiation secondary to precipitation of migraine headaches.  Also adjusted beta-blocker therapy with metoprolol tartrate 25 mg twice a day, and increase rosuvastatin to 40 mg daily in attempt to induce plaque regression.  I strongly advised smoking cessation.  Subsequent to her catheterization she has felt improved.  She still works third shift.  She reduced her tobacco but still smokes 2 to 3 cigarettes/day.  At times she still experiences some mild chest discomfort.  Typically she gets home from work at 7:30 AM, sleeps, and goes to work at [employer].     I last evaluated her in a telemedicine visit in April 2021.  She was continuing to smoke cigarettes and had not  been able to quit.  She did experience episodes of heartburn.  She was working on the night shift.  She is status post Nissen fundoplication for significant reflux disease over 20 years ago.  She had noticed some blood pressure lability and at times her pulse rate was fast.  During that evaluation I recommended further titration of metoprolol to 37.5 mg twice a day both for improved rate control and potential anti-ischemic benefit.  Over the past 5 months she has felt well.  She will be starting to work on the dayshift in 2 weeks.  Her blood pressure was improved with her medication adjustments.  She had laboratory from her primary physician apparently in April and was told that her labs were relatively stable.  Unfortunately she has not quit tobacco but has reduced smoking.  She continues to be on amlodipine 2.5 mg, metoprolol tartrate 37.5 mg twice a day for blood pressure and her CAD.  She denies any recent anginal symptoms.  She is on Zetia and rosuvastatin 40 mg for hyperlipidemia.  She is on Trelegy Ellipta for her lung disease.  She was evaluated in a virtual visit by Barbaraann Barthel nurse practitioner in June and at that time she was told that she would be referred to the lung cancer screening program.  She has not heard any additional information and this had not been done.  She continues to take levothyroxine for hypothyroidism.  She presents for evaluation.  Past Medical History:  Diagnosis Date  . Anxiety   . 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 Chemotherapy-induced peripheral neuropathy (HCC)    FEET  . 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 CKD (chronic kidney disease), stage III   . COPD with emphysema [facility]) pulmologist--  dr Elsworth Soho   advanded  emphysema per CT   . History of chemotherapy    left breast ca-- 05-09-2015   to 04-30-2015  . History of gastroesophageal reflux (GERD)    no problems since Nissen fundoplication  . Hypothyroidism   . Rectovaginal fistula   . Skin changes related to chemotherapy    ARMS  . Supraclinoid carotid artery aneurysm, small neurologist-  dr willis/  dr Krista Blue   left internal - 2 mm;  aneurysm  vs.  infundibulum--  residual chronic migraine  . Wears dentures    upper  . Wears glasses     Past Surgical History:  Procedure Laterality Date  . APPENDECTOMY  1992  . BREAST RECONSTRUCTION WITH PLACEMENT OF TISSUE EXPANDER AND FLEX HD (ACELLULAR HYDRATED DERMIS) Bilateral 09/25/2014   Procedure: PLACEMENT OF BILATERAL TISSUE EXPANDER AND  ACELLULAR DERMIS FOR BREAST RECONSTRUCTION ;  Surgeon: [name], MD;  Location: [facility];  Service: Plastics;  Laterality: Bilateral;  . CHOLECYSTECTOMY  1999  . ESOPHAGOGASTRODUODENOSCOPY (EGD) WITH PROPOFOL  01/18/2013  . LAPAROSCOPIC NISSEN FUNDOPLICATION  [id number]  . LEFT HEART CATH AND CORONARY ANGIOGRAPHY N/A 04/28/2019   Procedure: LEFT HEART CATH AND CORONARY ANGIOGRAPHY;  Surgeon: Troy Sine, MD;  Location: [facility];  Service: Cardiovascular;  Laterality: N/A;  . LEFT HEART CATHETERIZATION WITH CORONARY ANGIOGRAM N/A 12/26/2012   Procedure: LEFT HEART CATHETERIZATION WITH CORONARY ANGIOGRAM;  Surgeon: Lorretta Harp, MD;  Location: [facility] CATH LAB;  Service: Cardiovascular;  Laterality: N/A;   Nonobstructive CAD/  40-50% ostial D1/  normal LVF, ef 60%  . LIPOSUCTION WITH LIPOFILLING Bilateral 12/18/2014   Procedure: LIPOFILLING TO BILATERAL CHEST;  Surgeon: [name], MD;  Location: [facility];  Service: Plastics;  Laterality: Bilateral;  . PORT-A-CATH REMOVAL Right 06/28/2015   Procedure: REMOVAL PORT-A-CATH;  Surgeon: Leighton Ruff, MD;  Location: [facility];  Service: General;  Laterality:  Right;  . PORTACATH PLACEMENT N/A 04/02/2014   Procedure: INSERTION PORT-A-CATH;  Surgeon: [name], MD;  Location: [place];  Service: General;  Laterality: N/A;  . REMOVAL OF BILATERAL TISSUE EXPANDERS WITH PLACEMENT OF BILATERAL BREAST IMPLANTS Bilateral 12/18/2014   Procedure: REMOVAL OF BILATERAL TISSUE EXPANDERS,PLACEMENT SILICONE IMPLANTS ;  Surgeon: [name], MD;  Location: [facility];  Service: Plastics;  Laterality: Bilateral;  . SIMPLE MASTECTOMY WITH AXILLARY  SENTINEL NODE BIOPSY Bilateral 04/02/2014   Procedure: LEFT TOTAL MASTECTOMY WITH LEFT AXILLARY SENTINEL NODE BIOPSY, RIGHT PROPHYLACTIC MASTETCTOMY;  Surgeon: [name], MD;  Location: [facility];  Service: General;  Laterality: Bilateral;  . TOTAL ABDOMINAL HYSTERECTOMY W/ BILATERAL SALPINGOOPHORECTOMY  1996  . TRANSTHORACIC ECHOCARDIOGRAM  02-04-2015   grade 1 diastolic dysfunction, ef 75-91%/  trivial TR  . VESICO-VAGINAL FISTULA REPAIR N/A 07/17/2015   Procedure: REPAIR OF RECTOVAGINAL FISTULA;  Surgeon: [name], MD;  Location: [facility];  Service: Gynecology;  Laterality: N/A;  . VIDEO BRONCHOSCOPY Bilateral 01/16/2013   Procedure: VIDEO BRONCHOSCOPY WITHOUT FLUORO;  Surgeon: [name], MD;  Location: [facility];  Service: Cardiopulmonary;  Laterality: Bilateral;    Current Medications: Outpatient Medications Prior to Visit  Medication Sig Dispense Refill  . acetaminophen (TYLENOL) 325 MG tablet Take 2 tablets (650 mg total) by mouth every 6 (six) hours as needed for mild pain or headache (fever >/= 101).    Marland Kitchen albuterol (PROVENTIL) (2.5 MG/3ML) 0.083% nebulizer solution Take 3 mLs (2.5 mg total) by nebulization every 6 (six) hours as needed for wheezing or shortness of breath. 75 mL 30  . ALPRAZolam (XANAX) 0.25 MG tablet Take 0.125 mg by mouth daily.    Marland Kitchen amLODipine (NORVASC) 5 MG tablet Take 0.5 tablets (2.5 mg total) by mouth daily. Before work 45 tablet 3  . aspirin EC 81 MG tablet Take  1 tablet (81 mg total) by mouth daily. (Patient taking differently: Take 81 mg by mouth daily. Chewable) 90 tablet 3  . Diclofenac Potassium,Migraine, 50 MG PACK Take 50 mg by mouth. As Needed for Migraine    . ezetimibe (ZETIA) 10 MG tablet Take 1 tablet (10 mg total) by mouth daily. 90 tablet 3  . Fluticasone-Umeclidin-Vilant (TRELEGY ELLIPTA) 100-62.5-25 MCG/INH AEPB Inhale 1 puff into the lungs daily. 60 each 6  . HYDROcodone-acetaminophen (NORCO/VICODIN) 5-325 MG tablet Take 1 tablet by mouth every 6 (six) hours as needed for moderate pain. 30 tablet 0  . icosapent Ethyl (VASCEPA) 1 g capsule Take 2 capsules (2 g total) by mouth 2 (two) times daily. 360 capsule 3  . levothyroxine (SYNTHROID, LEVOTHROID) 125 MCG tablet Take 125 mcg by mouth daily before breakfast.    . metoprolol tartrate (LOPRESSOR) 25 MG tablet Take 1.5 tablet (37.85m) by mouth in the morning and 1.5 tablets (37.571m in the evening. 135 tablet 3  . montelukast (SINGULAIR) 10 MG tablet Take 10 mg by mouth at bedtime.     . pantoprazole (PROTONIX) 40 MG tablet Take 1 tablet (40 mg total) by mouth daily. 90 tablet 3  . PROAIR HFA 108 (90 BASE) MCG/ACT inhaler Inhale 2 puffs into the lungs every 4 (four) hours as needed for wheezing or shortness of breath. Reported on 07/08/2015    . rosuvastatin (CRESTOR) 40 MG tablet Take 1 tablet (40 mg total) by mouth daily. 90 tablet 3  . vitamin C (VITAMIN C) 500 MG tablet Take 1 tablet (500 mg total) by mouth daily.    . Marland Kitcheninc sulfate 220 (50 Zn) MG capsule Take 1 capsule (220 mg total) by mouth daily.     No facility-administered medications prior to visit.     Allergies:   Prednisone   Social History   Socioeconomic History  . Marital status: Married    Spouse name: EdLudwig Clarks . Number of children: 4  . Years of education: GED  . Highest education level: Not on file  Occupational History  Employer: [employer]  Tobacco Use  . Smoking status: Current Every Day Smoker     Packs/day: 0.00    Years: 37.00    Pack years: 0.00    Types: Cigarettes, E-cigarettes    Last attempt to quit: 03/29/2014    Years since quitting: 6.0  . Smokeless tobacco: Never Used  . Tobacco comment: 5-7 cigs daily 06/07/17  Vaping Use  . Vaping Use: Never used  Substance and Sexual Activity  . Alcohol use: No  . Drug use: No  . Sexual activity: Not Currently  Other Topics Concern  . Not on file  Social History Narrative   Patient lives at home with her husband [name]) Patient works full time.   Education- GED   Right handed.   Caffeine- one cup of coffee daily.   Social Determinants of Health   Financial Resource Strain:   . Difficulty of Paying Living Expenses: Not on file  Food Insecurity:   . Worried About Charity fundraiser in the Last Year: Not on file  . Ran Out of Food in the Last Year: Not on file  Transportation Needs:   . Lack of Transportation (Medical): Not on file  . Lack of Transportation (Non-Medical): Not on file  Physical Activity:   . Days of Exercise per Week: Not on file  . Minutes of Exercise per Session: Not on file  Stress:   . Feeling of Stress : Not on file  Social Connections:   . Frequency of Communication with Friends and Family: Not on file  . Frequency of Social Gatherings with Friends and Family: Not on file  . Attends Religious Services: Not on file  . Active Member of Clubs or Organizations: Not on file  . Attends Archivist Meetings: Not on file  . Marital Status: Not on file     Family History:  The patient's family history includes Breast cancer (age of onset: 30) in her paternal aunt; Cancer in her paternal grandfather; Colon cancer (age of onset: 69) in her paternal grandmother; Colon cancer (age of onset: 82) in her father; Heart attack in her maternal grandmother; Heart failure in her mother; Ovarian cancer (age of onset: 54) in her sister; Ovarian cancer (age of onset: 81) in an other family member; Throat cancer  (age of onset: 66) in her brother; Throat cancer (age of onset: 55) in her brother.   ROS General: Negative; No fevers, chills, or night sweats;  HEENT: Negative; No changes in vision or hearing, sinus congestion, difficulty swallowing Pulmonary: Negative; No cough, wheezing, shortness of breath, hemoptysis Cardiovascular: See HPI GI: GERD GU: Negative; No dysuria, hematuria, or difficulty voiding Musculoskeletal: Fall resulting in rib fracture and fracture fibula Hematologic/Oncology: History of breast CA Endocrine: Negative; no heat/cold intolerance; no diabetes Neuro: Negative; no changes in balance, headaches Skin: Negative; No rashes or skin lesions Psychiatric: Negative; No behavioral problems, depression Sleep: Negative; No snoring, daytime sleepiness, hypersomnolence, bruxism, restless legs, hypnogognic hallucinations, no cataplexy Other comprehensive 14 point system review is negative.   PHYSICAL EXAM:   VS:  BP 124/80   Pulse 69   Ht '5\' 6"'  (1.676 m)   Wt 156 lb (70.8 kg)   SpO2 100%   BMI 25.18 kg/m     Repeat blood pressure by me was 116/76 supine and 112/76 standing  Wt Readings from Last 3 Encounters:  04/05/20 156 lb (70.8 kg)  11/10/19 160 lb (72.6 kg)  09/12/19 164 lb 6.4 oz (74.6  kg)    General: Alert, oriented, no distress.  Skin: normal turgor, no rashes, warm and dry HEENT: Normocephalic, atraumatic. Pupils equal round and reactive to light; sclera anicteric; extraocular muscles intact; Nose without nasal septal hypertrophy Mouth/Parynx benign; Mallinpatti scale 3 Neck: No JVD, no carotid bruits; normal carotid upstroke Lungs: clear to ausculatation and percussion; no wheezing or rales Chest wall: without tenderness to palpitation Heart: PMI not displaced, RRR, s1 s2 normal, 1/6 systolic murmur, no diastolic murmur, no rubs, gallops, thrills, or heaves Abdomen: soft, nontender; no hepatosplenomehaly, BS+; abdominal aorta nontender and not dilated by  palpation. Back: no CVA tenderness Pulses 2+ Musculoskeletal: full range of motion, normal strength, no joint deformities Extremities: no clubbing cyanosis or edema, Homan's sign negative  Neurologic: grossly nonfocal; Cranial nerves grossly wnl Psychologic: Normal mood and affect    Studies/Labs Reviewed:   ECG (independently read by me): NSR at 69, IRBBB, no ectopy  June 27, 2019 ECG (independently read by me): Normal sinus rhythm at 70 bpm.  No ectopy.  Normal intervals.  September 2020 ECG (independently read by me): Normal sinus rhythm at 75 beats per minute.  New mild RV conduction delay V1 V2 with T wave inversion and new T wave inversion in lead V3 and V4.  Normal intervals.  No ectopy  October 29, 2018 ECG (independently read by me): Normal sinus rhythm at 74 bpm.  No ectopy.  Normal intervals.  No ST segment changes.  Recent Labs: BMP Latest Ref Rng & Units 05/15/2019 04/25/2019 03/24/2019  Glucose 70 - 99 mg/dL 91 90 90  BUN 6 - 20 mg/dL '12 10 15  ' Creatinine 0.44 - 1.00 mg/dL 0.82 0.80 0.81  BUN/Creat Ratio 9 - 23 - 13 19  Sodium 135 - 145 mmol/L 142 141 143  Potassium 3.5 - 5.1 mmol/L 4.2 4.5 4.7  Chloride 98 - 111 mmol/L 108 105 106  CO2 22 - 32 mmol/L '23 22 20  ' Calcium 8.9 - 10.3 mg/dL 9.4 10.0 9.6     Hepatic Function Latest Ref Rng & Units 05/15/2019 03/24/2019 01/16/2019  Total Protein 6.5 - 8.1 g/dL 7.3 7.0 6.7  Albumin 3.5 - 5.0 g/dL 4.1 4.6 4.4  AST 15 - 41 U/L '16 20 15  ' ALT 0 - 44 U/L '16 16 16  ' Alk Phosphatase 38 - 126 U/L 139(H) 126(H) 131(H)  Total Bilirubin 0.3 - 1.2 mg/dL 0.4 0.5 0.3    CBC Latest Ref Rng & Units 05/15/2019 04/25/2019 01/16/2019  WBC 4.0 - 10.5 K/uL 4.9 7.3 4.7  Hemoglobin 12.0 - 15.0 g/dL 12.9 12.2 12.7  Hematocrit 36 - 46 % 37.7 35.8 37.6  Platelets 150 - 400 K/uL 241 249 244   Lab Results  Component Value Date   MCV 92.4 05/15/2019   MCV 93 04/25/2019   MCV 92 01/16/2019   Lab Results  Component Value Date   TSH 4.610 (H)  01/16/2019   Lab Results  Component Value Date   HGBA1C 5.0 12/26/2012     BNP No results found for: BNP  ProBNP    Component Value Date/Time   PROBNP 326.1 (H) 12/26/2012 0553     Lipid Panel     Component Value Date/Time   CHOL 132 03/24/2019 0842   TRIG 228 (H) 03/24/2019 0842   HDL 40 03/24/2019 0842   CHOLHDL 3.3 03/24/2019 0842   CHOLHDL 4.4 12/26/2012 0340   VLDL 17 12/26/2012 0340   LDLCALC 55 03/24/2019 0842     RADIOLOGY: No  results found.   Additional studies/ records that were reviewed today include:  ECHO 02/04/2015 Study Conclusions  - Left ventricle: The cavity size was normal. Systolic function was normal. The estimated ejection fraction was in the range of 50% to 55%. Wall motion was normal; there were no regional wall motion abnormalities. Doppler parameters are consistent with abnormal left ventricular relaxation (grade 1 diastolic dysfunction). There was no evidence of elevated ventricular filling pressure by Doppler parameters. - Aortic valve: Trileaflet; normal thickness leaflets. Transvalvular velocity was within the normal range. There was no stenosis. There was no regurgitation. - Aortic root: The aortic root was normal in size. - Mitral valve: Structurally normal valve. There was no regurgitation. - Left atrium: The atrium was normal in size. - Right ventricle: The cavity size was normal. Wall thickness was normal. Systolic function was normal. - Right atrium: The atrium was normal in size. - Tricuspid valve: There was trivial regurgitation. - Pulmonary arteries: Systolic pressure was within the normal range. - Inferior vena cava: The vessel was normal in size. - Pericardium, extracardiac: There was no pericardial effusion.   ECHO IMPRESSIONS 01/09/2019  1. The left ventricle has normal systolic function with an ejection fraction of 60-65%. The cavity size was normal. Left ventricular diastolic parameters  were normal.  2. The right ventricle has normal systolic function. The cavity was normal. There is no increase in right ventricular wall thickness.  3. Mild thickening of the mitral valve leaflet.  4. The aortic valve is tricuspid. Mild thickening of the aortic valve. Mild calcification of the aortic valve.   Cardiac CT IMPRESSION:02/02/2019  1. Coronary calcium score of 514. This was 99th percentile for age and sex matched control.  2. Normal coronary origin with right dominance.  3. Moderate calcified plaque in the proximal LAD with 50-69%% stenosis. CAD RAD 3.  4. Moderate calcified plaque in the proximal OM with 50-69% stenosis. CAD RAD 3  5. FFR has been sent to evaluate LAD and OM.  6. Recommend Aggressive Risk Factor Modification.  CT FFR ANALYSIS FINDINGS: FFRct analysis was performed on the original cardiac CT angiogram dataset. Diagrammatic representation of the FFRct analysis is provided in a separate PDF document in PACS. This dictation was created using the PDF document and an interactive 3D model of the results. 3D model is not available in the EMR/PACS. Normal FFR range is >0.80.  1. Left Main: No significant stenosis.  FFR LM = 0.99  2. LAD: No significant stenosis. Proximal FFR = 0.98, Mid FFR = 0.84, Distal FFR = 0.75. 3. LCX: No significant stenosis. Proximal FFR = 0.98, Mid FFR = 0.96, Distal FFR = 0.93. 4. RCA: No significant stenosis. Proximal FFR = 0.99, Mid FFR = 0.95, Distal FFR = 0.91. PDA not assessed.  IMPRESSION: 1. CT FFR analysis showed a borderline hemodynamically significant reduction in flow in the distal LAD. This represents either diffuse distal disease or distal tapering of the vessel.  2. Recommend medical management and aggressive risk factor Modification.   CARDIAC CATH: 04/28/2019  Prox LAD to Mid LAD lesion is 30% stenosed.  Mid LAD-1 lesion is 60% stenosed.  Mid LAD-2 lesion is 10% stenosed.  The left  ventricular systolic function is normal.  LV end diastolic pressure is low.  1st Diag lesion is 20% stenosed.   Single-vessel CAD with calcification of the proximal to mid LAD with 20% ostial diagonal stenosis with 30% stenosis of the LAD immediately after the diagonal vessel, 60% smooth mildly calcified  stenosis of the LAD in a slight bend in the vessel proximal to the second diagonal takeoff with mild smooth 10% LAD stenosis in the mid LAD.  The LAD is a large caliber and wraps around the LV apex.  Normal left circumflex and dominant RCA.  No LV function with EF estimate at least 55%.  LVEDP 9 mm.  RECOMMENDATION: Medical therapy trial.  The patient did not tolerate isosorbide initiation secondary to precipitation migraine headaches; will initiate amlodipine 2.5 mg at bedtime and adjust beta-blocker therapy with metoprolol tartrate to 25 mg twice a day.  Increase rosuvastatin to 40 mg daily in attempt to induce plaque regression.  Smoking cessation is imperative.      ASSESSMENT:    1. Coronary artery disease due to lipid rich plaque   2. Essential hypertension   3. Palpitations   4. Tobacco abuse   5. Hyperlipidemia with target LDL less than 70   6. History of breast cancer   7. Hypothyroidism, unspecified type   8. Covid-19 Virus Infection: April 2020     PLAN:   1.  Chest pain/CAD: Coronary calcium score was elevated at 514 and with documentation of plaque and recurrent symptomatology despite the addition of beta-blocker and nitrates.  Cardiac catheterization showed coronary calcification of the LAD with the tightest stenosis of 60% in the mid vessel.  She had a normal circumflex and RCA.  Presently she feels well and is not having anginal symptoms.  This has improved on her increased metoprolol regimen now at 37.5 mg twice a day and she continues to be on low-dose amlodipine at 2.5 mg daily.  2.  Essential hypertension: Blood pressure today is stable on amlodipine in  addition to metoprolol tartrate.  3.  Palpitations: Improved with beta-blocker titration resting pulse today is 69.  4.  Hyperlipidemia: Previously documented to be mixed hyperlipidemia with elevation of triglycerides, VLDL.  She is now on rosuvastatin 40 mg and Zetia 10 mg.  In September 2020 LDL cholesterol was 55 but triglycerides were elevated at 228.  I am recommending follow-up chemistry profile lipid studies as well as TSH.  5.  Hypothyroidism: She continues to be on levothyroxine at 125 mcg daily.  TSH in June 2020 was 4.6.  This will be repeated.  6.  Tobacco abuse: I again discussed the importance of complete smoking cessation.  Her husband had previously been a smoker but quit tobacco when he developed coronary disease.  I encouraged her to quit as well.  I will refer her to the lung cancer screening program for CT imaging.  7.  History of COVID-19 requiring 1 week hospitalization: Her symptoms began on April 1.  She states her peak temperature was 104.  Her husband had developed COVID several days prior to her and he had been hospitalized for 13 days.  She received a 5-day course of hydroxychloroquine.  COVID test 2 weeks after discharge was still positive and ultimately in mid-May COVID test was negative.  She had been demonstrated to have a strong antibody response.  Repeat Covid testing prior to her catheterization was negative.  8.  History of breast CA: Diagnosed in 2015, followed by Dr. Jana Hakim.  No recurrence and currently stable    Medication Adjustments/Labs and Tests Ordered: Current medicines are reviewed at length with the patient today.  Concerns regarding medicines are outlined above.  Medication changes, Labs and Tests ordered today are listed in the Patient Instructions below. Patient Instructions  Medication Instructions:  CONTINUE WITH  CURRENT MEDICATIONS. NO CHANGES.  *If you need a refill on your cardiac medications before your next appointment, please call your  pharmacy*   Lab Work: FASTING LABS: CMET LIPID TSH  If you have labs (blood work) drawn today and your tests are completely normal, you will receive your results only by: Marland Kitchen MyChart Message (if you have MyChart) OR . A paper copy in the mail If you have any lab test that is abnormal or we need to change your treatment, we will call you to review the results.    Follow-Up: At [facility], you and your health needs are our priority.  As part of our continuing mission to provide you with exceptional heart care, we have created designated Provider Care Teams.  These Care Teams include your primary Cardiologist (physician) and Advanced Practice Providers (APPs -  Physician Assistants and Nurse Practitioners) who all work together to provide you with the care you need, when you need it.  We recommend signing up for the patient portal called "MyChart".  Sign up information is provided on this After Visit Summary.  MyChart is used to connect with patients for Virtual Visits (Telemedicine).  Patients are able to view lab/test results, encounter notes, upcoming appointments, etc.  Non-urgent messages can be sent to your provider as well.   To learn more about what you can do with MyChart, go to [email or web address].    Your next appointment:   6 month(s)  The format for your next appointment:   In Person  Provider:   Shelva Majestic, MD   Other Instructions AMBULATORY REFERRAL TO [place]     Signed, Shelva Majestic, MD  04/07/2020 4:58 PM    [place] [address], [place], [place], [place]  [address] Phone: [phone]

## 2020-04-07 ENCOUNTER — Encounter: Payer: Self-pay | Admitting: Cardiovascular Disease

## 2020-04-08 ENCOUNTER — Other Ambulatory Visit: Payer: Self-pay | Admitting: *Deleted

## 2020-04-08 DIAGNOSIS — I2583 Coronary atherosclerosis due to lipid rich plaque: Secondary | ICD-10-CM | POA: Diagnosis not present

## 2020-04-08 DIAGNOSIS — R0789 Other chest pain: Secondary | ICD-10-CM | POA: Diagnosis not present

## 2020-04-08 DIAGNOSIS — I2510 Atherosclerotic heart disease of native coronary artery without angina pectoris: Secondary | ICD-10-CM | POA: Diagnosis not present

## 2020-04-08 DIAGNOSIS — R002 Palpitations: Secondary | ICD-10-CM | POA: Diagnosis not present

## 2020-04-08 DIAGNOSIS — F17210 Nicotine dependence, cigarettes, uncomplicated: Secondary | ICD-10-CM

## 2020-04-08 LAB — LIPID PANEL
Chol/HDL Ratio: 3.3 ratio (ref 0.0–4.4)
Cholesterol, Total: 144 mg/dL (ref 100–199)
HDL: 43 mg/dL (ref >39)
LDL Chol Calc (NIH): 67 mg/dL (ref 0–99)
Triglycerides: 206 mg/dL — ABNORMAL HIGH (ref 0–149)
VLDL Cholesterol Cal: 34 mg/dL (ref 5–40)

## 2020-04-08 LAB — COMPREHENSIVE METABOLIC PANEL
ALT: 16 IU/L (ref 0–32)
AST: 16 IU/L (ref 0–40)
Albumin/Globulin Ratio: 2 (ref 1.2–2.2)
Albumin: 4.7 g/dL (ref 3.8–4.9)
Alkaline Phosphatase: 120 IU/L (ref 44–121)
BUN/Creatinine Ratio: 10 (ref 9–23)
BUN: 8 mg/dL (ref 6–24)
Bilirubin Total: 0.5 mg/dL (ref 0.0–1.2)
CO2: 21 mmol/L (ref 20–29)
Calcium: 10 mg/dL (ref 8.7–10.2)
Chloride: 108 mmol/L — ABNORMAL HIGH (ref 96–106)
Creatinine, Ser: 0.81 mg/dL (ref 0.57–1.00)
GFR calc Af Amer: 93 mL/min/{1.73_m2} (ref >59)
GFR calc non Af Amer: 80 mL/min/{1.73_m2} (ref >59)
Globulin, Total: 2.3 g/dL (ref 1.5–4.5)
Glucose: 88 mg/dL (ref 65–99)
Potassium: 4.9 mmol/L (ref 3.5–5.2)
Sodium: 141 mmol/L (ref 134–144)
Total Protein: 7 g/dL (ref 6.0–8.5)

## 2020-04-08 LAB — TSH: TSH: 0.052 u[IU]/mL — ABNORMAL LOW (ref 0.450–4.500)

## 2020-04-08 MED ORDER — BREZTRI AEROSPHERE 160-9-4.8 MCG/ACT IN AERO: 2.0000 | INHALATION_SPRAY | Freq: Two times a day (BID) | RESPIRATORY_TRACT | 0 refills | Status: DC

## 2020-04-11 ENCOUNTER — Telehealth: Payer: Self-pay | Admitting: Cardiovascular Disease

## 2020-04-11 MED ORDER — OMEGA-3-ACID ETHYL ESTERS 1 G PO CAPS: 1.0000 g | ORAL_CAPSULE | Freq: Two times a day (BID) | ORAL | 11 refills | Status: DC

## 2020-04-11 MED ORDER — LEVOTHYROXINE SODIUM 100 MCG PO TABS: 100.0000 ug | ORAL_TABLET | Freq: Every day | ORAL | 1 refills | Status: DC

## 2020-04-11 NOTE — Telephone Encounter (Signed)
Troy Sine, MD  04/09/2020 7:46 PM EDT     Renal function normal; triglycerides elevated. LDL 67. Consider omega-3 fatty acid with Lovaza 2 capsules daily. TSH is over suppressed. Recommend reduction in levothyroxine dose currently at 125 mcg. Consider decreasing to 100 mcg and follow-up with primary MD   Patient called w/results. Med changes made. Routed to PCP

## 2020-04-11 NOTE — Telephone Encounter (Signed)
Patient returning a call from our office about lab results. 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 Please call back

## 2020-04-15 ENCOUNTER — Telehealth: Payer: Self-pay

## 2020-04-15 NOTE — Telephone Encounter (Signed)
**Note De-Identified Geovanna Simko Obfuscation** Following message received from covermymeds:  Katrine Coho Key: [id number] - PA Case ID: [id number]  Outcome: Approved today  Case [id number] Status:Approved;Review Type:Prior Auth;Coverage  Start Date:03/16/2020;Coverage End Date:04/15/2023;  Drug Omega-3-acid Ethyl Esters 1GM capsules  Form [employer] Electronic PA Form (2017 NCPDP)  Original Claim Info75 CALL HELP DESK  I have made [facility] aware of this approval.

## 2020-04-15 NOTE — Telephone Encounter (Signed)
**Note De-Identified Keidan Aumiller Obfuscation** I started a Omega-3 Ethyl Esters PA through covermymeds: Key: [id number]

## 2020-04-15 NOTE — Telephone Encounter (Addendum)
Prior authorization for Omega-3-acid Ethyl Esters 1 gm recieved via [email or web address].  Forwarded to [facility] department for processing.    CMM Key: [id number]

## 2020-04-22 ENCOUNTER — Encounter: Payer: Self-pay | Admitting: Acute Care

## 2020-04-22 ENCOUNTER — Other Ambulatory Visit: Payer: Self-pay

## 2020-04-22 ENCOUNTER — Ambulatory Visit
Admission: RE | Admit: 2020-04-22 | Discharge: 2020-04-22 | Disposition: A | Discharge status: Home / Self Care | Payer: BC Managed Care – PPO | Source: Ambulatory Visit | Attending: Acute Care | Admitting: Acute Care

## 2020-04-22 ENCOUNTER — Ambulatory Visit (INDEPENDENT_AMBULATORY_CARE_PROVIDER_SITE_OTHER): Payer: BC Managed Care – PPO | Admitting: Acute Care

## 2020-04-22 VITALS — BP 108/68 | HR 82 | Temp 98.2°F | Ht 66.5 in | Wt 150.4 lb

## 2020-04-22 DIAGNOSIS — F17210 Nicotine dependence, cigarettes, uncomplicated: Secondary | ICD-10-CM

## 2020-04-22 DIAGNOSIS — I700 Atherosclerosis of aorta: Secondary | ICD-10-CM | POA: Diagnosis not present

## 2020-04-22 DIAGNOSIS — Z122 Encounter for screening for malignant neoplasm of respiratory organs: Secondary | ICD-10-CM

## 2020-04-22 DIAGNOSIS — J439 Emphysema, unspecified: Secondary | ICD-10-CM | POA: Diagnosis not present

## 2020-04-22 DIAGNOSIS — I2510 Atherosclerotic heart disease of native coronary artery without angina pectoris: Secondary | ICD-10-CM | POA: Diagnosis not present

## 2020-04-22 NOTE — Progress Notes (Signed)
Shared Decision Making Visit Lung Cancer Screening Program [phone])   Eligibility:  Age 59 y.o.  Pack Years Smoking History Calculation 30 pack year smoking history (# packs/per year x # years smoked)  Recent History of coughing up blood  no  Unexplained weight loss? no ( >Than 15 pounds within the last 6 months )  Prior History Lung / other cancer no (Diagnosis within the last 5 years already requiring surveillance chest CT Scans).  Smoking Status Current Smoker  Former Smokers: Years since quit: NA  Quit Date: NA  Visit Components:  Discussion included one or more decision making aids. yes  Discussion included risk/benefits of screening. yes  Discussion included potential follow up diagnostic testing for abnormal scans. yes  Discussion included meaning and risk of over diagnosis. yes  Discussion included meaning and risk of False Positives. yes  Discussion included meaning of total radiation exposure. yes  Counseling Included:  Importance of adherence to annual lung cancer LDCT screening. yes  Impact of comorbidities on ability to participate in the program. yes  Ability and willingness to under diagnostic treatment. yes  Smoking Cessation Counseling:  Current Smokers:   Discussed importance of smoking cessation. yes  Information about tobacco cessation classes and interventions provided to patient. yes  Patient provided with "ticket" for LDCT Scan. yes  Symptomatic Patient. no  Counseling  Diagnosis Code: Tobacco Use Z72.0  Asymptomatic Patient yes  Counseling (Intermediate counseling: > three minutes counseling) X9147  Former Smokers:   Discussed the importance of maintaining cigarette abstinence. yes  Diagnosis Code: Personal History of Nicotine Dependence. W29.562  Information about tobacco cessation classes and interventions provided to patient. Yes  Patient provided with "ticket" for LDCT Scan. yes  Written Order for Lung Cancer  Screening with LDCT placed in Epic. Yes (CT Chest Lung Cancer Screening Low Dose W/O CM) [id number] Z12.2-Screening of respiratory organs Z87.891-Personal history of nicotine dependence  BP 108/68   Pulse 82   Temp 98.2 F (36.8 C) (Oral)   Ht 5' 6.5" (1.689 m)   Wt 150 lb 6.4 oz (68.2 kg)   SpO2 96%   BMI 23.91 kg/m    I have spent 25 minutes of face to face time with  discussing the risks and benefits of lung cancer screening. We viewed a power point together that explained in detail the above noted topics. We paused at intervals to allow for questions to be asked and answered to ensure understanding.We discussed that the single most powerful action that she can take to decrease her risk of developing lung cancer is to quit smoking. We discussed whether or not she is ready to commit to setting a quit date. We discussed options for tools to aid in quitting smoking including nicotine replacement therapy, non-nicotine medications, support groups, Quit Smart classes, and behavior modification. We discussed that often times setting smaller, more achievable goals, such as eliminating 1 cigarette a day for a week and then 2 cigarettes a day for a week can be helpful in slowly decreasing the number of cigarettes smoked. This allows for a sense of accomplishment as well as providing a clinical benefit. I gave her the " Be Stronger Than Your Excuses" card with contact information for community resources, classes, free nicotine replacement therapy, and access to mobile apps, text messaging, and on-line smoking cessation help. I have also given her my card and contact information in the event she needs to contact me. We discussed the time and location of the scan, and that  either Doroteo Glassman RN or I will call with the results within 24-48 hours of receiving them. I have offered her  a copy of the power point we viewed  as a resource in the event they need reinforcement of the concepts we discussed today in the  office. The patient verbalized understanding of all of  the above and had no further questions upon leaving the office. They have my contact information in the event they have any further questions.  I spent 4 minutes counseling on smoking cessation and the health risks of continued tobacco abuse.  I explained to the patient that there has been a high incidence of coronary artery disease noted on these exams. I explained that this is a non-gated exam therefore degree or severity cannot be determined. This patient is currently on statin therapy. I have asked the patient to follow-up with their PCP regarding any incidental finding of coronary artery disease and management with diet or medication as their PCP  feels is clinically indicated. The patient verbalized understanding of the above and had no further questions upon completion of the visit.      Stacy Spatz, NP 04/22/2020 4:13 PM

## 2020-04-22 NOTE — Patient Instructions (Signed)
Thank you for participating in the [facility]. It was our pleasure to meet you today. We will call you with the results of your scan within the next few days. Your scan will be assigned a Lung RADS category score by the physicians reading the scans.  This Lung RADS score determines follow up scanning.  See below for description of categories, and follow up screening recommendations. We will be in touch to schedule your follow up screening annually or based on recommendations of our providers. We will fax a copy of your scan results to your Primary Care Physician, or the physician who referred you to the program, to ensure they have the results. Please call the office if you have any questions or concerns regarding your scanning experience or results.  Our office number is [phone]. Please speak with Denise Phelps, RN. She is our Lung Cancer Screening RN. If she is unavailable when you call, please have the office staff send her a message. She will return your call at her earliest convenience. Remember, if your scan is normal, we will scan you annually as long as you continue to meet the criteria for the program. (Age 55-77, Current smoker or smoker who has quit within the last 15 years). If you are a smoker, remember, quitting is the single most powerful action that you can take to decrease your risk of lung cancer and other pulmonary, breathing related problems. We know quitting is hard, and we are here to help.  Please let us know if there is anything we can do to help you meet your goal of quitting. If you are a former smoker, congratulations. We are proud of you! Remain smoke free! Remember you can refer friends or family members through the number above.  We will screen them to make sure they meet criteria for the program. Thank you for helping us take better care of you by participating in Lung Screening.  Lung RADS Categories:  Lung RADS 1: no nodules  or definitely non-concerning nodules.  Recommendation is for a repeat annual scan in 12 months.  Lung RADS 2:  nodules that are non-concerning in appearance and behavior with a very low likelihood of becoming an active cancer. Recommendation is for a repeat annual scan in 12 months.  Lung RADS 3: nodules that are probably non-concerning , includes nodules with a low likelihood of becoming an active cancer.  Recommendation is for a 6-month repeat screening scan. Often noted after an upper respiratory illness. We will be in touch to make sure you have no questions, and to schedule your 6-month scan.  Lung RADS 4 A: nodules with concerning findings, recommendation is most often for a follow up scan in 3 months or additional testing based on our provider's assessment of the scan. We will be in touch to make sure you have no questions and to schedule the recommended 3 month follow up scan.  Lung RADS 4 B:  indicates findings that are concerning. We will be in touch with you to schedule additional diagnostic testing based on our provider's  assessment of the scan.   

## 2020-04-29 ENCOUNTER — Telehealth: Payer: Self-pay | Admitting: Cardiovascular Disease

## 2020-04-29 MED ORDER — SYNTHROID 100 MCG PO TABS: 100.0000 ug | ORAL_TABLET | Freq: Every day | ORAL | 1 refills | Status: DC

## 2020-04-29 NOTE — Telephone Encounter (Signed)
[facility] as requested, patient aware

## 2020-04-29 NOTE — Telephone Encounter (Signed)
° ° °  Pt c/o medication issue:  1. Name of Medication:   levothyroxine (SYNTHROID) 100 MCG tablet    2. How are you currently taking this medication (dosage and times per day)? Take 1 tablet (100 mcg total) by mouth daily before breakfast.  3. Are you having a reaction (difficulty breathing--STAT)?   4. What is your medication issue? Pt said her insurance change the copay for this medication since Dr. Claiborne Billings wrote on the prescription generic or name brand. Pt said she only needs name brand for this medication and she been having a hard time getting it for a week now. She wanted to send a new prescription stating only name brand for this medication.

## 2020-04-30 ENCOUNTER — Other Ambulatory Visit: Payer: Self-pay | Admitting: *Deleted

## 2020-04-30 DIAGNOSIS — F17210 Nicotine dependence, cigarettes, uncomplicated: Secondary | ICD-10-CM

## 2020-04-30 NOTE — Progress Notes (Signed)
Please call patient and let them  know their  low dose Ct was read as a Lung RADS 2: nodules that are benign in appearance and behavior with a very low likelihood of becoming a clinically active cancer due to size or lack of growth. Recommendation per radiology is for a repeat LDCT in 12 months. .Please let them  know we will order and schedule their  annual screening scan for 04/2021. Please let them  know there was notation of CAD on their  scan.  Please remind the patient  that this is a non-gated exam therefore degree or severity of disease  cannot be determined. Please have them  follow up with their PCP regarding potential risk factor modification, dietary therapy or pharmacologic therapy if clinically indicated. Pt.  is  currently on statin therapy. Please place order for annual  screening scan for  04/2021 and fax results to PCP. Thanks so much. 

## 2020-05-20 ENCOUNTER — Other Ambulatory Visit: Payer: Self-pay | Admitting: Cardiovascular Disease

## 2020-07-01 ENCOUNTER — Ambulatory Visit: Payer: BC Managed Care – PPO | Admitting: Cardiovascular Disease

## 2020-09-09 ENCOUNTER — Other Ambulatory Visit: Payer: Self-pay | Admitting: Pulmonary Disease

## 2020-09-10 ENCOUNTER — Telehealth: Payer: Self-pay | Admitting: Pulmonary Disease

## 2020-09-10 NOTE — Telephone Encounter (Signed)
OK to send breztri 2 puffs twice daily x 3 refills

## 2020-09-10 NOTE — Telephone Encounter (Signed)
RA the pt stated that the trelegy was supposed to be switched over the breztri but this never happened.  I do not see where this was discussed in your last OV note.  Please advise. thanks

## 2020-09-11 MED ORDER — BREZTRI AEROSPHERE 160-9-4.8 MCG/ACT IN AERO: 2.0000 | INHALATION_SPRAY | Freq: Two times a day (BID) | RESPIRATORY_TRACT | 3 refills | Status: DC

## 2020-09-11 NOTE — Telephone Encounter (Signed)
Call returned to patient, confirmed DOB. Confirmed medication and pharmacy. Made aware inhaler has been sent to pharmacy. Voiced understanding.   Nothing further needed at this time.

## 2020-10-09 ENCOUNTER — Encounter: Payer: Self-pay | Admitting: Cardiovascular Disease

## 2020-10-09 ENCOUNTER — Ambulatory Visit (INDEPENDENT_AMBULATORY_CARE_PROVIDER_SITE_OTHER): Payer: BC Managed Care – PPO | Admitting: Cardiovascular Disease

## 2020-10-09 ENCOUNTER — Other Ambulatory Visit: Payer: Self-pay

## 2020-10-09 VITALS — BP 104/66 | HR 72 | Ht 66.5 in | Wt 155.0 lb

## 2020-10-09 DIAGNOSIS — E039 Hypothyroidism, unspecified: Secondary | ICD-10-CM

## 2020-10-09 DIAGNOSIS — Z79899 Other long term (current) drug therapy: Secondary | ICD-10-CM | POA: Diagnosis not present

## 2020-10-09 DIAGNOSIS — I2510 Atherosclerotic heart disease of native coronary artery without angina pectoris: Secondary | ICD-10-CM | POA: Diagnosis not present

## 2020-10-09 DIAGNOSIS — Z20822 Contact with and (suspected) exposure to covid-19: Secondary | ICD-10-CM

## 2020-10-09 DIAGNOSIS — E785 Hyperlipidemia, unspecified: Secondary | ICD-10-CM

## 2020-10-09 DIAGNOSIS — E038 Other specified hypothyroidism: Secondary | ICD-10-CM

## 2020-10-09 DIAGNOSIS — Z853 Personal history of malignant neoplasm of breast: Secondary | ICD-10-CM

## 2020-10-09 DIAGNOSIS — I10 Essential (primary) hypertension: Secondary | ICD-10-CM | POA: Diagnosis not present

## 2020-10-09 DIAGNOSIS — Z720 Tobacco use: Secondary | ICD-10-CM

## 2020-10-09 NOTE — Patient Instructions (Signed)
Medication Instructions:  Continue current medications  *If you need a refill on your cardiac medications before your next appointment, please call your pharmacy*   Lab Work: CBC, CMP, TSH and Fasting Lipid  If you have labs (blood work) drawn today and your tests are completely normal, you will receive your results only by: Marland Kitchen MyChart Message (if you have MyChart) OR . A paper copy in the mail If you have any lab test that is abnormal or we need to change your treatment, we will call you to review the results.   Testing/Procedures: None Ordered   Follow-Up: At [facility], you and your health needs are our priority.  As part of our continuing mission to provide you with exceptional heart care, we have created designated Provider Care Teams.  These Care Teams include your primary Cardiologist (physician) and Advanced Practice Providers (APPs -  Physician Assistants and Nurse Practitioners) who all work together to provide you with the care you need, when you need it.  We recommend signing up for the patient portal called "MyChart".  Sign up information is provided on this After Visit Summary.  MyChart is used to connect with patients for Virtual Visits (Telemedicine).  Patients are able to view lab/test results, encounter notes, upcoming appointments, etc.  Non-urgent messages can be sent to your provider as well.   To learn more about what you can do with MyChart, go to [email or web address].    Your next appointment:   6 month(s)  The format for your next appointment:   In Person  Provider:   You may see Shelva Majestic, MD or one of the following Advanced Practice Providers on your designated Care Team:    Almyra Deforest, PA-C  Fabian Sharp, PA-C or   Roby Lofts, [place]

## 2020-10-09 NOTE — Progress Notes (Signed)
Thanks for your help, and exciting plans for the weekend   Cardiology Office Note    Date:  10/11/2020   ID:  Stacy Fernandez, DOB [birth year], MRN [id number]  PCP:  Jettie Booze, NP  Cardiologist:  Shelva Majestic, MD    History of Present Illness:  Stacy Fernandez is a 60 y.o. female  who presents to the office for 5 month cardiology evaluation in follow-up of her cardiac catheterization.  I initially saw Stacy Fernandez in 2014 for evaluation of chest pain.  She has a history of tobacco use, migraine headaches as well as COPD/emphysema.  In October 2013 a nuclear perfusion study showed normal perfusion and function.  She also had a history of GERD with reflux and had undergone esophageal manometry.  She developed chest pain leading to a [facility] long hospital presentation.  She underwent diagnostic cardiac catheterization by Dr. Gwenlyn Found on December 25, 2012 which showed 40% ostial first diagonal branch stenosis of the LAD; all other vessels were normal.  EF was 60%.She had continued to smoke cigarettes and I saw her in follow-up of her hospitalization in September 2014.  She has a history of a history of breast CA and is followed by Dr. Jana Fernandez.  Apparently earlier this year both she and her husband develop symptoms of COVID-19.  They believe that this may have been acquired in their church since 19 members tested positive and to subsequently died.  Her husband developed COVID initially and was hospitalized at [facility] long for 3 weeks.  She was admitted to [facility] due to her COVID in early April and was hospitalized for 1 week.  The day of discharge is when the [place] open for COVID patients.  Subsequent testing 2 weeks later still revealed her to be COVID positive and it was not until a May test that she tested COVID negative.  Subsequent antibody testing revealed that she had a strong positive antibody response.  Due to a 2 to 4-week period of increasing palpitations as well as some vague  discomfort in her chest described as a tightness she was referred for a telemedicine evaluation with me on January 05, 2019.  During that evaluation she states that her levothyroxine dose had been reduced from 175 down to 125 mcg.  I recommended that she wear an event monitor for 2 weeks.  I initiated therapy with metoprolol tartrate initially at 12.5 mg twice a day.  Since I did not know her blood pressure I did not initiate at a higher dose.  At the time she was drinking 1 to 2 cups of coffee per day and discontinuance of all caffeine was recommended.  In light of her previous catheterization in 2014 which showed mild nonobstructive disease with her longstanding tobacco history I recommended she undergo CT coronary angiogram to further evaluate her chest tightness and assess for progressive CAD.  I also scheduled her for 2D echo Doppler study and follow-up chest x-ray.  We discussed the importance of complete smoking cessation.   Her 2D echo Doppler study was done on January 09, 2019 which showed an EF of 60 to 65%.  He had normal diastolic parameters.  There was mild aortic sclerosis and mild thickening of mitral valve leaflets.  Both atrium were normal in size.  A 2-week monitor from June 29 through January 29, 2019 was essentially unremarkable.  She was predominantly in sinus rhythm July 6 at 4:47 AM with a rate 56 and a maximum heart rate  on January 28, 2019 at 7:49 PM at 126 bpm which was sinus tachycardia.  She underwent CT coronary angiography.  Calcium score was increased to 514 which was 99th percentile for age and sex matched control.  There is moderate calcified plaque in the proximal LAD with 50 to 69% stenosis as well as moderate calcified plaque in the proximal OM with 50 to 69% stenosis.  FFR was sent but results are still pending.  She has had issues with somewhat low blood pressure.  Laboratory from January 16, 2019 showed increased total cholesterol at 202, triglycerides 228, HDL 39, LDL 117.  Rosuvastatin  10 mg was initiated and she has been on therapy for approximately 3 weeks and tolerating this well.    Since I last saw her, her FFR analysis was essentially negative with mean at 0.99, proximal LAD 0.98, mid LAD 0.84, however the distal LAD was 0.75.  The circumflex and RCA FFR's were all normal at 0.9 and above.  At that time, continued medical therapy was recommended.  When I saw her on April 17, 2019 she was experiencing occasional palpitations and admitted to episodes of chest pressure.   Oftentimes her chest pressure may last 10 to 15 minutes and resolve spontaneously.  During that evaluation, in light of her increasing symptomatology I recommended definitive repeat cardiac catheterization.  Catheterization was done on April 28, 2019 which revealed predominant single-vessel CAD with calcification of the proximal to mid LAD with 20% ostial diagonal stenosis, 30% stenosis of the LAD immediately after the diagonal vessel, and 60% smooth mildly calcified stenosis of the mid LAD and a slight bend in the vessel proximal to the second diagonal takeoff with mild 10% LAD stenosis beyond.  The LAD was large caliber and wrapped around the apex.  Her circumflex and dominant RCA were normal.  Following her catheterization I initiated amlodipine 2.5 mg at bedtime since she did not tolerate isosorbide initiation secondary to precipitation of migraine headaches.  Also adjusted beta-blocker therapy with metoprolol tartrate 25 mg twice a day, and increase rosuvastatin to 40 mg daily in attempt to induce plaque regression.  I strongly advised smoking cessation.  Subsequent to her catheterization she has felt improved.  She still works third shift.  She reduced her tobacco but still smokes 2 to 3 cigarettes/day.  At times she still experiences some mild chest discomfort.  Typically she gets home from work at 7:30 AM, sleeps, and goes to work at [employer].     I evaluated her in a telemedicine visit in April 2021.  She  was continuing to smoke cigarettes and had not been able to quit.  She did experience episodes of heartburn.  She was working on the night shift.  She is status post Nissen fundoplication for significant reflux disease over 20 years ago.  She had noticed some blood pressure lability and at times her pulse rate was fast.  During that evaluation I recommended further titration of metoprolol to 37.5 mg twice a day both for improved rate control and potential anti-ischemic benefit.  I last saw her on April 05, 2020.  Over the previous 5 when she had felt well.  She will be switching to working the day shift instead of the night shift.  Her blood pressure had improved with medication adjustments.  She had undergone laboratory for primary doctor and was told that her labs were stable.  Unfortunately she continues to smoke cigarettes but has reduced her amount. She continues to be on  amlodipine 2.5 mg, metoprolol tartrate 37.5 mg twice a day for blood pressure and her CAD.  She denies any recent anginal symptoms.  She is on Zetia and rosuvastatin 40 mg for hyperlipidemia.  She is on Trelegy Ellipta for her lung disease.  She was evaluated in a virtual visit by Barbaraann Barthel nurse practitioner in June and at that time she was told that she would be referred to the lung cancer screening program.    She continued to take levothyroxine for hypothyroidism.    Since her last evaluation, she is sleeping much better now that she is working the day shift.  She has not had follow-up laboratory.  She sees Bradly Bienenstock, NP at [facility].  Unfortunately she is still smoking.  She denies chest pain or palpitations.  She presents for reevaluation.  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)--  s/p  bilateral mastectomy w/ reconstruction and chemotherapy  (05-08-2014 to 04-30-2015)   Chemotherapy-induced peripheral neuropathy (HCC)    FEET   Chronic migraine    caused by brain aneurysm--  BOTOX TX'S  by dr Krista Blue   CKD (chronic kidney disease), stage III ([place])    COPD with emphysema ([place]) pulmologist--  dr Elsworth Soho   advanded emphysema per CT    History of chemotherapy    left breast ca-- 05-09-2015   to 04-30-2015   History of gastroesophageal reflux (GERD)    no problems since Nissen fundoplication   Hypothyroidism    Rectovaginal fistula    Skin changes related to chemotherapy    ARMS   Supraclinoid carotid artery aneurysm, small neurologist-  dr willis/  dr Krista Blue   left internal - 2 mm;  aneurysm  vs.  infundibulum--  residual chronic migraine   Wears dentures    upper   Wears glasses     Past Surgical History:  Procedure Laterality Date   APPENDECTOMY  1992   BREAST RECONSTRUCTION WITH PLACEMENT OF TISSUE EXPANDER AND FLEX HD (ACELLULAR HYDRATED DERMIS) Bilateral 09/25/2014   Procedure: PLACEMENT OF BILATERAL TISSUE EXPANDER AND  ACELLULAR DERMIS FOR BREAST RECONSTRUCTION ;  Surgeon: [name], MD;  Location: [place];  Service: Plastics;  Laterality: Bilateral;   CHOLECYSTECTOMY  1999   ESOPHAGOGASTRODUODENOSCOPY (EGD) WITH PROPOFOL  01/18/2013   LAPAROSCOPIC NISSEN FUNDOPLICATION  [id number]   LEFT HEART CATH AND CORONARY ANGIOGRAPHY N/A 04/28/2019   Procedure: LEFT HEART CATH AND CORONARY ANGIOGRAPHY;  Surgeon: Troy Sine, MD;  Location: [place] CV LAB;  Service: Cardiovascular;  Laterality: N/A;   LEFT HEART CATHETERIZATION WITH CORONARY ANGIOGRAM N/A 12/26/2012   Procedure: LEFT HEART CATHETERIZATION WITH CORONARY ANGIOGRAM;  Surgeon: Lorretta Harp, MD;  Location: [facility] CATH LAB;  Service: Cardiovascular;  Laterality: N/A;   Nonobstructive CAD/  40-50% ostial D1/  normal LVF, ef 60%   LIPOSUCTION WITH LIPOFILLING Bilateral 12/18/2014   Procedure: LIPOFILLING TO BILATERAL CHEST;   Surgeon: [name], MD;  Location: [place];  Service: Plastics;  Laterality: Bilateral;   PORT-A-CATH REMOVAL Right 06/28/2015   Procedure: REMOVAL PORT-A-CATH;  Surgeon: Leighton Ruff, MD;  Location: [facility];  Service: General;  Laterality: Right;   PORTACATH PLACEMENT N/A 04/02/2014   Procedure: INSERTION PORT-A-CATH;  Surgeon: [name], MD;  Location: [place];  Service: General;  Laterality: N/A;   REMOVAL OF BILATERAL TISSUE EXPANDERS WITH PLACEMENT OF BILATERAL BREAST IMPLANTS Bilateral 12/18/2014   Procedure: REMOVAL OF BILATERAL TISSUE EXPANDERS,PLACEMENT SILICONE IMPLANTS ;  Surgeon: [name], MD;  Location: [place];  Service: Plastics;  Laterality: Bilateral;   SIMPLE MASTECTOMY WITH AXILLARY SENTINEL NODE BIOPSY Bilateral 04/02/2014   Procedure: LEFT TOTAL MASTECTOMY WITH LEFT AXILLARY SENTINEL NODE BIOPSY, RIGHT PROPHYLACTIC MASTETCTOMY;  Surgeon: [name], MD;  Location: [place];  Service: General;  Laterality: Bilateral;   TOTAL ABDOMINAL HYSTERECTOMY W/ BILATERAL SALPINGOOPHORECTOMY  1996   TRANSTHORACIC ECHOCARDIOGRAM  02-04-2015   grade 1 diastolic dysfunction, ef 40-81%/  trivial TR   VESICO-VAGINAL FISTULA REPAIR N/A 07/17/2015   Procedure: REPAIR OF RECTOVAGINAL FISTULA;  Surgeon: [name], MD;  Location: [facility];  Service: Gynecology;  Laterality: N/A;   VIDEO BRONCHOSCOPY Bilateral 01/16/2013   Procedure: VIDEO BRONCHOSCOPY WITHOUT FLUORO;  Surgeon: [name], MD;  Location: [facility];  Service: Cardiopulmonary;  Laterality: Bilateral;    Current Medications: Outpatient Medications Prior to Visit  Medication Sig Dispense Refill   acetaminophen (TYLENOL) 325 MG tablet Take 2 tablets (650 mg total) by mouth every 6 (six) hours as needed for mild pain or headache (fever >/= 101).     albuterol (PROVENTIL) (2.5 MG/3ML) 0.083% nebulizer solution Take 3 mLs (2.5 mg total) by nebulization  every 6 (six) hours as needed for wheezing or shortness of breath. 75 mL 30   ALPRAZolam (XANAX) 0.25 MG tablet Take 0.125 mg by mouth daily.     amLODipine (NORVASC) 5 MG tablet Take 0.5 tablets (2.5 mg total) by mouth daily. Before work 45 tablet 3   aspirin EC 81 MG tablet Take 1 tablet (81 mg total) by mouth daily. (Patient taking differently: Take 81 mg by mouth daily. Chewable) 90 tablet 3   Budeson-Glycopyrrol-Formoterol (BREZTRI AEROSPHERE) 160-9-4.8 MCG/ACT AERO Inhale 2 puffs into the lungs in the morning and at bedtime. Rinse mouth after each use 5.9 g 0   Budeson-Glycopyrrol-Formoterol (BREZTRI AEROSPHERE) 160-9-4.8 MCG/ACT AERO Inhale 2 puffs into the lungs in the morning and at bedtime. 10.7 g 3   Diclofenac Potassium,Migraine, 50 MG PACK Take 50 mg by mouth. As Needed for Migraine     ezetimibe (ZETIA) 10 MG tablet Take 1 tablet (10 mg total) by mouth daily. 90 tablet 3   HYDROcodone-acetaminophen (NORCO/VICODIN) 5-325 MG tablet Take 1 tablet by mouth every 6 (six) hours as needed for moderate pain. 30 tablet 0   icosapent Ethyl (VASCEPA) 1 g capsule Take 2 capsules (2 g total) by mouth 2 (two) times daily. 360 capsule 3   metoprolol tartrate (LOPRESSOR) 25 MG tablet TAKE 1.5 TABLET (37.5MG) BY MOUTH IN THE MORNING AND 1.5 TABLETS (37.5MG) IN THE EVENING. 90 tablet 3   montelukast (SINGULAIR) 10 MG tablet Take 10 mg by mouth at bedtime.      omega-3 acid ethyl esters (LOVAZA) 1 g capsule Take 1 capsule (1 g total) by mouth 2 (two) times daily. 60 capsule 11   pantoprazole (PROTONIX) 40 MG tablet Take 1 tablet (40 mg total) by mouth daily. 90 tablet 3   PROAIR HFA 108 (90 BASE) MCG/ACT inhaler Inhale 2 puffs into the lungs every 4 (four) hours as needed for wheezing or shortness of breath. Reported on 07/08/2015     rosuvastatin (CRESTOR) 40 MG tablet Take 1 tablet (40 mg total) by mouth daily. 90 tablet 3   SYNTHROID 100 MCG tablet Take  1 tablet (100 mcg total) by mouth  daily before breakfast. NAME BRAND ONLY 90 tablet 1   TRELEGY ELLIPTA 100-62.5-25 MCG/INH AEPB TAKE 1 PUFF BY MOUTH EVERY DAY 60 each 6   vitamin C (VITAMIN C) 500 MG tablet Take 1 tablet (500 mg total) by mouth daily.     zinc sulfate 220 (50 Zn) MG capsule Take 1 capsule (220 mg total) by mouth daily.     No facility-administered medications prior to visit.     Allergies:   Prednisone   Social History   Socioeconomic History   Marital status: Married    Spouse name: Eddie    Number of children: 4   Years of education: GED   Highest education level: Not on file  Occupational History    Employer: [employer]  Tobacco Use   Smoking status: Current Every Day Smoker    Packs/day: 0.75    Years: 40.00    Pack years: 30.00    Types: Cigarettes, E-cigarettes    Last attempt to quit: 03/29/2014    Years since quitting: 6.5   Smokeless tobacco: Never Used   Tobacco comment: 5-7 cigs daily 06/07/17  Vaping Use   Vaping Use: Never used  Substance and Sexual Activity   Alcohol use: No   Drug use: No   Sexual activity: Not Currently  Other Topics Concern   Not on file  Social History Narrative   Patient lives at home with her husband [name]) Patient works full time.   Education- GED   Right handed.   Caffeine- one cup of coffee daily.   Social Determinants of Health   Financial Resource Strain: Not on file  Food Insecurity: Not on file  Transportation Needs: Not on file  Physical Activity: Not on file  Stress: Not on file  Social Connections: Not on file     Family History:  The patient's family history includes Breast cancer (age of onset: 38) in her paternal aunt; Cancer in her paternal grandfather; Colon cancer (age of onset: 5) in her paternal grandmother; Colon cancer (age of onset: 38) in her father; Heart attack in her maternal grandmother; Heart failure in her mother; Ovarian cancer (age of onset: 26) in her sister; Ovarian cancer (age of onset:  72) in an other family member; Throat cancer (age of onset: 18) in her brother; Throat cancer (age of onset: 59) in her brother.   ROS General: Negative; No fevers, chills, or night sweats;  HEENT: Negative; No changes in vision or hearing, sinus congestion, difficulty swallowing Pulmonary: Negative; No cough, wheezing, shortness of breath, hemoptysis Cardiovascular: See HPI GI: GERD GU: Negative; No dysuria, hematuria, or difficulty voiding Musculoskeletal: Fall resulting in rib fracture and fracture fibula Hematologic/Oncology: History of breast CA Endocrine: Negative; no heat/cold intolerance; no diabetes Neuro: Negative; no changes in balance, headaches Skin: Negative; No rashes or skin lesions Psychiatric: Negative; No behavioral problems, depression Sleep: Negative; No snoring, daytime sleepiness, hypersomnolence, bruxism, restless legs, hypnogognic hallucinations, no cataplexy Other comprehensive 14 point system review is negative.   PHYSICAL EXAM:   VS:  BP 104/66 (BP Location: Left Arm, Patient Position: Sitting)    Pulse 72    Ht 5' 6.5" (1.689 m)    Wt 155 lb (70.3 kg)    SpO2 98%    BMI 24.64 kg/m     Repeat blood pressure by me was 118/70  Wt Readings from Last 3 Encounters:  10/09/20 155 lb (70.3 kg)  04/22/20 150 lb  6.4 oz (68.2 kg)  04/05/20 156 lb (70.8 kg)    General: Alert, oriented, no distress.  Skin: normal turgor, no rashes, warm and dry HEENT: Normocephalic, atraumatic. Pupils equal round and reactive to light; sclera anicteric; extraocular muscles intact;  Nose without nasal septal hypertrophy Mouth/Parynx benign; Mallinpatti scale 3 Neck: No JVD, no carotid bruits; normal carotid upstroke Lungs: clear to ausculatation and percussion; no wheezing or rales Chest wall: without tenderness to palpitation Heart: PMI not displaced, RRR, s1 s2 normal, 1/6 systolic murmur, no diastolic murmur, no rubs, gallops, thrills, or heaves Abdomen: soft, nontender; no  hepatosplenomehaly, BS+; abdominal aorta nontender and not dilated by palpation. Back: no CVA tenderness Pulses 2+ Musculoskeletal: full range of motion, normal strength, no joint deformities Extremities: no clubbing cyanosis or edema, Homan's sign negative  Neurologic: grossly nonfocal; Cranial nerves grossly wnl Psychologic: Normal mood and affect    Studies/Labs Reviewed:   ECG (independently read by me): NSR at 72; IRBBB  April 05, 2020 ECG (independently read by me): NSR at 69, IRBBB, no ectopy  June 27, 2019 ECG (independently read by me): Normal sinus rhythm at 70 bpm.  No ectopy.  Normal intervals.  September 2020 ECG (independently read by me): Normal sinus rhythm at 75 beats per minute.  New mild RV conduction delay V1 V2 with T wave inversion and new T wave inversion in lead V3 and V4.  Normal intervals.  No ectopy  October 29, 2018 ECG (independently read by me): Normal sinus rhythm at 74 bpm.  No ectopy.  Normal intervals.  No ST segment changes.  Recent Labs: BMP Latest Ref Rng & Units 04/08/2020 05/15/2019 04/25/2019  Glucose 65 - 99 mg/dL 88 91 90  BUN 6 - 24 mg/dL '8 12 10  ' Creatinine 0.57 - 1.00 mg/dL 0.81 0.82 0.80  BUN/Creat Ratio 9 - 23 10 - 13  Sodium 134 - 144 mmol/L 141 142 141  Potassium 3.5 - 5.2 mmol/L 4.9 4.2 4.5  Chloride 96 - 106 mmol/L 108(H) 108 105  CO2 20 - 29 mmol/L '21 23 22  ' Calcium 8.7 - 10.2 mg/dL 10.0 9.4 10.0     Hepatic Function Latest Ref Rng & Units 04/08/2020 05/15/2019 03/24/2019  Total Protein 6.0 - 8.5 g/dL 7.0 7.3 7.0  Albumin 3.8 - 4.9 g/dL 4.7 4.1 4.6  AST 0 - 40 IU/L '16 16 20  ' ALT 0 - 32 IU/L '16 16 16  ' Alk Phosphatase 44 - 121 IU/L 120 139(H) 126(H)  Total Bilirubin 0.0 - 1.2 mg/dL 0.5 0.4 0.5    CBC Latest Ref Rng & Units 05/15/2019 04/25/2019 01/16/2019  WBC 4.0 - 10.5 K/uL 4.9 7.3 4.7  Hemoglobin 12.0 - 15.0 g/dL 12.9 12.2 12.7  Hematocrit 36.0 - 46.0 % 37.7 35.8 37.6  Platelets 150 - 400 K/uL 241 249 244   Lab  Results  Component Value Date   MCV 92.4 05/15/2019   MCV 93 04/25/2019   MCV 92 01/16/2019   Lab Results  Component Value Date   TSH 0.052 (L) 04/08/2020   Lab Results  Component Value Date   HGBA1C 5.0 12/26/2012     BNP No results found for: BNP  ProBNP    Component Value Date/Time   PROBNP 326.1 (H) 12/26/2012 0553     Lipid Panel     Component Value Date/Time   CHOL 144 04/08/2020 0814   TRIG 206 (H) 04/08/2020 0814   HDL 43 04/08/2020 0814   CHOLHDL 3.3 04/08/2020 1610  CHOLHDL 4.4 12/26/2012 0340   VLDL 17 12/26/2012 0340   LDLCALC 67 04/08/2020 0814     RADIOLOGY: No results found.   Additional studies/ records that were reviewed today include:  ECHO 02/04/2015 Study Conclusions  - Left ventricle: The cavity size was normal. Systolic function was normal. The estimated ejection fraction was in the range of 50% to 55%. Wall motion was normal; there were no regional wall motion abnormalities. Doppler parameters are consistent with abnormal left ventricular relaxation (grade 1 diastolic dysfunction). There was no evidence of elevated ventricular filling pressure by Doppler parameters. - Aortic valve: Trileaflet; normal thickness leaflets. Transvalvular velocity was within the normal range. There was no stenosis. There was no regurgitation. - Aortic root: The aortic root was normal in size. - Mitral valve: Structurally normal valve. There was no regurgitation. - Left atrium: The atrium was normal in size. - Right ventricle: The cavity size was normal. Wall thickness was normal. Systolic function was normal. - Right atrium: The atrium was normal in size. - Tricuspid valve: There was trivial regurgitation. - Pulmonary arteries: Systolic pressure was within the normal range. - Inferior vena cava: The vessel was normal in size. - Pericardium, extracardiac: There was no pericardial effusion.   ECHO IMPRESSIONS 01/09/2019  1.  The left ventricle has normal systolic function with an ejection fraction of 60-65%. The cavity size was normal. Left ventricular diastolic parameters were normal.  2. The right ventricle has normal systolic function. The cavity was normal. There is no increase in right ventricular wall thickness.  3. Mild thickening of the mitral valve leaflet.  4. The aortic valve is tricuspid. Mild thickening of the aortic valve. Mild calcification of the aortic valve.   Cardiac CT IMPRESSION:02/02/2019  1. Coronary calcium score of 514. This was 99th percentile for age and sex matched control.  2. Normal coronary origin with right dominance.  3. Moderate calcified plaque in the proximal LAD with 50-69%% stenosis. CAD RAD 3.  4. Moderate calcified plaque in the proximal OM with 50-69% stenosis. CAD RAD 3  5. FFR has been sent to evaluate LAD and OM.  6. Recommend Aggressive Risk Factor Modification.  CT FFR ANALYSIS FINDINGS: FFRct analysis was performed on the original cardiac CT angiogram dataset. Diagrammatic representation of the FFRct analysis is provided in a separate PDF document in PACS. This dictation was created using the PDF document and an interactive 3D model of the results. 3D model is not available in the EMR/PACS. Normal FFR range is >0.80.  1. Left Main: No significant stenosis.  FFR LM = 0.99  2. LAD: No significant stenosis. Proximal FFR = 0.98, Mid FFR = 0.84, Distal FFR = 0.75. 3. LCX: No significant stenosis. Proximal FFR = 0.98, Mid FFR = 0.96, Distal FFR = 0.93. 4. RCA: No significant stenosis. Proximal FFR = 0.99, Mid FFR = 0.95, Distal FFR = 0.91. PDA not assessed.  IMPRESSION: 1. CT FFR analysis showed a borderline hemodynamically significant reduction in flow in the distal LAD. This represents either diffuse distal disease or distal tapering of the vessel.  2. Recommend medical management and aggressive risk factor Modification.   CARDIAC CATH:  04/28/2019  Prox LAD to Mid LAD lesion is 30% stenosed.  Mid LAD-1 lesion is 60% stenosed.  Mid LAD-2 lesion is 10% stenosed.  The left ventricular systolic function is normal.  LV end diastolic pressure is low.  1st Diag lesion is 20% stenosed.   Single-vessel CAD with calcification of the proximal to  mid LAD with 20% ostial diagonal stenosis with 30% stenosis of the LAD immediately after the diagonal vessel, 60% smooth mildly calcified stenosis of the LAD in a slight bend in the vessel proximal to the second diagonal takeoff with mild smooth 10% LAD stenosis in the mid LAD.  The LAD is a large caliber and wraps around the LV apex.  Normal left circumflex and dominant RCA.  No LV function with EF estimate at least 55%.  LVEDP 9 mm.  RECOMMENDATION: Medical therapy trial.  The patient did not tolerate isosorbide initiation secondary to precipitation migraine headaches; will initiate amlodipine 2.5 mg at bedtime and adjust beta-blocker therapy with metoprolol tartrate to 25 mg twice a day.  Increase rosuvastatin to 40 mg daily in attempt to induce plaque regression.  Smoking cessation is imperative.      ASSESSMENT:    1. Coronary artery disease involving native coronary artery of native heart without angina pectoris   2. Medication management   3. Essential hypertension   4. Hyperlipidemia with target LDL less than 70   5. Tobacco abuse   6. History of breast cancer   7. Hypothyroidism, unspecified type   8. Covid-19 Virus Infection: April 2020     PLAN:   1.  Chest pain/CAD: Coronary calcium score was elevated at 514 and with documentation of plaque and recurrent symptomatology despite the addition of beta-blocker and nitrates.  Cardiac catheterization showed coronary calcification of the LAD with the tightest stenosis of 60% in the mid vessel.  She had a normal circumflex and RCA.  She continues to be anginal free increase medical regimen of metoprolol 37.5 mg twice a  day and amlodipine 2.5 mg daily.  2.  Essential hypertension: Blood pressure today is excellent at 118/70 on repeat by me on amlodipine/metoprolol regimen as noted above.  3.  Palpitations: These have resolved on her increase metoprolol dose.  Pulse today 72 without ectopy  4.  Hyperlipidemia: Previously documented to be mixed hyperlipidemia with elevation of triglycerides, and VLDL.   September 2021 LDL was 67, but triglycerides remain elevated at 206.  Total cholesterol 144 and HDL 43.  Repeat lipid studies will be obtained.  5.  Hypothyroidism: She continues to be on levothyroxine at 125 mcg daily.  TSH in September 2021 was over suppressed at 0.052 and it was recommended that she reduce her levothyroxine dose to 100 mcg with follow-up by her primary physician.  6.  Tobacco abuse: I again discussed the importance of complete smoking cessation.  Her husband had previously been a smoker but quit tobacco when he developed coronary disease.  She has been smoking for greater than 30 years.  She was referred for lung screening CT imaging and underwent CT of her chest in October 2021 which revealed 9 appearance of left upper lobe pulmonary nodule.  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  Annual screening was recommended  7.  History of COVID-19 requiring 1 week hospitalization: Her symptoms began on April 1.  She states her peak temperature was 104.  Her husband had developed COVID several days prior to her and he had been hospitalized for 13 days.  She received a 5-day course of hydroxychloroquine.  COVID test 2 weeks after discharge was still positive and ultimately in mid-May COVID test was negative.  She had been demonstrated to have a strong antibody response.  Repeat Covid testing prior to her catheterization was negative.  8.  History of breast CA: Diagnosed in 2015, followed by Dr. Jana Fernandez.  No recurrence and currently remains stable   Medication Adjustments/Labs and Tests Ordered: Current  medicines are reviewed at length with the patient today.  Concerns regarding medicines are outlined above.  Medication changes, Labs and Tests ordered today are listed in the Patient Instructions below. Patient Instructions  Medication Instructions:  Continue current medications  *If you need a refill on your cardiac medications before your next appointment, please call your pharmacy*   Lab Work: CBC, CMP, TSH and Fasting Lipid  If you have labs (blood work) drawn today and your tests are completely normal, you will receive your results only by:  [employer] (if you have MyChart) OR  A paper copy in the mail If you have any lab test that is abnormal or we need to change your treatment, we will call you to review the results.   Testing/Procedures: None Ordered   Follow-Up: At [facility], you and your health needs are our priority.  As part of our continuing mission to provide you with exceptional heart care, we have created designated Provider Care Teams.  These Care Teams include your primary Cardiologist (physician) and Advanced Practice Providers (APPs -  Physician Assistants and Nurse Practitioners) who all work together to provide you with the care you need, when you need it.  We recommend signing up for the patient portal called "MyChart".  Sign up information is provided on this After Visit Summary.  MyChart is used to connect with patients for Virtual Visits (Telemedicine).  Patients are able to view lab/test results, encounter notes, upcoming appointments, etc.  Non-urgent messages can be sent to your provider as well.   To learn more about what you can do with MyChart, go to [email or web address].    Your next appointment:   6 month(s)  The format for your next appointment:   In Person  Provider:   You may see Shelva Majestic, MD or one of the following Advanced Practice Providers on your designated Care Team:    Almyra Deforest, PA-C  Fabian Sharp, PA-C or   Roby Lofts, [place]         Signed, Shelva Majestic, MD  10/11/2020 5:49 PM    [facility] [address] [place], [place], [place]  [address] Phone: [phone]

## 2020-10-11 ENCOUNTER — Encounter: Payer: Self-pay | Admitting: Cardiovascular Disease

## 2020-10-28 ENCOUNTER — Other Ambulatory Visit: Payer: Self-pay | Admitting: Cardiovascular Disease

## 2020-11-04 ENCOUNTER — Other Ambulatory Visit: Payer: Self-pay | Admitting: Cardiovascular Disease

## 2020-11-04 NOTE — Telephone Encounter (Signed)
Rx has been sent to the pharmacy electronically. ° °

## 2021-03-18 DIAGNOSIS — K219 Gastro-esophageal reflux disease without esophagitis: Secondary | ICD-10-CM | POA: Diagnosis not present

## 2021-03-18 DIAGNOSIS — R194 Change in bowel habit: Secondary | ICD-10-CM | POA: Diagnosis not present

## 2021-03-18 DIAGNOSIS — R634 Abnormal weight loss: Secondary | ICD-10-CM | POA: Diagnosis not present

## 2021-03-18 DIAGNOSIS — Z86010 Personal history of colonic polyps: Secondary | ICD-10-CM | POA: Diagnosis not present

## 2021-03-31 ENCOUNTER — Telehealth: Payer: Self-pay

## 2021-03-31 NOTE — Telephone Encounter (Signed)
   Name: Stacy Fernandez  DOB: 23-Jan-1961  MRN: [id number]   Primary Cardiologist: Shelva Majestic, MD  Chart reviewed as part of pre-operative protocol coverage. Patient was contacted 03/31/2021 in reference to pre-operative risk assessment for pending surgery as outlined below.  Stacy Fernandez was last seen on 10/09/20 by Dr. Claiborne Billings.  Since that day, Stacy Fernandez has done well.  She can complete more than 4.0 METS without angina. She has nonobstructive CAD by heart cath in 2020. We prefer to continue ASA, but can hold 5-7 days if necessary.  Therefore, based on ACC/AHA guidelines, the patient would be at acceptable risk for the planned procedure without further cardiovascular testing.   The patient was advised that if she develops new symptoms prior to surgery to contact our office to arrange for a follow-up visit, and she verbalized understanding.  I will route this recommendation to the requesting party via Epic fax function and remove from pre-op pool. Please call with questions.  [place], PA 03/31/2021, 5:01 PM

## 2021-03-31 NOTE — Telephone Encounter (Signed)
   [facility] Pre-operative Risk Assessment    Patient Name: Stacy Fernandez  DOB: [birth year] MRN: [id number]  HEARTCARE STAFF:  - IMPORTANT!!!!!! Under Visit Info/Reason for Call, type in Other and utilize the format Clearance MM/DD/YY or Clearance TBD. Do not use dashes or single digits. - Please review there is not already an duplicate clearance open for this procedure. - If request is for dental extraction, please clarify the # of teeth to be extracted. - If the patient is currently at the dentist's office, call Pre-Op Callback Staff (MA/nurse) to input urgent request.  - If the patient is not currently in the dentist office, please route to the Pre-Op pool.  Request for surgical clearance:  What type of surgery is being performed? Colonoscopy with Propofol  When is this surgery scheduled? 04/07/2021  What type of clearance is required (medical clearance vs. Pharmacy clearance to hold med vs. Both)? Medical   Are there any medications that need to be held prior to surgery and how long? None   Practice name and name of physician performing surgery? [facility]   What is the office phone number? [phone]   7.   What is the office fax number? [phone]  8.   Anesthesia type (None, local, MAC, general) ? Unknown    Zebedee Iba 03/31/2021, 12:02 PM  _________________________________________________________________   (provider comments below).ch

## 2021-04-07 ENCOUNTER — Telehealth: Payer: Self-pay | Admitting: Neurology

## 2021-04-07 NOTE — Telephone Encounter (Signed)
Received a call from GI physician Dr. Collene Mares, patient has pending colonoscopy, document is questioning with her reported history of aneurysm, Is it okay to proceed  Last saw patient in 2018 for migraine headaches, last MRA was 2016,  Patient denies a family history of aneurysm, no change in her headache patterns, I see no need to repeat MRA as a preprocedure clearance   IMPRESSION:  Abnormal MRA head (without) demonstrating: 1. Small 13m projection of the left supraclinoid internal carotid artery may represent small aneurysm vs. infundibulum.   2. Remainder of medium-large sized intracranial arteries are unremarkable. 3. Images from prior study not available at this time for comparison. Addendum will be made when images are available. Compared to prior report from 10/06/12, no significant change.

## 2021-04-11 DIAGNOSIS — J449 Chronic obstructive pulmonary disease, unspecified: Secondary | ICD-10-CM | POA: Diagnosis not present

## 2021-04-11 DIAGNOSIS — Z87891 Personal history of nicotine dependence: Secondary | ICD-10-CM | POA: Diagnosis not present

## 2021-04-11 DIAGNOSIS — M79605 Pain in left leg: Secondary | ICD-10-CM | POA: Diagnosis not present

## 2021-04-11 DIAGNOSIS — M4186 Other forms of scoliosis, lumbar region: Secondary | ICD-10-CM | POA: Diagnosis not present

## 2021-04-11 DIAGNOSIS — Z79899 Other long term (current) drug therapy: Secondary | ICD-10-CM | POA: Diagnosis not present

## 2021-04-11 DIAGNOSIS — Z888 Allergy status to other drugs, medicaments and biological substances status: Secondary | ICD-10-CM | POA: Diagnosis not present

## 2021-04-11 DIAGNOSIS — Z7982 Long term (current) use of aspirin: Secondary | ICD-10-CM | POA: Diagnosis not present

## 2021-04-11 DIAGNOSIS — Z91041 Radiographic dye allergy status: Secondary | ICD-10-CM | POA: Diagnosis not present

## 2021-04-11 DIAGNOSIS — M5432 Sciatica, left side: Secondary | ICD-10-CM | POA: Diagnosis not present

## 2021-04-11 DIAGNOSIS — M545 Low back pain, unspecified: Secondary | ICD-10-CM | POA: Diagnosis not present

## 2021-04-11 DIAGNOSIS — M47816 Spondylosis without myelopathy or radiculopathy, lumbar region: Secondary | ICD-10-CM | POA: Diagnosis not present

## 2021-04-11 DIAGNOSIS — M5442 Lumbago with sciatica, left side: Secondary | ICD-10-CM | POA: Diagnosis not present

## 2021-04-13 ENCOUNTER — Other Ambulatory Visit: Payer: Self-pay | Admitting: Cardiovascular Disease

## 2021-04-14 DIAGNOSIS — M5442 Lumbago with sciatica, left side: Secondary | ICD-10-CM | POA: Diagnosis not present

## 2021-04-14 DIAGNOSIS — J449 Chronic obstructive pulmonary disease, unspecified: Secondary | ICD-10-CM | POA: Diagnosis not present

## 2021-04-14 DIAGNOSIS — Z853 Personal history of malignant neoplasm of breast: Secondary | ICD-10-CM | POA: Diagnosis not present

## 2021-04-14 DIAGNOSIS — I729 Aneurysm of unspecified site: Secondary | ICD-10-CM | POA: Diagnosis not present

## 2021-04-14 DIAGNOSIS — J440 Chronic obstructive pulmonary disease with acute lower respiratory infection: Secondary | ICD-10-CM | POA: Diagnosis not present

## 2021-04-14 DIAGNOSIS — E039 Hypothyroidism, unspecified: Secondary | ICD-10-CM | POA: Diagnosis not present

## 2021-05-12 DIAGNOSIS — M4727 Other spondylosis with radiculopathy, lumbosacral region: Secondary | ICD-10-CM | POA: Diagnosis not present

## 2021-05-12 DIAGNOSIS — M4726 Other spondylosis with radiculopathy, lumbar region: Secondary | ICD-10-CM | POA: Diagnosis not present

## 2021-05-12 DIAGNOSIS — M5442 Lumbago with sciatica, left side: Secondary | ICD-10-CM | POA: Diagnosis not present

## 2021-05-12 DIAGNOSIS — M48061 Spinal stenosis, lumbar region without neurogenic claudication: Secondary | ICD-10-CM | POA: Diagnosis not present

## 2021-05-12 DIAGNOSIS — Z853 Personal history of malignant neoplasm of breast: Secondary | ICD-10-CM | POA: Diagnosis not present

## 2021-05-12 DIAGNOSIS — M5116 Intervertebral disc disorders with radiculopathy, lumbar region: Secondary | ICD-10-CM | POA: Diagnosis not present

## 2021-05-13 DIAGNOSIS — Z0000 Encounter for general adult medical examination without abnormal findings: Secondary | ICD-10-CM | POA: Diagnosis not present

## 2021-05-13 DIAGNOSIS — Z13220 Encounter for screening for lipoid disorders: Secondary | ICD-10-CM | POA: Diagnosis not present

## 2021-05-13 DIAGNOSIS — J440 Chronic obstructive pulmonary disease with acute lower respiratory infection: Secondary | ICD-10-CM | POA: Diagnosis not present

## 2021-05-13 DIAGNOSIS — E559 Vitamin D deficiency, unspecified: Secondary | ICD-10-CM | POA: Diagnosis not present

## 2021-05-13 DIAGNOSIS — M5442 Lumbago with sciatica, left side: Secondary | ICD-10-CM | POA: Diagnosis not present

## 2021-05-13 DIAGNOSIS — E039 Hypothyroidism, unspecified: Secondary | ICD-10-CM | POA: Diagnosis not present

## 2021-05-26 ENCOUNTER — Other Ambulatory Visit: Payer: Self-pay | Admitting: *Deleted

## 2021-05-26 DIAGNOSIS — F17210 Nicotine dependence, cigarettes, uncomplicated: Secondary | ICD-10-CM

## 2021-05-29 ENCOUNTER — Telehealth: Payer: Self-pay | Admitting: *Deleted

## 2021-05-29 ENCOUNTER — Encounter: Payer: Self-pay | Admitting: Neurology

## 2021-05-29 ENCOUNTER — Ambulatory Visit (INDEPENDENT_AMBULATORY_CARE_PROVIDER_SITE_OTHER): Payer: BC Managed Care – PPO | Admitting: Neurology

## 2021-05-29 VITALS — BP 102/78 | HR 70 | Ht 66.0 in | Wt 153.0 lb

## 2021-05-29 DIAGNOSIS — I671 Cerebral aneurysm, nonruptured: Secondary | ICD-10-CM | POA: Diagnosis not present

## 2021-05-29 DIAGNOSIS — G43709 Chronic migraine without aura, not intractable, without status migrainosus: Secondary | ICD-10-CM | POA: Insufficient documentation

## 2021-05-29 MED ORDER — UBRELVY 50 MG PO TABS: ORAL_TABLET | ORAL | 11 refills | Status: AC

## 2021-05-29 MED ORDER — AIMOVIG 70 MG/ML ~~LOC~~ SOAJ: 70.0000 mg | SUBCUTANEOUS | 11 refills | Status: DC

## 2021-05-29 NOTE — Progress Notes (Signed)
Chief Complaint  Patient presents with   New Patient (Initial Visit)    RM 89, With husband       ASSESSMENT AND PLAN  Stacy Fernandez is a 60 y.o. 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 infundibulum.  Repeat MRA of the brain to establish stability, that is required by her GI physician prior to EGD and colonoscopy  Chronic migraine headaches,  Continue has 1-2 migraines each week,  Denies significant improvement with previous Botox injection has migraine prevention, also tried and failed Topamax, propanolol, Effexor in the past,  Will add on Aimovig 70 mg monthly as preventive medication,  Ubrelvy 50 mg as needed for abortive treatment, may combine with diclofenac potassium as needed  DIAGNOSTIC DATA (LABS, IMAGING, TESTING) - I reviewed patient records, labs, notes, testing and imaging myself where available.   MEDICAL HISTORY:  Tonjua Rossetti, is a 60 year old female seen in request by her primary care nurse practitioner Bradly Bienenstock for evaluation of frequent migraine headache, aneurysm,  I reviewed and summarized the referring note.  Past medical history Hypertension Hypothyroidism, on supplement Hyperlipidemia History of left breast cancer, status post lobectomy, chemotherapy in 2015  Our office saw her previously since 2015 for chronic migraine headaches, she describes frequent headaches since 2013, lateralized severe pounding headache with light noise sensitivity, mostly on the left side, often woke up from sleep with headaches  Previously had extensive imaging study,  MRI of the brain with and without contrast in June 2018 was normal MRA of brain without contrast showed incidental finding of 2 mm projection of the left supraclinoid internal carotid artery, likely represent small aneurysm versus  infundibulum  For migraine preventive medication, previously she has tried and failed Topamax, propanolol, Effexor, also going through regular Botox injection every 3 months for a while in 2018 without significant improvement, she quit our office since then  She reported her migraine is overall under better control now, but still has 1-2 migraine each week, previously Cathren Harsh works very well as abortive treatment, but now insurance no longer covers it, she takes diclofenac potassium tablet as needed, 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  PHYSICAL EXAM:   Vitals:   05/29/21 0913  Weight: 153 lb (69.4 kg)  Height: 5\' 6"  (1.676 m)   Not recorded     Body mass index is 24.69 kg/m.  PHYSICAL EXAMNIATION:  Gen: NAD, conversant, well nourised, well groomed                     Cardiovascular: Regular rate rhythm, no peripheral edema, warm, nontender. Eyes: Conjunctivae clear without exudates or hemorrhage Neck: Supple, no carotid bruits. Pulmonary: Clear to auscultation bilaterally   NEUROLOGICAL EXAM:  MENTAL STATUS: Depressed looking middle-aged female Speech:    Speech is normal; fluent and spontaneous with normal comprehension.  Cognition:     Orientation to time, place and person     Normal recent and remote memory     Normal Attention span and concentration     Normal Language, naming, repeating,spontaneous speech  Fund of knowledge   CRANIAL NERVES: CN II: Visual fields are full to confrontation. Pupils are round equal and briskly reactive to light. CN III, IV, VI: extraocular movement are normal. No ptosis. CN V: Facial sensation is intact to light touch CN VII: Face is  symmetric with normal eye closure  CN VIII: Hearing is normal to causal conversation. CN IX, X: Phonation is normal. CN XI: Head turning and shoulder shrug are intact  MOTOR: There is no pronator drift of out-stretched arms. Muscle bulk and tone are normal. Muscle strength is normal.  REFLEXES: Reflexes are 2+ and symmetric at the biceps, triceps, knees, and ankles. Plantar responses are flexor.  SENSORY: Intact to light touch, pinprick and vibratory sensation are intact in fingers and toes.  COORDINATION: There is no trunk or limb dysmetria noted.  GAIT/STANCE: Posture is normal. Gait is steady with normal steps, base, arm swing, and turning. Heel and toe walking are normal. Tandem gait is normal.  Romberg is absent.  REVIEW OF SYSTEMS:  Full 14 system review of systems performed and notable only for as above All other review of systems were negative.   ALLERGIES: Allergies  Allergen Reactions   Prednisone Shortness Of Breath and Swelling    CAN TOLERATE IF GIVEN BENADRYL PRIOR     HOME MEDICATIONS: Current Outpatient Medications  Medication Sig Dispense Refill   acetaminophen (TYLENOL) 325 MG tablet Take 2 tablets (650 mg total) by mouth every 6 (six) hours as needed for mild pain or headache (fever >/= 101).     albuterol (PROVENTIL) (2.5 MG/3ML) 0.083% nebulizer solution Take 3 mLs (2.5 mg total) by nebulization every 6 (six) hours as needed for wheezing or shortness of breath. 75 mL 30   amLODipine (NORVASC) 5 MG tablet Take 0.5 tablets (2.5 mg total) by mouth daily. Before work 45 tablet 3   aspirin EC 81 MG tablet Take 1 tablet (81 mg total) by mouth daily. (Patient taking differently: Take 81 mg by mouth daily. Chewable) 90 tablet 3   Diclofenac Potassium,Migraine, 50 MG PACK Take 50 mg by mouth. As Needed for Migraine     ezetimibe (ZETIA) 10 MG tablet TAKE 1 TABLET BY MOUTH EVERY DAY 90 tablet 3   HYDROcodone-acetaminophen (NORCO/VICODIN) 5-325 MG tablet Take 1  tablet by mouth every 6 (six) hours as needed for moderate pain. 30 tablet 0   icosapent Ethyl (VASCEPA) 1 g capsule Take 2 capsules (2 g total) by mouth 2 (two) times daily. 360 capsule 3   metoprolol tartrate (LOPRESSOR) 25 MG tablet TAKE 1.5 TABLET (37.5MG ) BY MOUTH IN THE MORNING AND 1.5 TABLETS (37.5MG ) IN THE EVENING. 90 tablet 3   montelukast (SINGULAIR) 10 MG tablet Take 10 mg by mouth at bedtime.      omega-3 acid ethyl esters (LOVAZA) 1 g capsule TAKE 1 CAPSULE BY MOUTH 2 (TWO) TIMES DAILY. 60 capsule 11   pantoprazole (PROTONIX) 40 MG tablet Take 1 tablet (40 mg total) by mouth daily. 90 tablet 3   PROAIR HFA 108 (90 BASE) MCG/ACT inhaler Inhale 2 puffs into the lungs every 4 (four) hours as needed for wheezing or shortness of breath. Reported on 07/08/2015     rosuvastatin (CRESTOR) 40 MG tablet Take 1 tablet (40 mg total) by mouth daily. 90 tablet 3   SYNTHROID 100 MCG tablet TAKE 1 TABLET (100 MCG TOTAL) BY MOUTH DAILY BEFORE BREAKFAST. NAME BRAND ONLY 90 tablet 2   TRELEGY ELLIPTA 100-62.5-25 MCG/INH AEPB TAKE 1 PUFF BY  MOUTH EVERY DAY 60 each 6   vitamin C (VITAMIN C) 500 MG tablet Take 1 tablet (500 mg total) by mouth daily.     zinc sulfate 220 (50 Zn) MG capsule Take 1 capsule (220 mg total) by mouth daily.     No current facility-administered medications for this visit.    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  infundibulum--  residual chronic migraine   Wears dentures    upper   Wears glasses     PAST SURGICAL HISTORY: Past Surgical History:  Procedure Laterality Date   APPENDECTOMY  1992   BREAST RECONSTRUCTION WITH PLACEMENT OF TISSUE EXPANDER AND FLEX HD (ACELLULAR HYDRATED DERMIS) Bilateral 09/25/2014   Procedure: PLACEMENT OF BILATERAL TISSUE EXPANDER AND  ACELLULAR DERMIS FOR BREAST RECONSTRUCTION ;  Surgeon: [name], MD;  Location: [facility];  Service: Plastics;  Laterality: Bilateral;   CHOLECYSTECTOMY  1999   ESOPHAGOGASTRODUODENOSCOPY (EGD) WITH PROPOFOL  01/18/2013   LAPAROSCOPIC NISSEN FUNDOPLICATION  [phone]   LEFT HEART CATH AND CORONARY ANGIOGRAPHY N/A 04/28/2019   Procedure: LEFT HEART CATH AND CORONARY ANGIOGRAPHY;  Surgeon: Troy Sine, MD;  Location: [facility];  Service: Cardiovascular;  Laterality: N/A;   LEFT HEART CATHETERIZATION WITH CORONARY ANGIOGRAM N/A 12/26/2012   Procedure: LEFT HEART CATHETERIZATION WITH CORONARY ANGIOGRAM;  Surgeon: Lorretta Harp, MD;  Location: [facility];  Service: Cardiovascular;  Laterality: N/A;   Nonobstructive CAD/  40-50% ostial D1/  normal LVF, ef 60%   LIPOSUCTION WITH LIPOFILLING Bilateral 12/18/2014   Procedure: LIPOFILLING TO BILATERAL CHEST;  Surgeon: [name], MD;  Location: [facility];  Service: Plastics;  Laterality: Bilateral;   PORT-A-CATH REMOVAL Right 06/28/2015   Procedure: REMOVAL PORT-A-CATH;  Surgeon: Leighton Ruff, MD;  Location: [facility];  Service: General;  Laterality: Right;   PORTACATH  PLACEMENT N/A 04/02/2014   Procedure: INSERTION PORT-A-CATH;  Surgeon: [name], MD;  Location: [facility];  Service: General;  Laterality: N/A;   REMOVAL OF BILATERAL TISSUE EXPANDERS WITH PLACEMENT OF BILATERAL BREAST IMPLANTS Bilateral 12/18/2014   Procedure: REMOVAL OF BILATERAL TISSUE EXPANDERS,PLACEMENT SILICONE IMPLANTS ;  Surgeon: [name], MD;  Location: [facility] [place] [facility];  Service: Plastics;  Laterality: Bilateral;   SIMPLE MASTECTOMY WITH AXILLARY SENTINEL NODE BIOPSY Bilateral 04/02/2014   Procedure: LEFT TOTAL MASTECTOMY WITH LEFT AXILLARY SENTINEL NODE BIOPSY, RIGHT PROPHYLACTIC MASTETCTOMY;  Surgeon: Renelda Loma  Dalbert Batman, MD;  Location: [facility];  Service: General;  Laterality: Bilateral;   TOTAL ABDOMINAL HYSTERECTOMY W/ BILATERAL SALPINGOOPHORECTOMY  1996   TRANSTHORACIC ECHOCARDIOGRAM  02-04-2015   grade 1 diastolic dysfunction, ef 82-50%/  trivial TR   VESICO-VAGINAL FISTULA REPAIR N/A 07/17/2015   Procedure: REPAIR OF RECTOVAGINAL FISTULA;  Surgeon: [name], MD;  Location: [facility];  Service: Gynecology;  Laterality: N/A;   VIDEO BRONCHOSCOPY Bilateral 01/16/2013   Procedure: VIDEO BRONCHOSCOPY WITHOUT FLUORO;  Surgeon: [name], MD;  Location: [facility];  Service: Cardiopulmonary;  Laterality: Bilateral;    FAMILY HISTORY: Family History  Problem Relation Age of Onset   Heart failure Mother    Colon cancer Father 43       stomach cancer also in 33s   Throat cancer Brother 73       smoker   Heart attack Maternal Grandmother    Colon cancer Paternal Grandmother 34   Cancer Paternal Grandfather        kidney and bladder   Ovarian cancer Sister 61       ovarian cancer at 73, colorectal cancer at 51   Throat cancer Brother 37       throat cancer, smoker   Breast cancer Paternal Aunt 57   Ovarian cancer Other 50       niece with ovarian cancer    SOCIAL HISTORY: Social History   Socioeconomic History   Marital status: Married    Spouse name:  Eddie    Number of children: 4   Years of education: GED   Highest education level: Not on file  Occupational History    Employer: [employer]  Tobacco Use   Smoking status: Every Day    Packs/day: 0.75    Years: 40.00    Pack years: 30.00    Types: Cigarettes, E-cigarettes    Last attempt to quit: 03/29/2014    Years since quitting: 7.1   Smokeless tobacco: Never   Tobacco comments:    5-7 cigs daily 06/07/17  Vaping Use   Vaping Use: Never used  Substance and Sexual Activity   Alcohol use: No   Drug use: No   Sexual activity: Not Currently  Other Topics Concern   Not on file  Social History Narrative   Patient lives at home with her husband [name]) Patient works full time.   Education- GED   Right handed.   Caffeine- one cup of coffee daily.   Social Determinants of Health   Financial Resource Strain: Not on file  Food Insecurity: Not on file  Transportation Needs: Not on file  Physical Activity: Not on file  Stress: Not on file  Social Connections: Not on file  Intimate Partner Violence: Not on file      Marcial Pacas, M.D. Ph.D.  [facility] [address], [place], [place] [address] Ph: [phone] Fax: [phone]  CC:  Jettie Booze, NP [facility] [address],  [place] [address]  Jettie Booze, NP

## 2021-05-29 NOTE — Patient Instructions (Signed)
Meds ordered this encounter  Medications   Erenumab-aooe (AIMOVIG) 70 MG/ML SOAJ    Sig: Inject 70 mg into the skin every 30 (thirty) days.    Dispense:  1 mL    Refill:  11   Ubrogepant (UBRELVY) 50 MG TABS    Sig: Take 1 tab at onset of migraine.  May repeat in 2 hrs, if needed.  Max dose: 2 tabs/day. This is a 30 day prescription.    Dispense:  12 tablet    Refill:  11     May mixed with Diclofenac Potassium as needed for headache.

## 2021-05-29 NOTE — Telephone Encounter (Addendum)
Patient has pharmacy coverage with [employer] [phone]).  PA for Aimovig started on covermymeds (key: [id number]). Case [id number] approved through 05/29/2022.  PA for Ubrelvy 50mg  started on covermymeds (key: [id number]). Case [id number] approved through 05/29/2022.

## 2021-06-02 ENCOUNTER — Telehealth: Payer: Self-pay | Admitting: Neurology

## 2021-06-02 NOTE — Telephone Encounter (Signed)
LVM for pt to call back to schedule  [employer] auth: NPR spoke to Stann Mainland Ref # [id number]

## 2021-06-03 NOTE — Telephone Encounter (Signed)
Patient returned my call she is scheduled at [facility] for 06/10/21.

## 2021-06-06 ENCOUNTER — Ambulatory Visit (HOSPITAL_BASED_OUTPATIENT_CLINIC_OR_DEPARTMENT_OTHER)
Admission: RE | Admit: 2021-06-06 | Discharge: 2021-06-06 | Disposition: A | Discharge status: Home / Self Care | Payer: BC Managed Care – PPO | Source: Ambulatory Visit | Attending: Acute Care | Admitting: Acute Care

## 2021-06-06 ENCOUNTER — Other Ambulatory Visit: Payer: Self-pay

## 2021-06-06 DIAGNOSIS — F17210 Nicotine dependence, cigarettes, uncomplicated: Secondary | ICD-10-CM | POA: Diagnosis not present

## 2021-06-06 DIAGNOSIS — I700 Atherosclerosis of aorta: Secondary | ICD-10-CM | POA: Diagnosis not present

## 2021-06-06 DIAGNOSIS — J439 Emphysema, unspecified: Secondary | ICD-10-CM | POA: Diagnosis not present

## 2021-06-06 DIAGNOSIS — Z122 Encounter for screening for malignant neoplasm of respiratory organs: Secondary | ICD-10-CM | POA: Insufficient documentation

## 2021-06-10 ENCOUNTER — Ambulatory Visit (INDEPENDENT_AMBULATORY_CARE_PROVIDER_SITE_OTHER): Payer: BC Managed Care – PPO

## 2021-06-10 ENCOUNTER — Other Ambulatory Visit: Payer: Self-pay | Admitting: Acute Care

## 2021-06-10 ENCOUNTER — Other Ambulatory Visit: Payer: Self-pay

## 2021-06-10 DIAGNOSIS — Z87891 Personal history of nicotine dependence: Secondary | ICD-10-CM

## 2021-06-10 DIAGNOSIS — I671 Cerebral aneurysm, nonruptured: Secondary | ICD-10-CM

## 2021-06-10 DIAGNOSIS — F17210 Nicotine dependence, cigarettes, uncomplicated: Secondary | ICD-10-CM

## 2021-06-10 DIAGNOSIS — F17200 Nicotine dependence, unspecified, uncomplicated: Secondary | ICD-10-CM

## 2021-06-18 DIAGNOSIS — Z79891 Long term (current) use of opiate analgesic: Secondary | ICD-10-CM | POA: Diagnosis not present

## 2021-06-18 DIAGNOSIS — M47816 Spondylosis without myelopathy or radiculopathy, lumbar region: Secondary | ICD-10-CM | POA: Diagnosis not present

## 2021-06-18 DIAGNOSIS — M7918 Myalgia, other site: Secondary | ICD-10-CM | POA: Diagnosis not present

## 2021-06-23 DIAGNOSIS — Z20822 Contact with and (suspected) exposure to covid-19: Secondary | ICD-10-CM | POA: Diagnosis not present

## 2021-06-23 DIAGNOSIS — R6889 Other general symptoms and signs: Secondary | ICD-10-CM | POA: Diagnosis not present

## 2021-06-23 DIAGNOSIS — J440 Chronic obstructive pulmonary disease with acute lower respiratory infection: Secondary | ICD-10-CM | POA: Diagnosis not present

## 2021-07-02 ENCOUNTER — Other Ambulatory Visit: Payer: Self-pay | Admitting: Cardiovascular Disease

## 2021-09-09 ENCOUNTER — Telehealth: Payer: Self-pay | Admitting: Pulmonary Disease

## 2021-09-10 NOTE — Telephone Encounter (Signed)
Dr Elsworth Soho Please advise:  Called and spoke to patient requesting Trelegy. Patient has not been seen since October of 2021. Patient states she was on Trelegy however based off of last telephone note on 09/10/20 states patient switched from Trelegy to [place]. Patient states Judithann Sauger was to strong for her. Due to her being overdue with her appt I scheduled a follow up with Dr Elsworth Soho on 10/09/21 at 2:45 pm.    Are we able to send in for Trelegy 100 for 1 month supply until her next office visit?

## 2021-09-11 MED ORDER — TRELEGY ELLIPTA 100-62.5-25 MCG/ACT IN AEPB: 100.0000 ug | INHALATION_SPRAY | Freq: Every day | RESPIRATORY_TRACT | 0 refills | Status: DC

## 2021-09-11 NOTE — Telephone Encounter (Signed)
Lm for patient.  

## 2021-10-09 ENCOUNTER — Encounter: Payer: Self-pay | Admitting: Pulmonary Disease

## 2021-10-09 ENCOUNTER — Other Ambulatory Visit: Payer: Self-pay

## 2021-10-09 ENCOUNTER — Ambulatory Visit (INDEPENDENT_AMBULATORY_CARE_PROVIDER_SITE_OTHER): Payer: BC Managed Care – PPO | Admitting: Pulmonary Disease

## 2021-10-09 DIAGNOSIS — J432 Centrilobular emphysema: Secondary | ICD-10-CM

## 2021-10-09 DIAGNOSIS — Z720 Tobacco use: Secondary | ICD-10-CM

## 2021-10-09 MED ORDER — NICOTINE 10 MG IN INHA: 1.0000 | RESPIRATORY_TRACT | 0 refills | Status: DC | PRN

## 2021-10-09 MED ORDER — TRELEGY ELLIPTA 100-62.5-25 MCG/ACT IN AEPB: 100.0000 ug | INHALATION_SPRAY | Freq: Every day | RESPIRATORY_TRACT | 12 refills | Status: DC

## 2021-10-09 NOTE — Progress Notes (Signed)
? ?  Subjective:  ? ? Patient ID: Stacy Fernandez, female    DOB: 05-09-1961, 61 y.o.   MRN: [id number] ? ?HPI ? ? ?61 yo heavy ex-smoker for FU of COPD ?Her sister was [name] ?She smoked more than 30-pack-years and quit in 03/2014. ?PMH - s/p chemotherapy and hormonal therapy for breast cancer.   ?-Nissen's fundoplication in 4034  ?-Covid infection 10/2018 , needed  O2 x few weeks ?- strong F/h/o colon CA & lung/ throat CA ?-CAD ? ? ? ?Chief Complaint  ?Patient presents with  ? Follow-up  ?  Overdue follow up. Pt states she is doing ok. Having a persistent cough since 2020. Mucus is brown to yellow.   ? ?Last seen 08/2019 , she continues to smoke about a pack per day. ?She is back to working for shift. ?She has lost 20 pounds over the last 2 years to her current weight of 144 pounds. ?She reports bruising on her arms. ? ?Reviewed cardiac evaluation, CT coronaries showed distal LAD blockage ? ?She needs refills on Trelegy, this is working better than her previous medications.  She does not have to use albuterol much for rescue.  No interim COPD flares ? ?Significant tests/ events reviewed ? ?PFTs 08/2019-ratio 65, FEV1 68%/1.86, FVC 2.86/82%, TLC 98%, DLCO 10.6/50%, no bronchodilator response ?  ?  ?Left heart cath -no critical CAD ? ?LDCT 05/2021 RADS -2, no nodules ?  ?CT chest in 2014 had suggested endobronchial nodule in the right mainstem, bronchoscopy was nondiagnostic, follow-up CT chest in 2016 showed resolution of these nodules, also PET scan in 06/2015 was negative ?  ?CPET 11/2012 showed mildly reduced functional status with O2 of 76% predicted.   ?Spirometry  11/2012 -moderate airway obstruction with FEV1 of 58% FVC of 71% and ratio 65, diffusion was 38%   ?  ?Spirometry 11/2014  ratio 65, FEV1 61%, FVC 75% ? ? ?Review of Systems ?Patient denies significant dyspnea,cough, hemoptysis,  chest pain, palpitations, pedal edema, orthopnea, paroxysmal nocturnal dyspnea, lightheadedness, nausea, vomiting, abdominal  or  leg pains  ? ?   ?Objective:  ? Physical Exam ? ? ?Gen. Pleasant, well-nourished, in no distress ?ENT - no thrush, no pallor/icterus,no post nasal drip ?Neck: No JVD, no thyromegaly, no carotid bruits ?Lungs: no use of accessory muscles, no dullness to percussion, clear without rales or rhonchi  ?Cardiovascular: Rhythm regular, heart sounds  normal, no murmurs or gallops, no peripheral edema ?Musculoskeletal: No deformities, no cyanosis or clubbing  ? ? ? ? ?   ?Assessment & Plan:  ? ? ?

## 2021-10-09 NOTE — Assessment & Plan Note (Signed)
Refills will be provided on Trelegy for a year. ?Albuterol will be continued for rescue ?

## 2021-10-09 NOTE — Patient Instructions (Signed)
?  X refills on Trelegy x 1 year ? ?X Rx for nicotrol inhaler #150 ? ? ?

## 2021-10-09 NOTE — Assessment & Plan Note (Signed)
Exacerbation of his eyes the most important intervention that would add years to her life. ?She has tried Wellbutrin and nicotine patches in the past. ?She has tried vaping in the past ?We will provide her a prescription for Nicotrol inhaler ?She was not willing to commit to a quit date but was ready to cut down ?

## 2021-11-11 ENCOUNTER — Other Ambulatory Visit: Payer: Self-pay | Admitting: Cardiovascular Disease

## 2021-11-17 HISTORY — PX: COLONOSCOPY: SHX174

## 2022-02-22 ENCOUNTER — Emergency Department (HOSPITAL_BASED_OUTPATIENT_CLINIC_OR_DEPARTMENT_OTHER): Payer: BC Managed Care – PPO

## 2022-02-22 ENCOUNTER — Emergency Department (HOSPITAL_BASED_OUTPATIENT_CLINIC_OR_DEPARTMENT_OTHER)
Admission: EM | Admit: 2022-02-22 | Discharge: 2022-02-22 | Disposition: A | Discharge status: Home / Self Care | Payer: BC Managed Care – PPO | Attending: Emergency Medicine | Admitting: Emergency Medicine

## 2022-02-22 ENCOUNTER — Encounter (HOSPITAL_BASED_OUTPATIENT_CLINIC_OR_DEPARTMENT_OTHER): Payer: Self-pay

## 2022-02-22 DIAGNOSIS — Z20822 Contact with and (suspected) exposure to covid-19: Secondary | ICD-10-CM | POA: Diagnosis not present

## 2022-02-22 DIAGNOSIS — J441 Chronic obstructive pulmonary disease with (acute) exacerbation: Secondary | ICD-10-CM | POA: Diagnosis not present

## 2022-02-22 DIAGNOSIS — Z853 Personal history of malignant neoplasm of breast: Secondary | ICD-10-CM | POA: Insufficient documentation

## 2022-02-22 DIAGNOSIS — N189 Chronic kidney disease, unspecified: Secondary | ICD-10-CM | POA: Diagnosis not present

## 2022-02-22 DIAGNOSIS — Z79899 Other long term (current) drug therapy: Secondary | ICD-10-CM | POA: Insufficient documentation

## 2022-02-22 DIAGNOSIS — Z7982 Long term (current) use of aspirin: Secondary | ICD-10-CM | POA: Diagnosis not present

## 2022-02-22 DIAGNOSIS — R059 Cough, unspecified: Secondary | ICD-10-CM | POA: Diagnosis present

## 2022-02-22 DIAGNOSIS — J432 Centrilobular emphysema: Secondary | ICD-10-CM

## 2022-02-22 DIAGNOSIS — R0602 Shortness of breath: Secondary | ICD-10-CM | POA: Insufficient documentation

## 2022-02-22 DIAGNOSIS — Z7951 Long term (current) use of inhaled steroids: Secondary | ICD-10-CM | POA: Diagnosis not present

## 2022-02-22 DIAGNOSIS — R072 Precordial pain: Secondary | ICD-10-CM | POA: Insufficient documentation

## 2022-02-22 DIAGNOSIS — Z720 Tobacco use: Secondary | ICD-10-CM

## 2022-02-22 LAB — COMPREHENSIVE METABOLIC PANEL
ALT: 15 U/L (ref 0–44)
AST: 17 U/L (ref 15–41)
Albumin: 4.7 g/dL (ref 3.5–5.0)
Alkaline Phosphatase: 66 U/L (ref 38–126)
Anion gap: 12 (ref 5–15)
BUN: 11 mg/dL (ref 6–20)
CO2: 23 mmol/L (ref 22–32)
Calcium: 9.4 mg/dL (ref 8.9–10.3)
Chloride: 105 mmol/L (ref 98–111)
Creatinine, Ser: 0.85 mg/dL (ref 0.44–1.00)
GFR, Estimated: >60 mL/min (ref >60)
Glucose, Bld: 87 mg/dL (ref 70–99)
Potassium: 3.8 mmol/L (ref 3.5–5.1)
Sodium: 140 mmol/L (ref 135–145)
Total Bilirubin: 0.7 mg/dL (ref 0.3–1.2)
Total Protein: 7.5 g/dL (ref 6.5–8.1)

## 2022-02-22 LAB — CBC WITH DIFFERENTIAL/PLATELET
Abs Immature Granulocytes: 0 10*3/uL (ref 0.00–0.07)
Basophils Absolute: 0 10*3/uL (ref 0.0–0.1)
Basophils Relative: 1 %
Eosinophils Absolute: 0 10*3/uL (ref 0.0–0.5)
Eosinophils Relative: 0 %
HCT: 36.2 % (ref 36.0–46.0)
Hemoglobin: 12.4 g/dL (ref 12.0–15.0)
Immature Granulocytes: 0 %
Lymphocytes Relative: 18 %
Lymphs Abs: 0.8 10*3/uL (ref 0.7–4.0)
MCH: 32.1 pg (ref 26.0–34.0)
MCHC: 34.3 g/dL (ref 30.0–36.0)
MCV: 93.8 fL (ref 80.0–100.0)
Monocytes Absolute: 0.3 10*3/uL (ref 0.1–1.0)
Monocytes Relative: 8 %
Neutro Abs: 3.2 10*3/uL (ref 1.7–7.7)
Neutrophils Relative %: 73 %
Platelets: 181 10*3/uL (ref 150–400)
RBC: 3.86 MIL/uL — ABNORMAL LOW (ref 3.87–5.11)
RDW: 11.9 % (ref 11.5–15.5)
WBC: 4.3 10*3/uL (ref 4.0–10.5)
nRBC: 0 % (ref 0.0–0.2)

## 2022-02-22 LAB — RESP PANEL BY RT-PCR (FLU A&B, COVID) ARPGX2
Influenza A by PCR: NEGATIVE
Influenza B by PCR: NEGATIVE
SARS Coronavirus 2 by RT PCR: NEGATIVE

## 2022-02-22 LAB — TROPONIN I (HIGH SENSITIVITY): Troponin I (High Sensitivity): <2 ng/L (ref <18)

## 2022-02-22 MED ORDER — MAGNESIUM SULFATE 2 GM/50ML IV SOLN
2.0000 g | Freq: Once | INTRAVENOUS | Status: AC
  Administered 2022-02-22: 2 g via INTRAVENOUS
  Filled 2022-02-22: qty 50

## 2022-02-22 MED ORDER — IPRATROPIUM-ALBUTEROL 0.5-2.5 (3) MG/3ML IN SOLN: 6.0000 mL | Freq: Once | RESPIRATORY_TRACT | Status: AC

## 2022-02-22 MED ORDER — BENZONATATE 100 MG PO CAPS
200.0000 mg | ORAL_CAPSULE | Freq: Once | ORAL | Status: AC
  Administered 2022-02-22: 200 mg via ORAL
  Filled 2022-02-22: qty 2

## 2022-02-22 MED ORDER — ALBUTEROL SULFATE (2.5 MG/3ML) 0.083% IN NEBU
INHALATION_SOLUTION | RESPIRATORY_TRACT | Status: AC
  Administered 2022-02-22: 10 mg via RESPIRATORY_TRACT
  Filled 2022-02-22: qty 12

## 2022-02-22 MED ORDER — ALBUTEROL SULFATE (2.5 MG/3ML) 0.083% IN NEBU: 2.5000 mg | INHALATION_SOLUTION | Freq: Four times a day (QID) | RESPIRATORY_TRACT | 0 refills | Status: AC | PRN

## 2022-02-22 MED ORDER — ALBUTEROL SULFATE (2.5 MG/3ML) 0.083% IN NEBU: 10.0000 mg | INHALATION_SOLUTION | RESPIRATORY_TRACT | Status: AC

## 2022-02-22 MED ORDER — METHYLPREDNISOLONE SODIUM SUCC 125 MG IJ SOLR
125.0000 mg | Freq: Once | INTRAMUSCULAR | Status: AC
  Administered 2022-02-22: 125 mg via INTRAVENOUS
  Filled 2022-02-22: qty 2

## 2022-02-22 MED ORDER — IPRATROPIUM-ALBUTEROL 0.5-2.5 (3) MG/3ML IN SOLN
RESPIRATORY_TRACT | Status: AC
Start: 2022-02-22 — End: 2022-02-22
  Administered 2022-02-22: 6 mL via RESPIRATORY_TRACT
  Filled 2022-02-22: qty 6

## 2022-02-22 MED ORDER — AEROCHAMBER PLUS FLO-VU MISC
1.0000 | Freq: Once | Status: AC
  Administered 2022-02-22: 1
  Filled 2022-02-22: qty 1

## 2022-02-22 NOTE — ED Notes (Signed)
Dc instructions and scripts reviewed with pt no questions or concerns at this time. Pt ambulated out of ED with daughter. Declined wheelchair.

## 2022-02-22 NOTE — ED Provider Notes (Signed)
[facility] EMERGENCY DEPT Provider Note   CSN: [id number] Arrival date & time: 02/22/22  1331     History  Chief Complaint  Patient presents with   Cough   HPI Stacy Fernandez is a 61 y.o. female with past medical history of COPD, breast cancer, and CKD presenting today with shortness of breath.  Reports that she started to have a "cold" this past Friday.  At that time she noticed that her work of breathing had increased.  She also had excessive coughing and subsequent chest pain which she locates substernally and about the rib cage bilaterally.  She attributes her chest pain to persistent coughing over the past 2 days.  She has used Trelegy and albuterol inhalers to relieve her symptoms.  They helped but only a little.  She reports that she ran out of refills for her nebulizer at home a couple months ago but really has not needed it until her cold this past weekend.  She also took NyQuil which helped with her cold and improved sleep last night.  She reports that she continues to smoke 10 cigarettes/day.   Cough Associated symptoms: shortness of breath       Home Medications Prior to Admission medications   Medication Sig Start Date End Date Taking? Authorizing Provider  acetaminophen (TYLENOL) 325 MG tablet Take 2 tablets (650 mg total) by mouth every 6 (six) hours as needed for mild pain or headache (fever >/= 101). 10/29/18   Georgette Shell, MD  albuterol (PROVENTIL) (2.5 MG/3ML) 0.083% nebulizer solution Take 3 mLs (2.5 mg total) by nebulization every 6 (six) hours as needed for wheezing or shortness of breath. 02/22/22   Harriet Pho, PA-C  ALPRAZolam Duanne Moron) 0.25 MG tablet Take 0.25 mg by mouth 2 (two) times daily. 08/01/21   [provider]  aspirin EC 81 MG tablet Take 1 tablet (81 mg total) by mouth daily. Patient taking differently: Take 81 mg by mouth daily. Chewable 01/05/19   Troy Sine, MD  Erenumab-aooe (AIMOVIG) 70 MG/ML SOAJ Inject 70  mg into the skin every 30 (thirty) days. 05/29/21   Marcial Pacas, MD  Fluticasone-Umeclidin-Vilant (TRELEGY ELLIPTA) 100-62.5-25 MCG/ACT AEPB Inhale 100 mcg into the lungs daily. 10/09/21   [name], MD  HYDROcodone-acetaminophen (NORCO/VICODIN) 5-325 MG tablet Take 1 tablet by mouth every 6 (six) hours as needed for moderate pain. 04/07/19   Garald Balding, MD  icosapent Ethyl (VASCEPA) 1 g capsule Take 2 capsules (2 g total) by mouth 2 (two) times daily. 11/10/19   Troy Sine, MD  metoprolol tartrate (LOPRESSOR) 25 MG tablet TAKE 1.5 TABLET BY MOUTH IN THE MORNING AND 1.5 TABLETS IN THE EVENING. 11/12/21   Troy Sine, MD  montelukast (SINGULAIR) 10 MG tablet Take 10 mg by mouth at bedtime.  11/09/13   [provider]  nicotine (NICOTROL) 10 MG inhaler Inhale 1 Cartridge (1 continuous puffing total) into the lungs as needed for smoking cessation. 10/09/21   [name], MD  omega-3 acid ethyl esters (LOVAZA) 1 g capsule TAKE 1 CAPSULE BY MOUTH 2 (TWO) TIMES DAILY. 04/14/21   Troy Sine, MD  pantoprazole (PROTONIX) 40 MG tablet Take 1 tablet (40 mg total) by mouth daily. 11/10/19   Troy Sine, MD  PROAIR HFA 108 (90 BASE) MCG/ACT inhaler Inhale 2 puffs into the lungs every 4 (four) hours as needed for wheezing or shortness of breath. Reported on 07/08/2015 01/19/13   [provider]  SYNTHROID 100 MCG tablet TAKE 1 TABLET (100 MCG TOTAL) BY MOUTH DAILY BEFORE BREAKFAST. NAME BRAND ONLY 11/04/20   Troy Sine, MD  TRELEGY ELLIPTA 100-62.5-25 MCG/INH AEPB TAKE 1 PUFF BY MOUTH EVERY DAY 09/09/20   [name], MD  Ubrogepant (UBRELVY) 50 MG TABS Take 1 tab at onset of migraine.  May repeat in 2 hrs, if needed.  Max dose: 2 tabs/day. This is a 30 day prescription. 05/29/21   Marcial Pacas, MD      Allergies    Prednisone    Review of Systems   Review of Systems  Respiratory:  Positive for cough and shortness of breath.     Physical Exam Updated Vital  Signs BP 108/63   Pulse 86   Temp 98.3 F (36.8 C) (Oral)   Resp 18   SpO2 94%  Physical Exam Vitals and nursing note reviewed.  Constitutional:      Appearance: Normal appearance. She is ill-appearing.  HENT:     Head: Normocephalic and atraumatic.     Mouth/Throat:     Mouth: Mucous membranes are moist.  Eyes:     General:        Right eye: No discharge.        Left eye: No discharge.     Conjunctiva/sclera: Conjunctivae normal.  Cardiovascular:     Rate and Rhythm: Normal rate and regular rhythm.     Pulses: Normal pulses.     Heart sounds: Normal heart sounds.  Pulmonary:     Effort: Pulmonary effort is normal.     Breath sounds: Wheezing present.  Abdominal:     General: Abdomen is flat.     Palpations: Abdomen is soft.  Skin:    General: Skin is warm and dry.  Neurological:     General: No focal deficit present.     Mental Status: She is alert.  Psychiatric:        Mood and Affect: Mood normal.     ED Results / Procedures / Treatments   Labs (all labs ordered are listed, but only abnormal results are displayed) Labs Reviewed  CBC WITH DIFFERENTIAL/PLATELET - Abnormal; Notable for the following components:      Result Value   RBC 3.86 (*)    All other components within normal limits  RESP PANEL BY RT-PCR (FLU A&B, COVID) [id number]  COMPREHENSIVE METABOLIC PANEL  TROPONIN I (HIGH SENSITIVITY)  TROPONIN I (HIGH SENSITIVITY)    EKG EKG Interpretation  Date/Time:  Sunday February 22 2022 18:43:01 EDT Ventricular Rate:  103 PR Interval:  141 QRS Duration: 116 QT Interval:  360 QTC Calculation: 472 R Axis:   90 Text Interpretation: Sinus tachycardia Incomplete right bundle branch block Confirmed by Campbell Stall ([id number]) on 09/21/1935 7:22:10 PM  Radiology DG Chest Port 1 View  Result Date: 02/22/2022 CLINICAL DATA:  Two 3 days of increasing cough with yellow phlegm. EXAM: PORTABLE CHEST 1 VIEW COMPARISON:  10/24/2018.  CT, 06/06/2021. FINDINGS: Cardiac  silhouette is normal in size and configuration. No mediastinal or hilar masses. Lungs are hyperexpanded. Chronic interstitial thickening. Relative lucency in the upper lobes, more evident on the right, consistent with a seem a. No lung consolidation. No evidence of edema. No pleural effusion or pneumothorax. Skeletal structures are grossly intact. IMPRESSION: 1. No acute cardiopulmonary disease. Electronically Signed   By: Lajean Manes M.D.   On: 02/22/2022 16:31    Procedures .Critical Care  Performed by: Harriet Pho, PA-C Authorized  by: Harriet Pho, PA-C   Critical care provider statement:    Critical care time (minutes):  30   Critical care was necessary to treat or prevent imminent or life-threatening deterioration of the following conditions: respiratory distress.   Critical care was time spent personally by me on the following activities:  Blood draw for specimens, development of treatment plan with patient or surrogate, examination of patient, re-evaluation of patient's condition, pulse oximetry, ordering and review of radiographic studies, ordering and performing treatments and interventions and ordering and review of laboratory studies     Medications Ordered in ED Medications  albuterol (PROVENTIL) (2.5 MG/3ML) 0.083% nebulizer solution 10 mg (0 mg Nebulization Stopped 02/22/22 1655)  ipratropium-albuterol (DUONEB) 0.5-2.5 (3) MG/3ML nebulizer solution 6 mL (6 mLs Nebulization Given 02/22/22 1350)  magnesium sulfate IVPB 2 g 50 mL (0 g Intravenous Stopped 02/22/22 1732)  methylPREDNISolone sodium succinate (SOLU-MEDROL) 125 mg/2 mL injection 125 mg (125 mg Intravenous Given 02/22/22 1616)  benzonatate (TESSALON) capsule 200 mg (200 mg Oral Given 02/22/22 1730)    ED Course/ Medical Decision Making/ A&P                           Medical Decision Making Amount and/or Complexity of Data Reviewed Labs: ordered. Radiology: ordered.  Risk Prescription drug management.   This  patient presents to the ED for concern of shortness of breath, this involves a number of treatment options, and is a complaint that carries with it a high risk of complications and morbidity.  The differential diagnosis includes COPD exacerbation, pulmonary embolism, and ACS.  Consider pulmonary embolism but unlikely given normal heart rate, O2 saturation 100%, and wheezes on exam likely more related to COPD exacerbation.  Also considered ACS though EKG was remarkable.  Primary concern is COPD exacerbation.    Co morbidities: Discussed in HPI   EMR reviewed including pt PMHx, past surgical history and past visits to ER.   See HPI for more details   Lab Tests:   I personally reviewed all laboratory work and imaging. Metabolic panel without any acute abnormality specifically kidney function within normal limits and no significant electrolyte abnormalities. CBC without leukocytosis or significant anemia.   Imaging Studies:  NAD. I personally reviewed all imaging studies and no acute abnormality found. I agree with radiology interpretation.    Cardiac Monitoring:  The patient was maintained on a cardiac monitor.  I personally viewed and interpreted the cardiac monitored which showed an underlying rhythm of: Denies tachycardia EKG non-ischemic   Medicines ordered:  I ordered medication including DuoNebs, IV Solu-Medrol, and IV magnesium for for shortness of breath and tachypnea Reevaluation of the patient after these medicines showed that the patient improved I have reviewed the patients home medicines and have made adjustments as needed   Reevaluation:  After the interventions noted above I re-evaluated patient and found that they have :improved   Problem List / ED Course: Patient presented for shortness of breath.  Likely related to COPD exacerbation in the setting of recent URI.  On exam noted expiratory wheezes bilaterally.  Patient was tachypneic and speaking in short  sentences initially.  Initially given albuterol x2 and later continuous albuterol and IV steroids, IV magnesium.  Upon reevaluation patient stated that she was starting to feel much better and could breathe easier.  No wheezes on auscultation after intervention.  Patient did mention that chest pain felt differently in comparison to previous occurrences of  COPD exacerbation.  This prompted further evaluation with EKG and troponins were which were both unremarkable.  Refilled her home dose of nebulized albuterol and encouraged her to follow-up with her PCP.   Dispostion:  After consideration of the diagnostic results and the patients response to treatment, I feel that the patent would benefit from close follow-up with PCP regarding COPD exacerbation encounter today.          Final Clinical Impression(s) / ED Diagnoses Final diagnoses:  COPD exacerbation ([place])    Rx / DC Orders ED Discharge Orders          Ordered    albuterol (PROVENTIL) (2.5 MG/3ML) 0.083% nebulizer solution  Every 6 hours PRN        02/22/22 2105              Harriet Pho, PA-C 59/74/16 [id number]    Campbell Stall P, DO 36/46/80 [id number]

## 2022-02-22 NOTE — ED Triage Notes (Signed)
She tells [place] she has emphysema. She c/o 2-3 days hx of increased cough (she states she coughs every day) with yellow phlegm. She tells [place] she is our of her home albuterol solution for her nebulizer. She tells [place] she did take her Trilogy inhaler this morning.

## 2022-02-22 NOTE — Discharge Instructions (Addendum)
Diagnosed with COPD exacerbation.  Clinically improved after continuous albuterol, IV steroids, IV magnesium.  EKG and troponins also normal making heart related cause of your chest pain unlikely.  Nevertheless, strongly advised to follow-up with PCP in the next couple days given the acute nature of your respiratory distress in the setting of COPD.  If your shortness of breath worsens, chest pain worsens, new bloody sputum, new fever please return to the emergency department.  Also refilled your nebulized albuterol solution.

## 2022-02-22 NOTE — ED Notes (Signed)
RT educated pt on the proper use of MDI w/spacer. RT also discussed smoking cessation w/pt and the importance of quitting to slow the progression of her COPD. Pt verbalizes understanding of process and administration instruction.

## 2022-02-22 NOTE — ED Triage Notes (Signed)
As I write this, pt. Is being given a Duoneb h.h.n. treatment by our R.T. Erline Levine.

## 2022-02-26 ENCOUNTER — Encounter (HOSPITAL_COMMUNITY): Payer: Self-pay

## 2022-02-26 ENCOUNTER — Other Ambulatory Visit: Payer: Self-pay

## 2022-02-26 ENCOUNTER — Emergency Department (HOSPITAL_BASED_OUTPATIENT_CLINIC_OR_DEPARTMENT_OTHER): Payer: BC Managed Care – PPO | Admitting: Radiology

## 2022-02-26 ENCOUNTER — Encounter (HOSPITAL_BASED_OUTPATIENT_CLINIC_OR_DEPARTMENT_OTHER): Payer: Self-pay | Admitting: Emergency Medicine

## 2022-02-26 ENCOUNTER — Observation Stay (HOSPITAL_BASED_OUTPATIENT_CLINIC_OR_DEPARTMENT_OTHER)
Admission: EM | Admit: 2022-02-26 | Discharge: 2022-02-28 | Disposition: A | Discharge status: Home / Self Care | Payer: BC Managed Care – PPO | Attending: Internal Medicine | Admitting: Internal Medicine

## 2022-02-26 DIAGNOSIS — J9601 Acute respiratory failure with hypoxia: Secondary | ICD-10-CM

## 2022-02-26 DIAGNOSIS — I2510 Atherosclerotic heart disease of native coronary artery without angina pectoris: Secondary | ICD-10-CM | POA: Diagnosis present

## 2022-02-26 DIAGNOSIS — J441 Chronic obstructive pulmonary disease with (acute) exacerbation: Principal | ICD-10-CM | POA: Diagnosis present

## 2022-02-26 DIAGNOSIS — Z803 Family history of malignant neoplasm of breast: Secondary | ICD-10-CM

## 2022-02-26 DIAGNOSIS — Z79899 Other long term (current) drug therapy: Secondary | ICD-10-CM | POA: Insufficient documentation

## 2022-02-26 DIAGNOSIS — J439 Emphysema, unspecified: Secondary | ICD-10-CM | POA: Diagnosis not present

## 2022-02-26 DIAGNOSIS — F419 Anxiety disorder, unspecified: Secondary | ICD-10-CM | POA: Diagnosis present

## 2022-02-26 DIAGNOSIS — Z9882 Breast implant status: Secondary | ICD-10-CM

## 2022-02-26 DIAGNOSIS — Z800 Family history of malignant neoplasm of digestive organs: Secondary | ICD-10-CM

## 2022-02-26 DIAGNOSIS — Z808 Family history of malignant neoplasm of other organs or systems: Secondary | ICD-10-CM

## 2022-02-26 DIAGNOSIS — Z888 Allergy status to other drugs, medicaments and biological substances status: Secondary | ICD-10-CM

## 2022-02-26 DIAGNOSIS — E039 Hypothyroidism, unspecified: Secondary | ICD-10-CM | POA: Insufficient documentation

## 2022-02-26 DIAGNOSIS — Z853 Personal history of malignant neoplasm of breast: Secondary | ICD-10-CM

## 2022-02-26 DIAGNOSIS — Z9221 Personal history of antineoplastic chemotherapy: Secondary | ICD-10-CM

## 2022-02-26 DIAGNOSIS — Z7982 Long term (current) use of aspirin: Secondary | ICD-10-CM | POA: Diagnosis not present

## 2022-02-26 DIAGNOSIS — Z90722 Acquired absence of ovaries, bilateral: Secondary | ICD-10-CM

## 2022-02-26 DIAGNOSIS — Z8041 Family history of malignant neoplasm of ovary: Secondary | ICD-10-CM

## 2022-02-26 DIAGNOSIS — Z9013 Acquired absence of bilateral breasts and nipples: Secondary | ICD-10-CM

## 2022-02-26 DIAGNOSIS — Z90710 Acquired absence of both cervix and uterus: Secondary | ICD-10-CM

## 2022-02-26 DIAGNOSIS — N183 Chronic kidney disease, stage 3 unspecified: Secondary | ICD-10-CM | POA: Diagnosis present

## 2022-02-26 DIAGNOSIS — Z8249 Family history of ischemic heart disease and other diseases of the circulatory system: Secondary | ICD-10-CM

## 2022-02-26 DIAGNOSIS — C50412 Malignant neoplasm of upper-outer quadrant of left female breast: Secondary | ICD-10-CM

## 2022-02-26 DIAGNOSIS — F17210 Nicotine dependence, cigarettes, uncomplicated: Secondary | ICD-10-CM | POA: Diagnosis present

## 2022-02-26 DIAGNOSIS — Z171 Estrogen receptor negative status [ER-]: Secondary | ICD-10-CM

## 2022-02-26 DIAGNOSIS — K219 Gastro-esophageal reflux disease without esophagitis: Secondary | ICD-10-CM | POA: Diagnosis present

## 2022-02-26 DIAGNOSIS — Z79890 Hormone replacement therapy (postmenopausal): Secondary | ICD-10-CM

## 2022-02-26 LAB — CBC WITH DIFFERENTIAL/PLATELET
Abs Immature Granulocytes: 0.03 10*3/uL (ref 0.00–0.07)
Basophils Absolute: 0 10*3/uL (ref 0.0–0.1)
Basophils Relative: 0 %
Eosinophils Absolute: 0 10*3/uL (ref 0.0–0.5)
Eosinophils Relative: 0 %
HCT: 34.2 % — ABNORMAL LOW (ref 36.0–46.0)
Hemoglobin: 11.8 g/dL — ABNORMAL LOW (ref 12.0–15.0)
Immature Granulocytes: 0 %
Lymphocytes Relative: 15 %
Lymphs Abs: 1.2 10*3/uL (ref 0.7–4.0)
MCH: 31.7 pg (ref 26.0–34.0)
MCHC: 34.5 g/dL (ref 30.0–36.0)
MCV: 91.9 fL (ref 80.0–100.0)
Monocytes Absolute: 0.6 10*3/uL (ref 0.1–1.0)
Monocytes Relative: 7 %
Neutro Abs: 6.5 10*3/uL (ref 1.7–7.7)
Neutrophils Relative %: 78 %
Platelets: 203 10*3/uL (ref 150–400)
RBC: 3.72 MIL/uL — ABNORMAL LOW (ref 3.87–5.11)
RDW: 11.8 % (ref 11.5–15.5)
WBC: 8.4 10*3/uL (ref 4.0–10.5)
nRBC: 0 % (ref 0.0–0.2)

## 2022-02-26 LAB — BASIC METABOLIC PANEL
Anion gap: 11 (ref 5–15)
BUN: 12 mg/dL (ref 6–20)
CO2: 23 mmol/L (ref 22–32)
Calcium: 9.1 mg/dL (ref 8.9–10.3)
Chloride: 103 mmol/L (ref 98–111)
Creatinine, Ser: 0.77 mg/dL (ref 0.44–1.00)
GFR, Estimated: >60 mL/min (ref >60)
Glucose, Bld: 91 mg/dL (ref 70–99)
Potassium: 3.7 mmol/L (ref 3.5–5.1)
Sodium: 137 mmol/L (ref 135–145)

## 2022-02-26 LAB — I-STAT VENOUS BLOOD GAS, ED
Acid-base deficit: 1 mmol/L (ref 0.0–2.0)
Bicarbonate: 24.2 mmol/L (ref 20.0–28.0)
Calcium, Ion: 1.22 mmol/L (ref 1.15–1.40)
HCT: 34 % — ABNORMAL LOW (ref 36.0–46.0)
Hemoglobin: 11.6 g/dL — ABNORMAL LOW (ref 12.0–15.0)
O2 Saturation: 45 %
Potassium: 3.8 mmol/L (ref 3.5–5.1)
Sodium: 137 mmol/L (ref 135–145)
TCO2: 26 mmol/L (ref 22–32)
pCO2, Ven: 43.1 mmHg — ABNORMAL LOW (ref 44–60)
pH, Ven: 7.358 (ref 7.25–7.43)
pO2, Ven: 26 mmHg — CL (ref 32–45)

## 2022-02-26 MED ORDER — LORATADINE 10 MG PO TABS
10.0000 mg | ORAL_TABLET | Freq: Every day | ORAL | Status: DC
  Administered 2022-02-27 – 2022-02-28 (×2): 10 mg via ORAL
  Filled 2022-02-26 (×2): qty 1

## 2022-02-26 MED ORDER — ICOSAPENT ETHYL 1 G PO CAPS
2.0000 g | ORAL_CAPSULE | Freq: Two times a day (BID) | ORAL | Status: DC
  Administered 2022-02-26 – 2022-02-28 (×4): 2 g via ORAL
  Filled 2022-02-26 (×4): qty 2

## 2022-02-26 MED ORDER — METHYLPREDNISOLONE SODIUM SUCC 125 MG IJ SOLR
125.0000 mg | Freq: Once | INTRAMUSCULAR | Status: AC
  Administered 2022-02-26: 125 mg via INTRAVENOUS
  Filled 2022-02-26: qty 2

## 2022-02-26 MED ORDER — ALBUTEROL (5 MG/ML) CONTINUOUS INHALATION SOLN: 10.0000 mg/h | INHALATION_SOLUTION | RESPIRATORY_TRACT | Status: DC

## 2022-02-26 MED ORDER — SODIUM CHLORIDE 0.9 % IV SOLN
1.0000 g | INTRAVENOUS | Status: DC
  Administered 2022-02-26: 1 g via INTRAVENOUS
  Filled 2022-02-26: qty 10

## 2022-02-26 MED ORDER — ENOXAPARIN SODIUM 40 MG/0.4ML IJ SOSY
40.0000 mg | PREFILLED_SYRINGE | INTRAMUSCULAR | Status: DC
  Administered 2022-02-26 – 2022-02-27 (×2): 40 mg via SUBCUTANEOUS
  Filled 2022-02-26 (×2): qty 0.4

## 2022-02-26 MED ORDER — HYDROCODONE-ACETAMINOPHEN 5-325 MG PO TABS
1.0000 | ORAL_TABLET | Freq: Four times a day (QID) | ORAL | Status: DC | PRN
  Administered 2022-02-27 (×2): 1 via ORAL
  Filled 2022-02-26 (×2): qty 1

## 2022-02-26 MED ORDER — NICOTINE 21 MG/24HR TD PT24
21.0000 mg | MEDICATED_PATCH | Freq: Every day | TRANSDERMAL | Status: DC
  Administered 2022-02-27 – 2022-02-28 (×2): 21 mg via TRANSDERMAL
  Filled 2022-02-26 (×2): qty 1

## 2022-02-26 MED ORDER — ALBUTEROL SULFATE (2.5 MG/3ML) 0.083% IN NEBU: 2.5000 mg | INHALATION_SOLUTION | RESPIRATORY_TRACT | Status: DC | PRN

## 2022-02-26 MED ORDER — MAGNESIUM SULFATE 2 GM/50ML IV SOLN
2.0000 g | Freq: Once | INTRAVENOUS | Status: AC
  Administered 2022-02-26: 2 g via INTRAVENOUS
  Filled 2022-02-26: qty 50

## 2022-02-26 MED ORDER — ASPIRIN 81 MG PO TBEC
81.0000 mg | DELAYED_RELEASE_TABLET | Freq: Every day | ORAL | Status: DC
  Administered 2022-02-27 – 2022-02-28 (×2): 81 mg via ORAL
  Filled 2022-02-26 (×2): qty 1

## 2022-02-26 MED ORDER — IPRATROPIUM-ALBUTEROL 0.5-2.5 (3) MG/3ML IN SOLN
3.0000 mL | Freq: Once | RESPIRATORY_TRACT | Status: AC
Start: 2022-02-26 — End: 2022-02-26
  Administered 2022-02-26: 3 mL via RESPIRATORY_TRACT
  Filled 2022-02-26: qty 3

## 2022-02-26 MED ORDER — ALPRAZOLAM 0.25 MG PO TABS
0.2500 mg | ORAL_TABLET | Freq: Every day | ORAL | Status: DC
  Administered 2022-02-27 – 2022-02-28 (×2): 0.25 mg via ORAL
  Filled 2022-02-26 (×2): qty 1

## 2022-02-26 MED ORDER — IPRATROPIUM-ALBUTEROL 0.5-2.5 (3) MG/3ML IN SOLN: 3.0000 mL | Freq: Four times a day (QID) | RESPIRATORY_TRACT | Status: DC

## 2022-02-26 MED ORDER — HYDROCOD POLI-CHLORPHE POLI ER 10-8 MG/5ML PO SUER
5.0000 mL | Freq: Every evening | ORAL | Status: DC | PRN
  Administered 2022-02-26 – 2022-02-27 (×2): 5 mL via ORAL
  Filled 2022-02-26 (×2): qty 5

## 2022-02-26 MED ORDER — ALBUTEROL SULFATE (2.5 MG/3ML) 0.083% IN NEBU
7.5000 mg | INHALATION_SOLUTION | Freq: Once | RESPIRATORY_TRACT | Status: AC
Start: 2022-02-26 — End: 2022-02-26
  Administered 2022-02-26: 7.5 mg via RESPIRATORY_TRACT
  Filled 2022-02-26: qty 9

## 2022-02-26 MED ORDER — MONTELUKAST SODIUM 10 MG PO TABS
10.0000 mg | ORAL_TABLET | Freq: Every day | ORAL | Status: DC
  Administered 2022-02-26 – 2022-02-27 (×2): 10 mg via ORAL
  Filled 2022-02-26 (×2): qty 1

## 2022-02-26 MED ORDER — PANTOPRAZOLE SODIUM 40 MG PO TBEC
40.0000 mg | DELAYED_RELEASE_TABLET | Freq: Every day | ORAL | Status: DC
  Administered 2022-02-27 – 2022-02-28 (×2): 40 mg via ORAL
  Filled 2022-02-26 (×2): qty 1

## 2022-02-26 MED ORDER — LEVOTHYROXINE SODIUM 75 MCG PO TABS
75.0000 ug | ORAL_TABLET | Freq: Every day | ORAL | Status: DC
  Administered 2022-02-27 – 2022-02-28 (×2): 75 ug via ORAL
  Filled 2022-02-26 (×2): qty 1

## 2022-02-26 MED ORDER — FLUTICASONE FUROATE-VILANTEROL 100-25 MCG/ACT IN AEPB
1.0000 | INHALATION_SPRAY | Freq: Every day | RESPIRATORY_TRACT | Status: DC
  Administered 2022-02-27 – 2022-02-28 (×2): 1 via RESPIRATORY_TRACT
  Filled 2022-02-26: qty 28

## 2022-02-26 MED ORDER — UMECLIDINIUM BROMIDE 62.5 MCG/ACT IN AEPB
1.0000 | INHALATION_SPRAY | Freq: Every day | RESPIRATORY_TRACT | Status: DC
  Administered 2022-02-27 – 2022-02-28 (×2): 1 via RESPIRATORY_TRACT
  Filled 2022-02-26: qty 7

## 2022-02-26 MED ORDER — OMEGA-3-ACID ETHYL ESTERS 1 G PO CAPS
1.0000 g | ORAL_CAPSULE | Freq: Two times a day (BID) | ORAL | Status: DC
  Administered 2022-02-26 – 2022-02-28 (×4): 1 g via ORAL
  Filled 2022-02-26 (×4): qty 1

## 2022-02-26 MED ORDER — METOPROLOL TARTRATE 25 MG PO TABS
37.5000 mg | ORAL_TABLET | Freq: Two times a day (BID) | ORAL | Status: DC
  Administered 2022-02-26 – 2022-02-28 (×4): 37.5 mg via ORAL
  Filled 2022-02-26 (×4): qty 1

## 2022-02-26 MED ORDER — METHYLPREDNISOLONE SODIUM SUCC 40 MG IJ SOLR
40.0000 mg | Freq: Every day | INTRAMUSCULAR | Status: DC
  Administered 2022-02-27: 40 mg via INTRAVENOUS
  Filled 2022-02-26: qty 1

## 2022-02-26 NOTE — ED Notes (Signed)
Put back on 2L

## 2022-02-26 NOTE — Assessment & Plan Note (Signed)
Continue aspirin 

## 2022-02-26 NOTE — H&P (Signed)
History and Physical    Patient: Stacy Fernandez GMW:[id number] DOB: [birth year] DOA: 02/26/2022 DOS: the patient was seen and examined on 02/26/2022 PCP: Jettie Booze, NP  Patient coming from: [place] ED  Chief Complaint:  Chief Complaint  Patient presents with   COPD   HPI: Stacy Fernandez is a 61 y.o. female with medical history significant of breast cancer s/p bilateral mastectomy and chemotherapy in remission, chronic migraines, CAD, hypothyroidism, COPD with ongoing tobacco use who presents with worsening shortness of breath.   Pt started to have URI symptoms of cough and chest congestion about a week ago. Has been taking Trelegy and rescue albuterol at home. Also noted 2 days of temperature around 100.1. Seen at outside ED on 02/22/22 for COPD exacerbation and felt improved after nebulizer, steroid and magnesium. Sent home with refills of nebulizer which she used on a scheduled basis but continued to feel worsening shortness of breath. Cough productive of thick sputum. Does not use oxygent at home. Continues to smoke 1/2 pack daily.   In the ED, she was afebrile but tachycardic and tachypneic with hypoxia down to 88% initially requiring 7 L and has been able to wean down to 2 L via nasal cannula. Had no leukocytosis, hemoglobin 11.6.  CMP unremarkable.  Chest x-ray showing stable emphysema.  Given DuoNeb, IV magnesium and IV Solu-Medrol in the ED and transfer was requested for continued management of COPD exacerbation.  Review of Systems: As mentioned in the history of present illness. All other systems reviewed and are negative. 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)--  s/p  bilateral mastectomy w/ reconstruction and chemotherapy (05-08-2014 to 04-30-2015)   Chemotherapy-induced peripheral  neuropathy (HCC)    FEET   Chronic migraine    caused by brain aneurysm--  BOTOX TX'S  by dr Krista Blue   CKD (chronic kidney disease), stage III ([place])    COPD with emphysema ([place]) pulmologist--  dr Elsworth Soho   advanded emphysema per CT    History of chemotherapy    left breast ca-- 05-09-2015   to 04-30-2015   History of gastroesophageal reflux (GERD)    no problems since Nissen fundoplication   Hypothyroidism    Rectovaginal fistula    Skin changes related to chemotherapy    ARMS   Supraclinoid carotid artery aneurysm, small neurologist-  dr willis/  dr Krista Blue   left internal - 2 mm;  aneurysm  vs.  infundibulum--  residual chronic migraine   Wears dentures    upper   Wears glasses    Past Surgical History:  Procedure Laterality Date   APPENDECTOMY  1992   BREAST RECONSTRUCTION WITH PLACEMENT OF TISSUE EXPANDER AND FLEX HD (ACELLULAR HYDRATED DERMIS) Bilateral 09/25/2014   Procedure: PLACEMENT OF BILATERAL TISSUE EXPANDER AND  ACELLULAR DERMIS FOR BREAST RECONSTRUCTION ;  Surgeon: [name], MD;  Location: [place];  Service: Plastics;  Laterality: Bilateral;   CHOLECYSTECTOMY  1999   ESOPHAGOGASTRODUODENOSCOPY (EGD) WITH PROPOFOL  01/18/2013   LAPAROSCOPIC NISSEN FUNDOPLICATION  [id number]   LEFT HEART CATH AND CORONARY ANGIOGRAPHY N/A 04/28/2019   Procedure: LEFT HEART CATH AND CORONARY ANGIOGRAPHY;  Surgeon: Troy Sine, MD;  Location: [place] CV LAB;  Service: Cardiovascular;  Laterality: N/A;   LEFT HEART  CATHETERIZATION WITH CORONARY ANGIOGRAM N/A 12/26/2012   Procedure: LEFT HEART CATHETERIZATION WITH CORONARY ANGIOGRAM;  Surgeon: Lorretta Harp, MD;  Location: [facility] CATH LAB;  Service: Cardiovascular;  Laterality: N/A;   Nonobstructive CAD/  40-50% ostial D1/  normal LVF, ef 60%   LIPOSUCTION WITH LIPOFILLING Bilateral 12/18/2014   Procedure: LIPOFILLING TO BILATERAL CHEST;  Surgeon: [name], MD;  Location: [place];  Service: Plastics;   Laterality: Bilateral;   PORT-A-CATH REMOVAL Right 06/28/2015   Procedure: REMOVAL PORT-A-CATH;  Surgeon: Leighton Ruff, MD;  Location: [facility];  Service: General;  Laterality: Right;   PORTACATH PLACEMENT N/A 04/02/2014   Procedure: INSERTION PORT-A-CATH;  Surgeon: [name], MD;  Location: [place];  Service: General;  Laterality: N/A;   REMOVAL OF BILATERAL TISSUE EXPANDERS WITH PLACEMENT OF BILATERAL BREAST IMPLANTS Bilateral 12/18/2014   Procedure: REMOVAL OF BILATERAL TISSUE EXPANDERS,PLACEMENT SILICONE IMPLANTS ;  Surgeon: [name], MD;  Location: [place];  Service: Plastics;  Laterality: Bilateral;   SIMPLE MASTECTOMY WITH AXILLARY SENTINEL NODE BIOPSY Bilateral 04/02/2014   Procedure: LEFT TOTAL MASTECTOMY WITH LEFT AXILLARY SENTINEL NODE BIOPSY, RIGHT PROPHYLACTIC MASTETCTOMY;  Surgeon: [name], MD;  Location: [place];  Service: General;  Laterality: Bilateral;   TOTAL ABDOMINAL HYSTERECTOMY W/ BILATERAL SALPINGOOPHORECTOMY  1996   TRANSTHORACIC ECHOCARDIOGRAM  02-04-2015   grade 1 diastolic dysfunction, ef 12-19%/  trivial TR   VESICO-VAGINAL FISTULA REPAIR N/A 07/17/2015   Procedure: REPAIR OF RECTOVAGINAL FISTULA;  Surgeon: [name], MD;  Location: [facility];  Service: Gynecology;  Laterality: N/A;   VIDEO BRONCHOSCOPY Bilateral 01/16/2013   Procedure: VIDEO BRONCHOSCOPY WITHOUT FLUORO;  Surgeon: [name], MD;  Location: [facility];  Service: Cardiopulmonary;  Laterality: Bilateral;   Social History:  reports that she has been smoking cigarettes and e-cigarettes. She has a 30.00 pack-year smoking history. She has never used smokeless tobacco. She reports that she does not drink alcohol and does not use drugs.  Allergies  Allergen Reactions   Prednisone Palpitations    Patient reports steroids cause tachycardia - DENIES that she has had airway swelling, difficulty breathing, or hives on steroids, and has tolerated steroid  courses many times in the past.    Family History  Problem Relation Age of Onset   Heart failure Mother    Colon cancer Father 81       stomach cancer also in 59s   Throat cancer Brother 23       smoker   Heart attack Maternal Grandmother    Colon cancer Paternal Grandmother 81   Cancer Paternal Grandfather        kidney and bladder   Ovarian cancer Sister 62       ovarian cancer at 57, colorectal cancer at 75   Throat cancer Brother 76       throat cancer, smoker   Breast cancer Paternal Aunt 24   Ovarian cancer Other 32       niece with ovarian cancer    Prior to Admission medications   Medication Sig Start Date End Date Taking? Authorizing Provider  albuterol (PROVENTIL) (2.5 MG/3ML) 0.083% nebulizer solution Take 3 mLs (2.5 mg total) by nebulization every 6 (six) hours as needed for wheezing or shortness of breath. 02/22/22  Yes Harriet Pho, PA-C  ALPRAZolam Duanne Moron) 0.25 MG tablet Take 0.25 mg by mouth 2 (two) times daily. 08/01/21  Yes [provider]  aspirin EC 81 MG tablet Take 1 tablet (  81 mg total) by mouth daily. Patient taking differently: Take 81 mg by mouth daily. Chewable 01/05/19  Yes Troy Sine, MD  Erenumab-aooe (AIMOVIG) 70 MG/ML SOAJ Inject 70 mg into the skin every 30 (thirty) days. 05/29/21  Yes Marcial Pacas, MD  Fluticasone-Umeclidin-Vilant (TRELEGY ELLIPTA) 100-62.5-25 MCG/ACT AEPB Inhale 100 mcg into the lungs daily. 10/09/21  Yes [name], MD  HYDROcodone-acetaminophen (NORCO/VICODIN) 5-325 MG tablet Take 1 tablet by mouth every 6 (six) hours as needed for moderate pain. 04/07/19  Yes Garald Balding, MD  icosapent Ethyl (VASCEPA) 1 g capsule Take 2 capsules (2 g total) by mouth 2 (two) times daily. 11/10/19  Yes Troy Sine, MD  levothyroxine (SYNTHROID) 75 MCG tablet Take 75 mcg by mouth daily. 02/12/22  Yes [provider]  loratadine (CLARITIN) 10 MG tablet Take 10 mg by mouth daily. 02/12/22  Yes [provider]   metoprolol tartrate (LOPRESSOR) 25 MG tablet TAKE 1.5 TABLET BY MOUTH IN THE MORNING AND 1.5 TABLETS IN THE EVENING. 11/12/21  Yes Troy Sine, MD  montelukast (SINGULAIR) 10 MG tablet Take 10 mg by mouth at bedtime.  11/09/13  Yes [provider]  omega-3 acid ethyl esters (LOVAZA) 1 g capsule TAKE 1 CAPSULE BY MOUTH 2 (TWO) TIMES DAILY. 04/14/21  Yes Troy Sine, MD  pantoprazole (PROTONIX) 40 MG tablet Take 1 tablet (40 mg total) by mouth daily. 11/10/19  Yes Troy Sine, MD  PROAIR HFA 108 [phone] BASE) MCG/ACT inhaler Inhale 2 puffs into the lungs every 4 (four) hours as needed for wheezing or shortness of breath. Reported on 07/08/2015 01/19/13  Yes [provider]  TRELEGY ELLIPTA 100-62.5-25 MCG/INH AEPB TAKE 1 PUFF BY MOUTH EVERY DAY 09/09/20  Yes [name], MD  Ubrogepant (UBRELVY) 50 MG TABS Take 1 tab at onset of migraine.  May repeat in 2 hrs, if needed.  Max dose: 2 tabs/day. This is a 30 day prescription. 05/29/21  Yes Marcial Pacas, MD  acetaminophen (TYLENOL) 325 MG tablet Take 2 tablets (650 mg total) by mouth every 6 (six) hours as needed for mild pain or headache (fever >/= 101). Patient not taking: Reported on 02/26/2022 10/29/18   Georgette Shell, MD  nicotine (NICOTROL) 10 MG inhaler Inhale 1 Cartridge (1 continuous puffing total) into the lungs as needed for smoking cessation. Patient not taking: Reported on 02/26/2022 10/09/21   [name], MD  SYNTHROID 100 MCG tablet TAKE 1 TABLET (100 MCG TOTAL) BY MOUTH DAILY BEFORE BREAKFAST. NAME BRAND ONLY Patient not taking: Reported on 02/26/2022 11/04/20   Troy Sine, MD    Physical Exam: Vitals:   02/26/22 1530 02/26/22 1700 02/26/22 1730 02/26/22 2019  BP: 108/71  110/65 (!) 105/58  Pulse: 100  95 95  Resp: '17  18 18  '$ Temp:  97.9 F (36.6 C)  98 F (36.7 C)  TempSrc:  Oral  Oral  SpO2: (!) 89%  93% 94%  Weight:      Height:       Constitutional: NAD, calm, comfortable, mildly  ill-appearing female laying flat in bed with frequent coughing Eyes: lids and conjunctivae normal ENMT: Mucous membranes are moist. Neck: normal, supple Respiratory: Diminished breath sounds throughout with expiratory wheezing heard during coughing normal respiratory effort. No accessory muscle use.  On 2 L via nasal cannula. Cardiovascular: Regular rate and rhythm, no murmurs / rubs / gallops. No extremity edema.  Abdomen: no tenderness,  Bowel sounds positive.  Musculoskeletal: no clubbing / cyanosis. No joint deformity upper and lower extremities. Good ROM, no contractures. Normal muscle tone.  Skin: no rashes, lesions, ulcers.  Neurologic: CN 2-12 grossly intact.  Strength 5/5 in all 4.  Psychiatric: Normal judgment and insight. Alert and oriented x 3. Normal mood. Data Reviewed:  See HPI  Assessment and Plan: * Acute respiratory failure with hypoxia ([place]) Secondary to acute COPD exacerbation with ongoing tobacco use -scheduled q6hr duoneb for tonight with PRN albuterol neb q4hr -continue IV solu-medrol -add IV Rocephin  -wean O2 as tolerated with goal 88-92% -encourage tobacco cessation. Provided nicotine patch.  Malignant neoplasm of upper-outer quadrant of left breast in female, estrogen receptor negative ([place]) S/p chemotherapy and bilateral mastectomy.  Currently in remission and followed by PCP  CAD (coronary artery disease) Continue aspirin  Hypothyroidism Continue levothyroxine      Advance Care Planning:   Code Status: Full Code   Consults: None  Family Communication: Discussed with husband at bedside  Severity of Illness: The appropriate patient status for this patient is OBSERVATION. Observation status is judged to be reasonable and necessary in order to provide the required intensity of service to ensure the patient's safety. The patient's presenting symptoms, physical exam findings, and initial radiographic and laboratory data in the context of their medical  condition is felt to place them at decreased risk for further clinical deterioration. Furthermore, it is anticipated that the patient will be medically stable for discharge from the hospital within 2 midnights of admission.   Author: Orene Desanctis, DO 02/26/2022 10:49 PM  For on call review [email or web address].

## 2022-02-26 NOTE — ED Notes (Addendum)
Patient arrives for complaints of shortness of breath. Patient was seen on 02/22/2022 and was given steroids and breathing treatments. Patient states she was not given any steroids for home at that time and that steroids typically help her. Patient has a history of COPD and continues to smoke about half a pack a day, but has not been able to smoke in a few days. Patient BBS are diminished with a faint expiratory wheeze. Patient is dyspneic with exertion and has a frequent, productive cough (per patient). Patient endorses using neb/MDI every four hours and using her maintenance inhaler as prescribed (Trelegy). RT assessment completed in triage.

## 2022-02-26 NOTE — ED Notes (Signed)
[facility] RT given report.  

## 2022-02-26 NOTE — ED Notes (Signed)
Patient placed on CAT. CAT running through RA and O2 sats 86-88% at rest. Patient states her O2 sat at home is usually around 92-95% on RA. CAT changed to oxygen due to O2 sats 86-88% on RA; O2 sats currently 94-97% on CAT @ 7 L. RN, Joe, aware.

## 2022-02-26 NOTE — Assessment & Plan Note (Addendum)
Secondary to acute COPD exacerbation with ongoing tobacco use -scheduled q6hr duoneb for tonight with PRN albuterol neb q4hr -continue IV solu-medrol -add IV Rocephin  -wean O2 as tolerated with goal 88-92% -encourage tobacco cessation. Provided nicotine patch.

## 2022-02-26 NOTE — ED Triage Notes (Signed)
Pt arrives to ED with c/o COPD exacerbation. Pt was seen on 8/6 for same but reports it has worsened. Pt reports her cough has worsened as well.

## 2022-02-26 NOTE — Assessment & Plan Note (Signed)
S/p chemotherapy and bilateral mastectomy.  Currently in remission and followed by PCP

## 2022-02-26 NOTE — Plan of Care (Signed)
  Problem: Clinical Measurements: Goal: Ability to maintain clinical measurements within normal limits will improve Outcome: Progressing   

## 2022-02-26 NOTE — ED Provider Notes (Signed)
[place] EMERGENCY DEPT Provider Note   CSN: [id number] Arrival date & time: 02/26/22  4627     History  Chief Complaint  Patient presents with   COPD    Stacy Fernandez is a 61 y.o. female with history of COPD, daily smoker, presenting from home with concern of shortness of breath.  Patient was seen in the ER 4 days ago for the same symptoms, treated with DuoNebs, was not given steroids at the time.  She reports that she continues to have coughing and wheezing.  She has not smoked all week due to this.  She typically has a baseline oxygen saturation of 90 to 92%.  HPI     Home Medications Prior to Admission medications   Medication Sig Start Date End Date Taking? Authorizing Provider  albuterol (PROVENTIL) (2.5 MG/3ML) 0.083% nebulizer solution Take 3 mLs (2.5 mg total) by nebulization every 6 (six) hours as needed for wheezing or shortness of breath. 02/22/22  Yes Harriet Pho, PA-C  ALPRAZolam Duanne Moron) 0.25 MG tablet Take 0.25 mg by mouth 2 (two) times daily. 08/01/21  Yes [provider]  aspirin EC 81 MG tablet Take 1 tablet (81 mg total) by mouth daily. Patient taking differently: Take 81 mg by mouth daily. Chewable 01/05/19  Yes Troy Sine, MD  Erenumab-aooe (AIMOVIG) 70 MG/ML SOAJ Inject 70 mg into the skin every 30 (thirty) days. 05/29/21  Yes Marcial Pacas, MD  Fluticasone-Umeclidin-Vilant (TRELEGY ELLIPTA) 100-62.5-25 MCG/ACT AEPB Inhale 100 mcg into the lungs daily. 10/09/21  Yes [name], MD  HYDROcodone-acetaminophen (NORCO/VICODIN) 5-325 MG tablet Take 1 tablet by mouth every 6 (six) hours as needed for moderate pain. 04/07/19  Yes Garald Balding, MD  icosapent Ethyl (VASCEPA) 1 g capsule Take 2 capsules (2 g total) by mouth 2 (two) times daily. 11/10/19  Yes Troy Sine, MD  levothyroxine (SYNTHROID) 75 MCG tablet Take 75 mcg by mouth daily. 02/12/22  Yes [provider]  loratadine (CLARITIN) 10 MG tablet Take 10 mg by  mouth daily. 02/12/22  Yes [provider]  metoprolol tartrate (LOPRESSOR) 25 MG tablet TAKE 1.5 TABLET BY MOUTH IN THE MORNING AND 1.5 TABLETS IN THE EVENING. 11/12/21  Yes Troy Sine, MD  montelukast (SINGULAIR) 10 MG tablet Take 10 mg by mouth at bedtime.  11/09/13  Yes [provider]  omega-3 acid ethyl esters (LOVAZA) 1 g capsule TAKE 1 CAPSULE BY MOUTH 2 (TWO) TIMES DAILY. 04/14/21  Yes Troy Sine, MD  pantoprazole (PROTONIX) 40 MG tablet Take 1 tablet (40 mg total) by mouth daily. 11/10/19  Yes Troy Sine, MD  PROAIR HFA 108 [phone] BASE) MCG/ACT inhaler Inhale 2 puffs into the lungs every 4 (four) hours as needed for wheezing or shortness of breath. Reported on 07/08/2015 01/19/13  Yes [provider]  TRELEGY ELLIPTA 100-62.5-25 MCG/INH AEPB TAKE 1 PUFF BY MOUTH EVERY DAY 09/09/20  Yes [name], MD  Ubrogepant (UBRELVY) 50 MG TABS Take 1 tab at onset of migraine.  May repeat in 2 hrs, if needed.  Max dose: 2 tabs/day. This is a 30 day prescription. 05/29/21  Yes Marcial Pacas, MD  acetaminophen (TYLENOL) 325 MG tablet Take 2 tablets (650 mg total) by mouth every 6 (six) hours as needed for mild pain or headache (fever >/= 101). Patient not taking: Reported on 02/26/2022 10/29/18   Georgette Shell, MD  nicotine (NICOTROL) 10 MG inhaler Inhale 1 Cartridge (1 continuous puffing  total) into the lungs as needed for smoking cessation. Patient not taking: Reported on 02/26/2022 10/09/21   [name], MD  SYNTHROID 100 MCG tablet TAKE 1 TABLET (100 MCG TOTAL) BY MOUTH DAILY BEFORE BREAKFAST. NAME BRAND ONLY Patient not taking: Reported on 02/26/2022 11/04/20   Troy Sine, MD      Allergies    Prednisone    Review of Systems   Review of Systems  Physical Exam Updated Vital Signs BP (!) 92/57   Pulse (!) 102   Temp 98 F (36.7 C)   Resp (!) 22   Ht '5\' 6"'$  (1.676 m)   Wt 64.9 kg   SpO2 93%   BMI 23.08 kg/m  Physical Exam Constitutional:       General: She is not in acute distress. HENT:     Head: Normocephalic and atraumatic.  Eyes:     Conjunctiva/sclera: Conjunctivae normal.     Pupils: Pupils are equal, round, and reactive to light.  Cardiovascular:     Rate and Rhythm: Normal rate and regular rhythm.  Pulmonary:     Effort: Pulmonary effort is normal. No respiratory distress.     Comments: Diffuse expiratory wheezing bilaterally, patient able to speak in full sentences,, 86-88% on room air Abdominal:     General: There is no distension.     Tenderness: There is no abdominal tenderness.  Skin:    General: Skin is warm and dry.  Neurological:     General: No focal deficit present.     Mental Status: She is alert. Mental status is at baseline.  Psychiatric:        Mood and Affect: Mood normal.        Behavior: Behavior normal.     ED Results / Procedures / Treatments   Labs (all labs ordered are listed, but only abnormal results are displayed) Labs Reviewed  CBC WITH DIFFERENTIAL/PLATELET - Abnormal; Notable for the following components:      Result Value   RBC 3.72 (*)    Hemoglobin 11.8 (*)    HCT 34.2 (*)    All other components within normal limits  I-STAT VENOUS BLOOD GAS, ED - Abnormal; Notable for the following components:   pCO2, Ven 43.1 (*)    pO2, Ven 26 (*)    HCT 34.0 (*)    Hemoglobin 11.6 (*)    All other components within normal limits  BASIC METABOLIC PANEL    EKG None  Radiology DG Chest 2 View  Result Date: 02/26/2022 CLINICAL DATA:  Shortness of breath with cough and congestion for 6 days. EXAM: CHEST - 2 VIEW COMPARISON:  Radiographs 02/22/2022.  CT 06/06/2021. FINDINGS: The heart size and mediastinal contours are stable. There are advanced emphysematous changes in both lungs with scattered parenchymal scarring. No airspace disease, edema, pleural effusion or pneumothorax identified. Scattered surgical clips and bilateral breast implants are noted. No acute osseous findings are seen.  IMPRESSION: Stable chronic emphysema and pulmonary scarring. No evidence of acute superimposed process. Electronically Signed   By: Richardean Sale M.D.   On: 02/26/2022 09:56    Procedures .Critical Care  Performed by: Wyvonnia Dusky, MD Authorized by: Wyvonnia Dusky, MD   Critical care provider statement:    Critical care time (minutes):  45   Critical care time was exclusive of:  Separately billable procedures and treating other patients   Critical care was necessary to treat or prevent imminent or life-threatening deterioration of the following conditions:  Respiratory failure   Critical care was time spent personally by me on the following activities:  Ordering and performing treatments and interventions, ordering and review of laboratory studies, ordering and review of radiographic studies, pulse oximetry, review of old charts, examination of patient and evaluation of patient's response to treatment   Care discussed with: admitting provider       Medications Ordered in ED Medications  albuterol (PROVENTIL) (2.5 MG/3ML) 0.083% nebulizer solution 2.5 mg (has no administration in time range)  albuterol (PROVENTIL) (2.5 MG/3ML) 0.083% nebulizer solution 7.5 mg (7.5 mg Nebulization Given 02/26/22 0958)  ipratropium-albuterol (DUONEB) 0.5-2.5 (3) MG/3ML nebulizer solution 3 mL (3 mLs Nebulization Given 02/26/22 0958)  magnesium sulfate IVPB 2 g 50 mL (0 g Intravenous Stopped 02/26/22 1121)  methylPREDNISolone sodium succinate (SOLU-MEDROL) 125 mg/2 mL injection 125 mg (125 mg Intravenous Given 02/26/22 1042)    ED Course/ Medical Decision Making/ A&P Clinical Course as of 02/26/22 1459  Thu Feb 26, 2022  1217 Patient reassessed, after breathing treatments.  She remains on 2 L nasal cannula.  She still has audible wheezing predominantly in the right side of the lung.  I think at this point she will need admission to the hospital and she is in agreement with this plan. [MT]  1238  Admitted to hospitalist dr Olevia Bowens [MT]    Clinical Course User Index [MT] Langston Masker Carola Rhine, MD                           Medical Decision Making Amount and/or Complexity of Data Reviewed Labs: ordered. Radiology: ordered.  Risk Prescription drug management. Decision regarding hospitalization.   Patient is here with shortness of breath, differential diagnosis includes COPD exacerbation versus pneumonia versus pneumothorax versus other  Her smoking history is a risk factor for COPD.  Her exam is consistent with COPD exacerbation.  X-rays ordered and personally reviewed and interpreted, no focal infiltrate or evidence of pneumonia.  Blood work personally viewed interpreted, showing no emergent findings.  Supplement is provided by the patient's husband at bedside.  She is stable currently on continuous nebulizers, not requiring BiPAP or intubation at this time.  We will give IV magnesium, as well as IV steroids.  The patient clarifies that she does not in fact have allergies to steroids.  They cause some tachycardia, do not cause shortness of breath, hives, anaphylaxis.  She has easily tolerated them in the past.  Therefore I have adjusted and changed her allergy contraindication documented to prednisone.        Final Clinical Impression(s) / ED Diagnoses Final diagnoses:  COPD exacerbation [facility])    Rx / DC Orders ED Discharge Orders     None         Malkie Wille, Carola Rhine, MD 02/26/22 1500

## 2022-02-26 NOTE — Progress Notes (Signed)
Plan of Care Note for accepted transfer   Patient: Stacy Fernandez MRN: [id number]   DOA: 02/26/2022  Facility [facility] transfer: [facility]  Requesting Provider: Octaviano Glow, MD Reason for transfer: COPD exacerbation. Facility course:  Per EDP:  "Chief Complaint  Patient presents with   COPD      Makalyn Lennox is a 61 y.o. female with history of COPD, daily smoker, presenting from home with concern of shortness of breath.  Patient was seen in the ER 4 days ago for the same symptoms, treated with DuoNebs, was not given steroids at the time.  She reports that she continues to have coughing and wheezing.  She has not smoked all week due to this.  She typically has a baseline oxygen saturation of 90 to 92%."  Date/Time Temp Pulse ECG Heart Rate Resp BP Patient Position (if appropriate) SpO2 O2 Device O2 Flow Rate (L/min) FiO2 (%) Weight Who  02/26/22 1200 -- 102 Abnormal  104 Abnormal  22 Abnormal  92/57 Abnormal  -- 93 % -- -- -- -- AB  02/26/22 1119 -- 109 Abnormal  110 Abnormal  24 Abnormal  101/61 -- 93 % -- -- -- -- AB  02/26/22 1058 -- 115 Abnormal  -- 20 -- Sitting 94 % Nasal Cannula 2 L/min -- -- MJ  02/26/22 1008 -- -- -- -- -- -- 94 % Aerosol Mask 7 L/min -- -- MJ  02/26/22 0959 -- 106 Abnormal  -- 24 Abnormal  111/70 Lying 88 % Abnormal  Room Air -- -- -- MJ  02/26/22 09:14:56 97.5 F (36.4 C) Abnormal  112 Abnormal  -- 22 Abnormal  124/83 Sitting 93 % Room Air -- 21 % -- MJ  02/26/22 0914               Basic metabolic panel [[id number]]   Collected: 02/26/22 1018   Updated: 02/26/22 1120   Specimen Type: Blood   Specimen Source: Vein    Sodium 137 mmol/L   Potassium 3.7 mmol/L   Chloride 103 mmol/L   CO2 23 mmol/L   Glucose, Bld 91 mg/dL   BUN 12 mg/dL   Creatinine, Ser 0.77 mg/dL   Calcium 9.1 mg/dL   GFR, Estimated >60 mL/min   Anion gap 11  CBC with Differential [[id number]] (Abnormal)   Collected: 02/26/22 1018   Updated: 02/26/22 1056   Specimen Type:  Blood   Specimen Source: Vein    WBC 8.4 K/uL   RBC 3.72 Low  MIL/uL   Hemoglobin 11.8 Low  g/dL   HCT 34.2 Low  %   MCV 91.9 fL   MCH 31.7 pg   MCHC 34.5 g/dL   RDW 11.8 %   Platelets 203 K/uL   nRBC 0.0 %   Neutrophils Relative % 78 %   Neutro Abs 6.5 K/uL   Lymphocytes Relative 15 %   Lymphs Abs 1.2 K/uL   Monocytes Relative 7 %   Monocytes Absolute 0.6 K/uL   Eosinophils Relative 0 %   Eosinophils Absolute 0.0 K/uL   Basophils Relative 0 %   Basophils Absolute 0.0 K/uL   Immature Granulocytes 0 %   Abs Immature Granulocytes 0.03 K/uL  I-Stat venous blood gas, ED [[id number]] (Abnormal)   Collected: 02/26/22 1041   Updated: 02/26/22 1044    pH, Ven 7.358   pCO2, Ven 43.1 Low  mmHg   pO2, Ven 26 Low Panic   mmHg   Bicarbonate 24.2 mmol/L   TCO2 26 mmol/L  O2 Saturation 45 %   Acid-base deficit 1.0 mmol/L   Sodium 137 mmol/L   Potassium 3.8 mmol/L   Calcium, Ion 1.22 mmol/L   HCT 34.0 Low  %   Hemoglobin 11.6 Low  g/dL   Sample type VENOUS   Comment NOTIFIED PHYSICIAN   EXAM: CHEST - 2 VIEW   COMPARISON:  Radiographs 02/22/2022.  CT 06/06/2021.   FINDINGS: The heart size and mediastinal contours are stable. There are advanced emphysematous changes in both lungs with scattered parenchymal scarring. No airspace disease, edema, pleural effusion or pneumothorax identified. Scattered surgical clips and bilateral breast implants are noted. No acute osseous findings are seen.   IMPRESSION: Stable chronic emphysema and pulmonary scarring. No evidence of acute superimposed process.     Electronically Signed   By: Richardean Sale M.D.   On: 02/26/2022 09:56    Plan of care: The patient is accepted for admission to Telemetry unit, at [facility]..   Author: Reubin Milan, MD 02/26/2022  Check [email or web address] for on-call coverage.  Nursing staff, Please call [facility] number on Amion as soon as patient's arrival, so  appropriate admitting provider can evaluate the pt.

## 2022-02-26 NOTE — Assessment & Plan Note (Signed)
Continue levothyroxine 

## 2022-02-26 NOTE — ED Notes (Signed)
Report given to [facility] 

## 2022-02-27 DIAGNOSIS — J439 Emphysema, unspecified: Secondary | ICD-10-CM | POA: Diagnosis present

## 2022-02-27 DIAGNOSIS — Z79899 Other long term (current) drug therapy: Secondary | ICD-10-CM | POA: Diagnosis not present

## 2022-02-27 DIAGNOSIS — Z9221 Personal history of antineoplastic chemotherapy: Secondary | ICD-10-CM | POA: Diagnosis not present

## 2022-02-27 DIAGNOSIS — Z8041 Family history of malignant neoplasm of ovary: Secondary | ICD-10-CM | POA: Diagnosis not present

## 2022-02-27 DIAGNOSIS — Z800 Family history of malignant neoplasm of digestive organs: Secondary | ICD-10-CM | POA: Diagnosis not present

## 2022-02-27 DIAGNOSIS — J9601 Acute respiratory failure with hypoxia: Secondary | ICD-10-CM | POA: Diagnosis not present

## 2022-02-27 DIAGNOSIS — F17210 Nicotine dependence, cigarettes, uncomplicated: Secondary | ICD-10-CM | POA: Diagnosis present

## 2022-02-27 DIAGNOSIS — K219 Gastro-esophageal reflux disease without esophagitis: Secondary | ICD-10-CM | POA: Diagnosis present

## 2022-02-27 DIAGNOSIS — F419 Anxiety disorder, unspecified: Secondary | ICD-10-CM | POA: Diagnosis present

## 2022-02-27 DIAGNOSIS — Z90710 Acquired absence of both cervix and uterus: Secondary | ICD-10-CM | POA: Diagnosis not present

## 2022-02-27 DIAGNOSIS — C50412 Malignant neoplasm of upper-outer quadrant of left female breast: Secondary | ICD-10-CM | POA: Diagnosis not present

## 2022-02-27 DIAGNOSIS — Z9013 Acquired absence of bilateral breasts and nipples: Secondary | ICD-10-CM | POA: Diagnosis not present

## 2022-02-27 DIAGNOSIS — Z808 Family history of malignant neoplasm of other organs or systems: Secondary | ICD-10-CM | POA: Diagnosis not present

## 2022-02-27 DIAGNOSIS — Z803 Family history of malignant neoplasm of breast: Secondary | ICD-10-CM | POA: Diagnosis not present

## 2022-02-27 DIAGNOSIS — I2510 Atherosclerotic heart disease of native coronary artery without angina pectoris: Secondary | ICD-10-CM | POA: Diagnosis present

## 2022-02-27 DIAGNOSIS — Z7982 Long term (current) use of aspirin: Secondary | ICD-10-CM | POA: Diagnosis not present

## 2022-02-27 DIAGNOSIS — Z853 Personal history of malignant neoplasm of breast: Secondary | ICD-10-CM | POA: Diagnosis not present

## 2022-02-27 DIAGNOSIS — J441 Chronic obstructive pulmonary disease with (acute) exacerbation: Secondary | ICD-10-CM | POA: Diagnosis not present

## 2022-02-27 DIAGNOSIS — E039 Hypothyroidism, unspecified: Secondary | ICD-10-CM | POA: Diagnosis not present

## 2022-02-27 DIAGNOSIS — Z9882 Breast implant status: Secondary | ICD-10-CM | POA: Diagnosis not present

## 2022-02-27 DIAGNOSIS — Z8249 Family history of ischemic heart disease and other diseases of the circulatory system: Secondary | ICD-10-CM | POA: Diagnosis not present

## 2022-02-27 DIAGNOSIS — Z79890 Hormone replacement therapy (postmenopausal): Secondary | ICD-10-CM | POA: Diagnosis not present

## 2022-02-27 DIAGNOSIS — N183 Chronic kidney disease, stage 3 unspecified: Secondary | ICD-10-CM | POA: Diagnosis present

## 2022-02-27 DIAGNOSIS — Z90722 Acquired absence of ovaries, bilateral: Secondary | ICD-10-CM | POA: Diagnosis not present

## 2022-02-27 DIAGNOSIS — Z888 Allergy status to other drugs, medicaments and biological substances status: Secondary | ICD-10-CM | POA: Diagnosis not present

## 2022-02-27 LAB — CBC
HCT: 33.8 % — ABNORMAL LOW (ref 36.0–46.0)
Hemoglobin: 11.5 g/dL — ABNORMAL LOW (ref 12.0–15.0)
MCH: 32.1 pg (ref 26.0–34.0)
MCHC: 34 g/dL (ref 30.0–36.0)
MCV: 94.4 fL (ref 80.0–100.0)
Platelets: 235 10*3/uL (ref 150–400)
RBC: 3.58 MIL/uL — ABNORMAL LOW (ref 3.87–5.11)
RDW: 11.6 % (ref 11.5–15.5)
WBC: 9 10*3/uL (ref 4.0–10.5)
nRBC: 0 % (ref 0.0–0.2)

## 2022-02-27 MED ORDER — ALBUTEROL SULFATE (2.5 MG/3ML) 0.083% IN NEBU
2.5000 mg | INHALATION_SOLUTION | Freq: Three times a day (TID) | RESPIRATORY_TRACT | Status: DC
Start: 2022-02-27 — End: 2022-02-27

## 2022-02-27 MED ORDER — ALBUTEROL SULFATE (2.5 MG/3ML) 0.083% IN NEBU: 2.5000 mg | INHALATION_SOLUTION | RESPIRATORY_TRACT | Status: DC | PRN

## 2022-02-27 MED ORDER — IPRATROPIUM-ALBUTEROL 0.5-2.5 (3) MG/3ML IN SOLN
3.0000 mL | Freq: Four times a day (QID) | RESPIRATORY_TRACT | Status: DC
Start: 2022-02-27 — End: 2022-02-27
  Administered 2022-02-27 (×3): 3 mL via RESPIRATORY_TRACT
  Filled 2022-02-27 (×3): qty 3

## 2022-02-27 MED ORDER — ALBUTEROL SULFATE (2.5 MG/3ML) 0.083% IN NEBU
2.5000 mg | INHALATION_SOLUTION | Freq: Three times a day (TID) | RESPIRATORY_TRACT | Status: DC
  Administered 2022-02-28: 2.5 mg via RESPIRATORY_TRACT
  Filled 2022-02-27: qty 3

## 2022-02-27 MED ORDER — METHYLPREDNISOLONE SODIUM SUCC 40 MG IJ SOLR
40.0000 mg | Freq: Two times a day (BID) | INTRAMUSCULAR | Status: DC
  Administered 2022-02-27 – 2022-02-28 (×2): 40 mg via INTRAVENOUS
  Filled 2022-02-27 (×2): qty 1

## 2022-02-27 MED ORDER — AZITHROMYCIN 250 MG PO TABS
500.0000 mg | ORAL_TABLET | Freq: Every day | ORAL | Status: DC
  Administered 2022-02-27 – 2022-02-28 (×2): 500 mg via ORAL
  Filled 2022-02-27 (×2): qty 2

## 2022-02-27 NOTE — Progress Notes (Signed)
24 hour chart audit completed 

## 2022-02-27 NOTE — Progress Notes (Signed)
SATURATION QUALIFICATIONS: (This note is used to comply with regulatory documentation for home oxygen)  Patient Saturations on Room Air at Rest = 92%  Patient Saturations on Room Air while Ambulating = 96%  Please briefly explain why patient needs home oxygen: Patient does not need oxygen for home.

## 2022-02-27 NOTE — Progress Notes (Signed)
PROGRESS NOTE    Stacy Fernandez  FVC:[id number] DOB: [birth year] DOA: 02/26/2022 PCP: Jettie Booze, NP   Brief Narrative:  61 y.o. female with medical history significant of breast cancer s/p bilateral mastectomy and chemotherapy in remission, chronic migraines, CAD, hypothyroidism, COPD with ongoing tobacco use presented with worsening shortness of breath. On presentation, she was hypoxic to 88% patient reporting 7L oxygen subsequently weaned down to 2 L via nasal cannula.  Chest x-ray showed stable emphysema.  She was started on IV Solu-Medrol and nebs.  Assessment & Plan:   COPD exacerbation Acute respiratory failure with hypoxia Ongoing tobacco use -Counseled regarding tobacco cessation.  Still requiring 2 L oxygen via nasal cannula.  Wean off as able. -Still wheezing.  Increase Solu-Medrol to 40 mg IV every 12 hours.  Continue nebs and current inhaled regimen -Switch Rocephin to oral Zithromax. -Outpatient follow-up with pulmonary  Left breast cancer status postchemotherapy and bilateral mastectomy -Currently in remission and followed by PCP  CAD -Continue aspirin and beta-blocker -Stable.  No chest pain.  Hypothyroidism -Continue levothyroxine   DVT prophylaxis: Lovenox Code Status: Full Family Communication: None at bedside Disposition Plan: Status is: Observation The patient will require care spanning > 2 midnights and should be moved to inpatient because: Of severity of illness.  Need for IV steroids.    Consultants: None  Procedures: None  Antimicrobials: Switch Rocephin to Zithromax   Subjective: Patient seen and examined at bedside.  Feels slightly better but still short of breath with exertion with intermittent mostly dry cough.  No fever, vomiting, chest pain reported.  Objective: Vitals:   02/27/22 0021 02/27/22 0426 02/27/22 0745 02/27/22 0827  BP: 108/71 109/70  106/68  Pulse: 78 72  86  Resp: 17 18    Temp: 98 F (36.7 C) 98 F (36.7 C)   97.8 F (36.6 C)  TempSrc: Oral Oral  Oral  SpO2: 97% 98% 97% 100%  Weight:      Height:        Intake/Output Summary (Last 24 hours) at 02/27/2022 1049 Last data filed at 02/27/2022 0811 Gross per 24 hour  Intake 985.78 ml  Output --  Net 985.78 ml   Filed Weights   02/26/22 0914  Weight: 64.9 kg    Examination:  General exam: Appears calm and comfortable.  Currently on room air. Respiratory system: Bilateral decreased breath sounds at bases, scattered crackles and wheezing Cardiovascular system: S1 & S2 heard, Rate controlled Gastrointestinal system: Abdomen is nondistended, soft and nontender. Normal bowel sounds heard. Extremities: No cyanosis, clubbing, edema  Central nervous system: Alert and oriented. No focal neurological deficits. Moving extremities Skin: No rashes, lesions or ulcers Psychiatry: Judgement and insight appear normal. Mood & affect appropriate.     Data Reviewed: I have personally reviewed following labs and imaging studies  CBC: Recent Labs  Lab 02/22/22 1540 02/26/22 1018 02/26/22 1041 02/27/22 0643  WBC 4.3 8.4  --  9.0  NEUTROABS 3.2 6.5  --   --   HGB 12.4 11.8* 11.6* 11.5*  HCT 36.2 34.2* 34.0* 33.8*  MCV 93.8 91.9  --  94.4  PLT 181 203  --  638   Basic Metabolic Panel: Recent Labs  Lab 02/22/22 1540 02/26/22 1018 02/26/22 1041  NA 140 137 137  K 3.8 3.7 3.8  CL 105 103  --   CO2 23 23  --   GLUCOSE 87 91  --   BUN 11 12  --   CREATININE  0.85 0.77  --   CALCIUM 9.4 9.1  --    GFR: Estimated Creatinine Clearance: 70 mL/min (by C-G formula based on SCr of 0.77 mg/dL). Liver Function Tests: Recent Labs  Lab 02/22/22 1540  AST 17  ALT 15  ALKPHOS 66  BILITOT 0.7  PROT 7.5  ALBUMIN 4.7   No results for input(s): "LIPASE", "AMYLASE" in the last 168 hours. No results for input(s): "AMMONIA" in the last 168 hours. Coagulation Profile: No results for input(s): "INR", "PROTIME" in the last 168 hours. Cardiac  Enzymes: No results for input(s): "CKTOTAL", "CKMB", "CKMBINDEX", "TROPONINI" in the last 168 hours. BNP (last 3 results) No results for input(s): "PROBNP" in the last 8760 hours. HbA1C: No results for input(s): "HGBA1C" in the last 72 hours. CBG: No results for input(s): "GLUCAP" in the last 168 hours. Lipid Profile: No results for input(s): "CHOL", "HDL", "LDLCALC", "TRIG", "CHOLHDL", "LDLDIRECT" in the last 72 hours. Thyroid Function Tests: No results for input(s): "TSH", "T4TOTAL", "FREET4", "T3FREE", "THYROIDAB" in the last 72 hours. Anemia Panel: No results for input(s): "VITAMINB12", "FOLATE", "FERRITIN", "TIBC", "IRON", "RETICCTPCT" in the last 72 hours. Sepsis Labs: No results for input(s): "PROCALCITON", "LATICACIDVEN" in the last 168 hours.  Recent Results (from the past 240 hour(s))  Resp Panel by RT-PCR (Flu A&B, Covid) Anterior Nasal Swab     Status: None   Collection Time: 02/22/22  3:40 PM   Specimen: Anterior Nasal Swab  Result Value Ref Range Status   SARS Coronavirus 2 by RT PCR NEGATIVE NEGATIVE Final    Comment: (NOTE) SARS-CoV-2 target nucleic acids are NOT DETECTED.  The SARS-CoV-2 RNA is generally detectable in upper respiratory specimens during the acute phase of infection. The lowest concentration of SARS-CoV-2 viral copies this assay can detect is 138 copies/mL. A negative result does not preclude SARS-Cov-2 infection and should not be used as the sole basis for treatment or other patient management decisions. A negative result may occur with  improper specimen collection/handling, submission of specimen other than nasopharyngeal swab, presence of viral mutation(s) within the areas targeted by this assay, and inadequate number of viral copies(<138 copies/mL). A negative result must be combined with clinical observations, patient history, and epidemiological information. The expected result is Negative.  Fact Sheet for Patients:   EntrepreneurPulse.com.au  Fact Sheet for Healthcare Providers:  IncredibleEmployment.be  This test is no t yet approved or cleared by the [place] FDA and  has been authorized for detection and/or diagnosis of SARS-CoV-2 by FDA under an Emergency Use Authorization (EUA). This EUA will remain  in effect (meaning this test can be used) for the duration of the COVID-19 declaration under Section 564(b)(1) of the Act, 21 U.S.C.section 360bbb-3(b)(1), unless the authorization is terminated  or revoked sooner.       Influenza A by PCR NEGATIVE NEGATIVE Final   Influenza B by PCR NEGATIVE NEGATIVE Final    Comment: (NOTE) The Xpert Xpress SARS-CoV-2/FLU/RSV plus assay is intended as an aid in the diagnosis of influenza from Nasopharyngeal swab specimens and should not be used as a sole basis for treatment. Nasal washings and aspirates are unacceptable for Xpert Xpress SARS-CoV-2/FLU/RSV testing.  Fact Sheet for Patients: EntrepreneurPulse.com.au  Fact Sheet for Healthcare Providers: IncredibleEmployment.be  This test is not yet approved or cleared by the [place] FDA and has been authorized for detection and/or diagnosis of SARS-CoV-2 by FDA under an Emergency Use Authorization (EUA). This EUA will remain in effect (meaning this test can be used) for  the duration of the COVID-19 declaration under Section 564(b)(1) of the Act, 21 U.S.C. section 360bbb-3(b)(1), unless the authorization is terminated or revoked.  Performed at [facility], [address], [place], [place] [address]          Radiology Studies: DG Chest 2 View  Result Date: 02/26/2022 CLINICAL DATA:  Shortness of breath with cough and congestion for 6 days. EXAM: CHEST - 2 VIEW COMPARISON:  Radiographs 02/22/2022.  CT 06/06/2021. FINDINGS: The heart size and mediastinal contours are stable. There are advanced  emphysematous changes in both lungs with scattered parenchymal scarring. No airspace disease, edema, pleural effusion or pneumothorax identified. Scattered surgical clips and bilateral breast implants are noted. No acute osseous findings are seen. IMPRESSION: Stable chronic emphysema and pulmonary scarring. No evidence of acute superimposed process. Electronically Signed   By: Richardean Sale M.D.   On: 02/26/2022 09:56        Scheduled Meds:  ALPRAZolam  0.25 mg Oral Daily   aspirin EC  81 mg Oral Daily   azithromycin  500 mg Oral Daily   enoxaparin (LOVENOX) injection  40 mg Subcutaneous Q24H   fluticasone furoate-vilanterol  1 puff Inhalation Daily   And   umeclidinium bromide  1 puff Inhalation Daily   icosapent Ethyl  2 g Oral BID   ipratropium-albuterol  3 mL Nebulization Q6H   levothyroxine  75 mcg Oral Q0600   loratadine  10 mg Oral Daily   methylPREDNISolone (SOLU-MEDROL) injection  40 mg Intravenous Daily   metoprolol tartrate  37.5 mg Oral BID   montelukast  10 mg Oral QHS   nicotine  21 mg Transdermal Daily   omega-3 acid ethyl esters  1 g Oral BID   pantoprazole  40 mg Oral Daily   Continuous Infusions:        Aline August, MD [facility] 02/27/2022, 10:49 AM

## 2022-02-27 NOTE — TOC Progression Note (Signed)
Transition of Care [facility]) - Progression Note    Patient Details  Name: Stacy Fernandez MRN: [id number] Date of Birth: [birth year]  Transition [facility] CM/SW Contact  Servando Snare, [place] Phone Number: 02/27/2022, 12:50 PM  Clinical Narrative:      Transition of Care [facility]) Screening Note   Patient Details  Name: Stacy Fernandez Date of Birth: 03-Oct-1960   Transition of Care [facility]) CM/SW Contact:    Servando Snare, LCSW Phone Number: 02/27/2022, 12:50 PM    Transition of Care Department [facility]) has reviewed patient and no TOC needs have been identified at this time. We will continue to monitor patient advancement through interdisciplinary progression rounds. If new patient transition needs arise, please place a TOC consult.         Expected Discharge Plan and Services                                                 Social Determinants of Health (SDOH) Interventions    Readmission Risk Interventions     No data to display

## 2022-02-27 NOTE — Progress Notes (Signed)
Patient ambulated on room air 200 ftx2 walks today. Patient maintained SPO2>90%.

## 2022-02-28 DIAGNOSIS — J9601 Acute respiratory failure with hypoxia: Secondary | ICD-10-CM | POA: Diagnosis not present

## 2022-02-28 DIAGNOSIS — E039 Hypothyroidism, unspecified: Secondary | ICD-10-CM | POA: Diagnosis not present

## 2022-02-28 DIAGNOSIS — I2510 Atherosclerotic heart disease of native coronary artery without angina pectoris: Secondary | ICD-10-CM | POA: Diagnosis not present

## 2022-02-28 DIAGNOSIS — J441 Chronic obstructive pulmonary disease with (acute) exacerbation: Secondary | ICD-10-CM | POA: Diagnosis not present

## 2022-02-28 MED ORDER — PREDNISONE 20 MG PO TABS
40.0000 mg | ORAL_TABLET | Freq: Every day | ORAL | 0 refills | Status: AC
Start: 2022-02-28 — End: 2022-03-07

## 2022-02-28 NOTE — Plan of Care (Signed)
  Problem: Education: Goal: Knowledge of General Education information will improve Description: Including pain rating scale, medication(s)/side effects and non-pharmacologic comfort measures Outcome: Progressing   Problem: Clinical Measurements: Goal: Respiratory complications will improve Outcome: Progressing   Problem: Safety: Goal: Ability to remain free from injury will improve Outcome: Progressing   

## 2022-02-28 NOTE — Discharge Summary (Signed)
Physician Discharge Summary  Stacy Fernandez NID:[id number] DOB: Apr 13, 1961 DOA: 02/26/2022  PCP: Jettie Booze, NP  Admit date: 02/26/2022 Discharge date: 02/28/2022  Admitted From: Home Disposition: Home  Recommendations for Outpatient Follow-up:  Follow up with PCP in 1 week  Outpatient follow-up with pulmonary Counseled regarding tobacco cessation Follow up in ED if symptoms worsen or new appear   Home Health: No Equipment/Devices: None  Discharge Condition: Stable CODE STATUS: Full Diet recommendation: Regular  Brief/Interim Summary: 61 y.o. female with medical history significant of breast cancer s/p bilateral mastectomy and chemotherapy in remission, chronic migraines, CAD, hypothyroidism, COPD with ongoing tobacco use presented with worsening shortness of breath. On presentation, she was hypoxic to 88% patient reporting 7L oxygen subsequently weaned down to 2 L via nasal cannula.  Chest x-ray showed stable emphysema.  She was started on IV Solu-Medrol and nebs.  During the hospitalization, her condition has improved.  She is currently on room air.  She will be discharged home on oral prednisone today.  Outpatient follow-up with PCP and pulmonary.  Discharge Diagnoses:   COPD exacerbation Acute respiratory failure with hypoxia Ongoing tobacco use -Counseled regarding tobacco cessation.   -Treated with IV Solu-Medrol along with nebs. -Respiratory status has much improved.  Currently on room air. -Outpatient follow-up with pulmonary -She will be discharged home today on oral prednisone 40 mg daily for 7 days.  Continue home inhaled regimen.   Left breast cancer status postchemotherapy and bilateral mastectomy -Currently in remission and followed by PCP   CAD -Continue aspirin and beta-blocker -Stable.  No chest pain.   Hypothyroidism -Continue levothyroxine  Discharge Instructions  Discharge Instructions     Diet general   Complete by: As directed    Increase  activity slowly   Complete by: As directed       Allergies as of 02/28/2022       Reactions   Prednisone Palpitations   Patient reports steroids cause tachycardia - DENIES that she has had airway swelling, difficulty breathing, or hives on steroids, and has tolerated steroid courses many times in the past.        Medication List     STOP taking these medications    nicotine 10 MG inhaler Commonly known as: Nicotrol       TAKE these medications    acetaminophen 325 MG tablet Commonly known as: TYLENOL Take 2 tablets (650 mg total) by mouth every 6 (six) hours as needed for mild pain or headache (fever >/= 101).   Aimovig 70 MG/ML Soaj Generic drug: Erenumab-aooe Inject 70 mg into the skin every 30 (thirty) days.   ALPRAZolam 0.25 MG tablet Commonly known as: XANAX Take 0.25 mg by mouth 2 (two) times daily.   aspirin EC 81 MG tablet Take 1 tablet (81 mg total) by mouth daily. What changed: additional instructions   HYDROcodone-acetaminophen 5-325 MG tablet Commonly known as: NORCO/VICODIN Take 1 tablet by mouth every 6 (six) hours as needed for moderate pain.   icosapent Ethyl 1 g capsule Commonly known as: Vascepa Take 2 capsules (2 g total) by mouth 2 (two) times daily.   levothyroxine 75 MCG tablet Commonly known as: SYNTHROID Take 75 mcg by mouth daily. What changed: Another medication with the same name was removed. Continue taking this medication, and follow the directions you see here.   loratadine 10 MG tablet Commonly known as: CLARITIN Take 10 mg by mouth daily.   metoprolol tartrate 25 MG tablet Commonly known as: LOPRESSOR  TAKE 1.5 TABLET BY MOUTH IN THE MORNING AND 1.5 TABLETS IN THE EVENING.   montelukast 10 MG tablet Commonly known as: SINGULAIR Take 10 mg by mouth at bedtime.   omega-3 acid ethyl esters 1 g capsule Commonly known as: LOVAZA TAKE 1 CAPSULE BY MOUTH 2 (TWO) TIMES DAILY.   pantoprazole 40 MG tablet Commonly known as:  PROTONIX Take 1 tablet (40 mg total) by mouth daily.   predniSONE 20 MG tablet Commonly known as: DELTASONE Take 2 tablets (40 mg total) by mouth daily with breakfast for 7 days.   ProAir HFA 108 (90 Base) MCG/ACT inhaler Generic drug: albuterol Inhale 2 puffs into the lungs every 4 (four) hours as needed for wheezing or shortness of breath. Reported on 07/08/2015   albuterol (2.5 MG/3ML) 0.083% nebulizer solution Commonly known as: PROVENTIL Take 3 mLs (2.5 mg total) by nebulization every 6 (six) hours as needed for wheezing or shortness of breath.   Trelegy Ellipta 100-62.5-25 MCG/ACT Aepb Generic drug: Fluticasone-Umeclidin-Vilant Inhale 100 mcg into the lungs daily. What changed: Another medication with the same name was removed. Continue taking this medication, and follow the directions you see here.   Ubrelvy 50 MG Tabs Generic drug: Ubrogepant Take 1 tab at onset of migraine.  May repeat in 2 hrs, if needed.  Max dose: 2 tabs/day. This is a 30 day prescription.        Follow-up Information     Jettie Booze, NP. Schedule an appointment as soon as possible for a visit in 1 week(s).   Specialty: Family Medicine Contact information: [place] [address] [place] [address] [phone]         Troy Sine, MD .   Specialty: Cardiology Contact information: [address] [place] [address] [phone]                Allergies  Allergen Reactions   Prednisone Palpitations    Patient reports steroids cause tachycardia - DENIES that she has had airway swelling, difficulty breathing, or hives on steroids, and has tolerated steroid courses many times in the past.    Consultations: None   Procedures/Studies: DG Chest 2 View  Result Date: 02/26/2022 CLINICAL DATA:  Shortness of breath with cough and congestion for 6 days. EXAM: CHEST - 2 VIEW COMPARISON:  Radiographs 02/22/2022.  CT 06/06/2021. FINDINGS: The heart size and  mediastinal contours are stable. There are advanced emphysematous changes in both lungs with scattered parenchymal scarring. No airspace disease, edema, pleural effusion or pneumothorax identified. Scattered surgical clips and bilateral breast implants are noted. No acute osseous findings are seen. IMPRESSION: Stable chronic emphysema and pulmonary scarring. No evidence of acute superimposed process. Electronically Signed   By: Richardean Sale M.D.   On: 02/26/2022 09:56   DG Chest Port 1 View  Result Date: 02/22/2022 CLINICAL DATA:  Two 3 days of increasing cough with yellow phlegm. EXAM: PORTABLE CHEST 1 VIEW COMPARISON:  10/24/2018.  CT, 06/06/2021. FINDINGS: Cardiac silhouette is normal in size and configuration. No mediastinal or hilar masses. Lungs are hyperexpanded. Chronic interstitial thickening. Relative lucency in the upper lobes, more evident on the right, consistent with a seem a. No lung consolidation. No evidence of edema. No pleural effusion or pneumothorax. Skeletal structures are grossly intact. IMPRESSION: 1. No acute cardiopulmonary disease. Electronically Signed   By: Lajean Manes M.D.   On: 02/22/2022 16:31      Subjective: Patient seen and examined at bedside.  Feels  okay to go home today.  Breathing is improving.  Discharge Exam: Vitals:   02/28/22 0510 02/28/22 0739  BP: (!) 108/59   Pulse: 77 73  Resp: 16 18  Temp: 97.9 F (36.6 C)   SpO2: 96% 95%    General: Pt is alert, awake, not in acute distress.  Currently on room air Cardiovascular: rate controlled, S1/S2 + Respiratory: bilateral decreased breath sounds at bases with much improved wheezing Abdominal: Soft, NT, ND, bowel sounds + Extremities: no edema, no cyanosis    The results of significant diagnostics from this hospitalization (including imaging, microbiology, ancillary and laboratory) are listed below for reference.     Microbiology: Recent Results (from the past 240 hour(s))  Resp Panel by  RT-PCR (Flu A&B, Covid) Anterior Nasal Swab     Status: None   Collection Time: 02/22/22  3:40 PM   Specimen: Anterior Nasal Swab  Result Value Ref Range Status   SARS Coronavirus 2 by RT PCR NEGATIVE NEGATIVE Final    Comment: (NOTE) SARS-CoV-2 target nucleic acids are NOT DETECTED.  The SARS-CoV-2 RNA is generally detectable in upper respiratory specimens during the acute phase of infection. The lowest concentration of SARS-CoV-2 viral copies this assay can detect is 138 copies/mL. A negative result does not preclude SARS-Cov-2 infection and should not be used as the sole basis for treatment or other patient management decisions. A negative result may occur with  improper specimen collection/handling, submission of specimen other than nasopharyngeal swab, presence of viral mutation(s) within the areas targeted by this assay, and inadequate number of viral copies(<138 copies/mL). A negative result must be combined with clinical observations, patient history, and epidemiological information. The expected result is Negative.  Fact Sheet for Patients:  EntrepreneurPulse.com.au  Fact Sheet for Healthcare Providers:  IncredibleEmployment.be  This test is no t yet approved or cleared by the [place] FDA and  has been authorized for detection and/or diagnosis of SARS-CoV-2 by FDA under an Emergency Use Authorization (EUA). This EUA will remain  in effect (meaning this test can be used) for the duration of the COVID-19 declaration under Section 564(b)(1) of the Act, 21 U.S.C.section 360bbb-3(b)(1), unless the authorization is terminated  or revoked sooner.       Influenza A by PCR NEGATIVE NEGATIVE Final   Influenza B by PCR NEGATIVE NEGATIVE Final    Comment: (NOTE) The Xpert Xpress SARS-CoV-2/FLU/RSV plus assay is intended as an aid in the diagnosis of influenza from Nasopharyngeal swab specimens and should not be used as a sole basis for  treatment. Nasal washings and aspirates are unacceptable for Xpert Xpress SARS-CoV-2/FLU/RSV testing.  Fact Sheet for Patients: EntrepreneurPulse.com.au  Fact Sheet for Healthcare Providers: IncredibleEmployment.be  This test is not yet approved or cleared by the [place] FDA and has been authorized for detection and/or diagnosis of SARS-CoV-2 by FDA under an Emergency Use Authorization (EUA). This EUA will remain in effect (meaning this test can be used) for the duration of the COVID-19 declaration under Section 564(b)(1) of the Act, 21 U.S.C. section 360bbb-3(b)(1), unless the authorization is terminated or revoked.  Performed at [facility], [address] [place], [place] [address]      Labs: BNP (last 3 results) No results for input(s): "BNP" in the last 8760 hours. Basic Metabolic Panel: Recent Labs  Lab 02/22/22 1540 02/26/22 1018 02/26/22 1041  NA 140 137 137  K 3.8 3.7 3.8  CL 105 103  --   CO2 23 23  --  GLUCOSE 87 91  --   BUN 11 12  --   CREATININE 0.85 0.77  --   CALCIUM 9.4 9.1  --    Liver Function Tests: Recent Labs  Lab 02/22/22 1540  AST 17  ALT 15  ALKPHOS 66  BILITOT 0.7  PROT 7.5  ALBUMIN 4.7   No results for input(s): "LIPASE", "AMYLASE" in the last 168 hours. No results for input(s): "AMMONIA" in the last 168 hours. CBC: Recent Labs  Lab 02/22/22 1540 02/26/22 1018 02/26/22 1041 02/27/22 0643  WBC 4.3 8.4  --  9.0  NEUTROABS 3.2 6.5  --   --   HGB 12.4 11.8* 11.6* 11.5*  HCT 36.2 34.2* 34.0* 33.8*  MCV 93.8 91.9  --  94.4  PLT 181 203  --  235   Cardiac Enzymes: No results for input(s): "CKTOTAL", "CKMB", "CKMBINDEX", "TROPONINI" in the last 168 hours. BNP: Invalid input(s): "POCBNP" CBG: No results for input(s): "GLUCAP" in the last 168 hours. D-Dimer No results for input(s): "DDIMER" in the last 72 hours. Hgb A1c No results for input(s): "HGBA1C" in the  last 72 hours. Lipid Profile No results for input(s): "CHOL", "HDL", "LDLCALC", "TRIG", "CHOLHDL", "LDLDIRECT" in the last 72 hours. Thyroid function studies No results for input(s): "TSH", "T4TOTAL", "T3FREE", "THYROIDAB" in the last 72 hours.  Invalid input(s): "FREET3" Anemia work up No results for input(s): "VITAMINB12", "FOLATE", "FERRITIN", "TIBC", "IRON", "RETICCTPCT" in the last 72 hours. Urinalysis    Component Value Date/Time   COLORURINE YELLOW 02/18/2015 0510   APPEARANCEUR CLOUDY (A) 02/18/2015 0510   LABSPEC 1.018 02/18/2015 0510   LABSPEC 1.010 07/10/2014 1004   PHURINE 5.0 02/18/2015 0510   GLUCOSEU NEGATIVE 02/18/2015 0510   GLUCOSEU Negative 07/10/2014 1004   HGBUR SMALL (A) 02/18/2015 0510   BILIRUBINUR NEGATIVE 02/18/2015 0510   BILIRUBINUR Negative 07/10/2014 1004   KETONESUR NEGATIVE 02/18/2015 0510   PROTEINUR NEGATIVE 02/18/2015 0510   UROBILINOGEN 0.2 02/18/2015 0510   UROBILINOGEN 0.2 07/10/2014 1004   NITRITE NEGATIVE 02/18/2015 0510   LEUKOCYTESUR MODERATE (A) 02/18/2015 0510   LEUKOCYTESUR Negative 07/10/2014 1004   Sepsis Labs Recent Labs  Lab 02/22/22 1540 02/26/22 1018 02/27/22 0643  WBC 4.3 8.4 9.0   Microbiology Recent Results (from the past 240 hour(s))  Resp Panel by RT-PCR (Flu A&B, Covid) Anterior Nasal Swab     Status: None   Collection Time: 02/22/22  3:40 PM   Specimen: Anterior Nasal Swab  Result Value Ref Range Status   SARS Coronavirus 2 by RT PCR NEGATIVE NEGATIVE Final    Comment: (NOTE) SARS-CoV-2 target nucleic acids are NOT DETECTED.  The SARS-CoV-2 RNA is generally detectable in upper respiratory specimens during the acute phase of infection. The lowest concentration of SARS-CoV-2 viral copies this assay can detect is 138 copies/mL. A negative result does not preclude SARS-Cov-2 infection and should not be used as the sole basis for treatment or other patient management decisions. A negative result may occur with   improper specimen collection/handling, submission of specimen other than nasopharyngeal swab, presence of viral mutation(s) within the areas targeted by this assay, and inadequate number of viral copies(<138 copies/mL). A negative result must be combined with clinical observations, patient history, and epidemiological information. The expected result is Negative.  Fact Sheet for Patients:  EntrepreneurPulse.com.au  Fact Sheet for Healthcare Providers:  IncredibleEmployment.be  This test is no t yet approved or cleared by the [place] FDA and  has been authorized for detection and/or diagnosis of  SARS-CoV-2 by FDA under an Emergency Use Authorization (EUA). This EUA will remain  in effect (meaning this test can be used) for the duration of the COVID-19 declaration under Section 564(b)(1) of the Act, 21 U.S.C.section 360bbb-3(b)(1), unless the authorization is terminated  or revoked sooner.       Influenza A by PCR NEGATIVE NEGATIVE Final   Influenza B by PCR NEGATIVE NEGATIVE Final    Comment: (NOTE) The Xpert Xpress SARS-CoV-2/FLU/RSV plus assay is intended as an aid in the diagnosis of influenza from Nasopharyngeal swab specimens and should not be used as a sole basis for treatment. Nasal washings and aspirates are unacceptable for Xpert Xpress SARS-CoV-2/FLU/RSV testing.  Fact Sheet for Patients: EntrepreneurPulse.com.au  Fact Sheet for Healthcare Providers: IncredibleEmployment.be  This test is not yet approved or cleared by the [place] FDA and has been authorized for detection and/or diagnosis of SARS-CoV-2 by FDA under an Emergency Use Authorization (EUA). This EUA will remain in effect (meaning this test can be used) for the duration of the COVID-19 declaration under Section 564(b)(1) of the Act, 21 U.S.C. section 360bbb-3(b)(1), unless the authorization is terminated  or revoked.  Performed at [facility], [address] [place], [place] [address]      Time coordinating discharge: 35 minutes  SIGNED:   Aline August, MD  [facility] 02/28/2022, 10:47 AM

## 2022-03-18 ENCOUNTER — Other Ambulatory Visit: Payer: Self-pay | Admitting: Cardiovascular Disease

## 2022-04-27 ENCOUNTER — Other Ambulatory Visit: Payer: Self-pay | Admitting: Cardiovascular Disease

## 2022-04-28 ENCOUNTER — Other Ambulatory Visit: Payer: Self-pay | Admitting: Cardiovascular Disease

## 2022-05-11 ENCOUNTER — Telehealth: Payer: Self-pay

## 2022-05-11 NOTE — Telephone Encounter (Signed)
PA submitted via [facility] Key: [id number];Coverage Start Date:04/11/2022;Coverage End Date:05/11/2023

## 2022-05-12 ENCOUNTER — Telehealth: Payer: Self-pay

## 2022-05-12 NOTE — Telephone Encounter (Signed)
PA Submitted via [facility] Key: [id number] Approved Coverage Start Date:04/12/2022;Coverage End Date:05/12/2023

## 2022-05-30 DIAGNOSIS — S62102A Fracture of unspecified carpal bone, left wrist, initial encounter for closed fracture: Secondary | ICD-10-CM

## 2022-05-30 DIAGNOSIS — S52125A Nondisplaced fracture of head of left radius, initial encounter for closed fracture: Secondary | ICD-10-CM | POA: Diagnosis not present

## 2022-05-30 DIAGNOSIS — W101XXA Fall (on)(from) sidewalk curb, initial encounter: Secondary | ICD-10-CM | POA: Diagnosis not present

## 2022-05-30 HISTORY — DX: Fracture of unspecified carpal bone, left wrist, initial encounter for closed fracture: S62.102A

## 2022-05-31 DIAGNOSIS — S52502A Unspecified fracture of the lower end of left radius, initial encounter for closed fracture: Secondary | ICD-10-CM | POA: Diagnosis not present

## 2022-06-01 ENCOUNTER — Encounter (HOSPITAL_BASED_OUTPATIENT_CLINIC_OR_DEPARTMENT_OTHER): Payer: Self-pay | Admitting: Orthopedic Surgery

## 2022-06-01 DIAGNOSIS — S52502D Unspecified fracture of the lower end of left radius, subsequent encounter for closed fracture with routine healing: Secondary | ICD-10-CM | POA: Diagnosis not present

## 2022-06-01 NOTE — H&P (Signed)
PREOPERATIVE H&P  Chief Complaint: LEFT WRIST FRACTURE  HPI: Stacy Fernandez is a 61 y.o. female who presents with a diagnosis of LEFT WRIST FRACTURE. Symptoms are rated as moderate to severe, and have been worsening.  This is significantly impairing activities of daily living.  She has elected for surgical management.   Past Medical History:  Diagnosis Date   Anxiety    Cervical spondylolysis    Cervicalgia    Chemotherapy-induced peripheral neuropathy (HCC)    FEET   Chronic migraine    neurologist--- dr Krista Blue;    incidently finding left supraclinoid ICA brain aneurysm, residual migraines, --  BOTOX TX'S  by dr Krista Blue   Cigarette nicotine dependence    CKD (chronic kidney disease), stage III ([place])    Continuous opioid dependence ([place])    followed by pain clinic [facility]   COPD with emphysema [facility])    pulmonologist--- dr Elsworth Soho;    advanded emphysema per CT,  pt strated back smoking ,  no oxygen;   last exacerbation 02-26-2022 admission for O2 sats 80s  d/c'd no oxygen resolved   Coronary artery disease    cardiologist--- dr Darene Lamer. 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  infundibulum--  residual chronic migraine   Wears dentures     upper   Wears glasses    Past Surgical History:  Procedure Laterality Date   APPENDECTOMY  1992   BREAST RECONSTRUCTION WITH PLACEMENT OF TISSUE EXPANDER AND FLEX HD (ACELLULAR HYDRATED DERMIS) Bilateral 09/25/2014   Procedure: PLACEMENT OF BILATERAL TISSUE EXPANDER AND  ACELLULAR DERMIS FOR BREAST RECONSTRUCTION ;  Surgeon: [name], MD;  Location: [place];  Service: Plastics;  Laterality: Bilateral;   CHOLECYSTECTOMY  1999   COLONOSCOPY  11/2021   ESOPHAGOGASTRODUODENOSCOPY (EGD) WITH PROPOFOL  01/18/2013   LAPAROSCOPIC NISSEN FUNDOPLICATION  13/24/4010   LEFT HEART CATH AND CORONARY ANGIOGRAPHY N/A 04/28/2019   Procedure: LEFT HEART CATH AND CORONARY ANGIOGRAPHY;  Surgeon: Troy Sine, MD;  Location: [place] CV [facility];  Service: Cardiovascular;  Laterality: N/A;   LEFT HEART CATHETERIZATION WITH CORONARY ANGIOGRAM N/A 12/26/2012   Procedure: LEFT HEART CATHETERIZATION WITH CORONARY ANGIOGRAM;  Surgeon: Lorretta Harp, MD;  Location: [facility] CATH LAB;  Service: Cardiovascular;  Laterality: N/A;   Nonobstructive CAD/  40-50% ostial D1/  normal LVF, ef 60%   LIPOSUCTION WITH LIPOFILLING Bilateral 12/18/2014   Procedure: LIPOFILLING TO BILATERAL CHEST;  Surgeon: [name], MD;  Location: [place];  Service: Plastics;  Laterality: Bilateral;   PORT-A-CATH REMOVAL Right 06/28/2015   Procedure: REMOVAL PORT-A-CATH;  Surgeon: Leighton Ruff, MD;  Location:  [place];  Service: General;  Laterality: Right;   PORTACATH PLACEMENT N/A 04/02/2014   Procedure: INSERTION PORT-A-CATH;  Surgeon: [name], MD;  Location: [place];  Service: General;  Laterality: N/A;   REMOVAL OF BILATERAL TISSUE EXPANDERS WITH PLACEMENT OF BILATERAL BREAST IMPLANTS Bilateral 12/18/2014   Procedure: REMOVAL OF BILATERAL TISSUE EXPANDERS,PLACEMENT SILICONE IMPLANTS ;  Surgeon: [name], MD;  Location: [place];  Service: Plastics;   Laterality: Bilateral;   SIMPLE MASTECTOMY WITH AXILLARY SENTINEL NODE BIOPSY Bilateral 04/02/2014   Procedure: LEFT TOTAL MASTECTOMY WITH LEFT AXILLARY SENTINEL NODE BIOPSY, RIGHT PROPHYLACTIC MASTETCTOMY;  Surgeon: [name], MD;  Location: [place];  Service: General;  Laterality: Bilateral;   TOTAL ABDOMINAL HYSTERECTOMY W/ BILATERAL [place] N/A 07/17/2015   Procedure: REPAIR OF RECTOVAGINAL FISTULA;  Surgeon: [name], MD;  Location: [facility];  Service: Gynecology;  Laterality: N/A;   VIDEO BRONCHOSCOPY Bilateral 01/16/2013   Procedure: VIDEO BRONCHOSCOPY WITHOUT FLUORO;  Surgeon: [name], MD;  Location: [facility];  Service: Cardiopulmonary;  Laterality: Bilateral;   Social History   Socioeconomic History   Marital status: Married    Spouse name: Eddie    Number of children: 4   Years of education: GED   Highest education level: Not on file  Occupational History    Employer: [employer]  Tobacco Use   Smoking status: Every Day    Packs/day: 0.50    Years: 42.00    Total pack years: 21.00    Types: Cigarettes   Smokeless tobacco: Never   Tobacco comments:    06-01-2022 Pt restarted smoking 2018 after quitting 2015,  currently down to 10-12 daily and has cut down prior to [id number]  (smoking since age 78)  Vaping Use   Vaping Use: Former   Devices: hx vaping/ ecig  Substance and Sexual Activity   Alcohol use: Never   Drug use: No   Sexual activity: Not Currently  Other Topics Concern   Not on file  Social History Narrative   Patient lives at home with her husband [name]) Patient works full time.   Education- GED   Right handed.   Caffeine- one cup of coffee daily.   Social Determinants of Health   Financial Resource Strain: Low Risk  (10/24/2018)   Overall Financial Resource Strain (CARDIA)    Difficulty of Paying Living Expenses: Not hard at all  Food Insecurity: No Food Insecurity  (10/24/2018)   Hunger Vital Sign    Worried About Running Out of Food in the Last Year: Never true    Ran Out of Food in the Last Year: Never true  Transportation Needs: No Transportation Needs (10/24/2018)   PRAPARE - Hydrologist (Medical): No    Lack of Transportation (Non-Medical): No  Physical Activity: Unknown (10/24/2018)   Exercise Vital Sign    Days of Exercise per Week: Patient refused    Minutes of Exercise per Session: Patient refused  Stress: Unknown (10/24/2018)   [place]    Feeling of Stress : Patient refused  Social Connections: Unknown (10/24/2018)   Social Connection and Isolation Panel [NHANES]    Frequency of Communication with Friends and Family: Patient refused    Frequency of Social Gatherings with Friends and Family: Patient refused    Attends Religious Services: Patient refused    Marine scientist or Organizations: Patient  refused    Attends Archivist Meetings: Patient refused    Marital Status: Patient refused   Family History  Problem Relation Age of Onset   Heart failure Mother    Colon cancer Father 64       stomach cancer also in 45s   Throat cancer Brother 87       smoker   Heart attack Maternal Grandmother    Colon cancer Paternal Grandmother 43   Cancer Paternal Grandfather        kidney and bladder   Ovarian cancer Sister 66       ovarian cancer at 35, colorectal cancer at 47   Throat cancer Brother 37       throat cancer, smoker   Breast cancer Paternal Aunt 75   Ovarian cancer Other 20       niece with ovarian cancer   Allergies  Allergen Reactions   Prednisone Palpitations    Patient reports steroids cause tachycardia - DENIES that she has had airway swelling, difficulty breathing, or hives on steroids, and has tolerated steroid courses many times in the past. (06-01-2022  per pt recently taken without issues)   Prior to  Admission medications   Medication Sig Start Date End Date Taking? Authorizing Provider  ALPRAZolam (XANAX) 0.25 MG tablet Take 0.25 mg by mouth 2 (two) times daily. 08/01/21  Yes [provider]  aspirin EC 81 MG tablet Take 1 tablet (81 mg total) by mouth daily. Patient taking differently: Take 81 mg by mouth daily. Chewable 01/05/19  Yes Troy Sine, MD  Fluticasone-Umeclidin-Vilant (TRELEGY ELLIPTA) 100-62.5-25 MCG/ACT AEPB Inhale 100 mcg into the lungs daily. Patient taking differently: Inhale 100 mcg into the lungs daily. 10/09/21  Yes [name], MD  HYDROcodone-acetaminophen (NORCO) 7.5-325 MG tablet Take 1 tablet by mouth 2 (two) times daily.   Yes [provider]  levothyroxine (SYNTHROID) 75 MCG tablet Take 75 mcg by mouth daily. 02/12/22  Yes [provider]  loratadine (CLARITIN) 10 MG tablet Take 10 mg by mouth daily. 02/12/22  Yes [provider]  metoprolol tartrate (LOPRESSOR) 25 MG tablet TAKE 1.5 TABLET BY MOUTH IN THE MORNING AND 1.5 TABLETS IN THE EVENING. Pt needs appt for any future refills. Patient taking differently: Take 25 mg by mouth 2 (two) times daily. TAKE 1.5 TABLET BY MOUTH IN THE MORNING AND 1.5 TABLETS IN THE EVENING. Pt needs appt for any future refills. 04/27/22  Yes Troy Sine, MD  montelukast (SINGULAIR) 10 MG tablet Take 10 mg by mouth at bedtime.  11/09/13  Yes [provider]  omega-3 acid ethyl esters (LOVAZA) 1 g capsule Take 1 capsule (1 g total) by mouth 2 (two) times daily. Schedule an appointment for further refills, 1st attempt 04/28/22  Yes Troy Sine, MD  pantoprazole (PROTONIX) 40 MG tablet Take 1 tablet (40 mg total) by mouth daily. Patient taking differently: Take 40 mg by mouth daily as needed. 11/10/19  Yes Troy Sine, MD  Ubrogepant (UBRELVY) 50 MG TABS Take 1 tab at onset of migraine.  May repeat in 2 hrs, if needed.  Max dose: 2 tabs/day. This is a 30 day prescription. Patient  taking differently: as directed. Take 1 tab at onset of migraine.  May repeat in 2 hrs, if needed.  Max dose: 2 tabs/day. This is a 30 day prescription. 05/29/21  Yes Marcial Pacas, MD  albuterol (PROVENTIL) (2.5 MG/3ML) 0.083% nebulizer solution Take 3 mLs (2.5 mg  total) by nebulization every 6 (six) hours as needed for wheezing or shortness of breath. Patient taking differently: Take 2.5 mg by nebulization every 6 (six) hours as needed for wheezing or shortness of breath. 02/22/22   Harriet Pho, PA-C  PROAIR HFA 108 (90 BASE) MCG/ACT inhaler Inhale 2 puffs into the lungs every 4 (four) hours as needed for wheezing or shortness of breath. 01/19/13   [provider]     Positive ROS: All other systems have been reviewed and were otherwise negative with the exception of those mentioned in the HPI and as above.  Physical Exam: General: Alert, no acute distress Cardiovascular: No pedal edema Respiratory: No cyanosis, no use of accessory musculature GI: No organomegaly, abdomen is soft and non-tender Skin: No lesions in the area of chief complaint Neurologic: Sensation intact distally Psychiatric: Patient is competent for consent with normal mood and affect Lymphatic: No axillary or cervical lymphadenopathy  MUSCULOSKELETAL: TTP left wrist, edema and ecchymosis present, decreased ROM wrist, NVI   Imaging: 3v xrays of the left wrist show a displaced intra-articular distal radius fracture with worsening dorsal angulation    Assessment: LEFT WRIST FRACTURE  Plan: Plan for Procedure(s): OPEN REDUCTION INTERNAL FIXATION (ORIF) WRIST FRACTURE  The risks benefits and alternatives were discussed with the patient including but not limited to the risks of nonoperative treatment, versus surgical intervention including infection, bleeding, nerve injury,  blood clots, cardiopulmonary complications, morbidity, mortality, among others, and they were willing to proceed.   Weightbearing: NWB  LUE Orthopedic devices: splint and sling Showering: POD 3 Dressing: reinforce PRN Medicines: ASA, Oxy, Tylenol, Mobic, Zofran  Discharge: home Follow up: 06/15/22 at 11:15am    Britt Bottom, PA-C Office [phone] 06/01/2022 1:20 PM

## 2022-06-01 NOTE — Progress Notes (Signed)
Spoke w/ via phone for pre-op interview--- pt Lab needs dos----   [employer] results------ current EKG in epic/ chart COVID test -----patient states asymptomatic no test needed Arrive at ------- 0845 on 06-02-2022 NPO after MN NO Solid Food.  Clear liquids from MN until--- 0745 Med rec completed Medications to take morning of surgery ----- norco, xanax, claritin, lopressor, synthroid, protonix,  trelegy inhaler, may take ubrelvy if needed Diabetic medication ----- n/a Patient instructed no nail polish to be worn day of surgery Patient instructed to bring photo id and insurance card day of surgery Patient aware to have Driver (ride ) / caregiver  for 24 hours after surgery -- husband, eddie Patient Special Instructions ----- asked to bring rescue inhaler dos Pre-Op special Istructions ----- n/a Patient verbalized understanding of instructions that were given at this phone interview. Patient denies shortness of breath, chest pain, fever, cough at this phone interview.   Anesthesia Review:  CAD 1V;  COPD/ emphysema (no supplemental oxygen), last exacerbation 02-26-2022 admission in epic, discharged no oxygen;   Pt denies cardiac s&s and sob with any activity.  PCP:  Bradly Bienenstock NP  [facility] 08-18-2023epic) Cardiologist : Dr Corky Downs ([place] 10-09-2020 epic) Pulmonologist:  Dr Elsworth Soho  [facility] 10-09-2021 epic) Neurologist:  Dr Krista Blue ([place] 05-29-2021) Chest x-ray : 02-26-2022/  CT 06-06-2021 EKG :  02-22-2022 Echo : 01-09-2019 Stress test: nuclear 04-20-2012 Cardiac Cath :  04-28-2019  Blood Thinner/ Instructions /Last Dose: no ASA / Instructions/ Last Dose : ASA '81mg'$ /  pt added on today.  Will not take am dos.

## 2022-06-02 ENCOUNTER — Encounter (HOSPITAL_BASED_OUTPATIENT_CLINIC_OR_DEPARTMENT_OTHER)
Admission: RE | Disposition: A | Discharge status: Home / Self Care | Payer: Self-pay | Source: Home / Self Care | Attending: Orthopedic Surgery

## 2022-06-02 ENCOUNTER — Encounter (HOSPITAL_BASED_OUTPATIENT_CLINIC_OR_DEPARTMENT_OTHER): Payer: Self-pay | Admitting: Orthopedic Surgery

## 2022-06-02 ENCOUNTER — Ambulatory Visit (HOSPITAL_BASED_OUTPATIENT_CLINIC_OR_DEPARTMENT_OTHER): Payer: BC Managed Care – PPO

## 2022-06-02 ENCOUNTER — Ambulatory Visit (HOSPITAL_BASED_OUTPATIENT_CLINIC_OR_DEPARTMENT_OTHER): Payer: BC Managed Care – PPO | Admitting: Anesthesiology

## 2022-06-02 ENCOUNTER — Other Ambulatory Visit: Payer: Self-pay

## 2022-06-02 ENCOUNTER — Ambulatory Visit (HOSPITAL_BASED_OUTPATIENT_CLINIC_OR_DEPARTMENT_OTHER)
Admission: RE | Admit: 2022-06-02 | Discharge: 2022-06-02 | Disposition: A | Discharge status: Home / Self Care | Payer: BC Managed Care – PPO | Attending: Orthopedic Surgery | Admitting: Orthopedic Surgery

## 2022-06-02 ENCOUNTER — Other Ambulatory Visit: Payer: Self-pay | Admitting: Cardiovascular Disease

## 2022-06-02 DIAGNOSIS — D759 Disease of blood and blood-forming organs, unspecified: Secondary | ICD-10-CM | POA: Diagnosis not present

## 2022-06-02 DIAGNOSIS — I2510 Atherosclerotic heart disease of native coronary artery without angina pectoris: Secondary | ICD-10-CM | POA: Insufficient documentation

## 2022-06-02 DIAGNOSIS — S52502A Unspecified fracture of the lower end of left radius, initial encounter for closed fracture: Secondary | ICD-10-CM | POA: Diagnosis not present

## 2022-06-02 DIAGNOSIS — K219 Gastro-esophageal reflux disease without esophagitis: Secondary | ICD-10-CM | POA: Diagnosis not present

## 2022-06-02 DIAGNOSIS — D649 Anemia, unspecified: Secondary | ICD-10-CM | POA: Insufficient documentation

## 2022-06-02 DIAGNOSIS — N183 Chronic kidney disease, stage 3 unspecified: Secondary | ICD-10-CM | POA: Diagnosis not present

## 2022-06-02 DIAGNOSIS — G8918 Other acute postprocedural pain: Secondary | ICD-10-CM | POA: Diagnosis not present

## 2022-06-02 DIAGNOSIS — F419 Anxiety disorder, unspecified: Secondary | ICD-10-CM | POA: Diagnosis not present

## 2022-06-02 DIAGNOSIS — X58XXXA Exposure to other specified factors, initial encounter: Secondary | ICD-10-CM | POA: Diagnosis not present

## 2022-06-02 DIAGNOSIS — Z01818 Encounter for other preprocedural examination: Secondary | ICD-10-CM

## 2022-06-02 DIAGNOSIS — E039 Hypothyroidism, unspecified: Secondary | ICD-10-CM | POA: Diagnosis not present

## 2022-06-02 DIAGNOSIS — F17200 Nicotine dependence, unspecified, uncomplicated: Secondary | ICD-10-CM | POA: Diagnosis not present

## 2022-06-02 DIAGNOSIS — S62102A Fracture of unspecified carpal bone, left wrist, initial encounter for closed fracture: Secondary | ICD-10-CM | POA: Diagnosis not present

## 2022-06-02 HISTORY — DX: Spondylolysis, cervical region: M43.02

## 2022-06-02 HISTORY — DX: Opioid dependence, uncomplicated: F11.20

## 2022-06-02 HISTORY — PX: ORIF WRIST FRACTURE: SHX2133

## 2022-06-02 HISTORY — DX: Atherosclerotic heart disease of native coronary artery without angina pectoris: I25.10

## 2022-06-02 HISTORY — DX: Cervicalgia: M54.2

## 2022-06-02 HISTORY — DX: Nicotine dependence, cigarettes, uncomplicated: F17.210

## 2022-06-02 HISTORY — DX: Spondylosis without myelopathy or radiculopathy, lumbar region: M47.816

## 2022-06-02 LAB — POCT I-STAT, CHEM 8
BUN: 14 mg/dL (ref 6–20)
Calcium, Ion: 1.22 mmol/L (ref 1.15–1.40)
Chloride: 105 mmol/L (ref 98–111)
Creatinine, Ser: 0.7 mg/dL (ref 0.44–1.00)
Glucose, Bld: 79 mg/dL (ref 70–99)
HCT: 34 % — ABNORMAL LOW (ref 36.0–46.0)
Hemoglobin: 11.6 g/dL — ABNORMAL LOW (ref 12.0–15.0)
Potassium: 4.1 mmol/L (ref 3.5–5.1)
Sodium: 142 mmol/L (ref 135–145)
TCO2: 26 mmol/L (ref 22–32)

## 2022-06-02 SURGERY — OPEN REDUCTION INTERNAL FIXATION (ORIF) WRIST FRACTURE
Anesthesia: General | Site: Wrist | Laterality: Left

## 2022-06-02 MED ORDER — ACETAMINOPHEN 500 MG PO TABS
ORAL_TABLET | ORAL | Status: AC
  Filled 2022-06-02: qty 2

## 2022-06-02 MED ORDER — ROPIVACAINE HCL 5 MG/ML IJ SOLN
INTRAMUSCULAR | Status: DC | PRN
  Administered 2022-06-02: 5 mL via PERINEURAL
  Administered 2022-06-02: 3 mL via PERINEURAL
  Administered 2022-06-02 (×2): 2 mL via PERINEURAL
  Administered 2022-06-02: 5 mL via PERINEURAL
  Administered 2022-06-02: 3 mL via PERINEURAL

## 2022-06-02 MED ORDER — PHENYLEPHRINE 80 MCG/ML (10ML) SYRINGE FOR IV PUSH (FOR BLOOD PRESSURE SUPPORT)
PREFILLED_SYRINGE | INTRAVENOUS | Status: DC | PRN
  Administered 2022-06-02 (×2): 80 ug via INTRAVENOUS

## 2022-06-02 MED ORDER — EPHEDRINE SULFATE-NACL 50-0.9 MG/10ML-% IV SOSY
PREFILLED_SYRINGE | INTRAVENOUS | Status: DC | PRN
  Administered 2022-06-02: 5 mg via INTRAVENOUS
  Administered 2022-06-02: 10 mg via INTRAVENOUS

## 2022-06-02 MED ORDER — POVIDONE-IODINE 10 % EX SWAB: 2.0000 | Freq: Once | CUTANEOUS | Status: DC

## 2022-06-02 MED ORDER — LIDOCAINE HCL (PF) 2 % IJ SOLN
INTRAMUSCULAR | Status: AC
  Filled 2022-06-02: qty 5

## 2022-06-02 MED ORDER — CEFAZOLIN SODIUM-DEXTROSE 2-4 GM/100ML-% IV SOLN
INTRAVENOUS | Status: AC
  Filled 2022-06-02: qty 100

## 2022-06-02 MED ORDER — ACETAMINOPHEN 500 MG PO TABS: 1000.0000 mg | ORAL_TABLET | Freq: Three times a day (TID) | ORAL | 0 refills | Status: DC | PRN

## 2022-06-02 MED ORDER — MELOXICAM 15 MG PO TABS: 15.0000 mg | ORAL_TABLET | Freq: Every day | ORAL | 0 refills | Status: DC

## 2022-06-02 MED ORDER — SODIUM CHLORIDE 0.9 % IV SOLN: INTRAVENOUS | Status: DC

## 2022-06-02 MED ORDER — MIDAZOLAM HCL 2 MG/2ML IJ SOLN
INTRAMUSCULAR | Status: AC
  Filled 2022-06-02: qty 2

## 2022-06-02 MED ORDER — TRANEXAMIC ACID-NACL 1000-0.7 MG/100ML-% IV SOLN
1000.0000 mg | INTRAVENOUS | Status: AC
  Administered 2022-06-02: 1000 mg via INTRAVENOUS

## 2022-06-02 MED ORDER — LIDOCAINE 2% (20 MG/ML) 5 ML SYRINGE
INTRAMUSCULAR | Status: DC | PRN
  Administered 2022-06-02: 60 mg via INTRAVENOUS

## 2022-06-02 MED ORDER — ONDANSETRON HCL 4 MG/2ML IJ SOLN
INTRAMUSCULAR | Status: DC | PRN
  Administered 2022-06-02: 4 mg via INTRAVENOUS

## 2022-06-02 MED ORDER — ACETAMINOPHEN 500 MG PO TABS
1000.0000 mg | ORAL_TABLET | Freq: Once | ORAL | Status: AC
  Administered 2022-06-02: 1000 mg via ORAL

## 2022-06-02 MED ORDER — MIDAZOLAM HCL 2 MG/2ML IJ SOLN
2.0000 mg | Freq: Once | INTRAMUSCULAR | Status: AC
  Administered 2022-06-02: 2 mg via INTRAVENOUS

## 2022-06-02 MED ORDER — CEFAZOLIN SODIUM-DEXTROSE 2-4 GM/100ML-% IV SOLN
2.0000 g | INTRAVENOUS | Status: AC
  Administered 2022-06-02: 2 g via INTRAVENOUS

## 2022-06-02 MED ORDER — FENTANYL CITRATE (PF) 100 MCG/2ML IJ SOLN
100.0000 ug | Freq: Once | INTRAMUSCULAR | Status: AC
  Administered 2022-06-02: 100 ug via INTRAVENOUS

## 2022-06-02 MED ORDER — OXYCODONE HCL 5 MG PO TABS: 5.0000 mg | ORAL_TABLET | Freq: Three times a day (TID) | ORAL | 0 refills | Status: DC | PRN

## 2022-06-02 MED ORDER — 0.9 % SODIUM CHLORIDE (POUR BTL) OPTIME
TOPICAL | Status: DC | PRN
  Administered 2022-06-02: 500 mL

## 2022-06-02 MED ORDER — DEXAMETHASONE SODIUM PHOSPHATE 10 MG/ML IJ SOLN: 8.0000 mg | Freq: Once | INTRAMUSCULAR | Status: DC

## 2022-06-02 MED ORDER — PROPOFOL 10 MG/ML IV BOLUS
INTRAVENOUS | Status: DC | PRN
  Administered 2022-06-02: 150 mg via INTRAVENOUS

## 2022-06-02 MED ORDER — EPHEDRINE 5 MG/ML INJ
INTRAVENOUS | Status: AC
  Filled 2022-06-02: qty 5

## 2022-06-02 MED ORDER — ONDANSETRON HCL 4 MG/2ML IJ SOLN
INTRAMUSCULAR | Status: AC
  Filled 2022-06-02: qty 2

## 2022-06-02 MED ORDER — FENTANYL CITRATE (PF) 100 MCG/2ML IJ SOLN
INTRAMUSCULAR | Status: AC
  Filled 2022-06-02: qty 2

## 2022-06-02 MED ORDER — TRANEXAMIC ACID-NACL 1000-0.7 MG/100ML-% IV SOLN
INTRAVENOUS | Status: AC
  Filled 2022-06-02: qty 100

## 2022-06-02 MED ORDER — ONDANSETRON 4 MG PO TBDP: 4.0000 mg | ORAL_TABLET | Freq: Two times a day (BID) | ORAL | 0 refills | Status: DC | PRN

## 2022-06-02 MED ORDER — PHENYLEPHRINE 80 MCG/ML (10ML) SYRINGE FOR IV PUSH (FOR BLOOD PRESSURE SUPPORT)
PREFILLED_SYRINGE | INTRAVENOUS | Status: AC
  Filled 2022-06-02: qty 10

## 2022-06-02 SURGICAL SUPPLY — 60 items
APL PRP STRL LF DISP 70% ISPRP (MISCELLANEOUS) ×1
BIT DRILL 2.2 SS TIBIAL (BIT) IMPLANT
BLADE SURG 15 STRL LF DISP TIS (BLADE) ×2 IMPLANT
BLADE SURG 15 STRL SS (BLADE) ×1
BNDG CMPR 9X4 STRL LF SNTH (GAUZE/BANDAGES/DRESSINGS) ×1
BNDG ELASTIC 4X5.8 VLCR STR LF (GAUZE/BANDAGES/DRESSINGS) ×1 IMPLANT
BNDG ESMARK 4X9 LF (GAUZE/BANDAGES/DRESSINGS) ×1 IMPLANT
CHLORAPREP W/TINT 26 (MISCELLANEOUS) ×1 IMPLANT
CORD BIPOLAR FORCEPS 12FT (ELECTRODE) ×1 IMPLANT
COVER BACK TABLE 60X90IN (DRAPES) ×1 IMPLANT
CUFF TOURN SGL QUICK 18X4 (TOURNIQUET CUFF) IMPLANT
DRAPE EXTREMITY T 121X128X90 (DISPOSABLE) ×1 IMPLANT
DRAPE IMP U-DRAPE 54X76 (DRAPES) ×1 IMPLANT
DRAPE SURG 17X23 STRL (DRAPES) IMPLANT
GAUZE 4X4 16PLY ~~LOC~~+RFID DBL (SPONGE) ×1 IMPLANT
GAUZE SPONGE 4X4 12PLY STRL (GAUZE/BANDAGES/DRESSINGS) ×1 IMPLANT
GAUZE XEROFORM 1X8 LF (GAUZE/BANDAGES/DRESSINGS) IMPLANT
GLOVE BIO SURGEON STRL SZ 6.5 (GLOVE) IMPLANT
GLOVE BIO SURGEON STRL SZ7.5 (GLOVE) ×2 IMPLANT
GLOVE BIOGEL PI IND STRL 6.5 (GLOVE) IMPLANT
GLOVE BIOGEL PI IND STRL 7.0 (GLOVE) IMPLANT
GLOVE BIOGEL PI IND STRL 7.5 (GLOVE) ×1 IMPLANT
GLOVE BIOGEL PI IND STRL 8 (GLOVE) ×2 IMPLANT
GLOVE SURG SS PI 7.5 STRL IVOR (GLOVE) ×1 IMPLANT
GOWN STRL REUS W/ TWL LRG LVL3 (GOWN DISPOSABLE) IMPLANT
GOWN STRL REUS W/TWL LRG LVL3 (GOWN DISPOSABLE) ×3 IMPLANT
GOWN STRL REUS W/TWL XL LVL3 (GOWN DISPOSABLE) ×1 IMPLANT
K-WIRE 1.6 (WIRE) ×2
K-WIRE FX5X1.6XNS BN SS (WIRE) ×2
KIT TURNOVER CYSTO (KITS) ×1 IMPLANT
KWIRE FX5X1.6XNS BN SS (WIRE) IMPLANT
NDL HYPO 25X1 1.5 SAFETY (NEEDLE) ×1 IMPLANT
NEEDLE HYPO 25X1 1.5 SAFETY (NEEDLE) ×1 IMPLANT
NS IRRIG 500ML POUR BTL (IV SOLUTION) IMPLANT
PACK BASIN DAY SURGERY FS (CUSTOM PROCEDURE TRAY) ×1 IMPLANT
PAD CAST 4YDX4 CTTN HI CHSV (CAST SUPPLIES) ×1 IMPLANT
PAD CAST CTTN 4X4 STRL (SOFTGOODS) IMPLANT
PADDING CAST ABS COTTON 4X4 ST (CAST SUPPLIES) ×1 IMPLANT
PADDING CAST COTTON 4X4 STRL (CAST SUPPLIES) ×1
PADDING CAST COTTON 4X4 STRL (SOFTGOODS) ×1
PEG LOCKING SMOOTH 2.2X18 (Peg) IMPLANT
PEG LOCKING SMOOTH 2.2X20 (Screw) IMPLANT
PLATE STANDARD DVR LEFT (Plate) ×1 IMPLANT
PLATE STD DVR LT 24X51 (Plate) IMPLANT
SCREW LOCK 14X2.7X 3 LD TPR (Screw) IMPLANT
SCREW LOCK 16X2.7X 3 LD TPR (Screw) IMPLANT
SCREW LOCKING 2.7X14 (Screw) ×1 IMPLANT
SCREW LOCKING 2.7X15MM (Screw) IMPLANT
SCREW LOCKING 2.7X16 (Screw) ×1 IMPLANT
SLEEVE SCD COMPRESS KNEE MED (STOCKING) IMPLANT
SPLINT PLASTER CAST FAST 5X30 (CAST SUPPLIES) IMPLANT
SPONGE T-LAP 4X18 ~~LOC~~+RFID (SPONGE) ×1 IMPLANT
STRIP CLOSURE SKIN 1/2X4 (GAUZE/BANDAGES/DRESSINGS) ×1 IMPLANT
SUT MNCRL AB 3-0 PS2 18 (SUTURE) IMPLANT
SUT MON AB 2-0 CT1 36 (SUTURE) ×1 IMPLANT
SYR BULB EAR ULCER 3OZ GRN STR (SYRINGE) ×1 IMPLANT
SYR CONTROL 10ML LL (SYRINGE) ×1 IMPLANT
TOWEL OR 17X26 10 PK STRL BLUE (TOWEL DISPOSABLE) ×1 IMPLANT
TUBE CONNECTING 12X1/4 (SUCTIONS) ×1 IMPLANT
UNDERPAD 30X36 HEAVY ABSORB (UNDERPADS AND DIAPERS) ×1 IMPLANT

## 2022-06-02 NOTE — Transfer of Care (Signed)
Immediate Anesthesia Transfer of Care Note  Patient: Stacy Fernandez  Procedure(s) Performed: Procedure(s) (LRB): OPEN REDUCTION INTERNAL FIXATION (ORIF) WRIST FRACTURE (Left)  Patient Location: PACU  Anesthesia Type: General  Level of Consciousness: awake, oriented, sedated and patient cooperative  Airway & Oxygen Therapy: Patient Spontanous Breathing and Patient connected to face mask oxygen  Post-op Assessment: Report given to PACU RN and Post -op Vital signs reviewed and stable  Post vital signs: Reviewed and stable  Complications: No apparent anesthesia complications Vitals Value Taken Time  BP 99/66 06/02/22 1248  Temp    Pulse 65 06/02/22 1251  Resp 12 06/02/22 1251  SpO2 100 % 06/02/22 1251  Vitals shown include unvalidated device data.  Last Pain:  Vitals:   06/02/22 0934  TempSrc: Oral  PainSc: 4       Patients Stated Pain Goal: 4 (24/49/75 3005)  Complications: No notable events documented.

## 2022-06-02 NOTE — Discharge Instructions (Addendum)
POST-OPERATIVE OPIOID TAPER INSTRUCTIONS: It is important to wean off of your opioid medication as soon as possible. If you do not need pain medication after your surgery it is ok to stop day one. Opioids include: Codeine, Hydrocodone(Norco, Vicodin), Oxycodone(Percocet, oxycontin) and hydromorphone amongst others.  Long term and even short term use of opiods can cause: Increased pain response Dependence Constipation Depression Respiratory depression And more.  Withdrawal symptoms can include Flu like symptoms Nausea, vomiting And more Techniques to manage these symptoms Hydrate well Eat regular healthy meals Stay active Use relaxation techniques(deep breathing, meditating, yoga) Do Not substitute Alcohol to help with tapering If you have been on opioids for less than two weeks and do not have pain than it is ok to stop all together.  Plan to wean off of opioids This plan should start within one week post op of your joint replacement. Maintain the same interval or time between taking each dose and first decrease the dose.  Cut the total daily intake of opioids by one tablet each day Next start to increase the time between doses. The last dose that should be eliminated is the evening dose.   Post Anesthesia Home Care Instructions  Activity: Get plenty of rest for the remainder of the day. A responsible adult should stay with you for 24 hours following the procedure.  For the next 24 hours, DO NOT: -Drive a car -Paediatric nurse -Drink alcoholic beverages -Take any medication unless instructed by your physician -Make any legal decisions or sign important papers.  Meals: Start with liquid foods such as gelatin or soup. Progress to regular foods as tolerated. Avoid greasy, spicy, heavy foods. If nausea and/or vomiting occur, drink only clear liquids until the nausea and/or vomiting subsides. Call your physician if vomiting continues.  Special Instructions/Symptoms: Your throat  may feel dry or sore from the anesthesia or the breathing tube placed in your throat during surgery. If this causes discomfort, gargle with warm salt water. The discomfort should disappear within 24 hours.  Regional Anesthesia Blocks  1. Numbness or the inability to move the "blocked" extremity may last from 3-48 hours after placement. The length of time depends on the medication injected and your individual response to the medication. If the numbness is not going away after 48 hours, call your surgeon.  2. The extremity that is blocked will need to be protected until the numbness is gone and the  Strength has returned. Because you cannot feel it, you will need to take extra care to avoid injury. Because it may be weak, you may have difficulty moving it or using it. You may not know what position it is in without looking at it while the block is in effect.  3. For blocks in the legs and feet, returning to weight bearing and walking needs to be done carefully. You will need to wait until the numbness is entirely gone and the strength has returned. You should be able to move your leg and foot normally before you try and bear weight or walk. You will need someone to be with you when you first try to ensure you do not fall and possibly risk injury.  4. Bruising and tenderness at the needle site are common side effects and will resolve in a few days.  5. Persistent numbness or new problems with movement should be communicated to the surgeon or the [place] [phone] [place] [phone]).

## 2022-06-02 NOTE — Anesthesia Postprocedure Evaluation (Signed)
Anesthesia Post Note  Patient: Stacy Fernandez  Procedure(s) Performed: OPEN REDUCTION INTERNAL FIXATION (ORIF) WRIST FRACTURE (Left: Wrist)     Patient location during evaluation: Phase II Anesthesia Type: General Level of consciousness: awake Pain management: pain level controlled Vital Signs Assessment: post-procedure vital signs reviewed and stable Respiratory status: spontaneous breathing Cardiovascular status: stable Postop Assessment: no apparent nausea or vomiting Anesthetic complications: no  No notable events documented.  Last Vitals:  Vitals:   06/02/22 1335 06/02/22 1417  BP:  (!) 92/52  Pulse: 67 65  Resp: 14 19  Temp: 36.4 C   SpO2: 98% 96%    Last Pain:  Vitals:   06/02/22 1330  TempSrc:   PainSc: 0-No pain                 Huston Foley

## 2022-06-02 NOTE — Anesthesia Preprocedure Evaluation (Addendum)
Anesthesia Evaluation  Patient identified by MRN, date of birth, ID band Patient awake    Reviewed: Allergy & Precautions, NPO status , Patient's Chart, lab work & pertinent test results, reviewed documented beta blocker date and time   Airway Mallampati: I       Dental no notable dental hx.    Pulmonary Current Smoker and Patient abstained from smoking.   Pulmonary exam normal        Cardiovascular + CAD  Normal cardiovascular exam     Neuro/Psych  Headaches PSYCHIATRIC DISORDERS Anxiety        GI/Hepatic Neg liver ROS,GERD  Medicated and Controlled,,  Endo/Other  Hypothyroidism    Renal/GU Renal diseaseCKD III  negative genitourinary   Musculoskeletal   Abdominal Normal abdominal exam  (+)   Peds  Hematology  (+) Blood dyscrasia, anemia   Anesthesia Other Findings Conclusion     Prox LAD to Mid LAD lesion is 30% stenosed.  Mid LAD-1 lesion is 60% stenosed.  Mid LAD-2 lesion is 10% stenosed.  The left ventricular systolic function is normal.  LV end diastolic pressure is low.  1st Diag lesion is 20% stenosed.   Single-vessel CAD with calcification of the proximal to mid LAD with 20% ostial diagonal stenosis with 30% stenosis of the LAD immediately after the diagonal vessel, 60% smooth mildly calcified stenosis of the LAD in a slight bend in the vessel proximal to the second diagonal takeoff with mild smooth 10% LAD stenosis in the mid LAD.  The LAD is a large caliber and wraps around the LV apex.   Normal left circumflex and dominant RCA.   No LV function with EF estimate at least 55%.  LVEDP 9 mm.     Reproductive/Obstetrics                              Anesthesia Physical Anesthesia Plan  ASA: 2  Anesthesia Plan: General   Post-op Pain Management: Regional block*   Induction: Intravenous  PONV Risk Score and Plan: 2 and Ondansetron and Midazolam  Airway Management  Planned: LMA  Additional Equipment: None  Intra-op Plan:   Post-operative Plan: Extubation in OR  Informed Consent: I have reviewed the patients History and Physical, chart, labs and discussed the procedure including the risks, benefits and alternatives for the proposed anesthesia with the patient or authorized representative who has indicated his/her understanding and acceptance.     Dental advisory given  Plan Discussed with: CRNA  Anesthesia Plan Comments:          Anesthesia Quick Evaluation

## 2022-06-02 NOTE — Anesthesia Procedure Notes (Addendum)
Anesthesia Regional Block: Supraclavicular block   Pre-Anesthetic Checklist: , timeout performed,  Correct Patient, Correct Site, Correct Laterality,  Correct Procedure, Correct Position, site marked,  Risks and benefits discussed,  Surgical consent,  Pre-op evaluation,  At surgeon's request and post-op pain management  Laterality: Upper and Left  Prep: chloraprep       Needles:  Injection technique: Single-shot  Needle Type: Echogenic Stimulator Needle     Needle Length: 9cm  Needle Gauge: 20   Needle insertion depth: 2 cm   Additional Needles:   Procedures:,,,, ultrasound used (permanent image in chart),,    Narrative:  Start time: 06/02/2022 11:25 AM End time: 06/02/2022 11:35 AM Injection made incrementally with aspirations every 5 mL.  Performed by: Personally  Anesthesiologist: Lyn Hollingshead, MD

## 2022-06-02 NOTE — Interval H&P Note (Signed)
History and Physical Interval Note:  06/02/2022 10:17 AM  Stacy Fernandez  has presented today for surgery, with the diagnosis of LEFT WRIST FRACTURE.  The various methods of treatment have been discussed with the patient and family. After consideration of risks, benefits and other options for treatment, the patient has consented to  Procedure(s): OPEN REDUCTION INTERNAL FIXATION (ORIF) WRIST FRACTURE (Left) as a surgical intervention.  The patient's history has been reviewed, patient examined, no change in status, stable for surgery.  I have reviewed the patient's chart and labs.  Questions were answered to the patient's satisfaction.     Renette Butters

## 2022-06-02 NOTE — Op Note (Signed)
06/02/2022  12:57 PM  PATIENT:  [name]    PRE-OPERATIVE DIAGNOSIS:  LEFT WRIST FRACTURE  POST-OPERATIVE DIAGNOSIS:  Same  PROCEDURE:  OPEN REDUCTION INTERNAL FIXATION (ORIF) WRIST FRACTURE  SURGEON:  Renette Butters, MD  ASSISTANT: Aggie Moats, PA-C, he was present and scrubbed throughout the case, critical for completion in a timely fashion, and for retraction, instrumentation, and closure.   ANESTHESIA:   gen  PREOPERATIVE INDICATIONS:  Na Waldrip is a  61 y.o. female with a diagnosis of LEFT WRIST FRACTURE who failed conservative measures and elected for surgical management.    The risks benefits and alternatives were discussed with the patient preoperatively including but not limited to the risks of infection, bleeding, nerve injury, cardiopulmonary complications, the need for revision surgery, among others, and the patient was willing to proceed.  OPERATIVE IMPLANTS: DVR plate  OPERATIVE FINDINGS: unstable fx  BLOOD LOSS: min  COMPLICATIONS: none  TOURNIQUET TIME: 89mn  OPERATIVE PROCEDURE:  Patient was identified in the preoperative holding area and site was marked by me She was transported to the operating theater and placed on the table in supine position taking care to pad all bony prominences. After a preincinduction time out anesthesia was induced. The left upper extremity was prepped and draped in normal sterile fashion and a pre-incision timeout was performed. She received ancef for preoperative antibiotics.   I made a 5 cm incision centered over her FCR tendon and dissected down carefully to the level of the flexor tendon sheath and incise this longitudinally and retracted the FCR radially and incised the dorsal aspect of the sheath.   I bluntly dissected the FPL muscle belly away from the brachioradialis and then sharply incised the pronator tendon from the distal radius and from the wrist capsule. I Elevated this off the bone the fractures  visible.   I released the brachioradialis from its insertion. I then debrided the fracture and performed a manual reduction. There were at least 3 articular pieces of the fracture.  I selected a plate and I placed it on the bone. I pinned it into place and was happy on multiple radiographic views with it's placement. I then fixed the plate distally with the locking pegs. I confirmed no articular penetration with the pegs and that none were prominent dorsally.   I then reduced the plate to the shaft improving the volar and radial tilt of her distal radius.  I was happy with the final fluoro xrays. I reviewed more than 3 views of the wrist including obliques and ap/lat   I thoroughly irrigated the wound and closed the pronator over top of the plate and then closed the skin in layers with absorbable stitch. Sterile dressing was applied using the PACU in stable condition.  POST OPERATIVE PLAN: NWB, Splint full time. Ambulate for DVT px.

## 2022-06-02 NOTE — Progress Notes (Signed)
Assisted Dr. Hatchett with left, interscalene , ultrasound guided block. Side rails up, monitors on throughout procedure. See vital signs in flow sheet. Tolerated Procedure well. 

## 2022-06-02 NOTE — Anesthesia Procedure Notes (Signed)
Procedure Name: LMA Insertion Date/Time: 06/02/2022 11:47 AM  Performed by: Suan Halter, CRNAPre-anesthesia Checklist: Patient identified, Emergency Drugs available, Suction available and Patient being monitored Patient Re-evaluated:Patient Re-evaluated prior to induction Oxygen Delivery Method: Circle system utilized Preoxygenation: Pre-oxygenation with 100% oxygen Induction Type: IV induction Ventilation: Mask ventilation without difficulty LMA: LMA inserted LMA Size: 4.0 Number of attempts: 1 Airway Equipment and Method: Bite block Placement Confirmation: positive ETCO2 Tube secured with: Tape Dental Injury: Teeth and Oropharynx as per pre-operative assessment

## 2022-06-03 ENCOUNTER — Encounter (HOSPITAL_BASED_OUTPATIENT_CLINIC_OR_DEPARTMENT_OTHER): Payer: Self-pay | Admitting: Orthopedic Surgery

## 2022-06-03 ENCOUNTER — Other Ambulatory Visit: Payer: Self-pay | Admitting: Cardiovascular Disease

## 2022-06-08 ENCOUNTER — Ambulatory Visit (HOSPITAL_BASED_OUTPATIENT_CLINIC_OR_DEPARTMENT_OTHER)
Admission: RE | Admit: 2022-06-08 | Discharge: 2022-06-08 | Disposition: A | Discharge status: Home / Self Care | Payer: BC Managed Care – PPO | Source: Ambulatory Visit | Attending: Acute Care | Admitting: Acute Care

## 2022-06-08 DIAGNOSIS — I2510 Atherosclerotic heart disease of native coronary artery without angina pectoris: Secondary | ICD-10-CM | POA: Diagnosis not present

## 2022-06-08 DIAGNOSIS — I700 Atherosclerosis of aorta: Secondary | ICD-10-CM | POA: Diagnosis not present

## 2022-06-08 DIAGNOSIS — Z122 Encounter for screening for malignant neoplasm of respiratory organs: Secondary | ICD-10-CM | POA: Insufficient documentation

## 2022-06-08 DIAGNOSIS — F17210 Nicotine dependence, cigarettes, uncomplicated: Secondary | ICD-10-CM | POA: Insufficient documentation

## 2022-06-08 DIAGNOSIS — J439 Emphysema, unspecified: Secondary | ICD-10-CM | POA: Diagnosis not present

## 2022-06-08 DIAGNOSIS — N200 Calculus of kidney: Secondary | ICD-10-CM | POA: Diagnosis not present

## 2022-06-08 DIAGNOSIS — Z87891 Personal history of nicotine dependence: Secondary | ICD-10-CM

## 2022-06-08 DIAGNOSIS — F17200 Nicotine dependence, unspecified, uncomplicated: Secondary | ICD-10-CM

## 2022-06-10 ENCOUNTER — Other Ambulatory Visit: Payer: Self-pay

## 2022-06-10 DIAGNOSIS — Z87891 Personal history of nicotine dependence: Secondary | ICD-10-CM

## 2022-06-10 DIAGNOSIS — F17210 Nicotine dependence, cigarettes, uncomplicated: Secondary | ICD-10-CM

## 2022-06-10 DIAGNOSIS — Z122 Encounter for screening for malignant neoplasm of respiratory organs: Secondary | ICD-10-CM

## 2022-06-15 DIAGNOSIS — S52502D Unspecified fracture of the lower end of left radius, subsequent encounter for closed fracture with routine healing: Secondary | ICD-10-CM | POA: Diagnosis not present

## 2022-06-18 ENCOUNTER — Other Ambulatory Visit: Payer: Self-pay | Admitting: Cardiovascular Disease

## 2022-07-01 DIAGNOSIS — M542 Cervicalgia: Secondary | ICD-10-CM | POA: Diagnosis not present

## 2022-07-01 DIAGNOSIS — M47812 Spondylosis without myelopathy or radiculopathy, cervical region: Secondary | ICD-10-CM | POA: Diagnosis not present

## 2022-07-09 ENCOUNTER — Other Ambulatory Visit: Payer: Self-pay | Admitting: Neurology

## 2022-07-09 ENCOUNTER — Other Ambulatory Visit: Payer: Self-pay | Admitting: Cardiovascular Disease

## 2022-07-16 DIAGNOSIS — R0981 Nasal congestion: Secondary | ICD-10-CM | POA: Diagnosis not present

## 2022-07-16 DIAGNOSIS — F418 Other specified anxiety disorders: Secondary | ICD-10-CM | POA: Diagnosis not present

## 2022-07-16 DIAGNOSIS — J0100 Acute maxillary sinusitis, unspecified: Secondary | ICD-10-CM | POA: Diagnosis not present

## 2022-07-16 DIAGNOSIS — G43709 Chronic migraine without aura, not intractable, without status migrainosus: Secondary | ICD-10-CM | POA: Diagnosis not present

## 2022-07-22 DIAGNOSIS — S52502D Unspecified fracture of the lower end of left radius, subsequent encounter for closed fracture with routine healing: Secondary | ICD-10-CM | POA: Diagnosis not present

## 2022-07-28 DIAGNOSIS — J069 Acute upper respiratory infection, unspecified: Secondary | ICD-10-CM | POA: Diagnosis not present

## 2022-07-28 DIAGNOSIS — J449 Chronic obstructive pulmonary disease, unspecified: Secondary | ICD-10-CM | POA: Diagnosis not present

## 2022-08-07 ENCOUNTER — Other Ambulatory Visit: Payer: Self-pay | Admitting: Neurology

## 2022-08-07 ENCOUNTER — Other Ambulatory Visit: Payer: Self-pay | Admitting: Cardiovascular Disease

## 2022-08-10 DIAGNOSIS — R319 Hematuria, unspecified: Secondary | ICD-10-CM | POA: Diagnosis not present

## 2022-08-10 DIAGNOSIS — N3001 Acute cystitis with hematuria: Secondary | ICD-10-CM | POA: Diagnosis not present

## 2022-08-10 DIAGNOSIS — R82998 Other abnormal findings in urine: Secondary | ICD-10-CM | POA: Diagnosis not present

## 2022-08-11 DIAGNOSIS — M542 Cervicalgia: Secondary | ICD-10-CM | POA: Diagnosis not present

## 2022-08-11 DIAGNOSIS — M47812 Spondylosis without myelopathy or radiculopathy, cervical region: Secondary | ICD-10-CM | POA: Diagnosis not present

## 2022-08-11 DIAGNOSIS — F1120 Opioid dependence, uncomplicated: Secondary | ICD-10-CM | POA: Diagnosis not present

## 2022-08-24 DIAGNOSIS — M47812 Spondylosis without myelopathy or radiculopathy, cervical region: Secondary | ICD-10-CM | POA: Diagnosis not present

## 2022-08-31 DIAGNOSIS — R310 Gross hematuria: Secondary | ICD-10-CM | POA: Diagnosis not present

## 2022-08-31 DIAGNOSIS — N399 Disorder of urinary system, unspecified: Secondary | ICD-10-CM | POA: Diagnosis not present

## 2022-08-31 DIAGNOSIS — N393 Stress incontinence (female) (male): Secondary | ICD-10-CM | POA: Diagnosis not present

## 2022-09-01 DIAGNOSIS — R310 Gross hematuria: Secondary | ICD-10-CM | POA: Diagnosis not present

## 2022-09-01 DIAGNOSIS — N393 Stress incontinence (female) (male): Secondary | ICD-10-CM | POA: Diagnosis not present

## 2022-09-01 DIAGNOSIS — R8289 Other abnormal findings on cytological and histological examination of urine: Secondary | ICD-10-CM | POA: Diagnosis not present

## 2022-09-09 ENCOUNTER — Other Ambulatory Visit: Payer: Self-pay | Admitting: Cardiovascular Disease

## 2022-09-17 DIAGNOSIS — R310 Gross hematuria: Secondary | ICD-10-CM | POA: Diagnosis not present

## 2022-09-17 DIAGNOSIS — N132 Hydronephrosis with renal and ureteral calculous obstruction: Secondary | ICD-10-CM | POA: Diagnosis not present

## 2022-09-17 DIAGNOSIS — Z9049 Acquired absence of other specified parts of digestive tract: Secondary | ICD-10-CM | POA: Diagnosis not present

## 2022-09-17 DIAGNOSIS — I708 Atherosclerosis of other arteries: Secondary | ICD-10-CM | POA: Diagnosis not present

## 2022-09-25 DIAGNOSIS — N2889 Other specified disorders of kidney and ureter: Secondary | ICD-10-CM | POA: Diagnosis not present

## 2022-09-25 DIAGNOSIS — N201 Calculus of ureter: Secondary | ICD-10-CM | POA: Diagnosis not present

## 2022-09-25 DIAGNOSIS — N393 Stress incontinence (female) (male): Secondary | ICD-10-CM | POA: Diagnosis not present

## 2022-09-25 DIAGNOSIS — Z87442 Personal history of urinary calculi: Secondary | ICD-10-CM | POA: Diagnosis not present

## 2022-09-29 DIAGNOSIS — H04201 Unspecified epiphora, right lacrimal gland: Secondary | ICD-10-CM | POA: Diagnosis not present

## 2022-09-29 DIAGNOSIS — D4989 Neoplasm of unspecified behavior of other specified sites: Secondary | ICD-10-CM | POA: Diagnosis not present

## 2022-10-02 ENCOUNTER — Other Ambulatory Visit: Payer: Self-pay | Admitting: Cardiovascular Disease

## 2022-10-13 DIAGNOSIS — N393 Stress incontinence (female) (male): Secondary | ICD-10-CM | POA: Diagnosis not present

## 2022-10-13 DIAGNOSIS — N3281 Overactive bladder: Secondary | ICD-10-CM | POA: Diagnosis not present

## 2022-10-14 DIAGNOSIS — Z79890 Hormone replacement therapy (postmenopausal): Secondary | ICD-10-CM | POA: Diagnosis not present

## 2022-10-14 DIAGNOSIS — N132 Hydronephrosis with renal and ureteral calculous obstruction: Secondary | ICD-10-CM | POA: Diagnosis not present

## 2022-10-14 DIAGNOSIS — R9341 Abnormal radiologic findings on diagnostic imaging of renal pelvis, ureter, or bladder: Secondary | ICD-10-CM | POA: Diagnosis not present

## 2022-10-14 DIAGNOSIS — J449 Chronic obstructive pulmonary disease, unspecified: Secondary | ICD-10-CM | POA: Diagnosis not present

## 2022-10-14 DIAGNOSIS — Z79899 Other long term (current) drug therapy: Secondary | ICD-10-CM | POA: Diagnosis not present

## 2022-10-14 DIAGNOSIS — Z7951 Long term (current) use of inhaled steroids: Secondary | ICD-10-CM | POA: Diagnosis not present

## 2022-10-14 DIAGNOSIS — Z9049 Acquired absence of other specified parts of digestive tract: Secondary | ICD-10-CM | POA: Diagnosis not present

## 2022-10-14 DIAGNOSIS — N200 Calculus of kidney: Secondary | ICD-10-CM | POA: Diagnosis not present

## 2022-10-14 DIAGNOSIS — D259 Leiomyoma of uterus, unspecified: Secondary | ICD-10-CM | POA: Diagnosis not present

## 2022-10-14 DIAGNOSIS — K219 Gastro-esophageal reflux disease without esophagitis: Secondary | ICD-10-CM | POA: Diagnosis not present

## 2022-10-14 DIAGNOSIS — Z888 Allergy status to other drugs, medicaments and biological substances status: Secondary | ICD-10-CM | POA: Diagnosis not present

## 2022-10-14 DIAGNOSIS — E039 Hypothyroidism, unspecified: Secondary | ICD-10-CM | POA: Diagnosis not present

## 2022-10-14 DIAGNOSIS — Z853 Personal history of malignant neoplasm of breast: Secondary | ICD-10-CM | POA: Diagnosis not present

## 2022-10-14 DIAGNOSIS — Z7952 Long term (current) use of systemic steroids: Secondary | ICD-10-CM | POA: Diagnosis not present

## 2022-10-14 DIAGNOSIS — M47816 Spondylosis without myelopathy or radiculopathy, lumbar region: Secondary | ICD-10-CM | POA: Diagnosis not present

## 2022-10-14 DIAGNOSIS — Z466 Encounter for fitting and adjustment of urinary device: Secondary | ICD-10-CM | POA: Diagnosis not present

## 2022-10-14 DIAGNOSIS — G43709 Chronic migraine without aura, not intractable, without status migrainosus: Secondary | ICD-10-CM | POA: Diagnosis not present

## 2022-10-14 DIAGNOSIS — F1911 Other psychoactive substance abuse, in remission: Secondary | ICD-10-CM | POA: Diagnosis not present

## 2022-10-14 DIAGNOSIS — N1330 Unspecified hydronephrosis: Secondary | ICD-10-CM | POA: Diagnosis not present

## 2022-10-14 DIAGNOSIS — N202 Calculus of kidney with calculus of ureter: Secondary | ICD-10-CM | POA: Diagnosis not present

## 2022-10-14 DIAGNOSIS — I2510 Atherosclerotic heart disease of native coronary artery without angina pectoris: Secondary | ICD-10-CM | POA: Diagnosis not present

## 2022-10-14 DIAGNOSIS — Z91048 Other nonmedicinal substance allergy status: Secondary | ICD-10-CM | POA: Diagnosis not present

## 2022-10-14 DIAGNOSIS — Z7982 Long term (current) use of aspirin: Secondary | ICD-10-CM | POA: Diagnosis not present

## 2022-10-14 DIAGNOSIS — M47812 Spondylosis without myelopathy or radiculopathy, cervical region: Secondary | ICD-10-CM | POA: Diagnosis not present

## 2022-10-19 HISTORY — PX: CYSTOSCOPY: SHX5120

## 2022-10-22 ENCOUNTER — Other Ambulatory Visit: Payer: Self-pay | Admitting: Pulmonary Disease

## 2022-10-26 NOTE — Telephone Encounter (Signed)
Refill request for Trelegy.  Patient last OV 10/09/2021.  Per chart note:   refills on Trelegy x 1 year  Return in about 1 year (around 10/10/2022) for TP/ me. Will give one refill and patient notified needs OV for further refills.

## 2022-10-27 ENCOUNTER — Telehealth (HOSPITAL_BASED_OUTPATIENT_CLINIC_OR_DEPARTMENT_OTHER): Payer: Self-pay | Admitting: Pulmonary Disease

## 2022-10-27 NOTE — Telephone Encounter (Signed)
Okay to provide refills on Trelegyx 3 months with 3 refills  Please also schedule follow-up office appointment

## 2022-10-29 DIAGNOSIS — M47816 Spondylosis without myelopathy or radiculopathy, lumbar region: Secondary | ICD-10-CM | POA: Diagnosis not present

## 2022-10-29 DIAGNOSIS — M5136 Other intervertebral disc degeneration, lumbar region: Secondary | ICD-10-CM | POA: Diagnosis not present

## 2022-10-29 MED ORDER — TRELEGY ELLIPTA 100-62.5-25 MCG/ACT IN AEPB: 1.0000 | INHALATION_SPRAY | Freq: Every day | RESPIRATORY_TRACT | 3 refills | Status: DC

## 2022-10-29 NOTE — Telephone Encounter (Signed)
Per DPR left pt a detailed vm letting her know her trelegy has been sent to the pharmacy and to give [place] a call to get an appointment scheduled with [place] for a follow up.   Nothing further needed.

## 2022-11-05 DIAGNOSIS — Z01812 Encounter for preprocedural laboratory examination: Secondary | ICD-10-CM | POA: Diagnosis not present

## 2022-11-05 DIAGNOSIS — N202 Calculus of kidney with calculus of ureter: Secondary | ICD-10-CM | POA: Diagnosis not present

## 2022-11-05 DIAGNOSIS — M5136 Other intervertebral disc degeneration, lumbar region: Secondary | ICD-10-CM | POA: Diagnosis not present

## 2022-11-05 DIAGNOSIS — M47812 Spondylosis without myelopathy or radiculopathy, cervical region: Secondary | ICD-10-CM | POA: Diagnosis not present

## 2022-11-05 DIAGNOSIS — N3090 Cystitis, unspecified without hematuria: Secondary | ICD-10-CM | POA: Diagnosis not present

## 2022-11-05 DIAGNOSIS — E039 Hypothyroidism, unspecified: Secondary | ICD-10-CM | POA: Diagnosis not present

## 2022-11-05 DIAGNOSIS — M47816 Spondylosis without myelopathy or radiculopathy, lumbar region: Secondary | ICD-10-CM | POA: Diagnosis not present

## 2022-11-05 DIAGNOSIS — I2510 Atherosclerotic heart disease of native coronary artery without angina pectoris: Secondary | ICD-10-CM | POA: Diagnosis not present

## 2022-11-05 DIAGNOSIS — J449 Chronic obstructive pulmonary disease, unspecified: Secondary | ICD-10-CM | POA: Diagnosis not present

## 2022-11-05 DIAGNOSIS — K219 Gastro-esophageal reflux disease without esophagitis: Secondary | ICD-10-CM | POA: Diagnosis not present

## 2022-11-05 DIAGNOSIS — G43909 Migraine, unspecified, not intractable, without status migrainosus: Secondary | ICD-10-CM | POA: Diagnosis not present

## 2022-11-11 DIAGNOSIS — Z7982 Long term (current) use of aspirin: Secondary | ICD-10-CM | POA: Diagnosis not present

## 2022-11-11 DIAGNOSIS — Z960 Presence of urogenital implants: Secondary | ICD-10-CM | POA: Diagnosis not present

## 2022-11-11 DIAGNOSIS — J449 Chronic obstructive pulmonary disease, unspecified: Secondary | ICD-10-CM | POA: Diagnosis not present

## 2022-11-11 DIAGNOSIS — Z7951 Long term (current) use of inhaled steroids: Secondary | ICD-10-CM | POA: Diagnosis not present

## 2022-11-11 DIAGNOSIS — Z888 Allergy status to other drugs, medicaments and biological substances status: Secondary | ICD-10-CM | POA: Diagnosis not present

## 2022-11-11 DIAGNOSIS — E039 Hypothyroidism, unspecified: Secondary | ICD-10-CM | POA: Diagnosis not present

## 2022-11-11 DIAGNOSIS — N3020 Other chronic cystitis without hematuria: Secondary | ICD-10-CM | POA: Diagnosis not present

## 2022-11-11 DIAGNOSIS — Z79890 Hormone replacement therapy (postmenopausal): Secondary | ICD-10-CM | POA: Diagnosis not present

## 2022-11-11 DIAGNOSIS — N201 Calculus of ureter: Secondary | ICD-10-CM | POA: Diagnosis not present

## 2022-11-11 DIAGNOSIS — G43909 Migraine, unspecified, not intractable, without status migrainosus: Secondary | ICD-10-CM | POA: Diagnosis not present

## 2022-11-11 DIAGNOSIS — F17210 Nicotine dependence, cigarettes, uncomplicated: Secondary | ICD-10-CM | POA: Diagnosis not present

## 2022-11-11 DIAGNOSIS — Z91041 Radiographic dye allergy status: Secondary | ICD-10-CM | POA: Diagnosis not present

## 2022-11-11 DIAGNOSIS — Z79899 Other long term (current) drug therapy: Secondary | ICD-10-CM | POA: Diagnosis not present

## 2022-11-11 DIAGNOSIS — N209 Urinary calculus, unspecified: Secondary | ICD-10-CM | POA: Diagnosis not present

## 2022-11-11 DIAGNOSIS — N329 Bladder disorder, unspecified: Secondary | ICD-10-CM | POA: Diagnosis not present

## 2022-11-11 DIAGNOSIS — D303 Benign neoplasm of bladder: Secondary | ICD-10-CM | POA: Diagnosis not present

## 2022-11-11 DIAGNOSIS — I2510 Atherosclerotic heart disease of native coronary artery without angina pectoris: Secondary | ICD-10-CM | POA: Diagnosis not present

## 2022-11-11 DIAGNOSIS — N200 Calculus of kidney: Secondary | ICD-10-CM | POA: Diagnosis not present

## 2022-11-11 DIAGNOSIS — K219 Gastro-esophageal reflux disease without esophagitis: Secondary | ICD-10-CM | POA: Diagnosis not present

## 2022-11-20 DIAGNOSIS — N202 Calculus of kidney with calculus of ureter: Secondary | ICD-10-CM | POA: Diagnosis not present

## 2022-11-20 DIAGNOSIS — Z960 Presence of urogenital implants: Secondary | ICD-10-CM | POA: Diagnosis not present

## 2022-11-20 DIAGNOSIS — N3090 Cystitis, unspecified without hematuria: Secondary | ICD-10-CM | POA: Diagnosis not present

## 2022-11-20 DIAGNOSIS — Z87442 Personal history of urinary calculi: Secondary | ICD-10-CM | POA: Diagnosis not present

## 2022-11-25 DIAGNOSIS — N3281 Overactive bladder: Secondary | ICD-10-CM | POA: Diagnosis not present

## 2022-11-25 DIAGNOSIS — G43709 Chronic migraine without aura, not intractable, without status migrainosus: Secondary | ICD-10-CM | POA: Diagnosis not present

## 2022-11-25 DIAGNOSIS — F17210 Nicotine dependence, cigarettes, uncomplicated: Secondary | ICD-10-CM | POA: Diagnosis not present

## 2022-11-25 DIAGNOSIS — M47812 Spondylosis without myelopathy or radiculopathy, cervical region: Secondary | ICD-10-CM | POA: Diagnosis not present

## 2022-11-25 DIAGNOSIS — Z87442 Personal history of urinary calculi: Secondary | ICD-10-CM | POA: Diagnosis not present

## 2022-11-25 DIAGNOSIS — J449 Chronic obstructive pulmonary disease, unspecified: Secondary | ICD-10-CM | POA: Diagnosis not present

## 2022-11-25 DIAGNOSIS — I2510 Atherosclerotic heart disease of native coronary artery without angina pectoris: Secondary | ICD-10-CM | POA: Diagnosis not present

## 2022-11-25 DIAGNOSIS — M47816 Spondylosis without myelopathy or radiculopathy, lumbar region: Secondary | ICD-10-CM | POA: Diagnosis not present

## 2022-11-25 DIAGNOSIS — N393 Stress incontinence (female) (male): Secondary | ICD-10-CM | POA: Diagnosis not present

## 2022-11-26 ENCOUNTER — Other Ambulatory Visit: Payer: Self-pay | Admitting: Cardiovascular Disease

## 2022-11-27 ENCOUNTER — Other Ambulatory Visit (HOSPITAL_COMMUNITY): Payer: Self-pay

## 2022-12-08 ENCOUNTER — Encounter: Payer: Self-pay | Admitting: Cardiovascular Disease

## 2022-12-08 ENCOUNTER — Ambulatory Visit: Payer: BC Managed Care – PPO | Attending: Cardiovascular Disease | Admitting: Cardiovascular Disease

## 2022-12-08 VITALS — BP 116/84 | HR 68 | Ht 66.5 in | Wt 156.0 lb

## 2022-12-08 DIAGNOSIS — C50412 Malignant neoplasm of upper-outer quadrant of left female breast: Secondary | ICD-10-CM

## 2022-12-08 DIAGNOSIS — G43709 Chronic migraine without aura, not intractable, without status migrainosus: Secondary | ICD-10-CM

## 2022-12-08 DIAGNOSIS — I10 Essential (primary) hypertension: Secondary | ICD-10-CM

## 2022-12-08 DIAGNOSIS — E039 Hypothyroidism, unspecified: Secondary | ICD-10-CM

## 2022-12-08 DIAGNOSIS — I2510 Atherosclerotic heart disease of native coronary artery without angina pectoris: Secondary | ICD-10-CM | POA: Diagnosis not present

## 2022-12-08 DIAGNOSIS — E782 Mixed hyperlipidemia: Secondary | ICD-10-CM

## 2022-12-08 DIAGNOSIS — Z720 Tobacco use: Secondary | ICD-10-CM | POA: Diagnosis not present

## 2022-12-08 DIAGNOSIS — N200 Calculus of kidney: Secondary | ICD-10-CM

## 2022-12-08 DIAGNOSIS — Z171 Estrogen receptor negative status [ER-]: Secondary | ICD-10-CM

## 2022-12-08 DIAGNOSIS — J432 Centrilobular emphysema: Secondary | ICD-10-CM

## 2022-12-08 MED ORDER — OMEGA-3-ACID ETHYL ESTERS 1 G PO CAPS: ORAL_CAPSULE | ORAL | 3 refills | Status: DC

## 2022-12-08 MED ORDER — METOPROLOL TARTRATE 25 MG PO TABS: ORAL_TABLET | ORAL | 3 refills | Status: DC

## 2022-12-08 NOTE — Patient Instructions (Signed)
Medication Instructions:  Your physician recommends that you continue on your current medications as directed. Please refer to the Current Medication list given to you today.  *If you need a refill on your cardiac medications before your next appointment, please call your pharmacy*   Lab Work: None   Testing/Procedures: None   Follow-Up: At [facility], you and your health needs are our priority.  As part of our continuing mission to provide you with exceptional heart care, we have created designated Provider Care Teams.  These Care Teams include your primary Cardiologist (physician) and Advanced Practice Providers (APPs -  Physician Assistants and Nurse Practitioners) who all work together to provide you with the care you need, when you need it. .    Your next appointment:    April/May  Provider:   Thomas Kelly, MD  

## 2022-12-08 NOTE — Progress Notes (Signed)
Cardiology Office Note    Date:  12/15/2022   ID:  Stacy Fernandez, Stacy Fernandez 30-Jul-1960, MRN [id number]  PCP:  Stacy Manson, NP  Cardiologist:  Stacy Guadalajara, MD   26 month F/U   History of Present Illness:  Stacy Fernandez is a 62 y.o. female  who presents to the office for a 56 month cardiology evaluation in follow-up of her cardiac catheterization.  I initially saw Stacy Fernandez in 2014 for evaluation of chest pain.  She has a history of tobacco use, migraine headaches as well as COPD/emphysema.  In October 2013 a nuclear perfusion study showed normal perfusion and function.  She also had a history of GERD with reflux and had undergone esophageal manometry.  She developed chest pain leading to a [facility] long hospital presentation.  She underwent diagnostic cardiac catheterization by Dr. Allyson Sabal on December 25, 2012 which showed 40% ostial first diagonal branch stenosis of the LAD; all other vessels were normal.  EF was 60%.She had continued to smoke cigarettes and I saw her in follow-up of her hospitalization in September 2014.   She has a history of a history of breast CA and is followed by Stacy Fernandez.  Apparently earlier this year both she and her husband develop symptoms of COVID-19.  They believe that this may have been acquired in their church since 19 members tested positive and to subsequently died.  Her husband developed COVID initially and was hospitalized at [facility] long for 3 weeks.  She was admitted to [facility] due to her COVID in early Stacy and was hospitalized for 1 week.  The day of discharge is when the [facility] campus open for COVID patients.  Subsequent testing 2 weeks later still revealed her to be COVID positive and it was not until a May test that she tested COVID negative.  Subsequent antibody testing revealed that she had a strong positive antibody response.  Due to a 2 to 4-week period of increasing palpitations as well as some vague discomfort in her chest described  as a tightness she was referred for a telemedicine evaluation with me on January 05, 2019.  During that evaluation she states that her levothyroxine dose had been reduced from 175 down to 125 mcg.  I recommended that she wear an event monitor for 2 weeks.  I initiated therapy with metoprolol tartrate initially at 12.5 mg twice a day.  Since I did not know her blood pressure I did not initiate at a higher dose.  At the time she was drinking 1 to 2 cups of coffee per day and discontinuance of all caffeine was recommended.  In light of her previous catheterization in 2014 which showed mild nonobstructive disease with her longstanding tobacco history I recommended she undergo CT coronary angiogram to further evaluate her chest tightness and assess for progressive CAD.  I also scheduled her for 2D echo Doppler study and follow-up chest x-ray.  We discussed the importance of complete smoking cessation.   Her 2D echo Doppler study was done on January 09, 2019 which showed an EF of 60 to 65%.  He had normal diastolic parameters.  There was mild aortic sclerosis and mild thickening of mitral valve leaflets.  Both atrium were normal in size.  A 2-week monitor from June 29 through January 29, 2019 was essentially unremarkable.  She was predominantly in sinus rhythm July 6 at 4:47 AM with a rate 56 and a maximum heart rate  on January 28, 2019 at 7:49 PM at 126 bpm which was sinus tachycardia.  She underwent CT coronary angiography.  Calcium score was increased to 514 which was 99th percentile for age and sex matched control.  There is moderate calcified plaque in the proximal LAD with 50 to 69% stenosis as well as moderate calcified plaque in the proximal OM with 50 to 69% stenosis.  FFR was sent but results are still pending.  She has had issues with somewhat low blood pressure.  Laboratory from January 16, 2019 showed increased total cholesterol at 202, triglycerides 228, HDL 39, LDL 117.  Rosuvastatin 10 mg was initiated and she has  been on therapy for approximately 3 weeks and tolerating this well.    Since I last saw her, her FFR analysis was essentially negative with mean at 0.99, proximal LAD 0.98, mid LAD 0.84, however the distal LAD was 0.75.  The circumflex and RCA FFR's were all normal at 0.9 and above.  At that time, continued medical therapy was recommended.  When I saw her on April 17, 2019 she was experiencing occasional palpitations and admitted to episodes of chest pressure.   Oftentimes her chest pressure may last 10 to 15 minutes and resolve spontaneously.  During that evaluation, in light of her increasing symptomatology I recommended definitive repeat cardiac catheterization.  Catheterization was done on April 28, 2019 which revealed predominant single-vessel CAD with calcification of the proximal to mid LAD with 20% ostial diagonal stenosis, 30% stenosis of the LAD immediately after the diagonal vessel, and 60% smooth mildly calcified stenosis of the mid LAD and a slight bend in the vessel proximal to the second diagonal takeoff with mild 10% LAD stenosis beyond.  The LAD was large caliber and wrapped around the apex.  Her circumflex and dominant RCA were normal.  Following her catheterization I initiated amlodipine 2.5 mg at bedtime since she did not tolerate isosorbide initiation secondary to precipitation of migraine headaches.  Also adjusted beta-blocker therapy with metoprolol tartrate 25 mg twice a day, and increase rosuvastatin to 40 mg daily in attempt to induce plaque regression.  I strongly advised smoking cessation.  Subsequent to her catheterization she has felt improved.  She still works third shift.  She reduced her tobacco but still smokes 2 to 3 cigarettes/day.  At times she still experiences some mild chest discomfort.  Typically she gets home from work at 7:30 AM, sleeps, and goes to work at [employer].     I evaluated her in a telemedicine visit in Stacy 2021.  She was continuing to smoke  cigarettes and had not been able to quit.  She did experience episodes of heartburn.  She was working on the night shift.  She is status post Nissen fundoplication for significant reflux disease over 20 years ago.  She had noticed some blood pressure lability and at times her pulse rate was fast.  During that evaluation I recommended further titration of metoprolol to 37.5 mg twice a day both for improved rate control and potential anti-ischemic benefit.  I saw her on April 05, 2020.  Over the previous 5 when she had felt well.  She will be switching to working the day shift instead of the night shift.  Her blood pressure had improved with medication adjustments.  She had undergone laboratory for primary doctor and was told that her labs were stable.  Unfortunately she continues to smoke cigarettes but has reduced her amount. She continues to be on amlodipine  2.5 mg, metoprolol tartrate 37.5 mg twice a day for blood pressure and her CAD.  She denies any recent anginal symptoms.  She is on Zetia and rosuvastatin 40 mg for hyperlipidemia.  She is on Trelegy Ellipta for her lung disease.  She was evaluated in a virtual visit by Jairo Ben nurse practitioner in June and at that time she was told that she would be referred to the lung cancer screening program.    She continued to take levothyroxine for hypothyroidism.    I last saw her on October 09, 2020.  Since her last evaluation, she is sleeping much better now that she is working the day shift.  She has not had follow-up laboratory.  She sees Kathlen Brunswick, NP at [facility].  Unfortunately she was still smoking.  She denied chest pain or palpitations.    Since I last saw her, she has continued to be followed by Juliann Mule, NP at [facility].  She had issues with COPD in November 2023.  In December/January she developed RSV virus.  In February/March she has had issues with hematuria secondary to kidney stone in her right ureter.  She was referred by  Juliann Mule NP to [facility] urology and her kidney stone was removed on Nov 25, 2022.  She denies any anginal symptoms.  She is on metoprolol tartrate 37.5 mg twice a day and denies any significant palpitations.  She is on levothyroxine 75 mcg for hypothyroidism.  She is on as needed albuterol, Trelegy Ellipta Singulair and ProAir for her lungs.  Unfortunately she still smokes cigarettes, 1/2 pack/day.  She is on Lovaza 1 capsule twice a day.  She takes [place] for migraines as needed.  She is scheduled to undergo right eye surgery on June 7 at [facility].  She presents for evaluation.  Past Medical History:  Diagnosis Date   Anxiety    Cervical spondylolysis    Cervicalgia    Chemotherapy-induced peripheral neuropathy (HCC)    FEET   Chronic migraine    neurologist--- dr Terrace Arabia;    incidently finding left supraclinoid ICA brain aneurysm, residual migraines, --  BOTOX TX'S  by dr Terrace Arabia   Cigarette nicotine dependence    CKD (chronic kidney disease), stage III (HCC)    Continuous opioid dependence (HCC)    followed by pain clinic [facility]   COPD with emphysema [facility])    pulmonologist--- dr Vassie Loll;    advanded emphysema per CT,  pt strated back smoking ,  no oxygen;   last exacerbation 02-26-2022 admission for O2 sats 80s  d/c'd no oxygen resolved   Coronary artery disease    cardiologist--- dr Karie Schwalbe. 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  infundibulum--  residual chronic migraine   Wears dentures    upper   Wears glasses     Past Surgical History:  Procedure Laterality Date   APPENDECTOMY  1992   BREAST RECONSTRUCTION WITH PLACEMENT OF TISSUE EXPANDER AND FLEX HD (ACELLULAR HYDRATED DERMIS) Bilateral 09/25/2014   Procedure: PLACEMENT OF BILATERAL TISSUE EXPANDER AND  ACELLULAR DERMIS FOR BREAST RECONSTRUCTION ;  Surgeon: Glenna Fellows, MD;  Location: [facility];  Service: Plastics;  Laterality: Bilateral;   CHOLECYSTECTOMY  1999   COLONOSCOPY  11/2021   ESOPHAGOGASTRODUODENOSCOPY (EGD) WITH PROPOFOL  01/18/2013   LAPAROSCOPIC NISSEN FUNDOPLICATION  10/11/2000   LEFT HEART CATH AND CORONARY ANGIOGRAPHY N/A 04/28/2019   Procedure: LEFT HEART CATH AND CORONARY ANGIOGRAPHY;  Surgeon: Lennette Bihari, MD;  Location: [facility];  Service: Cardiovascular;  Laterality: N/A;   LEFT HEART CATHETERIZATION WITH CORONARY ANGIOGRAM N/A 12/26/2012   Procedure: LEFT HEART CATHETERIZATION WITH CORONARY ANGIOGRAM;  Surgeon: Runell Gess, MD;  Location: [facility] CATH LAB;  Service: Cardiovascular;  Laterality: N/A;   Nonobstructive CAD/  40-50% ostial D1/  normal LVF, ef 60%   LIPOSUCTION WITH LIPOFILLING Bilateral 12/18/2014   Procedure: LIPOFILLING TO BILATERAL CHEST;  Surgeon: Glenna Fellows, MD;  Location: [facility];  Service: Plastics;  Laterality: Bilateral;   ORIF WRIST FRACTURE Left 06/02/2022   Procedure: OPEN REDUCTION INTERNAL FIXATION (ORIF) WRIST FRACTURE;  Surgeon: Sheral Apley, MD;  Location: [facility];  Service: Orthopedics;  Laterality: Left;   PORT-A-CATH REMOVAL Right 06/28/2015   Procedure: REMOVAL PORT-A-CATH;  Surgeon: Romie Levee, MD;  Location:  [facility];  Service: General;  Laterality: Right;   PORTACATH PLACEMENT N/A 04/02/2014   Procedure: INSERTION PORT-A-CATH;  Surgeon: Claud Kelp, MD;  Location: [facility] OR;  Service: General;  Laterality: N/A;   REMOVAL OF BILATERAL TISSUE EXPANDERS WITH PLACEMENT OF BILATERAL BREAST IMPLANTS Bilateral 12/18/2014   Procedure: REMOVAL OF BILATERAL TISSUE EXPANDERS,PLACEMENT SILICONE IMPLANTS ;  Surgeon: Glenna Fellows, MD;  Location: [facility];  Service: Plastics;  Laterality: Bilateral;   SIMPLE MASTECTOMY WITH AXILLARY SENTINEL NODE BIOPSY Bilateral 04/02/2014   Procedure: LEFT TOTAL MASTECTOMY WITH LEFT AXILLARY SENTINEL NODE BIOPSY, RIGHT PROPHYLACTIC MASTETCTOMY;  Surgeon: Claud Kelp, MD;  Location: [facility] OR;  Service: General;  Laterality: Bilateral;   TOTAL ABDOMINAL HYSTERECTOMY W/ BILATERAL SALPINGOOPHORECTOMY  1996   VESICO-VAGINAL FISTULA REPAIR N/A 07/17/2015   Procedure: REPAIR OF RECTOVAGINAL FISTULA;  Surgeon: Lazaro Arms, MD;  Location: [facility];  Service: Gynecology;  Laterality: N/A;   VIDEO BRONCHOSCOPY Bilateral 01/16/2013   Procedure: VIDEO BRONCHOSCOPY WITHOUT FLUORO;  Surgeon: Oretha Milch, MD;  Location: [facility];  Service: Cardiopulmonary;  Laterality: Bilateral;    Current Medications: Outpatient Medications Prior to Visit  Medication Sig Dispense Refill   albuterol (PROVENTIL) (2.5 MG/3ML) 0.083% nebulizer solution Take 3 mLs (2.5 mg total) by nebulization every 6 (six) hours as needed for wheezing or shortness of breath. (Patient taking differently: Take 2.5 mg by nebulization every 6 (six) hours as needed for wheezing or shortness of breath.) 75 mL 0   ALPRAZolam (XANAX) 0.25 MG tablet Take 0.25 mg by mouth 2 (two) times daily.     aspirin EC 81 MG tablet Take 1 tablet (81 mg total) by mouth daily. (Patient taking differently: Take 81 mg by mouth daily. Chewable) 90 tablet 3   Fluticasone-Umeclidin-Vilant (  TRELEGY ELLIPTA)  100-62.5-25 MCG/ACT AEPB Inhale 1 puff into the lungs daily. 180 each 3   HYDROcodone-acetaminophen (NORCO) 7.5-325 MG tablet Take 1 tablet by mouth 2 (two) times daily.     levothyroxine (SYNTHROID) 75 MCG tablet Take 75 mcg by mouth daily.     loratadine (CLARITIN) 10 MG tablet Take 10 mg by mouth daily.     meloxicam (MOBIC) 15 MG tablet Take 1 tablet (15 mg total) by mouth daily. For pain and inflammation 30 tablet 0   montelukast (SINGULAIR) 10 MG tablet Take 10 mg by mouth at bedtime.      pantoprazole (PROTONIX) 40 MG tablet Take 1 tablet (40 mg total) by mouth daily. (Patient taking differently: Take 40 mg by mouth daily as needed.) 90 tablet 3   PROAIR HFA 108 (90 BASE) MCG/ACT inhaler Inhale 2 puffs into the lungs every 4 (four) hours as needed for wheezing or shortness of breath.     Ubrogepant (UBRELVY) 50 MG TABS Take 1 tab at onset of migraine.  May repeat in 2 hrs, if needed.  Max dose: 2 tabs/day. This is a 30 day prescription. (Patient taking differently: as directed. Take 1 tab at onset of migraine.  May repeat in 2 hrs, if needed.  Max dose: 2 tabs/day. This is a 30 day prescription.) 12 tablet 11   metoprolol tartrate (LOPRESSOR) 25 MG tablet TAKE 1.5 TABLET BY MOUTH IN THE MORNING AND 1.5 TABLETS IN THE EVENING. PT NEEDS APPT 135 tablet 0   omega-3 acid ethyl esters (LOVAZA) 1 g capsule TAKE 1 CAPSULE BY MOUTH 2 TIMES DAILY. SCHEDULE AN APPOINTMENT FOR FURTHER REFILLS, 2ND ATTEMPT 180 capsule 0   acetaminophen (TYLENOL) 500 MG tablet Take 2 tablets (1,000 mg total) by mouth every 8 (eight) hours as needed for mild pain or moderate pain. (Patient not taking: Reported on 12/08/2022) 80 tablet 0   ondansetron (ZOFRAN-ODT) 4 MG disintegrating tablet Take 1 tablet (4 mg total) by mouth 2 (two) times daily as needed for nausea or vomiting. (Patient not taking: Reported on 12/08/2022) 10 tablet 0   oxyCODONE (ROXICODONE) 5 MG immediate release tablet Take 1 tablet (5 mg total) by mouth every  8 (eight) hours as needed for severe pain or breakthrough pain (after surgery that is not controlled by your normal daily dose of Vicodin). 20 tablet 0   No facility-administered medications prior to visit.     Allergies:   Prednisone   Social History   Socioeconomic History   Marital status: Married    Spouse name: Eddie    Number of children: 4   Years of education: GED   Highest education level: Not on file  Occupational History    Employer: [employer]  Tobacco Use   Smoking status: Every Day    Packs/day: 0.50    Years: 42.00    Additional pack years: 0.00    Total pack years: 21.00    Types: Cigarettes   Smokeless tobacco: Never   Tobacco comments:    06-01-2022 Pt restarted smoking 2018 after quitting 2015,  currently down to 10-12 daily and has cut down prior to [id number]  (smoking since age 38)  Vaping Use   Vaping Use: Former   Devices: hx vaping/ ecig  Substance and Sexual Activity   Alcohol use: Never   Drug use: No   Sexual activity: Not Currently  Other Topics Concern   Not on file  Social History Narrative   Patient lives at home  with her husband Link Snuffer) Patient works full time.   Education- GED   Right handed.   Caffeine- one cup of coffee daily.   Social Determinants of Health   Financial Resource Strain: Low Risk  (10/24/2018)   Overall Financial Resource Strain (CARDIA)    Difficulty of Paying Living Expenses: Not hard at all  Food Insecurity: No Food Insecurity (10/24/2018)   Hunger Vital Sign    Worried About Running Out of Food in the Last Year: Never true    Ran Out of Food in the Last Year: Never true  Transportation Needs: No Transportation Needs (10/24/2018)   PRAPARE - Administrator, Civil Service (Medical): No    Lack of Transportation (Non-Medical): No  Physical Activity: Unknown (10/24/2018)   Exercise Vital Sign    Days of Exercise per Week: Patient declined    Minutes of Exercise per Session: Patient declined   Stress: Unknown (10/24/2018)   [employer] of Occupational Health - Occupational Stress Questionnaire    Feeling of Stress : Patient declined  Social Connections: Unknown (10/24/2018)   Social Connection and Isolation Panel [NHANES]    Frequency of Communication with Friends and Family: Patient declined    Frequency of Social Gatherings with Friends and Family: Patient declined    Attends Religious Services: Patient declined    Database administrator or Organizations: Patient declined    Attends Banker Meetings: Patient declined    Marital Status: Patient declined     Family History:  The patient's family history includes Breast cancer (age of onset: 88) in her paternal aunt; Cancer in her paternal grandfather; Colon cancer (age of onset: 23) in her paternal grandmother; Colon cancer (age of onset: 69) in her father; Heart attack in her maternal grandmother; Heart failure in her mother; Ovarian cancer (age of onset: 69) in her sister; Ovarian cancer (age of onset: 3) in an other family member; Throat cancer (age of onset: 57) in her brother; Throat cancer (age of onset: 24) in her brother.   ROS General: Negative; No fevers, chills, or night sweats;  HEENT: Negative; No changes in vision or hearing, sinus congestion, difficulty swallowing Pulmonary: Negative; No cough, wheezing, shortness of breath, hemoptysis Cardiovascular: See HPI GI: GERD GU: Negative; No dysuria, hematuria, or difficulty voiding Musculoskeletal: Fall resulting in rib fracture and fracture fibula; 5 degenerative disks in lower lumbar region, 3 and upper cervical.  Followed at [facility]. Hematologic/Oncology: History of breast CA Endocrine: Negative; no heat/cold intolerance; no diabetes Neuro: Negative; no changes in balance, headaches Skin: Negative; No rashes or skin lesions Psychiatric: Negative; No behavioral problems, depression Sleep: Negative; No snoring, daytime sleepiness,  hypersomnolence, bruxism, restless legs, hypnogognic hallucinations, no cataplexy Other comprehensive 14 point system review is negative.   PHYSICAL EXAM:   VS:  BP 116/84   Pulse 68   Ht 5' 6.5" (1.689 m)   Wt 156 lb (70.8 kg)   SpO2 96%   BMI 24.80 kg/m     Repeat blood pressure by me was 110/72  Wt Readings from Last 3 Encounters:  12/08/22 156 lb (70.8 kg)  06/08/22 142 lb (64.4 kg)  06/02/22 144 lb (65.3 kg)    General: Alert, oriented, no distress.  Skin: normal turgor, no rashes, warm and dry HEENT: Normocephalic, atraumatic. Pupils equal round and reactive to light; sclera anicteric; extraocular muscles intact;  Nose without nasal septal hypertrophy Mouth/Parynx benign; Mallinpatti scale 3 Neck: No JVD, no carotid bruits; normal  carotid upstroke Lungs: clear to ausculatation and percussion; no wheezing or rales Chest wall: without tenderness to palpitation Heart: PMI not displaced, RRR, s1 s2 normal, 1/6 systolic murmur, no diastolic murmur, no rubs, gallops, thrills, or heaves Abdomen: soft, nontender; no hepatosplenomehaly, BS+; abdominal aorta nontender and not dilated by palpation. Back: no CVA tenderness Pulses 2+ Musculoskeletal: full range of motion, normal strength, no joint deformities Extremities: no clubbing cyanosis or edema, Homan's sign negative  Neurologic: grossly nonfocal; Cranial nerves grossly wnl Psychologic: Normal mood and affect   Studies/Labs Reviewed:   Dec 08, 2022 ECG (independently read by me): NSR at 68, RV conduction delay  October 09, 2020 ECG (independently read by me): NSR at 72; IRBBB  April 05, 2020 ECG (independently read by me): NSR at 69, IRBBB, no ectopy  June 27, 2019 ECG (independently read by me): Normal sinus rhythm at 70 bpm.  No ectopy.  Normal intervals.  September 2020 ECG (independently read by me): Normal sinus rhythm at 75 beats per minute.  New mild RV conduction delay V1 V2 with T wave inversion and new  T wave inversion in lead V3 and V4.  Normal intervals.  No ectopy  Stacy 11, 2020 ECG (independently read by me): Normal sinus rhythm at 74 bpm.  No ectopy.  Normal intervals.  No ST segment changes.  Recent Labs:    Latest Ref Rng & Units 06/02/2022    9:29 AM 02/26/2022   10:41 AM 02/26/2022   10:18 AM  BMP  Glucose 70 - 99 mg/dL 79   91   BUN 6 - 20 mg/dL 14   12   Creatinine 7.82 - 1.00 mg/dL 9.56   2.13   Sodium 086 - 145 mmol/L 142  137  137   Potassium 3.5 - 5.1 mmol/L 4.1  3.8  3.7   Chloride 98 - 111 mmol/L 105   103   CO2 22 - 32 mmol/L   23   Calcium 8.9 - 10.3 mg/dL   9.1         Latest Ref Rng & Units 02/22/2022    3:40 PM 04/08/2020    8:14 AM 05/15/2019    3:27 PM  Hepatic Function  Total Protein 6.5 - 8.1 g/dL 7.5  7.0  7.3   Albumin 3.5 - 5.0 g/dL 4.7  4.7  4.1   AST 15 - 41 U/L 17  16  16    ALT 0 - 44 U/L 15  16  16    Alk Phosphatase 38 - 126 U/L 66  120  139   Total Bilirubin 0.3 - 1.2 mg/dL 0.7  0.5  0.4        Latest Ref Rng & Units 06/02/2022    9:29 AM 02/27/2022    6:43 AM 02/26/2022   10:41 AM  CBC  WBC 4.0 - 10.5 K/uL  9.0    Hemoglobin 12.0 - 15.0 g/dL 57.8  46.9  62.9   Hematocrit 36.0 - 46.0 % 34.0  33.8  34.0   Platelets 150 - 400 K/uL  235     Lab Results  Component Value Date   MCV 94.4 02/27/2022   MCV 91.9 02/26/2022   MCV 93.8 02/22/2022   Lab Results  Component Value Date   TSH 0.052 (L) 04/08/2020   Lab Results  Component Value Date   HGBA1C 5.0 12/26/2012     BNP No results found for: "BNP"  ProBNP    Component Value Date/Time   PROBNP  326.1 (H) 12/26/2012 0553     Lipid Panel     Component Value Date/Time   CHOL 144 04/08/2020 0814   TRIG 206 (H) 04/08/2020 0814   HDL 43 04/08/2020 0814   CHOLHDL 3.3 04/08/2020 0814   CHOLHDL 4.4 12/26/2012 0340   VLDL 17 12/26/2012 0340   LDLCALC 67 04/08/2020 0814     RADIOLOGY: No results found.   Additional studies/ records that were reviewed today include:   ECHO 02/04/2015 Study Conclusions   - Left ventricle: The cavity size was normal. Systolic function was   normal. The estimated ejection fraction was in the range of 50%   to 55%. Wall motion was normal; there were no regional wall   motion abnormalities. Doppler parameters are consistent with   abnormal left ventricular relaxation (grade 1 diastolic   dysfunction). There was no evidence of elevated ventricular   filling pressure by Doppler parameters. - Aortic valve: Trileaflet; normal thickness leaflets.   Transvalvular velocity was within the normal range. There was no   stenosis. There was no regurgitation. - Aortic root: The aortic root was normal in size. - Mitral valve: Structurally normal valve. There was no   regurgitation. - Left atrium: The atrium was normal in size. - Right ventricle: The cavity size was normal. Wall thickness was   normal. Systolic function was normal. - Right atrium: The atrium was normal in size. - Tricuspid valve: There was trivial regurgitation. - Pulmonary arteries: Systolic pressure was within the normal   range. - Inferior vena cava: The vessel was normal in size. - Pericardium, extracardiac: There was no pericardial effusion.   ECHO IMPRESSIONS 01/09/2019  1. The left ventricle has normal systolic function with an ejection fraction of 60-65%. The cavity size was normal. Left ventricular diastolic parameters were normal.  2. The right ventricle has normal systolic function. The cavity was normal. There is no increase in right ventricular wall thickness.  3. Mild thickening of the mitral valve leaflet.  4. The aortic valve is tricuspid. Mild thickening of the aortic valve. Mild calcification of the aortic valve.   Cardiac CT IMPRESSION:02/02/2019  1. Coronary calcium score of 514. This was 99th percentile for age and sex matched control.   2. Normal coronary origin with right dominance.   3. Moderate calcified plaque in the proximal LAD with  50-69%% stenosis. CAD RAD 3.   4. Moderate calcified plaque in the proximal OM with 50-69% stenosis. CAD RAD 3   5. FFR has been sent to evaluate LAD and OM.   6. Recommend Aggressive Risk Factor Modification.  CT FFR ANALYSIS FINDINGS: FFRct analysis was performed on the original cardiac CT angiogram dataset. Diagrammatic representation of the FFRct analysis is provided in a separate PDF document in PACS. This dictation was created using the PDF document and an interactive 3D model of the results. 3D model is not available in the EMR/PACS. Normal FFR range is >0.80.   1. Left Main: No significant stenosis.  FFR LM = 0.99   2. LAD: No significant stenosis. Proximal FFR = 0.98, Mid FFR = 0.84, Distal FFR = 0.75. 3. LCX: No significant stenosis. Proximal FFR = 0.98, Mid FFR = 0.96, Distal FFR = 0.93. 4. RCA: No significant stenosis. Proximal FFR = 0.99, Mid FFR = 0.95, Distal FFR = 0.91. PDA not assessed.   IMPRESSION: 1. CT FFR analysis showed a borderline hemodynamically significant reduction in flow in the distal LAD. This represents either diffuse distal disease or distal  tapering of the vessel.   2. Recommend medical management and aggressive risk factor Modification.    CARDIAC CATH: 04/28/2019 Prox LAD to Mid LAD lesion is 30% stenosed. Mid LAD-1 lesion is 60% stenosed. Mid LAD-2 lesion is 10% stenosed. The left ventricular systolic function is normal. LV end diastolic pressure is low. 1st Diag lesion is 20% stenosed.   Single-vessel CAD with calcification of the proximal to mid LAD with 20% ostial diagonal stenosis with 30% stenosis of the LAD immediately after the diagonal vessel, 60% smooth mildly calcified stenosis of the LAD in a slight bend in the vessel proximal to the second diagonal takeoff with mild smooth 10% LAD stenosis in the mid LAD.  The LAD is a large caliber and wraps around the LV apex.   Normal left circumflex and dominant RCA.   No LV function  with EF estimate at least 55%.  LVEDP 9 mm.   RECOMMENDATION: Medical therapy trial.  The patient did not tolerate isosorbide initiation secondary to precipitation migraine headaches; will initiate amlodipine 2.5 mg at bedtime and adjust beta-blocker therapy with metoprolol tartrate to 25 mg twice a day.  Increase rosuvastatin to 40 mg daily in attempt to induce plaque regression.  Smoking cessation is imperative.       ASSESSMENT:    1. Coronary artery disease involving native coronary artery of native heart without angina pectoris   2. Essential hypertension   3. Mixed hyperlipidemia   4. Tobacco use   5. Malignant neoplasm of upper-outer quadrant of left breast in female, estrogen receptor negative (HCC): 2015   6. Chronic migraine w/o aura w/o status migrainosus, not intractable   7. Centrilobular emphysema (HCC)   8. Hypothyroidism, unspecified type   9. Kidney stone     PLAN:   1.  Chest pain/CAD: Coronary calcium score was elevated at 514 and with documentation of plaque and recurrent symptomatology despite the addition of beta-blocker and nitrates.  Cardiac catheterization showed coronary calcification of the LAD with the tightest stenosis of 60% in the mid vessel.  She had a normal circumflex and RCA.  Presently, she has been without recurrent anginal symptomatology and continues to be on metoprolol tartrate 37.5 mg twice a day.  She is no longer on amlodipine  2.  Essential hypertension: Blood pressure today is stable on metoprolol 37.5 mg twice a day.  3.  Palpitations: Resolved on her current beta-blocker regimen.  4.  Hyperlipidemia: Previously documented to be mixed hyperlipidemia with elevation of triglycerides, and VLDL.   In September 2021 LDL was 67, but triglycerides remain elevated at 206;  total cholesterol 144 and HDL 43.  She is followed at [facility] who has been checking laboratory.  Target LDL remains less than 70.  5.  Hypothyroidism: She continues to be  on levothyroxine with previous over suppression.  Now on reduced dose at 75 mcg followed by her primary physician.  6.  Tobacco abuse: Unfortunately she continues to smoke cigarettes.  I again discussed the importance of smoking cessation.  She has been smoking for greater than 30 years.  She had been noted to have left upper lobe pulmonary nodule and bullous emphysema.  7.  History of breast CA: Diagnosed in 2015, followed by Stacy Fernandez.  She was unaware that Stacy Fernandez had retired and has not had recent follow-up evaluation.  8.  History of migraine headache, on Ubrelvy as needed  9.  Hematuria secondary to kidney stone in right ureter, removed Nov 25, 2022.  Medication Adjustments/Labs and Tests Ordered: Current medicines are reviewed at length with the patient today.  Concerns regarding medicines are outlined above.  Medication changes, Labs and Tests ordered today are listed in the Patient Instructions below. Patient Instructions  Medication Instructions:  Your physician recommends that you continue on your current medications as directed. Please refer to the Current Medication list given to you today.  *If you need a refill on your cardiac medications before your next appointment, please call your pharmacy*   Lab Work: None   Testing/Procedures: None   Follow-Up: At [facility], you and your health needs are our priority.  As part of our continuing mission to provide you with exceptional heart care, we have created designated Provider Care Teams.  These Care Teams include your primary Cardiologist (physician) and Advanced Practice Providers (APPs -  Physician Assistants and Nurse Practitioners) who all work together to provide you with the care you need, when you need it.   Your next appointment:   Stacy/May  Provider:   Nicki Guadalajara, MD      Signed, Stacy Guadalajara, MD  12/15/2022 4:11 PM    [facility] [address], [address],  [place], [place]  [address] Phone: [phone]

## 2022-12-11 ENCOUNTER — Telehealth: Payer: Self-pay

## 2022-12-11 DIAGNOSIS — Z01818 Encounter for other preprocedural examination: Secondary | ICD-10-CM | POA: Diagnosis not present

## 2022-12-11 NOTE — Telephone Encounter (Signed)
   Pre-operative Risk Assessment    Patient Name: Stacy Fernandez  DOB: 05-13-1961 MRN: [id number]      Request for Surgical Clearance    Procedure:   RLL lesion excision with reconstruction and RLL nasolacrimal biopsy, RLL plastic repair canaliculi, R medial canthoplasty, R NLD probe with stent  Date of Surgery:  Clearance 12/25/22                                 Surgeon:  Dr. Dairl Ponder Surgeon's Group or Practice Name:  [facility] Phone number:  [phone] Fax number:  [phone]   Type of Clearance Requested:   - Medical  - Pharmacy:  Hold Aspirin pt will need instructions on when/if to hold   Type of Anesthesia:  General    Additional requests/questions:    SignedZada Finders   12/11/2022, 11:44 AM

## 2022-12-11 NOTE — Telephone Encounter (Signed)
Good Morning Dr. Tresa Endo  We have received a surgical clearance request for Ms. Hermance. They were seen recently in clinic on 12/08/2022. Can you please comment on surgical clearance and guidance on holding aspirin. Please forward you guidance and recommendations to P CV DIV PREOP.  Thank you, Robin Searing, NP

## 2022-12-14 ENCOUNTER — Encounter: Payer: Self-pay | Admitting: Cardiovascular Disease

## 2022-12-16 NOTE — Telephone Encounter (Signed)
Stacy Fernandez, Stacy Fernandez is following up as patient will need to begin holding medication in the next 24 hours if he is still having his procedure on the scheduled date. She would like to know if anyone else will be able to address clearance on Dr. Landry Dyke behalf as he is out of the office.

## 2022-12-16 NOTE — Telephone Encounter (Signed)
   Patient Name: Stacy Fernandez  DOB: February 12, 1961 MRN: [id number]  Primary Cardiologist: Nicki Guadalajara, MD  Chart reviewed as part of pre-operative protocol coverage. Given past medical history and time since last visit, based on ACC/AHA guidelines, Tucker Bartlett is at acceptable risk for the planned procedure without further cardiovascular testing.  Patient was contacted today and is able to complete 4 METS of activity without any difficulty.  She reports no changes in her health since her previous visit earlier this month with Dr. Tresa Endo.   The patient was advised that if she develops new symptoms prior to surgery to contact our office to arrange for a follow-up visit, and she verbalized understanding.  Per protocol patient can hold aspirin 7 days prior to procedure and should restart postprocedure when surgically safe.  I will route this recommendation to the requesting party via Epic fax function and remove from pre-op pool.  Please call with questions.  Napoleon Form, Leodis Rains, NP 12/16/2022, 9:49 AM

## 2022-12-16 NOTE — Telephone Encounter (Signed)
I will forward to pre op APP to see notes from call that came in from [facility] at requesting office.

## 2022-12-17 DIAGNOSIS — M5136 Other intervertebral disc degeneration, lumbar region: Secondary | ICD-10-CM | POA: Diagnosis not present

## 2022-12-17 DIAGNOSIS — M47816 Spondylosis without myelopathy or radiculopathy, lumbar region: Secondary | ICD-10-CM | POA: Diagnosis not present

## 2022-12-21 ENCOUNTER — Other Ambulatory Visit: Payer: Self-pay | Admitting: Neurology

## 2022-12-22 DIAGNOSIS — R399 Unspecified symptoms and signs involving the genitourinary system: Secondary | ICD-10-CM | POA: Diagnosis not present

## 2022-12-22 DIAGNOSIS — N3281 Overactive bladder: Secondary | ICD-10-CM | POA: Diagnosis not present

## 2022-12-22 DIAGNOSIS — Z09 Encounter for follow-up examination after completed treatment for conditions other than malignant neoplasm: Secondary | ICD-10-CM | POA: Diagnosis not present

## 2022-12-25 DIAGNOSIS — Z7189 Other specified counseling: Secondary | ICD-10-CM | POA: Diagnosis not present

## 2022-12-25 NOTE — Telephone Encounter (Addendum)
Our office received a duplicate request today. I did call the office and s/w Scherrie November, PAC to confirm if truly a duplicate. Per Morrie Sheldon, yes, only resent as the surgeon would like to see if possible to extend hold ASA x 10 days. Morrie Sheldon is aware that our recommendations that were sent was ok to hold ASA x 7 days.   I assured Morrie Sheldon that I will run this back through our pre op APP.   Procedure has been moved to 12/29/22 as well.

## 2022-12-28 ENCOUNTER — Encounter: Payer: Self-pay | Admitting: Internal Medicine

## 2022-12-28 ENCOUNTER — Ambulatory Visit (INDEPENDENT_AMBULATORY_CARE_PROVIDER_SITE_OTHER): Payer: BC Managed Care – PPO | Admitting: Internal Medicine

## 2022-12-28 ENCOUNTER — Telehealth: Payer: Self-pay | Admitting: Pulmonary Disease

## 2022-12-28 ENCOUNTER — Telehealth: Payer: Self-pay | Admitting: Cardiovascular Disease

## 2022-12-28 ENCOUNTER — Other Ambulatory Visit: Payer: Self-pay

## 2022-12-28 ENCOUNTER — Encounter (HOSPITAL_COMMUNITY): Payer: Self-pay | Admitting: Optometry

## 2022-12-28 VITALS — BP 128/76 | HR 65 | Temp 97.8°F | Ht 66.5 in | Wt 156.2 lb

## 2022-12-28 DIAGNOSIS — F17200 Nicotine dependence, unspecified, uncomplicated: Secondary | ICD-10-CM

## 2022-12-28 DIAGNOSIS — J439 Emphysema, unspecified: Secondary | ICD-10-CM | POA: Diagnosis not present

## 2022-12-28 DIAGNOSIS — J4489 Other specified chronic obstructive pulmonary disease: Secondary | ICD-10-CM

## 2022-12-28 DIAGNOSIS — J441 Chronic obstructive pulmonary disease with (acute) exacerbation: Secondary | ICD-10-CM

## 2022-12-28 DIAGNOSIS — Z716 Tobacco abuse counseling: Secondary | ICD-10-CM | POA: Diagnosis not present

## 2022-12-28 MED ORDER — TRELEGY ELLIPTA 100-62.5-25 MCG/ACT IN AEPB: 1.0000 | INHALATION_SPRAY | Freq: Every day | RESPIRATORY_TRACT | 3 refills | Status: DC

## 2022-12-28 NOTE — Telephone Encounter (Deleted)
Routing to Dr Vassie Loll to see if risk assessment could be added to last visit.

## 2022-12-28 NOTE — Telephone Encounter (Signed)
Fax received from Dr. Art Buff with [facility] to perform RLL Lesion Excision with Reconstruction and RLL Nasolacrimal Biopsy, RLL Plastic Repair Canaliculi, R Medial Canthoplasty, R NLD Probe with Stent on patient.  Patient needs surgery clearance. Surgery is 12/29/2022. Patient was seen on 10/09/21. Office protocol is a risk assessment can be sent to surgeon if patient has been seen in 60 days or less.   Sending to Dr. Celine Mans for risk assessment or recommendations if patient needs to be seen in office prior to surgical procedure.

## 2022-12-28 NOTE — Patient Instructions (Addendum)
Follow up with Dr. Vassie Loll in a year.   Continue trelegy inhaler. Refills sent to pharmacy today.  Continue albuterol inhaler as needed.  Call us sooner if concerns about your breathing.   Agree with using patches to help you quit smoking.   Get your CT scan in November 2024 to screen for lung cancer  Good luck with surgery!  What are the benefits of quitting smoking? Quitting smoking can lower your chances of getting or dying from heart disease, lung disease, kidney failure, infection, or cancer. It can also lower your chances of getting osteoporosis, a condition that makes your bones weak. Plus, quitting smoking can help your skin look younger and reduce the chances that you will have problems with sex.  Quitting smoking will improve your health no matter how old you are, and no matter how long or how much you have smoked.  What should I do if I want to quit smoking? The letters in the word "START" can help you remember the steps to take: S = Set a quit date. T = Tell family, friends, and the people around you that you plan to quit. A = Anticipate or plan ahead for the tough times you'll face while quitting. R = Remove cigarettes and other tobacco products from your home, car, and work. T = Talk to your doctor about getting help to quit.  How can my doctor or nurse help? Your doctor or nurse can give you advice on the best way to quit. He or she can also put you in touch with counselors or other people you can call for support. Plus, your doctor or nurse can give you medicines to: ?Reduce your craving for cigarettes ?Reduce the unpleasant symptoms that happen when you stop smoking (called "withdrawal symptoms"). You can also get help from a free phone line (1-800-QUIT-NOW) or go online to [employer].  What are the symptoms of withdrawal? The symptoms include: ?Trouble sleeping ?Being irritable, anxious or restless ?Getting frustrated or angry ?Having trouble thinking  clearly  Some people who stop smoking become temporarily depressed. Some people need treatment for depression, such as counseling or antidepressant medicines. Depressed people might: ?No longer enjoy or care about doing the things they used to like to do ?Feel sad, down, hopeless, nervous, or cranky most of the day, almost every day ?Lose or gain weight ?Sleep too much or too little ?Feel tired or like they have no energy ?Feel guilty or like they are worth nothing ?Forget things or feel confused ?Move and speak more slowly than usual ?Act restless or have trouble staying still ?Think about death or suicide  If you think you might be depressed, see your doctor or nurse. Only someone trained in mental health can tell for sure if you are depressed. If you ever feel like you might hurt yourself, go straight to the nearest emergency department. Or you can call for an ambulance (in the [place] and [place], dial 9-1-1) or call your doctor or nurse right away and tell them it is an emergency. You can also reach the [place] National Suicide Prevention Lifeline at (334) 886-1594 or http://hill.com/.  How do medicines help you stop smoking? Different medicines work in different ways: ?Nicotine replacement therapy eases withdrawal and reduces your body's craving for nicotine, the main drug found in cigarettes. There are different forms of nicotine replacement, including skin patches, lozenges, gum, nasal sprays, and "puffers" or inhalers. Many can be bought without a prescription, while others might require one. ?Bupropion is  a prescription medicine that reduces your desire to smoke. This medicine is sold under the brand names Zyban and Wellbutrin. It is also available in a generic version, which is cheaper than brand name medicines. ?Varenicline (brand names: Chantix, Champix) is a prescription medicine that reduces withdrawal symptoms and cigarette cravings. If you think you'd like to take  varenicline and you have a history of depression, anxiety, or heart disease, discuss this with your doctor or nurse before taking the medicine. Varenicline can also increase the effects of alcohol in some people. It's a good idea to limit drinking while you're taking it, at least until you know how it affects you.  How does counseling work? Counseling can happen during formal office visits or just over the phone. A counselor can help you: ?Figure out what triggers your smoking and what to do instead ?Overcome cravings ?Figure out what went wrong when you tried to quit before  What works best? Studies show that people have the best luck at quitting if they take medicines to help them quit and work with a Veterinary surgeon. It might also be helpful to combine nicotine replacement with one of the prescription medicines that help people quit. In some cases, it might even make sense to take bupropion and varenicline together.  What about e-cigarettes? Sometimes people wonder if using electronic cigarettes, or "e-cigarettes," might help them quit smoking. Using e-cigarettes is also called "vaping." Doctors do not recommend e-cigarettes in place of medicines and counseling. That's because e-cigarettes still contain nicotine as well as other substances that might be harmful. It's not clear how they can affect a person's health in the long term.  Will I gain weight if I quit? Yes, you might gain a few pounds. But quitting smoking will have a much more positive effect on your health than weighing a few pounds more. Plus, you can help prevent some weight gain by being more active and eating less. Taking the medicine bupropion might help control weight gain.   What else can I do to improve my chances of quitting? You can: ?Start exercising. ?Stay away from smokers and places that you associate with smoking. If people close to you smoke, ask them to quit with you. ?Keep gum, hard candy, or something to put in your  mouth handy. If you get a craving for a cigarette, try one of these instead. ?Don't give up, even if you start smoking again. It takes most people a few tries before they succeed.  What if I am pregnant and I smoke? If you are pregnant, it's really important for the health of your baby that you quit. Ask your doctor what options you have, and what is safest for your baby

## 2022-12-28 NOTE — Progress Notes (Signed)
Anesthesia Chart Review: Stacy Fernandez  Case: [id number] Date/Time: 12/29/22 1415   Procedures:      NASOLACRIMAL DUCT EXPLORATION OF RIGHT LOWER LID, NASOLACRIMAL BIOPSY, PLASTIC REPAIR OF CANALICULI, NASOLACRIMAL DUCT PROBE AND STENT (Right)     RIGHT LOWER LID LESION EXCISION (Right)     RECONSTRUCTION OF RIGHT LOWER EYELID (Right)     RIGHT MEDIAL CANTHOPLASTY (Right)   Anesthesia type: General   Pre-op diagnosis: RIGHT SIDE NASOLACRIMAL DUCT OBSTRUCTION   [facility] 08 / [facility]   Surgeons: Dairl Ponder, MD       DISCUSSION: Patient is a 62 year old female scheduled for the above procedure.  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  Last cardiology office visit with Dr. Tresa Endo was on 12/08/22. He noted plans for eye surgery. Preoperative cardiology input outlined by Robin Searing, NP on 12/16/2022, "Given past medical history and time since last visit, based on ACC/AHA guidelines, Stacy Fernandez is at acceptable risk for the planned procedure without further cardiovascular testing.  Patient was contacted today and is able to complete 4 METS of activity without any difficulty.  She reports no changes in her health since her previous visit earlier this month with Dr. Tresa Endo... Per protocol patient can hold aspirin 7 days prior to procedure and should restart postprocedure when surgically safe."   She had urology follow-oup on 12/22/22 with Dr. Mahala Menghini. She was cleared to resume normal activity for ureteral sling 11/25/22. She did report UTI symptoms, so Bactrim DS was prescribed. Urine  culture + Streptococcus mitis/oralis 10,000-25,000 colonies/mL  Preoperative pulmonology evaluation on 12/28/22 by Durel Salts, MD. Per Dr. Celine Mans,  "This patient has a low risk of post-operative pulmonary complications by ARISCAT Index.  The absolute assessment of risk/benefit of the procedure is deferred to the primary team's evaluation.   Continue trelegy inhaler. Refills sent to pharmacy today.  Continue albuterol inhaler as needed.  Call us sooner if concerns about your breathing.    Agree with using patches to help you quit smoking.    Get your CT scan in November 2024 to screen for lung cancer".   She is a Stacy Fernandez. Anesthesia team to evaluate on the day of surgery.   VS:  BP Readings from Last 3 Encounters:  12/28/22 128/76  12/08/22 116/84  06/02/22 (!) 92/52   Pulse Readings from Last 3 Encounters:  12/28/22 65  12/08/22 68  06/02/22 65     PROVIDERS: April Manson, NP is PCP  Nicki Guadalajara, MD is cardiologist Cyril Mourning, MD is pulmonologist Crissie Figures, MD is urogynecologist [facility])   LABS: For day of surgery. As of 11/05/22, H/H 12.6/37.7, PLT 260, Cr 0.90, glucose 78.    PFTs 09/12/19: "ratio 65, FEV1 68%/1.86, FVC 2.86/82%, TLC 98%, DLCO 10.6/50%, no bronchodilator response"     IMAGES: CT Chest 06/08/22: IMPRESSION: 1. Lung-RADS 2, benign appearance or behavior. Continue annual screening with low-dose chest CT without contrast in 12 months. 2. Right nephrolithiasis. 3. Aortic Atherosclerosis (ICD10-I70.0) and Emphysema (ICD10-J43.9). 4. Age advanced coronary artery atherosclerosis. Recommend assessment of coronary risk factors.   MRA Head 06/10/21:  IMPRESSION:  1.  2 mm outpouching of the supraclinoid  segment of the left internal carotid artery.  This could represent either a small aneurysm or an arterial infundibulum.  It is unchanged compared to the 2016 MRI. 2.   Other large and middle size arteries appear normal.   EKG:  12/08/22: NSR   CV: CARDIAC CATH: 04/28/2019: Prox LAD to Mid LAD lesion is 30% stenosed. Mid LAD-1 lesion is 60% stenosed. Mid LAD-2 lesion is 10% stenosed. The left ventricular systolic function is normal. LV end diastolic pressure is low. 1st Diag lesion is 20% stenosed.   Single-vessel CAD with calcification of the proximal to mid LAD with 20% ostial diagonal stenosis with 30% stenosis of the LAD immediately after the diagonal vessel, 60% smooth mildly calcified stenosis of the LAD in a slight bend in the vessel proximal to the second diagonal takeoff with mild smooth 10% LAD stenosis in the mid LAD.  The LAD is a large caliber and wraps around the LV apex.   Normal left circumflex and dominant RCA.   No LV function with EF estimate at least 55%.  LVEDP 9 mm.   RECOMMENDATION: Medical therapy trial.  The patient did not tolerate isosorbide initiation secondary to precipitation migraine headaches; will initiate amlodipine 2.5 mg at bedtime and adjust beta-blocker therapy with metoprolol tartrate to 25 mg twice a day.  Increase rosuvastatin to 40 mg daily in attempt to induce plaque regression.  Smoking cessation is imperative.   Cardiac CT 02/02/2019:  1. Coronary calcium score of 514. This was 99th percentile for age and sex matched control. 2. Normal coronary origin with right dominance. 3. Moderate calcified plaque in the proximal LAD with 50-69%% stenosis. CAD RAD 3. 4. Moderate calcified plaque in the proximal OM with 50-69% stenosis. CAD RAD 3 5. FFR has been sent to evaluate LAD and OM. 6. Recommend Aggressive Risk Factor Modification.  CT FFR ANALYSIS 02/02/2019:  IMPRESSION: 1. CT FFR analysis showed a borderline hemodynamically significant reduction in flow in the distal LAD. This represents either diffuse distal disease or distal tapering of the vessel. 2. Recommend medical management and aggressive risk factor Modification.   Cardiac event monitor 01/16/2019 -  01/29/2019: Patient was in sinus rhythm with the slowest heart rate at 56 bpm at 4:47 AM on January 23, 2019 while the patient was sleeping. The fastest heart rate was sinus tachycardia at 126 bpm at 7:49 PM on January 28, 2019. There were no episodes of significant arrhythmia or pauses. During periods of sensation of fast heartbeat the patient was in sinus rhythm without ectopy. There was mild sinus arrhythmia. There were no pauses, episodes of atrial fibrillation , SVT. or VT.    Echo 01/09/2019: IMPRESSIONS   1. The left ventricle has normal systolic function with an ejection  fraction of 60-65%. The cavity size was normal. Left ventricular diastolic  parameters were normal.   2. The right ventricle has normal systolic function. The cavity was  normal. There is no increase in right ventricular wall thickness.   3. Mild thickening of the mitral valve leaflet.   4. The aortic valve is tricuspid. Mild thickening of the aortic valve.  Mild calcification of the aortic valve.     Past Medical History:  Diagnosis Date   Anxiety    Cervical spondylolysis    Cervicalgia    Chemotherapy-induced peripheral neuropathy (HCC)    FEET   Chronic migraine    neurologist--- dr Terrace Arabia;    incidently finding left supraclinoid ICA brain aneurysm, residual migraines, --  BOTOX  TX'S  by dr Terrace Arabia   Cigarette nicotine dependence    CKD (chronic kidney disease), stage III (HCC)    Continuous opioid dependence (HCC)    followed by pain clinic [facility]   COPD with emphysema [facility])    pulmonologist--- dr Vassie Loll;    advanded emphysema per CT,  pt strated back smoking ,  no oxygen;   last exacerbation 02-26-2022 admission for O2 sats 80s  d/c'd no oxygen resolved   Coronary artery disease    cardiologist--- dr Karie Schwalbe. 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  infundibulum--  residual chronic migraine   Wears dentures    upper   Wears glasses     Past Surgical History:  Procedure Laterality Date   APPENDECTOMY  1992   BREAST RECONSTRUCTION WITH PLACEMENT OF TISSUE EXPANDER AND FLEX HD (ACELLULAR HYDRATED DERMIS) Bilateral 09/25/2014   Procedure: PLACEMENT OF BILATERAL TISSUE EXPANDER AND  ACELLULAR DERMIS FOR BREAST RECONSTRUCTION ;  Surgeon: Glenna Fellows, MD;  Location: [facility];  Service: Plastics;  Laterality: Bilateral;   CHOLECYSTECTOMY  1999   COLONOSCOPY  11/2021   ESOPHAGOGASTRODUODENOSCOPY (EGD) WITH PROPOFOL  01/18/2013   LAPAROSCOPIC NISSEN FUNDOPLICATION  10/11/2000   LEFT HEART CATH AND CORONARY ANGIOGRAPHY N/A 04/28/2019   Procedure: LEFT HEART CATH AND CORONARY ANGIOGRAPHY;  Surgeon: Lennette Bihari, MD;  [facility];  Service: Cardiovascular;  Laterality: N/A;   LEFT HEART CATHETERIZATION WITH CORONARY ANGIOGRAM N/A 12/26/2012   Procedure: LEFT HEART CATHETERIZATION WITH CORONARY ANGIOGRAM;  Surgeon: Runell Gess, MD;  Location: [facility] CATH LAB;  Service: Cardiovascular;  Laterality: N/A;   Nonobstructive CAD/  40-50% ostial D1/  normal LVF, ef 60%   LIPOSUCTION WITH LIPOFILLING Bilateral 12/18/2014   Procedure: LIPOFILLING TO BILATERAL CHEST;  Surgeon: Glenna Fellows, MD;  Location: [facility];  Service: Plastics;  Laterality: Bilateral;   ORIF WRIST FRACTURE Left 06/02/2022   Procedure: OPEN REDUCTION INTERNAL FIXATION (ORIF) WRIST FRACTURE;  Surgeon: Sheral Apley, MD;  Location: [facility];  Service: Orthopedics;  Laterality: Left;   PORT-A-CATH REMOVAL Right 06/28/2015   Procedure: REMOVAL PORT-A-CATH;  Surgeon: Romie Levee, MD;  Location: [facility];  Service: General;  Laterality: Right;   PORTACATH PLACEMENT N/A 04/02/2014   Procedure: INSERTION PORT-A-CATH;  Surgeon: Claud Kelp, MD;  [facility];  Service: General;  Laterality: N/A;   REMOVAL OF BILATERAL TISSUE EXPANDERS WITH PLACEMENT OF BILATERAL BREAST IMPLANTS Bilateral 12/18/2014   Procedure: REMOVAL OF BILATERAL TISSUE EXPANDERS,PLACEMENT SILICONE IMPLANTS ;  Surgeon: Glenna Fellows, MD;  Location:  SURGERY CENTER;  Service: Government social research officer;  Laterality: Bilateral;   SIMPLE MASTECTOMY WITH AXILLARY SENTINEL NODE BIOPSY Bilateral 04/02/2014   Procedure: LEFT TOTAL MASTECTOMY WITH LEFT AXILLARY SENTINEL NODE BIOPSY, RIGHT PROPHYLACTIC MASTETCTOMY;  Surgeon: Claud Kelp, MD;  [facility];  Service: General;  Laterality: Bilateral;   TOTAL ABDOMINAL HYSTERECTOMY W/ BILATERAL SALPINGOOPHORECTOMY  1996   VESICO-VAGINAL FISTULA REPAIR N/A 07/17/2015   Procedure: REPAIR OF RECTOVAGINAL FISTULA;  Surgeon: Lazaro Arms, MD;  Location: [facility];  Service: Gynecology;  Laterality: N/A;   VIDEO BRONCHOSCOPY Bilateral 01/16/2013   Procedure: VIDEO BRONCHOSCOPY WITHOUT FLUORO;  Surgeon: Oretha Milch, MD;  Location: [facility];  Service: Cardiopulmonary;  Laterality: Bilateral;    MEDICATIONS: No current facility-administered medications for this encounter.    albuterol (PROVENTIL) (2.5 MG/3ML) 0.083% nebulizer solution   albuterol (VENTOLIN HFA) 108 (90 Base) MCG/ACT inhaler   ALPRAZolam (XANAX) 0.25 MG tablet   aspirin EC 81 MG tablet   diphenhydrAMINE HCl, Sleep,  (ZZZQUIL) 25 MG CAPS   Fluticasone-Umeclidin-Vilant (TRELEGY ELLIPTA) 100-62.5-25 MCG/ACT AEPB   HYDROcodone-acetaminophen (NORCO) 7.5-325 MG tablet   levothyroxine (SYNTHROID) 75 MCG tablet   loratadine (CLARITIN) 10 MG tablet   metoprolol tartrate (LOPRESSOR) 25 MG tablet   MYRBETRIQ 50 MG TB24 tablet   omega-3 acid ethyl esters (LOVAZA) 1 g capsule   pantoprazole (PROTONIX) 40 MG tablet   sulfamethoxazole-trimethoprim (BACTRIM DS) 800-160 MG tablet   Ubrogepant (UBRELVY) 50 MG TABS   acetaminophen (TYLENOL) 500 MG tablet   ondansetron (ZOFRAN-ODT) 4 MG disintegrating tablet   solifenacin (VESICARE) 10 MG tablet    Shonna Chock, PA-C Surgical Short Stay/[facility] Phone [phone] [facility] Phone [phone] 12/28/2022 12:44 PM

## 2022-12-28 NOTE — Anesthesia Preprocedure Evaluation (Signed)
Anesthesia Evaluation  Patient identified by MRN, date of birth, ID band Patient awake    Reviewed: Allergy & Precautions, NPO status , Patient's Chart, lab work & pertinent test results  Airway Mallampati: II  TM Distance: >3 FB Neck ROM: Full    Dental   Pulmonary shortness of breath, COPD,  COPD inhaler, Current Smoker and Patient abstained from smoking.   breath sounds clear to auscultation       Cardiovascular + CAD   Rhythm:Regular Rate:Normal     Neuro/Psych  Headaches  Neuromuscular disease    GI/Hepatic Neg liver ROS,GERD  ,,  Endo/Other  Hypothyroidism    Renal/GU CRFRenal disease     Musculoskeletal   Abdominal   Peds  Hematology  (+) Blood dyscrasia, anemia   Anesthesia Other Findings   Reproductive/Obstetrics                             Anesthesia Physical Anesthesia Plan  ASA: 3  Anesthesia Plan: General   Post-op Pain Management: Tylenol PO (pre-op)*   Induction: Intravenous  PONV Risk Score and Plan: 2 and Dexamethasone, Ondansetron and Treatment may vary due to age or medical condition  Airway Management Planned: LMA  Additional Equipment:   Intra-op Plan:   Post-operative Plan: Extubation in OR  Informed Consent: I have reviewed the patients History and Physical, chart, labs and discussed the procedure including the risks, benefits and alternatives for the proposed anesthesia with the patient or authorized representative who has indicated his/her understanding and acceptance.     Dental advisory given  Plan Discussed with: CRNA  Anesthesia Plan Comments: (PAT note written 12/28/2022 by Shonna Chock, PA-C.  )       Anesthesia Quick Evaluation

## 2022-12-28 NOTE — Progress Notes (Signed)
Stacy Fernandez    [id number]    1961-03-14  Primary Care Physician:White, Bonnell Public, NP Date of Appointment: 12/28/2022 Established Patient Visit  Chief complaint:   Chief Complaint  Patient presents with   Follow-up    Surgical clearance      HPI: Stacy Fernandez is a 62 y.o. woman with COPD, tobacco use disorder (trying to cut back.) who sees Dr. Vassie Loll  Interval Updates: Here for acute visit for pre-operative evaluation. Due to [facility]. [facility] Dr Dairl Ponder  Copd regimen  Trelegy 1 puff once daily, prn albuterol with nebulizer treatments.   Had RSV in January from grandchild but otherwise minimal to no albuterol use. Required prednisone.    She had bladder surgery and kidney stone surgery.  She also had wrist surgery last year.  No issues with anesthesia.   No fevers chills night sweats weight loss. Interested in quitting smoking.  Currently 1/2 ppd   I have reviewed the patient's family social and past medical history and updated as appropriate.   Past Medical History:  Diagnosis Date   Anxiety    Cervical spondylolysis    Cervicalgia    Chemotherapy-induced peripheral neuropathy (HCC)    FEET   Chronic migraine    neurologist--- dr Terrace Arabia;    incidently finding left supraclinoid ICA brain aneurysm, residual migraines, --  BOTOX TX'S  by dr Terrace Arabia   Cigarette nicotine dependence    CKD (chronic kidney disease), stage III (HCC)    Continuous opioid dependence (HCC)    followed by pain clinic [facility]   COPD with emphysema [facility])    pulmonologist--- dr Vassie Loll;    advanded emphysema per CT,  pt strated back smoking ,  no oxygen;   last exacerbation 02-26-2022 admission for O2 sats 80s  d/c'd no oxygen resolved   Coronary artery disease    cardiologist--- dr Karie Schwalbe. 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  infundibulum--  residual chronic migraine   Wears dentures    upper   Wears glasses     Past Surgical History:  Procedure Laterality Date   APPENDECTOMY  1992   BREAST RECONSTRUCTION WITH PLACEMENT OF TISSUE EXPANDER AND FLEX HD (ACELLULAR HYDRATED DERMIS) Bilateral 09/25/2014   Procedure: PLACEMENT OF BILATERAL TISSUE EXPANDER AND  ACELLULAR DERMIS FOR BREAST RECONSTRUCTION ;  Surgeon: Glenna Fellows, MD;  Location: [facility];  Service: Plastics;  Laterality: Bilateral;   CHOLECYSTECTOMY  1999   COLONOSCOPY  11/2021   ESOPHAGOGASTRODUODENOSCOPY (EGD) WITH PROPOFOL  01/18/2013  LAPAROSCOPIC NISSEN FUNDOPLICATION  10/11/2000   LEFT HEART CATH AND CORONARY ANGIOGRAPHY N/A 04/28/2019   Procedure: LEFT HEART CATH AND CORONARY ANGIOGRAPHY;  Surgeon: Lennette Bihari, MD;  [facility]: [facility];  Service: Cardiovascular;  Laterality: N/A;   LEFT HEART CATHETERIZATION WITH CORONARY ANGIOGRAM N/A 12/26/2012   Procedure: LEFT HEART CATHETERIZATION WITH CORONARY ANGIOGRAM;  Surgeon: Runell Gess, MD;  Location: [facility] CATH LAB;  Service: Cardiovascular;  Laterality: N/A;   Nonobstructive CAD/  40-50% ostial D1/  normal LVF, ef 60%   LIPOSUCTION WITH LIPOFILLING Bilateral 12/18/2014   Procedure:  LIPOFILLING TO BILATERAL CHEST;  Surgeon: Glenna Fellows, MD;  Location: [facility];  Service: Plastics;  Laterality: Bilateral;   ORIF WRIST FRACTURE Left 06/02/2022   Procedure: OPEN REDUCTION INTERNAL FIXATION (ORIF) WRIST FRACTURE;  Surgeon: Sheral Apley, MD;  Location: [facility];  Service: Orthopedics;  Laterality: Left;   PORT-A-CATH REMOVAL Right 06/28/2015   Procedure: REMOVAL PORT-A-CATH;  Surgeon: Romie Levee, MD;  Location: [facility];  Service: General;  Laterality: Right;   PORTACATH PLACEMENT N/A 04/02/2014   Procedure: INSERTION PORT-A-CATH;  Surgeon: Claud Kelp, MD;  Location: [facility];  Service: General;  Laterality: N/A;   REMOVAL OF BILATERAL TISSUE EXPANDERS WITH PLACEMENT OF BILATERAL BREAST IMPLANTS Bilateral 12/18/2014   Procedure: REMOVAL OF BILATERAL TISSUE EXPANDERS,PLACEMENT SILICONE IMPLANTS ;  Surgeon: Glenna Fellows, MD;  Location: [facility];  Service: Plastics;  Laterality: Bilateral;   SIMPLE MASTECTOMY WITH AXILLARY SENTINEL NODE BIOPSY Bilateral 04/02/2014   Procedure: LEFT TOTAL MASTECTOMY WITH LEFT AXILLARY SENTINEL NODE BIOPSY, RIGHT PROPHYLACTIC MASTETCTOMY;  Surgeon: Claud Kelp, MD;  Location: [facility];  Service: General;  Laterality: Bilateral;   TOTAL ABDOMINAL HYSTERECTOMY W/ BILATERAL SALPINGOOPHORECTOMY  1996   VESICO-VAGINAL FISTULA REPAIR N/A 07/17/2015   Procedure: REPAIR OF RECTOVAGINAL FISTULA;  Surgeon: Lazaro Arms, MD;  Location: [facility];  Service: Gynecology;  Laterality: N/A;   VIDEO BRONCHOSCOPY Bilateral 01/16/2013   Procedure: VIDEO BRONCHOSCOPY WITHOUT FLUORO;  Surgeon: Oretha Milch, MD;  Location: [facility];  Service: Cardiopulmonary;  Laterality: Bilateral;    Family History  Problem Relation Age of Onset   Heart failure Mother    Colon cancer Father 25       stomach cancer also in 80s   Throat cancer Brother 25       smoker   Heart attack Maternal  Grandmother    Colon cancer Paternal Grandmother 70   Cancer Paternal Grandfather        kidney and bladder   Ovarian cancer Sister 34       ovarian cancer at 77, colorectal cancer at 63   Throat cancer Brother 37       throat cancer, smoker   Breast cancer Paternal Aunt 25   Ovarian cancer Other 59       niece with ovarian cancer    Social History   Occupational History    Employer: [employer]  Tobacco Use   Smoking status: Every Day    Packs/day: 0.50    Years: 42.00    Additional pack years: 0.00    Total pack years: 21.00    Types: Cigarettes   Smokeless tobacco: Never   Tobacco comments:    Smokes 0.5 pack of cigarettes daily ARJ 12/28/22  Vaping Use   Vaping Use: Former   Devices: hx vaping/ ecig  Substance and Sexual Activity   Alcohol use: Never  Drug use: No   Sexual activity: Not Currently     Physical Exam: Blood pressure 128/76, pulse 65, temperature 97.8 F (36.6 C), temperature source Oral, height 5' 6.5" (1.689 m), weight 156 lb 3.2 oz (70.9 kg), SpO2 98 %.  Gen:      No acute distress ENT:  no nasal polyps, mucus membranes moist Lungs:    No increased respiratory effort, symmetric chest wall excursion, clear to auscultation bilaterally, no wheezes or crackles CV:         Regular rate and rhythm; no murmurs, rubs, or gallops.  No pedal edema   Data Reviewed: Imaging: I have personally reviewed the CT Chest Nov 2023 - mild emphysema. No nodules or masses.   PFTs:     Latest Ref Rng & Units 09/12/2019   11:02 AM  PFT Results  FVC-Pre L 2.86  P  FVC-Predicted Pre % 82  P  FVC-Post L 2.95  P  FVC-Predicted Post % 84  P  Pre FEV1/FVC % % 65  P  Post FEV1/FCV % % 67  P  FEV1-Pre L 1.86  P  FEV1-Predicted Pre % 68  P  FEV1-Post L 1.98  P  DLCO uncorrected ml/min/mmHg 10.63  P  DLCO UNC% % 50  P  DLVA Predicted % 54  P  TLC L 5.14  P  TLC % Predicted % 98  P  RV % Predicted % 108  P    P Preliminary result   I have personally  reviewed the patient's PFTs and mild airflow limitation.   Labs:  Lab Results  Component Value Date   WBC 9.0 02/27/2022   HGB 11.6 (L) 06/02/2022   HCT 34.0 (L) 06/02/2022   MCV 94.4 02/27/2022   PLT 235 02/27/2022   Lab Results  Component Value Date   NA 142 06/02/2022   K 4.1 06/02/2022   CO2 23 02/26/2022   GLUCOSE 79 06/02/2022   BUN 14 06/02/2022   CREATININE 0.70 06/02/2022   CALCIUM 9.1 02/26/2022   EGFR 57 (L) 04/29/2017   GFRNONAA >60 02/26/2022     Immunization status: Immunization History  Administered Date(s) Administered   Influenza Split 05/09/2017   Influenza Whole 02/18/2012   Influenza, Seasonal, Injecte, Preservative Fre 04/03/2014, 06/12/2015   Influenza,inj,Quad PF,6+ Mos 04/03/2014, 06/12/2015, 03/20/2016, 04/20/2019, 05/11/2022   Influenza,inj,quad, With Preservative 04/20/2019   Moderna Sars-Covid-2 Vaccination 09/28/2019, 10/28/2019   Pneumococcal Polysaccharide-23 04/03/2014   Polio, Unspecified 12/01/1965, 12/28/1967   Smallpox 12/28/1967   Tdap 04/13/2012   Tetanus 12/01/1965, 03/13/1966, 12/28/1967   Zoster Recombinat (Shingrix) 02/16/2019    External Records Personally Reviewed: pulmonary  Assessment:  COPD with mild airflow limitation FEV1 73% of predicted, not in exacerbation Ongoing tobacco use disorder Pre-operative pulmonary evaluation    Plan/Recommendations:  This patient has a low risk of post-operative pulmonary complications by ARISCAT Index.  The absolute assessment of risk/benefit of the procedure is deferred to the primary team's evaluation.  Continue trelegy inhaler. Refills sent to pharmacy today.  Continue albuterol inhaler as needed.  Call us sooner if concerns about your breathing.   Agree with using patches to help you quit smoking.   Get your CT scan in November 2024 to screen for lung cancer  Smoking Cessation Counseling:  1. The patient is an everyday smoker and symptomatic due to the following  condition COPD 2. The patient is currently contemplative in quitting smoking. 3. I advised patient to quit smoking. 4. We identified patient specific barriers  to change.  5. I personally spent 3 minutes counseling the patient regarding tobacco use disorder. 6. We discussed management of stress and anxiety to help with smoking cessation, when applicable. 7. We discussed nicotine replacement therapy, Wellbutrin, Chantix as possible options. 8. I advised setting a quit date. 9. Follow?up arranged with our office to continue ongoing discussions. 10.Resources given to patient including quit hotline.    Preoperative Risk Calculation: The features of this patient's history that contribute to the pulmonary risk assessment include: Age, COPD, General anesthesia, throacic surgery, surgery >2 hours.  This patient has a low risk of post-operative pulmonary complications by ARISCAT Index.  The absolute assessment of risk/benefit of the procedure is deferred to the primary team's evaluation.  - Patient's Estimated risk of postoperative respiratory failure is 3 points, 1.6% based on the ARISCAT Index.   0 to 25 points: Low risk: 1.6% pulmonary complication rate  26 to 44 points: Intermediate risk: 13.3% pulmonary complication rate  45 to 123 points: High risk: 42.1% pulmonary complication rate  Postoperative respiratory failure (PRF) is considered as failure to wean from mechanical ventilation within 48 hours of surgery or unplanned intubation/reintubation postoperatively. The validated risk calculator provides a risk estimate of PRF and is anticipated to aid in surgical decision-making and informed patient consent.  However risk can be accepted given the potential benefit of this intervention and it is not prohibitive.  RECOMMENDATIONS:  In order to minimize the risk of complications and optimize pulmonary status, we recommend the following: - PFT, Lung volumes and DLCO - Encourage aggressive  incentive spirometry hourly both peri-operatively and post-operatively as tolerated  - Early ambulation and physical therapy as tolerated post-operatively - Adequate pain control especially in the setting of abdominal and thoracic surgery - Bronchodilators as needed for wheezing or shortness of breath - Ideally we recommend smoking cessation for >4 weeks before any intervention - Intraoperatively keep OR time to the shortest as possible - Post operatively may benefit from Nocturnal Bipap   ARISCAT: Mazo et al. Anesthesiology 2014; [phone]    Return to Care: Return in about 1 year (around 12/28/2023). With Dr. Elmyra Ricks, MD Pulmonary and Critical Care Medicine [facility] Office:[phone]

## 2022-12-28 NOTE — Telephone Encounter (Signed)
Pt is seeing Dr Celine Mans today for clearance  Please advise when your note is done so we can fax over to [facility]

## 2022-12-28 NOTE — Telephone Encounter (Signed)
We are first waiting on Dr. Tresa Endo to give his input ok to hold ASA x 10 days prior. As far as returning the form back to the requesting office, ALL information is obtained and placed into our pre op format for the pre op APP and cardiologist.   I will update requesting office. I will also forward back to pre op APP and Dr. Tresa Endo as procedure is tomorrow. Looks to be that we have given clearance 12/16/22, though surgeon is asking for 10 days as to 7 day hold of ASA.

## 2022-12-28 NOTE — Pre-Procedure Instructions (Signed)
PCP - April Manson, NP Cardiologist - Lennette Bihari, MD  Chest x-ray - 02/26/22 EKG - 12/08/22 ECHO - 01/09/19 Cardiac Cath - 04/28/19  Aspirin Instructions: Last dose 12/19/22   Anesthesia review: Y  Patient verbally denies any shortness of breath, fever, cough and chest pain during phone call   -------------  SDW INSTRUCTIONS given:  Your procedure is scheduled on Tuesday, June 11th.  Report to [facility] "[facility]" at [facility], and check in at the Admitting office.  Call this number if you have problems the morning of surgery:  [phone]   Remember:  Do not eat after midnight the night before your surgery  You may drink clear liquids until 1130 the morning of your surgery.   Clear liquids allowed are: Water, Non-Citrus Juices (without pulp), Carbonated Beverages, Clear Tea, Black Coffee Only, and Gatorade    Take these medicines the morning of surgery with A SIP OF WATER  ALPRAZolam (XANAX)  TRELEGY  levothyroxine (SYNTHROID)  loratadine (CLARITIN)  metoprolol tartrate (LOPRESSOR)  MYRBETRIQ  BACTRIM  albuterol (PROVENTIL)-if needed albuterol (VENTOLIN HFA)-if needed (Please bring on the day of surgery) HYDROcodone-acetaminophen (NORCO)-if needed UBRELVY-if needed  As of today, STOP taking any Aspirin (unless otherwise instructed by your surgeon) Aleve, Naproxen, Ibuprofen, Motrin, Advil, Goody's, BC's, all herbal medications, fish oil, and all vitamins.                      Do not wear jewelry, make up, or nail polish            Do not wear lotions, powders, perfumes/colognes, or deodorant.            Do not shave 48 hours prior to surgery.  Men may shave face and neck.            Do not bring valuables to the hospital.            [facility] is not responsible for any belongings or valuables.  Do NOT Smoke (Tobacco/Vaping) 24 hours prior to your procedure If you use a CPAP at night, you may bring all equipment for your overnight stay.   Contacts,  glasses, dentures or bridgework may not be worn into surgery.      For patients admitted to the hospital, discharge time will be determined by your treatment team.   Patients discharged the day of surgery will not be allowed to drive home, and someone needs to stay with them for 24 hours.    Special instructions:   [place]- Preparing For Surgery  Before surgery, you can play an important role. Because skin is not sterile, your skin needs to be as free of germs as possible. You can reduce the number of germs on your skin by washing with CHG (chlorahexidine gluconate) Soap before surgery.  CHG is an antiseptic cleaner which kills germs and bonds with the skin to continue killing germs even after washing.    Oral Hygiene is also important to reduce your risk of infection.  Remember - BRUSH YOUR TEETH THE MORNING OF SURGERY WITH YOUR REGULAR TOOTHPASTE  Please do not use if you have an allergy to CHG or antibacterial soaps. If your skin becomes reddened/irritated stop using the CHG.  Do not shave (including legs and underarms) for at least 48 hours prior to first CHG shower. It is OK to shave your face.  Please follow these instructions carefully.   Shower the [employer] [facility] and the  MORNING OF SURGERY with DIAL Soap.   Pat yourself dry with a CLEAN TOWEL.  Wear CLEAN PAJAMAS to bed the night before surgery  Place CLEAN SHEETS on your bed the night of your first shower and DO NOT SLEEP WITH PETS.   Day of Surgery: Please shower morning of surgery  Wear Clean/Comfortable clothing the morning of surgery Do not apply any deodorants/lotions.   Remember to brush your teeth WITH YOUR REGULAR TOOTHPASTE.   Questions were answered. Patient verbalized understanding of instructions.

## 2022-12-28 NOTE — Telephone Encounter (Signed)
ATC X1 LVM for Deonna advising clearance has been faxed over. Will keep encounter open for now

## 2022-12-28 NOTE — Telephone Encounter (Signed)
[employer]. Need answer ASAP. Advised I would let Triage know as a High Priority since sx is tomorrow. Please reply by phone to :   Shawnee Knapp PA @ [facility]  Direct: [phone]  If w/a PT please leave a message with answer and call back info

## 2022-12-28 NOTE — H&P (View-Only) (Signed)
Anesthesia Chart Review: SAME DAY WORK-UP  Case: [id number] Date/Time: 12/29/22 1415   Procedures:      NASOLACRIMAL DUCT EXPLORATION OF RIGHT LOWER LID, NASOLACRIMAL BIOPSY, PLASTIC REPAIR OF CANALICULI, NASOLACRIMAL DUCT PROBE AND STENT (Right)     RIGHT LOWER LID LESION EXCISION (Right)     RECONSTRUCTION OF RIGHT LOWER EYELID (Right)     RIGHT MEDIAL CANTHOPLASTY (Right)   Anesthesia type: General   Pre-op diagnosis: RIGHT SIDE NASOLACRIMAL DUCT OBSTRUCTION   [facility]OM 08 / [facility]   Surgeons: Leonard, Anthony, MD       DISCUSSION: Patient is a 62 year old female scheduled for the above procedure.  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  Last cardiology office visit with Dr. Kelly was on 12/08/22. He noted plans for eye surgery. Preoperative cardiology input outlined by Dick, Ernest, NP on 12/16/2022, "Given past medical history and time since last visit, based on ACC/AHA guidelines, Stacy Fernandez is at acceptable risk for the planned procedure without further cardiovascular testing.  Patient was contacted today and is able to complete 4 METS of activity without any difficulty.  She reports no changes in her health since her previous visit earlier this month with Dr. Kelly... Per protocol patient can hold aspirin 7 days prior to procedure and should restart postprocedure when surgically safe."   She had urology follow-oup on 12/22/22 with Dr. Bastawros. She was cleared to resume normal activity for ureteral sling 11/25/22. She did report UTI symptoms, so Bactrim DS was prescribed. Urine  culture + Streptococcus mitis/oralis 10,000-25,000 colonies/mL  Preoperative pulmonology evaluation on 12/28/22 by Desai, Nikita, MD. Per Dr. Desai,  "This patient has a low risk of post-operative pulmonary complications by ARISCAT Index.  The absolute assessment of risk/benefit of the procedure is deferred to the primary team's evaluation.   Continue trelegy inhaler. Refills sent to pharmacy today.  Continue albuterol inhaler as needed.  Call us sooner if concerns about your breathing.    Agree with using patches to help you quit smoking.    Get your CT scan in November 2024 to screen for lung cancer".   She is a same day work-up. Anesthesia team to evaluate on the day of surgery.   VS:  BP Readings from Last 3 Encounters:  12/28/22 128/76  12/08/22 116/84  06/02/22 (!) 92/52   Pulse Readings from Last 3 Encounters:  12/28/22 65  12/08/22 68  06/02/22 65     PROVIDERS: White, Marsha L, NP is PCP  Kelly, Thomas, MD is cardiologist Alva, Rakesh, MD is pulmonologist Bastawros, Dina, MD is urogynecologist ([facility])   LABS: For day of surgery. As of 11/05/22, H/H 12.6/37.7, PLT 260, Cr 0.90, glucose 78.    PFTs 09/12/19: "ratio 65, FEV1 68%/1.86, FVC 2.86/82%, TLC 98%, DLCO 10.6/50%, no bronchodilator response"     IMAGES: CT Chest 06/08/22: IMPRESSION: 1. Lung-RADS 2, benign appearance or behavior. Continue annual screening with low-dose chest CT without contrast in 12 months. 2. Right nephrolithiasis. 3. Aortic Atherosclerosis (ICD10-I70.0) and Emphysema (ICD10-J43.9). 4. Age advanced coronary artery atherosclerosis. Recommend assessment of coronary risk factors.   MRA Head 06/10/21:  IMPRESSION:  1.  2 mm outpouching of the supraclinoid   segment of the left internal carotid artery.  This could represent either a small aneurysm or an arterial infundibulum.  It is unchanged compared to the 2016 MRI. 2.   Other large and middle size arteries appear normal.   EKG:  12/08/22: NSR   CV: CARDIAC CATH: 04/28/2019: Prox LAD to Mid LAD lesion is 30% stenosed. Mid LAD-1 lesion is 60% stenosed. Mid LAD-2 lesion is 10% stenosed. The left ventricular systolic function is normal. LV end diastolic pressure is low. 1st Diag lesion is 20% stenosed.   Single-vessel CAD with calcification of the proximal to mid LAD with 20% ostial diagonal stenosis with 30% stenosis of the LAD immediately after the diagonal vessel, 60% smooth mildly calcified stenosis of the LAD in a slight bend in the vessel proximal to the second diagonal takeoff with mild smooth 10% LAD stenosis in the mid LAD.  The LAD is a large caliber and wraps around the LV apex.   Normal left circumflex and dominant RCA.   No LV function with EF estimate at least 55%.  LVEDP 9 mm.   RECOMMENDATION: Medical therapy trial.  The patient did not tolerate isosorbide initiation secondary to precipitation migraine headaches; will initiate amlodipine 2.5 mg at bedtime and adjust beta-blocker therapy with metoprolol tartrate to 25 mg twice a day.  Increase rosuvastatin to 40 mg daily in attempt to induce plaque regression.  Smoking cessation is imperative.   Cardiac CT 02/02/2019:  1. Coronary calcium score of 514. This was 99th percentile for age and sex matched control. 2. Normal coronary origin with right dominance. 3. Moderate calcified plaque in the proximal LAD with 50-69%% stenosis. CAD RAD 3. 4. Moderate calcified plaque in the proximal OM with 50-69% stenosis. CAD RAD 3 5. FFR has been sent to evaluate LAD and OM. 6. Recommend Aggressive Risk Factor Modification.  CT FFR ANALYSIS 02/02/2019:  IMPRESSION: 1. CT FFR analysis showed a borderline hemodynamically significant reduction in flow in the distal LAD. This represents either diffuse distal disease or distal tapering of the vessel. 2. Recommend medical management and aggressive risk factor Modification.   Cardiac event monitor 01/16/2019 -  01/29/2019: Patient was in sinus rhythm with the slowest heart rate at 56 bpm at 4:47 AM on January 23, 2019 while the patient was sleeping. The fastest heart rate was sinus tachycardia at 126 bpm at 7:49 PM on January 28, 2019. There were no episodes of significant arrhythmia or pauses. During periods of sensation of fast heartbeat the patient was in sinus rhythm without ectopy. There was mild sinus arrhythmia. There were no pauses, episodes of atrial fibrillation , SVT. or VT.    Echo 01/09/2019: IMPRESSIONS   1. The left ventricle has normal systolic function with an ejection  fraction of 60-65%. The cavity size was normal. Left ventricular diastolic  parameters were normal.   2. The right ventricle has normal systolic function. The cavity was  normal. There is no increase in right ventricular wall thickness.   3. Mild thickening of the mitral valve leaflet.   4. The aortic valve is tricuspid. Mild thickening of the aortic valve.  Mild calcification of the aortic valve.     Past Medical History:  Diagnosis Date   Anxiety    Cervical spondylolysis    Cervicalgia    Chemotherapy-induced peripheral neuropathy (HCC)    FEET   Chronic migraine    neurologist--- dr yan;    incidently finding left supraclinoid ICA brain aneurysm, residual migraines, --  BOTOX   TX'S  by dr yan   Cigarette nicotine dependence    CKD (chronic kidney disease), stage III (HCC)    Continuous opioid dependence (HCC)    followed by pain clinic [facility]   COPD with emphysema (HCC)    pulmonologist--- dr alva;    advanded emphysema per CT,  pt strated back smoking ,  no oxygen;   last exacerbation 02-26-2022 admission for O2 sats 80s  d/c'd no oxygen resolved   Coronary artery disease    cardiologist--- dr t. 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  infundibulum--  residual chronic migraine   Wears dentures    upper   Wears glasses     Past Surgical History:  Procedure Laterality Date   APPENDECTOMY  1992   BREAST RECONSTRUCTION WITH PLACEMENT OF TISSUE EXPANDER AND FLEX HD (ACELLULAR HYDRATED DERMIS) Bilateral 09/25/2014   Procedure: PLACEMENT OF BILATERAL TISSUE EXPANDER AND  ACELLULAR DERMIS FOR BREAST RECONSTRUCTION ;  Surgeon: Brinda Thimmappa, MD;  Location: [facility];  Service: Plastics;  Laterality: Bilateral;   CHOLECYSTECTOMY  1999   COLONOSCOPY  11/2021   ESOPHAGOGASTRODUODENOSCOPY (EGD) WITH PROPOFOL  01/18/2013   LAPAROSCOPIC NISSEN FUNDOPLICATION  10/11/2000   LEFT HEART CATH AND CORONARY ANGIOGRAPHY N/A 04/28/2019   Procedure: LEFT HEART CATH AND CORONARY ANGIOGRAPHY;  Surgeon: Kelly, Thomas A, MD;  [facility];  Service: Cardiovascular;  Laterality: N/A;   LEFT HEART CATHETERIZATION WITH CORONARY ANGIOGRAM N/A 12/26/2012   Procedure: LEFT HEART CATHETERIZATION WITH CORONARY ANGIOGRAM;  Surgeon: Jonathan J Berry, MD;  [facility]: [facility];  Service: Cardiovascular;  Laterality: N/A;   Nonobstructive CAD/  40-50% ostial D1/  normal LVF, ef 60%   LIPOSUCTION WITH LIPOFILLING Bilateral 12/18/2014   Procedure: LIPOFILLING TO BILATERAL CHEST;  Surgeon: Brinda Thimmappa, MD;  Location: [facility];  Service: Plastics;  Laterality: Bilateral;   ORIF WRIST FRACTURE Left 06/02/2022   Procedure: OPEN REDUCTION INTERNAL FIXATION (ORIF) WRIST FRACTURE;  Surgeon: Murphy, Timothy D, MD;  Location: [facility];  Service: Orthopedics;  Laterality: Left;   PORT-A-CATH REMOVAL Right 06/28/2015   Procedure: REMOVAL PORT-A-CATH;  Surgeon: Alicia Thomas, MD;  Location: [facility];  Service: General;  Laterality: Right;   PORTACATH PLACEMENT N/A 04/02/2014   Procedure: INSERTION PORT-A-CATH;  Surgeon: Haywood Ingram, MD;  [facility];  Service: General;  Laterality: N/A;   REMOVAL OF BILATERAL TISSUE EXPANDERS WITH PLACEMENT OF BILATERAL BREAST IMPLANTS Bilateral 12/18/2014   Procedure: REMOVAL OF BILATERAL TISSUE EXPANDERS,PLACEMENT SILICONE IMPLANTS ;  Surgeon: Brinda Thimmappa, MD;  Location: [facility];    Service: Plastics;  Laterality: Bilateral;   SIMPLE MASTECTOMY WITH AXILLARY SENTINEL NODE BIOPSY Bilateral 04/02/2014   Procedure: LEFT TOTAL MASTECTOMY WITH LEFT AXILLARY SENTINEL NODE BIOPSY, RIGHT PROPHYLACTIC MASTETCTOMY;  Surgeon: Haywood Ingram, MD;  Location[facility];  Service: General;  Laterality: Bilateral;   TOTAL ABDOMINAL HYSTERECTOMY W/ BILATERAL SALPINGOOPHORECTOMY  1996   VESICO-VAGINAL FISTULA REPAIR N/A 07/17/2015   Procedure: REPAIR OF RECTOVAGINAL FISTULA;  Surgeon: Luther H Eure, MD;  Location: [facility];  Service: Gynecology;  Laterality: N/A;   VIDEO BRONCHOSCOPY Bilateral 01/16/2013   Procedure: VIDEO BRONCHOSCOPY WITHOUT FLUORO;  Surgeon: Rakesh V Alva, MD;  Location: [facility];  Service: Cardiopulmonary;  Laterality: Bilateral;    MEDICATIONS: No current facility-administered medications for this encounter.    albuterol (PROVENTIL) (2.5 MG/3ML) 0.083% nebulizer solution   albuterol (VENTOLIN HFA) 108 (90 Base) MCG/ACT inhaler   ALPRAZolam (XANAX) 0.25 MG tablet   aspirin EC 81 MG tablet   diphenhydrAMINE HCl, Sleep,  (ZZZQUIL) 25 MG CAPS   Fluticasone-Umeclidin-Vilant (TRELEGY ELLIPTA) 100-62.5-25 MCG/ACT AEPB   HYDROcodone-acetaminophen (NORCO) 7.5-325 MG tablet   levothyroxine (SYNTHROID) 75 MCG tablet   loratadine (CLARITIN) 10 MG tablet   metoprolol tartrate (LOPRESSOR) 25 MG tablet   MYRBETRIQ 50 MG TB24 tablet   omega-3 acid ethyl esters (LOVAZA) 1 g capsule   pantoprazole (PROTONIX) 40 MG tablet   sulfamethoxazole-trimethoprim (BACTRIM DS) 800-160 MG tablet   Ubrogepant (UBRELVY) 50 MG TABS   acetaminophen (TYLENOL) 500 MG tablet   ondansetron (ZOFRAN-ODT) 4 MG disintegrating tablet   solifenacin (VESICARE) 10 MG tablet    Jessia Kief, PA-C Surgical Short Stay/Anesthesiology [facility] Phone [phone] [facility] Phone [phone] 12/28/2022 12:44 PM         

## 2022-12-28 NOTE — Telephone Encounter (Signed)
Office calling to f/u on Clearance as pt is schedule for procedure tomorrow. Office also wants to make provider aware that pt held ASA on her own accord for 10 days. Office asking for pt to restart ASA on 6/14. Please advise

## 2022-12-28 NOTE — Telephone Encounter (Signed)
Still waiting on Dr. Ellin Goodie input. Will notify requesting office asa we receive an answer

## 2022-12-28 NOTE — Telephone Encounter (Signed)
Caller following-up on holding patient's aspirin as surgery is tomorrow.  Caller stated they need their form faxed back.

## 2022-12-29 ENCOUNTER — Ambulatory Visit (HOSPITAL_COMMUNITY)
Admission: RE | Admit: 2022-12-29 | Discharge: 2022-12-29 | Disposition: A | Discharge status: Home / Self Care | Payer: BC Managed Care – PPO | Attending: Optometry | Admitting: Optometry

## 2022-12-29 ENCOUNTER — Encounter (HOSPITAL_COMMUNITY)
Admission: RE | Disposition: A | Discharge status: Home / Self Care | Payer: Self-pay | Source: Home / Self Care | Attending: Optometry

## 2022-12-29 ENCOUNTER — Other Ambulatory Visit: Payer: Self-pay

## 2022-12-29 ENCOUNTER — Encounter (HOSPITAL_COMMUNITY): Payer: Self-pay | Admitting: Optometry

## 2022-12-29 ENCOUNTER — Ambulatory Visit (HOSPITAL_COMMUNITY): Payer: BC Managed Care – PPO | Admitting: Vascular Surgery

## 2022-12-29 DIAGNOSIS — H019 Unspecified inflammation of eyelid: Secondary | ICD-10-CM | POA: Diagnosis not present

## 2022-12-29 DIAGNOSIS — H04201 Unspecified epiphora, right lacrimal gland: Secondary | ICD-10-CM | POA: Diagnosis not present

## 2022-12-29 DIAGNOSIS — F17200 Nicotine dependence, unspecified, uncomplicated: Secondary | ICD-10-CM | POA: Diagnosis not present

## 2022-12-29 DIAGNOSIS — I2510 Atherosclerotic heart disease of native coronary artery without angina pectoris: Secondary | ICD-10-CM | POA: Diagnosis not present

## 2022-12-29 DIAGNOSIS — D23112 Other benign neoplasm of skin of right lower eyelid, including canthus: Secondary | ICD-10-CM | POA: Diagnosis not present

## 2022-12-29 DIAGNOSIS — H04551 Acquired stenosis of right nasolacrimal duct: Secondary | ICD-10-CM | POA: Diagnosis not present

## 2022-12-29 DIAGNOSIS — J449 Chronic obstructive pulmonary disease, unspecified: Secondary | ICD-10-CM | POA: Diagnosis not present

## 2022-12-29 DIAGNOSIS — D4989 Neoplasm of unspecified behavior of other specified sites: Secondary | ICD-10-CM | POA: Diagnosis not present

## 2022-12-29 HISTORY — PX: LID LESION EXCISION: SHX5204

## 2022-12-29 HISTORY — DX: Personal history of urinary calculi: Z87.442

## 2022-12-29 HISTORY — PX: LACRIMAL TUBE INSERTION: SHX1905

## 2022-12-29 HISTORY — DX: COVID-19: U07.1

## 2022-12-29 HISTORY — PX: RECONSTRUCTION OF EYELID: SHX6576

## 2022-12-29 HISTORY — DX: Dyspnea, unspecified: R06.00

## 2022-12-29 HISTORY — PX: CANTHOPLASTY: SHX1286

## 2022-12-29 HISTORY — DX: Pneumonia, unspecified organism: J18.9

## 2022-12-29 HISTORY — PX: LACRIMAL DUCT EXPLORATION: SHX6569

## 2022-12-29 LAB — POCT I-STAT, CHEM 8
BUN: 18 mg/dL (ref 8–23)
Calcium, Ion: 1.15 mmol/L (ref 1.15–1.40)
Chloride: 105 mmol/L (ref 98–111)
Creatinine, Ser: 1.1 mg/dL — ABNORMAL HIGH (ref 0.44–1.00)
Glucose, Bld: 73 mg/dL (ref 70–99)
HCT: 38 % (ref 36.0–46.0)
Hemoglobin: 12.9 g/dL (ref 12.0–15.0)
Potassium: 3.8 mmol/L (ref 3.5–5.1)
Sodium: 137 mmol/L (ref 135–145)
TCO2: 23 mmol/L (ref 22–32)

## 2022-12-29 SURGERY — EXPLORATION, LACRIMAL DUCT
Anesthesia: General | Laterality: Right

## 2022-12-29 MED ORDER — TETRACAINE HCL 0.5 % OP SOLN
OPHTHALMIC | Status: DC | PRN
  Administered 2022-12-29: 1 [drp] via OPHTHALMIC

## 2022-12-29 MED ORDER — ACETAMINOPHEN 500 MG PO TABS: 1000.0000 mg | ORAL_TABLET | Freq: Four times a day (QID) | ORAL | Status: DC

## 2022-12-29 MED ORDER — EPHEDRINE SULFATE-NACL 50-0.9 MG/10ML-% IV SOSY
PREFILLED_SYRINGE | INTRAVENOUS | Status: DC | PRN
  Administered 2022-12-29 (×2): 2.5 mg via INTRAVENOUS
  Administered 2022-12-29: 10 mg via INTRAVENOUS

## 2022-12-29 MED ORDER — TETRACAINE HCL 0.5 % OP SOLN
OPHTHALMIC | Status: AC
  Filled 2022-12-29: qty 4

## 2022-12-29 MED ORDER — MIDAZOLAM HCL 2 MG/2ML IJ SOLN
INTRAMUSCULAR | Status: AC
  Filled 2022-12-29: qty 2

## 2022-12-29 MED ORDER — ERYTHROMYCIN 5 MG/GM OP OINT
TOPICAL_OINTMENT | OPHTHALMIC | Status: AC
  Filled 2022-12-29: qty 3.5

## 2022-12-29 MED ORDER — ACETAMINOPHEN 500 MG PO TABS
ORAL_TABLET | ORAL | Status: AC
  Filled 2022-12-29: qty 2

## 2022-12-29 MED ORDER — BUPIVACAINE HCL (PF) 0.75 % IJ SOLN
INTRAMUSCULAR | Status: DC | PRN
  Administered 2022-12-29: 5 mL

## 2022-12-29 MED ORDER — PROPOFOL 10 MG/ML IV BOLUS
INTRAVENOUS | Status: AC
  Filled 2022-12-29: qty 20

## 2022-12-29 MED ORDER — NEOMYCIN-POLYMYXIN-DEXAMETH 3.5-10000-0.1 OP SUSP
1.0000 [drp] | Freq: Once | OPHTHALMIC | Status: AC
  Administered 2022-12-29: 1 [drp] via OPHTHALMIC
  Filled 2022-12-29: qty 5

## 2022-12-29 MED ORDER — SODIUM CHLORIDE 0.9 % IV SOLN: 250.0000 mL | INTRAVENOUS | Status: DC | PRN

## 2022-12-29 MED ORDER — CHLORHEXIDINE GLUCONATE 0.12 % MT SOLN
OROMUCOSAL | Status: AC
  Filled 2022-12-29: qty 15

## 2022-12-29 MED ORDER — BSS IO SOLN
INTRAOCULAR | Status: AC
  Filled 2022-12-29: qty 15

## 2022-12-29 MED ORDER — OXYMETAZOLINE HCL 0.05 % NA SOLN
NASAL | Status: AC
  Filled 2022-12-29: qty 30

## 2022-12-29 MED ORDER — LACTATED RINGERS IV SOLN
INTRAVENOUS | Status: DC
Start: 2022-12-29 — End: 2022-12-29

## 2022-12-29 MED ORDER — FENTANYL CITRATE (PF) 100 MCG/2ML IJ SOLN: 25.0000 ug | INTRAMUSCULAR | Status: DC | PRN

## 2022-12-29 MED ORDER — ONDANSETRON HCL 4 MG/2ML IJ SOLN
INTRAMUSCULAR | Status: AC
  Filled 2022-12-29: qty 2

## 2022-12-29 MED ORDER — CHLORHEXIDINE GLUCONATE 0.12 % MT SOLN
15.0000 mL | Freq: Once | OROMUCOSAL | Status: AC
  Administered 2022-12-29: 15 mL via OROMUCOSAL

## 2022-12-29 MED ORDER — ONDANSETRON HCL 4 MG/2ML IJ SOLN
INTRAMUSCULAR | Status: DC | PRN
  Administered 2022-12-29: 4 mg via INTRAVENOUS

## 2022-12-29 MED ORDER — ORAL CARE MOUTH RINSE: 15.0000 mL | Freq: Once | OROMUCOSAL | Status: AC

## 2022-12-29 MED ORDER — OXYCODONE HCL 5 MG PO TABS: 5.0000 mg | ORAL_TABLET | ORAL | Status: DC | PRN

## 2022-12-29 MED ORDER — PHENYLEPHRINE 80 MCG/ML (10ML) SYRINGE FOR IV PUSH (FOR BLOOD PRESSURE SUPPORT)
PREFILLED_SYRINGE | INTRAVENOUS | Status: DC | PRN
  Administered 2022-12-29: 80 ug via INTRAVENOUS
  Administered 2022-12-29: 120 ug via INTRAVENOUS
  Administered 2022-12-29: 80 ug via INTRAVENOUS

## 2022-12-29 MED ORDER — SODIUM CHLORIDE 0.9% FLUSH: 3.0000 mL | Freq: Two times a day (BID) | INTRAVENOUS | Status: DC

## 2022-12-29 MED ORDER — OXYMETAZOLINE HCL 0.05 % NA SOLN
1.0000 | NASAL | Status: AC
  Administered 2022-12-29 (×3): 1 via NASAL

## 2022-12-29 MED ORDER — SODIUM CHLORIDE 0.9% FLUSH: 3.0000 mL | INTRAVENOUS | Status: DC | PRN

## 2022-12-29 MED ORDER — LIDOCAINE 2% (20 MG/ML) 5 ML SYRINGE
INTRAMUSCULAR | Status: AC
  Filled 2022-12-29: qty 5

## 2022-12-29 MED ORDER — ACETAMINOPHEN 500 MG PO TABS
1000.0000 mg | ORAL_TABLET | Freq: Once | ORAL | Status: AC
  Administered 2022-12-29: 1000 mg via ORAL

## 2022-12-29 MED ORDER — BUPIVACAINE HCL (PF) 0.75 % IJ SOLN
INTRAMUSCULAR | Status: AC
  Filled 2022-12-29: qty 20

## 2022-12-29 MED ORDER — SODIUM CHLORIDE 0.9 % IV SOLN: INTRAVENOUS | Status: DC

## 2022-12-29 MED ORDER — PROPOFOL 10 MG/ML IV BOLUS
INTRAVENOUS | Status: DC | PRN
  Administered 2022-12-29: 150 mg via INTRAVENOUS

## 2022-12-29 MED ORDER — KETOROLAC TROMETHAMINE 15 MG/ML IJ SOLN: 15.0000 mg | Freq: Four times a day (QID) | INTRAMUSCULAR | Status: DC

## 2022-12-29 MED ORDER — ACETAMINOPHEN 650 MG RE SUPP: 650.0000 mg | RECTAL | Status: DC | PRN

## 2022-12-29 MED ORDER — 0.9 % SODIUM CHLORIDE (POUR BTL) OPTIME
TOPICAL | Status: DC | PRN
  Administered 2022-12-29: 1000 mL

## 2022-12-29 MED ORDER — FENTANYL CITRATE (PF) 250 MCG/5ML IJ SOLN
INTRAMUSCULAR | Status: AC
  Filled 2022-12-29: qty 5

## 2022-12-29 MED ORDER — LIDOCAINE-EPINEPHRINE 1 %-1:100000 IJ SOLN
INTRAMUSCULAR | Status: AC
  Filled 2022-12-29: qty 1

## 2022-12-29 MED ORDER — AMISULPRIDE (ANTIEMETIC) 5 MG/2ML IV SOLN: 10.0000 mg | Freq: Once | INTRAVENOUS | Status: DC | PRN

## 2022-12-29 MED ORDER — ERYTHROMYCIN 5 MG/GM OP OINT
TOPICAL_OINTMENT | Freq: Once | OPHTHALMIC | Status: AC
  Filled 2022-12-29: qty 3.5

## 2022-12-29 MED ORDER — OXYMETAZOLINE HCL 0.05 % NA SOLN
NASAL | Status: DC | PRN
  Administered 2022-12-29: 1

## 2022-12-29 MED ORDER — FENTANYL CITRATE (PF) 250 MCG/5ML IJ SOLN
INTRAMUSCULAR | Status: DC | PRN
  Administered 2022-12-29: 50 ug via INTRAVENOUS

## 2022-12-29 MED ORDER — LIDOCAINE 2% (20 MG/ML) 5 ML SYRINGE
INTRAMUSCULAR | Status: DC | PRN
  Administered 2022-12-29: 60 mg via INTRAVENOUS

## 2022-12-29 MED ORDER — ACETAMINOPHEN 325 MG PO TABS: 650.0000 mg | ORAL_TABLET | ORAL | Status: DC | PRN

## 2022-12-29 MED ORDER — MIDAZOLAM HCL 2 MG/2ML IJ SOLN
INTRAMUSCULAR | Status: DC | PRN
  Administered 2022-12-29: 2 mg via INTRAVENOUS

## 2022-12-29 MED ORDER — LIDOCAINE-EPINEPHRINE 1 %-1:100000 IJ SOLN
INTRAMUSCULAR | Status: DC | PRN
  Administered 2022-12-29: 5 mL

## 2022-12-29 MED ORDER — BSS IO SOLN
INTRAOCULAR | Status: DC | PRN
  Administered 2022-12-29: 15 mL via INTRAOCULAR

## 2022-12-29 SURGICAL SUPPLY — 73 items
ANTIFOG SOL W/FOAM PAD STRL (MISCELLANEOUS)
APL SKNCLS STERI-STRIP NONHPOA (GAUZE/BANDAGES/DRESSINGS)
APL SRG 3 HI ABS STRL LF PLS (MISCELLANEOUS)
APL SWBSTK 6 STRL LF DISP (MISCELLANEOUS) ×1
APPLICATOR COTTON TIP 6 STRL (MISCELLANEOUS) ×1 IMPLANT
APPLICATOR COTTON TIP 6IN STRL (MISCELLANEOUS) ×1
APPLICATOR DR MATTHEWS STRL (MISCELLANEOUS) IMPLANT
BAG COUNTER SPONGE SURGICOUNT (BAG) ×1 IMPLANT
BAG SPNG CNTER NS LX DISP (BAG) ×1
BENZOIN TINCTURE PRP APPL 2/3 (GAUZE/BANDAGES/DRESSINGS) IMPLANT
BLADE SURG 15 STRL LF DISP TIS (BLADE) ×1 IMPLANT
BLADE SURG 15 STRL SS (BLADE) ×1
BNDG EYE OVAL 2 1/8 X 2 5/8 (GAUZE/BANDAGES/DRESSINGS) IMPLANT
BUR DIAMOND COARSE 3.0 (BURR) IMPLANT
BUR TAPER CHOANAL ATRESIA 30K (BURR) IMPLANT
CLSR STERI-STRIP ANTIMIC 1/2X4 (GAUZE/BANDAGES/DRESSINGS) IMPLANT
COAG SUCTION FOOTSWITCH 10FR (SUCTIONS) IMPLANT
COAGULATOR SUCT 8FR VV (MISCELLANEOUS) IMPLANT
CORD BIPOLAR FORCEPS 12FT (ELECTRODE) ×1 IMPLANT
COVER SURGICAL LIGHT HANDLE (MISCELLANEOUS) ×1 IMPLANT
DRAPE ORTHO SPLIT 77X108 STRL (DRAPES) ×1
DRAPE SURG ORHT 6 SPLT 77X108 (DRAPES) ×1 IMPLANT
DRSG MEPITEL 3X4 ME34 (GAUZE/BANDAGES/DRESSINGS) IMPLANT
DRSG TELFA 3X8 NADH STRL (GAUZE/BANDAGES/DRESSINGS) IMPLANT
ELECT NDL BLADE 2-5/6 (NEEDLE) IMPLANT
ELECT NEEDLE BLADE 2-5/6 (NEEDLE) IMPLANT
ELECT REM PT RETURN 9FT ADLT (ELECTROSURGICAL) ×1
ELECTRODE REM PT RTRN 9FT ADLT (ELECTROSURGICAL) ×1 IMPLANT
FORCEPS BIPOLAR SPETZLER 8 1.0 (NEUROSURGERY SUPPLIES) ×1 IMPLANT
FRAME EYE SHIELD (PROTECTIVE WEAR) IMPLANT
GAUZE 4X4 16PLY ~~LOC~~+RFID DBL (SPONGE) ×1 IMPLANT
GAUZE SPONGE 4X4 12PLY STRL (GAUZE/BANDAGES/DRESSINGS) IMPLANT
GLOVE SS BIOGEL STRL SZ 7.5 (GLOVE) ×1 IMPLANT
GOWN STRL REUS W/ TWL LRG LVL3 (GOWN DISPOSABLE) ×2 IMPLANT
GOWN STRL REUS W/TWL LRG LVL3 (GOWN DISPOSABLE) ×2
KIT BASIN OR (CUSTOM PROCEDURE TRAY) ×1 IMPLANT
KIT SUCTION CATH 14FR (SUCTIONS) ×1 IMPLANT
KNIFE CRESCENT 1.75 EDGEAHEAD (BLADE) ×1 IMPLANT
MARKER SKIN DUAL TIP RULER LAB (MISCELLANEOUS) ×1 IMPLANT
NDL PRECISIONGLIDE 27X1.5 (NEEDLE) ×1 IMPLANT
NEEDLE PRECISIONGLIDE 27X1.5 (NEEDLE) ×1 IMPLANT
NS IRRIG 1000ML POUR BTL (IV SOLUTION) ×1 IMPLANT
PACK CATARACT CUSTOM (CUSTOM PROCEDURE TRAY) ×2 IMPLANT
PAD ARMBOARD 7.5X6 YLW CONV (MISCELLANEOUS) ×2 IMPLANT
PATTIES SURGICAL .5 X3 (DISPOSABLE) ×1 IMPLANT
PENCIL BUTTON HOLSTER BLD 10FT (ELECTRODE) IMPLANT
PROTECTOR CORNEAL (OPHTHALMIC RELATED) IMPLANT
SET INTBT LACRIMAL .016X.025 (DRAIN) ×1 IMPLANT
SOLUTION ANTFG W/FOAM PAD STRL (MISCELLANEOUS) IMPLANT
SPONGE SURGIFOAM ABS GEL 12-7 (HEMOSTASIS) ×1 IMPLANT
SUT CHROMIC 4 0 S 4 (SUTURE) IMPLANT
SUT CHROMIC 5 0 P 3 (SUTURE) IMPLANT
SUT ETHILON 6 0 P 1 (SUTURE) IMPLANT
SUT ETHILON 7 0 P 6 18 (SUTURE) IMPLANT
SUT GUT PLAIN 6-0 1X18 ABS (SUTURE) IMPLANT
SUT MERSILENE 5 0 RD 1 DA (SUTURE) IMPLANT
SUT PLAIN 5 0 P 3 18 (SUTURE) IMPLANT
SUT PLAIN 6 0 TG1408 (SUTURE) IMPLANT
SUT PROLENE 6 0 CC (SUTURE) IMPLANT
SUT PROLENE 6 0 P 1 18 (SUTURE) ×1 IMPLANT
SUT PROLENE 6 0 P 3 18 (SUTURE) IMPLANT
SUT SILK 4 0 P 3 (SUTURE) IMPLANT
SUT VIC AB 4-0 P2 18 (SUTURE) IMPLANT
SUT VICRYL 6 0 P 1 18 (SUTURE) ×1 IMPLANT
SUT VICRYL 6 0 S 14 UNDY (SUTURE) IMPLANT
SUT VICRYL 6 0 S 29 12 (SUTURE) IMPLANT
SUT VICRYL 6-0 S14 (SUTURE) IMPLANT
TOWEL GREEN STERILE FF (TOWEL DISPOSABLE) ×2 IMPLANT
TREPHINE OPHTH SISLER 21GA (BLADE) IMPLANT
TUBE CONNECTING 12X1/4 (SUCTIONS) ×1 IMPLANT
TUBING EXTENTION W/L.L. (IV SETS) ×1 IMPLANT
TUBING STRAIGHTSHOT EPS 5PK (TUBING) IMPLANT
WATER STERILE IRR 1000ML POUR (IV SOLUTION) ×1 IMPLANT

## 2022-12-29 NOTE — Anesthesia Procedure Notes (Signed)
Procedure Name: LMA Insertion Date/Time: 12/29/2022 1:22 PM  Performed by: Gus Puma, CRNAPre-anesthesia Checklist: Patient identified, Emergency Drugs available, Suction available and Patient being monitored Patient Re-evaluated:Patient Re-evaluated prior to induction Oxygen Delivery Method: Circle System Utilized Preoxygenation: Pre-oxygenation with 100% oxygen Induction Type: IV induction Ventilation: Mask ventilation without difficulty LMA: LMA inserted LMA Size: 4.0 Number of attempts: 1 Airway Equipment and Method: Bite block Placement Confirmation: positive ETCO2 Tube secured with: Tape Dental Injury: Teeth and Oropharynx as per pre-operative assessment

## 2022-12-29 NOTE — Telephone Encounter (Signed)
Okay to hold aspirin for 10 days prior to upcoming eye surgery

## 2022-12-29 NOTE — Interval H&P Note (Signed)
History and Physical Interval Note:  12/29/2022 12:25 PM  Stacy Fernandez  has presented today for surgery, with the diagnosis of RIGHT SIDE NASOLACRIMAL DUCT OBSTRUCTION.  The various methods of treatment have been discussed with the patient and family. After consideration of risks, benefits and other options for treatment, the patient has consented to  Procedure(s): NASOLACRIMAL DUCT EXPLORATION OF RIGHT LOWER LID, NASOLACRIMAL BIOPSY, PLASTIC REPAIR OF CANALICULI, NASOLACRIMAL DUCT PROBE AND STENT (Right) RIGHT LOWER LID LESION EXCISION (Right) RECONSTRUCTION OF RIGHT LOWER EYELID (Right) RIGHT MEDIAL CANTHOPLASTY (Right) as a surgical intervention.  The patient's history has been reviewed, patient examined, no change in status, stable for surgery.  I have reviewed the patient's chart and labs.  Questions were answered to the patient's satisfaction.     Dairl Ponder

## 2022-12-29 NOTE — Transfer of Care (Signed)
Immediate Anesthesia Transfer of Care Note  Patient: Stacy Fernandez  Procedure(s) Performed: NASOLACRIMAL DUCT EXPLORATION OF RIGHT LOWER LID, NASOLACRIMAL BIOPSY (Right) RIGHT LOWER LID LESION EXCISION (Right) RECONSTRUCTION OF RIGHT LOWER EYELID (Right) RIGHT MEDIAL CANTHOPLASTY (Right) CRAWFORD STENT INSERTION (Right)  Patient Location: PACU  Anesthesia Type:General  Level of Consciousness: drowsy and patient cooperative  Airway & Oxygen Therapy: Patient Spontanous Breathing  Post-op Assessment: Report given to RN and Post -op Vital signs reviewed and stable  Post vital signs: Reviewed and stable  Last Vitals:  Vitals Value Taken Time  BP 100/68 12/29/22 1418  Temp    Pulse 74 12/29/22 1421  Resp 12 12/29/22 1421  SpO2 99 % 12/29/22 1421  Vitals shown include unvalidated device data.  Last Pain:  Vitals:   12/29/22 1247  TempSrc:   PainSc: 0-No pain         Complications: No notable events documented.

## 2022-12-29 NOTE — Telephone Encounter (Signed)
   Patient Name: Stacy Fernandez  DOB: 08/05/1960 MRN: [id number]  Primary Cardiologist: Nicki Guadalajara, MD  Chart reviewed as part of pre-operative protocol coverage. Pre-op clearance already addressed by colleagues in earlier phone notes. To summarize recommendations:  -Okay to hold aspirin for 10 days prior to upcoming eye surgery  -Dr. Tresa Endo  Will route this bundled recommendation to requesting provider via Epic fax function and remove from pre-op pool. Please call with questions.  Sharlene Dory, PA-C 12/29/2022, 10:40 AM

## 2022-12-29 NOTE — Telephone Encounter (Signed)
Clearance re-sent yesterday. Confirmed receiving. Closing encounter. NFN

## 2022-12-29 NOTE — Op Note (Signed)
Operative Note PATIENT NAME:  Stacy Fernandez [facility] MRN:  [id number] CSN: [id number] DATE OF SERVICE:  12/29/2022 DATE OF BIRTH:  [birth year]  PREOPERATIVE DIAGNOSIS:   1. Nasolacrimal duct obstruction, right 2. Epiphora, right 3. Papilloma, right lower eyelid and conjunctiva 4. Canalicular obstruction, right lower eyelid  POSTOPERATIVE DIAGNOSIS:  same  PROCEDURE(S) PERFORMED:  1. Excision of eyelid lesion with biopsy, right lower eyelid 78295 Reconstruction of right lower eyelid by: 2. Adjacent tissue transfer 0.5 square cm 14060 3. Medial canthoplasty, right 67950 4. Conjunctivoplasty with extensive rearrangement and reconstruction of cul-de sac, right lower eyelid 62130 5. Repair of full thickness marginal eyelid wound, right lower eyelid, 0.5 cm 67935 6. Nasolacrimal duct probe with placement of Crawford stent, right CPT [phone]  SURGEON:  Dairl Ponder, MD, PhD ASSISTANT:  none  ANESTHESIA:  General  FLUIDS GIVEN: Per Anesthesia   ESTIMATED BLOOD LOSS:  < 5 cc  TUBES/DRAINS:  Crawford nasolacrimal stent, right   SPECIMENS/CULTURES: none   COMPLICATIONS:  None  DESCRIPTION OF PROCEDURE: After obtaining informed consent, the patient was taken back to the operating room and laid supine on the operating room table.  Under monitored anesthesia care, the operative site(s) were anesthetized with 2% lidocaine with epinephrine and bupivicaine.  The nares were packed with Afrin-soaked pledgets.  The patient was then prepped and draped in the usual sterile fashion.   A lesion spanning the right lower eyelid canaliculus from punctum to canthus on both the cutaneous and conjunctival surface was encountered.  It was excised using a 15 blade and Westcott scissors and submitted to pathology as a permanent specimen. Hemostasis was obtained with bipolar cautery. After removal, a skin defect measuring 0.3 x 0.7 cm and a conjunctival defect measuring 0.5 x 0.7  cm was observed.  This defect was continuous from the conjunctival fornix to the lid margin and did not involve the canaliculus.  The right lower eyelid punctum was dilated and a Bowman probe passed medially to confirm that the resection did not involve the canaliculus.  The upper and lower canaliculi were then dilated and a bicanalicular Crawford stent was retrieved in the nose.  A cotton tip was placed underneath the loop of the stent to protect the canaliculi.  Tension was placed on the Crawford stents and then the stents pulled to adequate tension and tied.  The stents were then trimmed inside the nasal antrum. A drop of Maxitrol was placed in the operative eye. Ointment was placed in the operative eye(s) and incision.   The conjunctival fornix was repaired using a conjunctival advancement flap.  The conjunctiva immediately lateral to the lesion was incised below the tarsus and at the fornix horizontally for approximately 1 cm.  The lower lid retractors and subconjunctival tissues were undermined.  Bipolar cautery was used for hemostasis.  This conjunctival flap was then advanced medially to the medial edge of the defect and secured using interrupted 6-0 plain gut suture. The remnant of the anterior arm of the medial canthal tendon and skin were advanced horizontally and closed using 6-0 Plain gut suture.    The patient was then taken to the recovery room in stable condition.  A drop of Maxitrol and erythromycin ointment were applied to the wounds.  Dairl Ponder, MD, PhD Ophthalmic Plastic and Reconstructive Surgery [facility] [phone] (office)

## 2022-12-29 NOTE — H&P (Addendum)
I have reviewed the H&P from Dr. Celine Mans on 12/28/22.  Patient remains appropriate for planned surgical procedure today.  Stacy Ponder, MD, PhD Ophthalmic Plastic and Reconstructive Surgery [facility] [phone] (office)

## 2022-12-30 ENCOUNTER — Encounter (HOSPITAL_COMMUNITY): Payer: Self-pay | Admitting: Optometry

## 2022-12-30 NOTE — Anesthesia Postprocedure Evaluation (Signed)
Anesthesia Post Note  Patient: Stacy Fernandez  Procedure(s) Performed: NASOLACRIMAL DUCT EXPLORATION OF RIGHT LOWER LID, NASOLACRIMAL BIOPSY (Right) RIGHT LOWER LID LESION EXCISION (Right) RECONSTRUCTION OF RIGHT LOWER EYELID (Right) RIGHT MEDIAL CANTHOPLASTY (Right) CRAWFORD STENT INSERTION (Right)     Patient location during evaluation: PACU Anesthesia Type: General Level of consciousness: awake and alert Pain management: pain level controlled Vital Signs Assessment: post-procedure vital signs reviewed and stable Respiratory status: spontaneous breathing, nonlabored ventilation, respiratory function stable and patient connected to nasal cannula oxygen Cardiovascular status: blood pressure returned to baseline and stable Postop Assessment: no apparent nausea or vomiting Anesthetic complications: no   No notable events documented.  Last Vitals:  Vitals:   12/29/22 1430 12/29/22 1445  BP: 114/75 120/69  Pulse: 69 69  Resp: 15 15  Temp:  (!) 36.1 C  SpO2: 100% 97%    Last Pain:  Vitals:   12/29/22 1445  TempSrc:   PainSc: 0-No pain                 Kennieth Rad

## 2023-01-01 LAB — SURGICAL PATHOLOGY

## 2023-01-14 DIAGNOSIS — Z87440 Personal history of urinary (tract) infections: Secondary | ICD-10-CM | POA: Diagnosis not present

## 2023-01-14 DIAGNOSIS — R339 Retention of urine, unspecified: Secondary | ICD-10-CM | POA: Diagnosis not present

## 2023-01-14 DIAGNOSIS — N3281 Overactive bladder: Secondary | ICD-10-CM | POA: Diagnosis not present

## 2023-02-04 DIAGNOSIS — Z171 Estrogen receptor negative status [ER-]: Secondary | ICD-10-CM | POA: Diagnosis not present

## 2023-02-04 DIAGNOSIS — C50412 Malignant neoplasm of upper-outer quadrant of left female breast: Secondary | ICD-10-CM | POA: Diagnosis not present

## 2023-02-04 DIAGNOSIS — I671 Cerebral aneurysm, nonruptured: Secondary | ICD-10-CM | POA: Diagnosis not present

## 2023-02-04 DIAGNOSIS — T83511D Infection and inflammatory reaction due to indwelling urethral catheter, subsequent encounter: Secondary | ICD-10-CM | POA: Diagnosis not present

## 2023-02-04 DIAGNOSIS — R300 Dysuria: Secondary | ICD-10-CM | POA: Diagnosis not present

## 2023-02-16 DIAGNOSIS — R339 Retention of urine, unspecified: Secondary | ICD-10-CM | POA: Diagnosis not present

## 2023-02-16 DIAGNOSIS — N3281 Overactive bladder: Secondary | ICD-10-CM | POA: Diagnosis not present

## 2023-02-16 DIAGNOSIS — Z9889 Other specified postprocedural states: Secondary | ICD-10-CM | POA: Diagnosis not present

## 2023-02-25 DIAGNOSIS — Z87442 Personal history of urinary calculi: Secondary | ICD-10-CM | POA: Diagnosis not present

## 2023-02-25 DIAGNOSIS — N281 Cyst of kidney, acquired: Secondary | ICD-10-CM | POA: Diagnosis not present

## 2023-03-18 DIAGNOSIS — M5136 Other intervertebral disc degeneration, lumbar region: Secondary | ICD-10-CM | POA: Diagnosis not present

## 2023-03-18 DIAGNOSIS — M47816 Spondylosis without myelopathy or radiculopathy, lumbar region: Secondary | ICD-10-CM | POA: Diagnosis not present

## 2023-03-18 DIAGNOSIS — M47812 Spondylosis without myelopathy or radiculopathy, cervical region: Secondary | ICD-10-CM | POA: Diagnosis not present

## 2023-03-18 DIAGNOSIS — M5416 Radiculopathy, lumbar region: Secondary | ICD-10-CM | POA: Diagnosis not present

## 2023-04-12 DIAGNOSIS — F3341 Major depressive disorder, recurrent, in partial remission: Secondary | ICD-10-CM | POA: Diagnosis not present

## 2023-04-12 DIAGNOSIS — E039 Hypothyroidism, unspecified: Secondary | ICD-10-CM | POA: Diagnosis not present

## 2023-04-12 DIAGNOSIS — I2510 Atherosclerotic heart disease of native coronary artery without angina pectoris: Secondary | ICD-10-CM | POA: Diagnosis not present

## 2023-04-12 DIAGNOSIS — R4589 Other symptoms and signs involving emotional state: Secondary | ICD-10-CM | POA: Diagnosis not present

## 2023-04-12 DIAGNOSIS — E785 Hyperlipidemia, unspecified: Secondary | ICD-10-CM | POA: Diagnosis not present

## 2023-04-12 DIAGNOSIS — Z0000 Encounter for general adult medical examination without abnormal findings: Secondary | ICD-10-CM | POA: Diagnosis not present

## 2023-04-12 DIAGNOSIS — M5136 Other intervertebral disc degeneration, lumbar region: Secondary | ICD-10-CM | POA: Diagnosis not present

## 2023-04-12 DIAGNOSIS — Z23 Encounter for immunization: Secondary | ICD-10-CM | POA: Diagnosis not present

## 2023-05-04 ENCOUNTER — Telehealth: Payer: Self-pay

## 2023-05-04 NOTE — Telephone Encounter (Signed)
Unable to complete pa as pt not seen in 2 years

## 2023-05-04 NOTE — Telephone Encounter (Signed)
*  [facility]  Prior Authorization form/request asks a question that requires your assistance. Please see the question below and advise accordingly.    Is patient still being seen by this clinic? Last visit was in 05/2021 and we have gotten and automatic renewal request for Ubrelvy.   CMM Key: [id number]

## 2023-05-18 ENCOUNTER — Ambulatory Visit (INDEPENDENT_AMBULATORY_CARE_PROVIDER_SITE_OTHER): Payer: BC Managed Care – PPO | Admitting: Pulmonary Disease

## 2023-05-18 ENCOUNTER — Encounter (HOSPITAL_BASED_OUTPATIENT_CLINIC_OR_DEPARTMENT_OTHER): Payer: Self-pay | Admitting: Pulmonary Disease

## 2023-05-18 VITALS — BP 98/62 | HR 68 | Ht 66.5 in | Wt 155.4 lb

## 2023-05-18 DIAGNOSIS — J432 Centrilobular emphysema: Secondary | ICD-10-CM

## 2023-05-18 DIAGNOSIS — Z720 Tobacco use: Secondary | ICD-10-CM | POA: Diagnosis not present

## 2023-05-18 NOTE — Assessment & Plan Note (Signed)
Refills on Trelegy and albuterol will be sent. We discussed COPD action plan and signs and symptoms of COPD exacerbation

## 2023-05-18 NOTE — Assessment & Plan Note (Signed)
Smoking cessation emphasized is the most important intervention. She will trial Wellbutrin and use a vape for a short period of time to quit if needed She is due for screening CT scan

## 2023-05-18 NOTE — Progress Notes (Signed)
Subjective:    Patient ID: Stacy Fernandez, female    DOB: [birth year], 62 y.o.   MRN: [id number]  HPI  62 yo heavy ex-smoker for FU of COPD Her sister was [name] She smoked more than 30-pack-years   PMH - s/p chemotherapy and hormonal therapy for breast cancer.   -Nissen's fundoplication in 2001  -Covid infection 10/2018 , needed  O2 x few weeks - strong F/h/o colon CA & lung/ throat CA -CAD-CT coronaries showed distal LAD blockage  Annual follow-up visit, last seen by me 09/2021. She was seen by my partner 12/2022 and cleared for eye surgery. She had uneventful year-she needed multiple surgeries including that for renal calculi, bladder tack, left wrist fracture and right eye tear duct. Breathing is at baseline, she reports a croupy cough that stays with her all the time.  She reports an a.m. cough where she has to bring up little ball of mucus before she gets going in the morning. She continues to smoke half pack per day.  She just started back on Wellbutrin.  Previously she was able to quit by using vapes  She was given the flu shot, she had RSV earlier in the year from a grandchild    Significant tests/ events reviewed   PFTs 08/2019-ratio 65, FEV1 68%/1.86, FVC 2.86/82%, TLC 98%, DLCO 10.6/50%, no bronchodilator response     LHC -no critical CAD  LDCT 05/2022 RADS 2  LDCT 05/2021 RADS -2, no nodules   CT chest in 2014 had suggested endobronchial nodule in the right mainstem, bronchoscopy was nondiagnostic, follow-up CT chest in 2016 showed resolution of these nodules, also PET scan in 06/2015 was negative   CPET 11/2012 showed mildly reduced functional status with O2 of 76% predicted.   Spirometry  11/2012 -moderate airway obstruction with FEV1 of 58% FVC of 71% and ratio 65, diffusion was 38%     Spirometry 11/2014  ratio 65, FEV1 61%, FVC 75%   Review of Systems neg for any significant sore throat, dysphagia, itching, sneezing, nasal congestion or excess/ purulent  secretions, fever, chills, sweats, unintended wt loss, pleuritic or exertional cp, hempoptysis, orthopnea pnd or change in chronic leg swelling. Also denies presyncope, palpitations, heartburn, abdominal pain, nausea, vomiting, diarrhea or change in bowel or urinary habits, dysuria,hematuria, rash, arthralgias, visual complaints, headache, numbness weakness or ataxia.     Objective:   Physical Exam  Gen. Pleasant, well-nourished, in no distress ENT - no thrush, no pallor/icterus,no post nasal drip Neck: No JVD, no thyromegaly, no carotid bruits Lungs: no use of accessory muscles, no dullness to percussion, clear without rales or rhonchi  Cardiovascular: Rhythm regular, heart sounds  normal, no murmurs or gallops, no peripheral edema Musculoskeletal: No deformities, no cyanosis or clubbing         Assessment & Plan:

## 2023-05-18 NOTE — Patient Instructions (Signed)
Good luck with your CT scan.  Try to cut down on smoking and quit!  Refills on Treleg[facility] and [facility] Please get RSV shot from pharmacy

## 2023-05-19 ENCOUNTER — Other Ambulatory Visit: Payer: Self-pay | Admitting: Pulmonary Disease

## 2023-05-20 ENCOUNTER — Telehealth (HOSPITAL_BASED_OUTPATIENT_CLINIC_OR_DEPARTMENT_OTHER): Payer: Self-pay | Admitting: Pulmonary Disease

## 2023-05-20 NOTE — Telephone Encounter (Signed)
Patient states she was able to receive albuterol refill but not her [facility] patient states only has four puffs left before she is out.  Please advise and notify when it has been called in.

## 2023-05-21 MED ORDER — TRELEGY ELLIPTA 100-62.5-25 MCG/ACT IN AEPB: 1.0000 | INHALATION_SPRAY | Freq: Every day | RESPIRATORY_TRACT | 3 refills | Status: DC

## 2023-05-21 NOTE — Telephone Encounter (Signed)
Sending to [facility] since no provider at [facility] today. Patient calling back asking about her Trelegy

## 2023-05-21 NOTE — Telephone Encounter (Signed)
Trelegy has been sent to preferred pharmacy.  Left detailed message for patient. Nothing further needed.

## 2023-06-03 DIAGNOSIS — F1120 Opioid dependence, uncomplicated: Secondary | ICD-10-CM | POA: Diagnosis not present

## 2023-06-03 DIAGNOSIS — M47812 Spondylosis without myelopathy or radiculopathy, cervical region: Secondary | ICD-10-CM | POA: Diagnosis not present

## 2023-06-03 DIAGNOSIS — G894 Chronic pain syndrome: Secondary | ICD-10-CM | POA: Diagnosis not present

## 2023-06-03 DIAGNOSIS — M5416 Radiculopathy, lumbar region: Secondary | ICD-10-CM | POA: Diagnosis not present

## 2023-06-03 DIAGNOSIS — M47816 Spondylosis without myelopathy or radiculopathy, lumbar region: Secondary | ICD-10-CM | POA: Diagnosis not present

## 2023-06-10 ENCOUNTER — Ambulatory Visit (HOSPITAL_BASED_OUTPATIENT_CLINIC_OR_DEPARTMENT_OTHER)
Admission: RE | Admit: 2023-06-10 | Discharge: 2023-06-10 | Disposition: A | Discharge status: Home / Self Care | Payer: BC Managed Care – PPO | Source: Ambulatory Visit | Attending: Acute Care | Admitting: Acute Care

## 2023-06-10 DIAGNOSIS — Z122 Encounter for screening for malignant neoplasm of respiratory organs: Secondary | ICD-10-CM | POA: Insufficient documentation

## 2023-06-10 DIAGNOSIS — Z87891 Personal history of nicotine dependence: Secondary | ICD-10-CM | POA: Insufficient documentation

## 2023-06-10 DIAGNOSIS — F17210 Nicotine dependence, cigarettes, uncomplicated: Secondary | ICD-10-CM | POA: Insufficient documentation

## 2023-07-01 ENCOUNTER — Telehealth: Payer: Self-pay | Admitting: Pharmacy Technician

## 2023-07-01 ENCOUNTER — Other Ambulatory Visit (HOSPITAL_COMMUNITY): Payer: Self-pay

## 2023-07-01 NOTE — Telephone Encounter (Signed)
Pharmacy Patient Advocate Encounter   Received notification from CoverMyMeds that prior authorization for lovaza is required/requested.   Insurance verification completed.   The patient is insured through [employer] .   Per test claim: PA required; PA submitted to above mentioned insurance via CoverMyMeds Key/confirmation #/EOC [id number] Status is pending

## 2023-07-01 NOTE — Telephone Encounter (Signed)
Pharmacy Patient Advocate Encounter  Received notification from [employer] that Prior Authorization for lovaza has been APPROVED from 06/01/23 to 06/30/24   PA #/Case ID/Reference #: [id number]

## 2023-07-02 ENCOUNTER — Other Ambulatory Visit: Payer: Self-pay | Admitting: Acute Care

## 2023-07-02 DIAGNOSIS — Z122 Encounter for screening for malignant neoplasm of respiratory organs: Secondary | ICD-10-CM

## 2023-07-02 DIAGNOSIS — Z87891 Personal history of nicotine dependence: Secondary | ICD-10-CM

## 2023-07-02 DIAGNOSIS — F17210 Nicotine dependence, cigarettes, uncomplicated: Secondary | ICD-10-CM

## 2023-07-12 DIAGNOSIS — L84 Corns and callosities: Secondary | ICD-10-CM | POA: Diagnosis not present

## 2023-07-12 DIAGNOSIS — Z1331 Encounter for screening for depression: Secondary | ICD-10-CM | POA: Diagnosis not present

## 2023-07-22 DIAGNOSIS — Q828 Other specified congenital malformations of skin: Secondary | ICD-10-CM | POA: Diagnosis not present

## 2023-07-22 DIAGNOSIS — M7741 Metatarsalgia, right foot: Secondary | ICD-10-CM | POA: Diagnosis not present

## 2023-07-22 DIAGNOSIS — M216X1 Other acquired deformities of right foot: Secondary | ICD-10-CM | POA: Diagnosis not present

## 2023-07-22 DIAGNOSIS — M79671 Pain in right foot: Secondary | ICD-10-CM | POA: Diagnosis not present

## 2023-08-06 DIAGNOSIS — Z7951 Long term (current) use of inhaled steroids: Secondary | ICD-10-CM | POA: Diagnosis not present

## 2023-08-06 DIAGNOSIS — Q828 Other specified congenital malformations of skin: Secondary | ICD-10-CM | POA: Diagnosis not present

## 2023-08-06 DIAGNOSIS — M7742 Metatarsalgia, left foot: Secondary | ICD-10-CM | POA: Diagnosis not present

## 2023-08-06 DIAGNOSIS — E785 Hyperlipidemia, unspecified: Secondary | ICD-10-CM | POA: Diagnosis not present

## 2023-08-06 DIAGNOSIS — K219 Gastro-esophageal reflux disease without esophagitis: Secondary | ICD-10-CM | POA: Diagnosis not present

## 2023-08-06 DIAGNOSIS — L565 Disseminated superficial actinic porokeratosis (DSAP): Secondary | ICD-10-CM | POA: Diagnosis not present

## 2023-08-06 DIAGNOSIS — M7741 Metatarsalgia, right foot: Secondary | ICD-10-CM | POA: Diagnosis not present

## 2023-08-06 DIAGNOSIS — Z79890 Hormone replacement therapy (postmenopausal): Secondary | ICD-10-CM | POA: Diagnosis not present

## 2023-08-06 DIAGNOSIS — J449 Chronic obstructive pulmonary disease, unspecified: Secondary | ICD-10-CM | POA: Diagnosis not present

## 2023-08-06 DIAGNOSIS — M216X2 Other acquired deformities of left foot: Secondary | ICD-10-CM | POA: Diagnosis not present

## 2023-08-06 DIAGNOSIS — Z888 Allergy status to other drugs, medicaments and biological substances status: Secondary | ICD-10-CM | POA: Diagnosis not present

## 2023-08-06 DIAGNOSIS — Z7982 Long term (current) use of aspirin: Secondary | ICD-10-CM | POA: Diagnosis not present

## 2023-08-06 DIAGNOSIS — M216X1 Other acquired deformities of right foot: Secondary | ICD-10-CM | POA: Diagnosis not present

## 2023-08-06 DIAGNOSIS — Z79899 Other long term (current) drug therapy: Secondary | ICD-10-CM | POA: Diagnosis not present

## 2023-09-02 DIAGNOSIS — M5416 Radiculopathy, lumbar region: Secondary | ICD-10-CM | POA: Diagnosis not present

## 2023-09-02 DIAGNOSIS — M47816 Spondylosis without myelopathy or radiculopathy, lumbar region: Secondary | ICD-10-CM | POA: Diagnosis not present

## 2023-09-02 DIAGNOSIS — F1120 Opioid dependence, uncomplicated: Secondary | ICD-10-CM | POA: Diagnosis not present

## 2023-09-02 DIAGNOSIS — M51369 Other intervertebral disc degeneration, lumbar region without mention of lumbar back pain or lower extremity pain: Secondary | ICD-10-CM | POA: Diagnosis not present

## 2023-10-29 ENCOUNTER — Other Ambulatory Visit: Payer: Self-pay | Admitting: Cardiovascular Disease

## 2023-11-12 DIAGNOSIS — Q828 Other specified congenital malformations of skin: Secondary | ICD-10-CM | POA: Diagnosis not present

## 2023-11-12 DIAGNOSIS — M79671 Pain in right foot: Secondary | ICD-10-CM | POA: Diagnosis not present

## 2023-11-12 DIAGNOSIS — M216X1 Other acquired deformities of right foot: Secondary | ICD-10-CM | POA: Diagnosis not present

## 2023-11-18 DIAGNOSIS — F1120 Opioid dependence, uncomplicated: Secondary | ICD-10-CM | POA: Diagnosis not present

## 2023-11-18 DIAGNOSIS — M5416 Radiculopathy, lumbar region: Secondary | ICD-10-CM | POA: Diagnosis not present

## 2023-11-18 DIAGNOSIS — M51369 Other intervertebral disc degeneration, lumbar region without mention of lumbar back pain or lower extremity pain: Secondary | ICD-10-CM | POA: Diagnosis not present

## 2023-11-18 DIAGNOSIS — M47816 Spondylosis without myelopathy or radiculopathy, lumbar region: Secondary | ICD-10-CM | POA: Diagnosis not present

## 2023-12-08 DIAGNOSIS — M5416 Radiculopathy, lumbar region: Secondary | ICD-10-CM | POA: Diagnosis not present

## 2023-12-23 DIAGNOSIS — M79671 Pain in right foot: Secondary | ICD-10-CM | POA: Diagnosis not present

## 2023-12-23 DIAGNOSIS — M216X1 Other acquired deformities of right foot: Secondary | ICD-10-CM | POA: Diagnosis not present

## 2023-12-23 DIAGNOSIS — Q828 Other specified congenital malformations of skin: Secondary | ICD-10-CM | POA: Diagnosis not present

## 2023-12-24 DIAGNOSIS — R5381 Other malaise: Secondary | ICD-10-CM | POA: Diagnosis not present

## 2023-12-24 DIAGNOSIS — R051 Acute cough: Secondary | ICD-10-CM | POA: Diagnosis not present

## 2023-12-24 DIAGNOSIS — U071 COVID-19: Secondary | ICD-10-CM | POA: Diagnosis not present

## 2024-01-12 ENCOUNTER — Ambulatory Visit: Admitting: Cardiovascular Disease

## 2024-01-13 DIAGNOSIS — M47816 Spondylosis without myelopathy or radiculopathy, lumbar region: Secondary | ICD-10-CM | POA: Diagnosis not present

## 2024-01-13 DIAGNOSIS — M51369 Other intervertebral disc degeneration, lumbar region without mention of lumbar back pain or lower extremity pain: Secondary | ICD-10-CM | POA: Diagnosis not present

## 2024-01-13 DIAGNOSIS — M5416 Radiculopathy, lumbar region: Secondary | ICD-10-CM | POA: Diagnosis not present

## 2024-01-13 DIAGNOSIS — M47812 Spondylosis without myelopathy or radiculopathy, cervical region: Secondary | ICD-10-CM | POA: Diagnosis not present

## 2024-01-15 ENCOUNTER — Other Ambulatory Visit: Payer: Self-pay | Admitting: Cardiovascular Disease

## 2024-01-27 DIAGNOSIS — M48061 Spinal stenosis, lumbar region without neurogenic claudication: Secondary | ICD-10-CM | POA: Diagnosis not present

## 2024-01-27 DIAGNOSIS — M4807 Spinal stenosis, lumbosacral region: Secondary | ICD-10-CM | POA: Diagnosis not present

## 2024-02-15 DIAGNOSIS — M47812 Spondylosis without myelopathy or radiculopathy, cervical region: Secondary | ICD-10-CM | POA: Diagnosis not present

## 2024-03-06 DIAGNOSIS — M5416 Radiculopathy, lumbar region: Secondary | ICD-10-CM | POA: Diagnosis not present

## 2024-03-15 DIAGNOSIS — M79671 Pain in right foot: Secondary | ICD-10-CM | POA: Diagnosis not present

## 2024-03-15 DIAGNOSIS — Q828 Other specified congenital malformations of skin: Secondary | ICD-10-CM | POA: Diagnosis not present

## 2024-03-15 DIAGNOSIS — M216X1 Other acquired deformities of right foot: Secondary | ICD-10-CM | POA: Diagnosis not present

## 2024-03-30 DIAGNOSIS — E039 Hypothyroidism, unspecified: Secondary | ICD-10-CM | POA: Diagnosis not present

## 2024-03-30 DIAGNOSIS — F419 Anxiety disorder, unspecified: Secondary | ICD-10-CM | POA: Diagnosis not present

## 2024-03-30 DIAGNOSIS — Z79899 Other long term (current) drug therapy: Secondary | ICD-10-CM | POA: Diagnosis not present

## 2024-03-30 DIAGNOSIS — F17210 Nicotine dependence, cigarettes, uncomplicated: Secondary | ICD-10-CM | POA: Diagnosis not present

## 2024-03-30 DIAGNOSIS — J449 Chronic obstructive pulmonary disease, unspecified: Secondary | ICD-10-CM | POA: Diagnosis not present

## 2024-03-30 DIAGNOSIS — Z7982 Long term (current) use of aspirin: Secondary | ICD-10-CM | POA: Diagnosis not present

## 2024-03-30 DIAGNOSIS — K219 Gastro-esophageal reflux disease without esophagitis: Secondary | ICD-10-CM | POA: Diagnosis not present

## 2024-03-30 DIAGNOSIS — M47892 Other spondylosis, cervical region: Secondary | ICD-10-CM | POA: Diagnosis not present

## 2024-03-30 DIAGNOSIS — Z7951 Long term (current) use of inhaled steroids: Secondary | ICD-10-CM | POA: Diagnosis not present

## 2024-03-30 DIAGNOSIS — M47812 Spondylosis without myelopathy or radiculopathy, cervical region: Secondary | ICD-10-CM | POA: Diagnosis not present

## 2024-03-30 DIAGNOSIS — I2510 Atherosclerotic heart disease of native coronary artery without angina pectoris: Secondary | ICD-10-CM | POA: Diagnosis not present

## 2024-04-13 ENCOUNTER — Encounter: Payer: Self-pay | Admitting: Physician Assistant

## 2024-04-13 ENCOUNTER — Ambulatory Visit: Attending: Physician Assistant | Admitting: Physician Assistant

## 2024-04-13 VITALS — BP 106/64 | HR 82 | Ht 66.5 in | Wt 146.4 lb

## 2024-04-13 DIAGNOSIS — I10 Essential (primary) hypertension: Secondary | ICD-10-CM

## 2024-04-13 DIAGNOSIS — E039 Hypothyroidism, unspecified: Secondary | ICD-10-CM

## 2024-04-13 DIAGNOSIS — Z720 Tobacco use: Secondary | ICD-10-CM

## 2024-04-13 DIAGNOSIS — I2510 Atherosclerotic heart disease of native coronary artery without angina pectoris: Secondary | ICD-10-CM

## 2024-04-13 DIAGNOSIS — R319 Hematuria, unspecified: Secondary | ICD-10-CM

## 2024-04-13 DIAGNOSIS — Z853 Personal history of malignant neoplasm of breast: Secondary | ICD-10-CM

## 2024-04-13 DIAGNOSIS — E782 Mixed hyperlipidemia: Secondary | ICD-10-CM

## 2024-04-13 DIAGNOSIS — R002 Palpitations: Secondary | ICD-10-CM

## 2024-04-13 DIAGNOSIS — G43709 Chronic migraine without aura, not intractable, without status migrainosus: Secondary | ICD-10-CM

## 2024-04-13 DIAGNOSIS — R079 Chest pain, unspecified: Secondary | ICD-10-CM

## 2024-04-13 MED ORDER — NITROGLYCERIN 0.4 MG SL SUBL: 0.4000 mg | SUBLINGUAL_TABLET | SUBLINGUAL | 3 refills | Status: AC | PRN

## 2024-04-13 MED ORDER — METOPROLOL TARTRATE 100 MG PO TABS: ORAL_TABLET | ORAL | 0 refills | Status: DC

## 2024-04-13 NOTE — Progress Notes (Signed)
 Cardiology Office Note   Date:  04/13/2024  ID:  Stacy, Fernandez 03-04-1961, MRN [id number] PCP: Teresa Aldona CROME, NP   HeartCare Providers Cardiologist:  Georganna Archer, MD   History of Present Illness Stacy Fernandez is a 63 y.o. female with a past medical history of tobacco use, migraine headaches, COPD/emphysema, and previous chest pain here for follow-up appointment.  Patient was initially seen in 2014 by Dr. Burnard for evaluation of chest pain.  In October 2013 a nuclear perfusion study showed normal perfusion and function.  Also history of GERD with reflux and underwent esophageal manometry.  Developed chest pain leading to [facility] long hospital presentation.  Underwent diagnostic cardiac cath with Dr. Wadie on December 25, 2012 which showed 40% ostial first diagonal branch stenosis of the LAD and all of the other vessels were normal.  EF was 60%.  Continue to smoke cigarettes was seen in follow-up.  Also has history of breast cancer.  Her husband developed COVID initially and was hospitalized at [facility] for 3 weeks and then she was admitted to [facility] long due to COVID in early April and was hospitalized for 1 week.  Discharged and 2 weeks later revealed her to be COVID-positive and it was not until May that she had a negative test.  Due to the 2 to 4-week period of increased palpitations as well as some vague discomfort in her chest such as tightness that she was referred for telemedicine evaluation January 05, 2019 with Dr. Burnard.  During that evaluation she states she is on levothyroxine  which dose had been reduced from 175 down to 125.  Recommended that she has an event monitor for 2 weeks.  Initiated therapy with metoprolol  to tartrate 12.5 mg twice a day.  Since her blood pressure was unknown, did not adjust her dose.  Drinking 1 to 2 cups of coffee a day and discontinuation of all caffeine was recommended.  Previous cardiac catheterization as stated above.  Also scheduled for  2D echocardiogram.  Her 2D echocardiogram showed normal LVEF and normal diastolic parameters.  Mild aortic sclerosis and mild thickening of the mitral valve leaflets.  Both atria were normal in size a 2-week monitor from June 29 through January 29, 2019 was essentially unremarkable.  Predominantly normal sinus rhythm with a minimal heart rate of 56 maximal heart rate of 126.  Underwent CT coronary angiography which showed calcium  score of 514 which is 90th percentile for age and sex matched controls.  Moderate calcification in the proximal LAD as well as moderate calcification of the proximal OM.  FFR results were sent at that time.  Also struggling with somewhat low blood pressure.  Rosuvastatin  10 mg initiated for her cholesterol with initial cholesterol 202, triglycerides 228, LDL 117, HDL 39.  FFR was essentially negative and medical therapy was continued at that time.  September 2020 she was experiencing occasional palpitations admitted to episodes of chest pressure.  Ultimately, cardiac catheterization was performed which revealed probably single-vessel CAD with calcification of the proximal and mid LAD and 20% ostial diagonal stenosis, 30% stenosis of the LAD immediately after the diagonal vessel and 6% smooth mildly calcified stenosis of the mid LAD and a slight bend in the vessel proximal to the second diagonal takeoff with mild 10% LAD stenosis beyond.  LAD was larger caliber and wraps around the apex.  Circumflex and dominant RCA were normal.  Following catheterization we initiated amlodipine  2.5 mg at bedtime.  Did not tolerate isosorbide  mononitrate  initiation due to migraine headaches.  Adjusted beta-blocker therapy to 25 twice daily and increase rosuvastatin  40 mg daily and attempt to reduce plaque.  Strongly advised smoking cessation.  Telemedicine visit April 2021 further titration of metoprolol  to tartrate to 37.5 mg twice a day.  She was seen again in the following year for evaluation and  sleeping much better.  No chest pain or palpitations at that time.  She was seen last year and denied any anginal symptoms.  Continue to her current medications.  Was on levothyroxine  75 mcg for hypothyroidism.  Also on as needed albuterol , Trelegy elliptica, Singulair , and ProAir  for her lungs.  Unfortunately was still smoking half pack a day.  Was on Lovaza  1 capsule twice a day.  Underwent [facility] surgery at [facility] shortly after that visit, preop clearance was obtained.  Today, she presents with coronary artery disease who presents with chest tightness and difficulty breathing.  She began experiencing episodes of chest tightness around 11 AM yesterday, described as 'real, real tight', with associated dyspnea. Occasionally, she experiences a shooting pain. These symptoms persisted throughout the day yesterday and have occurred a few times today. The episodes are intermittent, subsiding with rest, such as when she had to stop while shopping. She has never experienced these symptoms before. No palpitations unless physically active.  She has coronary artery disease with a previous cardiac catheterization in 2020 showing 60% stenosis in the mid LAD, 30% in another area, and 20% in one of the branches. She was previously on 40 mg of rosuvastatin  but is currently taking 10 mg. She takes 81 mg aspirin  daily. Her previous lipid panel showed elevated LDL and triglycerides.  She smokes six to eight cigarettes a day, reduced from a previous pack a day, and does not smoke indoors. She has a history of emphysema and uses albuterol  and Trelegy inhalers, as well as an albuterol  nebulizer as needed.  No edema, orthopnea, PND. Reports no palpitations.   Discussed the use of AI scribe software for clinical note transcription with the patient, who gave verbal consent to proceed.   ROS: Pertinent ROS in HPI  Studies Reviewed EKG Interpretation Date/Time:  Thursday April 13 2024 15:18:29  EDT Ventricular Rate:  82 PR Interval:  142 QRS Duration:  94 QT Interval:  378 QTC Calculation: 441 R Axis:   90  Text Interpretation: Normal sinus rhythm Rightward axis Incomplete right bundle branch block When compared with ECG of 22-Feb-2022 18:43, PREVIOUS ECG IS PRESENT Confirmed by Lucien Blanc [id number]) on 04/13/2024 4:58:36 PM   Cardiac catheterization 04/28/2019  Left Anterior Descending  Prox LAD to Mid LAD lesion is 30% stenosed.  Mid LAD-1 lesion is 60% stenosed.  Mid LAD-2 lesion is 10% stenosed.    First Diagonal Branch  1st Diag lesion is 20% stenosed.    Intervention   No interventions have been documented.   Wall Motion  Resting                Left Heart  Left Ventricle The left ventricular size is normal. The left ventricular systolic function is normal. LV end diastolic pressure is low. There is preserved global LV contractility with EF estimate at least 55%.  LVEDP is 9 mmHg.   Coronary Diagrams  Diagnostic Dominance: Right   Physical Exam VS:  BP 106/64   Pulse 82   Ht 5' 6.5 (1.689 m)   Wt 146 lb 6.4 oz (66.4 kg)   SpO2 97%   BMI 23.28  kg/m        Wt Readings from Last 3 Encounters:  04/13/24 146 lb 6.4 oz (66.4 kg)  05/18/23 155 lb 6.4 oz (70.5 kg)  12/29/22 156 lb (70.8 kg)    GEN: Well nourished, well developed in no acute distress NECK: No JVD; No carotid bruits CARDIAC: RRR, no murmurs, rubs, gallops RESPIRATORY:  Clear to auscultation without rales, wheezing or rhonchi  ABDOMEN: Soft, non-tender, non-distended EXTREMITIES:  No edema; No deformity   ASSESSMENT AND PLAN  Coronary artery disease with prior LAD stenosis and exertional chest pain (possible angina) Intermittent exertional chest pain suggests possible angina and progression of coronary artery disease. History of 60% LAD stenosis noted in 2020. No testing since 2020, symptoms concerning for cardiac issues. - Order coronary CTA to assess coronary artery status,  particularly LAD stenosis. - nitroglycerin  sent in but patient cannot take due to migraines   Mixed hyperlipidemia Lipid panel showed elevated LDL at 147 mg/dL and triglycerides at 795 mg/dL. Current LDL goal is less than 70 mg/dL. Current rosuvastatin  dosage may be insufficient. - Order fasting lipid panel to reassess lipid levels. - Discuss potential adjustment of rosuvastatin  dosage based on lipid panel results.  Essential hypertension Blood pressure management ongoing with metoprolol . Heart rate was 82, may require adjustment during CTA.  Chronic obstructive pulmonary disease (emphysema) COPD management includes inhalers and nebulizer. Smoking reduced to 6-8 cigarettes per day. Continued cessation crucial for health improvement. - Continue current inhaler and nebulizer regimen. - Encourage further smoking cessation efforts.  Tobacco use Currently smoking 6-8 cigarettes per day, reduced from a pack per day. Smoking cessation crucial for health improvement. - Encourage complete smoking cessation.  Chronic low back pain with prior lumbar interventions Ongoing pain despite multiple interventions. Recent nerve ablation performed with uncertain efficacy.  Chronic migraine Chronic migraines with extensive prior treatment. Current management includes as-needed medication. She reports not wanting to repeat previous extensive treatment. - Continue current migraine management plan.       Dispo: She can follow-up in 6 month to establish care with MD  Signed, Orren LOISE Fabry, PA-C

## 2024-04-13 NOTE — Patient Instructions (Signed)
 Medication Instructions:  Your physician recommends that you continue on your current medications as directed. Please refer to the Current Medication list given to you today.  *If you need a refill on your cardiac medications before your next appointment, please call your pharmacy*  Lab Work: Today-Lipids If you have labs (blood work) drawn today and your tests are completely normal, you will receive your results only by: MyChart Message (if you have MyChart) OR A paper copy in the mail If you have any lab test that is abnormal or we need to change your treatment, we will call you to review the results.  Testing/Procedures:   Your cardiac CT will be scheduled at one of the below locations:   [facility] [address]  [place], [place] [address] [phone]  If scheduled at the Heart and Vascular Tower at [facility] street, please enter the parking lot using the [address] entrance and use the FREE valet service at the patient drop-off area. Enter the building and check-in with registration on the main floor.   Please follow these instructions carefully (unless otherwise directed):  An IV will be required for this test and Nitroglycerin  will be given.  Hold all erectile dysfunction medications at least 3 days (72 hrs) prior to test. (Ie viagra, cialis, sildenafil, tadalafil, etc)   On the Night Before the Test: Be sure to Drink plenty of water . Do not consume any caffeinated/decaffeinated beverages or chocolate 12 hours prior to your test. Do not take any antihistamines 12 hours prior to your test.  If the patient has contrast allergy: Patient will need a prescription for Prednisone  and very clear instructions (as follows): Prednisone  50 mg - take 13 hours prior to test Take another Prednisone  50 mg 7 hours prior to test Take another Prednisone  50 mg 1 hour prior to test Take Benadryl  50 mg 1 hour prior to test Patient must complete all four  doses of above prophylactic medications. Patient will need a ride after test due to Benadryl .  On the Day of the Test: Drink plenty of water  until 1 hour prior to the test. Do not eat any food 1 hour prior to test. You may take your regular medications prior to the test.  Take metoprolol  (Lopressor ) two hours prior to test. If you take Furosemide /Hydrochlorothiazide/Spironolactone/Chlorthalidone, please HOLD on the morning of the test. Patients who wear a continuous glucose monitor MUST remove the device prior to scanning. FEMALES- please wear underwire-free bra if available, avoid dresses & tight clothing  After the Test: Drink plenty of water . After receiving IV contrast, you may experience a mild flushed feeling. This is normal. On occasion, you may experience a mild rash up to 24 hours after the test. This is not dangerous. If this occurs, you can take Benadryl  25 mg, Zyrtec, Claritin , or Allegra and increase your fluid intake. (Patients taking Tikosyn should avoid Benadryl , and may take Zyrtec, Claritin , or Allegra) If you experience trouble breathing, this can be serious. If it is severe call 911 IMMEDIATELY. If it is mild, please call our office.  We will call to schedule your test 2-4 weeks out understanding that some insurance companies will need an authorization prior to the service being performed.   For more information and frequently asked questions, please visit our website : [email or web address]  For non-scheduling related questions, please contact the cardiac imaging nurse navigator should you have any questions/concerns: Cardiac Imaging Nurse Navigators Direct Office Dial: [phone]   For scheduling  needs, including cancellations and rescheduling, please call [place], [phone].   Follow-Up: At [facility], you and your health needs are our priority.  As part of our continuing mission to provide you with exceptional heart care, our  providers are all part of one team.  This team includes your primary Cardiologist (physician) and Advanced Practice Providers or APPs (Physician Assistants and Nurse Practitioners) who all work together to provide you with the care you need, when you need it.  Your next appointment:   6 month(s)  Provider:   Georganna Archer, MD

## 2024-04-17 ENCOUNTER — Other Ambulatory Visit: Payer: Self-pay

## 2024-04-18 MED ORDER — OMEGA-3-ACID ETHYL ESTERS 1 G PO CAPS: 1.0000 | ORAL_CAPSULE | Freq: Two times a day (BID) | ORAL | 3 refills | Status: AC

## 2024-04-19 DIAGNOSIS — E039 Hypothyroidism, unspecified: Secondary | ICD-10-CM | POA: Diagnosis not present

## 2024-04-19 DIAGNOSIS — I10 Essential (primary) hypertension: Secondary | ICD-10-CM | POA: Diagnosis not present

## 2024-04-19 DIAGNOSIS — E782 Mixed hyperlipidemia: Secondary | ICD-10-CM | POA: Diagnosis not present

## 2024-04-19 DIAGNOSIS — G43709 Chronic migraine without aura, not intractable, without status migrainosus: Secondary | ICD-10-CM | POA: Diagnosis not present

## 2024-04-19 DIAGNOSIS — I2510 Atherosclerotic heart disease of native coronary artery without angina pectoris: Secondary | ICD-10-CM | POA: Diagnosis not present

## 2024-04-20 LAB — LIPID PANEL
Chol/HDL Ratio: 2.5 ratio (ref 0.0–4.4)
Cholesterol, Total: 123 mg/dL (ref 100–199)
HDL: 50 mg/dL (ref >39)
LDL Chol Calc (NIH): 51 mg/dL (ref 0–99)
Triglycerides: 127 mg/dL (ref 0–149)
VLDL Cholesterol Cal: 22 mg/dL (ref 5–40)

## 2024-04-21 ENCOUNTER — Ambulatory Visit: Payer: Self-pay | Admitting: Physician Assistant

## 2024-04-27 ENCOUNTER — Other Ambulatory Visit: Payer: Self-pay

## 2024-04-27 ENCOUNTER — Encounter (HOSPITAL_COMMUNITY): Payer: Self-pay

## 2024-04-27 ENCOUNTER — Telehealth: Payer: Self-pay

## 2024-04-27 DIAGNOSIS — N200 Calculus of kidney: Secondary | ICD-10-CM

## 2024-04-27 DIAGNOSIS — E039 Hypothyroidism, unspecified: Secondary | ICD-10-CM

## 2024-04-27 DIAGNOSIS — C50412 Malignant neoplasm of upper-outer quadrant of left female breast: Secondary | ICD-10-CM | POA: Diagnosis not present

## 2024-04-27 DIAGNOSIS — Z171 Estrogen receptor negative status [ER-]: Secondary | ICD-10-CM

## 2024-04-27 DIAGNOSIS — R319 Hematuria, unspecified: Secondary | ICD-10-CM

## 2024-04-27 DIAGNOSIS — E782 Mixed hyperlipidemia: Secondary | ICD-10-CM

## 2024-04-27 NOTE — Telephone Encounter (Signed)
 Spoke with pt regarding BMP needed prior to her CCTA. Pt stated she would get her labs drawn after work today. Lab ordered. Pt had no further questions.

## 2024-04-28 ENCOUNTER — Ambulatory Visit: Payer: Self-pay | Admitting: Physician Assistant

## 2024-04-28 ENCOUNTER — Ambulatory Visit (HOSPITAL_COMMUNITY)
Admission: RE | Admit: 2024-04-28 | Discharge: 2024-04-28 | Disposition: A | Discharge status: Home / Self Care | Source: Ambulatory Visit | Attending: Physician Assistant | Admitting: Physician Assistant

## 2024-04-28 DIAGNOSIS — I2510 Atherosclerotic heart disease of native coronary artery without angina pectoris: Secondary | ICD-10-CM | POA: Diagnosis not present

## 2024-04-28 DIAGNOSIS — R079 Chest pain, unspecified: Secondary | ICD-10-CM | POA: Diagnosis not present

## 2024-04-28 LAB — BASIC METABOLIC PANEL WITH GFR
BUN/Creatinine Ratio: 14 (ref 12–28)
BUN: 12 mg/dL (ref 8–27)
CO2: 20 mmol/L (ref 20–29)
Calcium: 9.4 mg/dL (ref 8.7–10.3)
Chloride: 104 mmol/L (ref 96–106)
Creatinine, Ser: 0.86 mg/dL (ref 0.57–1.00)
Glucose: 93 mg/dL (ref 70–99)
Potassium: 4.2 mmol/L (ref 3.5–5.2)
Sodium: 140 mmol/L (ref 134–144)
eGFR: 76 mL/min/1.73 (ref >59)

## 2024-04-28 MED ORDER — NITROGLYCERIN 0.4 MG SL SUBL
0.8000 mg | SUBLINGUAL_TABLET | Freq: Once | SUBLINGUAL | Status: AC
  Administered 2024-04-28: 0.8 mg via SUBLINGUAL

## 2024-04-28 MED ORDER — IOHEXOL 350 MG/ML SOLN
100.0000 mL | Freq: Once | INTRAVENOUS | Status: AC | PRN
  Administered 2024-04-28: 100 mL via INTRAVENOUS

## 2024-05-25 ENCOUNTER — Other Ambulatory Visit: Payer: Self-pay | Admitting: Student in an Organized Health Care Education/Training Program

## 2024-05-26 MED ORDER — METOPROLOL TARTRATE 25 MG PO TABS: ORAL_TABLET | ORAL | 1 refills | Status: AC

## 2024-06-12 ENCOUNTER — Ambulatory Visit (HOSPITAL_BASED_OUTPATIENT_CLINIC_OR_DEPARTMENT_OTHER)
Admission: RE | Admit: 2024-06-12 | Discharge: 2024-06-12 | Disposition: A | Discharge status: Home / Self Care | Source: Ambulatory Visit | Attending: Acute Care | Admitting: Acute Care

## 2024-06-12 DIAGNOSIS — Z87891 Personal history of nicotine dependence: Secondary | ICD-10-CM | POA: Insufficient documentation

## 2024-06-12 DIAGNOSIS — F17210 Nicotine dependence, cigarettes, uncomplicated: Secondary | ICD-10-CM | POA: Diagnosis present

## 2024-06-12 DIAGNOSIS — Z122 Encounter for screening for malignant neoplasm of respiratory organs: Secondary | ICD-10-CM | POA: Diagnosis present

## 2024-06-21 ENCOUNTER — Other Ambulatory Visit: Payer: Self-pay

## 2024-06-21 DIAGNOSIS — Z87891 Personal history of nicotine dependence: Secondary | ICD-10-CM

## 2024-06-21 DIAGNOSIS — F17210 Nicotine dependence, cigarettes, uncomplicated: Secondary | ICD-10-CM

## 2024-06-21 DIAGNOSIS — Z122 Encounter for screening for malignant neoplasm of respiratory organs: Secondary | ICD-10-CM

## 2024-08-14 ENCOUNTER — Telehealth: Payer: Self-pay | Admitting: Pharmacy Technician

## 2024-08-14 NOTE — Telephone Encounter (Signed)
" °  Pharmacy Patient Advocate Encounter   Received notification from CoverMyMeds that prior authorization for omega-3 is required/requested.   Insurance verification completed.   The patient is insured through [employer].   Per test claim: PA required; PA submitted to above mentioned insurance via Latent Key/confirmation #/EOC [id number] Status is pending  "

## 2024-08-14 NOTE — Telephone Encounter (Signed)
 Pharmacy Patient Advocate Encounter  Received notification from [employer] that Prior Authorization for omega-3 has been APPROVED from 08/14/24 to 08/14/25   PA #/Case ID/Reference #: [id number]

## 2024-08-17 ENCOUNTER — Other Ambulatory Visit (HOSPITAL_BASED_OUTPATIENT_CLINIC_OR_DEPARTMENT_OTHER): Payer: Self-pay | Admitting: Pulmonary Disease

## 2024-08-17 ENCOUNTER — Other Ambulatory Visit (HOSPITAL_BASED_OUTPATIENT_CLINIC_OR_DEPARTMENT_OTHER): Payer: Self-pay

## 2024-08-17 MED ORDER — TRELEGY ELLIPTA 100-62.5-25 MCG/ACT IN AEPB: 1.0000 | INHALATION_SPRAY | Freq: Every day | RESPIRATORY_TRACT | 0 refills | Status: AC
